# Patient Record
Sex: Male | Born: 1955 | Race: Black or African American | Hispanic: No | Marital: Single | Smoking: Current every day smoker
Health system: Southern US, Community
[De-identification: ages and names within clinical notes are randomized; demographics above are authoritative.]

## PROBLEM LIST (undated history)

## (undated) DIAGNOSIS — I10 Essential (primary) hypertension: Secondary | ICD-10-CM

## (undated) DIAGNOSIS — R0600 Dyspnea, unspecified: Secondary | ICD-10-CM

## (undated) DIAGNOSIS — E785 Hyperlipidemia, unspecified: Secondary | ICD-10-CM

## (undated) HISTORY — DX: Hyperlipidemia, unspecified: E78.5

## (undated) HISTORY — DX: Essential (primary) hypertension: I10

## (undated) HISTORY — PX: SPINE SURGERY: SHX786

---

## 1998-03-29 ENCOUNTER — Emergency Department (HOSPITAL_COMMUNITY): Admission: EM | Admit: 1998-03-29 | Discharge: 1998-03-29 | Payer: Self-pay | Admitting: Emergency Medicine

## 1998-04-09 ENCOUNTER — Ambulatory Visit (HOSPITAL_COMMUNITY): Admission: RE | Admit: 1998-04-09 | Discharge: 1998-04-09 | Payer: Self-pay | Admitting: Urology

## 1998-10-30 ENCOUNTER — Encounter: Admission: RE | Admit: 1998-10-30 | Discharge: 1999-01-28 | Payer: Self-pay | Admitting: Emergency Medicine

## 2002-07-03 ENCOUNTER — Emergency Department (HOSPITAL_COMMUNITY): Admission: EM | Admit: 2002-07-03 | Discharge: 2002-07-04 | Payer: Self-pay | Admitting: Emergency Medicine

## 2004-05-08 ENCOUNTER — Inpatient Hospital Stay (HOSPITAL_COMMUNITY): Admission: EM | Admit: 2004-05-08 | Discharge: 2004-05-09 | Payer: Self-pay | Admitting: Emergency Medicine

## 2004-05-15 ENCOUNTER — Encounter: Admission: RE | Admit: 2004-05-15 | Discharge: 2004-05-15 | Payer: Self-pay | Admitting: Family Medicine

## 2005-09-05 ENCOUNTER — Ambulatory Visit: Payer: Self-pay | Admitting: Gastroenterology

## 2005-10-06 ENCOUNTER — Ambulatory Visit: Payer: Self-pay | Admitting: Gastroenterology

## 2005-10-13 ENCOUNTER — Ambulatory Visit: Payer: Self-pay | Admitting: Gastroenterology

## 2005-10-13 ENCOUNTER — Encounter (INDEPENDENT_AMBULATORY_CARE_PROVIDER_SITE_OTHER): Payer: Self-pay | Admitting: Specialist

## 2008-06-07 ENCOUNTER — Emergency Department (HOSPITAL_COMMUNITY): Admission: EM | Admit: 2008-06-07 | Discharge: 2008-06-07 | Payer: Self-pay | Admitting: Emergency Medicine

## 2008-06-16 ENCOUNTER — Encounter: Admission: RE | Admit: 2008-06-16 | Discharge: 2008-06-16 | Payer: Self-pay | Admitting: Emergency Medicine

## 2008-07-16 ENCOUNTER — Encounter: Admission: RE | Admit: 2008-07-16 | Discharge: 2008-07-16 | Payer: Self-pay | Admitting: Orthopedic Surgery

## 2008-08-02 ENCOUNTER — Encounter: Admission: RE | Admit: 2008-08-02 | Discharge: 2008-08-02 | Payer: Self-pay | Admitting: Emergency Medicine

## 2008-08-07 ENCOUNTER — Encounter: Admission: RE | Admit: 2008-08-07 | Discharge: 2008-08-07 | Payer: Self-pay | Admitting: Orthopedic Surgery

## 2008-08-08 ENCOUNTER — Encounter (INDEPENDENT_AMBULATORY_CARE_PROVIDER_SITE_OTHER): Payer: Self-pay | Admitting: Orthopedic Surgery

## 2008-08-08 ENCOUNTER — Inpatient Hospital Stay (HOSPITAL_COMMUNITY): Admission: RE | Admit: 2008-08-08 | Discharge: 2008-08-09 | Payer: Self-pay | Admitting: Orthopedic Surgery

## 2008-10-31 ENCOUNTER — Encounter: Admission: RE | Admit: 2008-10-31 | Discharge: 2008-10-31 | Payer: Self-pay | Admitting: Orthopedic Surgery

## 2008-12-01 ENCOUNTER — Encounter: Admission: RE | Admit: 2008-12-01 | Discharge: 2008-12-01 | Payer: Self-pay | Admitting: Orthopedic Surgery

## 2009-01-19 ENCOUNTER — Ambulatory Visit (HOSPITAL_BASED_OUTPATIENT_CLINIC_OR_DEPARTMENT_OTHER): Admission: RE | Admit: 2009-01-19 | Discharge: 2009-01-19 | Payer: Self-pay | Admitting: Orthopedic Surgery

## 2009-12-04 ENCOUNTER — Encounter (INDEPENDENT_AMBULATORY_CARE_PROVIDER_SITE_OTHER): Payer: Self-pay | Admitting: *Deleted

## 2011-04-07 LAB — BASIC METABOLIC PANEL
CO2: 25 mEq/L (ref 19–32)
Chloride: 107 mEq/L (ref 96–112)
GFR calc Af Amer: >60 mL/min (ref >60)
GFR calc non Af Amer: >60 mL/min (ref >60)
Potassium: 3.8 mEq/L (ref 3.5–5.1)

## 2011-04-07 LAB — GLUCOSE, CAPILLARY: Glucose-Capillary: 99 mg/dL (ref 70–99)

## 2011-05-06 NOTE — Op Note (Signed)
NAMEDIAZ, CRAGO NO.:  [id number]   MEDICAL RECORD NO.:  [id number]          PATIENT TYPE:  INP   LOCATION:  5012                         FACILITY:  [facility]   PHYSICIAN:  Nelda Severe, MD      DATE OF BIRTH:  [birth year]   DATE OF PROCEDURE:  08/08/2008  DATE OF DISCHARGE:                               OPERATIVE REPORT   SURGEON:  Nelda Severe, MD   ASSISTANT:  Lianne Cure, PA-C   PREOPERATIVE DIAGNOSIS:  Cervical myelopathy secondary to C5-C6 stenosis  and disk herniation.   POSTOPERATIVE DIAGNOSIS:  Cervical myelopathy secondary to C5-C6  stenosis and disk herniation.   OPERATIVE PROCEDURE:  Anterior cervical decompression and fusion using  PEEK cage, titanium plate, and screws with a right anterior iliac crest  graft harvest.   OPERATIVE NOTE:  The patient was placed under general endotracheal  anesthesia.  Sequential compression leggings were placed on both lower  extremities.  He was positioned with his arms at his sides supported on  armboards parallel to the table because of his immense size.  The gel  bump was placed under the shoulders and the head supported on a foam  donut.  The superior aspect of both shoulders was painted with tincture  of Benzoin.  A 4-inch adhesive tape was then used to tract on both the  shoulders and the adhesive tape was anchored to the foot of the  operating table.  A skin marker was used to mark the site of proposed  right anterior iliac crest graft harvest incision as well as the  proposed site of the cervical incision.  A 18 gauge spinal needle was  taped to the skin and a cross-table lateral radiograph taken which  showed that the mark was approximately one segment too high.   The right iliac crest was prepped with DuraPrep as was the anterior  cervical spine.  Square draping was applied and secured with Ioban.   A time-out was held in which all of the centering information was  repeated and confirmed etc.,  etc., etc.   A transverse incision was made from the midline to the anterior border  of the left sternocleidomastoid.  Dissection was carried through the  platysma with cutting current.  The anterior border of the  sternocleidomastoid muscle was identified.  We then bluntly dissected  the deep cervical fascia and then onto the anterior aspect of the spinal  column.  The C5-C6 level was identified and confirmed with a cross-table  lateral radiograph with a marker in the disk.   The anterior spondylophytes were removed as well as a large area of  heterotopic ossification in the annulus anteriorly.  The longus coli  muscles were mobilized bilaterally.  A shadow line retractor was placed.  Caspar distraction pins were then placed above and below and the  distractor applied.  The anterior disk was incised with a 15 blade.  We  then performed disk excision using various Karlin curettes and pituitary  rongeurs.   A high-speed bur was then used to bur off some of the anterior lip on  the superior vertebra (C5) in order to improve exposure.  A high-speed  bur was then used to bur posteriorly, particularly on the posterior  superior corner of C6.  There was a lot of very adherent disk tissue.  It was very difficult to separate or rasp the bone.  Ultimately, we did  and there was really no distinct plane between the posterior  longitudinal ligament and outer annulus.  We decompressed from right to  left, above and below.  We persisted with decompression until palpation  of the neural foramina on either side most consistent with there being  no residual compression.  In the course of all exposure, we had already  curetted away the endplate cartilage above and below.   Ultimately, we curettaged a 10-mm thick PEEK cage.   We then approached the anterior iliac crest on the right side, a  distance of at least 2 inches posterior to the anterior superior iliac  spine.  Insertion of the gluteal and  external oblique fascia were  removed from the superior surface of the crest using cautery.  The crest  was then fenestrated with a gouge.  Using a gouge and curettes, we  removed an adequate amount of bone to fill the cage.  The bony defect  was then packed with Gelfoam to control bleeding.   The cage was loaded with bone and gently impacted into place.  It was  countersunk minimally.  It was a lordotic cage.   Titanium plate and screws were then used to internally fix C5-C6.  Cross-  table lateral radiograph showed the correct level and satisfactory  placement of implants.  The cervical wound was then irrigated with  saline.  A 7-gauge Silastic drain was then placed in the depths of the  cervical wound and brought out through the skin medially.  The platysma  was reapposed using inverted 2-0 interrupted Vicryl sutures.  The  subcutaneous layer was not closed separately.  The skin was closed using  continuous 3-0 undyed Vicryl.  The subcutaneous tissue of the iliac  crest wound was closed using interrupted 2-0 Vicryl and the skin closed  using a running 3-0 undyed Vicryl in subcuticular fashion.  Skin edges  of both wounds were reinforced with Steri-Strips.  A gauze dressing was  placed and secured with a green towel on the cervical wound.  A  antibiotic ointment/gauze dressing was placed on the iliac crest wound  and secured with OpSite.   The patient has not been yet fully awakened at the time of dictation.  There were no intraoperative complications.  The sponge and needle  counts were correct.  Blood loss estimated at less than 200 mL.      Nelda Severe, MD  Electronically Signed     MT/MEDQ  D:  08/08/2008  T:  08/09/2008  Job:  [id number]

## 2011-05-06 NOTE — Op Note (Signed)
NAMEKENYATTA, [name]               ACCOUNT NO.:  [id number]   MEDICAL RECORD NO.:  [id number]          PATIENT TYPE:  AMB   LOCATION:  DSC                          FACILITY:  [facility]   PHYSICIAN:  Katy Fitch. Sypher, M.D. 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   POSTOPERATIVE DIAGNOSIS:  Entrapment neuropathy, median nerve left  carpal tunnel with background insulin-dependent diabetes mellitus.   OPERATION:  Release of left transverse carpal ligament.   OPERATING SURGEON:  Katy Fitch. Sypher, MD   ASSISTANT:  Marveen Reeks Dasnoit, PA-C   ANESTHESIA:  General by LMA.   SUPERVISING ANESTHESIOLOGIST:  Burna Forts, MD   INDICATIONS:  [name] is a 55 year old gentleman referred through  the courtesy of Dr. Nelda Severe and Dr. Earl Lites.  He has a history  of significant spinal arthrosis and is on chronic disability.  He has  insulin-dependent diabetes and history of hand numbness.  He was  thoroughly evaluated by Dr. Alveda Reasons and had electrodiagnostic studies  confirming bilateral carpal tunnel syndrome.   Due to a failure to respond to nonoperative measures, he is brought to  the operating room at this time for release of his left transverse  carpal ligament.   PROCEDURE IN DETAIL:  [name] is brought to the operating room and  placed in supine position upon the operating table.   Following the induction of general anesthesia by LMA technique, the left  arm was prepped with Betadine soap and solution and sterilely draped.  A  pneumatic tourniquet was applied to the proximal left brachium.  Upon  exsanguination of the left arm with Esmarch bandage, arterial tourniquet  was inflated to 220 mmHg.  Procedure commenced with short incision in  line of  the ring finger in the palm.  Subcutaneous tissues were  carefully divided up to the palmar fascia.  This was split  longitudinally toward the common sensory branch of the median nerve.  These were followed back to transverse carpal ligament which was gently  isolated from the median nerve.  The ligament was then released from its  ulnar border extending into the distal forearm.  This widely opened the  carpal canal.  Bleeding points along the margin of the ligament were not  problematic.  The carpal canal was explored.  There were fibrotic bands between the median nerve and the ulnar bursa.  These were gently released with scissors dissection.  Thereafter, the  wound was then repaired with intradermal 3-0 Prolene.  A compressive  dressing was applied.  Lidocaine 2% was infiltrated for postop  analgesia.  There were no apparent complications.      Katy Fitch Sypher, M.D.  Electronically Signed     RVS/MEDQ  D:  01/19/2009  T:  01/19/2009  Job:  [id number]   cc:   Nelda Severe, MD  Stan Head. Cleta Alberts, M.D.

## 2011-05-09 NOTE — Discharge Summary (Signed)
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      Nelda Severe, MD  Electronically Signed     MT/MEDQ  D:  09/20/2008  T:  09/21/2008  Job:  [id number]

## 2011-05-09 NOTE — Discharge Summary (Signed)
[name], [name]                           ACCOUNT NO.:  [id number]   MEDICAL RECORD NO.:  [id number]                   PATIENT TYPE:  INP   LOCATION:  5727                                 FACILITY:  [facility]   PHYSICIAN:  [name], M.D.             DATE OF BIRTH:  October 19, 1956   DATE OF ADMISSION:  05/08/2004  DATE OF DISCHARGE:  05/09/2004                                 DISCHARGE SUMMARY   DISCHARGE DIAGNOSES:  1. Diabetes mellitus.  2. Toothache secondary to tooth abscess.   DISCHARGE MEDICATIONS:  1. NPH 10 units subcutaneous in a.m. 30 minutes before breakfast and 5 units     subcutaneous in p.m. 30 minutes before dinner.  2. Metformin 850 mg p.o. daily.  3. Penicillin VK 500 mg p.o. 3 times a day for 7 days.  4. Tylenol 600 mg p.o. every 4-6 hours as necessary for toothache.   DISPOSITION:  The patient was discharged to home.   DISCHARGE INSTRUCTIONS:  1. Diet:  ADA carbohydrate-modified diabetic diet.  2. BP monitoring.  3. CBG monitoring before meals and at bedtime and record in notebook.   FOLLOWUP:  1. Follow up with Dr. Stan Head. Kane on June 04, 2004 at 10:20 a.m.  2. [facility] as scheduled.  3. Follow up with dentist of choice regarding toothache/tooth abscess.   REVIEW OF HISTORY:  This is a 55 year old Afro-American male with complaints  of right flank pain and abdominal pain and anorexia.  The patient is a known  diabetic and admitted that he has not taken his diabetes medications in the  past 9 months.   PHYSICAL EXAMINATION:  VITAL SIGNS:  On physical examination, blood pressure  was 160/110, pulse rate of 110-115, RR of 24, afebrile, O2 saturation of 95%  on room air.  GENERAL:  This is an obese, middle-aged male who ambulates with a cane but  without great difficulty.  HEART AND LUNGS:  Heart and lung findings were essentially normal.  ABDOMEN:  There was note of minimal CVA tenderness bilaterally.   There was  note of diffuse tenderness to palpation at the right upper quadrant and the  right flank with normoactive bowel sounds, with no distention of the  abdomen.  NEUROLOGIC:  Examination showed no lateralizing signs.   LABORATORY DATA ON ADMISSION:  Hemoglobin 15.7.  Sodium 131, potassium 4.8,  chloride 99, CO2 17, glucose 324, BUN 22, creatinine 1.7, total bilirubin  1.9, alkaline phosphatase 128, AST 18, ALT 20, total protein of 7.9, albumin  4.0, calcium 9.4, lipase 40.  Urinalysis showed specific gravity of 1.038,  glucose of greater than 1000, large bilirubin, more than 80 ketones, trace  blood, 30 of protein, nitrite and leukocyte esterase negative.  Urine drug  screen was positive for THC.  Serum osmolality was 289.  Serum ketones  showed small amount of acetone present.  CT without contrast of abdomen:  __________  stone.  Normal exam except for  possible diverticula noted.  No diverticulitis noted.   ABG showed a pH of 7.33, PCO2 of 28.7, bicarb of 15 and a base deficit of 9.   COURSE AT THE HOSPITAL:  PROBLEM #1 - DIABETES MELLITUS:  The patient was  given 1 L bolus of normal saline solution.  The patient was then started on  Novolog 70/30 -- 10 units x1, followed by Lantus 10 units subcutaneous in  a.m.  The patient's CBGs were fairly stable.  The patient was advised to  start NPH as instructed at home and to monitor CBGs before meals and at  bedtime.  His primary M.D. needs to adjust his insulin regimen, depending on  his CBGs.  The patient also underwent diabetic teaching and was instructed  on proper use of the insulin syringes and how to inject this insulin.   PROBLEM #2 - HYPERTENSION:  The patient does not have a history of  hypertension.  It was unclear whether this was secondary to pain.  The  patient was advised to monitor his blood pressure at home and if his blood  pressure remains persistently elevated, he is to return to his primary M.D.  so that  antihypertensive medication can be started if needed.   PROBLEM #3 - TOOTHACHE, RULE OUT ABSCESS:  The patient was started on  penicillin VK and was advised to follow up with his dentist of choice  regarding the toothache and possible tooth abscess.   PROBLEM #4 - SUBSTANCE ABUSE:  The patient was referred to the care manager  regarding substance abuse issues.   CONDITION ON DISCHARGE:  The patient was discharged with no complaints  except for a toothache which was controlled with analgesics.  He did not  complain of any abdominal pain, nausea or vomiting.  He was tolerating his  diabetic diet and was ready to go home.  Blood pressure was 153/73 with a  pulse rate of 83-102, afebrile, with O2 saturations of 95% to 98% on room  air.  Fasting blood sugar was 213.  The rest of the physical exam was  essentially normal.  The patient was advised to follow up with his primary  M.D. so that adjustments in his insulin regimen can be done, depending on  his CBG monitoring at home.      Walter Sabal, MD                         [name], M.D.    MC/MEDQ  D:  05/22/2004  T:  05/23/2004  Job:  [id number]   cc:   [name], M.D.  [address]  [place]  [place] [address]  Fax: [phone]

## 2011-05-09 NOTE — Discharge Summary (Signed)
NAMEBECKER, [name] NO.:  [id number]   MEDICAL RECORD NO.:  [id number]          PATIENT TYPE:  INP   LOCATION:  5012                         FACILITY:  [facility]   PHYSICIAN:  Nelda Severe, MD      DATE OF BIRTH:  1956/07/28   DATE OF ADMISSION:  08/08/2008  DATE OF DISCHARGE:  08/09/2008                               DISCHARGE SUMMARY   ADMITTING DIAGNOSIS:  C5-6 stenosis with myelopathy.   BRIEF HISTORY:  He was admitted to [facility] on August 08, 2008, for anterior decompression fusion C5-6 with iliac crest bone graft  on the right side.  Surgery was uneventful.  He had less than 200 mL of  blood loss.  He was grossly neurovascularly and motor intact.  He had  good grip strength.  Sensation was increased slightly postoperatively.  Postop day #1, on August 09, 2008, the patient states his hip is  slightly sore from taking the bone graft, but his hands feels better.  He has slight discomfort with swallowing, but he is swallowing okay.  He  is afebrile.  Vital signs were stable.  The drain was discontinued.  A  dry dressing was placed over the drain hole, has to be in place for 24  hours prior to showering.  Hip dressing was discontinued.  The incision  on the right hip was clean, dry, and intact.  No active drainage.  No  erythema.  No sign of infection.   DIAGNOSIS:  Cervical stenosis C5-6.   PLAN OF CARE:  Dry dressing until no drainage.  Norco 10 one to two  p.r.n. for pain control a prescription was given to the patient.  He may  shower in 24 hours.  Followup in our office in approximately 1 week.  Soft diet for 6 days.   DISPOSITION:  Stable.      Lianne Cure, P.A.      Nelda Severe, MD  Electronically Signed    MC/MEDQ  D:  09/14/2008  T:  09/15/2008  Job:  [id number]

## 2011-05-09 NOTE — H&P (Signed)
[name], GAMINO                           ACCOUNT NO.:  [id number]   MEDICAL RECORD NO.:  [id number]                   PATIENT TYPE:  OBV   LOCATION:  5727                                 FACILITY:  [facility]   PHYSICIAN:  Franklyn Lor, MD                      DATE OF BIRTH:  [birth year]   DATE OF ADMISSION:  05/07/2004  DATE OF DISCHARGE:                                HISTORY & PHYSICAL   CHIEF COMPLAINT:  Right flank pain.   HISTORY OF PRESENTING ILLNESS:  This is a 55 year old African American male,  sent to the [facility] for  complaint of right flank pain times approximately five days.  The patient  states that approximately five days ago, his abdomen and side began to ache.  His appetite decreased.  He experienced some, what he calls, bubbling in  his stomach and a general sense of malaise.  The [facility] reported [facility] at which time he was afebrile with a temperature of 98.3,  hypertensive at 160/100, and tachycardic at 115.  At that time, he had a  urinalysis that was positive for moderate bilirubin, greater than 80  ketones, and 100 protein with 10-15 red cells.  The patient had a history of  right flank pain in the past and there was concern that he may have renal  stone, based on his presentation of hematuria and a slightly increased white  count of 10.5.  The patient was sent to the [facility] Emergency Department  at which time we evaluated him.  At that time, the patient denied alcohol  use or drug use otherwise.  He did admit to a sense of cramping in his right  lower extremity on an intermittent basis.  He complained of a diffuse  headache that was throbbing in nature and intermittent that responded to  over the counter pain medication.  He also complained of an increase in  blurry vision over the last 4-5 days.  He admitted that he had not taken his  diabetes medicines in approximately nine months.   PAST MEDICAL HISTORY:  1. Diabetes, type 2, poorly controlled.  2. Remote crush injury to left hip and leg for which the patient has     hardware on his left hip.  3. History of DVT, status post [place] filter placement.  4. Hypercholesterolemia.  5. Obesity.  6. Tobacco abuse.   FAMILY HISTORY:  The patient has two brothers.  Father died in his 44s.  Mother is alive and has diabetes and hypertension.  Two brothers have  diabetes and hypertension.   SOCIAL HISTORY:  The patient is on life long disability status post crush  injury to left lower extremity.  I do not know that he lives with his Mom,  but she certainly provides for his transportation needs and was present  at  the exam.  Again, the patient admits to remote history of alcohol use,  stating that he quit a year ago.  He admits to 20 pack year history of  smoking, currently smoking half a pack a day.  Denies recreational drug use.   PHYSICAL EXAMINATION:  VITAL SIGNS:  The patient was afebrile with an  elevated blood pressure of initially 160 over the one teens but then 155/83.  He was tachycardic between 110-115 beats per minute.  He was not tachypneic.  His O2 saturation was high 90s on room air.  GENERAL:  This is an obese, African American, middle-aged male who ambulates  with a cane but without great difficulty.  He was in minimal distress if any  and quite active during the interview.  HEENT:  TMs clear bilaterally.  EOMI.  PERRL.  MMM.  No thyromegaly.  No  lymphadenopathy.  Questionable mild scleral icterus bilaterally.  NECK:  Supple but obese.  No carotid bruits appreciated.  CHEST:  Tachy.  No murmurs, rubs or gallops.  Regular rhythm.  LUNGS:  Clear to auscultation bilaterally, although breath sounds were faint  secondary to body habitus.  The patient had pendulous breasts that were  symmetric, nontender.  Minimal CVA tenderness bilaterally.  ABDOMEN:  Diffuse tenderness to palpation with the right upper  quadrant  being the most sensitive.  The patient also had moderate tenderness to  palpation of the right flank.  No obvious ecchymosis.  No varices.  Positive  bowel sounds.  Nondistended.  EXTREMITIES:  Musculoskeletal strength 5/5 upper and lower extremities  bilaterally.  Bounding 3+ radial pulses bilaterally.  Pedal pulses 1+  bilaterally.  No peripheral edema.  Sharp and dull brick sensation intact at  the ankles bilaterally and at the wrists bilaterally with slightly  diminished sensation on the right lateral ankle.  No ulcers visualized.  RECTAL:  Deferred secondary to the patient having just had one [facility].  NEURO:  Cranial nerves II-XII intact.  The patient alert and oriented  x 3.  The patient passed finger-to-nose test and rapid alternating movement test.   LABORATORY DATA:  Sodium 131, potassium 4.8, chloride 99, CO2 17, glucose  324, BUN 22, creatinine 1.7.  Total bili 1.9, alk phos 128, AST 18, ALT 20,  total protein 7.9, albumin 4.0, calcium 9.4.  Lipase 40.  Cath UA showed a  specific gravity of 1.038, glucose greater than 1,000, bilirubin large,  greater than 80 ketones, trace blood, 30 of protein, nitrite and leukocyte  esterase negative.  Urine micro showed a few squamous cells, 0-2 white  cells, 0-2 red cells, a few bacteria.  I-STAT-8 showed a pH of 7.33, pCO2 of  28.7, bicarb of 15, base deficit of 9.  Hemoglobin 15.7, sodium 132,  potassium 4.3, chloride 104, glucose 265, BUN 22.  Urine drug screen was  positive for THC.  Serum osmolality 289 measured which is roughly equal to  calculated.  Serum ketones a small amount of blood acetone present.   CT without contrast of the abdomen and pelvis failed to show stones; showed  normal appendix, normal gallbladder, and grossly normal-appearing abdominal  CT otherwise with the exception of positive diverticula noted.  No  diverticulitis noted.  ASSESSMENT:  This is a 55 year old African American  male poorly controlled  diabetes, type 2 who presents with abdominal/flank pain and is found to have  anion gap acidosis.   PLAN:  1. Hyperglycemia.  Whether this  is hyperosmolar nonketotic hyperglycemia or     early diabetic ketoacidosis is difficult to discern, but it is clear,     based on the patient's history, and his laboratory data, that he does     have at least a moderate amount of dehydration that has resulted in     bilirubin in his urine and a mild amount of ketoacidosis.  Plan to     observe the patient for 23 hours with aggressive intravenous fluid     rehydration and establishment of long term insulin regimen, so that we     may improve this patient's diabetic control.  Plan to consult diabetes     educator.  2. Increased liver function tests.  Only minimally increased alkaline     phosphatase and total bilirubin, easily attributable to dehydration and     diabetic ketoacidosis.  Differential includes Gilbert's disease which     would most likely require ingestion of alcohol.  On further questioning,     the patient does admit to consumption of approximately 40 ounces of malt     liquor 5-6 days ago.  I doubt that that amount of consumption could     linger to create this presentation for that long in a Gilbert's syndrome.     The patient's complete blood count is not consistent with a hemolytic     anemia and at the time there is no fractionated bilirubin anyway.     Computed tomography negative for appendicitis, cholecystitis, or     pancreatitis.  Check hepatic function panel, replete with intravenous     fluids.  3. Abdominal/flank pain.  Again, no evidence on workup for etiology,     therefore, most likely scenario is that this is typical abdominal     discomfort and general malaise associated with hyperglycemia and     potentially diabetic ketoacidosis.  We will, again, replete with     intravenous fluids and follow the patient's symptoms with regard to pain.      He has not been constipated, nor has he had diarrhea.  The patient's     flank pain could be due to pyelonephritis, but his urinalysis, while it     is clearly abnormal, is not consistent with bacteria infection     necessarily.  The patient afebrile at the time of presentation, although     he does vaguely endorse some CVA tenderness and ____________ suspicion is     not high at this time.  Additional sources of right flank pain include     those listed in the paragraph above, but again computed tomography scan     helped to rule these out.   DISPOSITION:  Pending advise from diabetes educator and initiation of a  financially feasible medical regimen for this noncompliant patient.                                                Franklyn Lor, MD    TD/MEDQ  D:  05/08/2004  T:  05/08/2004  Job:  [id number]

## 2011-09-18 LAB — DIFFERENTIAL
Basophils Relative: 1
Eosinophils Absolute: 0.1
Lymphocytes Relative: 22
Lymphs Abs: 1.5
Neutrophils Relative %: 68

## 2011-09-18 LAB — BASIC METABOLIC PANEL
GFR calc non Af Amer: >60
Glucose, Bld: 102 — ABNORMAL HIGH
Potassium: 4.7
Sodium: 137

## 2011-09-18 LAB — SEDIMENTATION RATE: Sed Rate: 28 — ABNORMAL HIGH

## 2011-09-18 LAB — CBC
HCT: 36.3 — ABNORMAL LOW
Hemoglobin: 12.8 — ABNORMAL LOW

## 2012-01-28 ENCOUNTER — Ambulatory Visit (INDEPENDENT_AMBULATORY_CARE_PROVIDER_SITE_OTHER): Payer: PRIVATE HEALTH INSURANCE | Admitting: Emergency Medicine

## 2012-01-28 DIAGNOSIS — E7800 Pure hypercholesterolemia, unspecified: Secondary | ICD-10-CM

## 2012-01-28 DIAGNOSIS — E1165 Type 2 diabetes mellitus with hyperglycemia: Secondary | ICD-10-CM | POA: Insufficient documentation

## 2012-01-28 DIAGNOSIS — E119 Type 2 diabetes mellitus without complications: Secondary | ICD-10-CM

## 2012-01-28 DIAGNOSIS — E785 Hyperlipidemia, unspecified: Secondary | ICD-10-CM

## 2012-01-28 DIAGNOSIS — I10 Essential (primary) hypertension: Secondary | ICD-10-CM

## 2012-01-28 LAB — POCT CBC
Granulocyte percent: 57 %G (ref 37–80)
MCH, POC: 27.9 pg (ref 27–31.2)
MID (cbc): 0.7 (ref 0–0.9)
MPV: 8.3 fL (ref 0–99.8)
POC Granulocyte: 4.8 (ref 2–6.9)
POC MID %: 8 %M (ref 0–12)
Platelet Count, POC: 313 10*3/uL (ref 142–424)
RBC: 4.77 M/uL (ref 4.69–6.13)

## 2012-01-28 LAB — GLUCOSE, POCT (MANUAL RESULT ENTRY): POC Glucose: 132

## 2012-01-28 LAB — POCT GLYCOSYLATED HEMOGLOBIN (HGB A1C): Hemoglobin A1C: 6.7

## 2012-01-28 MED ORDER — LOSARTAN POTASSIUM 50 MG PO TABS: 50.0000 mg | ORAL_TABLET | Freq: Every day | ORAL | Status: DC

## 2012-01-28 MED ORDER — INSULIN NPH (HUMAN) (ISOPHANE) 100 UNIT/ML ~~LOC~~ SUSP: 15.0000 [IU] | SUBCUTANEOUS | Status: DC

## 2012-01-28 NOTE — Progress Notes (Signed)
  Subjective:    Patient ID: [name], male    DOB: 10/18/1956, 56 y.o.   MRN: [id number]  HPI patient enters for recheck of his diabetes he states he has been doing well he has no specific complaints and has kept his sugars under pretty good control. He did have to increase his insulin to 15 units a day    Review of Systems patient has not been in to see his eye doctor but otherwise is up-to-date on his normal screening procedures     Objective:   Physical Exam his HEENT exam is unremarkable his chest is clear to observation and percussion heart regular rate no murmurs        Assessment & Plan:  Repeat blood pressure was done was 100/70 his hemoglobin A1c and glucose are in range. We'll not make any changes in her medications. Mucomyst was started and NPH insulin.

## 2012-01-29 LAB — COMPREHENSIVE METABOLIC PANEL
Alkaline Phosphatase: 123 U/L — ABNORMAL HIGH (ref 39–117)
Glucose, Bld: 115 mg/dL — ABNORMAL HIGH (ref 70–99)
Sodium: 136 mEq/L (ref 135–145)
Total Bilirubin: 0.5 mg/dL (ref 0.3–1.2)
Total Protein: 7.5 g/dL (ref 6.0–8.3)

## 2012-01-29 LAB — LIPID PANEL
Cholesterol: 187 mg/dL (ref 0–200)
Total CHOL/HDL Ratio: 3.9 Ratio
VLDL: 10 mg/dL (ref 0–40)

## 2012-06-16 ENCOUNTER — Other Ambulatory Visit: Payer: Self-pay | Admitting: Emergency Medicine

## 2013-02-10 ENCOUNTER — Other Ambulatory Visit: Payer: Self-pay | Admitting: Emergency Medicine

## 2013-02-11 NOTE — Telephone Encounter (Signed)
Must have office visit, not seen in over 1 year

## 2013-04-09 ENCOUNTER — Other Ambulatory Visit: Payer: Self-pay | Admitting: Physician Assistant

## 2013-11-13 ENCOUNTER — Telehealth: Payer: Self-pay | Admitting: *Deleted

## 2013-11-13 NOTE — Telephone Encounter (Signed)
Left message for patient to please call and schedule Comprehensive diabetes care screenings for LDL and kidney function blood tests.

## 2015-01-29 DIAGNOSIS — Z79891 Long term (current) use of opiate analgesic: Secondary | ICD-10-CM | POA: Diagnosis not present

## 2015-01-29 DIAGNOSIS — G894 Chronic pain syndrome: Secondary | ICD-10-CM | POA: Diagnosis not present

## 2015-01-29 DIAGNOSIS — M47812 Spondylosis without myelopathy or radiculopathy, cervical region: Secondary | ICD-10-CM | POA: Diagnosis not present

## 2015-01-29 DIAGNOSIS — M47816 Spondylosis without myelopathy or radiculopathy, lumbar region: Secondary | ICD-10-CM | POA: Diagnosis not present

## 2015-03-27 DIAGNOSIS — G894 Chronic pain syndrome: Secondary | ICD-10-CM | POA: Diagnosis not present

## 2015-03-27 DIAGNOSIS — M47816 Spondylosis without myelopathy or radiculopathy, lumbar region: Secondary | ICD-10-CM | POA: Diagnosis not present

## 2015-03-27 DIAGNOSIS — M47812 Spondylosis without myelopathy or radiculopathy, cervical region: Secondary | ICD-10-CM | POA: Diagnosis not present

## 2015-03-27 DIAGNOSIS — Z79891 Long term (current) use of opiate analgesic: Secondary | ICD-10-CM | POA: Diagnosis not present

## 2015-05-23 DIAGNOSIS — Z79891 Long term (current) use of opiate analgesic: Secondary | ICD-10-CM | POA: Diagnosis not present

## 2015-05-23 DIAGNOSIS — G894 Chronic pain syndrome: Secondary | ICD-10-CM | POA: Diagnosis not present

## 2015-05-23 DIAGNOSIS — M47812 Spondylosis without myelopathy or radiculopathy, cervical region: Secondary | ICD-10-CM | POA: Diagnosis not present

## 2015-05-23 DIAGNOSIS — M47816 Spondylosis without myelopathy or radiculopathy, lumbar region: Secondary | ICD-10-CM | POA: Diagnosis not present

## 2015-07-18 DIAGNOSIS — M47816 Spondylosis without myelopathy or radiculopathy, lumbar region: Secondary | ICD-10-CM | POA: Diagnosis not present

## 2015-07-18 DIAGNOSIS — G894 Chronic pain syndrome: Secondary | ICD-10-CM | POA: Diagnosis not present

## 2015-07-18 DIAGNOSIS — Z79891 Long term (current) use of opiate analgesic: Secondary | ICD-10-CM | POA: Diagnosis not present

## 2015-07-18 DIAGNOSIS — M47812 Spondylosis without myelopathy or radiculopathy, cervical region: Secondary | ICD-10-CM | POA: Diagnosis not present

## 2015-09-12 DIAGNOSIS — G894 Chronic pain syndrome: Secondary | ICD-10-CM | POA: Diagnosis not present

## 2015-09-12 DIAGNOSIS — Z79891 Long term (current) use of opiate analgesic: Secondary | ICD-10-CM | POA: Diagnosis not present

## 2015-09-12 DIAGNOSIS — M47812 Spondylosis without myelopathy or radiculopathy, cervical region: Secondary | ICD-10-CM | POA: Diagnosis not present

## 2015-09-12 DIAGNOSIS — M47816 Spondylosis without myelopathy or radiculopathy, lumbar region: Secondary | ICD-10-CM | POA: Diagnosis not present

## 2015-11-19 DIAGNOSIS — M47816 Spondylosis without myelopathy or radiculopathy, lumbar region: Secondary | ICD-10-CM | POA: Diagnosis not present

## 2015-11-19 DIAGNOSIS — M47812 Spondylosis without myelopathy or radiculopathy, cervical region: Secondary | ICD-10-CM | POA: Diagnosis not present

## 2015-11-19 DIAGNOSIS — G894 Chronic pain syndrome: Secondary | ICD-10-CM | POA: Diagnosis not present

## 2015-11-19 DIAGNOSIS — Z79891 Long term (current) use of opiate analgesic: Secondary | ICD-10-CM | POA: Diagnosis not present

## 2016-02-11 DIAGNOSIS — Z79891 Long term (current) use of opiate analgesic: Secondary | ICD-10-CM | POA: Diagnosis not present

## 2016-02-11 DIAGNOSIS — G894 Chronic pain syndrome: Secondary | ICD-10-CM | POA: Diagnosis not present

## 2016-02-11 DIAGNOSIS — M47816 Spondylosis without myelopathy or radiculopathy, lumbar region: Secondary | ICD-10-CM | POA: Diagnosis not present

## 2016-02-11 DIAGNOSIS — M47812 Spondylosis without myelopathy or radiculopathy, cervical region: Secondary | ICD-10-CM | POA: Diagnosis not present

## 2016-04-07 DIAGNOSIS — G894 Chronic pain syndrome: Secondary | ICD-10-CM | POA: Diagnosis not present

## 2016-04-07 DIAGNOSIS — M47816 Spondylosis without myelopathy or radiculopathy, lumbar region: Secondary | ICD-10-CM | POA: Diagnosis not present

## 2016-04-07 DIAGNOSIS — M47812 Spondylosis without myelopathy or radiculopathy, cervical region: Secondary | ICD-10-CM | POA: Diagnosis not present

## 2016-04-07 DIAGNOSIS — Z79891 Long term (current) use of opiate analgesic: Secondary | ICD-10-CM | POA: Diagnosis not present

## 2016-06-02 DIAGNOSIS — M47812 Spondylosis without myelopathy or radiculopathy, cervical region: Secondary | ICD-10-CM | POA: Diagnosis not present

## 2016-06-02 DIAGNOSIS — Z79891 Long term (current) use of opiate analgesic: Secondary | ICD-10-CM | POA: Diagnosis not present

## 2016-06-02 DIAGNOSIS — M47816 Spondylosis without myelopathy or radiculopathy, lumbar region: Secondary | ICD-10-CM | POA: Diagnosis not present

## 2016-06-02 DIAGNOSIS — G894 Chronic pain syndrome: Secondary | ICD-10-CM | POA: Diagnosis not present

## 2016-07-28 DIAGNOSIS — M47816 Spondylosis without myelopathy or radiculopathy, lumbar region: Secondary | ICD-10-CM | POA: Diagnosis not present

## 2016-07-28 DIAGNOSIS — Z79891 Long term (current) use of opiate analgesic: Secondary | ICD-10-CM | POA: Diagnosis not present

## 2016-07-28 DIAGNOSIS — G894 Chronic pain syndrome: Secondary | ICD-10-CM | POA: Diagnosis not present

## 2016-07-28 DIAGNOSIS — M47812 Spondylosis without myelopathy or radiculopathy, cervical region: Secondary | ICD-10-CM | POA: Diagnosis not present

## 2016-09-23 DIAGNOSIS — M47812 Spondylosis without myelopathy or radiculopathy, cervical region: Secondary | ICD-10-CM | POA: Diagnosis not present

## 2016-09-23 DIAGNOSIS — Z79891 Long term (current) use of opiate analgesic: Secondary | ICD-10-CM | POA: Diagnosis not present

## 2016-09-23 DIAGNOSIS — G894 Chronic pain syndrome: Secondary | ICD-10-CM | POA: Diagnosis not present

## 2016-09-23 DIAGNOSIS — M47816 Spondylosis without myelopathy or radiculopathy, lumbar region: Secondary | ICD-10-CM | POA: Diagnosis not present

## 2017-01-13 DIAGNOSIS — G894 Chronic pain syndrome: Secondary | ICD-10-CM | POA: Diagnosis not present

## 2017-01-13 DIAGNOSIS — M47812 Spondylosis without myelopathy or radiculopathy, cervical region: Secondary | ICD-10-CM | POA: Diagnosis not present

## 2017-01-13 DIAGNOSIS — M47816 Spondylosis without myelopathy or radiculopathy, lumbar region: Secondary | ICD-10-CM | POA: Diagnosis not present

## 2017-01-13 DIAGNOSIS — Z79891 Long term (current) use of opiate analgesic: Secondary | ICD-10-CM | POA: Diagnosis not present

## 2017-03-03 DIAGNOSIS — G894 Chronic pain syndrome: Secondary | ICD-10-CM | POA: Diagnosis not present

## 2017-03-03 DIAGNOSIS — M47812 Spondylosis without myelopathy or radiculopathy, cervical region: Secondary | ICD-10-CM | POA: Diagnosis not present

## 2017-03-03 DIAGNOSIS — Z79891 Long term (current) use of opiate analgesic: Secondary | ICD-10-CM | POA: Diagnosis not present

## 2017-03-03 DIAGNOSIS — M47816 Spondylosis without myelopathy or radiculopathy, lumbar region: Secondary | ICD-10-CM | POA: Diagnosis not present

## 2017-04-28 DIAGNOSIS — M47816 Spondylosis without myelopathy or radiculopathy, lumbar region: Secondary | ICD-10-CM | POA: Diagnosis not present

## 2017-04-28 DIAGNOSIS — Z79891 Long term (current) use of opiate analgesic: Secondary | ICD-10-CM | POA: Diagnosis not present

## 2017-04-28 DIAGNOSIS — M47812 Spondylosis without myelopathy or radiculopathy, cervical region: Secondary | ICD-10-CM | POA: Diagnosis not present

## 2017-04-28 DIAGNOSIS — G894 Chronic pain syndrome: Secondary | ICD-10-CM | POA: Diagnosis not present

## 2017-06-23 DIAGNOSIS — Z79891 Long term (current) use of opiate analgesic: Secondary | ICD-10-CM | POA: Diagnosis not present

## 2017-06-23 DIAGNOSIS — M47812 Spondylosis without myelopathy or radiculopathy, cervical region: Secondary | ICD-10-CM | POA: Diagnosis not present

## 2017-06-23 DIAGNOSIS — M47816 Spondylosis without myelopathy or radiculopathy, lumbar region: Secondary | ICD-10-CM | POA: Diagnosis not present

## 2017-06-23 DIAGNOSIS — G894 Chronic pain syndrome: Secondary | ICD-10-CM | POA: Diagnosis not present

## 2017-08-18 DIAGNOSIS — G894 Chronic pain syndrome: Secondary | ICD-10-CM | POA: Diagnosis not present

## 2017-08-18 DIAGNOSIS — M47812 Spondylosis without myelopathy or radiculopathy, cervical region: Secondary | ICD-10-CM | POA: Diagnosis not present

## 2017-08-18 DIAGNOSIS — Z79891 Long term (current) use of opiate analgesic: Secondary | ICD-10-CM | POA: Diagnosis not present

## 2017-08-18 DIAGNOSIS — M47816 Spondylosis without myelopathy or radiculopathy, lumbar region: Secondary | ICD-10-CM | POA: Diagnosis not present

## 2017-10-20 DIAGNOSIS — G894 Chronic pain syndrome: Secondary | ICD-10-CM | POA: Diagnosis not present

## 2017-10-20 DIAGNOSIS — M47816 Spondylosis without myelopathy or radiculopathy, lumbar region: Secondary | ICD-10-CM | POA: Diagnosis not present

## 2017-10-20 DIAGNOSIS — Z79891 Long term (current) use of opiate analgesic: Secondary | ICD-10-CM | POA: Diagnosis not present

## 2017-10-20 DIAGNOSIS — M47812 Spondylosis without myelopathy or radiculopathy, cervical region: Secondary | ICD-10-CM | POA: Diagnosis not present

## 2020-07-15 ENCOUNTER — Inpatient Hospital Stay (HOSPITAL_COMMUNITY)
Admission: EM | Admit: 2020-07-15 | Discharge: 2020-07-20 | DRG: 603 | Disposition: A | Discharge status: Skilled Nursing Facility | Payer: Medicare Other | Attending: Internal Medicine | Admitting: Internal Medicine

## 2020-07-15 ENCOUNTER — Encounter (HOSPITAL_COMMUNITY): Payer: Self-pay | Admitting: *Deleted

## 2020-07-15 ENCOUNTER — Emergency Department (HOSPITAL_COMMUNITY): Payer: Medicare Other

## 2020-07-15 ENCOUNTER — Other Ambulatory Visit: Payer: Self-pay

## 2020-07-15 DIAGNOSIS — Z9181 History of falling: Secondary | ICD-10-CM | POA: Diagnosis not present

## 2020-07-15 DIAGNOSIS — R3121 Asymptomatic microscopic hematuria: Secondary | ICD-10-CM | POA: Diagnosis present

## 2020-07-15 DIAGNOSIS — B9561 Methicillin susceptible Staphylococcus aureus infection as the cause of diseases classified elsewhere: Secondary | ICD-10-CM | POA: Diagnosis present

## 2020-07-15 DIAGNOSIS — R52 Pain, unspecified: Secondary | ICD-10-CM

## 2020-07-15 DIAGNOSIS — E119 Type 2 diabetes mellitus without complications: Secondary | ICD-10-CM

## 2020-07-15 DIAGNOSIS — Z823 Family history of stroke: Secondary | ICD-10-CM

## 2020-07-15 DIAGNOSIS — R319 Hematuria, unspecified: Secondary | ICD-10-CM

## 2020-07-15 DIAGNOSIS — E874 Mixed disorder of acid-base balance: Secondary | ICD-10-CM | POA: Diagnosis present

## 2020-07-15 DIAGNOSIS — G894 Chronic pain syndrome: Secondary | ICD-10-CM | POA: Diagnosis present

## 2020-07-15 DIAGNOSIS — R8271 Bacteriuria: Secondary | ICD-10-CM | POA: Diagnosis present

## 2020-07-15 DIAGNOSIS — D649 Anemia, unspecified: Secondary | ICD-10-CM | POA: Diagnosis present

## 2020-07-15 DIAGNOSIS — N179 Acute kidney failure, unspecified: Secondary | ICD-10-CM | POA: Diagnosis present

## 2020-07-15 DIAGNOSIS — Z20822 Contact with and (suspected) exposure to covid-19: Secondary | ICD-10-CM | POA: Diagnosis present

## 2020-07-15 DIAGNOSIS — M79609 Pain in unspecified limb: Secondary | ICD-10-CM | POA: Diagnosis not present

## 2020-07-15 DIAGNOSIS — R296 Repeated falls: Secondary | ICD-10-CM

## 2020-07-15 DIAGNOSIS — M549 Dorsalgia, unspecified: Secondary | ICD-10-CM

## 2020-07-15 DIAGNOSIS — E785 Hyperlipidemia, unspecified: Secondary | ICD-10-CM | POA: Diagnosis present

## 2020-07-15 DIAGNOSIS — E1141 Type 2 diabetes mellitus with diabetic mononeuropathy: Secondary | ICD-10-CM | POA: Diagnosis present

## 2020-07-15 DIAGNOSIS — F17210 Nicotine dependence, cigarettes, uncomplicated: Secondary | ICD-10-CM | POA: Diagnosis present

## 2020-07-15 DIAGNOSIS — L03116 Cellulitis of left lower limb: Secondary | ICD-10-CM | POA: Diagnosis not present

## 2020-07-15 DIAGNOSIS — G5792 Unspecified mononeuropathy of left lower limb: Secondary | ICD-10-CM | POA: Diagnosis present

## 2020-07-15 DIAGNOSIS — I10 Essential (primary) hypertension: Secondary | ICD-10-CM | POA: Diagnosis present

## 2020-07-15 DIAGNOSIS — R54 Age-related physical debility: Secondary | ICD-10-CM | POA: Diagnosis present

## 2020-07-15 DIAGNOSIS — M7989 Other specified soft tissue disorders: Secondary | ICD-10-CM | POA: Diagnosis not present

## 2020-07-15 DIAGNOSIS — G8929 Other chronic pain: Secondary | ICD-10-CM | POA: Diagnosis present

## 2020-07-15 DIAGNOSIS — R5381 Other malaise: Secondary | ICD-10-CM | POA: Diagnosis present

## 2020-07-15 DIAGNOSIS — E1165 Type 2 diabetes mellitus with hyperglycemia: Secondary | ICD-10-CM

## 2020-07-15 LAB — CBG MONITORING, ED
Glucose-Capillary: 164 mg/dL — ABNORMAL HIGH (ref 70–99)
Glucose-Capillary: 105 mg/dL — ABNORMAL HIGH (ref 70–99)

## 2020-07-15 LAB — URINALYSIS, ROUTINE W REFLEX MICROSCOPIC
Bilirubin Urine: NEGATIVE
Glucose, UA: NEGATIVE mg/dL
Ketones, ur: NEGATIVE mg/dL
Nitrite: NEGATIVE
Protein, ur: NEGATIVE mg/dL
Specific Gravity, Urine: >1.046 — ABNORMAL HIGH (ref 1.005–1.030)
WBC, UA: >50 WBC/hpf — ABNORMAL HIGH (ref 0–5)
pH: 5 (ref 5.0–8.0)

## 2020-07-15 LAB — CBC WITH DIFFERENTIAL/PLATELET
Abs Immature Granulocytes: 0.08 10*3/uL — ABNORMAL HIGH (ref 0.00–0.07)
Basophils Absolute: 0.1 10*3/uL (ref 0.0–0.1)
Basophils Relative: 1 %
Eosinophils Absolute: 0 10*3/uL (ref 0.0–0.5)
Eosinophils Relative: 0 %
HCT: 37.5 % — ABNORMAL LOW (ref 39.0–52.0)
Hemoglobin: 12.1 g/dL — ABNORMAL LOW (ref 13.0–17.0)
Immature Granulocytes: 1 %
Lymphocytes Relative: 11 %
Lymphs Abs: 1.5 10*3/uL (ref 0.7–4.0)
MCH: 30.4 pg (ref 26.0–34.0)
MCHC: 32.3 g/dL (ref 30.0–36.0)
MCV: 94.2 fL (ref 80.0–100.0)
Monocytes Absolute: 0.8 10*3/uL (ref 0.1–1.0)
Monocytes Relative: 6 %
Neutro Abs: 11.6 10*3/uL — ABNORMAL HIGH (ref 1.7–7.7)
Neutrophils Relative %: 81 %
Platelets: 246 10*3/uL (ref 150–400)
RBC: 3.98 MIL/uL — ABNORMAL LOW (ref 4.22–5.81)
RDW: 13.9 % (ref 11.5–15.5)
WBC: 14.1 10*3/uL — ABNORMAL HIGH (ref 4.0–10.5)
nRBC: 0 % (ref 0.0–0.2)

## 2020-07-15 LAB — HIV ANTIBODY (ROUTINE TESTING W REFLEX): HIV Screen 4th Generation wRfx: NONREACTIVE

## 2020-07-15 LAB — COMPREHENSIVE METABOLIC PANEL
ALT: 27 U/L (ref 0–44)
AST: 29 U/L (ref 15–41)
Albumin: 2.5 g/dL — ABNORMAL LOW (ref 3.5–5.0)
Alkaline Phosphatase: 78 U/L (ref 38–126)
Anion gap: 11 (ref 5–15)
BUN: 21 mg/dL (ref 8–23)
CO2: 21 mmol/L — ABNORMAL LOW (ref 22–32)
Calcium: 8.6 mg/dL — ABNORMAL LOW (ref 8.9–10.3)
Chloride: 109 mmol/L (ref 98–111)
Creatinine, Ser: 1.31 mg/dL — ABNORMAL HIGH (ref 0.61–1.24)
GFR calc Af Amer: >60 mL/min (ref >60)
GFR calc non Af Amer: 58 mL/min — ABNORMAL LOW (ref >60)
Glucose, Bld: 122 mg/dL — ABNORMAL HIGH (ref 70–99)
Potassium: 3.9 mmol/L (ref 3.5–5.1)
Sodium: 141 mmol/L (ref 135–145)
Total Bilirubin: 0.8 mg/dL (ref 0.3–1.2)
Total Protein: 6.6 g/dL (ref 6.5–8.1)

## 2020-07-15 LAB — GLUCOSE, CAPILLARY
Glucose-Capillary: 123 mg/dL — ABNORMAL HIGH (ref 70–99)
Glucose-Capillary: 94 mg/dL (ref 70–99)

## 2020-07-15 LAB — HEMOGLOBIN A1C
Hgb A1c MFr Bld: 6.9 % — ABNORMAL HIGH (ref 4.8–5.6)
Mean Plasma Glucose: 151.33 mg/dL

## 2020-07-15 LAB — SARS CORONAVIRUS 2 BY RT PCR (HOSPITAL ORDER, PERFORMED IN ~~LOC~~ HOSPITAL LAB): SARS Coronavirus 2: NEGATIVE

## 2020-07-15 LAB — LIPASE, BLOOD: Lipase: 24 U/L (ref 11–51)

## 2020-07-15 LAB — LACTIC ACID, PLASMA
Lactic Acid, Venous: 1.3 mmol/L (ref 0.5–1.9)
Lactic Acid, Venous: 1.7 mmol/L (ref 0.5–1.9)

## 2020-07-15 MED ORDER — ENOXAPARIN SODIUM 80 MG/0.8ML ~~LOC~~ SOLN
0.5000 mg/kg | SUBCUTANEOUS | Status: DC
  Administered 2020-07-15 – 2020-07-20 (×6): 75 mg via SUBCUTANEOUS
  Filled 2020-07-15 (×6): qty 0.75

## 2020-07-15 MED ORDER — BISACODYL 5 MG PO TBEC
5.0000 mg | DELAYED_RELEASE_TABLET | Freq: Every day | ORAL | Status: DC | PRN
  Administered 2020-07-20: 5 mg via ORAL
  Filled 2020-07-15 (×2): qty 1

## 2020-07-15 MED ORDER — POLYETHYLENE GLYCOL 3350 17 G PO PACK: 17.0000 g | PACK | Freq: Every day | ORAL | Status: DC | PRN

## 2020-07-15 MED ORDER — OXYCODONE HCL 5 MG PO TABS: 5.0000 mg | ORAL_TABLET | Freq: Four times a day (QID) | ORAL | Status: DC | PRN

## 2020-07-15 MED ORDER — HYDROMORPHONE HCL 1 MG/ML IJ SOLN
0.5000 mg | Freq: Three times a day (TID) | INTRAMUSCULAR | Status: DC | PRN
  Administered 2020-07-16: 0.5 mg via INTRAVENOUS
  Filled 2020-07-15 (×2): qty 1

## 2020-07-15 MED ORDER — MORPHINE SULFATE (PF) 4 MG/ML IV SOLN
4.0000 mg | Freq: Once | INTRAVENOUS | Status: AC
  Administered 2020-07-15: 4 mg via INTRAMUSCULAR
  Filled 2020-07-15: qty 1

## 2020-07-15 MED ORDER — VANCOMYCIN HCL 1250 MG/250ML IV SOLN
1250.0000 mg | Freq: Two times a day (BID) | INTRAVENOUS | Status: DC
  Administered 2020-07-15 – 2020-07-18 (×7): 1250 mg via INTRAVENOUS
  Filled 2020-07-15 (×7): qty 250

## 2020-07-15 MED ORDER — FENTANYL CITRATE (PF) 100 MCG/2ML IJ SOLN: 100.0000 ug | Freq: Once | INTRAMUSCULAR | Status: DC

## 2020-07-15 MED ORDER — MORPHINE SULFATE 15 MG PO TABS: 60.0000 mg | ORAL_TABLET | Freq: Three times a day (TID) | ORAL | Status: DC

## 2020-07-15 MED ORDER — IOHEXOL 300 MG/ML  SOLN
100.0000 mL | Freq: Once | INTRAMUSCULAR | Status: AC | PRN
  Administered 2020-07-15: 100 mL via INTRAVENOUS

## 2020-07-15 MED ORDER — INSULIN GLARGINE 100 UNIT/ML ~~LOC~~ SOLN
7.0000 [IU] | Freq: Every day | SUBCUTANEOUS | Status: DC
  Administered 2020-07-15 – 2020-07-16 (×2): 7 [IU] via SUBCUTANEOUS
  Filled 2020-07-15 (×3): qty 0.07

## 2020-07-15 MED ORDER — OXYCODONE HCL 5 MG PO TABS
5.0000 mg | ORAL_TABLET | Freq: Four times a day (QID) | ORAL | Status: DC
  Administered 2020-07-15 – 2020-07-17 (×8): 5 mg via ORAL
  Filled 2020-07-15 (×8): qty 1

## 2020-07-15 MED ORDER — SODIUM CHLORIDE 0.9 % IV BOLUS
1000.0000 mL | Freq: Once | INTRAVENOUS | Status: AC
  Administered 2020-07-15: 1000 mL via INTRAVENOUS

## 2020-07-15 MED ORDER — INSULIN ASPART 100 UNIT/ML ~~LOC~~ SOLN
0.0000 [IU] | Freq: Three times a day (TID) | SUBCUTANEOUS | Status: DC
  Administered 2020-07-15 – 2020-07-16 (×2): 2 [IU] via SUBCUTANEOUS
  Administered 2020-07-17 (×2): 3 [IU] via SUBCUTANEOUS
  Administered 2020-07-18: 2 [IU] via SUBCUTANEOUS
  Administered 2020-07-19: 3 [IU] via SUBCUTANEOUS
  Administered 2020-07-20: 2 [IU] via SUBCUTANEOUS

## 2020-07-15 NOTE — ED Notes (Signed)
Carb modified lunch tray ordered 

## 2020-07-15 NOTE — ED Notes (Signed)
The pt reports that he fell earlier tonight onto the lt hip  Many falls this past 3 months hx chronic back pain  Is seen at  The pain clinic somewhere in town

## 2020-07-15 NOTE — ED Provider Notes (Signed)
Care transferred to me.  Patient's WBC is 14 and the rest of his blood work is benign besides some minimal bump in creatinine.  He was given IV fluids.  Given the persistent pain and possible infection near his left hip, CT with contrast obtained.  Has been personally reviewed.  Shows cellulitis but no obvious drainable abscess.  He does meet SIRS criteria given the elevated white blood cell count and pulse rate and I think it is reasonable given how weak he is to get him admitted to the hospital for IV antibiotics and supportive care.  We will also need social work as he probably will not be able to go home by himself. Internal medicine to admit.   Pricilla Loveless, MD 07/15/20 [phone]

## 2020-07-15 NOTE — H&P (Signed)
Date: 07/15/2020               Patient Name:  [name] MRN: [id number]  DOB: [birth year] Age / Sex: 65 y.o., male   PCP: Patient, No Pcp Per         Medical Service: Internal Medicine Teaching Service         Attending Physician: Dr. Sid Falcon, MD    First Contact: Dr. Alfonse Spruce Pager: [phone]  Second Contact: Dr. Eileen Stanford Pager: [phone]       After Hours (After 5p/  First Contact Pager: [phone]  weekends / holidays): Second Contact Pager: [phone]   Chief Complaint: Hip pain  History of Present Illness:  [name] is a 64 yo male with PMH of DM, HTN, HLD, and chronic pain syndrome who present to the hospital for chief complain of hip pain after a fall.   He states that he has been falling quite frequently due to lower back pain and balance issues. He fell yesterday after losing his balance and subsequently called his brother who advised to call EMS. He denies head injury, LOC, neck pain or upper back pain. He did complain pf lower back pain after the fall. Patient states that he has had surgery of left hip. He does have a Left hip wound which he states occurred after his first 2 falls which occurred 2-3 months ago. Patient states that he went an Urgent care and they recommended hospital visit, which he did not go. He states that his wound was bleeding and draining pus at that time. He denies fevers, chills, chest pain. He endorses chronic SOB for 3 months. He states that he has been eating well and drinking plenty of fluid.  Patient currently live alone at home. He has a brother who lives nearby. Patient has not seen a PCP and is not taking any medications except for Oxycodone 10 mg Q6h.  ED course: Patient appears in no acute distress during examination. He complains of pain at left groin. He has a leukocytosis of 12.1 and creatine of 1.3 (unkown baseline, was 1.09 in 2013). Patient met SIRS criteria 2/4. He received 1L bolus of NS and started on Vancomycin. He is admitted  for treatment of purulent cellulitis and TOC arrangement.   Meds:  No outpatient medications have been marked as taking for the 07/15/20 encounter [facility] Encounter).     Allergies: Allergies as of 07/15/2020 - Review Complete 07/15/2020  Allergen Reaction Noted  . Lipitor [atorvastatin calcium]  01/28/2012   Past Medical History:  Diagnosis Date  . Diabetes mellitus   . Hyperlipidemia   . Hypertension     Family History:  Mom: stroke   Social History:  Smoking: 1/2 pack for 2 months Alcohol: last drink 11 years ago No illicit drug used  Review of Systems: A complete ROS was negative except as per HPI.   Physical Exam: Blood pressure (!) 146/88, pulse 103, temperature 99 F (37.2 C), temperature source Rectal, resp. rate (!) 29, height _0  (1.803 m), weight (!) 150.6 kg, SpO2 100 %.   Physical Exam Constitutional:      General: He is not in acute distress.    Comments: Poor historian  HENT:     Head: Normocephalic.  Eyes:     General: No scleral icterus.    Conjunctiva/sclera: Conjunctivae normal.  Cardiovascular:     Rate and Rhythm: Normal rate and regular rhythm.  Pulmonary:     Effort: Pulmonary  effort is normal. No respiratory distress.  Abdominal:     General: Bowel sounds are normal. There is no distension.     Palpations: Abdomen is soft.     Tenderness: There is no abdominal tenderness.  Musculoskeletal:     Cervical back: Normal range of motion.     Right lower leg: No edema.     Left lower leg: No edema.     Comments: Tender to palpation of lower and upper left leg, especially the left groin area. Pedis pulse palpated.   Skin:    General: Skin is warm.     Coloration: Skin is not jaundiced.     Comments: An 5x6cm wound noted at left hip area. Wound is not currently bleeding or draining pus.   Neurological:     Mental Status: He is alert.  Psychiatric:        Mood and Affect: Mood normal.        Behavior: Behavior normal.    X-ray knee  and ankle: negative for fracture  CT left hip:  Large area of soft tissue ulceration lateral to the left greater trochanter with air extending approximately 7 cm deep to the skin surface, consistent with cellulitis. Small ill-defined soft tissue fluid collection inferior to the ulcer, but no drainable collection identified. 2. No evidence of osteomyelitis or septic joint. 3. 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 Small left hip joint effusion with probable intra-articular loose bodies. 4. No evidence of acute fracture. 5. Marked chronic muscular atrophy as described.  Lab  CBC Latest Ref Rng & Units 07/15/2020 01/28/2012 01/19/2009  WBC 4.0 - 10.5 K/uL 14.1(H) 8.5 -  Hemoglobin 13.0 - 17.0 g/dL 12.1(L) 13.3(A) 13.5  Hematocrit 39 - 52 % 37.5(L) 41.6(A) -  Platelets 150 - 400 K/uL 246 - -   BMP Latest Ref Rng & Units 07/15/2020 01/28/2012 01/18/2009  Glucose 70 - 99 mg/dL 122(H) 115(H) 133(H)  BUN 8 - 23 mg/dL _0 Creatinine 0.61 - 1.24 mg/dL 1.31(H) 1.09 1.00  Sodium 135 - 145 mmol/L 141 136 139  Potassium 3.5 - 5.1 mmol/L 3.9 4.8 3.8  Chloride 98 - 111 mmol/L 109 102 107  CO2 22 - 32 mmol/L 21(L) 24 25  Calcium 8.9 - 10.3 mg/dL 8.6(L) 9.6 8.9     Assessment & Plan by Problem: Principal Problem:   Purulent Cellulitis of left hip Active Problems:   Diabetes mellitus (HCC)   Recurrent falls   Chronic pain   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 Gergen is a 64 yo male with PMH of DM, HTN, HLD, and chronic pain syndrome who present to the hospital for chief complain of hip pain after a fall. Currently treating for Cellulitis of Left hip  Purulent cellulitis of left hip.  CT left hip shows large area of soft tissue ulceration lateral to the left greater trochanter with air extending approximately 7 cm deep to the skin surface, consistent with cellulitis. No osteomyelitis  noted. Patient meets SIRS criteria (2/4 with leukocytosis and tachypnea). Wound appear non bleeding or draining pus. However patient reports pus drainage in the past. Therefore patient is currently being treated for purulent cellulitis. Patient is currently doing well, and not febrile. No sign of systemic infection.  - Continue Vancomycin. Antibiotics duration 7-14 days.  - Pending wound culture - Pending blood culture - CBC daily  - Lumbar X-ray to rule out  compression fracture   AKI Baseline creatine 1.09 in 2013 and 1.00 in 2010. Current Cr 1.31. 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 Patient lives alone at home. His only family member that lives nearby is his brother. Patient has not seen a PCP for a long time and not taking any medications for his chronic medical conditions. Patient will need TOC and home living situation arranged for his safety.  - PT/OT eval - Consult TOC for PCP and home health   Chronic pain syndrome Patient reports taking Oxycodone 10 mg Q6h but has not had his medications for a while.  - Start Oxycodone 5 mg Q6h  - Dilaudid 0.5 mg Q8H for breakthrough pain  - Bowel regimen with Dulcolax and Miralax.   DM Patient is not taking Insulin at home for a while.  - Pending A1C - Start Lantus 7 units daily - SSI  - CBG monitor.      Dispo: Admit patient to Inpatient with expected length of stay greater than 2 midnights.  Signed: Gaylan Gerold, DO 07/15/2020, 1:23 PM  Pager: [phone] After 5pm on weekdays and 1pm on weekends: On Call pager: [phone]

## 2020-07-15 NOTE — Plan of Care (Signed)
  Problem: Health Behavior/Discharge Planning: Goal: Ability to manage health-related needs will improve Outcome: Progressing   Problem: Clinical Measurements: Goal: Will remain free from infection Outcome: Progressing Goal: Diagnostic test results will improve Outcome: Progressing   Problem: Clinical Measurements: Goal: Diagnostic test results will improve Outcome: Progressing   Problem: Activity: Goal: Risk for activity intolerance will decrease Outcome: Progressing   Problem: Health Behavior/Discharge Planning: Goal: Ability to manage health-related needs will improve Outcome: Progressing

## 2020-07-15 NOTE — Progress Notes (Signed)
Pharmacy Antibiotic Note  UTHMAN MROCZKOWSKI is a 64 y.o. male admitted on 07/15/2020 with cellulitis of the left hip. Pharmacy has been consulted for vancomycin dosing.  Height: 5\' 11"  (180.3 cm) Weight: (!) 150.6 kg (332 lb 0.2 oz) IBW/kg (Calculated) : 75.3  Temp (24hrs), Avg:99 F (37.2 C), Min:99 F (37.2 C), Max:99 F (37.2 C)  Recent Labs  Lab 07/15/20 0735  WBC 14.1*  CREATININE 1.31*    Estimated Creatinine Clearance: 87.2 mL/min (A) (by C-G formula based on SCr of 1.31 mg/dL (H)).    Allergies  Allergen Reactions  . Lipitor [Atorvastatin Calcium]     Muscle pain    Assessment: Patient has large left hip wound. Patient reports he recently had a tube in left hip. Afebrile, WBC elevated.   Plan: Vancomycin IV 1250mg  q12hr  Nomogram dosing. Goal trough 10-15 No bolus due to indication.   Monitor renal function daily. Follow up with cultures.  Thank you for allowing pharmacy to be a part of this patient's care.  07/17/20, PharmD PGY1 Pharmacy Resident 07/15/2020 11:39 AM

## 2020-07-15 NOTE — ED Triage Notes (Signed)
The pt arrived by [facility] from home where he lives alone  He reports that for 3 months he has been unable to get out of the house he walks with a  Dan Humphreys  His brother brings him food.  He is absolutely nasty  His shirt and robes  Are  Nasty  He smells terrible.  He reports that there was a drain left in hid lt hip and it  FELL OUT IN THE LAST FEW DAYS ALERT.  HE REPORTS HAVING BED BUGS FOR FOUR MONTHS  None visible on arrival. All clothes and bed sheet bagged up in a red bag  With the top closed off

## 2020-07-15 NOTE — ED Provider Notes (Signed)
[facility] EMERGENCY DEPARTMENT Provider Note   CSN: [id number] Arrival date & time: 07/15/20  0428     History Chief Complaint  Patient presents with  . Hip Pain    [name] is a 64 y.o. male.  The history is provided by the patient.  Fall This is a recurrent problem. The problem has been gradually worsening. Associated symptoms include abdominal pain. Pertinent negatives include no chest pain and no headaches. The symptoms are aggravated by walking. Nothing relieves the symptoms.   Patient with history of diabetes, hypertension, hyperlipidemia, obesity presents after fall.  Patient reports increasing falls over the past several months.  He reports tonight that he fell in his restroom.  He had to call for help because he could not get up by himself.  He denies any head injury or LOC.  No new neck pain.  He does report increased pain in his left hip and left leg.  He reports mild abdominal pain.  He reports he "had a tube "in his left hip since 1988.  He reports it recently fell out.  He reports the only doctors that he sees recently his pain management    Past Medical History:  Diagnosis Date  . Diabetes mellitus   . Hyperlipidemia   . Hypertension     Patient Active Problem List   Diagnosis Date Noted  . Diabetes mellitus 01/28/2012    Past Surgical History:  Procedure Laterality Date  . SPINE SURGERY         No family history on file.  Social History   Tobacco Use  . Smoking status: Current Every Day Smoker    Packs/day: 0.50    Years: 40.00    Pack years: 20.00    Types: Cigarettes  . Smokeless tobacco: Never Used  Substance Use Topics  . Alcohol use: No  . Drug use: No    Home Medications Prior to Admission medications   Medication Sig Start Date End Date Taking? Authorizing Provider  amitriptyline (ELAVIL) 10 MG tablet Take 10 mg by mouth at bedtime.    [provider]  HUMULIN N 100 UNIT/ML injection INJECT 15  UNITS INTO THE SKIN DAILY. MUST HAVE OFFICE VISIT FOR FURTHER REFILLS 04/09/13   Nelva Nay, PA-C  losartan (COZAAR) 50 MG tablet TAKE 1 TABLET EVERY DAY 06/16/12   Sondra Barges, PA-C  morphine (MS CONTIN) 200 MG 12 hr tablet Take 100 mg by mouth 2 (two) times daily.    [provider]  Vitamin D, Ergocalciferol, (DRISDOL) 50000 UNITS CAPS Take 2,000 Units by mouth 1 day or 1 dose.    [provider]    Allergies    Lipitor [atorvastatin calcium]  Review of Systems   Review of Systems  Constitutional: Negative for fever.  Cardiovascular: Negative for chest pain.  Gastrointestinal: Positive for abdominal pain.  Musculoskeletal: Positive for arthralgias.  Skin: Positive for wound.  Neurological: Negative for headaches.  All other systems reviewed and are negative.   Physical Exam Updated Vital Signs BP (!) 146/88   Pulse 103   Resp (!) 29   Ht 1.803 m (5\' 11" )   Wt (!) 150.6 kg   SpO2 100%   BMI 46.31 kg/m   Physical Exam CONSTITUTIONAL: Chronically ill-appearing, disheveled HEAD: Normocephalic/atraumatic EYES: EOMI/PERRL ENMT: Mucous membranes moist NECK: supple no meningeal signs SPINE/BACK:entire spine nontender, no bruising/crepitance/stepoffs noted to spine CV: S1/S2 noted, no murmurs/rubs/gallops noted LUNGS: Lungs are clear to auscultation bilaterally,  no apparent distress ABDOMEN: soft, nontender, no rebound or guarding, bowel sounds noted throughout abdomen, obese GU:no cva tenderness NEURO: Pt is awake/alert/appropriate, moves all extremitiesx4.  No facial droop.   EXTREMITIES: pulses normal/equal, distal pulses intact.  Diffuse tenderness to left knee and left ankle.  Tenderness with range of motion of left hip.  Large wound noted on left hip.  See photo below SKIN: warm, color normal PSYCH: Unable to assess     ED Results / Procedures / Treatments   Labs (all labs ordered are listed, but only abnormal results are displayed) Labs  Reviewed  CBG MONITORING, ED - Abnormal; Notable for the following components:      Result Value   Glucose-Capillary 164 (*)    All other components within normal limits  CBC WITH DIFFERENTIAL/PLATELET  COMPREHENSIVE METABOLIC PANEL  LIPASE, BLOOD  URINALYSIS, ROUTINE W REFLEX MICROSCOPIC    EKG EKG Interpretation  Date/Time:  Sunday July 15 2020 04:31:37 EDT Ventricular Rate:  107 PR Interval:    QRS Duration: 90 QT Interval:  320 QTC Calculation: 427 R Axis:   92 Text Interpretation: Sinus tachycardia Anterior infarct, old No previous ECGs available Abnormal ekg Interpretation limited secondary to artifact Confirmed by Zadie Rhine ([id number]) on 07/15/2020 4:39:42 AM   Radiology DG Knee Left Port  Result Date: 07/15/2020 CLINICAL DATA:  Left lower extremity pain EXAM: PORTABLE LEFT KNEE - 1-2 VIEW COMPARISON:  None. FINDINGS: No evidence of fracture, dislocation, or joint effusion. No evidence of arthropathy or other focal bone abnormality. Soft tissues are unremarkable. IMPRESSION: Negative. Electronically Signed   By: Deatra Robinson M.D.   On: 07/15/2020 06:35   DG Ankle Left Port  Result Date: 07/15/2020 CLINICAL DATA:  Left lower extremity pain EXAM: PORTABLE LEFT ANKLE - 2 VIEW COMPARISON:  None. FINDINGS: There is no evidence of fracture, dislocation, or joint effusion. There is no evidence of arthropathy or other focal bone abnormality. Soft tissues are unremarkable. IMPRESSION: Negative. Electronically Signed   By: Deatra Robinson M.D.   On: 07/15/2020 06:35   DG Hip Port Unilat W or Wo Pelvis 1 View Left  Result Date: 07/15/2020 CLINICAL DATA:  Left hip wound EXAM: DG HIP (WITH OR WITHOUT PELVIS) 1V PORT LEFT COMPARISON:  None. FINDINGS: There is a drainage tract at the lateral soft tissues of the left hip. No retained foreign body. There is screw and plate fixation hardware along the left iliac bone. Advanced left hip degenerative change. No evidence of active infectious  osteitis. IMPRESSION: Drainage tract at the lateral soft tissues of the left hip. No retained foreign body. Electronically Signed   By: Deatra Robinson M.D.   On: 07/15/2020 06:31    Procedures Procedures  Medications Ordered in ED Medications  fentaNYL (SUBLIMAZE) injection 100 mcg (has no administration in time range)  morphine 4 MG/ML injection 4 mg (has no administration in time range)    ED Course  I have reviewed the triage vital signs and the nursing notes.  Pertinent labs & imaging results that were available during my care of the patient were reviewed by me and considered in my medical decision making (see chart for details).    MDM Rules/Calculators/A&P                          5:12 AM Patient presents for recurrent fall reports he was unable to get up tonight.  Patient is a very poor historian, as he reports he  had a tube in his left hip.  It is not clear from the history or records what he is referring to.  He has a large wound noted on the left hip.  At this point we will start with labs and plain x-rays of the hip and leg.  He may need CT imaging. 7:09 AM No acute fx on xray imaging but does have tract from wound Labs are delayed He may need CT imaging of hip to eval for occult fx At signout to Dr. Criss Alvine f/u on labs and likely admit as patient is unable to care for himself at home  Final Clinical Impression(s) / ED Diagnoses Final diagnoses:  Pain    Rx / DC Orders ED Discharge Orders    None       Zadie Rhine, MD 07/15/20 0710

## 2020-07-15 NOTE — ED Notes (Signed)
Report attempted, RN to call back. 

## 2020-07-16 ENCOUNTER — Inpatient Hospital Stay (HOSPITAL_COMMUNITY): Payer: Medicare Other

## 2020-07-16 ENCOUNTER — Encounter (HOSPITAL_COMMUNITY): Payer: Self-pay | Admitting: Internal Medicine

## 2020-07-16 DIAGNOSIS — M79609 Pain in unspecified limb: Secondary | ICD-10-CM

## 2020-07-16 DIAGNOSIS — M7989 Other specified soft tissue disorders: Secondary | ICD-10-CM

## 2020-07-16 LAB — CBC
HCT: 32.7 % — ABNORMAL LOW (ref 39.0–52.0)
Hemoglobin: 10.8 g/dL — ABNORMAL LOW (ref 13.0–17.0)
MCH: 30.4 pg (ref 26.0–34.0)
MCHC: 33 g/dL (ref 30.0–36.0)
MCV: 92.1 fL (ref 80.0–100.0)
Platelets: 222 10*3/uL (ref 150–400)
RBC: 3.55 MIL/uL — ABNORMAL LOW (ref 4.22–5.81)
RDW: 13.8 % (ref 11.5–15.5)
WBC: 12.6 10*3/uL — ABNORMAL HIGH (ref 4.0–10.5)
nRBC: 0 % (ref 0.0–0.2)

## 2020-07-16 LAB — BASIC METABOLIC PANEL
Anion gap: 8 (ref 5–15)
BUN: 17 mg/dL (ref 8–23)
CO2: 19 mmol/L — ABNORMAL LOW (ref 22–32)
Calcium: 8 mg/dL — ABNORMAL LOW (ref 8.9–10.3)
Chloride: 108 mmol/L (ref 98–111)
Creatinine, Ser: 1.05 mg/dL (ref 0.61–1.24)
GFR calc Af Amer: >60 mL/min (ref >60)
GFR calc non Af Amer: >60 mL/min (ref >60)
Glucose, Bld: 92 mg/dL (ref 70–99)
Potassium: 4 mmol/L (ref 3.5–5.1)
Sodium: 135 mmol/L (ref 135–145)

## 2020-07-16 LAB — IRON AND TIBC
Iron: 37 ug/dL — ABNORMAL LOW (ref 45–182)
Saturation Ratios: 19 % (ref 17.9–39.5)
TIBC: 199 ug/dL — ABNORMAL LOW (ref 250–450)
UIBC: 162 ug/dL

## 2020-07-16 LAB — GLUCOSE, CAPILLARY
Glucose-Capillary: 85 mg/dL (ref 70–99)
Glucose-Capillary: 111 mg/dL — ABNORMAL HIGH (ref 70–99)
Glucose-Capillary: 129 mg/dL — ABNORMAL HIGH (ref 70–99)
Glucose-Capillary: 123 mg/dL — ABNORMAL HIGH (ref 70–99)

## 2020-07-16 LAB — PSA: Prostatic Specific Antigen: 1.06 ng/mL (ref 0.00–4.00)

## 2020-07-16 LAB — FERRITIN: Ferritin: 343 ng/mL — ABNORMAL HIGH (ref 24–336)

## 2020-07-16 NOTE — Evaluation (Signed)
Physical Therapy Evaluation Patient Details Name: [name] MRN: [id number] DOB: 07-17-1956 Today's Date: 07/16/2020   History of Present Illness  [name] is a 64 yo male with PMH of DM, HTN, HLD, and chronic pain syndrome who present to the hospital for chief complaint of L hip pain after a fall. CT revealed no acute fx of L hip but was consistent with cellulitis, joing effusion, and degenerative changes.  Clinical Impression  Pt admitted with above diagnosis. Pt presents with profound weakness LLE as well as generalized weakness full body. Pt c/o LBP and L hip pain. Max A needed to achieve sitting EOB. Pt unable to take wt through LLE or use it to scoot along EOB. Total A for pt to return to supine. Pt reports that he has been unable to go out of his mobile home past month because of inability to navigate 3 stairs. He has a brother that can come stay with him but he needs to be able to transfer. At current functional level feel that pt will need post acute rehab before being able to return home, even if his brother can in fact come stay with him.  Pt currently with functional limitations due to the deficits listed below (see PT Problem List). Pt will benefit from skilled PT to increase their independence and safety with mobility to allow discharge to the venue listed below.       Follow Up Recommendations SNF;Supervision/Assistance - 24 hour    Equipment Recommendations  Other (comment) (TBD)    Recommendations for Other Services OT consult     Precautions / Restrictions Precautions Precautions: Fall Precaution Comments: pt has fallen at home several times recently Restrictions Weight Bearing Restrictions: No      Mobility  Bed Mobility Overal bed mobility: Needs Assistance Bed Mobility: Rolling;Sidelying to Sit;Sit to Supine Rolling: Max assist Sidelying to sit: Max assist   Sit to supine: Max assist   General bed mobility comments: max A to roll to R and bring LE's  off front of bed. Pt unable to push self up into sitting, max A needed to R shoulder. Pt had difficulty gaining balance EOB with initial sitting. Pt dependent for return to supine.   Transfers Overall transfer level: Needs assistance Equipment used: None Transfers: Lateral/Scoot Transfers          Lateral/Scoot Transfers: Total assist General transfer comment: pt attempted to scoot to [facility] to R but was unable to scoot more than 3 in. Max A to continue with scooting  Ambulation/Gait             General Gait Details: unable  Stairs            Wheelchair Mobility    Modified Rankin (Stroke Patients Only)       Balance Overall balance assessment: Needs assistance Sitting-balance support: Bilateral upper extremity supported;Feet supported Sitting balance-Leahy Scale: Poor Sitting balance - Comments: posterior LOB when positioning hips square at EOB. Progressed to fair as he sat up longer.  Postural control: Posterior lean                                   Pertinent Vitals/Pain Pain Assessment: Faces Faces Pain Scale: Hurts whole lot Pain Location: low back and L hip Pain Descriptors / Indicators: Aching;Constant;Discomfort;Grimacing Pain Intervention(s): Limited activity within patient's tolerance;Monitored during session;Repositioned    Home Living Family/patient expects to be discharged to:: Private  residence Living Arrangements: Alone Available Help at Discharge: Family;Available PRN/intermittently Type of Home: Mobile home Home Access: Stairs to enter Entrance Stairs-Rails: Right Entrance Stairs-Number of Steps: 3 Home Layout: One level Home Equipment: Walker - 2 wheels;Shower seat;Wheelchair - manual Additional Comments: pt's brother has been bringing him food and he has been sitting in his w/c to prepare it. He reports that he has another brother that can come live with him short term    Prior Function Level of Independence: Needs  assistance   Gait / Transfers Assistance Needed: has been spending most of his time in w/c and has been falling when getting up. Has not gone out of house in at least a month  ADL's / Homemaking Assistance Needed: poor hygeine noted. Pt did not have help but seems that he needed it        Hand Dominance        Extremity/Trunk Assessment   Upper Extremity Assessment Upper Extremity Assessment: Defer to OT evaluation;Generalized weakness    Lower Extremity Assessment Lower Extremity Assessment: RLE deficits/detail;LLE deficits/detail RLE Deficits / Details: hip flex 3-/5, knee ext 3/5, ankle >3/5, pt reports he has a h/o injury to RLE after a surgery RLE Sensation: decreased proprioception;decreased light touch;history of peripheral neuropathy RLE Coordination: decreased fine motor;decreased gross motor LLE Deficits / Details: hip flex 1/5, knee ext 1/5, ankle df 2/5 LLE: Unable to fully assess due to pain LLE Sensation: decreased proprioception;history of peripheral neuropathy LLE Coordination: decreased fine motor;decreased gross motor    Cervical / Trunk Assessment Cervical / Trunk Assessment: Kyphotic  Communication   Communication: No difficulties  Cognition Arousal/Alertness: Awake/alert Behavior During Therapy: WFL for tasks assessed/performed Overall Cognitive Status: Within Functional Limits for tasks assessed                                        General Comments General comments (skin integrity, edema, etc.): Noted small wound lateral L ankle, notified RN. Pt requested to be positioned in R SL in bed after session, helped him do this. Pt also requested all 4 rails up for safety, did this and notified RN    Exercises     Assessment/Plan    PT Assessment Patient needs continued PT services  PT Problem List Decreased strength;Decreased range of motion;Decreased activity tolerance;Decreased balance;Decreased mobility;Pain;Impaired  sensation;Decreased knowledge of precautions;Decreased safety awareness;Decreased knowledge of use of DME;Decreased coordination;Decreased skin integrity       PT Treatment Interventions DME instruction;Gait training;Functional mobility training;Therapeutic activities;Therapeutic exercise;Balance training;Neuromuscular re-education;Cognitive remediation;Patient/family education    PT Goals (Current goals can be found in the Care Plan section)  Acute Rehab PT Goals Patient Stated Goal: pt would rather go home than rehab but agrees that he needs to be able to transfer for brother to care for him PT Goal Formulation: With patient Time For Goal Achievement: 07/30/20 Potential to Achieve Goals: Fair    Frequency Min 2X/week   Barriers to discharge Decreased caregiver support;Inaccessible home environment lives alone, 3 STE home    Co-evaluation               AM-PAC PT "6 Clicks" Mobility  Outcome Measure Help needed turning from your back to your side while in a flat bed without using bedrails?: A Lot Help needed moving from lying on your back to sitting on the side of a flat bed without using bedrails?: A Lot Help  needed moving to and from a bed to a chair (including a wheelchair)?: Total Help needed standing up from a chair using your arms (e.g., wheelchair or bedside chair)?: Total Help needed to walk in hospital room?: Total Help needed climbing 3-5 steps with a railing? : Total 6 Click Score: 8    End of Session   Activity Tolerance: Patient limited by pain Patient left: in bed;with call bell/phone within reach;with bed alarm set Nurse Communication: Mobility status PT Visit Diagnosis: Muscle weakness (generalized) (M62.81);Unsteadiness on feet (R26.81);Pain;Difficulty in walking, not elsewhere classified (R26.2) Pain - Right/Left: Left Pain - part of body: Hip    Time: 1062-6948 PT Time Calculation (min) (ACUTE ONLY): 28 min   Charges:   PT Evaluation $PT Eval  Moderate Complexity: 1 Mod PT Treatments $Therapeutic Activity: 8-22 mins        Lyanne Co, PT  Acute Rehab Services  Pager [phone] Office [phone]   Lawana Chambers Amaani Guilbault 07/16/2020, 3:29 PM

## 2020-07-16 NOTE — Progress Notes (Signed)
Radiology called in regards to pt having x-ray post decontamination of bed bugs currently no orders- for treatment of bed bugs

## 2020-07-16 NOTE — Progress Notes (Signed)
  Date: 07/16/2020  Patient name: [name]  Medical record number: [id number]  Date of birth: October 26, 1956   I have seen and evaluated [name] and discussed their care with the Residency Team. Briefly, [name] is a 64 year old man with PMH of DM, HTN, HLD and chronic pain who has not come to see a physician for a few years.  He has had frequent falls and presented with a large wound and cellulitis with pus of the left lateral/posterior hip.  See previous pictures.  In the ED he had a WBC of 12, Cr of 1.3.  He was started on vancomycin.     Vitals:   07/16/20 1300 07/16/20 1422  BP: 123/66 (!) 148/79  Pulse: 77 76  Resp: 17 17  Temp: 99.9 F (37.7 C) 98.9 F (37.2 C)  SpO2: 95% 95%   General: alert, appears to be in pain Eyes: anicteric sclerae CV: RR, NR, no murmur.  He has left sided edema to the leg and pain with palpatoin Pulm: Breathing comfortably, no wheezing Abd: Soft, +BS Skin: He has a bandage over wound at this time, no apparent drainage.  He is TTP over his entire left leg and has some erythematous areas on the lower knee upper lower leg.    Assessment and Plan: I have seen and evaluated the patient as outlined above. I agree with the formulated Assessment and Plan as detailed in the residents' note, with the following changes:   1. Purulent cellulitis left hip - Vancomycin - Follow up wound and blood cultures - Trend CBC - US of the left leg given pain and falls - No evidence of OM on CT of the hip  2. AKI, presumed - hematuria - Check PSA - Repeat in 1-2 weeks - Follow up outpatient - Check BMP daily  3. Frailty, changes to left hip - Consider outpatient orthopedics consult - PT/OT  Other issues per Dr. Weyman Rodney note.   Inez Catalina, MD 7/26/20216:03 PM

## 2020-07-16 NOTE — Progress Notes (Signed)
Per IMTS- on call pt clear to to go to x-ray no bed bugs identified- enteric precautions not necessary at this time

## 2020-07-16 NOTE — Progress Notes (Signed)
Lower extremity venous has been completed.   Preliminary results in CV Proc.   Blanch Media 07/16/2020 2:52 PM

## 2020-07-16 NOTE — Consult Note (Signed)
WOC Nurse Consult Note: Patient receiving care in [facility] 5N14.  Patient able to turn self slightly to the right, but with great difficulty and pain in the left hip and back. Reason for Consult: L hip wound Wound type: chronic, non-healing left trochanter surgical site Pressure Injury POA: Yes/No/NA Measurement: 1.3 cm x 2.5 cm x 1.5 cm with 2.1 cm undermining at 11 o'clock Wound bed: pink Drainage (amount, consistency, odor) heavy thin, brown on existing foam dressing and sheets Periwound: erythematous, fungal rash involvement, some induration Dressing procedure/placement/frequency: Wash left hip wound with soap and water. Pat dry. Cut a narrow strip of Aquacel dressing [facility] [place] [phone]) and use a cotton tipped applicator to insert the strip into the wound.  Leave a tail hanging out to ease removal.  Sprinkle antifungal powder (green and white bottle in clean utility) on the skin around the opening. Cover with a square foam dressing. Perform twice daily. Monitor the wound area(s) for worsening of condition such as: Signs/symptoms of infection,  Increase in size,  Development of or worsening of odor, Development of pain, or increased pain at the affected locations.  Notify the medical team if any of these develop.  Thank you for the consult.  Discussed plan of care with the patient.  WOC nurse will not follow at this time.  Please re-consult the WOC team if needed.  Helmut Muster, RN, MSN, CWOCN, CNS-BC, pager [phone]

## 2020-07-16 NOTE — Progress Notes (Addendum)
Subjective:   Hospital day: 1  Overnight event: No  Patient is seen at bedside today. He is still feeling very sore in his leg although is pain has improved slightly. He denies fever, shortness of breath, chills. He has no difficulty with urinating but does have some incontinence. Condom catheter was placed. He also states that he has no difficulties with bowel movements.   Objective:  Vital signs in last 24 hours: Vitals:   07/15/20 1400 07/15/20 1438 07/15/20 2009 07/16/20 0324  BP: (!) 119/59 (!) 132/65 122/73 123/85  Pulse: 88 83 97 96  Resp: 17 16 16 16   Temp:  98.4 F (36.9 C) 99.2 F (37.3 C) 98.6 F (37 C)  TempSrc:  Oral Oral Oral  SpO2: 99% 100% 100% 100%  Weight:      Height:        Physical Exam  Physical Exam Constitutional:      General: He is not in acute distress. HENT:     Head: Normocephalic.  Eyes:     General: No scleral icterus.    Conjunctiva/sclera: Conjunctivae normal.  Cardiovascular:     Rate and Rhythm: Normal rate and regular rhythm.     Heart sounds: Normal heart sounds.  Pulmonary:     Effort: Pulmonary effort is normal. No respiratory distress.     Breath sounds: Normal breath sounds.  Abdominal:     General: Bowel sounds are normal. There is no distension.     Tenderness: There is no abdominal tenderness.  Musculoskeletal:        General: Tenderness (Tenderness to palpation of left leg) present.     Right lower leg: No edema.     Left lower leg: No edema.  Skin:    General: Skin is dry.     Coloration: Skin is not jaundiced.     Comments: No open wound on bilateral feet. New bandage of left hip wound.   Neurological:     Mental Status: He is alert.  Psychiatric:        Mood and Affect: Mood normal.     Assessment/Plan: Mr. Guevara is a 64 yo male with PMH of DM, HTN, HLD, and chronic pain syndrome who present to the hospital for chief complain of hip pain after a fall. 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  CT left hip shows large area of soft tissue ulceration lateral to the left greater trochanter with air extending approximately 7 cm deep to the skin surface, consistent with cellulitis. No osteomyelitis noted. Wound was seen by [facility] nurse. Patient is currently doing well but still complain of pain at wound site. No sign of systemic infection. WBC trending down from 14.1 to 12.6. Patient has been afebrile - Continue Vancomycin (day 2). Antibiotics duration 7-14 days. Will deescalate when wound culture comes back - Pending wound culture - Pending blood culture - CBC daily  - Pending Doppler 07-06-1998 of left LE due to pain and swelling to rule out DVT.    AKI Hematuria  Baseline creatine 1.09 in 2013 and 1.00 in 2010. Patient received 1L of NS bolus in the ER . Creatine is back to baseline at 1.05. His UA on admission shows small  Hgb and large leukocytes with rare bacteria. Patient has history of smoking so an underlying malignancy is concerned. Renal US did not show any abnormality that explains the hematuria. PSA 1.06. Will continue to monitor. Patient will need to follow up with a Urologist to further evaluate.  - BMP in AM - Encourage PO fluid intake   Normocytic anemia  Baseline Hgb around 12-13 on 2013. Current Hgb 10.8. Iron study shows Ferritin of 345, and iron sat of 19%, which consistent with anemia of chronic disease. 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 Patient lives alone at home. His only family member that lives nearby is his brother. Patient has not seen a PCP for a long time  and not taking any medications for his chronic medical conditions. Patient will need TOC and home living situation arranged for his safety.  - PT/OT eval - Consult TOC for PCP and home health   Chronic pain syndrome Patient reports taking Oxycodone 10 mg Q6h but has not had his medications for a while.  - Start Oxycodone 5 mg Q6h  - Dilaudid 0.5 mg Q8H for breakthrough pain  - Bowel regimen with Dulcolax and Miralax.   DM Patient is not taking Insulin at home for a while.  - Pending A1C - Start Lantus 7 units daily - SSI  - CBG monitor  Diet: carb modified IVF: No VTE: Lovenox CODE: Full   Prior to Admission Living Arrangement: home Anticipated Discharge Location: SNF vs home health Barriers to Discharge: TOC and wound culture Dispo: Anticipated discharge in approximately 2-3 day(s).   Doran Stabler, DO 07/16/2020, 7:25 AM Pager: [phone] After 5pm on weekdays and 1pm on weekends: On Call pager [phone]

## 2020-07-17 ENCOUNTER — Encounter (HOSPITAL_COMMUNITY): Payer: Self-pay | Admitting: Internal Medicine

## 2020-07-17 DIAGNOSIS — L0390 Cellulitis, unspecified: Secondary | ICD-10-CM

## 2020-07-17 HISTORY — DX: Cellulitis, unspecified: L03.90

## 2020-07-17 LAB — CBC
HCT: 32.9 % — ABNORMAL LOW (ref 39.0–52.0)
Hemoglobin: 10.9 g/dL — ABNORMAL LOW (ref 13.0–17.0)
MCH: 30.9 pg (ref 26.0–34.0)
MCHC: 33.1 g/dL (ref 30.0–36.0)
MCV: 93.2 fL (ref 80.0–100.0)
Platelets: 212 10*3/uL (ref 150–400)
RBC: 3.53 MIL/uL — ABNORMAL LOW (ref 4.22–5.81)
RDW: 14 % (ref 11.5–15.5)
WBC: 10.1 10*3/uL (ref 4.0–10.5)
nRBC: 0 % (ref 0.0–0.2)

## 2020-07-17 LAB — URINALYSIS, ROUTINE W REFLEX MICROSCOPIC
Bilirubin Urine: NEGATIVE
Glucose, UA: NEGATIVE mg/dL
Ketones, ur: NEGATIVE mg/dL
Leukocytes,Ua: NEGATIVE
Nitrite: NEGATIVE
Protein, ur: NEGATIVE mg/dL
Specific Gravity, Urine: 1.019 (ref 1.005–1.030)
pH: 5 (ref 5.0–8.0)

## 2020-07-17 LAB — GLUCOSE, CAPILLARY
Glucose-Capillary: 89 mg/dL (ref 70–99)
Glucose-Capillary: 152 mg/dL — ABNORMAL HIGH (ref 70–99)
Glucose-Capillary: 160 mg/dL — ABNORMAL HIGH (ref 70–99)
Glucose-Capillary: 149 mg/dL — ABNORMAL HIGH (ref 70–99)

## 2020-07-17 LAB — BLOOD GAS, ARTERIAL
Acid-base deficit: 4.8 mmol/L — ABNORMAL HIGH (ref 0.0–2.0)
Bicarbonate: 19 mmol/L — ABNORMAL LOW (ref 20.0–28.0)
Drawn by: 51702
FIO2: 21
O2 Saturation: 96.1 %
Patient temperature: 36.1
pCO2 arterial: 29.1 mmHg — ABNORMAL LOW (ref 32.0–48.0)
pH, Arterial: 7.426 (ref 7.350–7.450)
pO2, Arterial: 79 mmHg — ABNORMAL LOW (ref 83.0–108.0)

## 2020-07-17 LAB — BASIC METABOLIC PANEL
Anion gap: 9 (ref 5–15)
BUN: 17 mg/dL (ref 8–23)
CO2: 18 mmol/L — ABNORMAL LOW (ref 22–32)
Calcium: 7.9 mg/dL — ABNORMAL LOW (ref 8.9–10.3)
Chloride: 109 mmol/L (ref 98–111)
Creatinine, Ser: 1.13 mg/dL (ref 0.61–1.24)
GFR calc Af Amer: >60 mL/min (ref >60)
GFR calc non Af Amer: >60 mL/min (ref >60)
Glucose, Bld: 109 mg/dL — ABNORMAL HIGH (ref 70–99)
Potassium: 3.9 mmol/L (ref 3.5–5.1)
Sodium: 136 mmol/L (ref 135–145)

## 2020-07-17 LAB — OSMOLALITY, URINE: Osmolality, Ur: 592 mOsm/kg (ref 300–900)

## 2020-07-17 LAB — NA AND K (SODIUM & POTASSIUM), RAND UR
Potassium Urine: 38 mmol/L
Sodium, Ur: 111 mmol/L

## 2020-07-17 LAB — CHLORIDE, URINE, RANDOM: Chloride Urine: 87 mmol/L

## 2020-07-17 LAB — PHOSPHORUS: Phosphorus: 3.3 mg/dL (ref 2.5–4.6)

## 2020-07-17 LAB — ALBUMIN: Albumin: 1.9 g/dL — ABNORMAL LOW (ref 3.5–5.0)

## 2020-07-17 LAB — MAGNESIUM: Magnesium: 1.7 mg/dL (ref 1.7–2.4)

## 2020-07-17 MED ORDER — GABAPENTIN 300 MG PO CAPS
300.0000 mg | ORAL_CAPSULE | Freq: Every day | ORAL | Status: DC
  Administered 2020-07-18: 300 mg via ORAL
  Filled 2020-07-17 (×2): qty 1

## 2020-07-17 MED ORDER — OXYCODONE HCL 5 MG PO TABS
5.0000 mg | ORAL_TABLET | Freq: Four times a day (QID) | ORAL | Status: DC | PRN
  Administered 2020-07-17 – 2020-07-19 (×7): 10 mg via ORAL
  Administered 2020-07-19: 5 mg via ORAL
  Administered 2020-07-19 – 2020-07-20 (×5): 10 mg via ORAL
  Filled 2020-07-17 (×2): qty 2
  Filled 2020-07-17: qty 1
  Filled 2020-07-17 (×10): qty 2

## 2020-07-17 MED ORDER — HYDROMORPHONE HCL 1 MG/ML IJ SOLN
0.5000 mg | Freq: Four times a day (QID) | INTRAMUSCULAR | Status: DC | PRN
  Administered 2020-07-17 – 2020-07-19 (×2): 0.5 mg via INTRAVENOUS
  Filled 2020-07-17: qty 1

## 2020-07-17 MED ORDER — POLYETHYLENE GLYCOL 3350 17 G PO PACK
17.0000 g | PACK | Freq: Every day | ORAL | Status: DC
  Administered 2020-07-17: 17 g via ORAL
  Filled 2020-07-17: qty 1

## 2020-07-17 MED ORDER — ACETAMINOPHEN 500 MG PO TABS
1000.0000 mg | ORAL_TABLET | Freq: Three times a day (TID) | ORAL | Status: AC
  Administered 2020-07-17 – 2020-07-19 (×9): 1000 mg via ORAL
  Filled 2020-07-17 (×9): qty 2

## 2020-07-17 MED ORDER — POLYETHYLENE GLYCOL 3350 17 G PO PACK
17.0000 g | PACK | Freq: Two times a day (BID) | ORAL | Status: DC
  Administered 2020-07-17 – 2020-07-20 (×4): 17 g via ORAL
  Filled 2020-07-17 (×5): qty 1

## 2020-07-17 MED ORDER — GABAPENTIN 300 MG PO CAPS: 300.0000 mg | ORAL_CAPSULE | Freq: Three times a day (TID) | ORAL | Status: DC

## 2020-07-17 MED ORDER — INSULIN GLARGINE 100 UNIT/ML ~~LOC~~ SOLN
5.0000 [IU] | Freq: Every day | SUBCUTANEOUS | Status: DC
  Administered 2020-07-17 – 2020-07-19 (×3): 5 [IU] via SUBCUTANEOUS
  Filled 2020-07-17 (×4): qty 0.05

## 2020-07-17 NOTE — TOC Initial Note (Addendum)
Transition of Care [facility]) - Initial/Assessment Note    Patient Details  Name: [name] MRN: [id number] Date of Birth: [birth year]  Transition of Care [facility]) CM/SW Contact:    Emeterio Reeve, [place] Phone Number: 07/17/2020, 12:36 PM  Clinical Narrative:                  CSW met with pt at bedside. CSW introduced self and explained her role at the hospital.  Pt stated that PTA he wa living alone in his apartment. Pt stated he uses a wheelchair but is independent with ADL's. Pt states he can walk short distances and get himself to the bathroom.   CSW reviewed PT/Ot reccs of SNF with the pt. Pt states he agrees with the recc and states that he needs to be able to do more before he returns home. Pt states he has no preference of facility and gave CSW permission to send out to places in the area. CSW explained that she will give pt medicare.gov handout with places he was accepted.   Pt has not received the covid vaccine.   CSW will continue to follow.   Expected Discharge Plan: Skilled Nursing Facility Barriers to Discharge: Continued Medical Work up   Patient Goals and CMS Choice Patient states their goals for this hospitalization and ongoing recovery are:: To return back home CMS Medicare.gov Compare Post Acute Care list provided to:: Patient Choice offered to / list presented to : Patient  Expected Discharge Plan and Services Expected Discharge Plan: [place] arrangements for the past 2 months: Apartment                                      Prior Living Arrangements/Services Living arrangements for the past 2 months: Apartment Lives with:: Self Patient language and need for interpreter reviewed:: Yes Do you feel safe going back to the place where you live?: Yes      Need for Family Participation in Patient Care: Yes (Comment) Care giver support system in place?: Yes (comment) Current home services: DME Criminal Activity/Legal  Involvement Pertinent to Current Situation/Hospitalization: No - Comment as needed  Activities of Daily Living Home Assistive Devices/Equipment: Cane (specify quad or straight), Walker (specify type), Wheelchair ADL Screening (condition at time of admission) Patient's cognitive ability adequate to safely complete daily activities?: Yes Is the patient deaf or have difficulty hearing?: No Does the patient have difficulty seeing, even when wearing glasses/contacts?: No Does the patient have difficulty concentrating, remembering, or making decisions?: No Patient able to express need for assistance with ADLs?: Yes Does the patient have difficulty dressing or bathing?: Yes Independently performs ADLs?: No Communication: Independent Dressing (OT): Needs assistance Is this a change from baseline?: Pre-admission baseline Grooming: Needs assistance Is this a change from baseline?: Pre-admission baseline Feeding: Independent Is this a change from baseline?: Pre-admission baseline Bathing: Needs assistance Is this a change from baseline?: Pre-admission baseline Toileting: Needs assistance Is this a change from baseline?: Pre-admission baseline In/Out Bed: Needs assistance Is this a change from baseline?: Pre-admission baseline Does the patient have difficulty walking or climbing stairs?: Yes Weakness of Legs: Both Weakness of Arms/Hands: None  Permission Sought/Granted Permission sought to share information with : Facility Art therapist granted to share information with : Yes, Verbal Permission Granted     Permission granted to share info w AGENCY:  SNF        Emotional Assessment Appearance:: Appears stated age Attitude/Demeanor/Rapport: Engaged Affect (typically observed): Appropriate Orientation: : Oriented to Situation, Oriented to  Time, Oriented to Place, Oriented to Self Alcohol / Substance Use: Not Applicable Psych Involvement: No (comment)  Admission  diagnosis:  Back pain [M54.9] Pain [R52] Cellulitis of left hip [L03.116] Patient Active Problem List   Diagnosis Date Noted  . Purulent Cellulitis of left hip 07/15/2020  . Recurrent falls 07/15/2020  . Chronic pain 07/15/2020  . 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 Physical deconditioning 07/15/2020  . Asymptomatic bacteriuria 07/15/2020  . Diabetes mellitus ([place]) 01/28/2012   PCP:  Patient, No Pcp Per Pharmacy:   [id number] [place] [place]Phone: [phone]: [phone] [facility] PontesTransitions of [facility] [address]Phone: [phone]: [phone]    Social Determinants of Health (SDOH) Interventions    Readmission Risk Interventions No flowsheet data found.  MEmeterio Reeve LLatanya Presser LRockleighSocial Worker [phone]

## 2020-07-17 NOTE — Progress Notes (Addendum)
Subjective:   Hospital day:2   Overnight event: No  Patient examined at bedside. Reports continued pain in his left hip and leg.  He did have an episode of shooting pain from his lower back during examination.  He denied SOB or skin flushing or redness. Reports good PO intake. Last BM was 2 days ago.  Discussed plan for SNF placement.   Objective:  Vital signs in last 24 hours: Vitals:   07/16/20 0829 07/16/20 1300 07/16/20 1422 07/16/20 1949  BP: (!) 109/62 123/66 (!) 148/79 126/74  Pulse: 99 77 76 91  Resp: 17 17 17 15   Temp: 98.8 F (37.1 C) 99.9 F (37.7 C) 98.9 F (37.2 C) 98.2 F (36.8 C)  TempSrc: Oral Oral Oral Oral  SpO2: 99% 95% 95% 99%  Weight:      Height:        Physical Exam  Physical Exam Constitutional:      Comments: In acute distress due to shooting pain of the left leg and lower back  HENT:     Head: Normocephalic.  Eyes:     General: No scleral icterus.    Conjunctiva/sclera: Conjunctivae normal.  Cardiovascular:     Rate and Rhythm: Normal rate and regular rhythm.     Heart sounds: Normal heart sounds. No murmur heard.   Pulmonary:     Effort: Pulmonary effort is normal. No respiratory distress.     Breath sounds: Normal breath sounds.  Abdominal:     General: Bowel sounds are normal. There is no distension.     Tenderness: There is no abdominal tenderness.  Musculoskeletal:     Right lower leg: No edema.     Left lower leg: No edema.     Comments: Tender to palpation of left lower leg.  Pedis pulse palpated  Skin:    General: Skin is warm.     Comments: Left lateral hip wound was cover with bandage.  Noted some pus draining.  No skin flushing or redness visualized.  Neurological:     Mental Status: He is alert.  Psychiatric:        Mood and Affect: Mood normal.        Behavior: Behavior normal.     Assessment/Plan: [name] is a 64 yo male with PMH of DM, HTN, HLD, and chronic pain syndrome who present to the hospital for  chief complain of hip pain after a fall.Currently treating for Cellulitis of Left hip.  PT recommended SNF placement.  Patient will need a PCP when he is discharged.  We work together with family to coordinate his discharge plan.  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  Wound still draining pus.  Patient still complaining of pain at wound site and also left hip and leg.  No sign of systemic infection.  White blood count trending down from 12.6-10.1.  He is afebrile.  Wound Gram stain came back showed gram-positive cocci in pair likely to be strep pneumo.  Culture still pending.  We will continue vancomycin until wound culture comes back.  Continue to monitor renal function and "red man" syndrome. - Continue Vancomycin (day 3). Antibiotics duration 7-14 days. Will deescalate when wound culture comes back - Pending wound culture - Pending blood culture -  CBC daily    Left leg pain Chronic pain syndrome His leg pain appears to be neurological pain.  Patient does have diabetes and was not on any medication.  Will increase the dosage of his pain regiment and add gabapentin for neuropathy. - Increased oxy to 5-10 q6 prn - Increased hydromorphone to 0.5 q6h prn - Add gabapentin 300 mg once a day and will titrate up - Bowel regimen with Dulcolax and scheduled Miralax twice daily.   Normal anion gap metabolic acidosis with respiratory alkalosis ABG shows pH of 7.426, CO2 of 29 and bicarbonate 19.  Anion gap of 9.  Winters formula calculated PCO2 to be 35.  This is likely a normal anion gap metabolic acidosis with associated respiratory alkalosis.  Will start work-up for RTA which includes calculating urinary anion gap and measured urine pH. -Repeat UA.  First UA in the ED was  contaminated -Pending urine sodium, potassium and chloride.  -Pending urine osmolality   AKI Hematuria  Baseline creatine 1.09 in 2013 and 1.00 in 2010. His UA on admission shows small Hgb and large leukocytes with rare bacteria. Patient has history of smoking so an underlying malignancy is concerned. Renal US did not show any abnormality that explains the hematuria. PSA 1.06. Will continue to monitor. Patient will need to follow up with a PCP and urologist to further evaluate.  - BMP in AM -Continue to monitor renal function because patient is on vancomycin - Encourage PO fluid intake   Hypocalcemia Calcium trending down to 7.9 today.  Magnesium and phosphorus came back normal.  Albumin came back 1.9.  Calculated corrected calcium was 9.6 based on low albumin.  Continue to monitor. -BMP in a.m.   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 Patient lives alone at home. His only family member that lives nearby is his brother. Patient has not seen a PCP for a long time and not taking any medications for his chronic medical conditions. Patient will need TOC and home living situation arranged for his safety.  - PT recommended SNF - Consult TOC for PCP and SNF   DM Patient is not taking Insulin at home for a while.  -Hemoglobin A1c 6.9 -Continue Lantus 5 units daily - SSI  - CBG monitor   Normocytic anemia  Baseline Hgb around 12-13 on 2013. Current Hgb 10.8. Iron study shows Ferritin of 345, and iron sat of 19%, which consistent with anemia of chronic disease. - Continue to monitor BC daily  Diet: carb modified IVF: No VTE: Lovenox CODE: Full   Prior to Admission Living Arrangement: home Anticipated Discharge Location: SNF vs home health Barriers to Discharge: TOC and wound culture Dispo: Anticipated discharge in approximately 2-3 day(s).   Walter Stabler, DO 07/17/2020,  6:38 AM Pager: [phone] After 5pm on weekdays and 1pm on weekends: On Call pager [phone]

## 2020-07-17 NOTE — Hospital Course (Addendum)
Purulent cellulitis of left hip.  [name] is a 64 yo male with PMH of DM, HTN, HLD, and chronic pain syndrome who present to the hospital for chief complain of hip pain after a fall which he contributes to his lower back problem.  Patient has had frequent falls in the past few months. He has not seen a physicians for a while and was not on any medications for at least a month.  X-ray of of left lumbar, leg, knee and ankle showed no evidence of fracture.  CT left hip shows large area of soft tissue ulceration lateral to the left greater trochanter with air extending approximately 7 cm deep to the skin surface, consistent with cellulitis. No osteomyelitis noted.  Patient was admitted for treatment of purulent cellulitis and transition of care.  Wound culture show MSSA which was pan sensitive.  Vancomycin was started and de-escalate to oral doxycycline for the total course of treatment of 14 days.  Patient was discharged to a SNF for rehab.   Chronic pain syndrome Leg and hip pain Tylenol, oxycodone and Dilaudid were given for pain controlled. 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  Due to his frequent falls and lost to follow-up with his primary doctor, patient will need TOC and home living situation arranged for safety.  Physical therapist recommended SNF placement.  He will also need a primary care physician after discharge and follow-up with his pain management physician to better control his pain.   AKI Hematuria  Baseline creatine 1.09 in 2013 and 1.00 in 2010. Patient received 1L of NS bolus in the ER . Creatine is back to baseline at 1.05. His UA on admission shows small Hgb and large leukocytes with rare bacteria. Patient has history of smoking so an underlying malignancy is concerned. Renal US did not show any abnormality that explains the hematuria. PSA 1.06. Will continue to monitor. Patient will need to follow up with a Urologist to further evaluate.

## 2020-07-17 NOTE — NC FL2 (Signed)
[place] MEDICAID FL2 LEVEL OF CARE SCREENING TOOL     IDENTIFICATION  Patient Name: [name] Birthdate: 1956/10/28 Sex: male Admission Date (Current Location): 07/15/2020  [facility] and [place] Number:  Producer, television/film/video and Address:  [facility], [address] [place], [place] [address]      Provider Number: [id number]  Attending Physician Name and Address:  Inez Catalina, MD  Relative Name and Phone Number:       Current Level of Care: Hospital Recommended Level of Care: Skilled Nursing Facility Prior Approval Number:    Date Approved/Denied:   PASRR Number: [id number]  Discharge Plan: SNF    Current Diagnoses: Patient Active Problem List   Diagnosis Date Noted  . Purulent Cellulitis of left hip 07/15/2020  . Recurrent falls 07/15/2020  . Chronic pain 07/15/2020  . 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 Physical deconditioning 07/15/2020  . Asymptomatic bacteriuria 07/15/2020  . Diabetes mellitus (HCC) 01/28/2012    Orientation RESPIRATION BLADDER Height & Weight     Self, Time, Situation, Place  Normal Incontinent Weight: (!) 332 lb 0.2 oz (150.6 kg) Height:  5\' 11"  (180.3 cm)  BEHAVIORAL SYMPTOMS/MOOD NEUROLOGICAL BOWEL NUTRITION STATUS      Incontinent Diet (See d/c summary)  AMBULATORY STATUS COMMUNICATION OF NEEDS Skin   Extensive Assist Verbally Skin abrasions (hip)                       Personal Care Assistance Level of Assistance  Bathing, Feeding, Dressing Bathing Assistance: Maximum assistance Feeding assistance: Limited assistance Dressing Assistance: Maximum assistance     Functional Limitations Info  Speech, Hearing, Sight Sight Info: Adequate Hearing Info: Adequate Speech Info: Adequate    SPECIAL CARE FACTORS FREQUENCY  OT (By licensed OT), PT (By licensed PT)     PT Frequency: 5x a week OT Frequency: 5x a week            Contractures Contractures Info: Not present     Additional Factors Info  Code Status, Allergies Code Status Info: Full Allergies Info: Lipitor           Current Medications (07/17/2020):  This is the current hospital active medication list Current Facility-Administered Medications  Medication Dose Route Frequency Provider Last Rate Last Admin  . acetaminophen (TYLENOL) tablet 1,000 mg  1,000 mg Oral TID 07/19/2020, MD   1,000 mg at 07/17/20 1109  . bisacodyl (DULCOLAX) EC tablet 5 mg  5 mg Oral Daily PRN Agyei, Obed K, MD      . enoxaparin (LOVENOX) injection 75 mg  0.5 mg/kg Subcutaneous Q24H Agyei, Obed K, MD   75 mg at 07/17/20 1253  . [START ON 07/18/2020] gabapentin (NEURONTIN) capsule 300 mg  300 mg Oral Daily 07/20/2020, DO      . HYDROmorphone (DILAUDID) injection 0.5 mg  0.5 mg Intravenous Q6H PRN Doran Stabler, MD   0.5 mg at 07/17/20 0947  . insulin aspart (novoLOG) injection 0-15 Units  0-15 Units Subcutaneous TID WC 02-20-1985, MD   3 Units at 07/17/20 1252  . insulin glargine (LANTUS) injection 5 Units  5 Units Subcutaneous QHS Agyei, Obed K, MD      . oxyCODONE (Oxy IR/ROXICODONE) immediate release tablet 5-10 mg  5-10 mg Oral Q6H PRN 07/19/20, MD   10 mg at 07/17/20 1300  . polyethylene glycol (MIRALAX / GLYCOLAX) packet 17 g  17 g  Oral BID Doran Stabler, DO      . vancomycin (VANCOREADY) IVPB 1250 mg/250 mL  1,250 mg Intravenous Q12H Agyei, Obed K, MD 166.7 mL/hr at 07/17/20 1252 1,250 mg at 07/17/20 1252     Discharge Medications: Please see discharge summary for a list of discharge medications.  Relevant Imaging Results:  Relevant Lab Results:   Additional Information SSN [id number]  Jimmy Picket, [place]

## 2020-07-18 LAB — BASIC METABOLIC PANEL
Anion gap: 9 (ref 5–15)
BUN: 14 mg/dL (ref 8–23)
CO2: 17 mmol/L — ABNORMAL LOW (ref 22–32)
Calcium: 8 mg/dL — ABNORMAL LOW (ref 8.9–10.3)
Chloride: 109 mmol/L (ref 98–111)
Creatinine, Ser: 0.93 mg/dL (ref 0.61–1.24)
GFR calc Af Amer: >60 mL/min (ref >60)
GFR calc non Af Amer: >60 mL/min (ref >60)
Glucose, Bld: 106 mg/dL — ABNORMAL HIGH (ref 70–99)
Potassium: 4 mmol/L (ref 3.5–5.1)
Sodium: 135 mmol/L (ref 135–145)

## 2020-07-18 LAB — CBC
HCT: 31.1 % — ABNORMAL LOW (ref 39.0–52.0)
Hemoglobin: 10.5 g/dL — ABNORMAL LOW (ref 13.0–17.0)
MCH: 31.2 pg (ref 26.0–34.0)
MCHC: 33.8 g/dL (ref 30.0–36.0)
MCV: 92.3 fL (ref 80.0–100.0)
Platelets: 236 10*3/uL (ref 150–400)
RBC: 3.37 MIL/uL — ABNORMAL LOW (ref 4.22–5.81)
RDW: 13.9 % (ref 11.5–15.5)
WBC: 9.6 10*3/uL (ref 4.0–10.5)
nRBC: 0 % (ref 0.0–0.2)

## 2020-07-18 LAB — GLUCOSE, CAPILLARY
Glucose-Capillary: 139 mg/dL — ABNORMAL HIGH (ref 70–99)
Glucose-Capillary: 104 mg/dL — ABNORMAL HIGH (ref 70–99)
Glucose-Capillary: 127 mg/dL — ABNORMAL HIGH (ref 70–99)
Glucose-Capillary: 142 mg/dL — ABNORMAL HIGH (ref 70–99)

## 2020-07-18 LAB — VITAMIN D 25 HYDROXY (VIT D DEFICIENCY, FRACTURES): Vit D, 25-Hydroxy: 7.31 ng/mL — ABNORMAL LOW (ref 30–100)

## 2020-07-18 LAB — CALCIUM, IONIZED: Calcium, Ionized, Serum: 5 mg/dL (ref 4.5–5.6)

## 2020-07-18 LAB — SARS CORONAVIRUS 2 (TAT 6-24 HRS): SARS Coronavirus 2: NEGATIVE

## 2020-07-18 LAB — TSH: TSH: 3.665 u[IU]/mL (ref 0.350–4.500)

## 2020-07-18 MED ORDER — VITAMIN D 25 MCG (1000 UNIT) PO TABS
1000.0000 [IU] | ORAL_TABLET | Freq: Every day | ORAL | Status: DC
  Administered 2020-07-18 – 2020-07-20 (×3): 1000 [IU] via ORAL
  Filled 2020-07-18 (×3): qty 1

## 2020-07-18 MED ORDER — DOXYCYCLINE HYCLATE 100 MG PO TABS
100.0000 mg | ORAL_TABLET | Freq: Two times a day (BID) | ORAL | Status: DC
  Administered 2020-07-18 – 2020-07-20 (×5): 100 mg via ORAL
  Filled 2020-07-18 (×5): qty 1

## 2020-07-18 MED ORDER — DOXYCYCLINE HYCLATE 100 MG PO TABS: 100.0000 mg | ORAL_TABLET | Freq: Two times a day (BID) | ORAL | Status: DC

## 2020-07-18 MED ORDER — LACTATED RINGERS IV SOLN: INTRAVENOUS | Status: AC

## 2020-07-18 MED ORDER — GABAPENTIN 300 MG PO CAPS
300.0000 mg | ORAL_CAPSULE | Freq: Two times a day (BID) | ORAL | Status: DC
  Administered 2020-07-18: 300 mg via ORAL
  Filled 2020-07-18: qty 1

## 2020-07-18 MED ORDER — SENNA 8.6 MG PO TABS
2.0000 | ORAL_TABLET | Freq: Every day | ORAL | Status: DC
  Administered 2020-07-18 – 2020-07-20 (×3): 17.2 mg via ORAL
  Filled 2020-07-18 (×3): qty 2

## 2020-07-18 MED ORDER — SODIUM BICARBONATE 650 MG PO TABS: 650.0000 mg | ORAL_TABLET | Freq: Three times a day (TID) | ORAL | Status: DC

## 2020-07-18 NOTE — TOC Progression Note (Signed)
Transition of Care [facility]) - Progression Note    Patient Details  Name: ANDRES BANTZ MRN: [id number] Date of Birth: 1956/08/17  Transition of Care [facility]) CM/SW Contact  Jimmy Picket, [place] Phone Number: 07/18/2020, 2:58 PM  Clinical Narrative:     CSW spoke to pt about bed choices. Pt chose [facility]. CSW notified [place] [facility] of choice and they will have a bed available for him.  CSW started insurance auth. Reference number is [id number]. CSW requested new covid test.   Expected Discharge Plan: Skilled Nursing Facility Barriers to Discharge: Continued Medical Work up  Expected Discharge Plan and Services Expected Discharge Plan: Skilled Nursing Facility       Living arrangements for the past 2 months: Apartment                                       Social Determinants of Health (SDOH) Interventions    Readmission Risk Interventions No flowsheet data found.  Jimmy Picket, Theresia Majors, [place] Clinical Social Worker [phone]
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
## 2020-07-19 LAB — COMPREHENSIVE METABOLIC PANEL
ALT: 24 U/L (ref 0–44)
AST: 20 U/L (ref 15–41)
Albumin: 1.8 g/dL — ABNORMAL LOW (ref 3.5–5.0)
Alkaline Phosphatase: 73 U/L (ref 38–126)
Anion gap: 8 (ref 5–15)
BUN: 15 mg/dL (ref 8–23)
CO2: 20 mmol/L — ABNORMAL LOW (ref 22–32)
Calcium: 8.1 mg/dL — ABNORMAL LOW (ref 8.9–10.3)
Chloride: 109 mmol/L (ref 98–111)
Creatinine, Ser: 1 mg/dL (ref 0.61–1.24)
GFR calc Af Amer: >60 mL/min (ref >60)
GFR calc non Af Amer: >60 mL/min (ref >60)
Glucose, Bld: 110 mg/dL — ABNORMAL HIGH (ref 70–99)
Potassium: 4.2 mmol/L (ref 3.5–5.1)
Sodium: 137 mmol/L (ref 135–145)
Total Bilirubin: 0.8 mg/dL (ref 0.3–1.2)
Total Protein: 5.3 g/dL — ABNORMAL LOW (ref 6.5–8.1)

## 2020-07-19 LAB — CBC
HCT: 31.5 % — ABNORMAL LOW (ref 39.0–52.0)
Hemoglobin: 10.2 g/dL — ABNORMAL LOW (ref 13.0–17.0)
MCH: 30 pg (ref 26.0–34.0)
MCHC: 32.4 g/dL (ref 30.0–36.0)
MCV: 92.6 fL (ref 80.0–100.0)
Platelets: 256 10*3/uL (ref 150–400)
RBC: 3.4 MIL/uL — ABNORMAL LOW (ref 4.22–5.81)
RDW: 14 % (ref 11.5–15.5)
WBC: 8.3 10*3/uL (ref 4.0–10.5)
nRBC: 0 % (ref 0.0–0.2)

## 2020-07-19 LAB — GLUCOSE, CAPILLARY
Glucose-Capillary: 89 mg/dL (ref 70–99)
Glucose-Capillary: 111 mg/dL — ABNORMAL HIGH (ref 70–99)
Glucose-Capillary: 166 mg/dL — ABNORMAL HIGH (ref 70–99)
Glucose-Capillary: 92 mg/dL (ref 70–99)

## 2020-07-19 MED ORDER — DOXYCYCLINE HYCLATE 100 MG PO TABS: 100.0000 mg | ORAL_TABLET | Freq: Two times a day (BID) | ORAL | 0 refills | Status: DC

## 2020-07-19 MED ORDER — VITAMIN D3 25 MCG PO TABS: 1000.0000 [IU] | ORAL_TABLET | Freq: Every day | ORAL | 0 refills | Status: AC

## 2020-07-19 MED ORDER — OXYCODONE HCL 5 MG PO TABS: 10.0000 mg | ORAL_TABLET | Freq: Four times a day (QID) | ORAL | 0 refills | Status: DC | PRN

## 2020-07-19 MED ORDER — LACTATED RINGERS IV SOLN: INTRAVENOUS | Status: DC

## 2020-07-19 MED ORDER — GABAPENTIN 300 MG PO CAPS
300.0000 mg | ORAL_CAPSULE | Freq: Three times a day (TID) | ORAL | Status: DC
  Administered 2020-07-19 (×3): 300 mg via ORAL
  Filled 2020-07-19 (×3): qty 1

## 2020-07-19 MED ORDER — GABAPENTIN 300 MG PO CAPS: 300.0000 mg | ORAL_CAPSULE | Freq: Three times a day (TID) | ORAL | 0 refills | Status: DC

## 2020-07-19 NOTE — Plan of Care (Signed)
  Problem: Health Behavior/Discharge Planning: Goal: Ability to manage health-related needs will improve Outcome: Progressing   Problem: Clinical Measurements: Goal: Will remain free from infection Outcome: Progressing Goal: Diagnostic test results will improve Outcome: Progressing   Problem: Activity: Goal: Risk for activity intolerance will decrease Outcome: Progressing   Problem: Nutrition: Goal: Adequate nutrition will be maintained Outcome: Progressing   Problem: Elimination: Goal: Will not experience complications related to bowel motility Outcome: Progressing Goal: Will not experience complications related to urinary retention Outcome: Progressing   Problem: Pain Managment: Goal: General experience of comfort will improve Outcome: Progressing

## 2020-07-19 NOTE — TOC Progression Note (Signed)
Transition of Care [facility]) - Progression Note    Patient Details  Name: [name] MRN: [id number] Date of Birth: 21-Oct-1956  Transition of Care [facility]) CM/SW Contact  Epifanio Lesches, RN Phone Number: 07/19/2020, 1:41 PM  Clinical Narrative:    Insurance authorization for SNF placement  still under review.   TOC team will continue to monitor.   Expected Discharge Plan: Skilled Nursing Facility [facility]) Barriers to Discharge: Inadequate or no insurance  Expected Discharge Plan and Services Expected Discharge Plan: Skilled Nursing Facility [facility])       Living arrangements for the past 2 months: Apartment Expected Discharge Date: 07/19/20                                     Social Determinants of Health (SDOH) Interventions    Readmission Risk Interventions No flowsheet data found.

## 2020-07-19 NOTE — Plan of Care (Signed)
  Problem: Health Behavior/Discharge Planning: Goal: Ability to manage health-related needs will improve Outcome: Progressing   Problem: Clinical Measurements: Goal: Will remain free from infection Outcome: Progressing Goal: Diagnostic test results will improve Outcome: Progressing   Problem: Activity: Goal: Risk for activity intolerance will decrease Outcome: Progressing   Problem: Nutrition: Goal: Adequate nutrition will be maintained Outcome: Progressing   Problem: Elimination: Goal: Will not experience complications related to bowel motility Outcome: Progressing Goal: Will not experience complications related to urinary retention Outcome: Progressing   Problem: Elimination: Goal: Will not experience complications related to bowel motility Outcome: Progressing Goal: Will not experience complications related to urinary retention Outcome: Progressing   Problem: Pain Managment: Goal: General experience of comfort will improve Outcome: Progressing

## 2020-07-19 NOTE — Discharge Summary (Addendum)
Name: [name] MRN: [id number] DOB: [birth year] 64 y.o. PCP: Patient, No Pcp Per  Date of Admission: 07/15/2020  4:28 AM Date of Discharge: 07/20/2020 Attending Physician: Inez Catalina, MD  Discharge Diagnosis: 1.  Purulent cellulitis of left hip 2.  Hematuria 3.  Chronic pain syndrome 4.  Metabolic acidosis  5.  Vitamin D deficiency 6.  Frequent fall  Discharge Medications: Allergies as of 07/20/2020      Reactions   Lipitor [atorvastatin Calcium] Other (See Comments)   Muscle pain      Medication List    STOP taking these medications   cyclobenzaprine 5 MG tablet Commonly known as: FLEXERIL   HumuLIN N 100 UNIT/ML injection Generic drug: insulin NPH Human   losartan 50 MG tablet Commonly known as: COZAAR   oxyCODONE-acetaminophen 10-325 MG tablet Commonly known as: PERCOCET     TAKE these medications   acetaminophen 325 MG tablet Commonly known as: Tylenol Take 2 tablets (650 mg total) by mouth every 6 (six) hours as needed.   doxycycline 100 MG tablet Commonly known as: VIBRA-TABS Take 1 tablet (100 mg total) by mouth 2 (two) times daily for 8 days.   gabapentin 400 MG capsule Commonly known as: NEURONTIN Take 1 capsule (400 mg total) by mouth 3 (three) times daily.   oxyCODONE 5 MG immediate release tablet Commonly known as: Oxy IR/ROXICODONE Take 2 tablets (10 mg total) by mouth every 6 (six) hours as needed for moderate pain or severe pain.   polyethylene glycol 17 g packet Commonly known as: MIRALAX / GLYCOLAX Take 17 g by mouth 2 (two) times daily for 14 days.   Vitamin D3 25 MCG tablet Commonly known as: Vitamin D Take 1 tablet (1,000 Units total) by mouth daily. What changed:   medication strength  how much to take            Discharge Care Instructions  (From admission, onward)         Start     Ordered   07/19/20 0000  Discharge wound care:       Comments: Follow instructions per wound care nurse   07/19/20 1008           Disposition and follow-up:   [name] was discharged from [facility] in Stable condition.  At the hospital follow up visit please address:  1.  Purulent cellulitis  Wound culture shows MSSA pansensitive.  Vancomycin was started and deescalated to doxycycline oral twice daily for total duration of 14 days.  Please assess if the wound is healing appropriately.  Frequent fall It is unsafe for patient to live at home alone.  Patient also has not seen a physician for a while and is not on any medications at home.  Patient needs to be on medicines for his chronic medical conditions.  Chronic pain syndrome Patient have chronic lower back pain with shooting pain of left leg currently on oxycodone and gabapentin.  He needs a follow-up with a pain management physician for better control of his pain.  Hematuria UA shows small hemoglobin.  Renal ultrasound did not shows any abnormalities.  PSA was normal.  Patient may need to follow-up with a urologist for further evaluation, especially because patient has history of smoking.  Vitamin D deficiency Vitamin D3 1000 units was started.  Please recheck vitamin D level in 3 months.  2.  Labs / imaging needed at time of follow-up: No  3.  Pending labs/  test needing follow-up: Blood culture  Follow-up Appointments:  Contact information for follow-up providers    Management, [facility]. Schedule an appointment as soon as possible for a visit in 1 week(s).   Specialty: Pain Medicine Why: Hospital follow-up Contact information: [address] [place] [address] [phone]            Contact information for after-discharge care    Destination    [facility] Preferred SNF .   Service: Skilled Nursing Contact information: [address] [place] [address] [phone]                  Hospital Course by problem list: 1. Purulent cellulitis of left  hip.  [name] is a 64 yo male with PMH of DM, HTN, HLD, and chronic pain syndrome who present to the hospital for chief complain of hip pain after a fall which he contributes to his lower back problem.  Patient has had frequent falls in the past few months. He has not seen a physicians for a while and was not on any medications for at least a month.  X-ray of of left lumbar, leg, knee and ankle showed no evidence of fracture.  CT left hip shows large area of soft tissue ulceration lateral to the left greater trochanter with air extending approximately 7 cm deep to the skin surface, consistent with cellulitis. No osteomyelitis noted.  Patient was admitted for treatment of purulent cellulitis and transition of care.  Wound culture show MSSA which was pan sensitive.  Vancomycin was started and de-escalate to oral doxycycline for the total course of treatment of 14 days.  Patient was discharged to a SNF for rehab.   Chronic pain syndrome Leg and hip pain Tylenol, oxycodone and Dilaudid were given for pain controlled. 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  Due to his frequent falls and lost to follow-up with his primary doctor, patient will need TOC and home living situation arranged for safety.  Physical therapist recommended SNF placement.  He will also need a primary care physician after discharge and follow-up with his pain management physician to better control his pain.   AKI Hematuria  Baseline creatine 1.09 in 2013 and 1.00 in 2010. Patient received 1L of NS bolus in the ER . Creatine is back to baseline at 1.05. His UA on admission shows small Hgb and large leukocytes with rare bacteria. Patient has history of smoking so an underlying malignancy is concerned. Renal US did not show any abnormality that explains the hematuria. PSA 1.06. Will continue to monitor. Patient will need to follow up with a Urologist to further evaluate.    Discharge Vitals:   BP 127/68 (BP  Location: Left Arm)   Pulse 80   Temp 97.6 F (36.4 C) (Oral)   Resp 20   Ht 5\' 11"  (1.803 m)   Wt (!) 150.6 kg   SpO2 100%   BMI 46.31 kg/m   Pertinent Labs, Studies, and Procedures:  CBC Latest Ref Rng & Units 07/20/2020 07/19/2020 07/18/2020  WBC 4.0 - 10.5 K/uL 8.5 8.3 9.6  Hemoglobin 13.0 - 17.0 g/dL 11.3(L) 10.2(L) 10.5(L)  Hematocrit 39 - 52 % 34.4(L) 31.5(L) 31.1(L)  Platelets 150 - 400 K/uL 307 256 236   BMP Latest Ref Rng & Units 07/20/2020 07/19/2020 07/18/2020  Glucose 70 - 99 mg/dL 07/20/2020) 790(W) 409(B)  BUN 8 - 23 mg/dL 13 15 14   Creatinine 0.61 - 1.24  mg/dL 1.61 0.96 0.45  Sodium 135 - 145 mmol/L 137 137 135  Potassium 3.5 - 5.1 mmol/L 3.9 4.2 4.0  Chloride 98 - 111 mmol/L 107 109 109  CO2 22 - 32 mmol/L 22 20(L) 17(L)  Calcium 8.9 - 10.3 mg/dL 8.3(L) 8.1(L) 8.0(L)   TSH 3.66  Vitamin D 7.31  CT HIP LEFT W CONTRAST  Result Date: 07/15/2020 CLINICAL DATA:  Left-sided hip pain for 3 months. History of acetabular reconstruction. Soft tissue infection suspected. EXAM: CT OF THE LOWER LEFT EXTREMITY WITH CONTRAST TECHNIQUE: Multidetector CT imaging of the left hip was performed according to the standard protocol following intravenous contrast administration. COMPARISON:  Radiographs 07/15/2020 and 06/12/2008. CONTRAST:  OMNIPAQUE IOHEXOL 300 MG/ML  SOLN FINDINGS: Bones/Joint/Cartilage Status post remote posterior plate and screw left acetabular reconstruction, present on the 2009 radiographs. The hardware appears intact without loosening. There is posttraumatic deformity of the posterior wall and column of the left acetabulum with mild articular surface irregularity and secondary left hip degenerative changes. There is fragmented heterotopic ossification adjacent to the greater trochanter. There is a small left hip joint effusion with probable intra-articular loose bodies. No evidence of acute fracture, dislocation, femoral head avascular necrosis or acute osteomyelitis.  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 There is moderate lower lumbar spondylosis. Ligaments Suboptimally assessed by CT. Muscles and Tendons Marked atrophy of the left gluteus medius and minimus muscles and the tensor fascia lata muscle. Soft tissues There is a large area of soft tissue ulceration lateral to the left greater trochanter with air extending approximately 7 cm deep to the skin surface. This air nearly abuts the greater trochanter. There are inflammatory changes surrounding this open wound with muscular thickening and an ill-defined fluid collection inferiorly, measuring up to 3.5 cm on sagittal image 103/7. No drainable collection identified. No intrapelvic inflammatory changes are seen. There is iliofemoral atherosclerosis. IMPRESSION: 1. Large area of soft tissue ulceration lateral to the left greater trochanter with air extending approximately 7 cm deep to the skin surface, consistent with cellulitis. Small ill-defined soft tissue fluid collection inferior to the ulcer, but no drainable collection identified. 2. No evidence of osteomyelitis or septic joint. 3. 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 Small left hip joint effusion with probable intra-articular loose bodies. 4. No evidence of acute fracture. 5. Marked chronic muscular atrophy as described. Electronically Signed   By: Carey Bullocks M.D.   On: 07/15/2020 10:46   DG Lumbar Spine 1 View  Result Date: 07/16/2020 CLINICAL DATA:  Low back pain EXAM: LUMBAR SPINE - 1 VIEW COMPARISON:  12/24/2009 FINDINGS: No gross fracture or subluxation. Very limited visualization due to overlapping artifact and underpenetration. There is generalized spondylitic spurring which has progressed from prior. Prior pelvic fixation. IMPRESSION: 1. Very limited study without gross fracture or subluxation. 2. Spondylosis which has progressed from a 2011 comparison.  Electronically Signed   By: Marnee Spring M.D.   On: 07/16/2020 06:59   US RENAL  Result Date: 07/16/2020 CLINICAL DATA:  Microscopic hematuria for 1 day. EXAM: RENAL / URINARY TRACT ULTRASOUND COMPLETE COMPARISON:  CT AP 05/07/2004 FINDINGS: Right Kidney: Renal measurements: 11.1 x 4.3 x 5.1 cm = volume: 127.3 mL . Echogenicity within normal limits. No mass or hydronephrosis visualized. Simple appearing anechoic cyst within upper pole of right kidney measures 1.7 x 1.9 x 1.3 cm. Left Kidney: Renal measurements: 10.7 x 5.1 x 5.2 cm =  volume: 147.4 mL. Echogenicity within normal limits. No mass or hydronephrosis visualized. Bladder: Appears normal for degree of bladder distention. Other: None IMPRESSION: No findings by ultrasound to explain patient's hematuria. Electronically Signed   By: Signa Kell M.D.   On: 07/16/2020 09:40   DG Knee Left Port  Result Date: 07/15/2020 CLINICAL DATA:  Left lower extremity pain EXAM: PORTABLE LEFT KNEE - 1-2 VIEW COMPARISON:  None. FINDINGS: No evidence of fracture, dislocation, or joint effusion. No evidence of arthropathy or other focal bone abnormality. Soft tissues are unremarkable. IMPRESSION: Negative. Electronically Signed   By: Deatra Robinson M.D.   On: 07/15/2020 06:35   DG Ankle Left Port  Result Date: 07/15/2020 CLINICAL DATA:  Left lower extremity pain EXAM: PORTABLE LEFT ANKLE - 2 VIEW COMPARISON:  None. FINDINGS: There is no evidence of fracture, dislocation, or joint effusion. There is no evidence of arthropathy or other focal bone abnormality. Soft tissues are unremarkable. IMPRESSION: Negative. Electronically Signed   By: Deatra Robinson M.D.   On: 07/15/2020 06:35   DG Hip Port Unilat W or Wo Pelvis 1 View Left  Result Date: 07/15/2020 CLINICAL DATA:  Left hip wound EXAM: DG HIP (WITH OR WITHOUT PELVIS) 1V PORT LEFT COMPARISON:  None. FINDINGS: There is a drainage tract at the lateral soft tissues of the left hip. No retained foreign body. There  is screw and plate fixation hardware along the left iliac bone. Advanced left hip degenerative change. No evidence of active infectious osteitis. IMPRESSION: Drainage tract at the lateral soft tissues of the left hip. No retained foreign body. Electronically Signed   By: Deatra Robinson M.D.   On: 07/15/2020 06:31   VAS [place] LOWER EXTREMITY VENOUS (DVT)  Result Date: 07/16/2020  Lower Venous DVTStudy Indications: Swelling, and Pain.  Comparison Study: no prior Performing Technologist: Blanch Media RVS  Examination Guidelines: A complete evaluation includes B-mode imaging, spectral Doppler, color Doppler, and power Doppler as needed of all accessible portions of each vessel. Bilateral testing is considered an integral part of a complete examination. Limited examinations for reoccurring indications may be performed as noted. The reflux portion of the exam is performed with the patient in reverse Trendelenburg.  +-----+---------------+---------+-----------+----------+--------------+ RIGHTCompressibilityPhasicitySpontaneityPropertiesThrombus Aging +-----+---------------+---------+-----------+----------+--------------+ CFV  Full           Yes      Yes                                 +-----+---------------+---------+-----------+----------+--------------+   +---------+---------------+---------+-----------+----------+--------------+ LEFT     CompressibilityPhasicitySpontaneityPropertiesThrombus Aging +---------+---------------+---------+-----------+----------+--------------+ CFV      Full           Yes      Yes                                 +---------+---------------+---------+-----------+----------+--------------+ SFJ      Full                                                        +---------+---------------+---------+-----------+----------+--------------+ FV Prox  Full                                                         +---------+---------------+---------+-----------+----------+--------------+  FV Mid   Full                                                        +---------+---------------+---------+-----------+----------+--------------+ FV DistalFull                                                        +---------+---------------+---------+-----------+----------+--------------+ PFV      Full                                                        +---------+---------------+---------+-----------+----------+--------------+ POP      Full           Yes      Yes                                 +---------+---------------+---------+-----------+----------+--------------+ PTV      Full                                                        +---------+---------------+---------+-----------+----------+--------------+ PERO     Full                                                        +---------+---------------+---------+-----------+----------+--------------+     Summary: RIGHT: - No evidence of common femoral vein obstruction.  LEFT: - There is no evidence of deep vein thrombosis in the lower extremity.  - No cystic structure found in the popliteal fossa.  *See table(s) above for measurements and observations. Electronically signed by Fabienne Brunsharles Fields MD on 07/16/2020 at 7:32:55 PM.    Final     Discharge Instructions: Discharge Instructions    Call MD for:  severe uncontrolled pain   Complete by: As directed    Diet - low sodium heart healthy   Complete by: As directed    Discharge instructions   Complete by: As directed    [name],  It was a pleasure taking care of you during this admission.  You were admitted for purulent cellulitis of your left hip and was treated with antibiotics.  You will be going to a nursing facility for rehab. Please take doxycycline 2 times a day for 8 more days. Please continue to take your pain medications as instructed. Please follow-up with with a  primary care physician and also your pain management physician.  Take care, Walter StablerQuan Auden Wettstein, DO   Discharge wound care:   Complete by: As directed    Follow instructions per wound care nurse   Increase activity slowly   Complete by: As directed       Signed: Cyndie ChimeNguyen,  Cloretta Ned, DO 07/20/2020, 12:50 PM   Pager: [phone]

## 2020-07-19 NOTE — Progress Notes (Signed)
Subjective:   Hospital day: 4  Overnight event: No  Patient is seen at bedside.  He does not appear in any acute distress.  Patient reports his pain is improved somewhat, but still has shooting pain. Also has back pain. Also has abdominal pain and bloating since starting his bowel regimen but has not had a BM. Requests prune juice. He is agreeable to discharge to SNF today.  Objective:  Vital signs in last 24 hours: Vitals:   07/18/20 0830 07/18/20 1605 07/18/20 2134 07/19/20 0538  BP: (!) 131/64 (!) 114/55 (!) 119/60 115/66  Pulse: 87 84 89 77  Resp: 15 16 19 17   Temp: 98.6 F (37 C) 98.3 F (36.8 C) 99.5 F (37.5 C) 98 F (36.7 C)  TempSrc: Oral Oral Oral Oral  SpO2: 100% 100% 97% 98%  Weight:      Height:        Physical Exam  Physical Exam Constitutional:      General: He is not in acute distress.    Appearance: He is not toxic-appearing.  HENT:     Head: Normocephalic.  Eyes:     General: No scleral icterus.    Conjunctiva/sclera: Conjunctivae normal.  Cardiovascular:     Rate and Rhythm: Normal rate and regular rhythm.     Heart sounds: Normal heart sounds. No murmur heard.   Pulmonary:     Effort: Pulmonary effort is normal. No respiratory distress.  Abdominal:     General: Bowel sounds are normal. There is no distension.     Tenderness: There is no abdominal tenderness.  Musculoskeletal:        General: Tenderness (Neuropathic pain of left leg to palpation) present.     Right lower leg: No edema.     Left lower leg: No edema.  Skin:    General: Skin is warm.     Coloration: Skin is not jaundiced.     Comments: New bandage at the wound, still draining  Neurological:     Mental Status: He is alert.  Psychiatric:        Mood and Affect: Mood normal.        Behavior: Behavior normal.     Assessment/Plan: [name] is a 64 yo male with PMH of DM, HTN, HLD, and chronic pain syndrome who present to the hospital for chief complain of hip pain  after a fall.Currently treating for Cellulitis of Left hip. PT recommended SNF placement. Patient will need a PCP when he is discharged.Patient is going to a SNF.   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 Platelet count normalized at 8.3.  Patient still afebrile.  Doxycycline was started yesterday for MSSA pansensitive.  We will continue 10 more days of doxycycline for total duration of 14 days. - Doxycycline 100 mg twice daily (2/11) - Blood culture showed no growth on day 2 - CBC daily   Left leg pain Chronic pain syndrome His pain is improved compared to yesterday.  His pain is neuropathic in nature.  He needs a follow-up with a pain management physician for better control of the pain. - Continue scheduled Tylenol 1 g 3 times daily -Continue oxy to 5-10 q6 prn -Continue hydromorphone to 0.5 q6hprn - Increase gabapentin to 3 times daily -  Bowel regimen with Dulcolax andscheduledMiralaxand Senokot.   Metabolic acidosis with respiratoryalkalosis  ABG shows pH of 7.426, CO2 of 29 and bicarbonate 19. Anion gap of 9 but corrected anion gap was 15.5.  Delta delta ratios less than 1 which consistent with with a combination of AGMA and NAGMA.Winters formula calculated PCO2 to be 35 which is associated with additional respiratory alkalosis.  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 Bicarb improved to 29 after IV fluid yesterday. -Patient will need to follow-up with PCP for further evaluation   Hematuria Repeat UA still shows small hemoglobin.  Last renal US did not show any abnormality that explains the hematuria. PSA 1.06. Will continue to monitor. Patient will need to follow up with aPCP and urologist to further  evaluate. Hemoglobin stable   Hypocalcemia Magnesium and phosphorus came back normal. Albumin came back 1.9. Calculated corrected calcium was 9.6 based on low albumin.  Vitamin D was low at 7, so 5 and D3 1000 units was started and continue after discharge.  Patient will need to follow-up with PCP to recheck vitamin D level.   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 Patient lives alone at home. His only family member that lives nearby is his brother. Patient has not seen a PCP for a long time and not taking any medications for his chronic medical conditions. Patient will need TOC and home living situation arranged for his safety.  -SNF placement   DM Patient is not taking Insulin at home for a while. -Hemoglobin A1c 6.9 -Continue Lantus 5 units daily - SSI - CBG monitor   Normocytic anemia  Baseline Hgb around 12-13 on 2013. Current Hgb 10.8. Iron study shows Ferritin of 345, and iron sat of 19%, which consistent with anemia of chronic disease. - Continue to monitor CBC daily   Diet:carb modified IVF:No ERX:[id number] CODE:Full  Prior to Admission Living Arrangement:home Anticipated Discharge Location:SNF  Barriers to Discharge:None Dispo: Anticipated discharge today  Walter Stabler, DO 07/19/2020, 6:15 AM Pager: [phone] After 5pm on weekdays and 1pm on weekends: On Call pager [phone]

## 2020-07-19 NOTE — Progress Notes (Signed)
Physical Therapy Treatment Patient Details Name: [name] MRN: [id number] DOB: 1956-09-10 Today's Date: 07/19/2020    History of Present Illness Pt is a 64 yo male with PMH of DM, HTN, HLD, and chronic pain syndrome who present to the hospital for chief complaint of L hip pain after a fall. CT revealed no acute fx of L hip but was consistent with cellulitis, joing effusion, and degenerative changes.    PT Comments    Pt is progressing towards goals listed. Pt was in severe pain today (limited session and notified RN for medication). Pt required max assist +2 and RW with bed mobility and transfers. Recommend that pt be in SNF level therapy. Pt would continue to benefit from acute therapy to return him to PLOF. Will continue to follow acutely.    Follow Up Recommendations  SNF;Supervision/Assistance - 24 hour     Equipment Recommendations  None recommended by PT    Recommendations for Other Services       Precautions / Restrictions Precautions Precautions: Fall Precaution Comments: pt has fallen at home several times recently Restrictions Weight Bearing Restrictions: No    Mobility  Bed Mobility Overal bed mobility: Needs Assistance Bed Mobility: Rolling;Sidelying to Sit;Sit to Supine Rolling: Max assist;+2 for physical assistance Sidelying to sit: Max assist   Sit to supine: Min assist   General bed mobility comments: pt required +2 max assist at trunk and LLE to roll to R with railings. Pt  required max assist with sidelying to sit.  Pt was able to go from sit<>supine with verbal cue to hook RLE behind LLE, pt only required min assist at trunk.   Transfers Overall transfer level: Needs assistance Equipment used: Rolling walker (2 wheeled) Transfers: Sit to/from Stand Sit to Stand: Total assist;+2 physical assistance;From elevated surface         General transfer comment: Pt required +2 max assist and RW for sit<>stand transfer. pt required +2 max assist for R  lateral walking with RW to [facility]  Ambulation/Gait             General Gait Details: unable   Stairs             Wheelchair Mobility    Modified Rankin (Stroke Patients Only)       Balance Overall balance assessment: Needs assistance Sitting-balance support: Bilateral upper extremity supported;Feet supported Sitting balance-Leahy Scale: Poor Sitting balance - Comments: LOB x 1, pt required UE support when sitting EOB. pt required min guard assist.  Postural control: Posterior lean Standing balance support: Bilateral upper extremity supported Standing balance-Leahy Scale: Zero Standing balance comment: Pt required RW and total assist for standing balance while bed seats were being changed                             Cognition Arousal/Alertness: Awake/alert Behavior During Therapy: WFL for tasks assessed/performed Overall Cognitive Status: Within Functional Limits for tasks assessed                                        Exercises      General Comments        Pertinent Vitals/Pain Pain Assessment: 0-10 Pain Score: 10-Worst pain ever Pain Location: low back and L hip Pain Descriptors / Indicators: Grimacing;Constant;Aching Pain Intervention(s): Limited activity within patient's tolerance;Monitored during session;Patient requesting pain meds-RN notified  Home Living                      Prior Function            PT Goals (current goals can now be found in the care plan section) Acute Rehab PT Goals Patient Stated Goal: pt would like to go to SNF  PT Goal Formulation: With patient Time For Goal Achievement: 07/30/20 Potential to Achieve Goals: Fair Progress towards PT goals: Progressing toward goals    Frequency    Min 2X/week      PT Plan Current plan remains appropriate    Co-evaluation              AM-PAC PT "6 Clicks" Mobility   Outcome Measure  Help needed turning from your back to your  side while in a flat bed without using bedrails?: A Lot Help needed moving from lying on your back to sitting on the side of a flat bed without using bedrails?: A Lot Help needed moving to and from a bed to a chair (including a wheelchair)?: Total Help needed standing up from a chair using your arms (e.g., wheelchair or bedside chair)?: Total Help needed to walk in hospital room?: Total Help needed climbing 3-5 steps with a railing? : Total 6 Click Score: 8    End of Session Equipment Utilized During Treatment: Gait belt Activity Tolerance: Patient limited by pain Patient left: in bed;with call Aaliah Jorgenson/phone within reach;with bed alarm set Nurse Communication: Mobility status PT Visit Diagnosis: Muscle weakness (generalized) (M62.81);Unsteadiness on feet (R26.81);Pain;Difficulty in walking, not elsewhere classified (R26.2) Pain - Right/Left: Left Pain - part of body: Hip     Time: 1403-1430 PT Time Calculation (min) (ACUTE ONLY): 27 min  Charges:                        Harmon Pier, SPT  Acute Rehabilitation Services  Office: [phone]  07/19/2020, 3:26 PM

## 2020-07-20 LAB — GLUCOSE, CAPILLARY
Glucose-Capillary: 96 mg/dL (ref 70–99)
Glucose-Capillary: 128 mg/dL — ABNORMAL HIGH (ref 70–99)
Glucose-Capillary: 69 mg/dL — ABNORMAL LOW (ref 70–99)

## 2020-07-20 LAB — CULTURE, BLOOD (ROUTINE X 2)
Culture: NO GROWTH
Culture: NO GROWTH

## 2020-07-20 LAB — BASIC METABOLIC PANEL
Anion gap: 8 (ref 5–15)
BUN: 13 mg/dL (ref 8–23)
CO2: 22 mmol/L (ref 22–32)
Calcium: 8.3 mg/dL — ABNORMAL LOW (ref 8.9–10.3)
Chloride: 107 mmol/L (ref 98–111)
Creatinine, Ser: 0.92 mg/dL (ref 0.61–1.24)
GFR calc Af Amer: >60 mL/min (ref >60)
GFR calc non Af Amer: >60 mL/min (ref >60)
Glucose, Bld: 139 mg/dL — ABNORMAL HIGH (ref 70–99)
Potassium: 3.9 mmol/L (ref 3.5–5.1)
Sodium: 137 mmol/L (ref 135–145)

## 2020-07-20 LAB — CBC
HCT: 34.4 % — ABNORMAL LOW (ref 39.0–52.0)
Hemoglobin: 11.3 g/dL — ABNORMAL LOW (ref 13.0–17.0)
MCH: 31 pg (ref 26.0–34.0)
MCHC: 32.8 g/dL (ref 30.0–36.0)
MCV: 94.5 fL (ref 80.0–100.0)
Platelets: 307 10*3/uL (ref 150–400)
RBC: 3.64 MIL/uL — ABNORMAL LOW (ref 4.22–5.81)
RDW: 14.3 % (ref 11.5–15.5)
WBC: 8.5 10*3/uL (ref 4.0–10.5)
nRBC: 0 % (ref 0.0–0.2)

## 2020-07-20 LAB — AEROBIC/ANAEROBIC CULTURE W GRAM STAIN (SURGICAL/DEEP WOUND): Gram Stain: NONE SEEN

## 2020-07-20 MED ORDER — DOXYCYCLINE HYCLATE 100 MG PO TABS: 100.0000 mg | ORAL_TABLET | Freq: Two times a day (BID) | ORAL | 0 refills | Status: AC

## 2020-07-20 MED ORDER — GABAPENTIN 400 MG PO CAPS
400.0000 mg | ORAL_CAPSULE | Freq: Three times a day (TID) | ORAL | Status: DC
  Administered 2020-07-20 (×2): 400 mg via ORAL
  Filled 2020-07-20 (×2): qty 1

## 2020-07-20 MED ORDER — POLYETHYLENE GLYCOL 3350 17 G PO PACK: 17.0000 g | PACK | Freq: Two times a day (BID) | ORAL | 0 refills | Status: DC

## 2020-07-20 MED ORDER — ACETAMINOPHEN 325 MG PO TABS
650.0000 mg | ORAL_TABLET | Freq: Four times a day (QID) | ORAL | 0 refills | Status: DC | PRN
Start: 2020-07-20 — End: 2020-08-06

## 2020-07-20 MED ORDER — GABAPENTIN 400 MG PO CAPS: 400.0000 mg | ORAL_CAPSULE | Freq: Three times a day (TID) | ORAL | 0 refills | Status: DC

## 2020-07-20 NOTE — Plan of Care (Signed)
°  Problem: Health Behavior/Discharge Planning: Goal: Ability to manage health-related needs will improve Outcome: Progressing   Problem: Clinical Measurements: Goal: Will remain free from infection Outcome: Progressing Goal: Diagnostic test results will improve Outcome: Progressing   Problem: Activity: Goal: Risk for activity intolerance will decrease Outcome: Progressing   Problem: Elimination: Goal: Will not experience complications related to bowel motility Outcome: Progressing Goal: Will not experience complications related to urinary retention Outcome: Progressing   Problem: Pain Managment: Goal: General experience of comfort will improve Outcome: Progressing

## 2020-07-20 NOTE — Care Management Important Message (Signed)
Important Message  Patient Details  Name: [name] MRN: [id number] Date of Birth: 10-06-1956   Medicare Important Message Given:  No  Precaution in place, no IM given.   Ameen Mostafa 07/20/2020, 10:55 AM

## 2020-07-20 NOTE — TOC Transition Note (Signed)
Transition of Care [facility]) - CM/SW Discharge Note   Patient Details  Name: [name] MRN: [id number] Date of Birth: 27-May-1956  [facility] of [facility] CM/SW Contact:  Epifanio Lesches, RN Phone Number: 07/20/2020, 9:05 AM   Clinical Narrative:    Patient will DC to: [facility] Anticipated DC date: 07/20/2020 Family notified: Pt states will notify family Transport by: [employer] authorization received from [place], [id number]. Start date 7/29-8/2, authorized for 5 days. CM- Airst Dagoud.  Per MD patient ready for DC today . RN, patient, patient's family, and facility notified of DC. Discharge Summary and FL2 sent to facility. RN to call report prior to discharge [phone]). Rm # [phone]   . DC packet on chart. Ambulance transport requested for patient.   RNCM will sign off for now as intervention is no longer needed. Please consult [place] again if new needs arise.    Final next level of care: Skilled Nursing Facility [facility]) Barriers to Discharge: No Barriers Identified   Patient Goals and CMS Choice Patient states their goals for this hospitalization and ongoing recovery are:: To return back home CMS Medicare.gov Compare Post Acute Care list provided to:: Patient Choice offered to / list presented to : Patient  Discharge Placement                       Discharge Plan and Services                                     Social Determinants of Health (SDOH) Interventions     Readmission Risk Interventions No flowsheet data found.

## 2020-07-20 NOTE — Progress Notes (Signed)
Called [facility] to give report. Unable to get a hold of nurse. Will call again.

## 2020-07-20 NOTE — Progress Notes (Signed)
Subjective:   Hospital day:   Overnight event: No  Patient examined at bedsides. He feels well and is in good spirits.  Patient still complaining of left hip and leg pain but states that pain has improved.  No BM today. Agreeable to discharge to SNF.  Objective:  Vital signs in last 24 hours: Vitals:   07/19/20 0835 07/19/20 1534 07/19/20 2213 07/20/20 0737  BP: (!) 150/83 (!) 141/75 (!) 142/75 127/68  Pulse: 96 100 87 80  Resp: 18 18 20 20   Temp: 97.8 F (36.6 C) 98.4 F (36.9 C) 98.4 F (36.9 C) 97.6 F (36.4 C)  TempSrc: Oral Oral Oral Oral  SpO2: 100% 100% 100% 100%  Weight:      Height:        Physical Exam  Physical Exam Constitutional:      General: He is not in acute distress.    Appearance: He is normal weight. He is not toxic-appearing.  HENT:     Head: Normocephalic.  Eyes:     General: No scleral icterus.    Conjunctiva/sclera: Conjunctivae normal.  Cardiovascular:     Rate and Rhythm: Normal rate and regular rhythm.     Heart sounds: Normal heart sounds. No murmur heard.   Pulmonary:     Effort: Pulmonary effort is normal. No respiratory distress.  Abdominal:     General: Bowel sounds are normal. There is no distension.     Palpations: Abdomen is soft.     Tenderness: There is no abdominal tenderness. There is no guarding.  Musculoskeletal:     Cervical back: Normal range of motion.     Right lower leg: No edema.     Left lower leg: No edema.     Comments: Improved tenderness to palpation of left leg  Skin:    General: Skin is warm.     Coloration: Skin is not jaundiced.     Comments: Wound is cover with bandage  Neurological:     Mental Status: He is alert.  Psychiatric:        Mood and Affect: Mood normal.        Behavior: Behavior normal.        Thought Content: Thought content normal.     Assessment/Plan: [name] is a 64 yo male with PMH of DM, HTN, HLD, and chronic pain syndrome who present to the hospital for chief complain  of hip pain after a fall.Currently treating for Cellulitis of Left hip. PT recommended SNF placement. Patient will need a PCP when he is discharged.Patient is going to a SNF.    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 Patient is doing well, afebrile, and no leukocytosis.  Doxycycline was started yesterday for MSSA pansensitive.   -Patient will continue 8 more days of doxycycline after discharge -Blood cultureshowed no growth on day 4   Left leg pain Chronic pain syndrome His pain is improved compared to yesterday.  His pain is neuropathic in nature.  He needs a follow-up with a pain management physician for better control of the pain. -Pain management: Tylenol 650 mg every 6 as needed, oxycodone 10 mg every 6 as needed, gabapentin 400 mg 3 times a day. -Bowel regimen with MiraLAX -Patient will follow up with pain  management physician for better pain control.   Metabolic acidosis with respiratoryalkalosis ABG shows pH of 7.426, CO2 of 29 and bicarbonate 19. 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 Bicarb improved to 29 after IV fluid yesterday. -Patient will need to follow-up with PCP for further evaluation   Hematuria Repeat UA still shows small hemoglobin.Last renal US did not show any abnormality that explains the hematuria. PSA 1.06. Will continue to monitor. Patient will need to follow up with aPCP and urologist to further evaluate. Hemoglobin  stable   Hypocalcemia Magnesium and phosphorus came back normal. Albumin came back 1.9. Calculated corrected calcium was 9.6 based on low albumin.  Vitamin D was low at 7, so 5 and D3 1000 units was started and continue after discharge.  Patient will need to follow-up with PCP to recheck vitamin D level.   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 Patient lives alone at home. His only family member that lives nearby is his brother. Patient has not seen a PCP for a long time and not taking any medications for his chronic medical conditions. Patient will need TOC and home living situation arranged for his safety.  -SNF placement   DM Patient is not taking Insulin at home for a while. -Hemoglobin A1c 6.9 -Continue Lantus 5 units daily - SSI - CBG monitor   Normocytic anemia  Baseline Hgb around 12-13 on 2013. Current Hgb 10.8. Iron study shows Ferritin of 345, and iron sat of 19%, which consistent with anemia of chronic disease. - Continue to monitorCBC daily   Diet:carb modified IVF:No ELF:YBOFBPZ CODE:Full  Prior to Admission Living Arrangement:home Anticipated Discharge Location:SNF  Barriers to Discharge:None Dispo: Anticipated discharge today  Doran Stabler, DO 07/20/2020, 11:06 AM Pager: [phone] After 5pm on weekdays and 1pm on weekends: On Call pager [phone]     Date: 07/20/2020  Patient name: [name]  Medical record number: [id number]  Date of birth: [birth year]        I have seen and evaluated this patient and I have discussed the plan of care with the house staff. Please see Dr. Weyman Kane note for complete details. I concur with his findings and plan for discharge today.   Inez Catalina, MD 07/20/2020, 10:42 PM

## 2020-07-20 NOTE — Progress Notes (Signed)
BS 69. Patient has good appetite. Consumed all supper. No s/s of hypoglycemia.

## 2020-07-30 ENCOUNTER — Inpatient Hospital Stay (HOSPITAL_COMMUNITY)
Admission: EM | Admit: 2020-07-30 | Discharge: 2020-08-06 | DRG: 602 | Disposition: A | Discharge status: Skilled Nursing Facility | Payer: Medicare Other | Source: Skilled Nursing Facility | Attending: Internal Medicine | Admitting: Internal Medicine

## 2020-07-30 ENCOUNTER — Other Ambulatory Visit: Payer: Self-pay

## 2020-07-30 DIAGNOSIS — L02416 Cutaneous abscess of left lower limb: Principal | ICD-10-CM | POA: Diagnosis present

## 2020-07-30 DIAGNOSIS — G8929 Other chronic pain: Secondary | ICD-10-CM | POA: Diagnosis present

## 2020-07-30 DIAGNOSIS — Z20822 Contact with and (suspected) exposure to covid-19: Secondary | ICD-10-CM | POA: Diagnosis present

## 2020-07-30 DIAGNOSIS — M545 Low back pain: Secondary | ICD-10-CM | POA: Diagnosis present

## 2020-07-30 DIAGNOSIS — E785 Hyperlipidemia, unspecified: Secondary | ICD-10-CM | POA: Diagnosis present

## 2020-07-30 DIAGNOSIS — K5900 Constipation, unspecified: Secondary | ICD-10-CM | POA: Diagnosis present

## 2020-07-30 DIAGNOSIS — Z23 Encounter for immunization: Secondary | ICD-10-CM

## 2020-07-30 DIAGNOSIS — R05 Cough: Secondary | ICD-10-CM

## 2020-07-30 DIAGNOSIS — M25559 Pain in unspecified hip: Secondary | ICD-10-CM

## 2020-07-30 DIAGNOSIS — E162 Hypoglycemia, unspecified: Secondary | ICD-10-CM

## 2020-07-30 DIAGNOSIS — G894 Chronic pain syndrome: Secondary | ICD-10-CM | POA: Diagnosis present

## 2020-07-30 DIAGNOSIS — D649 Anemia, unspecified: Secondary | ICD-10-CM | POA: Diagnosis present

## 2020-07-30 DIAGNOSIS — J189 Pneumonia, unspecified organism: Secondary | ICD-10-CM | POA: Diagnosis present

## 2020-07-30 DIAGNOSIS — Z888 Allergy status to other drugs, medicaments and biological substances status: Secondary | ICD-10-CM

## 2020-07-30 DIAGNOSIS — M609 Myositis, unspecified: Secondary | ICD-10-CM | POA: Diagnosis present

## 2020-07-30 DIAGNOSIS — F17210 Nicotine dependence, cigarettes, uncomplicated: Secondary | ICD-10-CM | POA: Diagnosis present

## 2020-07-30 DIAGNOSIS — L0291 Cutaneous abscess, unspecified: Secondary | ICD-10-CM

## 2020-07-30 DIAGNOSIS — I10 Essential (primary) hypertension: Secondary | ICD-10-CM | POA: Diagnosis present

## 2020-07-30 DIAGNOSIS — Z66 Do not resuscitate: Secondary | ICD-10-CM | POA: Diagnosis present

## 2020-07-30 DIAGNOSIS — L03116 Cellulitis of left lower limb: Secondary | ICD-10-CM | POA: Diagnosis present

## 2020-07-30 DIAGNOSIS — Z833 Family history of diabetes mellitus: Secondary | ICD-10-CM

## 2020-07-30 DIAGNOSIS — R0602 Shortness of breath: Secondary | ICD-10-CM | POA: Diagnosis not present

## 2020-07-30 DIAGNOSIS — R059 Cough, unspecified: Secondary | ICD-10-CM

## 2020-07-30 DIAGNOSIS — E11649 Type 2 diabetes mellitus with hypoglycemia without coma: Secondary | ICD-10-CM | POA: Diagnosis present

## 2020-07-30 DIAGNOSIS — Z79899 Other long term (current) drug therapy: Secondary | ICD-10-CM

## 2020-07-30 LAB — CBG MONITORING, ED: Glucose-Capillary: 95 mg/dL (ref 70–99)

## 2020-07-30 NOTE — ED Triage Notes (Signed)
Pt arrived via ems due to altered mental status. Pt is from [facility] center where they found him unresponsive. Pt initial cbg was 39 pt given 1 glucagon at facility. Given 25g of D10 with ems last cbg was 156. Pt A&O x 4

## 2020-07-30 NOTE — ED Provider Notes (Signed)
[facility] EMERGENCY DEPARTMENT Provider Note   CSN: [id number] Arrival date & time: 07/30/20  2347     History Chief Complaint  Patient presents with  . Altered Mental Status    CASHMERE HARMES is a 64 y.o. male with history of HTN, HLD who presents to the emergency department by EMS from [facility] for altered mental status.  Robin, the patient's nurse, reports that the patient was requesting assistance with ambulation and was assisted by one of the CNA's.  She reports that she was initially alert with a GCS of 15 and the patient was able to stand.  The CNA noted that the patient was diaphoretic and his entire bed states were soaked.  The CNA left the room, and when his nurse returned, he "seemed out of it" and was only responsive to painful stimuli.  CBG was obtained, which was 49.  She attempted to give him some milk, but it seemed as if he had a difficult time swallowing it.  He was then given IM glucagon, but when his CBG was rechecked it was 39, which was around the time the EMS arrived.  He was given 25 g of D10 with EMS and last CBG was 156.  Patient was noted to be alert and oriented x4 with EMS in route.  In the ER, the patient is complaining of pain in his bilateral hips and and has right foot. Stated to RN that "he had a hole in his right foot".  He has no other complaints at this time including chest pain, shortness of breath, abdominal pain, nausea, vomiting, diarrhea, fever, chills, rash.  [facility] staff reports that the patient typically is not pleasant towards staff, but is very talkative.  She states that the patient has mentioned multiple times to staff members over the last few days that he needs his "diabetes medications", but staff does not have any antihyperglycemic medications listed on his requisition.  Staff spoke with family here earlier in the day when they called and asked if he had been receiving his home DM  meds, but did not see a diagnosis of diabetes mellitus on his chart.  No recent falls.  The patient was recently hospitalized from 7/25-7/30 for purulent cellulitis of the left hip, hematuria, and metabolic acidosis.  He was previously living at home.  A culture of the wound on the left hip grew pansensitive MSSA and he was treated with vancomycin and then deescalated to doxycycline for 14 days. Per facility records, the patient was started on IM Rocephin on 8/8 for left hip wound infection.  He is oriented to first and last name, city and state, month, but believes the year to be 2001.  Level 5 caveat secondary to altered mental status.   The history is provided by the patient. No language interpreter was used.       Past Medical History:  Diagnosis Date  . Cellulitis 07/17/2020   LEFT HIP  . Diabetes mellitus   . Hyperlipidemia   . Hypertension     Patient Active Problem List   Diagnosis Date Noted  . Hypoglycemia 07/31/2020  . Purulent Cellulitis of left hip 07/15/2020  . Recurrent falls 07/15/2020  . Chronic pain 07/15/2020  . 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 Physical deconditioning 07/15/2020  . Asymptomatic bacteriuria 07/15/2020  . Diabetes mellitus (HCC) 01/28/2012    Past Surgical History:  Procedure Laterality Date  . SPINE SURGERY  No family history on file.  Social History   Tobacco Use  . Smoking status: Current Every Day Smoker    Packs/day: 0.50    Years: 40.00    Pack years: 20.00    Types: Cigarettes  . Smokeless tobacco: Never Used  Vaping Use  . Vaping Use: Never used  Substance Use Topics  . Alcohol use: No  . Drug use: No    Home Medications Prior to Admission medications   Medication Sig Start Date End Date Taking? Authorizing Provider  acetaminophen (TYLENOL) 325 MG tablet Take 2 tablets (650 mg total) by mouth every 6 (six) hours as needed. 07/20/20 08/19/20 Yes Doran Stabler, DO  Amino Acids-Protein  Hydrolys (FEEDING SUPPLEMENT, PRO-STAT SUGAR FREE 64,) LIQD Take 30 mLs by mouth 3 (three) times daily with meals.   Yes [provider]  baclofen (LIORESAL) 5 mg TABS tablet Take 5 mg by mouth 2 (two) times daily.   Yes [provider]  cholecalciferol (VITAMIN D) 25 MCG tablet Take 1 tablet (1,000 Units total) by mouth daily. 07/20/20 08/19/20 Yes Doran Stabler, DO  gabapentin (NEURONTIN) 400 MG capsule Take 1 capsule (400 mg total) by mouth 3 (three) times daily. 07/20/20 08/19/20 Yes Doran Stabler, DO  ibuprofen (ADVIL) 200 MG tablet Take 400 mg by mouth once.   Yes [provider]  oxyCODONE (OXY IR/ROXICODONE) 5 MG immediate release tablet Take 2 tablets (10 mg total) by mouth every 6 (six) hours as needed for moderate pain or severe pain. 07/19/20 08/18/20 Yes Doran Stabler, DO  polyethylene glycol (MIRALAX / GLYCOLAX) 17 g packet Take 17 g by mouth 2 (two) times daily for 14 days. 07/20/20 08/03/20 Yes Doran Stabler, DO    Allergies    Lipitor [atorvastatin calcium]  Review of Systems   Review of Systems  Unable to perform ROS: Mental status change    Physical Exam Updated Vital Signs BP 127/75   Pulse 65   Temp (!) 96 F (35.6 C) (Rectal)   Resp 15   Ht 5\' 10"  (1.778 m)   Wt 99.8 kg   SpO2 98%   BMI 31.57 kg/m   Physical Exam Vitals and nursing note reviewed.  Constitutional:      Appearance: He is well-developed. He is not toxic-appearing or diaphoretic.     Comments: No diaphoresis  HENT:     Head: Normocephalic and atraumatic.  Eyes:     Extraocular Movements: Extraocular movements intact.     Conjunctiva/sclera: Conjunctivae normal.     Pupils: Pupils are equal, round, and reactive to light.  Cardiovascular:     Rate and Rhythm: Normal rate and regular rhythm.     Pulses: Normal pulses.     Heart sounds: Normal heart sounds. No murmur heard.  No friction rub. No gallop.   Pulmonary:     Effort: Pulmonary effort is normal. No respiratory  distress.     Breath sounds: No stridor. No wheezing, rhonchi or rales.  Chest:     Chest wall: No tenderness.  Abdominal:     General: There is no distension.     Palpations: Abdomen is soft. There is no mass.     Tenderness: There is no abdominal tenderness. There is no right CVA tenderness, left CVA tenderness, guarding or rebound.     Hernia: No hernia is present.     Comments: Abdomen is soft, nontender, nondistended.  Musculoskeletal:     Cervical back: Neck supple.     Comments: Tender  to palpation to the left hip diffusely.  Left knee is nontender.  Increased pain in the left hip with range of motion of the left knee.  He is diffusely tender to palpation to the right hip and ankle.  There is no edema or erythema noted to the bilateral lower extremities.  Neurovascularly intact.  Skin:    General: Skin is warm and dry.     Coloration: Skin is not jaundiced or pale.     Comments: No cyanosis or mottling.  Neurological:     Mental Status: He is alert.     Comments: GCS 15. Oriented to first and last name, month, city, and state. Believes the year to be 2001.  Follows simple commands.  Is able to speak in complete, fluent sentences without slurred speech.  Moves all 4 extremities spontaneously.   Psychiatric:        Behavior: Behavior normal.     ED Results / Procedures / Treatments   Labs (all labs ordered are listed, but only abnormal results are displayed) Labs Reviewed  CBC WITH DIFFERENTIAL/PLATELET - Abnormal; Notable for the following components:      Result Value   WBC 11.0 (*)    RBC 3.21 (*)    Hemoglobin 9.9 (*)    HCT 30.6 (*)    Platelets 410 (*)    Neutro Abs 9.0 (*)    Abs Immature Granulocytes 0.13 (*)    All other components within normal limits  COMPREHENSIVE METABOLIC PANEL - Abnormal; Notable for the following components:   Sodium 134 (*)    Glucose, Bld 108 (*)    Calcium 8.4 (*)    Total Protein 6.0 (*)    Albumin 2.1 (*)    All other components  within normal limits  COMPREHENSIVE METABOLIC PANEL - Abnormal; Notable for the following components:   Calcium 8.5 (*)    Total Protein 6.4 (*)    Albumin 2.2 (*)    All other components within normal limits  TSH - Abnormal; Notable for the following components:   TSH 5.897 (*)    All other components within normal limits  CBG MONITORING, ED - Abnormal; Notable for the following components:   Glucose-Capillary 59 (*)    All other components within normal limits  CBG MONITORING, ED - Abnormal; Notable for the following components:   Glucose-Capillary 149 (*)    All other components within normal limits  CBG MONITORING, ED - Abnormal; Notable for the following components:   Glucose-Capillary 108 (*)    All other components within normal limits  SARS CORONAVIRUS 2 BY RT PCR (HOSPITAL ORDER, PERFORMED IN [facility])  CULTURE, BLOOD (ROUTINE X 2)  CULTURE, BLOOD (ROUTINE X 2)  MRSA PCR SCREENING  LACTIC ACID, PLASMA  MAGNESIUM  BETA-HYDROXYBUTYRIC ACID  URINALYSIS, ROUTINE W REFLEX MICROSCOPIC  C-PEPTIDE  PROINSULIN/INSULIN RATIO  CORTISOL-AM, BLOOD  T4, FREE  CBG MONITORING, ED  CBG MONITORING, ED  CBG MONITORING, ED    EKG EKG Interpretation  Date/Time:  Monday July 30 2020 23:54:49 EDT Ventricular Rate:  80 PR Interval:    QRS Duration: 94 QT Interval:  398 QTC Calculation: 460 R Axis:   84 Text Interpretation: Sinus rhythm Anteroseptal infarct, age indeterminate Lateral leads are also involved Confirmed by Gilda CreasePollina, Christopher J [phone]([id number]) on 07/30/2020 11:55:40 PM   Radiology DG Chest 1 View  Result Date: 07/31/2020 CLINICAL DATA:  Altered mental status.  Found unresponsive. EXAM: CHEST  1 VIEW COMPARISON:  One-view  chest x-ray 08/08/2008. FINDINGS: Heart is enlarged. Mild pulmonary vascular congestion is present. Left greater than right lower lobe airspace opacities are present. Axial skeleton is unremarkable. IMPRESSION: 1. Cardiomegaly and mild  pulmonary vascular congestion. 2. Left greater than right lower lobe airspace disease, concerning for pneumonia. Electronically Signed   By: Marin Roberts M.D.   On: 07/31/2020 04:34    Procedures .Critical Care Performed by: Barkley Boards, PA-C Authorized by: Barkley Boards, PA-C   Critical care provider statement:    Critical care time (minutes):  50   Critical care time was exclusive of:  Separately billable procedures and treating other patients and teaching time   Critical care was necessary to treat or prevent imminent or life-threatening deterioration of the following conditions:  Metabolic crisis (hypoglycemia)   Critical care was time spent personally by me on the following activities:  Obtaining history from patient or surrogate, examination of patient, evaluation of patient's response to treatment, review of old charts, ordering and performing treatments and interventions, ordering and review of laboratory studies, ordering and review of radiographic studies, pulse oximetry and development of treatment plan with patient or surrogate   I assumed direction of critical care for this patient from another provider in my specialty: no     (including critical care time)  Medications Ordered in ED Medications  sodium chloride flush (NS) 0.9 % injection 3 mL (3 mLs Intravenous Given 07/31/20 0150)  sodium chloride flush (NS) 0.9 % injection 3 mL (has no administration in time range)  0.9 %  sodium chloride infusion (has no administration in time range)  enoxaparin (LOVENOX) injection 40 mg (has no administration in time range)  acetaminophen (TYLENOL) tablet 650 mg (650 mg Oral Given 07/31/20 0433)    Or  acetaminophen (TYLENOL) suppository 650 mg ( Rectal See Alternative 07/31/20 0433)  dextrose 10 % 1,000 mL with sodium chloride 0.45 %, potassium chloride 40 mEq/L infusion ( Intravenous New Bag/Given 07/31/20 0428)  azithromycin (ZITHROMAX) tablet 500 mg (500 mg Oral Given  07/31/20 0516)    Followed by  azithromycin (ZITHROMAX) tablet 250 mg (has no administration in time range)  oxyCODONE (Oxy IR/ROXICODONE) immediate release tablet 5 mg (has no administration in time range)  cefTRIAXone (ROCEPHIN) 1 g in sodium chloride 0.9 % 100 mL IVPB (1 g Intravenous New Bag/Given 07/31/20 0620)  polyethylene glycol (MIRALAX / GLYCOLAX) packet 17 g (has no administration in time range)  dextrose 50 % solution 50 mL (50 mLs Intravenous Given 07/31/20 0148)    ED Course  I have reviewed the triage vital signs and the nursing notes.  Pertinent labs & imaging results that were available during my care of the patient were reviewed by me and considered in my medical decision making (see chart for details).    MDM Rules/Calculators/A&P                          64 year old male with history of HTN, HLD brought in by EMS from nursing facility for altered mental status.  Patient was found to be hypoglycemic at 49 and was given glucagon at facility prior to EMS arrival.  However, repeat CBG on EMS arrival with a glucose of 39 and he was given 25 g of D10.  Last CBG was 156 in route.  He was noted to be alert and oriented x4 while being transported.  The patient was discussed and evaluated by Dr. Glee Arvin, attending physician who is in  agreement with the work-up and plan.  Alert and oriented x3 in the ER.  He is not oriented to year.  He has a history of diabetes mellitus, but does not appear to be taking any antihyperglycemic medication based on the paperwork from his nursing facility.  It does appear that during his last admission that he was taking Lantus and Humulin, but these appear to have been discontinued prior to discharge.  His A1c during that admission was 6.9. Initial CBG was 95, but recheck was 59.  He was given an amp of D50. He will require admission for hypoglycemia of unknown etiology. Spoke with the internal medicine teaching service and Dr. Cleaster Corin has accepted the  patient for admission.   The patient appears reasonably stabilized for admission considering the current resources, flow, and capabilities available in the ED at this time, and I doubt any other [facility] requiring further screening and/or treatment in the ED prior to admission.   Final Clinical Impression(s) / ED Diagnoses Final diagnoses:  Hypoglycemia  Shortness of breath    Rx / DC Orders ED Discharge Orders    None       Bradyn Vassey A, PA-C 07/31/20 0630    Gilda Crease, MD 08/01/20 [phone]

## 2020-07-31 ENCOUNTER — Observation Stay (HOSPITAL_COMMUNITY): Payer: Medicare Other

## 2020-07-31 ENCOUNTER — Encounter (HOSPITAL_COMMUNITY): Payer: Self-pay | Admitting: Internal Medicine

## 2020-07-31 DIAGNOSIS — E162 Hypoglycemia, unspecified: Secondary | ICD-10-CM

## 2020-07-31 DIAGNOSIS — D649 Anemia, unspecified: Secondary | ICD-10-CM | POA: Diagnosis present

## 2020-07-31 DIAGNOSIS — L03116 Cellulitis of left lower limb: Secondary | ICD-10-CM | POA: Diagnosis present

## 2020-07-31 DIAGNOSIS — M609 Myositis, unspecified: Secondary | ICD-10-CM | POA: Diagnosis present

## 2020-07-31 DIAGNOSIS — R0602 Shortness of breath: Secondary | ICD-10-CM | POA: Diagnosis present

## 2020-07-31 DIAGNOSIS — Z79899 Other long term (current) drug therapy: Secondary | ICD-10-CM | POA: Diagnosis not present

## 2020-07-31 DIAGNOSIS — G894 Chronic pain syndrome: Secondary | ICD-10-CM | POA: Diagnosis present

## 2020-07-31 DIAGNOSIS — E119 Type 2 diabetes mellitus without complications: Secondary | ICD-10-CM

## 2020-07-31 DIAGNOSIS — J189 Pneumonia, unspecified organism: Secondary | ICD-10-CM

## 2020-07-31 DIAGNOSIS — I10 Essential (primary) hypertension: Secondary | ICD-10-CM | POA: Diagnosis present

## 2020-07-31 DIAGNOSIS — Z23 Encounter for immunization: Secondary | ICD-10-CM | POA: Diagnosis present

## 2020-07-31 DIAGNOSIS — F17210 Nicotine dependence, cigarettes, uncomplicated: Secondary | ICD-10-CM | POA: Diagnosis present

## 2020-07-31 DIAGNOSIS — K5900 Constipation, unspecified: Secondary | ICD-10-CM | POA: Diagnosis present

## 2020-07-31 DIAGNOSIS — Z66 Do not resuscitate: Secondary | ICD-10-CM | POA: Diagnosis present

## 2020-07-31 DIAGNOSIS — Z833 Family history of diabetes mellitus: Secondary | ICD-10-CM | POA: Diagnosis not present

## 2020-07-31 DIAGNOSIS — Z20822 Contact with and (suspected) exposure to covid-19: Secondary | ICD-10-CM | POA: Diagnosis present

## 2020-07-31 DIAGNOSIS — E785 Hyperlipidemia, unspecified: Secondary | ICD-10-CM | POA: Diagnosis present

## 2020-07-31 DIAGNOSIS — L02416 Cutaneous abscess of left lower limb: Secondary | ICD-10-CM | POA: Diagnosis present

## 2020-07-31 DIAGNOSIS — Z888 Allergy status to other drugs, medicaments and biological substances status: Secondary | ICD-10-CM | POA: Diagnosis not present

## 2020-07-31 DIAGNOSIS — E11649 Type 2 diabetes mellitus with hypoglycemia without coma: Secondary | ICD-10-CM | POA: Diagnosis present

## 2020-07-31 DIAGNOSIS — M545 Low back pain: Secondary | ICD-10-CM | POA: Diagnosis present

## 2020-07-31 LAB — CBG MONITORING, ED
Glucose-Capillary: 149 mg/dL — ABNORMAL HIGH (ref 70–99)
Glucose-Capillary: 59 mg/dL — ABNORMAL LOW (ref 70–99)
Glucose-Capillary: 99 mg/dL (ref 70–99)
Glucose-Capillary: 86 mg/dL (ref 70–99)
Glucose-Capillary: 108 mg/dL — ABNORMAL HIGH (ref 70–99)
Glucose-Capillary: 110 mg/dL — ABNORMAL HIGH (ref 70–99)
Glucose-Capillary: 113 mg/dL — ABNORMAL HIGH (ref 70–99)
Glucose-Capillary: 119 mg/dL — ABNORMAL HIGH (ref 70–99)
Glucose-Capillary: 127 mg/dL — ABNORMAL HIGH (ref 70–99)
Glucose-Capillary: 166 mg/dL — ABNORMAL HIGH (ref 70–99)

## 2020-07-31 LAB — CBC WITH DIFFERENTIAL/PLATELET
Abs Immature Granulocytes: 0.11 10*3/uL — ABNORMAL HIGH (ref 0.00–0.07)
Abs Immature Granulocytes: 0.13 10*3/uL — ABNORMAL HIGH (ref 0.00–0.07)
Basophils Absolute: 0.1 10*3/uL (ref 0.0–0.1)
Basophils Absolute: 0.1 10*3/uL (ref 0.0–0.1)
Basophils Relative: 0 %
Basophils Relative: 1 %
Eosinophils Absolute: 0 10*3/uL (ref 0.0–0.5)
Eosinophils Absolute: 0 10*3/uL (ref 0.0–0.5)
Eosinophils Relative: 0 %
Eosinophils Relative: 0 %
HCT: 30.6 % — ABNORMAL LOW (ref 39.0–52.0)
HCT: 31.4 % — ABNORMAL LOW (ref 39.0–52.0)
Hemoglobin: 9.8 g/dL — ABNORMAL LOW (ref 13.0–17.0)
Hemoglobin: 9.9 g/dL — ABNORMAL LOW (ref 13.0–17.0)
Immature Granulocytes: 1 %
Immature Granulocytes: 1 %
Lymphocytes Relative: 11 %
Lymphocytes Relative: 9 %
Lymphs Abs: 1 10*3/uL (ref 0.7–4.0)
Lymphs Abs: 1.7 10*3/uL (ref 0.7–4.0)
MCH: 30.4 pg (ref 26.0–34.0)
MCH: 30.8 pg (ref 26.0–34.0)
MCHC: 31.2 g/dL (ref 30.0–36.0)
MCHC: 32.4 g/dL (ref 30.0–36.0)
MCV: 95.3 fL (ref 80.0–100.0)
MCV: 97.5 fL (ref 80.0–100.0)
Monocytes Absolute: 0.8 10*3/uL (ref 0.1–1.0)
Monocytes Absolute: 1.1 10*3/uL — ABNORMAL HIGH (ref 0.1–1.0)
Monocytes Relative: 7 %
Monocytes Relative: 7 %
Neutro Abs: 12.5 10*3/uL — ABNORMAL HIGH (ref 1.7–7.7)
Neutro Abs: 9 10*3/uL — ABNORMAL HIGH (ref 1.7–7.7)
Neutrophils Relative %: 81 %
Neutrophils Relative %: 82 %
Platelets: 389 10*3/uL (ref 150–400)
Platelets: 410 10*3/uL — ABNORMAL HIGH (ref 150–400)
RBC: 3.21 MIL/uL — ABNORMAL LOW (ref 4.22–5.81)
RBC: 3.22 MIL/uL — ABNORMAL LOW (ref 4.22–5.81)
RDW: 14.6 % (ref 11.5–15.5)
RDW: 14.6 % (ref 11.5–15.5)
WBC: 11 10*3/uL — ABNORMAL HIGH (ref 4.0–10.5)
WBC: 15.5 10*3/uL — ABNORMAL HIGH (ref 4.0–10.5)
nRBC: 0 % (ref 0.0–0.2)
nRBC: 0 % (ref 0.0–0.2)

## 2020-07-31 LAB — URINALYSIS, ROUTINE W REFLEX MICROSCOPIC
Bilirubin Urine: NEGATIVE
Glucose, UA: NEGATIVE mg/dL
Hgb urine dipstick: NEGATIVE
Ketones, ur: NEGATIVE mg/dL
Leukocytes,Ua: NEGATIVE
Nitrite: NEGATIVE
Protein, ur: NEGATIVE mg/dL
Specific Gravity, Urine: 1.011 (ref 1.005–1.030)
pH: 5 (ref 5.0–8.0)

## 2020-07-31 LAB — COMPREHENSIVE METABOLIC PANEL
ALT: 18 U/L (ref 0–44)
ALT: 19 U/L (ref 0–44)
AST: 19 U/L (ref 15–41)
AST: 23 U/L (ref 15–41)
Albumin: 2.1 g/dL — ABNORMAL LOW (ref 3.5–5.0)
Albumin: 2.2 g/dL — ABNORMAL LOW (ref 3.5–5.0)
Alkaline Phosphatase: 78 U/L (ref 38–126)
Alkaline Phosphatase: 82 U/L (ref 38–126)
Anion gap: 11 (ref 5–15)
Anion gap: 12 (ref 5–15)
BUN: 21 mg/dL (ref 8–23)
BUN: 21 mg/dL (ref 8–23)
CO2: 24 mmol/L (ref 22–32)
CO2: 24 mmol/L (ref 22–32)
Calcium: 8.4 mg/dL — ABNORMAL LOW (ref 8.9–10.3)
Calcium: 8.5 mg/dL — ABNORMAL LOW (ref 8.9–10.3)
Chloride: 99 mmol/L (ref 98–111)
Chloride: 100 mmol/L (ref 98–111)
Creatinine, Ser: 0.98 mg/dL (ref 0.61–1.24)
Creatinine, Ser: 0.92 mg/dL (ref 0.61–1.24)
GFR calc Af Amer: >60 mL/min (ref >60)
GFR calc Af Amer: >60 mL/min (ref >60)
GFR calc non Af Amer: >60 mL/min (ref >60)
GFR calc non Af Amer: >60 mL/min (ref >60)
Glucose, Bld: 108 mg/dL — ABNORMAL HIGH (ref 70–99)
Glucose, Bld: 86 mg/dL (ref 70–99)
Potassium: 3.6 mmol/L (ref 3.5–5.1)
Potassium: 3.9 mmol/L (ref 3.5–5.1)
Sodium: 134 mmol/L — ABNORMAL LOW (ref 135–145)
Sodium: 136 mmol/L (ref 135–145)
Total Bilirubin: 0.5 mg/dL (ref 0.3–1.2)
Total Bilirubin: 0.6 mg/dL (ref 0.3–1.2)
Total Protein: 6 g/dL — ABNORMAL LOW (ref 6.5–8.1)
Total Protein: 6.4 g/dL — ABNORMAL LOW (ref 6.5–8.1)

## 2020-07-31 LAB — T4, FREE: Free T4: 1.48 ng/dL — ABNORMAL HIGH (ref 0.61–1.12)

## 2020-07-31 LAB — GLUCOSE, CAPILLARY
Glucose-Capillary: 166 mg/dL — ABNORMAL HIGH (ref 70–99)
Glucose-Capillary: 240 mg/dL — ABNORMAL HIGH (ref 70–99)

## 2020-07-31 LAB — CORTISOL-AM, BLOOD: Cortisol - AM: 12.7 ug/dL (ref 6.7–22.6)

## 2020-07-31 LAB — MRSA PCR SCREENING: MRSA by PCR: NEGATIVE

## 2020-07-31 LAB — SARS CORONAVIRUS 2 BY RT PCR (HOSPITAL ORDER, PERFORMED IN ~~LOC~~ HOSPITAL LAB): SARS Coronavirus 2: NEGATIVE

## 2020-07-31 LAB — LACTIC ACID, PLASMA: Lactic Acid, Venous: 1.5 mmol/L (ref 0.5–1.9)

## 2020-07-31 LAB — MAGNESIUM: Magnesium: 1.9 mg/dL (ref 1.7–2.4)

## 2020-07-31 LAB — TSH: TSH: 5.897 u[IU]/mL — ABNORMAL HIGH (ref 0.350–4.500)

## 2020-07-31 LAB — BETA-HYDROXYBUTYRIC ACID: Beta-Hydroxybutyric Acid: 0.18 mmol/L (ref 0.05–0.27)

## 2020-07-31 LAB — PROCALCITONIN: Procalcitonin: 1.78 ng/mL

## 2020-07-31 MED ORDER — ENOXAPARIN SODIUM 40 MG/0.4ML ~~LOC~~ SOLN
40.0000 mg | SUBCUTANEOUS | Status: DC
  Administered 2020-07-31 – 2020-08-06 (×6): 40 mg via SUBCUTANEOUS
  Filled 2020-07-31 (×6): qty 0.4

## 2020-07-31 MED ORDER — SODIUM CHLORIDE 0.9% FLUSH: 3.0000 mL | INTRAVENOUS | Status: DC | PRN

## 2020-07-31 MED ORDER — SODIUM CHLORIDE 0.9 % IV SOLN: 250.0000 mL | INTRAVENOUS | Status: DC | PRN

## 2020-07-31 MED ORDER — ACETAMINOPHEN 325 MG PO TABS
650.0000 mg | ORAL_TABLET | Freq: Four times a day (QID) | ORAL | Status: DC | PRN
  Administered 2020-07-31 – 2020-08-02 (×2): 650 mg via ORAL
  Filled 2020-07-31 (×2): qty 2

## 2020-07-31 MED ORDER — POLYETHYLENE GLYCOL 3350 17 G PO PACK: 17.0000 g | PACK | Freq: Every day | ORAL | Status: DC | PRN

## 2020-07-31 MED ORDER — AZITHROMYCIN 250 MG PO TABS
250.0000 mg | ORAL_TABLET | Freq: Every day | ORAL | Status: DC
  Administered 2020-08-01 – 2020-08-02 (×2): 250 mg via ORAL
  Filled 2020-07-31 (×2): qty 1

## 2020-07-31 MED ORDER — AZITHROMYCIN 250 MG PO TABS
500.0000 mg | ORAL_TABLET | Freq: Every day | ORAL | Status: AC
  Administered 2020-07-31: 500 mg via ORAL
  Filled 2020-07-31: qty 2

## 2020-07-31 MED ORDER — DEXTROSE 50 % IV SOLN
1.0000 | Freq: Once | INTRAVENOUS | Status: AC
  Administered 2020-07-31: 50 mL via INTRAVENOUS
  Filled 2020-07-31: qty 50

## 2020-07-31 MED ORDER — ACETAMINOPHEN 650 MG RE SUPP: 650.0000 mg | Freq: Four times a day (QID) | RECTAL | Status: DC | PRN

## 2020-07-31 MED ORDER — OXYCODONE HCL 5 MG PO TABS
5.0000 mg | ORAL_TABLET | Freq: Four times a day (QID) | ORAL | Status: DC | PRN
  Administered 2020-07-31 – 2020-08-06 (×14): 5 mg via ORAL
  Filled 2020-07-31 (×14): qty 1

## 2020-07-31 MED ORDER — PNEUMOCOCCAL VAC POLYVALENT 25 MCG/0.5ML IJ INJ
0.5000 mL | INJECTION | INTRAMUSCULAR | Status: AC
  Administered 2020-08-01: 0.5 mL via INTRAMUSCULAR
  Filled 2020-07-31: qty 0.5

## 2020-07-31 MED ORDER — DOXYCYCLINE HYCLATE 100 MG PO TABS
100.0000 mg | ORAL_TABLET | Freq: Two times a day (BID) | ORAL | Status: DC
  Administered 2020-07-31: 100 mg via ORAL
  Filled 2020-07-31: qty 1

## 2020-07-31 MED ORDER — SODIUM CHLORIDE 0.9% FLUSH
3.0000 mL | Freq: Two times a day (BID) | INTRAVENOUS | Status: DC
  Administered 2020-07-31 – 2020-08-04 (×8): 3 mL via INTRAVENOUS

## 2020-07-31 MED ORDER — SODIUM CHLORIDE 0.9 % IV SOLN
1.0000 g | INTRAVENOUS | Status: DC
  Administered 2020-07-31 – 2020-08-04 (×5): 1 g via INTRAVENOUS
  Filled 2020-07-31 (×5): qty 10

## 2020-07-31 MED ORDER — ACETAMINOPHEN 500 MG PO TABS
1000.0000 mg | ORAL_TABLET | Freq: Three times a day (TID) | ORAL | Status: DC
  Administered 2020-07-31 – 2020-08-06 (×17): 1000 mg via ORAL
  Filled 2020-07-31 (×21): qty 2

## 2020-07-31 MED ORDER — SODIUM CHLORIDE 4 MEQ/ML IV SOLN
INTRAVENOUS | Status: DC
  Filled 2020-07-31: qty 1000
  Filled 2020-07-31: qty 0
  Filled 2020-07-31: qty 1000

## 2020-07-31 MED FILL — Dextrose 10% w/ Sodium Chloride 0.45%: INTRAVENOUS | Qty: 1000 | Status: AC

## 2020-07-31 MED FILL — Potassium Chloride Inj 2 mEq/ML: INTRAVENOUS | Qty: 20 | Status: AC

## 2020-07-31 NOTE — H&P (Addendum)
Date: 07/31/2020               Patient Name:  [name] MRN: [id number]  DOB: 1956-05-05 Age / Sex: 64 y.o., male   PCP: Patient, No Pcp Per         Medical Service: Internal Medicine Teaching Service         Attending Physician: Dr. Mayford Knife, Dorene Ar, MD    First Contact: Dr. Kirke Corin Pager: [phone]  Second Contact: Dr. Chesley Mires  Pager: [phone]       After Hours (After 5p/  First Contact Pager: [phone]  weekends / holidays): Second Contact Pager: [phone]   Chief Complaint: Confusion  History of Present Illness:  [name] is a 64 y.o. M w/ PMHx DM, HTN, and HLD who presented to the ED by EMS from [facility] for AMS in the setting of hypoglycemia. Per nursing staff and ED report,  the patient was requesting assistance with ambulation and was assisted by one of the CNA's. She reports that she was initially alert with a GCS of 15; however, his nurse states that when she went to check on him this evening, he was sweating profusely, semi-responsive, with waxing and waning lethargy and sometimes only responsive to painful stimuli. CBG at that time was 49. He was given glucagon, but when his sugar was checked again, it was 39. She states that he had been eating well today, with his last meal at 5:00pm. She notes he last snacked on chips and cookies at 7:00pm.   When speaking with the patient, he endorses feeling confused and tired. He endorses intermittent fevers and feeling cold and states that he has had a cough with shortness of breath for two days. He does note burning foot pain in his right foot, but states this improved after his foot was shifted in the bed. He also notes pain in his left hip, but denies any other pain. He was admitted recently at [facility] from 07/15/20-07/20/20 for purulent cellulitis of the left hip and states that he has taken his doxycycline at his facility regularly. His nursing staff reports that he takes his other  medications listed below as prescribed; however, they are unaware that the patient has a history of diabetes and state he is not on any glucose-lowering medications. He says he was told on his previous admission that he had hematuria, but denies any dysuria or frequency. He does endorse diarrhea on Miralax but denies constipation.  Current Meds  Medication Sig  . acetaminophen (TYLENOL) 325 MG tablet Take 2 tablets (650 mg total) by mouth every 6 (six) hours as needed.  . Amino Acids-Protein Hydrolys (FEEDING SUPPLEMENT, PRO-STAT SUGAR FREE 64,) LIQD Take 30 mLs by mouth 3 (three) times daily with meals.  . baclofen (LIORESAL) 5 mg TABS tablet Take 5 mg by mouth 2 (two) times daily.  . cholecalciferol (VITAMIN D) 25 MCG tablet Take 1 tablet (1,000 Units total) by mouth daily.  Marland Kitchen gabapentin (NEURONTIN) 400 MG capsule Take 1 capsule (400 mg total) by mouth 3 (three) times daily.  Marland Kitchen ibuprofen (ADVIL) 200 MG tablet Take 400 mg by mouth once.  Marland Kitchen oxyCODONE (OXY IR/ROXICODONE) 5 MG immediate release tablet Take 2 tablets (10 mg total) by mouth every 6 (six) hours as needed for moderate pain or severe pain.  . polyethylene glycol (MIRALAX / GLYCOLAX) 17 g packet Take 17 g by mouth 2 (two) times daily for 14 days.   Allergies:  Allergies as of 07/30/2020 - Review Complete 07/30/2020  Allergen Reaction Noted  . Lipitor [atorvastatin calcium] Other (See Comments) 01/28/2012   Past Medical History:  Diagnosis Date  . Cellulitis 07/17/2020   LEFT HIP  . Diabetes mellitus   . Hyperlipidemia   . Hypertension     Family History:  Mother - had DM, died of old age Father - had heart disease and died of MI   Social History: Patient had been living at home alone prior to his time at [facility] s/p recent discharge 07/20/20. He states that he had smoked 1 PPD for many years but has recently cut down to 4 cigarettes per day. He states that he stopped drinking alcohol 11 years ago and denies  any other drug use.   Review of Systems: A complete ROS was negative except as per HPI.   History and ROS are limited secondary to altered mental status in the setting of hypoglycemia.   Physical Exam: Blood pressure (!) 104/54, pulse 65, temperature (!) 96 F (35.6 C), temperature source Rectal, resp. rate 18, height 5\' 10"  (1.778 m), weight 99.8 kg, SpO2 98 %. General: Patient appears slightly disheveled. No acute distress. Eyes: Eyes intermittently tearing. Sclera non-icteric. No conjunctival injection.  HENT: Dentition is poor. Mucus membranes slightly dry. No nasal discharge. Respiratory: Lungs are CTA, bilaterally. No wheezes, rales, or rhonchi.  Cardiovascular: Regular rate and rhythm. No murmurs, rubs, or gallops. There is trace bilateral pitting edema. Distal pulses are 2+ in all four extremities.  Abdominal: Soft and mildly distended. Bowel sounds intact. Mild suprapubic abdominal tenderness to palpation is present without rebound or guarding. No abdominal masses palpated. Skin: Skin feels diffusely slightly cool and clammy to the touch. There is a 1cmx1cm mildly erythematous lesion with packing along the left lateral thigh, without purulence or active drainage. Otherwise, no lesions or rashes noted.  Neurological: Patient was initially lethargic, with alternating levels of awareness throughout examination. He is oriented to person and place but not time, stating the year is 2001. CN II-XII grossly intact.  Musculoskeletal: Strength is 5/5 in all four extremities (although LLE exam limited secondary to pain).  Psych: Normal affect. Normal tone of voice.   EKG: personally reviewed my interpretation is normal sinus rhythm at a rate of ~80bpm. There are T wave inversions in leads V1, V2, and aVR. Unable to discern whether present on recent EKG from 07/15/20. No ST segment elevations or depressions. Normal PR and QTc intervals.   CXR: personally reviewed my interpretation is cardiomegaly  with vascular congestion and greater opacification of the left lower lobe, concerning for pneumonia.  Assessment & Plan by Problem: Active Problems:   Hypoglycemia  Symptomatic Hypoglycemia Patient endorses confusion, fatigue, sweating, and increased thirst. He has had waxing and waning levels of alertness. Nursing staff report his initial CBG with these symptoms was 49, and decreased to 39 s/p IM glucagon. Per EMS, patient was alert and oriented x 4. They gave him 1 ampule of D50 and his glucose improved from 59 to 149. On admission examination, patient's CBG was 99. Nursing staff report that patient had been eating well today and last had chips and cookies at 7:00pm. However, patient cannot recall eating since breakfast. Hypoglycemia may be secondary to sepsis (CXR concerning for PNA; recent LLE purulent cellulitis). May also be secondary to hypothyroidism given patient is hypothermic vs. Adrenal insufficiency. Cannot exclude endogenous pancreatic oversecretion of insulin.  - Will check serum glucose, insulin, proinsulin, C-peptide,  and beta-hydroxybutyric acid  - Will start regular diet for now; patient may require mixed-meal provocation testing  - Will start on Dextrose 10% at 6mL/hr; will likely need changed to LR vs. NS if patient tolerates PO diet - Will check blood cultures x 2  - Check TSH and free T4 - Check CMP and Magnesium  - Will check morning cortisol   Community Acquired Pneumonia  Patient endorses SOB and cough x 2 days with intermittent fevers and chills. CXR performed today shows opacification in the left lower lobe, concerning for pneumonia. SpO2 100% on room air. Patient had been taking Doxycycline since recent discharge 07/20/20 but was switched to Rocephin yesterday.  - Continue Rocephin 1g x 4 days - Start Azithromycin 500mg  PO x 1 today then 250mg  daily x 4 days  Recent MSSA Cellulitis of the Left Hip Patient was admitted 07/15/20-07/20/20 for L hip cellulitis caused by  MSSA. He was started on doxycycline for 14 days but was switched to Rocephin yesterday. Left hip wound appears to be healing well with minimal surrounding erythema and no active purulent drainage. Wound packing in place. - Continue Rocephin 1g daily for now, although not listed in sensitivities - Called micro to see if we could get additional susceptibilities but they are unavailable until 7am - Consulted wound care for dressing changes  Well-Controlled Type II DM Hgb A1c 07/15/20 was 6.9. Patient and nursing staff state he has not been taking any glucose-lowering medications. Patient has experienced recurrent hypoglycemia today.  - Continue monitoring CBG's  History of Hypertension  Blood pressures upon arrival were < 120/80. Most recent pressure elevated to 142/75. Patient is not on antihypertensives outside of hospital.  - Continue to monitor   Stable Normocytic Anemia Hemoglobin 9.9, MCV 95.3. Iron studies 07/15/20 showed low iron 37, low TIBC 199, saturation ratios 19 and elevated ferritin 343, consistent with either acute IDA or IDA in the setting of anemia of chronic disease.  - Monitor CBCs  - Monitor for gross hematuria  Recent Hematuria Patient had hematuria 07/15/20 and was found to have asymptomatic bacteriuria. He currently has mild suprapubic abdominal tenderness to palpation without dysuria, urgency or frequency.  - Will check bladder scan to r/o retention  - Check U/A w/ reflex microscopy  Chronic Pain Syndrome  Patient takes Oxycodone 10mg  q6hrs and Baclofen 5mg  BID with Miralax. Patient did complain of right foot pain that was difficult to assess given altered mental status though there is no increased swelling or discoloration to suggest presence of DVT. Distal pulses 2+ in all four extremities.  - Start Oxycodone 5mg  q6hrs  - Continue Miralax 17g packet daily PRN - Reassess right foot pain once mental status improves   History of Hyperlipidemia  LDL elevated to 129 in  2013 with no lipid panel since that time. Documented statin intolerance.  - Will require follow up as outpatient  Diet: Regular Diet  IVF: D10 @ 6mL/hr DVT PPx: Enoxaparin 40mg  SQ daily  Code Status: DNR  Dispo: Admit patient to Observation with expected length of stay less than 2 midnights.  Signed: , MD 07/31/2020, 4:54 AM  Pager: [phone] After 5pm on weekdays and 1pm on weekends: On Call pager: [phone]

## 2020-07-31 NOTE — ED Notes (Signed)
Lunch Tray Ordered @ 1048. °

## 2020-07-31 NOTE — ED Notes (Signed)
Pt transported to xray 

## 2020-07-31 NOTE — Progress Notes (Signed)
Subjective:  Hospital Day: 1  Pt was evaluated at bedside this morning and was noted to be resting in his bed. He reports feeling confused and like his brain isn't working well. He says that he doesn't remember anything from last night but was told that he was in the hospital because he was in a diabetic coma. He denies ever having issues with low blood sugar in the past or experiencing anything like this before. Over the last two days he has felt poorly and had low energy with subjective fevers and chills. When asked about any shortness of breath or cough, he states that he has had a mild cough but this resolved with drinking water and otherwise has had no complaints.  He also notes pain in his left leg and lower back. The pain in his leg is chronic but states that he feels that his wound on his left hip is getting worse and becoming more painful. 3 days ago he reports being dropped on his back when being weighed and says that his lower back pain has been much worse since then.  He was teary this morning recounting many struggles and challenging situations in his life in addition to these things. We discussed our plan to evaluate his lower back and hip as well as investigate the cause of his hypoglycemic episode last night. He reports having not eaten since arriving at the hospital so we made sure to inform his care team and get him a meal.  Objective: Vital signs in last 24 hours: Vitals:   07/31/20 0705 07/31/20 0805 07/31/20 1000 07/31/20 1106  BP: 118/60 132/62 119/60 119/78  Pulse: 78 83 82 92  Resp:      Temp:      TempSrc:      SpO2: 98% 100% 100% 97%  Weight:      Height:       Weight change:  No intake or output data in the 24 hours ending 07/31/20 1144   Physical Exam Vitals and nursing note reviewed.  Constitutional: no acute distress, lying comfortably in bed Cardiovascular: regular rate and rhythm, normal heart sounds Pulmonary: effort normal, normal breath sounds  bilaterally Abdominal: flat, nontender, no rebound tenderness, bowel sounds normal Musculoskeletal: no peripheral edema Skin: warm and dry Neurological: alert, no focal deficit Psychiatric: normal mood and affect, teary intermittently throughout  Lab Results: CBC Latest Ref Rng & Units 07/31/2020 07/30/2020 07/20/2020  WBC 4.0 - 10.5 K/uL 15.5(H) 11.0(H) 8.5  Hemoglobin 13.0 - 17.0 g/dL 9.8(L) 9.9(L) 11.3(L)  Hematocrit 39 - 52 % 31.4(L) 30.6(L) 34.4(L)  Platelets 150 - 400 K/uL 389 410(H) 307   CMP Latest Ref Rng & Units 07/31/2020 07/30/2020 07/20/2020  Glucose 70 - 99 mg/dL 86 108(H) 139(H)  BUN 8 - 23 mg/dL '21 21 13  '$ Creatinine 0.61 - 1.24 mg/dL 0.92 0.98 0.92  Sodium 135 - 145 mmol/L 136 134(L) 137  Potassium 3.5 - 5.1 mmol/L 3.9 3.6 3.9  Chloride 98 - 111 mmol/L 100 99 107  CO2 22 - 32 mmol/L '24 24 22  '$ Calcium 8.9 - 10.3 mg/dL 8.5(L) 8.4(L) 8.3(L)  Total Protein 6.5 - 8.1 g/dL 6.4(L) 6.0(L) -  Total Bilirubin 0.3 - 1.2 mg/dL 0.6 0.5 -  Alkaline Phos 38 - 126 U/L 82 78 -  AST 15 - 41 U/L 23 19 -  ALT 0 - 44 U/L 19 18 -   Lactic Acid: 1.5 Procalcitonin: 1.78 TSH: 5.897 T4, Free: 1.48 Cortisol - AM: 12.7 UA:  normal  Studies/Results: DG Chest 1 View IMPRESSION: 1. Cardiomegaly and mild pulmonary vascular congestion. 2. Left greater than right lower lobe airspace disease, concerning for pneumonia. Electronically Signed   By: San Morelle M.D.   On: 07/31/2020 04:34   DG Lumbar Spine 2-3 Views IMPRESSION: Multilevel degenerative change similar to that seen on the prior exam. No acute abnormality noted. Electronically Signed   By: Inez Catalina M.D.   On: 07/31/2020 11:12   DG HIP PORT UNILAT WITH PELVIS 1V LEFT IMPRESSION: Degenerative and postoperative changes. No acute fracture noted. Soft tissue air consistent with localized infection. Electronically Signed   By: Inez Catalina M.D.   On: 07/31/2020 11:11   Assessment/Plan: Active Problems:   Hypoglycemia  [name]  is a 64 year old male with a PMHx of well controlled DM, HTN, and HLD who presented to the ED from [facility] for AMS in the setting of hypoglycemia. Stable blood sugars and mental status since admission.  Symptomatic Hypoglycemia Admitted for confusion, profuse sweating, and lethargy with several low CBG levels, 39-49 that did not respond to glucagon administration. Initially he required IV D50 and D10 in the ED but has eaten food since his admission and maintained a steady blood glucose without further requirement for IV glucose since this morning. Procalcitonin 1.78 and Lactic acid 1.5, afebrile, and stable vitals otherwise make severe infection less likely, SIRS 1/4. Negative UA but signs of possible lung involvement on CXR, see below. However his WBC increased from 11.0 to 15.5. Will continue to monitor. TSH 5.8 from 3.6 on 7/28. Free T4 1.48. Though mild abnormalities present, unlikely to be the cause of his hypoglycemia. 8 AM cortisol returned normal so unlikely to be due to adrenal insufficiency. Cannot rule-out pancreatic mass causing excess insulin secretion. Unable to interpret proinsulin, c-peptide, and insulin level given glucose above 55 at the time of draw. It is also unlikely that he was given exogenous insulin or any glucose lowering medications as he does not currently have any prescribed to him. His care team at [facility] reports being unaware of his diabetes diagnosis. It is possible this may be due postprandial hypoglycemia and may require mixed-meal tolerance test as his acute presentation is stabilized. - continue regular diet with CBG monitoring - PO intake improved today with stable and normal blood glucose, d/c D10 - f/u blood cultures x 2 - Check BMP in AM   Community Acquired Pneumonia  Patient endorses SOB and cough x 2 days with intermittent fevers and chills, though he reports that his cough was brief and resolved after drinking water and no longer has  shortness of breath. Clear lung sounds bilaterally on auscultation and afebrile. CXR performed yesterday was not ideal due to patient positioning and overlying telemetry wires but showed opacification in the left lower lobe, concerning for pneumonia. Also has rising leukocytosis to 15.5. Will continue abx treatment at this time. - Continue Rocephin 1g x 4 days - Azithromycin '500mg'$  PO x 1 today then '250mg'$  daily x 4 days  Lower Back Pain Chronic, worse after being dropped while being weighed 3 days ago. Lumbar spine x-ray shows no acute changes. -oxycodone '5mg'$ , two tabs q6h prn -tylenol 650 mg, two tabs q6h prn  Recent MSSA Cellulitis of the Left Hip Patient was admitted 07/15/20-07/20/20 for L hip cellulitis caused by MSSA. He completed 11 day course of doxycycline. Has had worsening pain over the last few days at the site and placed on Rocephin at  SNF. Left hip x-ray shows soft tissue air consistent with infection, consistent with prior CT hip on 7/25 noting air extending 7 cm deep to the skin surface. Wound visually appears improved. Will obtain ESR and CRP to assess for further need for investigation for osteomyelitis. - f/u CRP, ESR - Consulted wound care for dressing changes  Well-Controlled Type II DM Hgb A1c 07/15/20 was 6.9. Patient and nursing staff state he has not been taking any glucose-lowering medications. - Continue monitoring CBG's  History of Hypertension  Blood pressures upon arrival were < 120/80. Most recent 130s/60s. Patient is not on antihypertensives outside of hospital.  - Continue to monitor    Stable Normocytic Anemia Hemoglobin 9.9, MCV 95.3. Iron studies 07/15/20 showed low iron 37, low TIBC 199, saturation ratios 19 and elevated ferritin 343, consistent with either acute IDA or IDA in the setting of anemia of chronic disease. No hematuria noted on UA. - Monitor CBCs  Chronic Pain Syndrome  Patient takes Oxycodone '10mg'$  q6hrs and Baclofen '5mg'$  BID with Miralax. Pt  no longer complaining of right foot pain. - Start Oxycodone '5mg'$  q6hrs prn - Continue Miralax 17g packet daily PRN  This is a Careers information officer Note.  The care of the patient was discussed with Dr. Coy Saunas and Koleen Distance and the assessment and plan formulated with their assistance.   LOS: 0 days   [name], Medical Student 07/31/2020, 11:44 AM

## 2020-07-31 NOTE — Evaluation (Signed)
Physical Therapy Evaluation Patient Details Name: [name] MRN: [id number] DOB: March 29, 1956 Today's Date: 07/31/2020   History of Present Illness  Pt is a 64 y/o male admitted secondary to symptomatic hypoglycemia and CAP. PMH includes DM, HTN, and recent R hip cellulitis.   Clinical Impression  Pt admitted secondary to problem above with deficits below. Pt seen in ED and very limited in mobility tolerance secondary to pain. Pt requiring min to mod A for rolling from side to side. Yelling out in pain with mobility. Pt currently at SNF and recommend return at d/c. Will continue to follow acutely to maximize functional mobility independence and safety.     Follow Up Recommendations SNF;Supervision/Assistance - 24 hour    Equipment Recommendations  None recommended by PT    Recommendations for Other Services       Precautions / Restrictions Precautions Precautions: Fall Precaution Comments: Had fall at SNF  Restrictions Weight Bearing Restrictions: No      Mobility  Bed Mobility Overal bed mobility: Needs Assistance Bed Mobility: Rolling Rolling: Min assist;Mod assist         General bed mobility comments: Min to mod A to roll from side to side on ED stretcher. Pt yelling out in pain while rolling and refusing further mobility.   Transfers                    Ambulation/Gait                Stairs            Wheelchair Mobility    Modified Rankin (Stroke Patients Only)       Balance                                             Pertinent Vitals/Pain Pain Assessment: Faces Faces Pain Scale: Hurts whole lot Pain Location: low back and L hip Pain Descriptors / Indicators: Grimacing;Constant;Aching Pain Intervention(s): Limited activity within patient's tolerance;Monitored during session;Repositioned    Home Living Family/patient expects to be discharged to:: Skilled nursing facility                       Prior Function Level of Independence: Needs assistance   Gait / Transfers Assistance Needed: Reports Needs a lot of assist with transfers to [facility]   ADL's / Homemaking Assistance Needed: Reports need assist with ADLs        Hand Dominance        Extremity/Trunk Assessment   Upper Extremity Assessment Upper Extremity Assessment: Generalized weakness    Lower Extremity Assessment Lower Extremity Assessment: LLE deficits/detail;RLE deficits/detail RLE Deficits / Details: Able to perform heel slide, but reports it causes increased pain and muscle spasms.  LLE Deficits / Details: Limited ROM secondary to pain in R hip. Pt also reporting muscle spasms with all movement. Required assist to perform heel slides.        Communication   Communication: No difficulties  Cognition Arousal/Alertness: Awake/alert Behavior During Therapy: WFL for tasks assessed/performed Overall Cognitive Status: No family/caregiver present to determine baseline cognitive functioning                                        General Comments  Exercises     Assessment/Plan    PT Assessment Patient needs continued PT services  PT Problem List Decreased strength;Decreased range of motion;Decreased activity tolerance;Decreased balance;Decreased mobility;Pain;Impaired sensation;Decreased knowledge of precautions;Decreased safety awareness;Decreased knowledge of use of DME;Decreased coordination;Decreased skin integrity       PT Treatment Interventions DME instruction;Functional mobility training;Therapeutic activities;Therapeutic exercise;Balance training;Neuromuscular re-education;Cognitive remediation;Patient/family education    PT Goals (Current goals can be found in the Care Plan section)  Acute Rehab PT Goals Patient Stated Goal: to decrease pain  PT Goal Formulation: With patient Time For Goal Achievement: 08/14/20 Potential to Achieve Goals: Fair    Frequency Min 2X/week    Barriers to discharge        Co-evaluation               AM-PAC PT "6 Clicks" Mobility  Outcome Measure Help needed turning from your back to your side while in a flat bed without using bedrails?: A Lot Help needed moving from lying on your back to sitting on the side of a flat bed without using bedrails?: Total Help needed moving to and from a bed to a chair (including a wheelchair)?: Total Help needed standing up from a chair using your arms (e.g., wheelchair or bedside chair)?: Total Help needed to walk in hospital room?: Total Help needed climbing 3-5 steps with a railing? : Total 6 Click Score: 7    End of Session   Activity Tolerance: Patient limited by pain Patient left: in bed;with call bell/phone within reach (on stretcher in ED ) Nurse Communication: Mobility status PT Visit Diagnosis: Muscle weakness (generalized) (M62.81);Unsteadiness on feet (R26.81);Pain;Difficulty in walking, not elsewhere classified (R26.2) Pain - Right/Left: Left Pain - part of body: Hip (back)    Time: 1218-1228 PT Time Calculation (min) (ACUTE ONLY): 10 min   Charges:   PT Evaluation $PT Eval Moderate Complexity: 1 Mod          Farley Ly, PT, DPT  Acute Rehabilitation Services  Pager: [phone] Office: [phone]   Lehman Prom 07/31/2020, 5:27 PM

## 2020-07-31 NOTE — ED Notes (Signed)
PT at bedside.

## 2020-07-31 NOTE — Social Work (Signed)
Pt from [facility].   CSW continuing to follow for support with disposition when medically appropriate.  Octavio Graves, MSW, LCSW [facility] Clinical Social Work

## 2020-07-31 NOTE — Consult Note (Signed)
WOC Nurse Consult Note: Patient receiving care in [facility] ED 18.  Consult completed remotely after review of record, including left hip wound photo. Reason for Consult: "left hip wound" Wound type: full thickness wound Pressure Injury POA: Yes/No/NA Measurement: To be provided by the bedside RN in the flowsheet section Wound bed: Drainage (amount, consistency, odor) heavy reddish drainage on existing dressing--see photo Periwound: intact Dressing procedure/placement/frequency: Using a cotton tipped applicator, insert 1/4" Iodoform packing strip [facility] [place] [phone]) into the left hip wound. Leave a portion hanging out for easy removal. Cover with ABD pad, tape in place.  Thank you for the consult. WOC nurse will not follow at this time.  Please re-consult the WOC team if needed.  Helmut Muster, RN, MSN, CWOCN, CNS-BC, pager [phone]

## 2020-08-01 ENCOUNTER — Inpatient Hospital Stay (HOSPITAL_COMMUNITY): Payer: Medicare Other

## 2020-08-01 LAB — C-PEPTIDE: C-Peptide: 0.9 ng/mL — ABNORMAL LOW (ref 1.1–4.4)

## 2020-08-01 LAB — BLOOD CULTURE ID PANEL (REFLEXED) - BCID2

## 2020-08-01 LAB — C-REACTIVE PROTEIN: CRP: 9.1 mg/dL — ABNORMAL HIGH (ref <1.0)

## 2020-08-01 LAB — GLUCOSE, CAPILLARY
Glucose-Capillary: 129 mg/dL — ABNORMAL HIGH (ref 70–99)
Glucose-Capillary: 135 mg/dL — ABNORMAL HIGH (ref 70–99)
Glucose-Capillary: 142 mg/dL — ABNORMAL HIGH (ref 70–99)
Glucose-Capillary: 157 mg/dL — ABNORMAL HIGH (ref 70–99)
Glucose-Capillary: 200 mg/dL — ABNORMAL HIGH (ref 70–99)

## 2020-08-01 LAB — SEDIMENTATION RATE: Sed Rate: 103 mm/hr — ABNORMAL HIGH (ref 0–16)

## 2020-08-01 MED ORDER — POLYETHYLENE GLYCOL 3350 17 G PO PACK
17.0000 g | PACK | Freq: Every day | ORAL | Status: DC
  Administered 2020-08-01: 17 g via ORAL
  Filled 2020-08-01: qty 1

## 2020-08-01 MED ORDER — GADOBUTROL 1 MMOL/ML IV SOLN
10.0000 mL | Freq: Once | INTRAVENOUS | Status: AC | PRN
  Administered 2020-08-01: 10 mL via INTRAVENOUS

## 2020-08-01 MED ORDER — BACLOFEN 10 MG PO TABS
5.0000 mg | ORAL_TABLET | Freq: Two times a day (BID) | ORAL | Status: DC
  Administered 2020-08-01 – 2020-08-06 (×10): 5 mg via ORAL
  Filled 2020-08-01 (×12): qty 1

## 2020-08-01 MED ORDER — POLYETHYLENE GLYCOL 3350 17 G PO PACK
17.0000 g | PACK | Freq: Two times a day (BID) | ORAL | Status: DC
  Administered 2020-08-01: 17 g via ORAL
  Filled 2020-08-01 (×6): qty 1

## 2020-08-01 NOTE — Evaluation (Signed)
Occupational Therapy Evaluation Patient Details Name: [name] MRN: [id number] DOB: 01-Dec-1956 Today's Date: 08/01/2020    History of Present Illness Pt is a 64 y/o male admitted secondary to symptomatic hypoglycemia and CAP. PMH includes DM, HTN, and recent R hip cellulitis.    Clinical Impression   Patient admitted from short-term rehab where he was d/c to from [facility] after fall with imaging (-) fx. ~7/21 and dx. of cellulitis. Prior to 7/21, patient was living along in a mobile home with progressive decline in function requiring heavy assistance for functional transfers to wc. Patient currently presents with deficits in balance, activity tolerance, safety, strength, and mobility requiring +2 assist for LB BADLs and functional transfers with RW. Patient would benefit from continued acute OT services in prep for d/c to next level of care with recommendation for SNF rehab.     Follow Up Recommendations  SNF    Equipment Recommendations  Other (comment) (Defer to next level of care)    Recommendations for Other Services       Precautions / Restrictions Precautions Precautions: Fall Precaution Comments: Had fall at SNF  Restrictions Weight Bearing Restrictions: No      Mobility Bed Mobility Overal bed mobility: Needs Assistance Bed Mobility: Supine to Sit       Sit to supine: Min assist   General bed mobility comments: Min A with HOB elevated and use of bed rail  Transfers Overall transfer level: Needs assistance Equipment used: Rolling walker (2 wheeled) Transfers: Sit to/from [employer] Sit to Stand: +2 physical assistance;From elevated surface;Max assist Stand pivot transfers: Max assist;+2 physical assistance;+2 safety/equipment       General transfer comment: EOB elevated for sit to stand x2 trials from EOB and stand-pivot to recliner with shuffle-like gait.     Balance Overall balance assessment: Needs assistance Sitting-balance support:  Bilateral upper extremity supported;Feet supported Sitting balance-Leahy Scale: Fair     Standing balance support: Bilateral upper extremity supported Standing balance-Leahy Scale: Poor Standing balance comment: Heavy reliance on BUE and external assist                           ADL either performed or assessed with clinical judgement   ADL Overall ADL's : Needs assistance/impaired                 Upper Body Dressing : Moderate assistance;Sitting   Lower Body Dressing: Maximal assistance;+2 for physical assistance;Sit to/from stand;Sitting/lateral leans Lower Body Dressing Details (indicate cue type and reason): Max A to don footwear in supine Toilet Transfer: Maximal assistance;+2 for physical assistance;+2 for safety/equipment;RW Toilet Transfer Details (indicate cue type and reason): Simulated with stand-pivot transfer to recliner with shuffle-like steps and use of RW requiring Max A +2.            General ADL Comments: Patient able to take 2 forward steps with Max A +2 and use of RW.      Vision Baseline Vision/History: Wears glasses (Patient self-reporting low vision 2/2 DM) Wears Glasses: At all times (Glasses not available this date) Patient Visual Report: No change from baseline       Perception     Praxis      Pertinent Vitals/Pain Pain Assessment: 0-10 Pain Score: 10-Worst pain ever Pain Location: low back and L hip Pain Descriptors / Indicators: Grimacing;Constant;Aching Pain Intervention(s): Monitored during session;Premedicated before session     Hand Dominance Right   Extremity/Trunk Assessment Upper  Extremity Assessment Upper Extremity Assessment: Generalized weakness (Numbness/tingling in bilateral hands and feet 2/2 neuropathy)   Lower Extremity Assessment Lower Extremity Assessment: Defer to PT evaluation   Cervical / Trunk Assessment Cervical / Trunk Assessment: Kyphotic   Communication Communication Communication: No  difficulties   Cognition Arousal/Alertness: Awake/alert Behavior During Therapy: WFL for tasks assessed/performed Overall Cognitive Status: No family/caregiver present to determine baseline cognitive functioning                                     General Comments  Patient c/o dizziness upon sitting EOB. Vitals monitored HR 97-100bpm, SpO2 100% on RA, and BP 133/73. No significant change in vitals with activity.      Exercises     Shoulder Instructions      Home Living Family/patient expects to be discharged to:: Skilled nursing facility Living Arrangements: Other (Comment) Available Help at Discharge: Family;Available PRN/intermittently (From home alone )                                    Prior Functioning/Environment Level of Independence: Needs assistance  Gait / Transfers Assistance Needed: Reports Needs a lot of assist with transfers to [facility] with ability to take a few steps with RW for stand-pivot transfers.  ADL's / Homemaking Assistance Needed: Reports need assist with ADLs            OT Problem List: Decreased strength;Decreased activity tolerance;Impaired balance (sitting and/or standing);Decreased coordination;Decreased safety awareness;Decreased knowledge of use of DME or AE;Impaired sensation;Pain      OT Treatment/Interventions: Self-care/ADL training;Therapeutic exercise;Energy conservation;DME and/or AE instruction;Therapeutic activities;Patient/family education;Balance training    OT Goals(Current goals can be found in the care plan section) Acute Rehab OT Goals Patient Stated Goal: to decrease pain  OT Goal Formulation: With patient Time For Goal Achievement: 08/15/20 Potential to Achieve Goals: Good ADL Goals Pt Will Perform Lower Body Bathing: with min assist;with adaptive equipment;sitting/lateral leans;sit to/from stand Pt Will Perform Lower Body Dressing: with min assist;with adaptive equipment;sitting/lateral leans;sit  to/from stand Pt Will Perform Toileting - Clothing Manipulation and hygiene: with min assist;sitting/lateral leans;sit to/from stand Pt/caregiver will Perform Home Exercise Program: Both right and left upper extremity;Increased strength;With written HEP provided  OT Frequency: Min 2X/week   Barriers to D/C:            Co-evaluation              AM-PAC OT "6 Clicks" Daily Activity     Outcome Measure Help from another person eating meals?: A Little Help from another person taking care of personal grooming?: A Little Help from another person toileting, which includes using toliet, bedpan, or urinal?: A Lot Help from another person bathing (including washing, rinsing, drying)?: A Lot Help from another person to put on and taking off regular upper body clothing?: A Lot Help from another person to put on and taking off regular lower body clothing?: A Lot 6 Click Score: 14   End of Session Equipment Utilized During Treatment: Gait belt;Rolling walker Nurse Communication: Need for lift equipment  Activity Tolerance: Patient tolerated treatment well Patient left: in chair;with call bell/phone within reach;with chair alarm set  OT Visit Diagnosis: Unsteadiness on feet (R26.81);Repeated falls (R29.6);Muscle weakness (generalized) (M62.81)                Time: 6283-1517  OT Time Calculation (min): 21 min Charges:  OT General Charges $OT Visit: 1 Visit OT Evaluation $OT Eval Moderate Complexity: 1 Mod  Jarid Sasso H. OTR/L Supplemental OT, Department of rehab services [phone]  Shawndell Schillaci R H. 08/01/2020, 1:43 PM

## 2020-08-01 NOTE — Progress Notes (Signed)
Subjective:  Hospital Day: 2  [name] was evaluated at bedside this morning. We discussed several items this morning: 1.) He reports having generalized abdominal pain and pressure this morning. He has not had a bowel movement since approximately 3 days ago. Denies any nausea or vomiting. He had lunch and dinner yesterday and breakfast this morning.  2.) This morning he has had spasticity and cramping throughout his body that seems to come and go. He has a history of this for which he takes Baclofen.  3.) [name] also notes that he has urinated about 3 times this morning. He had no pain and did not notice blood but notes that they were relatively low volume. He also notes some pressure and tenderness in his suprapubic area. 4.) His cough has worsened and no longer improved after drinking water. He reports that is is brining up sputum intermittently but he has not seen the color. He denies feeling congested in his chest but does report some shortness of breath.  The team examined his wound which appeared to be healing well without erythema, purulence, or warmth of the surrounding skin. The packing and bandages were already soaked from his bandage change earlier in the morning. The team changed his wound and repositioned [name] comfortably in bed.  Afternoon Update: After calling [facility], they informed us that [name] was started on IM Rocephin by his wound care physician because he spiked a fever on Sunday before he had his hypoglycemic episode that brought him to [facility]. They collected a wound culture at the time which has not resulted yet. They also noted he started to develop a slight cough on Sunday as well.  Objective: Vital signs in last 24 hours: Vitals:   07/31/20 1643 07/31/20 2012 08/01/20 0003 08/01/20 0412  BP: 134/60 (!) 119/57 128/71 126/70  Pulse: 93 (!) 101 100 98  Resp: '16 19 19 18  '$ Temp: 97.6 F (36.4 C) 98.2 F (36.8 C) 98.1 F (36.7 C) 98  F (36.7 C)  TempSrc: Oral Oral Oral Oral  SpO2: 98% 98% 100% 100%  Weight:      Height:       Weight change:  No intake or output data in the 24 hours ending 08/01/20 0550   Physical Exam Vitals and nursing note reviewed.  Constitutional: no acute distress, lying comfortably in bed Cardiovascular: regular rate and rhythm, normal heart sounds Pulmonary: effort normal, rales noted left mid-lower lung field Abdominal: flat, generalized tenderness to palpation with greatest intensity in suprapubic region, no rebound tenderness, bowel sounds normal Musculoskeletal: no peripheral edema Skin: warm and dry, wound appeared healthy with nonerythematous or purulent opening, recently packed dressing was soaked in slightly bloody discharge Neurological: alert, no focal deficit Psychiatric: normal mood and affect  Lab Results: CBC Latest Ref Rng & Units 07/31/2020 07/30/2020 07/20/2020  WBC 4.0 - 10.5 K/uL 15.5(H) 11.0(H) 8.5  Hemoglobin 13.0 - 17.0 g/dL 9.8(L) 9.9(L) 11.3(L)  Hematocrit 39 - 52 % 31.4(L) 30.6(L) 34.4(L)  Platelets 150 - 400 K/uL 389 410(H) 307   CMP Latest Ref Rng & Units 07/31/2020 07/30/2020 07/20/2020  Glucose 70 - 99 mg/dL 86 108(H) 139(H)  BUN 8 - 23 mg/dL '21 21 13  '$ Creatinine 0.61 - 1.24 mg/dL 0.92 0.98 0.92  Sodium 135 - 145 mmol/L 136 134(L) 137  Potassium 3.5 - 5.1 mmol/L 3.9 3.6 3.9  Chloride 98 - 111 mmol/L 100 99 107  CO2 22 - 32 mmol/L 24 24 22  Calcium 8.9 - 10.3 mg/dL 8.5(L) 8.4(L) 8.3(L)  Total Protein 6.5 - 8.1 g/dL 6.4(L) 6.0(L) -  Total Bilirubin 0.3 - 1.2 mg/dL 0.6 0.5 -  Alkaline Phos 38 - 126 U/L 82 78 -  AST 15 - 41 U/L 23 19 -  ALT 0 - 44 U/L 19 18 -   Imaging: DG Chest 2 View IMPRESSION: Mild left lower lobe opacity, atelectasis versus pneumonia. Electronically Signed   By: Julian Hy M.D.   On: 08/01/2020 10:55   Assessment/Plan: Principal Problem:   Hypoglycemia Active Problems:   Purulent Cellulitis of left hip   Chronic pain  Mr.  Kane is a 64 year old male with a PMHx of well controlled DM, HTN, and HLD who presented to the ED from [facility] for AMS in the setting of hypoglycemia. Stable, normal blood sugars and mental status since admission. Infection suspected cause of hypoglycemic episode.  Symptomatic Hypoglycemia Admitted for confusion, profuse sweating, and lethargy with several low CBG levels, 39-49 that did not respond to glucagon administration. [name] has required no further IV glucose today and has been eating well and maintaining glucose levels above 100 since his admission. Given two possible sources of infection at this time, infection is the most likely cause of his hypoglycemia and AMS. He is currently being treated for CAP found on x-ray after his complaints of productive cough and shortness of breath. Pending MRI for possible worsening left hip cellulitis/osteomyelitis, see below. - continue regular diet - PO intake improved today with stable and normal blood glucose - d/c q4 CBGs - f/u blood cultures x 2 - Check BMP in AM   Recent MSSA Cellulitis of the Left Hip Elevated CRP to 9.1 and ESR to 103 with history of MSSA cellulitis, worsening left hip pain, fever, and chills. Ordered MRI left hip to evaluate for osteomyelitis. - Consulted wound care for dressing changes - f/u left hip MRI  Community Acquired Pneumonia  Patient endorses SOB and cough x 2 days with intermittent fevers and chills, reports worsening of his cough with intermittent sputum. Rales left lower lung field on exam. 2-view CXR performed today confirms left lower lobe consolidation. Also has rising leukocytosis to 15.5. Will continue tx for CAP. - Continue Rocephin 1g, Day 2/4 - Azithromycin '250mg'$ , Day 1/4  Spasticity [name] reported this morning that he had generalized muscle cramps and spasticity. This is a chronic problem for him for which he takes baclofen. This was held in the ED due to his AMS. Will  restart today and assess for improvement. -start baclofen 5 mg BID  Constipation Pt reports not having a bowel movement in 3 days and is having abdominal pressure and tenderness. Denies nausea and vomiting and has been eating well since arrival in the ED. Will provide bowel regimen and watch for bowel movement. - Miralax 17g packet  Polyuria Pt reports 3 small volume urinations this morning, approximately 200-300cc each. Denied pain with urination, difficulty initiating a stream, or blood in his urine. He did have suprapubic tenderness. UA on 8/10 showed no signs of infection. was Bladder scan showed 200cc residual volume. Likely in part aggravated by constipation as well as lying down to urinate. Will assess for improvement pending bowel regimen and BM.  Well-Controlled Type II DM Hgb A1c 07/15/20 was 6.9. Patient and nursing staff state he has not been taking any glucose-lowering medications. Hypoglycemic episode likely due to acute infection.  Stable Normocytic Anemia Hemoglobin 9.9, MCV 95.3. Iron  studies 07/15/20 showed low iron 37, low TIBC 199, saturation ratios 19 and elevated ferritin 343, consistent with either acute IDA or IDA in the setting of anemia of chronic disease. No hematuria noted on UA. - Monitor CBCs  Chronic Pain Syndrome  Patient takes Oxycodone '10mg'$  q6hrs and Baclofen '5mg'$  BID with Miralax. Pt no longer complaining of right foot pain. - Start Oxycodone '5mg'$  q6hrs prn - Continue Miralax 17g packet daily PRN  This is a Careers information officer Note.  The care of the patient was discussed with Dr. Coy Saunas and Koleen Distance and the assessment and plan formulated with their assistance.   LOS: 1 day   Calvert Cantor, Medical Student 08/01/2020, 5:50 AM

## 2020-08-01 NOTE — TOC Initial Note (Signed)
Transition of Care [facility]) - Initial/Assessment Note    Patient Details  Name: [name] MRN: [id number] Date of Birth: [birth year]  Transition of Care [facility]) CM/SW Contact:    Doy Hutching, LCSW Phone Number: 08/01/2020, 12:31 PM  Clinical Narrative:                 CSW spoke with pt at bedside. Introduced self, role, reason for visit. Pt  from home alone. Confirmed home address and PCP. He has arrived at [facility] from [facility]. He is interested in seeing options closer to home if possible. At this time CSW received permission to speak with his brother if needed, also received permission to send SNF referral out on hub and bring back offers as available. Pt will need prior auth to d/c to SNF. Continued medical work up, will provide offers as received. Per previous admission pt is unvaccinated.  Expected Discharge Plan: Skilled Nursing Facility Barriers to Discharge: Continued Medical Work up, English as a second language teacher   Patient Goals and CMS Choice Patient states their goals for this hospitalization and ongoing recovery are:: go to a new SNF closer to home if possible CMS Medicare.gov Compare Post Acute Care list provided to:: Patient Choice offered to / list presented to : Patient  Expected Discharge Plan and Services Expected Discharge Plan: Skilled Nursing Facility In-house Referral: Clinical Social Work Discharge Planning Services: CM Consult Living arrangements for the past 2 months: Single Family Home, Skilled Nursing Facility  Prior Living Arrangements/Services Living arrangements for the past 2 months: Single Family Home, Skilled Nursing Facility Lives with:: Self Patient language and need for interpreter reviewed:: Yes (no needs) Do you feel safe going back to the place where you live?: Yes      Need for Family Participation in Patient Care: Yes (Comment) (assistance as needed) Care giver support system in place?: Yes (comment) (pt brother) Current home  services: DME Criminal Activity/Legal Involvement Pertinent to Current Situation/Hospitalization: No - Comment as needed  Activities of Daily Living Home Assistive Devices/Equipment: Cane (specify quad or straight) ADL Screening (condition at time of admission) Patient's cognitive ability adequate to safely complete daily activities?: Yes Is the patient deaf or have difficulty hearing?: No Does the patient have difficulty seeing, even when wearing glasses/contacts?: No Does the patient have difficulty concentrating, remembering, or making decisions?: No Patient able to express need for assistance with ADLs?: Yes Does the patient have difficulty dressing or bathing?: No Independently performs ADLs?: Yes (appropriate for developmental age) Communication: Independent Dressing (OT): Independent Is this a change from baseline?: Pre-admission baseline Grooming: Independent Is this a change from baseline?: Pre-admission baseline Feeding: Independent Is this a change from baseline?: Pre-admission baseline Bathing: Independent Is this a change from baseline?: Pre-admission baseline Toileting: Independent Is this a change from baseline?: Pre-admission baseline In/Out Bed: Independent Is this a change from baseline?: Pre-admission baseline Does the patient have difficulty walking or climbing stairs?: Yes Weakness of Legs: Both Weakness of Arms/Hands: None  Permission Sought/Granted Permission sought to share information with : Family Supports Permission granted to share information with : Yes, Verbal Permission Granted  Share Information with NAME: Vasiliy Mccarry  Permission granted to share info w AGENCY: SNFs  Permission granted to share info w Relationship: brother  Permission granted to share info w Contact Information: [phone]  Emotional Assessment Appearance:: Appears stated age Attitude/Demeanor/Rapport: Engaged Affect (typically observed): Accepting, Adaptable,  Appropriate Orientation: : Oriented to Situation, Oriented to  Time, Oriented to Place, Oriented to Self  Alcohol / Substance Use: Not Applicable Psych Involvement: No (comment)  Admission diagnosis:  Shortness of breath [R06.02] Hip pain [M25.559] Hypoglycemia [E16.2] Patient Active Problem List   Diagnosis Date Noted  . Hypoglycemia 07/31/2020  . Purulent Cellulitis of left hip 07/15/2020  . Recurrent falls 07/15/2020  . Chronic pain 07/15/2020  . 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 Physical deconditioning 07/15/2020  . Asymptomatic bacteriuria 07/15/2020  . Diabetes mellitus (HCC) 01/28/2012   PCP:  Patient, No Pcp Per Pharmacy:  No Pharmacies Listed   Readmission Risk Interventions Readmission Risk Prevention Plan 08/01/2020  Post Dischage Appt Complete  Medication Screening Complete  Transportation Screening Complete  Some recent data might be hidden

## 2020-08-01 NOTE — Progress Notes (Signed)
PHARMACY - PHYSICIAN COMMUNICATION CRITICAL VALUE ALERT - BLOOD CULTURE IDENTIFICATION (BCID)  [name] is an 64 y.o. male who presented to [facility] on 07/30/2020 with a chief complaint of shortness of breath and cough  Patient presented with 2 day history of cough and shortness of breath. Couch resolved by drinking water, no longer short of breath. CXR showed opacification in the left lower lobe. Currently treated inpatient for CAP.  Recent WBC (8/10) 15.5, afebrile with clear lung sounds, oxygenating 100% on room air.   Assessment:  Patient is clinically stable. BCID 1/4 bottles positive for Staph. Epidermidis, MecA positive. Probable contaminant.   Name of physician (or Provider) Contacted: Dr. Kirke Corin  Current antibiotics: ceftriaxone, azithromycin  Changes to prescribed antibiotics recommended:  Patient is on recommended antibiotics - No changes needed  Will continue to follow culture results.  Results for orders placed or performed during the hospital encounter of 07/30/20  Blood Culture ID Panel (Reflexed) (Collected: 07/31/2020  4:21 AM)  Result Value Ref Range   Enterococcus faecalis NOT DETECTED NOT DETECTED   Enterococcus Faecium NOT DETECTED NOT DETECTED   Listeria monocytogenes NOT DETECTED NOT DETECTED   Staphylococcus species DETECTED (A) NOT DETECTED   Staphylococcus aureus (BCID) NOT DETECTED NOT DETECTED   Staphylococcus epidermidis DETECTED (A) NOT DETECTED   Staphylococcus lugdunensis NOT DETECTED NOT DETECTED   Streptococcus species NOT DETECTED NOT DETECTED   Streptococcus agalactiae NOT DETECTED NOT DETECTED   Streptococcus pneumoniae NOT DETECTED NOT DETECTED   Streptococcus pyogenes NOT DETECTED NOT DETECTED   A.calcoaceticus-baumannii NOT DETECTED NOT DETECTED   Bacteroides fragilis NOT DETECTED NOT DETECTED   Enterobacterales NOT DETECTED NOT DETECTED   Enterobacter cloacae complex NOT DETECTED NOT DETECTED   Escherichia coli NOT DETECTED NOT  DETECTED   Klebsiella aerogenes NOT DETECTED NOT DETECTED   Klebsiella oxytoca NOT DETECTED NOT DETECTED   Klebsiella pneumoniae NOT DETECTED NOT DETECTED   Proteus species NOT DETECTED NOT DETECTED   Salmonella species NOT DETECTED NOT DETECTED   Serratia marcescens NOT DETECTED NOT DETECTED   Haemophilus influenzae NOT DETECTED NOT DETECTED   Neisseria meningitidis NOT DETECTED NOT DETECTED   Pseudomonas aeruginosa NOT DETECTED NOT DETECTED   Stenotrophomonas maltophilia NOT DETECTED NOT DETECTED   Candida albicans NOT DETECTED NOT DETECTED   Candida auris NOT DETECTED NOT DETECTED   Candida glabrata NOT DETECTED NOT DETECTED   Candida krusei NOT DETECTED NOT DETECTED   Candida parapsilosis NOT DETECTED NOT DETECTED   Candida tropicalis NOT DETECTED NOT DETECTED   Cryptococcus neoformans/gattii NOT DETECTED NOT DETECTED   Methicillin resistance mecA/C DETECTED (A) NOT DETECTED    Lamar Sprinkles, PharmD PGY1 Pharmacy Resident 08/01/2020 11:14 AM

## 2020-08-02 DIAGNOSIS — L03116 Cellulitis of left lower limb: Secondary | ICD-10-CM

## 2020-08-02 LAB — CBC
HCT: 29.2 % — ABNORMAL LOW (ref 39.0–52.0)
Hemoglobin: 9.4 g/dL — ABNORMAL LOW (ref 13.0–17.0)
MCH: 30 pg (ref 26.0–34.0)
MCHC: 32.2 g/dL (ref 30.0–36.0)
MCV: 93.3 fL (ref 80.0–100.0)
Platelets: 376 10*3/uL (ref 150–400)
RBC: 3.13 MIL/uL — ABNORMAL LOW (ref 4.22–5.81)
RDW: 14.4 % (ref 11.5–15.5)
WBC: 6.7 10*3/uL (ref 4.0–10.5)
nRBC: 0 % (ref 0.0–0.2)

## 2020-08-02 LAB — BASIC METABOLIC PANEL
Anion gap: 8 (ref 5–15)
BUN: 10 mg/dL (ref 8–23)
CO2: 26 mmol/L (ref 22–32)
Calcium: 8.4 mg/dL — ABNORMAL LOW (ref 8.9–10.3)
Chloride: 106 mmol/L (ref 98–111)
Creatinine, Ser: 0.86 mg/dL (ref 0.61–1.24)
GFR calc Af Amer: >60 mL/min (ref >60)
GFR calc non Af Amer: >60 mL/min (ref >60)
Glucose, Bld: 109 mg/dL — ABNORMAL HIGH (ref 70–99)
Potassium: 3.9 mmol/L (ref 3.5–5.1)
Sodium: 140 mmol/L (ref 135–145)

## 2020-08-02 MED ORDER — DOXYCYCLINE HYCLATE 100 MG PO TABS
100.0000 mg | ORAL_TABLET | Freq: Two times a day (BID) | ORAL | Status: DC
  Administered 2020-08-02 – 2020-08-06 (×7): 100 mg via ORAL
  Filled 2020-08-02 (×9): qty 1

## 2020-08-02 NOTE — Progress Notes (Signed)
Pt transferred to a low bed, sacral foam placed for stage 2 on sacral area, pt had BM.  Ortho in to see wound and the wound was redressed.  Slightly bloody drainage on the iodoform gauze when repacked.

## 2020-08-02 NOTE — Consult Note (Signed)
Reason for Consult:Left hip abscess Referring Physician: Marcheta GrammesJ Williams  [name] is an 64 y.o. male.  HPI: [name] was admitted 2d ago with hypoglycemia. He has had an ongoing problem with cellulitis of the left hip that has been present for 2-3 months. He says it began when he fell on concrete and developed a wound over the hip. He was admitted for this in late July and was found to have a MSSA infection and was discharge on doxycycline which the SNF says he has taken but he reports no improvement.  Past Medical History:  Diagnosis Date  . Cellulitis 07/17/2020   LEFT HIP  . Diabetes mellitus   . Hyperlipidemia   . Hypertension     Past Surgical History:  Procedure Laterality Date  . SPINE SURGERY      History reviewed. No pertinent family history.  Social History:  reports that he has been smoking cigarettes. He has a 20.00 pack-year smoking history. He has never used smokeless tobacco. He reports that he does not drink alcohol and does not use drugs.  Allergies:  Allergies  Allergen Reactions  . Lipitor [Atorvastatin Calcium] Other (See Comments)    Muscle pain    Medications: I have reviewed the patient's current medications.  Results for orders placed or performed during the hospital encounter of 07/30/20 (from the past 48 hour(s))  CBG monitoring, ED     Status: Abnormal   Collection Time: 07/31/20  3:41 PM  Result Value Ref Range   Glucose-Capillary 166 (H) 70 - 99 mg/dL    Comment: Glucose reference range applies only to samples taken after fasting for at least 8 hours.  Urinalysis, Routine w reflex microscopic Urine, Clean Catch     Status: None   Collection Time: 07/31/20  3:45 PM  Result Value Ref Range   Color, Urine YELLOW YELLOW   APPearance CLEAR CLEAR   Specific Gravity, Urine 1.011 1.005 - 1.030   pH 5.0 5.0 - 8.0   Glucose, UA NEGATIVE NEGATIVE mg/dL   Hgb urine dipstick NEGATIVE NEGATIVE   Bilirubin Urine NEGATIVE NEGATIVE   Ketones, ur NEGATIVE  NEGATIVE mg/dL   Protein, ur NEGATIVE NEGATIVE mg/dL   Nitrite NEGATIVE NEGATIVE   Leukocytes,Ua NEGATIVE NEGATIVE    Comment: Performed at [facility], [address] [place], [place] [address]  Glucose, capillary     Status: Abnormal   Collection Time: 07/31/20  4:58 PM  Result Value Ref Range   Glucose-Capillary 166 (H) 70 - 99 mg/dL    Comment: Glucose reference range applies only to samples taken after fasting for at least 8 hours.  Glucose, capillary     Status: Abnormal   Collection Time: 07/31/20  8:10 PM  Result Value Ref Range   Glucose-Capillary 240 (H) 70 - 99 mg/dL    Comment: Glucose reference range applies only to samples taken after fasting for at least 8 hours.  Glucose, capillary     Status: Abnormal   Collection Time: 08/01/20 12:01 AM  Result Value Ref Range   Glucose-Capillary 200 (H) 70 - 99 mg/dL    Comment: Glucose reference range applies only to samples taken after fasting for at least 8 hours.  Sedimentation rate     Status: Abnormal   Collection Time: 08/01/20  2:00 AM  Result Value Ref Range   Sed Rate 103 (H) 0 - 16 mm/hr    Comment: Performed at [facility], [address] [place], [place] [address]  C-reactive protein     Status: Abnormal   Collection Time: 08/01/20  2:00 AM  Result Value Ref Range   CRP 9.1 (H) <1.0 mg/dL    Comment: Performed at [facility], [address] [place], [place] [address]  Glucose, capillary     Status: Abnormal   Collection Time: 08/01/20  4:38 AM  Result Value Ref Range   Glucose-Capillary 129 (H) 70 - 99 mg/dL    Comment: Glucose reference range applies only to samples taken after fasting for at least 8 hours.  Glucose, capillary     Status: Abnormal   Collection Time: 08/01/20  8:05 AM  Result Value Ref Range   Glucose-Capillary 135 (H) 70 - 99 mg/dL    Comment: Glucose reference range applies only to samples taken after fasting for at least 8 hours.  Glucose, capillary     Status:  Abnormal   Collection Time: 08/01/20 11:49 AM  Result Value Ref Range   Glucose-Capillary 142 (H) 70 - 99 mg/dL    Comment: Glucose reference range applies only to samples taken after fasting for at least 8 hours.  Glucose, capillary     Status: Abnormal   Collection Time: 08/01/20  4:24 PM  Result Value Ref Range   Glucose-Capillary 157 (H) 70 - 99 mg/dL    Comment: Glucose reference range applies only to samples taken after fasting for at least 8 hours.  CBC Once     Status: Abnormal   Collection Time: 08/02/20  2:59 AM  Result Value Ref Range   WBC 6.7 4.0 - 10.5 K/uL   RBC 3.13 (L) 4.22 - 5.81 MIL/uL   Hemoglobin 9.4 (L) 13.0 - 17.0 g/dL   HCT 85.2 (L) 39 - 52 %   MCV 93.3 80.0 - 100.0 fL   MCH 30.0 26.0 - 34.0 pg   MCHC 32.2 30.0 - 36.0 g/dL   RDW 77.8 24.2 - 35.3 %   Platelets 376 150 - 400 K/uL   nRBC 0.0 0.0 - 0.2 %    Comment: Performed at [facility], [address] [place], [place] [address]  Basic metabolic panel Once     Status: Abnormal   Collection Time: 08/02/20  2:59 AM  Result Value Ref Range   Sodium 140 135 - 145 mmol/L   Potassium 3.9 3.5 - 5.1 mmol/L   Chloride 106 98 - 111 mmol/L   CO2 26 22 - 32 mmol/L   Glucose, Bld 109 (H) 70 - 99 mg/dL    Comment: Glucose reference range applies only to samples taken after fasting for at least 8 hours.   BUN 10 8 - 23 mg/dL   Creatinine, Ser 1.54 0.61 - 1.24 mg/dL   Calcium 8.4 (L) 8.9 - 10.3 mg/dL   GFR calc non Af Amer >60 >60 mL/min   GFR calc Af Amer >60 >60 mL/min   Anion gap 8 5 - 15    Comment: Performed at [facility], [address], [place], [place] [address]    DG Chest 2 View  Result Date: 08/01/2020 CLINICAL DATA:  Cough EXAM: CHEST - 2 VIEW COMPARISON:  07/31/2020 FINDINGS: Mild left lower lobe opacity, atelectasis versus pneumonia. No pleural effusion or pneumothorax. The heart is normal in size. Degenerative changes of the thoracic spine. Cervical spine fixation hardware,  incompletely visualized. IMPRESSION: Mild left lower lobe opacity, atelectasis versus pneumonia. Electronically Signed   By: Charline Bills M.D.   On: 08/01/2020  10:55   MR HIP LEFT W WO CONTRAST  Result Date: 08/01/2020 CLINICAL DATA:  Left hip wound.  Evaluate for osteomyelitis. EXAM: MRI OF THE LEFT HIP WITHOUT AND WITH CONTRAST TECHNIQUE: Multiplanar, multisequence MR imaging was performed both before and after administration of intravenous contrast. CONTRAST:  9mL GADAVIST GADOBUTROL 1 MMOL/ML IV SOLN COMPARISON:  Left hip x-rays from yesterday. CT left hip dated July 15, 2020. FINDINGS: Bones/Joint/Cartilage No marrow signal abnormality. No acute fracture or dislocation. Postsurgical changes of the left posterior acetabulum status post ORIF, with associated susceptibility artifact. The sacroiliac joints are partially ankylosed. Moderate bilateral hip osteoarthritis. No significant joint effusion. Muscles and Tendons Edema in the left anterior proximal upper thigh muscles and gluteus maximus. Enhancement of the left vastus lateralis muscle. Severe atrophy of the left gluteus medius and minimus muscles and left tensor fascia lata muscle. Soft tissue Large soft tissue ulceration along the lateral left hip extending near the left greater trochanter again noted with surrounding soft tissue edema. There is a curvilinear fluid collection along the deep inferior margin of the ulcer measuring approximately 4.1 x 1.3 x 4.4 cm (AP by transverse by CC) (series 31, image 13; series 30, image 24). Sigmoid diverticulosis.  Small left lateral bladder diverticulum. IMPRESSION: 1. Large soft tissue ulceration along the lateral left hip extending near the left greater trochanter with surrounding cellulitis and vastus lateralis myositis. There is a curvilinear 4.4 cm fluid collection along the deep inferior margin of the ulcer, consistent with abscess. 2. No osteomyelitis or septic arthritis. Electronically Signed   By:  Obie Dredge M.D.   On: 08/01/2020 16:58    Review of Systems  Constitutional: Negative for chills, diaphoresis and fever.  HENT: Negative for ear discharge, ear pain, hearing loss and tinnitus.   Eyes: Negative for photophobia and pain.  Respiratory: Negative for cough and shortness of breath.   Cardiovascular: Negative for chest pain.  Gastrointestinal: Negative for abdominal pain, nausea and vomiting.  Genitourinary: Negative for dysuria, flank pain, frequency and urgency.  Musculoskeletal: Positive for arthralgias (Left hip). Negative for back pain, myalgias and neck pain.  Neurological: Negative for dizziness and headaches.  Hematological: Does not bruise/bleed easily.  Psychiatric/Behavioral: The patient is not nervous/anxious.    Blood pressure (!) 159/76, pulse 96, temperature (!) 97.5 F (36.4 C), temperature source Oral, resp. rate 18, height 5\' 10"  (1.778 m), weight 99.8 kg, SpO2 99 %. Physical Exam Constitutional:      General: He is not in acute distress.    Appearance: He is well-developed. He is not diaphoretic.  HENT:     Head: Normocephalic and atraumatic.  Eyes:     General: No scleral icterus.       Right eye: No discharge.        Left eye: No discharge.     Conjunctiva/sclera: Conjunctivae normal.  Cardiovascular:     Rate and Rhythm: Normal rate and regular rhythm.  Pulmonary:     Effort: Pulmonary effort is normal. No respiratory distress.  Musculoskeletal:     Cervical back: Normal range of motion.     Comments: LLE No traumatic wounds, ecchymosis, or rash  Wound over greater troch with purulent discharge, packing in place, no odor, mild TTP  No knee or ankle effusion  Knee stable to varus/ valgus and anterior/posterior stress  Sens DPN, SPN, TN intact  Motor EHL, ext, flex, evers 4/5  DP 1+, PT 1+, No significant edema  Skin:    General: Skin is  warm and dry.  Neurological:     Mental Status: He is alert.  Psychiatric:        Behavior:  Behavior normal.     Assessment/Plan: Left hip infection -- At this point would suggest IR drainage of abscess for C&S even though it seems contiguous with draining wound. Would also consider ID involvement. Do not think operative I&D indicated as yet. Multiple medical problems including DM, HTN, HLD, and chronic pain syndrome -- per primary service    Freeman Caldron, PA-C Orthopedic Surgery [phone] 08/02/2020, 2:39 PM

## 2020-08-02 NOTE — Progress Notes (Signed)
Subjective:  Hospital Day: 3  No overnight events.  Mr. Wengert was evaluated at bedside this morning. He notes that his abdominal tension and pain have improved. He had a bowel movement yesterday after receiving Miralax and has felt much better since then. He denies any nausea or vomiting.  He reports his hip and back continue to hurt but are unchanged from yesterday. He also notes that his shortness of breath is about the same this morning but that his cough is slightly better and producing less sputum.  After starting baclofen, his full body spacticity and cramps have improved.  We discussed the results of his MRI with him and that an orthopedic surgeon would come to see him about the abscess in his left leg. He noted that when his bandage was changed this morning, he saw pus coming from the wound. Mr. Bartolomei was left lying comfortably in bed.   Objective: Vital signs in last 24 hours: Vitals:   08/01/20 0412 08/01/20 1708 08/01/20 2034 08/02/20 0514  BP: 126/70 (!) 159/82 (!) 165/81 (!) 159/76  Pulse: 98 93 97 96  Resp: '18 19 17 18  '$ Temp: 98 F (36.7 C) 98.3 F (36.8 C) 98 F (36.7 C) (!) 97.5 F (36.4 C)  TempSrc: Oral Oral Oral Oral  SpO2: 100% 100% 96% 99%  Weight:      Height:       Weight change:   Intake/Output Summary (Last 24 hours) at 08/02/2020 1149 Last data filed at 08/02/2020 0900 Gross per 24 hour  Intake 686.67 ml  Output 800 ml  Net -113.33 ml   Physical Exam Vitals and nursing note reviewed.  Constitutional: no acute distress, lying comfortably in bed Cardiovascular: regular rate and rhythm, normal heart sounds Pulmonary: effort normal, rales noted left lower lung field Abdominal: flat, nontender, no rebound tenderness, bowel sounds normal Musculoskeletal:no peripheral edema Skin: warm and dry, wound covered in new bandage Neurological: alert, no focal deficit Psychiatric: normal mood and affect  Lab Results:  CBC Latest Ref Rng & Units  08/02/2020 07/31/2020 07/30/2020  WBC 4.0 - 10.5 K/uL 6.7 15.5(H) 11.0(H)  Hemoglobin 13.0 - 17.0 g/dL 9.4(L) 9.8(L) 9.9(L)  Hematocrit 39 - 52 % 29.2(L) 31.4(L) 30.6(L)  Platelets 150 - 400 K/uL 376 389 410(H)   CMP Latest Ref Rng & Units 08/02/2020 07/31/2020 07/30/2020  Glucose 70 - 99 mg/dL 109(H) 86 108(H)  BUN 8 - 23 mg/dL '10 21 21  '$ Creatinine 0.61 - 1.24 mg/dL 0.86 0.92 0.98  Sodium 135 - 145 mmol/L 140 136 134(L)  Potassium 3.5 - 5.1 mmol/L 3.9 3.9 3.6  Chloride 98 - 111 mmol/L 106 100 99  CO2 22 - 32 mmol/L '26 24 24  '$ Calcium 8.9 - 10.3 mg/dL 8.4(L) 8.5(L) 8.4(L)  Total Protein 6.5 - 8.1 g/dL - 6.4(L) 6.0(L)  Total Bilirubin 0.3 - 1.2 mg/dL - 0.6 0.5  Alkaline Phos 38 - 126 U/L - 82 78  AST 15 - 41 U/L - 23 19  ALT 0 - 44 U/L - 19 18   Studies/Results: MR HIP LEFT W WO CONTRAST IMPRESSION: 1. Large soft tissue ulceration along the lateral left hip extending near the left greater trochanter with surrounding cellulitis and vastus lateralis myositis. There is a curvilinear 4.4 cm fluid collection along the deep inferior margin of the ulcer, consistent with abscess. 2. No osteomyelitis or septic arthritis. Electronically Signed   By: Titus Dubin M.D.   On: 08/01/2020 16:58   Assessment/Plan: Principal Problem:  Hypoglycemia Active Problems:   Purulent Cellulitis of left hip   Chronic pain  Mr. Dafoe is a 64 year old male with a PMHx of well controlled DM, HTN, and HLD who presented to the ED from [facility] for AMS in the setting of hypoglycemia.Stable, normal blood sugars and mental status since admission. Infection suspected cause of hypoglycemic episode.  Symptomatic Hypoglycemia Admitted for confusion, profuse sweating, and lethargy with several low CBG levels, 39-49 that did not respond to glucagon administration. Mr. Armistead has required no further IV glucose since day 1. He has been eating well and maintaining glucose levels above 100 since his admission.  Given two possible sources of infection at this time, infection is the most likely cause of his hypoglycemia and AMS. He is currently being treated for CAP found on x-ray after his complaints of productive cough and shortness of breath. Left hip MRI indicates abscess in the soft tissue of his left leg without osteomyelitis. -continue regular diet -PO intake improved today with stable and normal blood glucose -f/ublood cultures x 2 - CheckBMP in AM  Cellulitis of the Left Hip Elevated CRP to 9.1 and ESR to 103 with history of MSSA cellulitis, worsening left hip pain, fever, and chills. 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 Orthopedics consulted and recommend IR drainage for culture and sens. rather than surgical intervention. Broaden antibiotics at that time if needed for MRSA coverage. - Consulted wound care for dressing changes - consulted ortho, appreciate recs - consult IR for abscess drainage  Community Acquired Pneumonia  Patient endorses SOB and cough x 3 days with intermittent fevers and chills. Cough improving today with less sputum production. Rales still present left lower lung field on exam.2-view CXR performed yesterday confirmed left lower lobe consolidation.Leukocytosis improved to 6.7 from 15.5 two days ago. Will continue tx for CAP. - Continue Rocephin 1g, Day 3/4 - Azithromycin '250mg'$ , Day 2/4  Spasticity Improved significantly since restarting his baclofen. -cont baclofen 5 mg BID  Constipation Improved abdominal tenderness and pressure since bowel movement yesterday. Continue PRN bowel regimen. - Miralax 17g packet daily PRN  Polyuria Unchanged today compared to yesterday. Likely in part due to supine position during urination. Denies pain, hematuria, or difficulty initiating urine stream. No further work-up inpatient at this time.  Well-Controlled Type II DM Hgb A1c 07/15/20 was 6.9. Patient and nursing  staff state he has not been taking any glucose-lowering medications. Hypoglycemic episode likely due to acute infection.  Stable Normocytic Anemia Hemoglobin 9.9, MCV 95.3. Iron studies 07/15/20 showed low iron 37, low TIBC 199, saturation ratios 19 and elevated ferritin 343, consistent with either acute IDA or IDA in the setting of anemia of chronic disease.No hematuria noted on UA. - Monitor CBCs  Chronic Pain Syndrome  Patient takes Oxycodone '10mg'$  q6hrs and Baclofen '5mg'$  BID with Miralax.Pt no longer complaining of right foot pain. - Start Oxycodone '5mg'$  q6hrsprn - Continue Miralax 17g packet daily PRN  This is a Careers information officer Note.  The care of the patient was discussed with Drs. Amponsah and Coe and the assessment and plan formulated with their assistance.   LOS: 2 days   Calvert Cantor, Medical Student 08/02/2020, 11:49 AM

## 2020-08-02 NOTE — TOC Progression Note (Signed)
Transition of Care [facility]) - Progression Note    Patient Details  Name: [name] MRN: [id number] Date of Birth: Jan 18, 1956  Transition of Care [facility]) CM/SW Contact  Doy [place], [place] Phone Number: 08/02/2020, 1:35 PM  Clinical Narrative:    Provided pt with SNF offers, he would like to discuss them with his brother, he understands [facility] is not offering placement at this time. TOC team to f/u with choice needed for prior auth. Pt has not been vaccinated against COVID, I offered for our pharmacy team to talk to him about J&J vaccine being offered for facility pts. He expresses multiple reasons why he doesn't want this- will also alert pt MD team that we can offer this to see if they can answer any questions around hesitancy. TOC team continues to follow.    Expected Discharge Plan: Skilled Nursing Facility Barriers to Discharge: Continued Medical Work up, English as a second language teacher  Expected Discharge Plan and Services Expected Discharge Plan: Skilled Nursing Facility In-house Referral: Clinical Social Work Discharge Planning Services: CM Consult Living arrangements for the past 2 months: Single Family Home, Skilled Nursing Facility  Readmission Risk Interventions Readmission Risk Prevention Plan 08/01/2020  Post Dischage Appt Complete  Medication Screening Complete  Transportation Screening Complete  Some recent data might be hidden

## 2020-08-02 NOTE — Progress Notes (Signed)
Did dressing change with 1/4 " iodoform gauze (used about 1/2 of the container), it is 2.8 cm deep, and there is a tract anterior/upper of the opening of the wound. Cleansed area and used skin prep, fluffed gauze and placed 2 ABDs over the site as there is a large amount of tan drainage coming from the wound.

## 2020-08-03 ENCOUNTER — Inpatient Hospital Stay (HOSPITAL_COMMUNITY): Payer: Medicare Other

## 2020-08-03 ENCOUNTER — Inpatient Hospital Stay: Payer: Medicare Other

## 2020-08-03 DIAGNOSIS — Z23 Encounter for immunization: Secondary | ICD-10-CM

## 2020-08-03 LAB — CULTURE, BLOOD (ROUTINE X 2)

## 2020-08-03 LAB — GLUCOSE, CAPILLARY
Glucose-Capillary: 97 mg/dL (ref 70–99)
Glucose-Capillary: 84 mg/dL (ref 70–99)
Glucose-Capillary: 99 mg/dL (ref 70–99)

## 2020-08-03 MED ORDER — LIDOCAINE HCL (PF) 1 % IJ SOLN
INTRAMUSCULAR | Status: AC
  Filled 2020-08-03: qty 30

## 2020-08-03 MED ORDER — FENTANYL CITRATE (PF) 100 MCG/2ML IJ SOLN
INTRAMUSCULAR | Status: AC | PRN
  Administered 2020-08-03 (×2): 25 ug via INTRAVENOUS

## 2020-08-03 MED ORDER — FENTANYL CITRATE (PF) 100 MCG/2ML IJ SOLN
INTRAMUSCULAR | Status: AC
  Filled 2020-08-03: qty 2

## 2020-08-03 MED ORDER — POLYETHYLENE GLYCOL 3350 17 G PO PACK: 17.0000 g | PACK | Freq: Every day | ORAL | Status: DC | PRN

## 2020-08-03 MED ORDER — MIDAZOLAM HCL 2 MG/2ML IJ SOLN
INTRAMUSCULAR | Status: AC | PRN
  Administered 2020-08-03: 1 mg via INTRAVENOUS

## 2020-08-03 MED ORDER — MIDAZOLAM HCL 2 MG/2ML IJ SOLN
INTRAMUSCULAR | Status: AC
  Filled 2020-08-03: qty 2

## 2020-08-03 MED ORDER — SODIUM CHLORIDE 4 MEQ/ML IV SOLN
INTRAVENOUS | Status: DC
  Filled 2020-08-03: qty 1000

## 2020-08-03 NOTE — TOC Progression Note (Addendum)
Transition of Care [facility]) - Progression Note    Patient Details  Name: [name] MRN: [id number] Date of Birth: 1956-10-12  Transition of Care [facility]) CM/SW Contact  Doy Hut[place], [place] Phone Number: 08/03/2020, 2:32 PM  Clinical Narrative:    Hard fax referral sent to [facility] admissions coverage [place]. Aware pt had COVID vaccine today (as requested by Dr. Kirke Corin after speaking w/ pt). Auth started through [facility] Medicare- ref #[id number]    Expected Discharge Plan: Skilled Nursing Facility Barriers to Discharge: Continued Medical Work up, English as a second language teacher  Expected Discharge Plan and Services Expected Discharge Plan: Skilled Nursing Facility In-house Referral: Clinical Social Work Discharge Planning Services: CM Consult Living arrangements for the past 2 months: Single Family Home, Skilled Nursing Facility  Readmission Risk Interventions Readmission Risk Prevention Plan 08/01/2020  Post Dischage Appt Complete  Medication Screening Complete  Transportation Screening Complete  Some recent data might be hidden

## 2020-08-03 NOTE — Progress Notes (Signed)
Subjective:   Otho evaluated patient and did not think it required operative I&D. Recommended putting him on doxy 100 mg BID and referral to the wound center.   Patient seems in much better spirits today. Miralax has been effective, abdomen feeling much better. Looking forward to getting out of the hospital and to rehab at [facility]. Reports physical therapy got him out of bed yesterday and he sat in the chair "for a while."   Objective:  Vital signs in last 24 hours: Vitals:   08/02/20 0514 08/02/20 1653 08/02/20 2014 08/03/20 0400  BP: (!) 159/76 137/68 135/67 (!) 155/73  Pulse: 96 88 87 92  Resp: '18 20 18 19  '$ Temp: (!) 97.5 F (36.4 C) 97.8 F (36.6 C) 98 F (36.7 C) 98.3 F (36.8 C)  TempSrc: Oral Oral Oral Oral  SpO2: 99% 99% 99% 99%  Weight:      Height:       General: Pleasant man laying comfortably in bed. No acute distress. [place]/AT: /AT Cardiovascular: RRR. No m/r/g. No LE edema Pulmonary: Lungs CTAB. No increased WOB Abdominal: Soft, non-tender. Non-distended. Normal bowel sounds. Musculoskeletal:Normal ROM Skin: Warm and dry, wound covered in new bandage Neurological: A&Ox3. Moves all extremities. Normal sensation Psychiatric: Normal mood and affect  Assessment/Plan:  Principal Problem:   Hypoglycemia Active Problems:   Purulent Cellulitis of left hip   Chronic pain  Mr.Carrender is a 64 year old male with a PMHx of well controlled DM, HTN, and HLD who presented to the ED from [facility] for AMS in the setting of hypoglycemia.Stable, normalblood sugars and mental status since admission. Infection suspected cause of hypoglycemic episode.  Symptomatic Hypoglycemia Admitted for confusion, profuse sweating, and lethargy with several low CBG levels, 39-49 that did not respond to glucagon administration.Mr. Scorsone has required no further IV glucosesince day 1. He has been eating well and maintaining glucose levels above 100 since his admission.  Given two possible sources of infection at this time, infection is the most likely cause of his hypoglycemia and AMS. He is currently being treated for CAP found on x-ray after his complaints of productive cough and shortness of breath. Left hip MRI indicates abscess in the soft tissue of his left leg without osteomyelitis.  CBG stable.  No signs of hypoglycemia. --Continue regular diet --Daily CBGs  Cellulitis of the Left Hip Elevated CRP to 9.1 and ESR to 103 with history of MSSA cellulitis, worsening left hip pain, fever, and chills. 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 Orthopedics consulted and recommend IR drainage for culture and sens. rather than surgical intervention.  Planned catheter insertion and drainage of abscess by IR today. --Consulted wound care for dressing changes, appreciate their assistance --Consulted ortho, appreciate recs --Follow-up abscess drainage by IR -Follow-up wound aerobic/anaerobic culture  Community Acquired Pneumonia  Patient endorses SOB and cough x 3 days with intermittent fevers and chills. Cough improving today with less sputum production. Rales still present left lower lung field on exam.2-view CXR performed yesterday confirmed left lower lobe consolidation.Leukocytosis improved to 6.7 from 15.5 three days ago.Will continue tx for CAP. --Continue Rocephin 1g, Day 4/4 --On azithromycin '250mg'$ , Day 3/4  Spasticity Improved significantly since restarting his baclofen. --Cont baclofen 5 mg BID  Constipation Improved abdominal tenderness and pressure since he started using the MiraLAX. Continue PRN bowel regimen.  Denies any abdominal pain today. --Miralax 17g packet daily PRN  Polyuria Unchanged today compared to yesterday.  Likely in part due to supine position during urination. Denies pain, hematuria, or difficulty initiating urine stream. No further work-up inpatient at this  time.  Well-Controlled Type II DM Hgb A1c 07/15/20 was 6.9. Patient and nursing staff state he has not been taking any glucose-lowering medications.Hypoglycemic episode likely due to acute infection.  Stable Normocytic Anemia Hemoglobin 9.4, MCV 93.3. Iron studies 07/15/20 showed low iron 37, low TIBC 199, saturation ratios 19 and elevated ferritin 343, consistent with either acute IDA or IDA in the setting of anemia of chronic disease.No hematuria noted on UA. --Monitor CBCs  Chronic Pain Syndrome  Patient takes Oxycodone '10mg'$  q6hrs and Baclofen '5mg'$  BID with Miralax.Pt no longer complaining of right foot pain. --Start Oxycodone '5mg'$  q6hrsprn --Continue Miralax 17g packet daily PRN  Prior to Admission Living Arrangement: SNF Anticipated Discharge Location: SNF Barriers to Discharge: Pending SNF placement Dispo: Anticipated discharge in approximately 1-2 day(s).   Lacinda Axon, MD 08/03/2020, 7:21 AM Pager: [phone] Internal Medicine Teaching Service After 5pm on weekdays and 1pm on weekends: On Call pager [phone]

## 2020-08-03 NOTE — Consult Note (Signed)
Chief Complaint: Left hip wound. Request is for aspiration of left hip  Referring Physician(s): Dr. Wilfred Lacy  Supervising Physician: Irish Lack  Patient Status: [facility] - In-pt  History of Present Illness: [name] is a 64 y.o. male 64 y.o, male inpatient. History of DM, HTN, HLD, .Admitted for AMS associated with hypoglycemia and ongoing cellulitis of the left hip X 2 -3 months. Patient reports inially  following on to concrete and injury his left hip. Patient was admitted in July for MSSA infection the area and discharged on doxycycline. Patient has a MR Hip form 8.11.21 that reads Large soft tissue ulceration along the lateral left hip extending near the left greater trochanter with surrounding cellulitis and vastus lateralis myositis. There is a curvilinear 4.4 cm fluid collection along the deep inferior margin of the ulcer, consistent with abscess. No osteomyelitis or septic arthritis. Team is requesting aspiration of left hip wound for further evaluation and treatment planning.    Past Medical History:  Diagnosis Date  . Cellulitis 07/17/2020   LEFT HIP  . Diabetes mellitus   . Hyperlipidemia   . Hypertension     Past Surgical History:  Procedure Laterality Date  . SPINE SURGERY      Allergies: Lipitor [atorvastatin calcium]  Medications: Prior to Admission medications   Medication Sig Start Date End Date Taking? Authorizing Provider  acetaminophen (TYLENOL) 325 MG tablet Take 2 tablets (650 mg total) by mouth every 6 (six) hours as needed. 07/20/20 08/19/20 Yes Doran Stabler, DO  Amino Acids-Protein Hydrolys (FEEDING SUPPLEMENT, PRO-STAT SUGAR FREE 64,) LIQD Take 30 mLs by mouth 3 (three) times daily with meals.   Yes [provider]  baclofen (LIORESAL) 5 mg TABS tablet Take 5 mg by mouth 2 (two) times daily.   Yes [provider]  cholecalciferol (VITAMIN D) 25 MCG tablet Take 1 tablet (1,000 Units total) by mouth daily. 07/20/20 08/19/20  Yes Doran Stabler, DO  gabapentin (NEURONTIN) 400 MG capsule Take 1 capsule (400 mg total) by mouth 3 (three) times daily. 07/20/20 08/19/20 Yes Doran Stabler, DO  ibuprofen (ADVIL) 200 MG tablet Take 400 mg by mouth once.   Yes [provider]  oxyCODONE (OXY IR/ROXICODONE) 5 MG immediate release tablet Take 2 tablets (10 mg total) by mouth every 6 (six) hours as needed for moderate pain or severe pain. 07/19/20 08/18/20 Yes Doran Stabler, DO  polyethylene glycol (MIRALAX / GLYCOLAX) 17 g packet Take 17 g by mouth 2 (two) times daily for 14 days. 07/20/20 08/03/20 Yes Doran Stabler, DO     History reviewed. No pertinent family history.  Social History   Socioeconomic History  . Marital status: Single    Spouse name: Not on file  . Number of children: Not on file  . Years of education: Not on file  . Highest education level: Not on file  Occupational History  . Not on file  Tobacco Use  . Smoking status: Current Every Day Smoker    Packs/day: 0.50    Years: 40.00    Pack years: 20.00    Types: Cigarettes  . Smokeless tobacco: Never Used  Vaping Use  . Vaping Use: Never used  Substance and Sexual Activity  . Alcohol use: No  . Drug use: No  . Sexual activity: Not on file  Other Topics Concern  . Not on file  Social History Narrative  . Not on file   Social Determinants of Health   Financial Resource Strain:   .  Difficulty of Paying Living Expenses:   Food Insecurity:   . Worried About Programme researcher, broadcasting/film/videounning Out of Food in the Last Year:   . Baristaan Out of Food in the Last Year:   Transportation Needs:   . Freight forwarderLack of Transportation (Medical):   Marland Kitchen. Lack of Transportation (Non-Medical):   Physical Activity:   . Days of Exercise per Week:   . Minutes of Exercise per Session:   Stress:   . Feeling of Stress :   Social Connections:   . Frequency of Communication with Friends and Family:   . Frequency of Social Gatherings with Friends and Family:   . Attends Religious Services:   . Active  Member of Clubs or Organizations:   . Attends BankerClub or Organization Meetings:   Marland Kitchen. Marital Status:     Review of Systems: A 12 point ROS discussed and pertinent positives are indicated in the HPI above.  All other systems are negative.  Review of Systems  Constitutional: Positive for fatigue. Negative for fever.  HENT: Negative for congestion.   Respiratory: Negative for cough and shortness of breath.   Cardiovascular: Negative for chest pain.  Gastrointestinal: Negative for abdominal pain.  Musculoskeletal: Positive for arthralgias ( left hip pain) and back pain.  Neurological: Negative for headaches.  Psychiatric/Behavioral: Negative for behavioral problems and confusion.    Vital Signs: BP (!) 155/73 (BP Location: Right Arm)   Pulse 92   Temp 98.3 F (36.8 C) (Oral)   Resp 19   Ht 5\' 10"  (1.778 m)   Wt 220 lb (99.8 kg)   SpO2 99%   BMI 31.57 kg/m   Physical Exam Vitals and nursing note reviewed.  Constitutional:      Appearance: He is well-developed.  HENT:     Head: Normocephalic.  Cardiovascular:     Rate and Rhythm: Normal rate and regular rhythm.     Heart sounds: Normal heart sounds.  Pulmonary:     Effort: Pulmonary effort is normal.     Breath sounds: Normal breath sounds.     Comments: Non productive cough noted.  Musculoskeletal:        General: Normal range of motion.     Cervical back: Normal range of motion.  Skin:    General: Skin is dry.  Neurological:     Mental Status: He is alert and oriented to person, place, and time.     Imaging: DG Chest 1 View  Result Date: 07/31/2020 CLINICAL DATA:  Altered mental status.  Found unresponsive. EXAM: CHEST  1 VIEW COMPARISON:  One-view chest x-ray 08/08/2008. FINDINGS: Heart is enlarged. Mild pulmonary vascular congestion is present. Left greater than right lower lobe airspace opacities are present. Axial skeleton is unremarkable. IMPRESSION: 1. Cardiomegaly and mild pulmonary vascular congestion. 2. Left  greater than right lower lobe airspace disease, concerning for pneumonia. Electronically Signed   By: Marin Robertshristopher  Mattern M.D.   On: 07/31/2020 04:34   DG Chest 2 View  Result Date: 08/01/2020 CLINICAL DATA:  Cough EXAM: CHEST - 2 VIEW COMPARISON:  07/31/2020 FINDINGS: Mild left lower lobe opacity, atelectasis versus pneumonia. No pleural effusion or pneumothorax. The heart is normal in size. Degenerative changes of the thoracic spine. Cervical spine fixation hardware, incompletely visualized. IMPRESSION: Mild left lower lobe opacity, atelectasis versus pneumonia. Electronically Signed   By: Charline BillsSriyesh  Krishnan M.D.   On: 08/01/2020 10:55   DG Lumbar Spine 2-3 Views  Result Date: 07/31/2020 CLINICAL DATA:  Recent fall with low back pain, initial encounter  EXAM: LUMBAR SPINE - 2-3 VIEW COMPARISON:  07/16/2020 FINDINGS: Prior Greenfield filter is noted in satisfactory position. Postsurgical changes in the left acetabulum are noted. Multilevel osteophytic changes are seen. No compression deformity is noted. No anterolisthesis is noted. IMPRESSION: Multilevel degenerative change similar to that seen on the prior exam. No acute abnormality noted. Electronically Signed   By: Alcide Clever M.D.   On: 07/31/2020 11:12   CT HIP LEFT W CONTRAST  Result Date: 07/15/2020 CLINICAL DATA:  Left-sided hip pain for 3 months. History of acetabular reconstruction. Soft tissue infection suspected. EXAM: CT OF THE LOWER LEFT EXTREMITY WITH CONTRAST TECHNIQUE: Multidetector CT imaging of the left hip was performed according to the standard protocol following intravenous contrast administration. COMPARISON:  Radiographs 07/15/2020 and 06/12/2008. CONTRAST:  OMNIPAQUE IOHEXOL 300 MG/ML  SOLN FINDINGS: Bones/Joint/Cartilage Status post remote posterior plate and screw left acetabular reconstruction, present on the 2009 radiographs. The hardware appears intact without loosening. There is posttraumatic deformity of the  posterior wall and column of the left acetabulum with mild articular surface irregularity and secondary left hip degenerative changes. There is fragmented heterotopic ossification adjacent to the greater trochanter. There is a small left hip joint effusion with probable intra-articular loose bodies. No evidence of acute fracture, dislocation, femoral head avascular necrosis or acute osteomyelitis. 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 There is moderate lower lumbar spondylosis. Ligaments Suboptimally assessed by CT. Muscles and Tendons Marked atrophy of the left gluteus medius and minimus muscles and the tensor fascia lata muscle. Soft tissues There is a large area of soft tissue ulceration lateral to the left greater trochanter with air extending approximately 7 cm deep to the skin surface. This air nearly abuts the greater trochanter. There are inflammatory changes surrounding this open wound with muscular thickening and an ill-defined fluid collection inferiorly, measuring up to 3.5 cm on sagittal image 103/7. No drainable collection identified. No intrapelvic inflammatory changes are seen. There is iliofemoral atherosclerosis. IMPRESSION: 1. Large area of soft tissue ulceration lateral to the left greater trochanter with air extending approximately 7 cm deep to the skin surface, consistent with cellulitis. Small ill-defined soft tissue fluid collection inferior to the ulcer, but no drainable collection identified. 2. No evidence of osteomyelitis or septic joint. 3. 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 Small left hip joint effusion with probable intra-articular loose bodies. 4. No evidence of acute fracture. 5. Marked chronic muscular atrophy as described. Electronically Signed   By: Carey Bullocks M.D.   On: 07/15/2020 10:46   MR HIP LEFT W WO CONTRAST  Result Date: 08/01/2020 CLINICAL DATA:  Left hip wound.   Evaluate for osteomyelitis. EXAM: MRI OF THE LEFT HIP WITHOUT AND WITH CONTRAST TECHNIQUE: Multiplanar, multisequence MR imaging was performed both before and after administration of intravenous contrast. CONTRAST:  10mL GADAVIST GADOBUTROL 1 MMOL/ML IV SOLN COMPARISON:  Left hip x-rays from yesterday. CT left hip dated July 15, 2020. FINDINGS: Bones/Joint/Cartilage No marrow signal abnormality. No acute fracture or dislocation. Postsurgical changes of the left posterior acetabulum status post ORIF, with associated susceptibility artifact. The sacroiliac joints are partially ankylosed. Moderate bilateral hip osteoarthritis. No significant joint effusion. Muscles and Tendons Edema in the left anterior proximal upper thigh muscles and gluteus maximus. Enhancement of the left vastus lateralis muscle. Severe atrophy of the left gluteus medius and minimus muscles and left tensor fascia lata muscle. Soft tissue Large soft tissue ulceration along  the lateral left hip extending near the left greater trochanter again noted with surrounding soft tissue edema. There is a curvilinear fluid collection along the deep inferior margin of the ulcer measuring approximately 4.1 x 1.3 x 4.4 cm (AP by transverse by CC) (series 31, image 13; series 30, image 24). Sigmoid diverticulosis.  Small left lateral bladder diverticulum. IMPRESSION: 1. Large soft tissue ulceration along the lateral left hip extending near the left greater trochanter with surrounding cellulitis and vastus lateralis myositis. There is a curvilinear 4.4 cm fluid collection along the deep inferior margin of the ulcer, consistent with abscess. 2. No osteomyelitis or septic arthritis. Electronically Signed   By: Obie Dredge M.D.   On: 08/01/2020 16:58   DG Lumbar Spine 1 View  Result Date: 07/16/2020 CLINICAL DATA:  Low back pain EXAM: LUMBAR SPINE - 1 VIEW COMPARISON:  12/24/2009 FINDINGS: No gross fracture or subluxation. Very limited visualization due to  overlapping artifact and underpenetration. There is generalized spondylitic spurring which has progressed from prior. Prior pelvic fixation. IMPRESSION: 1. Very limited study without gross fracture or subluxation. 2. Spondylosis which has progressed from a 2011 comparison. Electronically Signed   By: Marnee Spring M.D.   On: 07/16/2020 06:59   US RENAL  Result Date: 07/16/2020 CLINICAL DATA:  Microscopic hematuria for 1 day. EXAM: RENAL / URINARY TRACT ULTRASOUND COMPLETE COMPARISON:  CT AP 05/07/2004 FINDINGS: Right Kidney: Renal measurements: 11.1 x 4.3 x 5.1 cm = volume: 127.3 mL . Echogenicity within normal limits. No mass or hydronephrosis visualized. Simple appearing anechoic cyst within upper pole of right kidney measures 1.7 x 1.9 x 1.3 cm. Left Kidney: Renal measurements: 10.7 x 5.1 x 5.2 cm = volume: 147.4 mL. Echogenicity within normal limits. No mass or hydronephrosis visualized. Bladder: Appears normal for degree of bladder distention. Other: None IMPRESSION: No findings by ultrasound to explain patient's hematuria. Electronically Signed   By: Signa Kell M.D.   On: 07/16/2020 09:40   DG Knee Left Port  Result Date: 07/15/2020 CLINICAL DATA:  Left lower extremity pain EXAM: PORTABLE LEFT KNEE - 1-2 VIEW COMPARISON:  None. FINDINGS: No evidence of fracture, dislocation, or joint effusion. No evidence of arthropathy or other focal bone abnormality. Soft tissues are unremarkable. IMPRESSION: Negative. Electronically Signed   By: Deatra Robinson M.D.   On: 07/15/2020 06:35   DG Ankle Left Port  Result Date: 07/15/2020 CLINICAL DATA:  Left lower extremity pain EXAM: PORTABLE LEFT ANKLE - 2 VIEW COMPARISON:  None. FINDINGS: There is no evidence of fracture, dislocation, or joint effusion. There is no evidence of arthropathy or other focal bone abnormality. Soft tissues are unremarkable. IMPRESSION: Negative. Electronically Signed   By: Deatra Robinson M.D.   On: 07/15/2020 06:35   DG HIP PORT  UNILAT WITH PELVIS 1V LEFT  Result Date: 07/31/2020 CLINICAL DATA:  Fall 3 days ago with left hip pain, initial encounter EXAM: DG HIP (WITH OR WITHOUT PELVIS) 3V LEFT COMPARISON:  07/15/2020 FINDINGS: Postsurgical changes are noted in the posterior aspect of the acetabulum on the left. Degenerative changes of left hip joint are noted. Dystrophic calcification about the left hip is noted. Degenerative changes in the lumbar spine and right hip joint are noted as well. No acute fracture is seen. Soft tissue air is noted consistent with a previous soft tissue wound although the degree air is less than that seen on the prior exam. IMPRESSION: Degenerative and postoperative changes. No acute fracture noted. Soft tissue air consistent with  localized infection. Electronically Signed   By: Alcide Clever M.D.   On: 07/31/2020 11:11   DG Hip Port Unilat W or Wo Pelvis 1 View Left  Result Date: 07/15/2020 CLINICAL DATA:  Left hip wound EXAM: DG HIP (WITH OR WITHOUT PELVIS) 1V PORT LEFT COMPARISON:  None. FINDINGS: There is a drainage tract at the lateral soft tissues of the left hip. No retained foreign body. There is screw and plate fixation hardware along the left iliac bone. Advanced left hip degenerative change. No evidence of active infectious osteitis. IMPRESSION: Drainage tract at the lateral soft tissues of the left hip. No retained foreign body. Electronically Signed   By: Deatra Robinson M.D.   On: 07/15/2020 06:31   VAS [place] LOWER EXTREMITY VENOUS (DVT)  Result Date: 07/16/2020  Lower Venous DVTStudy Indications: Swelling, and Pain.  Comparison Study: no prior Performing Technologist: Blanch Media RVS  Examination Guidelines: A complete evaluation includes B-mode imaging, spectral Doppler, color Doppler, and power Doppler as needed of all accessible portions of each vessel. Bilateral testing is considered an integral part of a complete examination. Limited examinations for reoccurring indications may be  performed as noted. The reflux portion of the exam is performed with the patient in reverse Trendelenburg.  +-----+---------------+---------+-----------+----------+--------------+ RIGHTCompressibilityPhasicitySpontaneityPropertiesThrombus Aging +-----+---------------+---------+-----------+----------+--------------+ CFV  Full           Yes      Yes                                 +-----+---------------+---------+-----------+----------+--------------+   +---------+---------------+---------+-----------+----------+--------------+ LEFT     CompressibilityPhasicitySpontaneityPropertiesThrombus Aging +---------+---------------+---------+-----------+----------+--------------+ CFV      Full           Yes      Yes                                 +---------+---------------+---------+-----------+----------+--------------+ SFJ      Full                                                        +---------+---------------+---------+-----------+----------+--------------+ FV Prox  Full                                                        +---------+---------------+---------+-----------+----------+--------------+ FV Mid   Full                                                        +---------+---------------+---------+-----------+----------+--------------+ FV DistalFull                                                        +---------+---------------+---------+-----------+----------+--------------+ PFV      Full                                                        +---------+---------------+---------+-----------+----------+--------------+  POP      Full           Yes      Yes                                 +---------+---------------+---------+-----------+----------+--------------+ PTV      Full                                                        +---------+---------------+---------+-----------+----------+--------------+ PERO     Full                                                         +---------+---------------+---------+-----------+----------+--------------+     Summary: RIGHT: - No evidence of common femoral vein obstruction.  LEFT: - There is no evidence of deep vein thrombosis in the lower extremity.  - No cystic structure found in the popliteal fossa.  *See table(s) above for measurements and observations. Electronically signed by Fabienne Bruns MD on 07/16/2020 at 7:32:55 PM.    Final     Labs:  CBC: Recent Labs    07/20/20 1005 07/30/20 2353 07/31/20 0739 08/02/20 0259  WBC 8.5 11.0* 15.5* 6.7  HGB 11.3* 9.9* 9.8* 9.4*  HCT 34.4* 30.6* 31.4* 29.2*  PLT 307 410* 389 376    COAGS: No results for input(s): INR, APTT in the last 8760 hours.  BMP: Recent Labs    07/20/20 1005 07/30/20 2353 07/31/20 0421 08/02/20 0259  NA 137 134* 136 140  K 3.9 3.6 3.9 3.9  CL 107 99 100 106  CO2 22 24 24 26   GLUCOSE 139* 108* 86 109*  BUN 13 21 21 10   CALCIUM 8.3* 8.4* 8.5* 8.4*  CREATININE 0.92 0.98 0.92 0.86  GFRNONAA >60 >60 >60 >60  GFRAA >60 >60 >60 >60    LIVER FUNCTION TESTS: Recent Labs    07/15/20 0735 07/15/20 0735 07/17/20 0254 07/19/20 0247 07/30/20 2353 07/31/20 0421  BILITOT 0.8  --   --  0.8 0.5 0.6  AST 29  --   --  20 19 23   ALT 27  --   --  24 18 19   ALKPHOS 78  --   --  73 78 82  PROT 6.6  --   --  5.3* 6.0* 6.4*  ALBUMIN 2.5*   < > 1.9* 1.8* 2.1* 2.2*   < > = values in this interval not displayed.     Assessment and Plan:  64 y.o, male inpatient. History of DM, HTN, HLD, .Admitted for AMS associated with hypoglycemia and ongoing cellulitis of the left hip X 2 -3 months. Patient reports inially  following on to concrete and injury his left hip. Patient was admitted in July for MSSA infection the area and discharged on doxycycline. Patient has a MR Hip form 8.11.21 that reads Large soft tissue ulceration along the lateral left hip extending near the left greater trochanter with surrounding  cellulitis and vastus lateralis myositis. There is a curvilinear 4.4 cm fluid collection along the deep inferior margin of the ulcer, consistent with abscess. No osteomyelitis or septic  arthritis. Team is requesting aspiration of left hip wound for further evaluation and treatment planning.   Patient has no pertinent allergies. WBC is 6.7. Blood cultures from 8.13.21 reads staphylococcus species. All other labs and are within acceptable parameters. Patient is on subcutaneous prophylactic dose of lovenox.  Patient is afebrile.  IR consulted for possible left hip wound aspiration. Case has been reviewed and procedure approved by Dr. Fredia Sorrow.  Patient tentatively scheduled for 8.13.21.  Team instructed to: Keep Patient to be NPO after midnight Hold prophylactic anticoagulation 24 hours prior to scheduled procedure.   IR will call patient when ready.   Risks and benefits of left hip aspiration was discussed with the patient and/or patient's family including, but not limited to bleeding, infection, damage to adjacent structures or low yield requiring additional tests.  All of the questions were answered and there is agreement to proceed.  Consent signed and in chart.   Thank you for this interesting consult.  I greatly enjoyed meeting [name] and look forward to participating in their care.  A copy of this report was sent to the requesting provider on this date.  Electronically Signed: Alene Mires, NP 08/03/2020, 10:59 AM   I spent a total of 40 Minutes    in face to face in clinical consultation, greater than 50% of which was counseling/coordinating care for left hip wound aspiration

## 2020-08-03 NOTE — Progress Notes (Signed)
Patient is uncooperative not allowing assessment VS CBG's he refused  His medications and told me to leave his room and leave him alone. MD on called notified. Ilean Skill LPN

## 2020-08-03 NOTE — NC FL2 (Addendum)
MEDICAID FL2 LEVEL OF CARE SCREENING TOOL     IDENTIFICATION  Patient Name: RILEY HALLUM Birthdate: Sep 27, 1956 Sex: male Admission Date (Current Location): 07/30/2020  [facility] and [place] Number:  Producer, television/film/video and Address:  [facility], [address], [place], [place] [address]      Provider Number: [id number]  Attending Physician Name and Address:  Miguel Aschoff, MD  Relative Name and Phone Number:       Current Level of Care: Hospital Recommended Level of Care: Skilled Nursing Facility Prior Approval Number:    Date Approved/Denied:   PASRR Number: [id number]  Discharge Plan: SNF    Current Diagnoses: Patient Active Problem List   Diagnosis Date Noted  . Hypoglycemia 07/31/2020  . Purulent Cellulitis of left hip 07/15/2020  . Recurrent falls 07/15/2020  . Chronic pain 07/15/2020  . 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 Physical deconditioning 07/15/2020  . Asymptomatic bacteriuria 07/15/2020  . Diabetes mellitus (HCC) 01/28/2012    Orientation RESPIRATION BLADDER Height & Weight     Self, Time, Situation, Place  Normal Incontinent Weight: 220 lb (99.8 kg) Height:  5\' 10"  (177.8 cm)  BEHAVIORAL SYMPTOMS/MOOD NEUROLOGICAL BOWEL NUTRITION STATUS      Incontinent Diet (See d/c summary)  AMBULATORY STATUS COMMUNICATION OF NEEDS Skin   Extensive Assist Verbally Other (Comment) (dehisced wound w/ packing on hip; wound on sacrum w/ foam dressing)                       Personal Care Assistance Level of Assistance  Bathing, Feeding, Dressing Bathing Assistance: Maximum assistance Feeding assistance: Independent Dressing Assistance: Maximum assistance     Functional Limitations Info  Speech, Hearing, Sight Sight Info: Adequate Hearing Info: Adequate Speech Info: Adequate    SPECIAL CARE FACTORS FREQUENCY  OT (By licensed OT), PT (By licensed PT)  OT Frequency: 5x week PT Frequency: 5x  week                  Contractures Contractures Info: Not present    Additional Factors Info  Code Status, Allergies Code Status Info: DNR Allergies Info: Lipitor (Atorvastatin Calcium)           Current Medications (08/03/2020):  This is the current hospital active medication list Current Facility-Administered Medications  Medication Dose Route Frequency Provider Last Rate Last Admin  . 0.9 %  sodium chloride infusion  250 mL Intravenous PRN Seawell, Jaimie A, DO      . acetaminophen (TYLENOL) tablet 650 mg  650 mg Oral Q6H PRN Seawell, Jaimie A, DO   650 mg at 08/02/20 0811   Or  . acetaminophen (TYLENOL) suppository 650 mg  650 mg Rectal Q6H PRN Seawell, Jaimie A, DO      . acetaminophen (TYLENOL) tablet 1,000 mg  1,000 mg Oral TID Bloomfield, Carley D, DO   1,000 mg at 08/02/20 2128  . baclofen (LIORESAL) tablet 5 mg  5 mg Oral BID Bloomfield, Carley D, DO   5 mg at 08/02/20 2129  . cefTRIAXone (ROCEPHIN) 1 g in sodium chloride 0.9 % 100 mL IVPB  1 g Intravenous Q24H Seawell, Jaimie A, DO   Paused at 08/03/20 0410  . doxycycline (VIBRA-TABS) tablet 100 mg  100 mg Oral Q12H 08/05/20, MD   100 mg at 08/02/20 2129  . enoxaparin (LOVENOX) injection 40 mg  40 mg Subcutaneous Q24H Seawell, Jaimie A, DO   40 mg  at 08/02/20 1817  . fentaNYL (SUBLIMAZE) 100 MCG/2ML injection           . lidocaine (PF) (XYLOCAINE) 1 % injection           . midazolam (VERSED) 2 MG/2ML injection           . oxyCODONE (Oxy IR/ROXICODONE) immediate release tablet 5 mg  5 mg Oral Q6H PRN Seawell, Jaimie A, DO   5 mg at 08/03/20 1151  . polyethylene glycol (MIRALAX / GLYCOLAX) packet 17 g  17 g Oral BID Steffanie Rainwater, MD   17 g at 08/01/20 2143  . sodium chloride flush (NS) 0.9 % injection 3 mL  3 mL Intravenous Q12H Seawell, Jaimie A, DO   3 mL at 08/03/20 1101  . sodium chloride flush (NS) 0.9 % injection 3 mL  3 mL Intravenous PRN Seawell, Jaimie A, DO         Discharge  Medications: Please see discharge summary for a list of discharge medications.  Relevant Imaging Results:  Relevant Lab Results:   Additional Information SSN [id number]  Doy Hutching, LCSW

## 2020-08-03 NOTE — NC FL2 (Deleted)
[facility] MEDICAID FL2 LEVEL OF CARE SCREENING TOOL     IDENTIFICATION  Patient Name: [name] Birthdate: 09/08/1956 Sex: male Admission Date (Current Location): 07/30/2020  [facility] and [place] Number:      Facility and Address:         Provider Number:    Attending Physician Name and Address:  Miguel Aschoff, MD  Relative Name and Phone Number:       Current Level of Care:   Recommended Level of Care:   Prior Approval Number:    Date Approved/Denied:   PASRR Number:    Discharge Plan:      Current Diagnoses: Patient Active Problem List   Diagnosis Date Noted  . Hypoglycemia 07/31/2020  . Purulent Cellulitis of left hip 07/15/2020  . Recurrent falls 07/15/2020  . Chronic pain 07/15/2020  . 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 Physical deconditioning 07/15/2020  . Asymptomatic bacteriuria 07/15/2020  . Diabetes mellitus (HCC) 01/28/2012    Orientation RESPIRATION BLADDER Height & Weight            Weight: 220 lb (99.8 kg) Height:  5\' 10"  (177.8 cm)  BEHAVIORAL SYMPTOMS/MOOD NEUROLOGICAL BOWEL NUTRITION STATUS           AMBULATORY STATUS COMMUNICATION OF NEEDS Skin                               Personal Care Assistance Level of Assistance              Functional Limitations Info             SPECIAL CARE FACTORS FREQUENCY                       Contractures      Additional Factors Info                  Current Medications (08/03/2020):  This is the current hospital active medication list Current Facility-Administered Medications  Medication Dose Route Frequency Provider Last Rate Last Admin  . 0.9 %  sodium chloride infusion  250 mL Intravenous PRN Seawell, Jaimie A, DO      . acetaminophen (TYLENOL) tablet 650 mg  650 mg Oral Q6H PRN Seawell, Jaimie A, DO   650 mg at 08/02/20 0811   Or  . acetaminophen (TYLENOL) suppository 650 mg  650 mg Rectal Q6H PRN Seawell, Jaimie A, DO      .  acetaminophen (TYLENOL) tablet 1,000 mg  1,000 mg Oral TID Bloomfield, Carley D, DO   1,000 mg at 08/02/20 2128  . baclofen (LIORESAL) tablet 5 mg  5 mg Oral BID Bloomfield, Carley D, DO   5 mg at 08/02/20 2129  . cefTRIAXone (ROCEPHIN) 1 g in sodium chloride 0.9 % 100 mL IVPB  1 g Intravenous Q24H Seawell, Jaimie A, DO   Paused at 08/03/20 0410  . doxycycline (VIBRA-TABS) tablet 100 mg  100 mg Oral Q12H 08/05/20, MD   100 mg at 08/02/20 2129  . enoxaparin (LOVENOX) injection 40 mg  40 mg Subcutaneous Q24H Seawell, Jaimie A, DO   40 mg at 08/02/20 1817  . oxyCODONE (Oxy IR/ROXICODONE) immediate release tablet 5 mg  5 mg Oral Q6H PRN Seawell, Jaimie A, DO   5 mg at 08/03/20 1151  . polyethylene glycol (MIRALAX / GLYCOLAX) packet 17 g  17 g Oral BID 08/05/20, MD  17 g at 08/01/20 2143  . sodium chloride flush (NS) 0.9 % injection 3 mL  3 mL Intravenous Q12H Seawell, Jaimie A, DO   3 mL at 08/03/20 1101  . sodium chloride flush (NS) 0.9 % injection 3 mL  3 mL Intravenous PRN Seawell, Jaimie A, DO         Discharge Medications: Please see discharge summary for a list of discharge medications.  Relevant Imaging Results:  Relevant Lab Results:   Additional Information    Doy Hutching, LCSW

## 2020-08-03 NOTE — Progress Notes (Signed)
Physical Therapy Treatment Patient Details Name: [name] MRN: [id number] DOB: [birth year] Today's Date: 08/03/2020    History of Present Illness Pt is a 64 y/o male admitted secondary to symptomatic hypoglycemia and CAP. PMH includes DM, HTN, and recent R hip cellulitis.     PT Comments    Patient initially refusing PT session, then when I returned he agrees to bed level exercises. Reports he is going for draining of hip soon. He is able to perform bed mobility, and sit on side of bed with supervision to min guard assist. Performed LE strengthening exercises in sitting and supine. He will continue to benefit from skilled PT while here to improve LE strength and functional independence.     Follow Up Recommendations  SNF;Supervision/Assistance - 24 hour     Equipment Recommendations  None recommended by PT    Recommendations for Other Services       Precautions / Restrictions Precautions Precautions: Fall Precaution Comments: Had fall at SNF  Restrictions Weight Bearing Restrictions: No    Mobility  Bed Mobility Overal bed mobility: Needs Assistance Bed Mobility: Supine to Sit   Sidelying to sit: Min guard;HOB elevated Supine to sit: Min guard;HOB elevated Sit to supine: Min guard;HOB elevated   General bed mobility comments: able to get self to edge of bed with no real physical assist just guarding  Transfers                 General transfer comment: patient reports he is to go to have hip wound drained and wants to stay in bed.  Ambulation/Gait             General Gait Details: unable   Stairs             Wheelchair Mobility    Modified Rankin (Stroke Patients Only)       Balance Overall balance assessment: Modified Independent Sitting-balance support: Bilateral upper extremity supported;Feet supported Sitting balance-Leahy Scale: Good Sitting balance - Comments: able to maintain sitting without external assist.                                     Cognition Arousal/Alertness: Awake/alert Behavior During Therapy: WFL for tasks assessed/performed Overall Cognitive Status: Within Functional Limits for tasks assessed                                        Exercises Other Exercises Other Exercises: B LE exercises: ap, heel slides, hip abd/add, SLR, seated marching, seated LAQ x10 reps each. Assists left leg with UEs at times due to weakness.    General Comments        Pertinent Vitals/Pain Pain Assessment: Faces Faces Pain Scale: Hurts little more Pain Location: low back and L hip Pain Descriptors / Indicators: Grimacing;Constant;Aching Pain Intervention(s): Monitored during session;Repositioned;Limited activity within patient's tolerance    Home Living                      Prior Function            PT Goals (current goals can now be found in the care plan section) Acute Rehab PT Goals Patient Stated Goal: to decrease pain  PT Goal Formulation: With patient Time For Goal Achievement: 08/14/20 Potential to Achieve Goals: Fair Progress towards PT goals:  Progressing toward goals    Frequency    Min 2X/week      PT Plan Current plan remains appropriate    Co-evaluation              AM-PAC PT "6 Clicks" Mobility   Outcome Measure  Help needed turning from your back to your side while in a flat bed without using bedrails?: A Little Help needed moving from lying on your back to sitting on the side of a flat bed without using bedrails?: A Little Help needed moving to and from a bed to a chair (including a wheelchair)?: Total Help needed standing up from a chair using your arms (e.g., wheelchair or bedside chair)?: Total Help needed to walk in hospital room?: Total Help needed climbing 3-5 steps with a railing? : Total 6 Click Score: 10    End of Session   Activity Tolerance: Patient limited by pain;Patient limited by fatigue Patient left: in  bed;with call bell/phone within reach;with bed alarm set Nurse Communication: Mobility status PT Visit Diagnosis: Muscle weakness (generalized) (M62.81);Unsteadiness on feet (R26.81);Pain;Difficulty in walking, not elsewhere classified (R26.2);Other abnormalities of gait and mobility (R26.89) Pain - Right/Left: Left Pain - part of body: Hip     Time: 0086-7619 PT Time Calculation (min) (ACUTE ONLY): 14 min  Charges:  $Therapeutic Exercise: 8-22 mins                     Robley Matassa, PT, GCS 08/03/20,1:12 PM

## 2020-08-03 NOTE — Progress Notes (Signed)
   Covid-19 Vaccination Clinic  Name:  [name]    MRN: [id number] DOB: [birth year]  08/03/2020  [name] was observed post Covid-19 immunization for 15 minutes without incident. He was provided with Vaccine Information Sheet and instruction to access the V-Safe system.   [name] was instructed to call 911 with any severe reactions post vaccine: Marland Kitchen Difficulty breathing  . Swelling of face and throat  . A fast heartbeat  . A bad rash all over body  . Dizziness and weakness   Immunizations Administered    Name Date Dose VIS Date Route   JANSSEN COVID-19 VACCINE 08/03/2020 12:07 PM 0.5 mL 02/18/2020 Intramuscular   Manufacturer: [employer]   Lot: [id number]   NDC: 10258-527-78

## 2020-08-03 NOTE — Procedures (Signed)
Interventional Radiology Procedure Note  Procedure: US guided aspiration of left lower extremity soft tissues  Complications: None  Estimated Blood Loss: < 10 mL  Findings: By [place], difficult to define any discrete fluid collections at level of posterolateral wound. Several areas of complex appearing tissue aspirated with 18 G spinal needle yielding couple mL of bloody fluid. Sample sent for culture.  Jodi Marble. Fredia Sorrow, M.D Pager:  [phone]

## 2020-08-04 LAB — CBC
HCT: 30.2 % — ABNORMAL LOW (ref 39.0–52.0)
Hemoglobin: 9.7 g/dL — ABNORMAL LOW (ref 13.0–17.0)
MCH: 30.1 pg (ref 26.0–34.0)
MCHC: 32.1 g/dL (ref 30.0–36.0)
MCV: 93.8 fL (ref 80.0–100.0)
Platelets: 351 10*3/uL (ref 150–400)
RBC: 3.22 MIL/uL — ABNORMAL LOW (ref 4.22–5.81)
RDW: 14.1 % (ref 11.5–15.5)
WBC: 7 10*3/uL (ref 4.0–10.5)
nRBC: 0 % (ref 0.0–0.2)

## 2020-08-04 LAB — BASIC METABOLIC PANEL
Anion gap: 12 (ref 5–15)
BUN: 9 mg/dL (ref 8–23)
CO2: 21 mmol/L — ABNORMAL LOW (ref 22–32)
Calcium: 8.3 mg/dL — ABNORMAL LOW (ref 8.9–10.3)
Chloride: 107 mmol/L (ref 98–111)
Creatinine, Ser: 1.08 mg/dL (ref 0.61–1.24)
GFR calc Af Amer: >60 mL/min (ref >60)
GFR calc non Af Amer: >60 mL/min (ref >60)
Glucose, Bld: 91 mg/dL (ref 70–99)
Potassium: 3.6 mmol/L (ref 3.5–5.1)
Sodium: 140 mmol/L (ref 135–145)

## 2020-08-04 LAB — GLUCOSE, CAPILLARY
Glucose-Capillary: 75 mg/dL (ref 70–99)
Glucose-Capillary: 300 mg/dL — ABNORMAL HIGH (ref 70–99)
Glucose-Capillary: 108 mg/dL — ABNORMAL HIGH (ref 70–99)
Glucose-Capillary: 98 mg/dL (ref 70–99)

## 2020-08-04 NOTE — Progress Notes (Signed)
1200 Blood sugar 300- MD at bedside and is aware.

## 2020-08-04 NOTE — Progress Notes (Addendum)
Subjective:  Hospital Day: 5  No overnight events  [name] was evaluated at bedside this morning and noted to be sitting comfortably in his recliner. He appeared to have much better energy today. He reports that he feels somewhat better but that his hip pain is unchanged. He has had no further abdominal pain or pressure and had a bowel movement this morning. He does not report any pain or difficulty with his urination. He does endorse minimal shortness of breath and dry cough but this has improved since admission day as well.  He asked when he will be able to leave. We told him that we are waiting on getting him a bed at a skilled nursing facility, but we will discharge him as soon as that happens.  He received his pneumovax-23 and COVID-19 vaccines while hospitalized.  Objective: Vital signs in last 24 hours: Vitals:   08/03/20 1956 08/03/20 2212 08/04/20 0429 08/04/20 0940  BP: 138/62 131/67 130/65 (!) 141/72  Pulse: 97 (!) 108 100 (!) 110  Resp: 19 18 18 20   Temp: 98.1 F (36.7 C) 97.7 F (36.5 C) 98 F (36.7 C) 98.1 F (36.7 C)  TempSrc: Oral Oral Oral Oral  SpO2: 99% 97% 98% 100%  Weight:      Height:       Weight change:   Intake/Output Summary (Last 24 hours) at 08/04/2020 1350 Last data filed at 08/04/2020 0950 Gross per 24 hour  Intake --  Output 300 ml  Net -300 ml   Physical Exam Vitals and nursing note reviewed.  Constitutional: no acute distress, sitting comfortably upright in recliner Cardiovascular: regular rate and rhythm, normal heart sounds Pulmonary: effort normal, decreased lungsounds noted bilateral lower lung field, no rales present Abdominal: flat, nontender, no rebound tenderness, bowel sounds normal Musculoskeletal: no peripheral edema Skin: warm and dry Neurological: alert, no focal deficit Psychiatric: normal mood and affect, positive and energetic  Lab Results: CBC Latest Ref Rng & Units 08/04/2020 08/02/2020 07/31/2020  WBC 4.0 - 10.5  K/uL 7.0 6.7 15.5(H)  Hemoglobin 13.0 - 17.0 g/dL 09/30/2020) 8.6(V) 7.8(I)  Hematocrit 39 - 52 % 30.2(L) 29.2(L) 31.4(L)  Platelets 150 - 400 K/uL 351 376 389   CMP Latest Ref Rng & Units 08/04/2020 08/02/2020 07/31/2020  Glucose 70 - 99 mg/dL 91 09/30/2020) 86  BUN 8 - 23 mg/dL 9 10 21   Creatinine 0.61 - 1.24 mg/dL 295(M 8.41  Sodium 135 - 145 mmol/L 140 140 136  Potassium 3.5 - 5.1 mmol/L 3.6 3.9 3.9  Chloride 98 - 111 mmol/L 107 106 100  CO2 22 - 32 mmol/L 21(L) 26 24  Calcium 8.9 - 10.3 mg/dL 8.3(L) 8.4(L) 8.5(L)  Total Protein 6.5 - 8.1 g/dL - - 6.4(L)  Total Bilirubin 0.3 - 1.2 mg/dL - - 0.6  Alkaline Phos 38 - 126 U/L - - 82  AST 15 - 41 U/L - - 23  ALT 0 - 44 U/L - - 19     Studies/Results: 3.24 Guided Needle Placement IMPRESSION: Multiple areas were aspirated in the region of MRI abnormality along the deep margin of the soft tissue wound of the left lower extremity yielding only a small amount of bloody fluid. This was sent for culture analysis. Electronically Signed   By: 4.01 M.D.   On: 08/03/2020 18:03   Assessment/Plan: Principal Problem:   Hypoglycemia Active Problems:   Purulent Cellulitis of left hip   Chronic pain  Mr. Imperato is a 64 year old  male with a PMHx of well controlled DM, HTN, and HLD who presented to the ED from [facility] for AMS in the setting of hypoglycemia. 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 Stable, ready for d/c pending SNF placement.   Symptomatic Hypoglycemia, Resolved Admitted for confusion, profuse sweating, and lethargy with several low CBG levels, 39-49 that did not respond to glucagon administration. He has been eating well and maintaining glucose levels above 100 since his admission. Left hip MRI indicates abscess in the soft tissue of his left leg without osteomyelitis. This infection is likely the cause of his hypoglycemic episode. He is stable and ready for d/c pending SNF  placement. 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 IR aspirated but were unable to drain, C&S pending. Pt has been afebrile and well appearing. Stable and ready for discharge on Doxycycline 100 mg BID. Will alter regimen as needed based on sensitivities. Pt will be referred to outpatient wound care. --Consulted wound care for dressing changes, appreciate their assistance -Follow-up wound aerobic/anaerobic culture --f/u wound care referral   Community Acquired Pneumonia  Patient endorsed SOB and cough x 2 days on admission. CXR showed a possible LLL pneumonia. He is afebrile, no longer has a leukocytosis, and has improved symptomatically. He has been treated with 5 days of Rocephin and 5 days of atypical coverage with Azithro and Doxy --d/c Rocephin   Spasticity Improved significantly since restarting his baclofen. --Cont baclofen 5 mg BID   Constipation Improved abdominal tenderness and pressure since he started using the MiraLAX. Continue PRN bowel regimen.  Denies any abdominal pain today. --Miralax 17g packet daily PRN   Well-Controlled Type II DM Hgb A1c 07/15/20 was 6.9. Patient and nursing staff state he has not been taking any glucose-lowering medications. Hypoglycemic episode likely due to acute infection.   Stable Normocytic Anemia Hemoglobin 9.4, MCV 93.3. Iron studies 07/15/20 showed low iron 37, low TIBC 199, saturation ratios 19 and elevated ferritin 343, consistent with either acute IDA or IDA in the setting of anemia of chronic disease. No hematuria noted on UA. --Monitor CBCs   Chronic Pain Syndrome  Patient takes Oxycodone 10mg  q6hrs and Baclofen 5mg  BID with Miralax. Pt no longer complaining of right foot pain. --Start Oxycodone 5mg  q6hrs prn --Continue Miralax 17g packet daily PRN   Prior to Admission Living Arrangement: SNF Anticipated Discharge  Location: SNF Barriers to Discharge: Pending SNF placement Dispo: Anticipated discharge in approximately 1-2 day(s).    LOS: 4 days   , Medical Student 08/04/2020, 1:50 PM  Attestation for Student Documentation:  I personally was present and performed or re-performed the history, physical exam and medical decision-making activities of this service and have verified that the service and findings are accurately documented in the student's note.  D, DO 08/05/2020, 10:28 AM

## 2020-08-04 NOTE — Progress Notes (Signed)
Patient refused 1400 VS.

## 2020-08-04 NOTE — Progress Notes (Signed)
Patient HR sustained in the 140's per telemetry. RN at patient's bedside and HR in the 120's. Patient had scooted to the end of bed and was trying to get up. VS stable. MD made aware of HR and states they will round to see patient shortly.

## 2020-08-04 NOTE — Plan of Care (Signed)

## 2020-08-05 LAB — GLUCOSE, CAPILLARY
Glucose-Capillary: 90 mg/dL (ref 70–99)
Glucose-Capillary: 85 mg/dL (ref 70–99)
Glucose-Capillary: 92 mg/dL (ref 70–99)
Glucose-Capillary: 109 mg/dL — ABNORMAL HIGH (ref 70–99)

## 2020-08-05 LAB — CULTURE, BLOOD (ROUTINE X 2)
Culture: NO GROWTH
Special Requests: ADEQUATE

## 2020-08-05 NOTE — TOC Progression Note (Signed)
Transition of Care [facility]) - Progression Note    Patient Details  Name: [name] MRN: [id number] Date of Birth: [birth year]  Transition of Care [facility]) CM/SW [place], [place] Phone Number: [phone] 08/05/2020, 4:28 PM  Clinical Narrative:     CSW reached out to [facility] and was unable to reach facility. CSW reached out to patient's brother Fritz Pickerel and discussed the other pending bed offers. Fritz Pickerel expressed that he wanted patient to stay in [place] and did not want hm to return to [facility] care. Fritz Pickerel identified [facility] as SNF choice.  CSW met with patient to discuss the updates about his discharge and ascertain if he was in agreement with [facility]. Patient was in agreement with [facility].  CSW reached out to facility director [place] to check for bed readiness. CSW informed Ronny Bacon that patient should be ready for discharge tomorrow due to needing a new COVID test.  Patient insurance has been approved and facility name given to [facility].  Everlene Balls #- [id number] Health Plan #- still pending Case Manager: Luz Brazen Authorization dates: 8/16-8/18 Fax Number: [phone]     Expected Discharge Plan: [place] Barriers to Discharge: Continued Medical Work up, Orthoptist and Services Expected Discharge Plan: [place] In-house Referral: Clinical Social Work Discharge Planning Services: CM Consult   Living arrangements for the past 2 months: [place], [place]                                       Social Determinants of Health (SDOH) Interventions    Readmission Risk Interventions Readmission Risk Prevention Plan 08/01/2020  Post Dischage Appt Complete  Medication Screening Complete  Transportation Screening Complete  Some recent data might be hidden

## 2020-08-05 NOTE — Progress Notes (Signed)
Subjective:   Yesterday, [name] became angry at his brother and caused him to be agitated all day. He requested to go home, telling his brother that the medical team could not find him a place. His brother, [name], was called and informed that we do not think it is safe for him to go home and we still plan on sending him to a nurse home to get the care he needs. Patient refused his vitals and Lovenox and refused to talk with staff.   This morning, he was a little calm but still not interested in talk to [place]. States his back and hip skill helps. He denies any abdominal pain.  He is still hoping to go home but they team him that the CSW is working on getting his insurance to authorize the transfer.   Objective:  Vital signs in last 24 hours: Vitals:   08/04/20 0429 08/04/20 0940 08/04/20 2102 08/05/20 0505  BP: 130/65 (!) 141/72 132/68 120/74  Pulse: 100 (!) 110 100 98  Resp: _0 Temp: 98 F (36.7 C) 98.1 F (36.7 C) 98.5 F (36.9 C) 98 F (36.7 C)  TempSrc: Oral Oral Oral Oral  SpO2: 98% 100% 99% 100%  Weight:      Height:       General: Agitated elderly man laying on bed. Turns away throughout the encounter. No acute distress. [place]/AT: [place]/AT Cardiovascular: RRR. No m/r/g. No LE edema Pulmonary: Lungs CTAB. No wheezing. Normal effort. Abdominal: Soft, non-tender. Non-distended. Normal bowel sounds. Musculoskeletal:Normal ROM Skin: Warm and dry, wound covered in bandage Neurological: A&Ox3. Moves all extremities. Normal sensation Psychiatric: Normal mood and affect  Assessment/Plan:  Principal Problem:   Hypoglycemia Active Problems:   Purulent Cellulitis of left hip   Chronic pain  [name] is a 64 year old male with a PMHx of well controlled DM, HTN, and HLD who presented to the ED from [facility] for AMS in the setting of hypoglycemia.Stable, normalblood sugars and mental status since admission. Infection suspected cause of hypoglycemic  episode.  Symptomatic Hypoglycemia Admitted for confusion, profuse sweating, and lethargy with several low CBG levels, 39-49 that did not respond to glucagon administration.[name] has required no further IV glucosesince day 1. He has been eating well and maintaining glucose levels above 100 since his admission. Given two possible sources of infection at this time, infection is the most likely cause of his hypoglycemia and AMS. He is currently being treated for CAP found on x-ray after his complaints of productive cough and SOB. Left hip MRI indicates abscess in the soft tissue of his left leg without osteomyelitis. CBG stable. No signs of hypoglycemia. --Continue regular diet --Daily CBGs  Cellulitis of the Left Hip Elevated CRP to 9.1 and ESR to 103 with history of MSSA cellulitis, worsening left hip pain, fever, and chills. 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 Orthopedics consulted and recommend IR drainage for culture and sens. rather than surgical intervention.  Planned catheter insertion and drainage of abscess by IR today. --Consulted wound care for dressing changes, appreciate their assistance --Consulted ortho, appreciate recs --Follow-up abscess drainage by IR -Aerobic/anaerobic culture w/ no growth in <24hr --Wound care referral on d/c  Community Acquired Pneumonia  Patient endorses SOB and cough x 3 days with intermittent fevers and chills. Cough improving today with less sputum production. Rales still present left lower lung field on exam.2-view CXR confirmed left lower lobe consolidation.Leukocytosis resolved (  WBC of 7). Will continue tx for CAP. --Completed course of Rocephin and azithromycin --Continue doxycycline 100 mg Q12H  --White count now normal  Spasticity Improved significantly since restarting his baclofen. --Cont baclofen 5 mg BID  Constipation Improved abdominal tenderness and pressure since he  started using the MiraLAX. Continue PRN bowel regimen.  Denies any abdominal pain today. --Miralax 17g packet daily PRN  Polyuria Stable. Likely in part due to supine position during urination. Denies pain, hematuria, or difficulty initiating urine stream. No further work-up inpatient at this time.  Well-Controlled Type II DM Hgb A1c 07/15/20 was 6.9. Patient and nursing staff state he has not been taking any glucose-lowering medications.Hypoglycemic episode likely due to acute infection.  Stable Normocytic Anemia Hemoglobin 9.7, MCV 93.8. Iron studies 07/15/20 showed low iron 37, low TIBC 199, saturation ratios 19 and elevated ferritin 343, consistent with either acute IDA or IDA in the setting of anemia of chronic disease.No hematuria noted on UA. --Monitor CBCs  Chronic Pain Syndrome  Patient takes Oxycodone 14m q6hrs and Baclofen 594mBID with Miralax.Pt no longer complaining of right foot pain. --Continue Oxycodone 78m78m6hrsprn --Continue Miralax 17g packet daily PRN  Prior to Admission Living Arrangement: SNF Anticipated Discharge Location: SNF Barriers to Discharge: Pending SNF placement Dispo: Anticipated discharge in approximately 1-2 day(s).   AmpLacinda AxonD 08/05/2020, 7:25 AM Pager: [phone] Medicine Teaching Service After 5pm on weekdays and 1pm on weekends: On Call pager [phone]

## 2020-08-05 NOTE — Discharge Summary (Signed)
Name: [name] MRN: [id number] DOB: 1956-01-18 64 y.o. PCP: Patient, No Pcp Per  Date of Admission: 07/30/2020 11:47 PM Date of Discharge:  Attending Physician: Miguel Aschoff, MD  Discharge Diagnosis: 1. Hypoglycemia 2. Purulent Cellulitis of the left hip  Discharge Medications: Allergies as of 08/06/2020      Reactions   Lipitor [atorvastatin Calcium] Other (See Comments)   Muscle pain      Medication List    TAKE these medications   acetaminophen 500 MG tablet Commonly known as: TYLENOL Take 2 tablets (1,000 mg total) by mouth 3 (three) times daily. What changed:   medication strength  how much to take  when to take this  reasons to take this   baclofen 5 mg Tabs tablet Commonly known as: LIORESAL Take 5 mg by mouth 2 (two) times daily.   doxycycline 100 MG tablet Commonly known as: VIBRA-TABS Take 1 tablet (100 mg total) by mouth every 12 (twelve) hours.   feeding supplement (PRO-STAT SUGAR FREE 64) Liqd Take 30 mLs by mouth 3 (three) times daily with meals.   gabapentin 400 MG capsule Commonly known as: NEURONTIN Take 1 capsule (400 mg total) by mouth 3 (three) times daily.   ibuprofen 200 MG tablet Commonly known as: ADVIL Take 400 mg by mouth once.   oxyCODONE 5 MG immediate release tablet Commonly known as: Oxy IR/ROXICODONE Take 2 tablets (10 mg total) by mouth every 6 (six) hours as needed for moderate pain or severe pain.   polyethylene glycol 17 g packet Commonly known as: MIRALAX / GLYCOLAX Take 17 g by mouth 2 (two) times daily for 14 days.   Vitamin D3 25 MCG tablet Commonly known as: Vitamin D Take 1 tablet (1,000 Units total) by mouth daily.            Discharge Care Instructions  (From admission, onward)         Start     Ordered   08/06/20 0000  Change dressing (specify)       Comments: Dressing change: 1 times per day   08/06/20 1213          Disposition and follow-up:   Mr.[name] was  discharged from [facility] in Stable condition.  At the hospital follow up visit please address:  1. Blood sugar: Patient was admitted for hypoglycemia. He will require require regular blood sugar checks and adjustments to his diabetic regimen. 2. Hip wound: He has had this wound for some time. It has develop an abscess that is draining on its own. He is being treatment with antibiotics. He will require regular wound care.   2.  Labs / imaging needed at time of follow-up: CBC, BMP  3.  Pending labs/ test needing follow-up: Wound culture (No growth after 24 hours on discharge)  Follow-up Appointments:   Hospital Course by problem list: 1. Symptomatic Hypoglycemia, Resolved Admitted for confusion, profuse sweating, and lethargy with several low CBG levels, 39-49 that did not respond to glucagon administration. He was given Dextrose 10% until his hypoglycemia resolved. He was eating well and maintaining glucose levels above 100 the day after his admission. Left hip MRI indicates abscess in the soft tissue of his left leg without osteomyelitis.This infection is likely the cause of his hypoglycemic episode. He was stable on discharge. He will require regular blood sugar checks.   Cellulitis of the Left Hip Presented with ongoing left hip wound and pain. 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 Wound culture had some rare gram+ cocci but no WBC seen or growth in 2 days. Pt was afebrile and well appearing throughout the hospitalization. We are discharging him on Doxycycline 100 mg BID for 14 days. We have set up an appointment with the [facility] on Sept. 14th at 10:30 am but he will require daily wound care and dressing changes.   Community Acquired Pneumonia  Patient endorsed SOB and cough x 2 days on admission. CXR showed a possible LLL pneumonia. He was treated with 5 days of Rocephin  and 5 days of atypical coverage with Azithro and Doxy. He was afebrile throughout hospitalization. On discharge, he no longer had a leukocytosis, and had improved symptomatically. Continue doxycyline for 14 more days as above.  Spasticity Patient experienced some jerky movements a few days into his admission. These movements resolved after we restarted his baclofen. Continuebaclofen 5 mg BID.  Constipation Patient developed constipation and abdominal pressure a few days into his hospitalization. He was prescribed Miralax and this improved his constipation and abdominal pressure.  Well-Controlled Type II DM Hgb A1c 07/15/20 was 6.9. We did not have him on any diabetic medications during his hospitalization.   Stable Normocytic Anemia Hemoglobin 9.4, MCV 93.3. Iron studies 07/15/20 showed low iron 37, low TIBC 199, saturation ratios 19 and elevated ferritin 343, consistent with either acute IDA or IDA in the setting of anemia of chronic disease.Urinalysis did not show any hematuria. His hemoglobin was at 9.7 on discharge.follow with a CBC and any signs of GI bleeding.   Chronic Pain Syndrome  Patient takes Oxycodone 10mg  q6hrs and Baclofen 5mg  BID for back and hip pain. We continued his Oxycodone 5mg  q6hrsas needed.   Discharge Vitals:   BP 120/74 (BP Location: Left Arm)   Pulse 98   Temp 98 F (36.7 C) (Oral)   Resp 18   Ht 5\' 10"  (1.778 m)   Wt 99.8 kg   SpO2 100%   BMI 31.57 kg/m   Pertinent Labs, Studies, and Procedures:  CBC Latest Ref Rng & Units 08/04/2020 08/02/2020 07/31/2020  WBC 4.0 - 10.5 K/uL 7.0 6.7 15.5(H)  Hemoglobin 13.0 - 17.0 g/dL ) 08/06/2020) 10/02/2020)  Hematocrit 39 - 52 % 30.2(L) 29.2(L) 31.4(L)  Platelets 150 - 400 K/uL 351 376 389   BMP Latest Ref Rng & Units 08/04/2020 08/02/2020 07/31/2020  Glucose 70 - 99 mg/dL 91 3.8(S) 86  BUN 8 - 23 mg/dL 9 10 21   Creatinine 0.61 - 1.24 mg/dL 08/06/2020 10/02/2020 09/30/2020  Sodium 135 - 145 mmol/L 140 140 136  Potassium 3.5 - 5.1  mmol/L 3.6 3.9 3.9  Chloride 98 - 111 mmol/L 107 106 100  CO2 22 - 32 mmol/L 21(L) 26 24  Calcium 8.9 - 10.3 mg/dL 8.3(L) 8.4(L) 8.5(L)    Discharge Instructions:   [name],  It was a pleasure taking care of you at the hospital.  Your blood sugar is now better, your wound is healing appropriately, and we have you antibiotics to treat your infection and pneumonia.  We are discharging you to a skilled nursing facility, , where they will help care for your wound and help you get stronger.  Continue to take your medicines as prescribed.  An appointment to [facility] so they can help care for your wound.   Thank you,  Dr. 7.34  Signed: 1.93, MD 08/05/2020, 12:13  PM   Pager: [phone] Internal Medicine Teaching Service

## 2020-08-06 LAB — SARS CORONAVIRUS 2 (TAT 6-24 HRS): SARS Coronavirus 2: NEGATIVE

## 2020-08-06 LAB — GLUCOSE, CAPILLARY
Glucose-Capillary: 85 mg/dL (ref 70–99)
Glucose-Capillary: 82 mg/dL (ref 70–99)
Glucose-Capillary: 106 mg/dL — ABNORMAL HIGH (ref 70–99)
Glucose-Capillary: 97 mg/dL (ref 70–99)

## 2020-08-06 MED ORDER — OXYCODONE HCL 5 MG PO TABS: 10.0000 mg | ORAL_TABLET | Freq: Four times a day (QID) | ORAL | 0 refills | Status: DC | PRN

## 2020-08-06 MED ORDER — ACETAMINOPHEN 500 MG PO TABS: 1000.0000 mg | ORAL_TABLET | Freq: Three times a day (TID) | ORAL | 0 refills | Status: DC

## 2020-08-06 MED ORDER — DOXYCYCLINE HYCLATE 100 MG PO TABS: 100.0000 mg | ORAL_TABLET | Freq: Two times a day (BID) | ORAL | 0 refills | Status: DC

## 2020-08-06 MED ORDER — POLYETHYLENE GLYCOL 3350 17 G PO PACK: 17.0000 g | PACK | Freq: Two times a day (BID) | ORAL | 0 refills | Status: AC

## 2020-08-06 NOTE — Progress Notes (Signed)
Subjective:   No acute events overnight. Patient states he is feeling better. He continues to have back and hip pains but his stomach pain is gone. He is happy to hear that he is going home today. He was informed that he is being discharged to [facility].   Objective:  Vital signs in last 24 hours: Vitals:   08/05/20 1417 08/05/20 2019 08/06/20 0457 08/06/20 1245  BP: (!) 148/70 (!) 144/77 (!) 149/72 (!) 152/64  Pulse: 83 86 90 90  Resp: _0 Temp: 98.3 F (36.8 C) 98.7 F (37.1 C) 98.8 F (37.1 C) 97.9 F (36.6 C)  TempSrc: Oral  Oral Oral  SpO2: 100% 100% 98% 100%  Weight:      Height:       General: Elderly man laying comfortably in bed. No acute distress [place]/AT: [place]/AT Cardiovascular: RRR. No m/r/g. No LE edema Pulmonary: Lungs CTAB. No wheezing. No increased WOB.  Abdominal: Soft, non-tender. Non-distended. Normal bowel sounds. Musculoskeletal:Normal ROM Skin: Warm and dry, wound covered in bandage Neurological: A&Ox3. Moves all extremities. Normal sensation Psychiatric: Normal mood and affect  Assessment/Plan:  Principal Problem:   Hypoglycemia Active Problems:   Purulent Cellulitis of left hip   Chronic pain  [name] is a 64 year old male with a PMHx of well controlled DM, HTN, and HLD who presented to the ED from [facility] for AMS in the setting of hypoglycemia.Stable, normalblood sugars and mental status since admission. Infection suspected cause of hypoglycemic episode. Being discharged to [facility] today.  Symptomatic Hypoglycemia Admitted for confusion, profuse sweating, and lethargy with several low CBG levels, 39-49 that did not respond to glucagon administration.[name] has required no further IV glucosesince day 1. He has been eating well and maintaining glucose levels above 100 since his admission. Given two possible sources of infection at this time, infection is the most likely cause of his hypoglycemia and AMS.  He is currently being treated for CAP found on x-ray after his complaints of productive cough and SOB. Left hip MRI indicates abscess in the soft tissue of his left leg without osteomyelitis. CBG stable at 82. No signs of hypoglycemia. --Follow up CBGs   Cellulitis of the Left Hip Elevated CRP to 9.1 and ESR to 103 with history of MSSA cellulitis, worsening left hip pain, fever, and chills. 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 Orthopedics consulted and recommend IR drainage for culture and sens. rather than surgical intervention. IR drained abscess for culture but did not insert catheter as wound was draining appropriately.  --Scheduled for an appt with [facility] -Aerobic/anaerobic culture w/ no growth in 2 days  Community Acquired Pneumonia  Patient endorses SOB and cough x 3 days with intermittent fevers and chills. Cough improving today with less sputum production. Rales still present left lower lung field on exam.2-view CXR confirmed left lower lobe consolidation.Leukocytosis resolved (WBC of 7). Will continue tx for CAP. --Completed course of Rocephin and azithromycin --Continue doxycycline 100 mg for 14 more days  Spasticity Improved significantly since restarting his baclofen. --Continue baclofen 5 mg BID  Constipation Improved abdominal tenderness and pressure since he started using the MiraLAX. Continue PRN bowel regimen.   --Miralax 17g packet daily as needed  Polyuria Stable. Likely in part due to supine position during urination. Denies pain, hematuria, or difficulty initiating urine stream. No further work-up inpatient at this time.  Well-Controlled Type II DM Hgb A1c  07/15/20 was 6.9. Patient and nursing staff state he has not been taking any glucose-lowering medications.Hypoglycemic episode likely due to acute infection.  Stable Normocytic Anemia Hemoglobin 9.7, MCV 93.8. Iron studies 07/15/20 showed low  iron 37, low TIBC 199, saturation ratios 19 and elevated ferritin 343, consistent with either acute IDA or IDA in the setting of anemia of chronic disease.No hematuria noted on UA. Outpatient follow up with CBC  Chronic Pain Syndrome  Patient takes Oxycodone 36m q6hrs and Baclofen 57mBID with Miralax.Pt no longer complaining of right foot pain. --Continue Oxycodone 13m40m6hrsprn --Continue Miralax 17g packet daily PRN  Prior to Admission Living Arrangement: SNF [facility]: [facility] Dispo: Anticipated discharge today.  AmpLacinda AxonD 08/06/2020, 2:42 PM Pager: [phone] Medicine Teaching Service After 5pm on weekdays and 1pm on weekends: On Call pager [phone]

## 2020-08-06 NOTE — Social Work (Signed)
Clinical Social Worker facilitated patient discharge including contacting patient family and facility to confirm patient discharge plans.  Clinical information faxed to facility and family agreeable with plan.  CSW arranged ambulance transport via [facility] to [place] RN to call [phone]  with report prior to discharge.  Clinical Social Worker will sign off for now as social work intervention is no longer needed. Please consult us again if new need arises.  Bonnell Placzek, MSW, LCSW Clinical Social Worker   

## 2020-08-06 NOTE — Progress Notes (Signed)
Report called to Tomasa Rand, RN- awaiting arrival of [facility].

## 2020-08-06 NOTE — TOC Transition Note (Addendum)
Transition of Care [facility]) - CM/SW Discharge Note   Patient Details  Name: [name] MRN: [id number] Date of Birth: [birth year]  [facility] CM/SW Contact:  Doy Hutching, LCSW Phone Number: 08/06/2020, 2:08 PM   Clinical Narrative:    All paperwork confirmed as received by Marcelle Smiling at [facility]. She states agreement for pt to be picked up by [facility] at 3pm. Pt brother [name] called and aware. DNR and signed scripts placed on chart with [facility] papers by RN Mardella Layman.  Final next level of care: Skilled Nursing Facility Barriers to Discharge: Barriers Resolved  Patient Goals and CMS Choice Patient states their goals for this hospitalization and ongoing recovery are:: go to a new SNF closer to home if possible CMS Medicare.gov Compare Post Acute Care list provided to:: Patient Choice offered to / list presented to : Patient  Discharge Placement Existing PASRR number confirmed : 08/03/20          Patient to be transferred to facility by: [facility] Name of family member notified: pt brother [name] via telephone Patient and family notified of of transfer: 08/06/20  Discharge Plan and Services In-house Referral: Clinical Social Work Discharge Planning Services: CM Consult  Readmission Risk Interventions Readmission Risk Prevention Plan 08/01/2020  Post Dischage Appt Complete  Medication Screening Complete  Transportation Screening Complete  Some recent data might be hidden

## 2020-08-06 NOTE — TOC Progression Note (Addendum)
Transition of Care [facility]) - Progression Note    Patient Details  Name: RAYQUON USELMAN MRN: [id number] Date of Birth: 26-Oct-1956  [facility] CM/SW Contact  Doy Hutching, [place] Phone Number: 08/06/2020, 12:22 PM  Clinical Narrative:  1:30pm- Sent additional MAR and d/c summary as requested to [facility] as well. She states pt can come after 3pm. Will arrange [facility], RN Mardella Layman aware.   12:22pm- [place] unable to get on hub today, per admissions coverage Natasha request for info to be sent securely to [email or web address].  Await confirmation of receipt of clinicals to arrange d/c.    Expected Discharge Plan: Skilled Nursing Facility Barriers to Discharge: Continued Medical Work up, English as a second language teacher  Expected Discharge Plan and Services Expected Discharge Plan: Skilled Nursing Facility In-house Referral: Clinical Social Work Discharge Planning Services: CM Consult   Living arrangements for the past 2 months: Single Family Home, Skilled Nursing Facility Expected Discharge Date: 08/06/20                 Readmission Risk Interventions Readmission Risk Prevention Plan 08/01/2020  Post Dischage Appt Complete  Medication Screening Complete  Transportation Screening Complete  Some recent data might be hidden

## 2020-08-06 NOTE — Progress Notes (Signed)
Patient transported via [facility]. 

## 2020-08-06 NOTE — Progress Notes (Signed)
Attempted report x2 kept being sent to VM. Gave my number to [place] and asked RN to call me ASAP as [facility] was scheduled for 1500 pick up time.

## 2020-08-06 NOTE — Discharge Instructions (Signed)
[name],  It was a pleasure taking care of you at the hospital.  Your blood sugar is now better, your wound is healing appropriately, and we have you antibiotics to treat your infection and pneumonia.  We are discharging you to a skilled nursing facility, [place], where they will help care for your wound and help you get stronger.  Continue to take your medicines as prescribed.  An appointment to [facility] so they can help care for your wound.   Thank you,  Dr. Kirke Corin

## 2020-08-08 LAB — AEROBIC/ANAEROBIC CULTURE W GRAM STAIN (SURGICAL/DEEP WOUND)
Culture: NO GROWTH
Culture: NO GROWTH
Gram Stain: NONE SEEN
Gram Stain: NONE SEEN
Special Requests: NORMAL

## 2020-08-10 LAB — PROINSULIN/INSULIN RATIO
Insulin: 9.5 u[IU]/mL
Proinsulin/Insulin Ratio: 30 %
Proinsulin: 19 pmol/L

## 2020-09-04 ENCOUNTER — Encounter (HOSPITAL_BASED_OUTPATIENT_CLINIC_OR_DEPARTMENT_OTHER): Payer: Medicare Other | Attending: Internal Medicine | Admitting: Internal Medicine

## 2020-09-05 ENCOUNTER — Other Ambulatory Visit: Payer: Self-pay

## 2020-09-05 ENCOUNTER — Encounter (HOSPITAL_COMMUNITY): Payer: Self-pay

## 2020-09-05 ENCOUNTER — Emergency Department (HOSPITAL_COMMUNITY): Payer: Medicare Other

## 2020-09-05 ENCOUNTER — Inpatient Hospital Stay (HOSPITAL_COMMUNITY)
Admission: EM | Admit: 2020-09-05 | Discharge: 2020-09-17 | DRG: 092 | Disposition: A | Discharge status: Skilled Nursing Facility | Payer: Medicare Other | Attending: Student in an Organized Health Care Education/Training Program | Admitting: Student in an Organized Health Care Education/Training Program

## 2020-09-05 DIAGNOSIS — M1611 Unilateral primary osteoarthritis, right hip: Secondary | ICD-10-CM | POA: Diagnosis present

## 2020-09-05 DIAGNOSIS — E119 Type 2 diabetes mellitus without complications: Secondary | ICD-10-CM | POA: Diagnosis present

## 2020-09-05 DIAGNOSIS — G8929 Other chronic pain: Secondary | ICD-10-CM | POA: Diagnosis present

## 2020-09-05 DIAGNOSIS — R21 Rash and other nonspecific skin eruption: Secondary | ICD-10-CM | POA: Diagnosis present

## 2020-09-05 DIAGNOSIS — Z6831 Body mass index (BMI) 31.0-31.9, adult: Secondary | ICD-10-CM

## 2020-09-05 DIAGNOSIS — M4326 Fusion of spine, lumbar region: Secondary | ICD-10-CM | POA: Diagnosis present

## 2020-09-05 DIAGNOSIS — M479 Spondylosis, unspecified: Secondary | ICD-10-CM | POA: Diagnosis present

## 2020-09-05 DIAGNOSIS — L304 Erythema intertrigo: Secondary | ICD-10-CM | POA: Diagnosis present

## 2020-09-05 DIAGNOSIS — Z789 Other specified health status: Secondary | ICD-10-CM

## 2020-09-05 DIAGNOSIS — W19XXXA Unspecified fall, initial encounter: Secondary | ICD-10-CM

## 2020-09-05 DIAGNOSIS — Z8249 Family history of ischemic heart disease and other diseases of the circulatory system: Secondary | ICD-10-CM

## 2020-09-05 DIAGNOSIS — R61 Generalized hyperhidrosis: Secondary | ICD-10-CM | POA: Diagnosis present

## 2020-09-05 DIAGNOSIS — M25561 Pain in right knee: Secondary | ICD-10-CM | POA: Diagnosis present

## 2020-09-05 DIAGNOSIS — M25562 Pain in left knee: Secondary | ICD-10-CM | POA: Diagnosis present

## 2020-09-05 DIAGNOSIS — R202 Paresthesia of skin: Secondary | ICD-10-CM | POA: Diagnosis present

## 2020-09-05 DIAGNOSIS — E440 Moderate protein-calorie malnutrition: Secondary | ICD-10-CM | POA: Insufficient documentation

## 2020-09-05 DIAGNOSIS — Z833 Family history of diabetes mellitus: Secondary | ICD-10-CM

## 2020-09-05 DIAGNOSIS — R159 Full incontinence of feces: Secondary | ICD-10-CM | POA: Diagnosis not present

## 2020-09-05 DIAGNOSIS — G894 Chronic pain syndrome: Secondary | ICD-10-CM | POA: Diagnosis present

## 2020-09-05 DIAGNOSIS — Z79891 Long term (current) use of opiate analgesic: Secondary | ICD-10-CM

## 2020-09-05 DIAGNOSIS — B353 Tinea pedis: Secondary | ICD-10-CM | POA: Diagnosis present

## 2020-09-05 DIAGNOSIS — Z888 Allergy status to other drugs, medicaments and biological substances status: Secondary | ICD-10-CM

## 2020-09-05 DIAGNOSIS — E1165 Type 2 diabetes mellitus with hyperglycemia: Secondary | ICD-10-CM

## 2020-09-05 DIAGNOSIS — R296 Repeated falls: Secondary | ICD-10-CM | POA: Diagnosis not present

## 2020-09-05 DIAGNOSIS — F17210 Nicotine dependence, cigarettes, uncomplicated: Secondary | ICD-10-CM | POA: Diagnosis present

## 2020-09-05 DIAGNOSIS — R234 Changes in skin texture: Secondary | ICD-10-CM | POA: Diagnosis present

## 2020-09-05 DIAGNOSIS — M1612 Unilateral primary osteoarthritis, left hip: Secondary | ICD-10-CM | POA: Diagnosis present

## 2020-09-05 DIAGNOSIS — R634 Abnormal weight loss: Secondary | ICD-10-CM

## 2020-09-05 DIAGNOSIS — E785 Hyperlipidemia, unspecified: Secondary | ICD-10-CM | POA: Diagnosis present

## 2020-09-05 DIAGNOSIS — Y92009 Unspecified place in unspecified non-institutional (private) residence as the place of occurrence of the external cause: Secondary | ICD-10-CM

## 2020-09-05 DIAGNOSIS — M62838 Other muscle spasm: Secondary | ICD-10-CM | POA: Diagnosis present

## 2020-09-05 DIAGNOSIS — L02416 Cutaneous abscess of left lower limb: Secondary | ICD-10-CM | POA: Diagnosis present

## 2020-09-05 DIAGNOSIS — Z79899 Other long term (current) drug therapy: Secondary | ICD-10-CM

## 2020-09-05 DIAGNOSIS — Z20822 Contact with and (suspected) exposure to covid-19: Secondary | ICD-10-CM | POA: Diagnosis present

## 2020-09-05 DIAGNOSIS — F329 Major depressive disorder, single episode, unspecified: Secondary | ICD-10-CM | POA: Diagnosis present

## 2020-09-05 DIAGNOSIS — Z9181 History of falling: Secondary | ICD-10-CM

## 2020-09-05 DIAGNOSIS — I10 Essential (primary) hypertension: Secondary | ICD-10-CM | POA: Diagnosis present

## 2020-09-05 DIAGNOSIS — R42 Dizziness and giddiness: Secondary | ICD-10-CM | POA: Diagnosis present

## 2020-09-05 DIAGNOSIS — R54 Age-related physical debility: Secondary | ICD-10-CM | POA: Diagnosis present

## 2020-09-05 DIAGNOSIS — E65 Localized adiposity: Secondary | ICD-10-CM | POA: Diagnosis present

## 2020-09-05 DIAGNOSIS — R5381 Other malaise: Secondary | ICD-10-CM | POA: Diagnosis present

## 2020-09-05 DIAGNOSIS — B372 Candidiasis of skin and nail: Secondary | ICD-10-CM | POA: Diagnosis present

## 2020-09-05 DIAGNOSIS — R262 Difficulty in walking, not elsewhere classified: Secondary | ICD-10-CM

## 2020-09-05 LAB — BASIC METABOLIC PANEL
Anion gap: 9 (ref 5–15)
BUN: 27 mg/dL — ABNORMAL HIGH (ref 8–23)
CO2: 20 mmol/L — ABNORMAL LOW (ref 22–32)
Calcium: 8.6 mg/dL — ABNORMAL LOW (ref 8.9–10.3)
Chloride: 109 mmol/L (ref 98–111)
Creatinine, Ser: 1.18 mg/dL (ref 0.61–1.24)
GFR calc Af Amer: >60 mL/min (ref >60)
GFR calc non Af Amer: >60 mL/min (ref >60)
Glucose, Bld: 145 mg/dL — ABNORMAL HIGH (ref 70–99)
Potassium: 4.2 mmol/L (ref 3.5–5.1)
Sodium: 138 mmol/L (ref 135–145)

## 2020-09-05 LAB — CBC
HCT: 39.5 % (ref 39.0–52.0)
Hemoglobin: 12.3 g/dL — ABNORMAL LOW (ref 13.0–17.0)
MCH: 30.8 pg (ref 26.0–34.0)
MCHC: 31.1 g/dL (ref 30.0–36.0)
MCV: 98.8 fL (ref 80.0–100.0)
Platelets: 300 10*3/uL (ref 150–400)
RBC: 4 MIL/uL — ABNORMAL LOW (ref 4.22–5.81)
RDW: 14.6 % (ref 11.5–15.5)
WBC: 8 10*3/uL (ref 4.0–10.5)
nRBC: 0 % (ref 0.0–0.2)

## 2020-09-05 LAB — CBG MONITORING, ED: Glucose-Capillary: 103 mg/dL — ABNORMAL HIGH (ref 70–99)

## 2020-09-05 MED ORDER — OXYCODONE-ACETAMINOPHEN 5-325 MG PO TABS: 1.0000 | ORAL_TABLET | Freq: Once | ORAL | Status: DC

## 2020-09-05 MED ORDER — SODIUM CHLORIDE 0.9 % IV BOLUS
1000.0000 mL | Freq: Once | INTRAVENOUS | Status: AC
  Administered 2020-09-06: 1000 mL via INTRAVENOUS

## 2020-09-05 MED ORDER — OXYCODONE-ACETAMINOPHEN 5-325 MG PO TABS
2.0000 | ORAL_TABLET | Freq: Once | ORAL | Status: AC
  Administered 2020-09-06: 2 via ORAL
  Filled 2020-09-05: qty 2

## 2020-09-05 NOTE — ED Provider Notes (Signed)
[facility] EMERGENCY DEPARTMENT Provider Note   CSN: [id number] Arrival date & time: 09/05/20  1403     History Chief Complaint  Patient presents with   Fall   Weakness    [name] is a 64 y.o. male with a history of diabetes mellitus, HTN, HLD, left hip infection, and chronic pain syndrome who presents to the emergency department with a chief complaint of fall.  The patient reports that he was discharged from a rehab facility earlier today.  His brother was assisting him with getting out of the car when his legs suddenly became weak and he fell to the ground on his bilateral knees.  He denies hitting his head as he reached out to catch himself on the car door.  He is endorsing sharp, constant bilateral knee pain that radiates up to his bilateral hips and low back.  Pain is worse with movement.  He has been unable to ambulate since the incident that occurred just prior to arrival.  No treatment prior to arrival.  The patient was admitted from 8/9-8/15 for hypoglycemia and a purulent cellulitis of the left hip.  He was discharged to SNF.  He reports significant improvement in the pain in his left hip.  No fevers, chills, and the wound is only had minimal drainage.  Prior to this admission, he had had frequent falls while he was in a previous assisted living facility.    Reports that for the last month he has been having severe low back pain with paresthesias in the bilateral lower legs.  He also reports that he has been incontinent of stool for the last month.  No urinary incontinence.  While in the facility, he was able to ambulate with a walker, but required assistance transferring with a gait belt.  He reports that his brother is helping him set up home health, but reports that it has not been set up at that time.  He lives alone.  He also reports that he has been having severe muscle cramps for the last month and has been on 10 mg of oxycodone and taking muscle  relaxers.  Muscle cramps are worse at night.  Reports that he was having no pain into the left hip from his previous infection prior to the fall.  He denies fever, chills, abdominal pain, vomiting, diarrhea, chest pain, shortness of breath, neck pain, rash.  The history is provided by the patient and medical records. No language interpreter was used.       Past Medical History:  Diagnosis Date   Cellulitis 07/17/2020   LEFT HIP   Diabetes mellitus    Hyperlipidemia    Hypertension     Patient Active Problem List   Diagnosis Date Noted   Accident due to mechanical fall without injury 09/06/2020   Hypoglycemia 07/31/2020   Purulent Cellulitis of left hip 07/15/2020   Recurrent falls 07/15/2020   Chronic pain 07/15/2020   Frailty syndrome in geriatric patient 07/15/2020   Physical deconditioning 07/15/2020   Asymptomatic bacteriuria 07/15/2020   Diabetes mellitus (HCC) 01/28/2012    Past Surgical History:  Procedure Laterality Date   SPINE SURGERY         No family history on file.  Social History   Tobacco Use   Smoking status: Current Every Day Smoker    Packs/day: 0.50    Years: 40.00    Pack years: 20.00    Types: Cigarettes   Smokeless tobacco: Never Used  Vaping  Use   Vaping Use: Never used  Substance Use Topics   Alcohol use: No   Drug use: No    Home Medications Prior to Admission medications   Medication Sig Start Date End Date Taking? Authorizing Provider  acetaminophen (TYLENOL) 500 MG tablet Take 2 tablets (1,000 mg total) by mouth 3 (three) times daily. 08/06/20  Yes Amponsah, Flossie Buffy, MD  baclofen (LIORESAL) 10 MG tablet Take 10 mg by mouth every 8 (eight) hours.   Yes [provider]  gabapentin (NEURONTIN) 600 MG tablet Take 600 mg by mouth every 8 (eight) hours.   Yes [provider]  ibuprofen (ADVIL) 400 MG tablet Take 400 mg by mouth every 6 (six) hours as needed for moderate pain.   Yes [provider]  Nutritional Supplements (ENSURE PO) Take 1 Bottle by mouth daily with breakfast.   Yes [provider]  Oxycodone HCl 10 MG TABS Take 10 mg by mouth every 8 (eight) hours.   Yes [provider]  gabapentin (NEURONTIN) 400 MG capsule Take 1 capsule (400 mg total) by mouth 3 (three) times daily. Patient not taking: Reported on 09/06/2020 07/20/20 09/06/20  Doran Stabler, DO  oxyCODONE (OXY IR/ROXICODONE) 5 MG immediate release tablet Take 2 tablets (10 mg total) by mouth every 6 (six) hours as needed for moderate pain or severe pain. Patient not taking: Reported on 09/06/2020 08/06/20 09/06/20  Steffanie Rainwater, MD    Allergies    Lipitor [atorvastatin calcium]  Review of Systems   Review of Systems  Constitutional: Negative for appetite change and fever.  Respiratory: Negative for shortness of breath.   Cardiovascular: Negative for chest pain.  Gastrointestinal: Negative for abdominal pain and vomiting.  Genitourinary: Negative for dysuria.  Musculoskeletal: Positive for arthralgias, back pain, gait problem and myalgias. Negative for joint swelling, neck pain and neck stiffness.  Skin: Negative for rash.  Allergic/Immunologic: Negative for immunocompromised state.  Neurological: Negative for headaches.       Fecal incontinence No urinary incontinence  Psychiatric/Behavioral: Negative for confusion.    Physical Exam Updated Vital Signs BP 136/65 (BP Location: Right Arm)    Pulse 92    Temp 98.6 F (37 C) (Rectal)    Resp 18    Ht  (1.778 m)    Wt 99.8 kg    SpO2 100%    BMI 31.57 kg/m   Physical Exam Vitals and nursing note reviewed.  Constitutional:      Appearance: He is well-developed.     Comments: Chronically ill-appearing  HENT:     Head: Normocephalic.  Eyes:     Conjunctiva/sclera: Conjunctivae normal.  Cardiovascular:     Rate and Rhythm: Normal rate and regular rhythm.     Heart sounds: No murmur heard.   Pulmonary:      Effort: Pulmonary effort is normal. No respiratory distress.     Breath sounds: No stridor. No wheezing, rhonchi or rales.  Chest:     Chest wall: No tenderness.  Abdominal:     General: There is no distension.     Palpations: Abdomen is soft. There is no mass.     Tenderness: There is no abdominal tenderness. There is no right CVA tenderness, left CVA tenderness, guarding or rebound.     Hernia: No hernia is present.  Musculoskeletal:     Cervical back: Neck supple.     Comments: There is a wound to the left hip with good granuloma tissue formation.  No active drainage.  No surrounding erythema, induration, edema, warmth, red streaking.  Wound is not tender to palpation.  Tender to palpation diffusely to the bilateral patellas.  There is overlying bruising and redness, likely secondary to recent fall.  No focal medial or lateral joint line tenderness bilaterally.  Full active and passive range of motion bilaterally.  Bilateral ankles are nontender.  No focal tenderness to the bilateral hips.  Tender to palpation to the spinous processes of the lumbar spine and posterior pelvis.  No crepitus or step-offs.  Cervical and thoracic spine are nontender.  Sensation is intact and symmetric throughout the bilateral upper and lower extremities.  4 out of 5 strength against resistance of the left and 5-5 strength against resistance of the right.  Skin:    General: Skin is warm and dry.     Capillary Refill: Capillary refill takes less than 2 seconds.     Coloration: Skin is not jaundiced.  Neurological:     Mental Status: He is alert.  Psychiatric:        Behavior: Behavior normal.     ED Results / Procedures / Treatments   Labs (all labs ordered are listed, but only abnormal results are displayed) Labs Reviewed  BASIC METABOLIC PANEL - Abnormal; Notable for the following components:      Result Value   CO2 20 (*)    Glucose, Bld 145 (*)    BUN 27 (*)    Calcium 8.6 (*)    All other  components within normal limits  CBC - Abnormal; Notable for the following components:   RBC 4.00 (*)    Hemoglobin 12.3 (*)    All other components within normal limits  URINALYSIS, ROUTINE W REFLEX MICROSCOPIC - Abnormal; Notable for the following components:   APPearance HAZY (*)    Leukocytes,Ua SMALL (*)    Bacteria, UA RARE (*)    All other components within normal limits  COMPREHENSIVE METABOLIC PANEL - Abnormal; Notable for the following components:   Glucose, Bld 131 (*)    BUN 25 (*)    Albumin 3.3 (*)    All other components within normal limits  CBC WITH DIFFERENTIAL/PLATELET - Abnormal; Notable for the following components:   MCV 102.2 (*)    All other components within normal limits  CBG MONITORING, ED - Abnormal; Notable for the following components:   Glucose-Capillary 103 (*)    All other components within normal limits  CBG MONITORING, ED - Abnormal; Notable for the following components:   Glucose-Capillary 110 (*)    All other components within normal limits  CK  MAGNESIUM    EKG EKG Interpretation  Date/Time:  Wednesday September 05 2020 14:07:41 EDT Ventricular Rate:  113 PR Interval:  146 QRS Duration: 74 QT Interval:  328 QTC Calculation: 449 R Axis:   85 Text Interpretation: Sinus tachycardia with Premature atrial complexes Possible Anterior infarct , age undetermined Abnormal ECG No sig change from prior tracing, no STEMI Confirmed by Alvester Chourifan, Matthew [phone]([id number]) on 09/05/2020 9:48:55 PM   Radiology MR LUMBAR SPINE WO CONTRAST  Result Date: 09/06/2020 CLINICAL DATA:  Low back pain with progressive neurologic deficit EXAM: MRI LUMBAR SPINE WITHOUT CONTRAST TECHNIQUE: Multiplanar, multisequence MR imaging of the lumbar spine was performed. No intravenous contrast was administered. COMPARISON:  07/16/2008 FINDINGS: Segmentation:  5 lumbar type vertebrae Alignment:  Straightening of the lumbar spine. Vertebrae: Benign heterogeneity of marrow that is  generalized. There are levels of increased T2 signal within  the disc space associated with bridging endplate osteophytes and attributed to ankylosis. Most notably this is seen at T11-12, L3-4, and L4-5. No acute fracture. No focal bone lesion. Conus medullaris and cauda equina: Conus extends to the L1-2 level. Conus and cauda equina appear normal. Paraspinal and other soft tissues: Minimally covered right upper pole cystic intensity. Generalized body wall edema where covered. Disc levels: T12- L1: Spondylosis with possible bridging. L1-L2: Spondylosis. L2-L3: Spondylosis with bulky left left ventral and right far-lateral spurring, the latter encroaching on the exiting L2 nerve root to a prominent degree. The canal is patent L3-L4: Spondylosis with bridging osteophytes. The canal and foramina are patent L4-L5: Spondylosis with bulky bridging osteophytes. Mild thecal sac narrowing. Patent foramina L5-S1:Facet osteoarthritis with moderate hypertrophy. Spondylosis with disc bulging and a small protrusion and annular fissure that is left paracentral. Left subarticular recess narrowing that could affect the left descending S1 nerve root. IMPRESSION: 1. No acute finding. 2. 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 A bulky right far-lateral spur at L2-3 impinges on the right L2 nerve root. 3. L5-S1 left subarticular recess narrowing that could affect the left S1 nerve root. 4. Diffusely patent spinal canal. Electronically Signed   By: Marnee Spring M.D.   On: 09/06/2020 04:39   DG Knee Complete 4 Views Left  Result Date: 09/06/2020 CLINICAL DATA:  Generalized weakness, bilateral lower extremity numbness after fall EXAM: LEFT KNEE - COMPLETE 4+ VIEW COMPARISON:  07/15/2020 FINDINGS: Frontal, bilateral oblique, and cross-table lateral views of the left knee are obtained. There is 3 compartmental osteoarthritis greatest in the medial compartment. No acute displaced fracture. Trace  joint effusion. Soft tissues are otherwise unremarkable. IMPRESSION: 1. Three compartmental osteoarthritis with likely reactive trace joint effusion. 2. No acute bony abnormality. Electronically Signed   By: Sharlet Salina M.D.   On: 09/06/2020 00:07   DG Knee Complete 4 Views Right  Result Date: 09/06/2020 CLINICAL DATA:  Lower extremity numbness, fell EXAM: RIGHT KNEE - COMPLETE 4+ VIEW COMPARISON:  None FINDINGS: Frontal, bilateral oblique, lateral views of the right knee are obtained. There is 3 compartmental osteoarthritis greatest in the medial compartment. No fracture, subluxation, or dislocation. Soft tissues are unremarkable. IMPRESSION: 1. Three compartmental osteoarthritis.  No acute bony abnormality. Electronically Signed   By: Sharlet Salina M.D.   On: 09/06/2020 00:10   DG Hip Unilat W or Wo Pelvis 2-3 Views Left  Result Date: 09/06/2020 CLINICAL DATA:  Larey Seat, left hip pain EXAM: DG HIP (WITH OR WITHOUT PELVIS) 2-3V LEFT COMPARISON:  07/31/2020 FINDINGS: Frontal view of the pelvis as well as frontal and frogleg lateral views of the left hip are obtained. Stable postsurgical changes from prior acetabular ORIF. There is severe left hip osteoarthritis with marked joint space narrowing and osteophyte formation. No acute displaced fracture, subluxation, or dislocation. Severe lower lumbar spondylosis. IMPRESSION: 1. Stable left hip osteoarthritis.  No acute fracture. Electronically Signed   By: Sharlet Salina M.D.   On: 09/06/2020 00:09   DG Hip Unilat W or Wo Pelvis 2-3 Views Right  Result Date: 09/06/2020 CLINICAL DATA:  Right hip pain after fall, numbness EXAM: DG HIP (WITH OR WITHOUT PELVIS) 2-3V RIGHT COMPARISON:  06/12/2008 FINDINGS: Frontal and frogleg lateral views of the right hip are obtained. There is mild osteoarthritis of the right hip with joint space narrowing and osteophyte formation. No acute displaced fracture. Visualized portions of the bony pelvis are unremarkable. IMPRESSION:  1. Right hip osteoarthritis.  No  acute fracture. Electronically Signed   By: Sharlet Salina M.D.   On: 09/06/2020 00:08    Procedures Procedures (including critical care time)  Medications Ordered in ED Medications  enoxaparin (LOVENOX) injection 40 mg (has no administration in time range)  acetaminophen (TYLENOL) tablet 650 mg (has no administration in time range)    Or  acetaminophen (TYLENOL) suppository 650 mg (has no administration in time range)  polyethylene glycol (MIRALAX / GLYCOLAX) packet 17 g (has no administration in time range)  insulin aspart (novoLOG) injection 0-15 Units (0 Units Subcutaneous Not Given 09/06/20 0812)  baclofen (LIORESAL) tablet 5 mg (has no administration in time range)  oxyCODONE (Oxy IR/ROXICODONE) immediate release tablet 10 mg (has no administration in time range)  (feeding supplement) PROSource Plus liquid 30 mL (has no administration in time range)  nystatin (MYCOSTATIN/NYSTOP) topical powder (has no administration in time range)  triamcinolone 0.1 % cream : eucerin cream, 1:1 (has no administration in time range)  sodium chloride 0.9 % bolus 1,000 mL (0 mLs Intravenous Stopped 09/06/20 0231)  oxyCODONE-acetaminophen (PERCOCET/ROXICET) 5-325 MG per tablet 2 tablet (2 tablets Oral Given 09/06/20 0045)  LORazepam (ATIVAN) injection 1 mg (1 mg Intravenous Given 09/06/20 0232)  nystatin (MYCOSTATIN/NYSTOP) topical powder ( Topical Given 09/06/20 0320)    ED Course  I have reviewed the triage vital signs and the nursing notes.  Pertinent labs & imaging results that were available during my care of the patient were reviewed by me and considered in my medical decision making (see chart for details).    MDM Rules/Calculators/A&P                          64 year old male with a history of diabetes mellitus, HTN, HLD, left hip infection, and chronic pain syndrome presenting after fall earlier tonight.  Reports that his legs became weak while getting out of  the car after being discharged from a rehab facility.  He has been having severe low back pain, myalgias, paresthesias, and fecal incontinence for the last month.  Vital signs are reassuring.  The patient was seen and independently evaluated by Dr. Manus Gunning, attending physician.  X-rays are negative for fracture.  Labs are overall reassuring.  There is concern for spinal cord compression given the patient's symptoms over the last month. Dr. Manus Gunning recommends MRI of the lumbar spine, which demonstrated generalized spondylosis that is progressed over the last decade with multilevel ankylosis.  There is also some subarticular recess narrowing of L5-S1 that could affect the left S1 nerve root.  Spinal canal is diffusely patent.  Patient reports that home health has not been finalized prior to his discharge from rehab.  He lives alone.  He is required ambulation with a walker during his rehab stay.  In the ER, patient is in severe pain and is unable to stand for orthostatic vital signs.  Pain treated in the ER.  Given his progressively worsening spondylosis and ankylosis in the setting of the patient not being able to ambulate, consulted the internal medicine residency team for admission.  Internal medicine residency team will accept the patient for admission.  The patient appears reasonably stabilized for admission considering the current resources, flow, and capabilities available in the ED at this time, and I doubt any other [facility] requiring further screening and/or treatment in the ED prior to admission.   Final Clinical Impression(s) / ED Diagnoses Final diagnoses:  Ankylosis of lumbar spine  Deterioration in ability to  walk    Rx / DC Orders ED Discharge Orders    None       Barkley Boards, PA-C 09/06/20 0935    Glynn Octave, MD 09/06/20 1442

## 2020-09-05 NOTE — ED Notes (Signed)
Orthostatics will not be performed on this patient. Patient stated that he cannot stand up due prior illness. PA was notified.

## 2020-09-05 NOTE — ED Triage Notes (Signed)
Pt from home with ems for generalized weakness, numbness in bilateral legs leading up to a fall, no injuries from fall. Pt just discharged from Rehab facility today. Pt alert, oriented. NAD noted

## 2020-09-06 ENCOUNTER — Inpatient Hospital Stay (HOSPITAL_COMMUNITY): Payer: Medicare Other

## 2020-09-06 ENCOUNTER — Emergency Department (HOSPITAL_COMMUNITY): Payer: Medicare Other

## 2020-09-06 DIAGNOSIS — M6281 Muscle weakness (generalized): Secondary | ICD-10-CM | POA: Diagnosis not present

## 2020-09-06 DIAGNOSIS — G6289 Other specified polyneuropathies: Secondary | ICD-10-CM | POA: Diagnosis not present

## 2020-09-06 DIAGNOSIS — Y92009 Unspecified place in unspecified non-institutional (private) residence as the place of occurrence of the external cause: Secondary | ICD-10-CM | POA: Diagnosis not present

## 2020-09-06 DIAGNOSIS — R531 Weakness: Secondary | ICD-10-CM | POA: Diagnosis not present

## 2020-09-06 DIAGNOSIS — W19XXXA Unspecified fall, initial encounter: Secondary | ICD-10-CM

## 2020-09-06 DIAGNOSIS — E119 Type 2 diabetes mellitus without complications: Secondary | ICD-10-CM | POA: Diagnosis present

## 2020-09-06 DIAGNOSIS — Z20822 Contact with and (suspected) exposure to covid-19: Secondary | ICD-10-CM | POA: Diagnosis present

## 2020-09-06 DIAGNOSIS — L02416 Cutaneous abscess of left lower limb: Secondary | ICD-10-CM | POA: Diagnosis present

## 2020-09-06 DIAGNOSIS — M1612 Unilateral primary osteoarthritis, left hip: Secondary | ICD-10-CM | POA: Diagnosis present

## 2020-09-06 DIAGNOSIS — B372 Candidiasis of skin and nail: Secondary | ICD-10-CM | POA: Diagnosis present

## 2020-09-06 DIAGNOSIS — R159 Full incontinence of feces: Secondary | ICD-10-CM | POA: Diagnosis present

## 2020-09-06 DIAGNOSIS — M25561 Pain in right knee: Secondary | ICD-10-CM | POA: Diagnosis present

## 2020-09-06 DIAGNOSIS — E440 Moderate protein-calorie malnutrition: Secondary | ICD-10-CM | POA: Diagnosis present

## 2020-09-06 DIAGNOSIS — B353 Tinea pedis: Secondary | ICD-10-CM | POA: Diagnosis present

## 2020-09-06 DIAGNOSIS — R634 Abnormal weight loss: Secondary | ICD-10-CM | POA: Diagnosis not present

## 2020-09-06 DIAGNOSIS — M1611 Unilateral primary osteoarthritis, right hip: Secondary | ICD-10-CM | POA: Diagnosis present

## 2020-09-06 DIAGNOSIS — I10 Essential (primary) hypertension: Secondary | ICD-10-CM | POA: Diagnosis present

## 2020-09-06 DIAGNOSIS — R296 Repeated falls: Secondary | ICD-10-CM | POA: Diagnosis present

## 2020-09-06 DIAGNOSIS — F17210 Nicotine dependence, cigarettes, uncomplicated: Secondary | ICD-10-CM | POA: Diagnosis present

## 2020-09-06 DIAGNOSIS — M4326 Fusion of spine, lumbar region: Secondary | ICD-10-CM | POA: Diagnosis present

## 2020-09-06 DIAGNOSIS — L304 Erythema intertrigo: Secondary | ICD-10-CM | POA: Diagnosis present

## 2020-09-06 DIAGNOSIS — E785 Hyperlipidemia, unspecified: Secondary | ICD-10-CM | POA: Diagnosis present

## 2020-09-06 DIAGNOSIS — M62838 Other muscle spasm: Secondary | ICD-10-CM | POA: Diagnosis present

## 2020-09-06 DIAGNOSIS — E65 Localized adiposity: Secondary | ICD-10-CM | POA: Diagnosis present

## 2020-09-06 DIAGNOSIS — R61 Generalized hyperhidrosis: Secondary | ICD-10-CM | POA: Diagnosis present

## 2020-09-06 DIAGNOSIS — M25562 Pain in left knee: Secondary | ICD-10-CM | POA: Diagnosis present

## 2020-09-06 DIAGNOSIS — R54 Age-related physical debility: Secondary | ICD-10-CM | POA: Diagnosis present

## 2020-09-06 DIAGNOSIS — G894 Chronic pain syndrome: Secondary | ICD-10-CM | POA: Diagnosis present

## 2020-09-06 DIAGNOSIS — F329 Major depressive disorder, single episode, unspecified: Secondary | ICD-10-CM | POA: Diagnosis present

## 2020-09-06 DIAGNOSIS — R42 Dizziness and giddiness: Secondary | ICD-10-CM | POA: Diagnosis present

## 2020-09-06 DIAGNOSIS — R234 Changes in skin texture: Secondary | ICD-10-CM | POA: Diagnosis not present

## 2020-09-06 LAB — CBC WITH DIFFERENTIAL/PLATELET
Abs Immature Granulocytes: 0.03 10*3/uL (ref 0.00–0.07)
Basophils Absolute: 0 10*3/uL (ref 0.0–0.1)
Basophils Relative: 0 %
Eosinophils Absolute: 0.5 10*3/uL (ref 0.0–0.5)
Eosinophils Relative: 5 %
HCT: 47.1 % (ref 39.0–52.0)
Hemoglobin: 14.6 g/dL (ref 13.0–17.0)
Immature Granulocytes: 0 %
Lymphocytes Relative: 17 %
Lymphs Abs: 1.7 10*3/uL (ref 0.7–4.0)
MCH: 31.7 pg (ref 26.0–34.0)
MCHC: 31 g/dL (ref 30.0–36.0)
MCV: 102.2 fL — ABNORMAL HIGH (ref 80.0–100.0)
Monocytes Absolute: 0.7 10*3/uL (ref 0.1–1.0)
Monocytes Relative: 7 %
Neutro Abs: 7 10*3/uL (ref 1.7–7.7)
Neutrophils Relative %: 71 %
Platelets: 355 10*3/uL (ref 150–400)
RBC: 4.61 MIL/uL (ref 4.22–5.81)
RDW: 15.1 % (ref 11.5–15.5)
WBC: 9.9 10*3/uL (ref 4.0–10.5)
nRBC: 0 % (ref 0.0–0.2)

## 2020-09-06 LAB — COMPREHENSIVE METABOLIC PANEL
ALT: 16 U/L (ref 0–44)
AST: 20 U/L (ref 15–41)
Albumin: 3.3 g/dL — ABNORMAL LOW (ref 3.5–5.0)
Alkaline Phosphatase: 98 U/L (ref 38–126)
Anion gap: 10 (ref 5–15)
BUN: 25 mg/dL — ABNORMAL HIGH (ref 8–23)
CO2: 22 mmol/L (ref 22–32)
Calcium: 9.2 mg/dL (ref 8.9–10.3)
Chloride: 106 mmol/L (ref 98–111)
Creatinine, Ser: 1.15 mg/dL (ref 0.61–1.24)
GFR calc Af Amer: >60 mL/min (ref >60)
GFR calc non Af Amer: >60 mL/min (ref >60)
Glucose, Bld: 131 mg/dL — ABNORMAL HIGH (ref 70–99)
Potassium: 4.5 mmol/L (ref 3.5–5.1)
Sodium: 138 mmol/L (ref 135–145)
Total Bilirubin: 0.9 mg/dL (ref 0.3–1.2)
Total Protein: 7.1 g/dL (ref 6.5–8.1)

## 2020-09-06 LAB — URINALYSIS, ROUTINE W REFLEX MICROSCOPIC
Bilirubin Urine: NEGATIVE
Glucose, UA: NEGATIVE mg/dL
Hgb urine dipstick: NEGATIVE
Ketones, ur: NEGATIVE mg/dL
Nitrite: NEGATIVE
Protein, ur: NEGATIVE mg/dL
Specific Gravity, Urine: 1.029 (ref 1.005–1.030)
pH: 5 (ref 5.0–8.0)

## 2020-09-06 LAB — CBG MONITORING, ED
Glucose-Capillary: 110 mg/dL — ABNORMAL HIGH (ref 70–99)
Glucose-Capillary: 104 mg/dL — ABNORMAL HIGH (ref 70–99)
Glucose-Capillary: 125 mg/dL — ABNORMAL HIGH (ref 70–99)

## 2020-09-06 LAB — MAGNESIUM: Magnesium: 2 mg/dL (ref 1.7–2.4)

## 2020-09-06 LAB — CK: Total CK: 63 U/L (ref 49–397)

## 2020-09-06 MED ORDER — OXYCODONE HCL 5 MG PO TABS
10.0000 mg | ORAL_TABLET | Freq: Four times a day (QID) | ORAL | Status: DC | PRN
  Administered 2020-09-06 – 2020-09-17 (×34): 10 mg via ORAL
  Filled 2020-09-06 (×35): qty 2

## 2020-09-06 MED ORDER — ENOXAPARIN SODIUM 40 MG/0.4ML ~~LOC~~ SOLN
40.0000 mg | SUBCUTANEOUS | Status: DC
  Administered 2020-09-06 – 2020-09-17 (×12): 40 mg via SUBCUTANEOUS
  Filled 2020-09-06 (×12): qty 0.4

## 2020-09-06 MED ORDER — LORAZEPAM 2 MG/ML IJ SOLN
1.0000 mg | Freq: Once | INTRAMUSCULAR | Status: AC
  Administered 2020-09-06: 1 mg via INTRAVENOUS
  Filled 2020-09-06: qty 1

## 2020-09-06 MED ORDER — PROSOURCE PLUS PO LIQD
30.0000 mL | Freq: Three times a day (TID) | ORAL | Status: DC
  Administered 2020-09-07 – 2020-09-10 (×9): 30 mL via ORAL
  Filled 2020-09-06 (×11): qty 30

## 2020-09-06 MED ORDER — ACETAMINOPHEN 325 MG PO TABS
650.0000 mg | ORAL_TABLET | Freq: Four times a day (QID) | ORAL | Status: DC | PRN
  Administered 2020-09-07 – 2020-09-16 (×8): 650 mg via ORAL
  Filled 2020-09-06 (×8): qty 2

## 2020-09-06 MED ORDER — INSULIN ASPART 100 UNIT/ML ~~LOC~~ SOLN
0.0000 [IU] | Freq: Three times a day (TID) | SUBCUTANEOUS | Status: DC
  Administered 2020-09-07: 3 [IU] via SUBCUTANEOUS
  Administered 2020-09-08 – 2020-09-11 (×4): 2 [IU] via SUBCUTANEOUS
  Administered 2020-09-12 – 2020-09-14 (×3): 3 [IU] via SUBCUTANEOUS
  Administered 2020-09-14: 1 [IU] via SUBCUTANEOUS
  Administered 2020-09-15: 3 [IU] via SUBCUTANEOUS
  Administered 2020-09-17: 2 [IU] via SUBCUTANEOUS

## 2020-09-06 MED ORDER — NYSTATIN 100000 UNIT/GM EX POWD
Freq: Two times a day (BID) | CUTANEOUS | Status: DC
  Filled 2020-09-06: qty 15

## 2020-09-06 MED ORDER — PRO-STAT SUGAR FREE PO LIQD
30.0000 mL | Freq: Three times a day (TID) | ORAL | Status: DC
  Filled 2020-09-06 (×2): qty 30

## 2020-09-06 MED ORDER — TRIAMCINOLONE 0.1 % CREAM:EUCERIN CREAM 1:1
TOPICAL_CREAM | Freq: Three times a day (TID) | CUTANEOUS | Status: DC | PRN
  Filled 2020-09-06: qty 1

## 2020-09-06 MED ORDER — POLYETHYLENE GLYCOL 3350 17 G PO PACK
17.0000 g | PACK | ORAL | Status: DC
  Administered 2020-09-08 – 2020-09-17 (×9): 17 g via ORAL
  Filled 2020-09-06 (×10): qty 1

## 2020-09-06 MED ORDER — ACETAMINOPHEN 650 MG RE SUPP: 650.0000 mg | Freq: Four times a day (QID) | RECTAL | Status: DC | PRN

## 2020-09-06 MED ORDER — NYSTATIN 100000 UNIT/GM EX POWD
Freq: Once | CUTANEOUS | Status: AC
  Filled 2020-09-06: qty 15

## 2020-09-06 MED ORDER — BACLOFEN 10 MG PO TABS
5.0000 mg | ORAL_TABLET | Freq: Two times a day (BID) | ORAL | Status: DC
  Administered 2020-09-06 – 2020-09-07 (×3): 5 mg via ORAL
  Filled 2020-09-06 (×4): qty 1

## 2020-09-06 MED ORDER — POLYETHYLENE GLYCOL 3350 17 G PO PACK: 17.0000 g | PACK | Freq: Every day | ORAL | Status: DC | PRN

## 2020-09-06 NOTE — H&P (Signed)
Date: 09/06/2020               Patient Name:  [name] MRN: [id number]  DOB: 08/11/1956 Age / Sex: 64 y.o., male   PCP: Patient, No Pcp Per         Medical Service: Internal Medicine Teaching Service         Attending Physician: Dr. Mayford Knife, Dorene Ar, MD    First Contact: Miachel Roux MS4 Pager: [phone]  Second Contact: Dr. Gwyneth Revels Pager: [phone]       After Hours (After 5p/  First Contact Pager: [phone]  weekends / holidays): Second Contact Pager: [phone]   Chief Complaint: Fall  History of Present Illness:   [name] is a 64 year old male with PMHx of diabetes mellitus, hypertension, hyperlipidemia, recent left hip infection and chronic pain syndrome presenting following a fall. History obtained from patient and chart review.   Patient states he was getting out of his brothers truck today when he fell onto his knees. He endorses the reason for falling being the weakness in his legs that he has had the past two months. He notes dizziness prior to the episode. He denies any loss of consciousness. He denies hitting his head. He denies any new pain or injuries at this time. He states after the fall today he felt numbness in the front of his leg that has improved since arriving at the ED. The patient had recently been discharged from a nursing home and was going home when this occurred. He notes the lower extremity weakness has been consistent for the past two months, but has noticed worsening since falling twice recently.   He also endorses approximately one month of fecal incontinence (twice daily) due to numbness in the perianal region associated with weakness in the back side of his legs extending into the bottom of his feet. Currently endorsing numbness along the front of his legs and the bottom of his feet.  He notes well healing wound of left lateral lower extremity, he endorses minimal drainage. Denies pain or increased discharge from the wound.   Denies  any chest pain, palpitations, paresthesias, fevers/chills, chest pain, palpitations, abdominal pain or distension, nausea/vomiting, headaches, or vision changes.   Meds:  No outpatient medications have been marked as taking for the 09/05/20 encounter [facility] Encounter).    Allergies: Allergies as of 09/05/2020 - Review Complete 09/05/2020  Allergen Reaction Noted  . Lipitor [atorvastatin calcium] Other (See Comments) 01/28/2012   Past Medical History:  Diagnosis Date  . Cellulitis 07/17/2020   LEFT HIP  . Diabetes mellitus   . Hyperlipidemia   . Hypertension     Family History:  No family history on file. Father - died of MI at 3, diabetes, hypertension Mother - hypertension Brother - diabetes, hypertension Maternal uncle - throat cancer  Social History: Smokes 1/2 ppd x 15 years Smoking marijuana one joint/night x 10-15 years  Review of Systems: A complete ROS was negative except as per HPI.   Physical Exam: Blood pressure 136/65, pulse 92, temperature 98.6 F (37 C), temperature source Rectal, resp. rate 18, height 5\' 10"  (1.778 m), weight 99.8 kg, SpO2 100 %. Physical Exam Vitals and nursing note reviewed.  Constitutional:      General: He is not in acute distress.    Appearance: He is obese. He is not ill-appearing, toxic-appearing or diaphoretic.  HENT:     Head: Normocephalic and atraumatic.  Eyes:  Extraocular Movements: Extraocular movements intact.  Cardiovascular:     Rate and Rhythm: Normal rate and regular rhythm.     Pulses: Normal pulses.     Heart sounds: Normal heart sounds. No murmur heard.  No gallop.   Pulmonary:     Effort: Pulmonary effort is normal. No respiratory distress.     Breath sounds: Normal breath sounds. No stridor. No wheezing, rhonchi or rales.  Abdominal:     General: Abdomen is flat. There is no distension.     Tenderness: There is no abdominal tenderness.     Comments: Rash under abdominal skin fold. Power application  applied by ED staff present.   Musculoskeletal:     Cervical back: Normal range of motion.     Right lower leg: No edema.     Left lower leg: No edema.  Skin:    Comments: Sloughing of skin bilateral beet on plantar aspect of feet.   Patient with erythematous rash on chest, abdomen, extremities. Patient notes area has itched for the past few days.   Powder present under patient's abdominal skin fold and around groin, notes applied by ED staff.  Patient with well healing wound, 3-4 cm with well healing scar down lateral aspect of left thigh.    Neurological:     General: No focal deficit present.     Mental Status: He is alert and oriented to person, place, and time.     Comments: Weakness of left lower extremity, patient notes weakness since leg wound.   Right lower extremity 5/5/ strength  Psychiatric:        Mood and Affect: Mood normal.        Behavior: Behavior normal.             EKG: personally reviewed my interpretation is sinus tachycardia with PAC's  XRAY RIGHT AND LEFT KNEE: IMPRESSION: 1. Three compartmental osteoarthritis with likely reactive trace joint effusion. 2. No acute bony abnormality.  XRAY HIP: IMPRESSION: 1. Stable left hip osteoarthritis.  No acute fracture. 2. Right hip osteoarthritis.  No acute fracture.  Walter LUMBAR SPINE WO CONTRAST:  IMPRESSION: 1. No acute finding. 2. 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 A bulky right far-lateral spur at L2-3 impinges on the right L2 nerve root. 3. L5-S1 left subarticular recess narrowing that could affect the left S1 nerve root. 4. Diffusely patent spinal canal.  Assessment & Plan by Problem: Active Problems:   Accident due to mechanical fall without injury  Walter [name] is a 64 year old male with PMHx of hypertension, diabetes mellitus, hyperlipidemia, recent left upper thigh abscess s/p I&D, chronic pain syndrome presenting after a mechanical  fall without injury. Endorses one month history of bilateral lower extremity weakness with fecal incontinence.  Fall Patient presents after a witnessed fall from standing height and landed on knees. He denies any injury. Felt dizzy prior to falling but attributes this to tobacco use as he smoked a cigarette. Denies head trauma or LOC. Imaging negative for any acute fractures. Fall is likely mechanical given his lower extremity weakness.  Due to worsening weakness and fecal incontinence (?acute vs chronic), will consider neurosurgery consult as MRI findings suggestive of progressive spondylosis and possible compression of S1 nerve root due to narrowing of L5-S1 subarticular recess.  - PT/OT evaluation - Will likely need SNF placement  - Consider steroids if true compression   Bilateral plantar skin sloughing Diffuse eczematous rash  Intretigo in groin area Patient  endorses that this has been present over the past month but has not been evaluated by podiatry or dermatology. Diffuse skin rash noted on core, concerning for eczema vs allergic dermatitis.  - Consider podiatry consult for plantar skin sloughing and foot care - Nystatin powder for intretigo - Eucerin cream for diffuse eczema - Consider biopsy of skin rash if no significant improvement  LLE thigh wound: Well-healing left thigh wound and surgical scar. No active drainage, erythema or tenderness surrounding the area. No signs of cellulitis at this time.  -Consult to wound care  - Monitor for any signs of infection  Chronic pain syndrome Patient has history of chronic pain for which he is on baclofen and oxycodone IR.  - Continue home medications  Diabetes mellitus: Most recent HbA1c 6.9. Patient is not on any diabetic medication at home. CBG >100 thus far. - CBG monitoring - SSI tid with meals   Dispo: Admit patient to Inpatient with expected length of stay greater than 2 midnights.  SignedBelva Agee,  MD 09/06/2020, 6:40 AM  Pager: [phone] After 5pm on weekdays and 1pm on weekends: On Call pager: [phone]

## 2020-09-06 NOTE — Consult Note (Signed)
WOC Nurse Consult Note: Patient receiving care in [facility] ED 25. Consult completed remotely after review of record, imaging results, and photo of left hip wound. Reason for Consult: LLE wound Wound type: chronic, non-healing left hip wound along prior incision line Pressure Injury POA: Yes/No/NA Measurement: To be provided by the bedside RN in the flowsheet section Wound bed: adjacent shallow ulcer base in pink Drainage (amount, consistency, odor) observed on photo Periwound: intact, healed incision line Dressing procedure/placement/frequency:  Cleanse left hip wound with soap and water. Pat dry. Using a cotton tipped applicator, insert 1/4" Iodoform packing strip [place] [phone]) into the left hip wound. Leave a portion hanging out for easy removal. Cover with ABD pad, tape in place. Change daily. Thank you for the consult.  Discussed plan of care with the patient and bedside nurse.  WOC nurse will not follow at this time.  Please re-consult the WOC team if needed.  Helmut Muster, RN, MSN, CWOCN, CNS-BC, pager [phone]

## 2020-09-06 NOTE — Progress Notes (Signed)
[name] is a 64 y.o. male patient admitted from ED awake, alert - oriented  X 4 - no acute distress noted.  VSS - Blood pressure (!) 154/82, pulse (!) 102, temperature 98 F (36.7 C), temperature source Oral, resp. rate 16, height 5\' 10"  (1.778 m), weight 99.8 kg, SpO2 100 %.    IV in place, occlusive dsg intact without redness.    Will cont to eval and treat per MD orders.  , RN 09/06/2020 2230

## 2020-09-06 NOTE — Evaluation (Signed)
Physical Therapy Evaluation Patient Details Name: [name] MRN: [id number] DOB: 09-Jun-1956 Today's Date: 09/06/2020   History of Present Illness  Pt is a 64 y/o male admitted secondary to fall and increased weakness after being d/c'd from SNF. Pt with L hip wound as well. PMH includes DM and HTN.   Clinical Impression  Pt admitted secondary to problem above with deficits below. Pt requiring mod A +2 for bed mobility and to stand at EOB. Pt reporting dizziness, however, VSS throughout. Was unable to take steps this session. Feel pt is at increased risk for falls and will require SNF level therapies at d/c. Will continue to follow acutely to maximize functional mobility independence and safety.     Follow Up Recommendations SNF;Supervision/Assistance - 24 hour    Equipment Recommendations  None recommended by PT    Recommendations for Other Services       Precautions / Restrictions Precautions Precautions: Fall Restrictions Weight Bearing Restrictions: No      Mobility  Bed Mobility Overal bed mobility: Needs Assistance Bed Mobility: Supine to Sit;Sit to Supine     Supine to sit: Mod assist;+2 for physical assistance Sit to supine: Mod assist;+2 for physical assistance   General bed mobility comments: Mod A +2 for trunk and LE assist throughout bed mobility. Increased time required. Reports some dizziness, but BP WFL.   Transfers Overall transfer level: Needs assistance Equipment used: Rolling walker (2 wheeled) Transfers: Sit to/from Stand Sit to Stand: Mod assist;+2 physical assistance         General transfer comment: Mod A +2 for lift assist and steadying to stand. Pt reporting increased dizziness and was unable to take steps at EOB. Returned to supine.   Ambulation/Gait                Stairs            Wheelchair Mobility    Modified Rankin (Stroke Patients Only)       Balance Overall balance assessment: Needs  assistance Sitting-balance support: No upper extremity supported;Feet supported Sitting balance-Leahy Scale: Fair     Standing balance support: Bilateral upper extremity supported;During functional activity Standing balance-Leahy Scale: Poor Standing balance comment: Reliant on BUE support and external support.                              Pertinent Vitals/Pain Pain Assessment: Faces Faces Pain Scale: Hurts even more Pain Location: back Pain Descriptors / Indicators: Grimacing;Guarding Pain Intervention(s): Limited activity within patient's tolerance;Monitored during session;Repositioned    Home Living Family/patient expects to be discharged to:: Private residence Living Arrangements: Alone Available Help at Discharge: Family;Available PRN/intermittently Type of Home: Mobile home Home Access: Stairs to enter Entrance Stairs-Rails: Right;Left Entrance Stairs-Number of Steps: 3 Home Layout: One level Home Equipment: Walker - 2 wheels;Shower seat;Wheelchair - manual;Bedside commode      Prior Function Level of Independence: Needs assistance   Gait / Transfers Assistance Needed: Was walking with RW when he left SNF.   ADL's / Homemaking Assistance Needed: Needed help with bathing and dressing at SNF.         Hand Dominance        Extremity/Trunk Assessment   Upper Extremity Assessment Upper Extremity Assessment: Defer to OT evaluation    Lower Extremity Assessment Lower Extremity Assessment: LLE deficits/detail;Generalized weakness LLE Deficits / Details: L hip wound at baseline. Notably weaker when performing SLR. Minimal clearance from bed when  performing SLR.     Cervical / Trunk Assessment Cervical / Trunk Assessment: Kyphotic  Communication   Communication: No difficulties  Cognition Arousal/Alertness: Awake/alert Behavior During Therapy: WFL for tasks assessed/performed Overall Cognitive Status: No family/caregiver present to determine baseline  cognitive functioning                                        General Comments      Exercises     Assessment/Plan    PT Assessment Patient needs continued PT services  PT Problem List Decreased strength;Decreased activity tolerance;Decreased balance;Decreased mobility;Decreased knowledge of use of DME;Decreased knowledge of precautions       PT Treatment Interventions DME instruction;Gait training;Functional mobility training;Therapeutic activities;Balance training;Therapeutic exercise;Patient/family education    PT Goals (Current goals can be found in the Care Plan section)  Acute Rehab PT Goals Patient Stated Goal: to feel better PT Goal Formulation: With patient Time For Goal Achievement: 09/20/20 Potential to Achieve Goals: Good    Frequency Min 2X/week   Barriers to discharge Decreased caregiver support      Co-evaluation               AM-PAC PT "6 Clicks" Mobility  Outcome Measure Help needed turning from your back to your side while in a flat bed without using bedrails?: A Lot Help needed moving from lying on your back to sitting on the side of a flat bed without using bedrails?: A Lot Help needed moving to and from a bed to a chair (including a wheelchair)?: A Lot Help needed standing up from a chair using your arms (e.g., wheelchair or bedside chair)?: A Lot Help needed to walk in hospital room?: Total Help needed climbing 3-5 steps with a railing? : Total 6 Click Score: 10    End of Session Equipment Utilized During Treatment: Gait belt Activity Tolerance: Treatment limited secondary to medical complications (Comment) (dizziness) Patient left: in bed;with call bell/phone within reach (on stretcher in ED ) Nurse Communication: Mobility status;Other (comment) (pt wanting breakfast) PT Visit Diagnosis: Unsteadiness on feet (R26.81);Muscle weakness (generalized) (M62.81);History of falling (Z91.81);Repeated falls (R29.6);Difficulty in  walking, not elsewhere classified (R26.2)    Time: 0354-6568 PT Time Calculation (min) (ACUTE ONLY): 20 min   Charges:   PT Evaluation $PT Eval Moderate Complexity: 1 Mod          Farley Ly, PT, DPT  Acute Rehabilitation Services  Pager: [phone] Office: [phone]   Lehman Prom 09/06/2020, 11:30 AM

## 2020-09-06 NOTE — Consult Note (Signed)
Reason for Consult: fecal incontinence  Referring Physician: Dr. Karma Lew is an 64 y.o. male.  HPI: Mr Widener is a 64 year old male with PMHx of hypertension, diabetes mellitus, hyperlipidemia, recent left upper thigh abscess s/p I&D, chronic pain syndrome presenting after a mechanical fall without injury secondary to a one month history of bilateral lower extremity weakness with fecal incontinence. Patient endorses roughly 2 months of fecal incontinence (twice daily). He states that he can feel "a little bit" when staff wiped him. He has no issue with urination or passing flatus. He endorses lower back pain, in the midline, worse with movements, improved with rest, with numbness on his buttocks. Pt denies numbness on his thighs. Endorses numbness on posterior part of his legs. Past Medical History:  Diagnosis Date  . Cellulitis 07/17/2020   LEFT HIP  . Diabetes mellitus   . Hyperlipidemia   . Hypertension     Past Surgical History:  Procedure Laterality Date  . SPINE SURGERY      No family history on file.  Social History:  reports that he has been smoking cigarettes. He has a 20.00 pack-year smoking history. He has never used smokeless tobacco. He reports that he does not drink alcohol and does not use drugs.  Allergies:  Allergies  Allergen Reactions  . Lipitor [Atorvastatin Calcium] Other (See Comments)    Muscle pain    Medications: I have reviewed the patient's current medications.  Results for orders placed or performed during the hospital encounter of 09/05/20 (from the past 48 hour(s))  Basic metabolic panel     Status: Abnormal   Collection Time: 09/05/20  2:14 PM  Result Value Ref Range   Sodium 138 135 - 145 mmol/L   Potassium 4.2 3.5 - 5.1 mmol/L   Chloride 109 98 - 111 mmol/L   CO2 20 (L) 22 - 32 mmol/L   Glucose, Bld 145 (H) 70 - 99 mg/dL    Comment: Glucose reference range applies only to samples taken after fasting for at least 8 hours.    BUN 27 (H) 8 - 23 mg/dL   Creatinine, Ser 7.82 0.61 - 1.24 mg/dL   Calcium 8.6 (L) 8.9 - 10.3 mg/dL   GFR calc non Af Amer >60 >60 mL/min   GFR calc Af Amer >60 >60 mL/min   Anion gap 9 5 - 15    Comment: Performed at [facility], [address] [place], [place] [address]  CBC     Status: Abnormal   Collection Time: 09/05/20  2:14 PM  Result Value Ref Range   WBC 8.0 4.0 - 10.5 K/uL   RBC 4.00 (L) 4.22 - 5.81 MIL/uL   Hemoglobin 12.3 (L) 13.0 - 17.0 g/dL   HCT 30.8 39 - 52 %   MCV 98.8 80.0 - 100.0 fL   MCH 30.8 26.0 - 34.0 pg   MCHC 31.1 30.0 - 36.0 g/dL   RDW 65.7 84.6 - 96.2 %   Platelets 300 150 - 400 K/uL   nRBC 0.0 0.0 - 0.2 %    Comment: Performed at [facility], [address] [place], [place] [address]  CBG monitoring, ED     Status: Abnormal   Collection Time: 09/05/20  7:03 PM  Result Value Ref Range   Glucose-Capillary 103 (H) 70 - 99 mg/dL    Comment: Glucose reference range applies only to samples taken after fasting for at least 8 hours.  Urinalysis,  Routine w reflex microscopic Urine, Clean Catch     Status: Abnormal   Collection Time: 09/06/20 12:36 AM  Result Value Ref Range   Color, Urine YELLOW YELLOW   APPearance HAZY (A) CLEAR   Specific Gravity, Urine 1.029 1.005 - 1.030   pH 5.0 5.0 - 8.0   Glucose, UA NEGATIVE NEGATIVE mg/dL   Hgb urine dipstick NEGATIVE NEGATIVE   Bilirubin Urine NEGATIVE NEGATIVE   Ketones, ur NEGATIVE NEGATIVE mg/dL   Protein, ur NEGATIVE NEGATIVE mg/dL   Nitrite NEGATIVE NEGATIVE   Leukocytes,Ua SMALL (A) NEGATIVE   RBC / HPF 0-5 0 - 5 RBC/hpf   WBC, UA 11-20 0 - 5 WBC/hpf   Bacteria, UA RARE (A) NONE SEEN   Squamous Epithelial / LPF 0-5 0 - 5   Mucus PRESENT     Comment: Performed at [facility], [address] [place], [place] [address]  CK     Status: None   Collection Time: 09/06/20 12:36 AM  Result Value Ref Range   Total CK 63 49.0 - 397.0 U/L    Comment: Performed at [facility],  [address] [place], [place] [address]  Magnesium     Status: None   Collection Time: 09/06/20 12:36 AM  Result Value Ref Range   Magnesium 2.0 1.7 - 2.4 mg/dL    Comment: Performed at [facility], [address] [place], [place] [address]  Comprehensive metabolic panel     Status: Abnormal   Collection Time: 09/06/20  6:35 AM  Result Value Ref Range   Sodium 138 135 - 145 mmol/L   Potassium 4.5 3.5 - 5.1 mmol/L   Chloride 106 98 - 111 mmol/L   CO2 22 22 - 32 mmol/L   Glucose, Bld 131 (H) 70 - 99 mg/dL    Comment: Glucose reference range applies only to samples taken after fasting for at least 8 hours.   BUN 25 (H) 8 - 23 mg/dL   Creatinine, Ser 1.30 0.61 - 1.24 mg/dL   Calcium 9.2 8.9 - 86.5 mg/dL   Total Protein 7.1 6.5 - 8.1 g/dL   Albumin 3.3 (L) 3.5 - 5.0 g/dL   AST 20 15 - 41 U/L   ALT 16 0 - 44 U/L   Alkaline Phosphatase 98 38 - 126 U/L   Total Bilirubin 0.9 0.3 - 1.2 mg/dL   GFR calc non Af Amer >60 >60 mL/min   GFR calc Af Amer >60 >60 mL/min   Anion gap 10 5 - 15    Comment: Performed at [facility], [address] [place], [place] [address]  CBC WITH DIFFERENTIAL     Status: Abnormal   Collection Time: 09/06/20  6:35 AM  Result Value Ref Range   WBC 9.9 4.0 - 10.5 K/uL   RBC 4.61 4.22 - 5.81 MIL/uL   Hemoglobin 14.6 13.0 - 17.0 g/dL   HCT 62.9 39 - 52 %   MCV 102.2 (H) 80.0 - 100.0 fL   MCH 31.7 26.0 - 34.0 pg   MCHC 31.0 30.0 - 36.0 g/dL   RDW 52.8 41.3 - 24.4 %   Platelets 355 150 - 400 K/uL   nRBC 0.0 0.0 - 0.2 %   Neutrophils Relative % 71 %   Neutro Abs 7.0 1.7 - 7.7 K/uL   Lymphocytes Relative 17 %   Lymphs Abs 1.7 0.7 - 4.0 K/uL   Monocytes Relative 7 %   Monocytes Absolute  0.7 0 - 1 K/uL   Eosinophils Relative 5 %   Eosinophils Absolute 0.5 0 - 0 K/uL   Basophils Relative 0 %   Basophils Absolute 0.0 0 - 0 K/uL   Immature Granulocytes 0 %   Abs Immature Granulocytes 0.03 0.00 - 0.07 K/uL    Comment: Performed at [facility], [address] [place], [place] [address]  CBG monitoring, ED     Status: Abnormal   Collection Time: 09/06/20  8:05 AM  Result Value Ref Range   Glucose-Capillary 110 (H) 70 - 99 mg/dL    Comment: Glucose reference range applies only to samples taken after fasting for at least 8 hours.  CBG monitoring, ED     Status: Abnormal   Collection Time: 09/06/20  1:05 PM  Result Value Ref Range   Glucose-Capillary 104 (H) 70 - 99 mg/dL    Comment: Glucose reference range applies only to samples taken after fasting for at least 8 hours.    DG Abd 1 View  Result Date: 09/06/2020 CLINICAL DATA:  Diarrhea for 2 months. EXAM: ABDOMEN - 1 VIEW COMPARISON:  None. FINDINGS: Moderate stool noted throughout the colon and down into the rectum suggesting constipation. Scattered air-filled loops of small bowel but no distension. The soft tissue shadows are grossly maintained. No worrisome calcifications. IVC filter is noted. Left acetabular fixation hardware noted. Severe degenerative changes involving the left hip. Advanced degenerative changes involving the lumbar spine. IMPRESSION: 1. Moderate stool throughout the colon suggesting constipation. 2. No findings for small bowel obstruction or free air. Electronically Signed   By: Rudie Meyer M.D.   On: 09/06/2020 10:59   MR LUMBAR SPINE WO CONTRAST  Result Date: 09/06/2020 CLINICAL DATA:  Low back pain with progressive neurologic deficit EXAM: MRI LUMBAR SPINE WITHOUT CONTRAST TECHNIQUE: Multiplanar, multisequence MR imaging of the lumbar spine was performed. No intravenous contrast was administered. COMPARISON:  07/16/2008 FINDINGS: Segmentation:  5 lumbar type vertebrae Alignment:  Straightening of the lumbar spine. Vertebrae: Benign heterogeneity of marrow that is generalized. There are levels of increased T2 signal within the disc space associated with bridging endplate osteophytes and attributed to ankylosis. Most notably this is seen at T11-12, L3-4,  and L4-5. No acute fracture. No focal bone lesion. Conus medullaris and cauda equina: Conus extends to the L1-2 level. Conus and cauda equina appear normal. Paraspinal and other soft tissues: Minimally covered right upper pole cystic intensity. Generalized body wall edema where covered. Disc levels: T12- L1: Spondylosis with possible bridging. L1-L2: Spondylosis. L2-L3: Spondylosis with bulky left left ventral and right far-lateral spurring, the latter encroaching on the exiting L2 nerve root to a prominent degree. The canal is patent L3-L4: Spondylosis with bridging osteophytes. The canal and foramina are patent L4-L5: Spondylosis with bulky bridging osteophytes. Mild thecal sac narrowing. Patent foramina L5-S1:Facet osteoarthritis with moderate hypertrophy. Spondylosis with disc bulging and a small protrusion and annular fissure that is left paracentral. Left subarticular recess narrowing that could affect the left descending S1 nerve root. IMPRESSION: 1. No acute finding. 2. 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 A bulky right far-lateral spur at L2-3 impinges on the right L2 nerve root. 3. L5-S1 left subarticular recess narrowing that could affect the left S1 nerve root. 4. Diffusely patent spinal canal. Electronically Signed   By: Marnee Spring M.D.   On: 09/06/2020 04:39   DG Knee Complete 4 Views Left  Result Date: 09/06/2020 CLINICAL DATA:  Generalized  weakness, bilateral lower extremity numbness after fall EXAM: LEFT KNEE - COMPLETE 4+ VIEW COMPARISON:  07/15/2020 FINDINGS: Frontal, bilateral oblique, and cross-table lateral views of the left knee are obtained. There is 3 compartmental osteoarthritis greatest in the medial compartment. No acute displaced fracture. Trace joint effusion. Soft tissues are otherwise unremarkable. IMPRESSION: 1. Three compartmental osteoarthritis with likely reactive trace joint effusion. 2. No acute bony abnormality.  Electronically Signed   By: Sharlet Salina M.D.   On: 09/06/2020 00:07   DG Knee Complete 4 Views Right  Result Date: 09/06/2020 CLINICAL DATA:  Lower extremity numbness, fell EXAM: RIGHT KNEE - COMPLETE 4+ VIEW COMPARISON:  None FINDINGS: Frontal, bilateral oblique, lateral views of the right knee are obtained. There is 3 compartmental osteoarthritis greatest in the medial compartment. No fracture, subluxation, or dislocation. Soft tissues are unremarkable. IMPRESSION: 1. Three compartmental osteoarthritis.  No acute bony abnormality. Electronically Signed   By: Sharlet Salina M.D.   On: 09/06/2020 00:10   DG Hip Unilat W or Wo Pelvis 2-3 Views Left  Result Date: 09/06/2020 CLINICAL DATA:  Larey Seat, left hip pain EXAM: DG HIP (WITH OR WITHOUT PELVIS) 2-3V LEFT COMPARISON:  07/31/2020 FINDINGS: Frontal view of the pelvis as well as frontal and frogleg lateral views of the left hip are obtained. Stable postsurgical changes from prior acetabular ORIF. There is severe left hip osteoarthritis with marked joint space narrowing and osteophyte formation. No acute displaced fracture, subluxation, or dislocation. Severe lower lumbar spondylosis. IMPRESSION: 1. Stable left hip osteoarthritis.  No acute fracture. Electronically Signed   By: Sharlet Salina M.D.   On: 09/06/2020 00:09   DG Hip Unilat W or Wo Pelvis 2-3 Views Right  Result Date: 09/06/2020 CLINICAL DATA:  Right hip pain after fall, numbness EXAM: DG HIP (WITH OR WITHOUT PELVIS) 2-3V RIGHT COMPARISON:  06/12/2008 FINDINGS: Frontal and frogleg lateral views of the right hip are obtained. There is mild osteoarthritis of the right hip with joint space narrowing and osteophyte formation. No acute displaced fracture. Visualized portions of the bony pelvis are unremarkable. IMPRESSION: 1. Right hip osteoarthritis.  No acute fracture. Electronically Signed   By: Sharlet Salina M.D.   On: 09/06/2020 00:08    Review of Systems  Respiratory: Negative.    Cardiovascular: Negative.   Genitourinary:       Fecal incontinence  Musculoskeletal: Positive for back pain.  Skin: Positive for rash and wound.       Healing wound noted on the left upper thigh  Neurological: Positive for weakness.       Bilateral legs weakness   Psychiatric/Behavioral: Negative.    Blood pressure 117/64, pulse 92, temperature 98.6 F (37 C), temperature source Rectal, resp. rate 20, height 5\' 10"  (1.778 m), weight 99.8 kg, SpO2 100 %. Physical Exam HENT:     Head: Normocephalic and atraumatic.  Cardiovascular:     Rate and Rhythm: Normal rate.  Pulmonary:     Effort: Pulmonary effort is normal.  Abdominal:     Comments: Rash under abdominal fold  Genitourinary:    Comments: Pt refused rectal exam Musculoskeletal:        General: No deformity.     Right lower leg: No edema.     Left lower leg: No edema.     Comments: Pt unable straight out his legs. RLE strength: 5/5; LLE:4/5 in Dorsiflexion and 4/5 plantar flexion, good strength in his quads muscles due to muscle spasms.   Skin:    General: Skin  is warm and dry.     Findings: Rash present.     Comments: Healing wound on left upper thigh  Neurological:     General: No focal deficit present.     Mental Status: He is alert and oriented to person, place, and time.     Sensory: No sensory deficit.     Assessment/Plan: Mr Thalia PartyDearman is a 64 year old male with PMHx of hypertension, diabetes mellitus, hyperlipidemia, recent left upper thigh abscess s/p I&D, chronic pain syndrome presenting after a mechanical fall without injury secondary to a one month history of bilateral lower extremity weakness with fecal incontinence. MRI lumbar with L5-S1 left subarticular recess narrowing, L2-3: right far lateral spurring. Conus and cauda equina appear normal.  Pt denies rectal exam.  Sensory intact to lower extremity, RLE strength: 5/5; LLE:4/5 in Dorsiflexion and 4/5 plantar flexion, good strength in his quads muscles.  Pt refused to straight out his legs. Unclear on what is causing the fecal incontinence. However, from radiographic standpoint and physical examination, patient doesn't need any emergent surgical intervention at this point. No evidence of significant motor weakness. No evidence of cauda equina syndrome.  I think it would be beneficial to consult the neurology team.    Thank you for allowing me to participate in this patient's care.  Please do not hesitate to call with questions.  Kennon PortelaKim Marlowe Cinquemani, NP 09/06/2020, 4:48 PM

## 2020-09-06 NOTE — Hospital Course (Addendum)
[name] is a 64 year old male with PMHx of hypertension, diabetes mellitus, hyperlipidemia, recent left upper thigh abscess s/p I&D, chronic pain syndrome presenting after a mechanical fall without injury secondary to a one month history of bilateral lower extremity weakness with fecal incontinence.   Fall due to weakness Patient presented after a witnessed fall from standing height and landed on knees. He denied any injury. Felt dizzy prior to falling but attributed this to tobacco use as he smoked a cigarette. Denied head trauma or LOC. Imaging negative for any acute fractures. Fall is likely mechanical given his lower extremity weakness. PT recommended SNF with 24-hour assistance given significant weakness.   Fecal incontinence Rectal exam reveals decreased sphincter tone and MRI findings are suggestive of progressive spondylosis and possible compression of S1 nerve root due to narrowing of L5-S1 subarticular recess. KUB ant CTA chest, abdomen, pelvis shows moderate stool burden suggesting constipation without signs of SBO or free air. We switched Miralax PRN to daily given constipation, which may have been contributing to fecal incontinence.  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 None of the imaging was significant to explain significant weight loss or significant weakness.     Bilateral plantar desquamation, erythematous papular eruption of trunk and arms Patient endorsed that this rash had been present over the past month but had not been evaluated by podiatry or dermatology. It appeared consistent with tinea pedis in addition to some other aggravating pathology. Itching truncal  rash noted on core at admission, concerning for eczema vs allergic dermatitis. He was given triamcinolone/Eucerin cream for itching, diffuse eczematous rash.    Infrapannicular intertrigo Impressively red rash consistent with intertrigo, with potential overlying cellulitis. This was treated with Nystatin powder TID, hygiene, and 7 day course of Fluconazole.     LLE thigh wound Well-healing left thigh wound and surgical scar. No active drainage, erythema or tenderness surrounding the area. No signs of cellulitis in the wound at admission. Wound care consulted, recommendations provided for dressings. He was monitored for any signs of infection throughout his stay. Area progressed to well healed by discharge.   Chronic pain syndrome Patient has history of chronic pain and spasms of the BLE for which he is on baclofen and oxycodone IR at home. Initially patient was continued on baclofen but was eventually transitioned to tizanidine. Patient was continued on home oxy but was placed PRN inpatient.   Diabetes mellitus Most recent HbA1c 6.9. Patient is not on any diabetic medication at home. Patient was placed on SSI and had CBG monitoring ac and qhs.  Depressed mood Spoke with patient about depression as he showed signs and family also noted patient had had significant change and mood after death of mother.Started patient on 5mg  Lexapro daily and implemented daily goals with patient. Patient saw some improvement, increased to 10mg .

## 2020-09-06 NOTE — Progress Notes (Addendum)
Subjective: [name] is feeling well this morning, although he has some pain in his tailbone and a little soreness in both knees.  Rest of history can be viewed in H&P.  Objective: Vital signs in last 24 hours: Vitals:   09/06/20 0200 09/06/20 0243 09/06/20 0330 09/06/20 0514  BP: 128/78  135/67 136/65  Pulse: 96   92  Resp: 18   18  Temp:  98.6 F (37 C)    TempSrc:  Rectal    SpO2: 99%   100%  Weight:      Height:       Weight change:  No intake or output data in the 24 hours ending 09/06/20 0648  Physical Exam Constitutional:      General: He is not in acute distress.    Appearance: Normal appearance. He is not ill-appearing, toxic-appearing or diaphoretic.  HENT:     Head: Normocephalic and atraumatic.  Cardiovascular:     Rate and Rhythm: Normal rate and regular rhythm.     Pulses: Normal pulses.  Pulmonary:     Effort: Pulmonary effort is normal.  Abdominal:     General: Abdomen is flat. Bowel sounds are normal.  Skin:    General: Skin is warm.     Coloration: Skin is not jaundiced.     Findings: Erythema, lesion and rash present.  He has an erythematous papular rash across his chest, upper arms, and back.  The skin of his back is draping and remarkably loose, consistent with massive weight loss. He has two lesions on his bilateral knees consistent with mild skin abrasions from a fall.  He has a well healing wound, 3-4 cm with well healing scar down lateral aspect of left thigh. He also has an impressively red plaque in the intertriginous area beneath his abdominal pannus that is coated in a dry white powder. Finally, he exhibits an impressive, dry, desquamating rash of the skin of both lower extremities from the calves to the toes. The floor beneath his feet is covered in flakes of dead skin, which readily fall from his feet with the slightest touch. His toenails are markedly thickened and misshapen with an underlying mycotic infiltrate. Neurological:      General: No focal deficit present.     Mental Status: He is alert and oriented to person, place, and time. Mental status is at baseline.  Comments: Weakness of left lower extremity, patient notes weakness since leg wound.  Right lower extremity 5/5 strength  Numbness of buttocks and posterior legs bilaterally. Anal: Decreased anal sphincter tone Psychiatric:        Mood and Affect: Mood normal.        Behavior: Behavior normal.        Thought Content: Thought content normal.        Judgment: Judgment normal.   Labs: UA showed only small leukocytes and rare bacteria, glc 103, Hgb 12.3, BUN 27 from 9 and Cr 1.18 from 1.08 1 month ago, CO2 20, Ca 8.6 at baseline, BMP otherwise normal.  Imaging: Walter lumbar spine showed no acute findings. Showed generalized spondylosis which has progressed from 2009 with multilevel ankylosis. A bulky right far-lateral spur at L2-3 impinges on the right L2 nerve root. L5-S1 narrowing that could affect the left S1 nerve root Hip XR showed bilateral hip osteoarthritis without fracture. Knee XR showed bilateral knee osteoarthritis, with likely reactive trace joint effusion on the left  Assessment/Plan:  Active Problems:   Accident due to mechanical fall  without injury  [name] is a 64 year old male with PMHx of hypertension, diabetes mellitus, hyperlipidemia, recent left upper thigh abscess s/p I&D, chronic pain syndrome presenting after a mechanical fall without injury secondary to a one month history of bilateral lower extremity weakness with fecal incontinence.  Fall due to weakness Patient presents after a witnessed fall from standing height and landed on knees. He denies any injury. Felt dizzy prior to falling but attributes this to tobacco use as he smoked a cigarette. Denies head trauma or LOC. Imaging negative for any acute fractures. Fall is likely mechanical given his lower extremity weakness.  - PT recommends SNF with 24-hour assistance given  significant weakness - Neurosurgery consult placed, appreciate recommendations  Fecal incontinence Unclear what is driving this incontinence. Possibilities include neural compression or neurological abnormalities, diabetic colonic dysmotility, or constipation leading to incontinence. He was constipated during his previous admission, so constipation is a reasonable explanation. Rectal exam reveals decreased sphincter tone and MRI findings are suggestive of progressive spondylosis and possible compression of S1 nerve root due to narrowing of L5-S1 subarticular recess.  - KUB shows moderate stool burden suggesting constipation without signs of SBO or free air - Neurosurgery consult pending - Switch Miralax PRN to daily given constipation, which may be contributing to fecal incontinence  Bilateral plantar desquamation, erythematous papular eruption of trunk and arms Patient endorses that this has been present over the past month but has not been evaluated by podiatry or dermatology. It appears consistent with tinea pedis in addition to some other aggravating pathology. Itching truncal rash noted on core at admission, concerning for eczema vs allergic dermatitis.  - Triamcinolone/Eucerin cream for itching, diffuse eczematous rash - Consider biopsy of skin rash if no significant improvement  Infrapannicular intertrigo Impressively red rash consistent with intertrigo, with potential overlying cellulitis. - Nystatin powder BID - Will monitor and treat if he shows signs of cellulitis  LLE thigh wound Well-healing left thigh wound and surgical scar. No active drainage, erythema or tenderness surrounding the area. No signs of cellulitis at this time.  - Wound care consulted, recommendations provided - Monitor for any signs of infection  Chronic pain syndrome Patient has history of chronic pain for which he is on baclofen and oxycodone IR.  - Continue home oxycodone PRN  Diabetes mellitus Most  recent HbA1c 6.9. Patient is not on any diabetic medication at home. CBG ~100 thus far. - CBG monitoring - SSI tid with meals  Diet: Heart healthy/carb modified, thin liquids IVF: None VTE prophylaxis: Lovenox 40 mg daily PT/OT: SNF Code: Full Dispo: Anticipated discharge TBD    LOS: 0 days   Pasty Arch, Medical Student 09/06/2020, 6:48 AM

## 2020-09-06 NOTE — ED Notes (Signed)
Patient transported to MRI 

## 2020-09-06 NOTE — Progress Notes (Signed)
CSW spoke with Tammy at [facility] who states this patient was discharged yesterday from the facility. Tammy states that at the time of discharge, the patient was able to stand unassisted and get himself into the truck. Tammy states the patient was discharged home with his brother [name].  PT evaluation states this patient needs additional SNF placement.   Edwin Dada, MSW, LCSW-A Transitions of Care  Clinical Social Worker  [facility]  Medical ICU [phone]

## 2020-09-07 ENCOUNTER — Inpatient Hospital Stay (HOSPITAL_COMMUNITY): Payer: Medicare Other

## 2020-09-07 DIAGNOSIS — R634 Abnormal weight loss: Secondary | ICD-10-CM | POA: Diagnosis present

## 2020-09-07 DIAGNOSIS — R234 Changes in skin texture: Secondary | ICD-10-CM | POA: Diagnosis present

## 2020-09-07 DIAGNOSIS — M62838 Other muscle spasm: Secondary | ICD-10-CM | POA: Diagnosis present

## 2020-09-07 DIAGNOSIS — B372 Candidiasis of skin and nail: Secondary | ICD-10-CM | POA: Diagnosis present

## 2020-09-07 DIAGNOSIS — R21 Rash and other nonspecific skin eruption: Secondary | ICD-10-CM | POA: Diagnosis present

## 2020-09-07 DIAGNOSIS — R61 Generalized hyperhidrosis: Secondary | ICD-10-CM | POA: Diagnosis present

## 2020-09-07 DIAGNOSIS — R202 Paresthesia of skin: Secondary | ICD-10-CM | POA: Diagnosis present

## 2020-09-07 DIAGNOSIS — R159 Full incontinence of feces: Secondary | ICD-10-CM | POA: Diagnosis present

## 2020-09-07 LAB — CBC
HCT: 33.7 % — ABNORMAL LOW (ref 39.0–52.0)
Hemoglobin: 11 g/dL — ABNORMAL LOW (ref 13.0–17.0)
MCH: 31.9 pg (ref 26.0–34.0)
MCHC: 32.6 g/dL (ref 30.0–36.0)
MCV: 97.7 fL (ref 80.0–100.0)
Platelets: 276 10*3/uL (ref 150–400)
RBC: 3.45 MIL/uL — ABNORMAL LOW (ref 4.22–5.81)
RDW: 14.4 % (ref 11.5–15.5)
WBC: 9 10*3/uL (ref 4.0–10.5)
nRBC: 0 % (ref 0.0–0.2)

## 2020-09-07 LAB — COMPREHENSIVE METABOLIC PANEL
ALT: 14 U/L (ref 0–44)
AST: 15 U/L (ref 15–41)
Albumin: 2.4 g/dL — ABNORMAL LOW (ref 3.5–5.0)
Alkaline Phosphatase: 75 U/L (ref 38–126)
Anion gap: 8 (ref 5–15)
BUN: 22 mg/dL (ref 8–23)
CO2: 19 mmol/L — ABNORMAL LOW (ref 22–32)
Calcium: 8.2 mg/dL — ABNORMAL LOW (ref 8.9–10.3)
Chloride: 110 mmol/L (ref 98–111)
Creatinine, Ser: 1.02 mg/dL (ref 0.61–1.24)
GFR calc Af Amer: >60 mL/min (ref >60)
GFR calc non Af Amer: >60 mL/min (ref >60)
Glucose, Bld: 116 mg/dL — ABNORMAL HIGH (ref 70–99)
Potassium: 4 mmol/L (ref 3.5–5.1)
Sodium: 137 mmol/L (ref 135–145)
Total Bilirubin: 1 mg/dL (ref 0.3–1.2)
Total Protein: 5.4 g/dL — ABNORMAL LOW (ref 6.5–8.1)

## 2020-09-07 LAB — GLUCOSE, CAPILLARY
Glucose-Capillary: 102 mg/dL — ABNORMAL HIGH (ref 70–99)
Glucose-Capillary: 159 mg/dL — ABNORMAL HIGH (ref 70–99)
Glucose-Capillary: 90 mg/dL (ref 70–99)
Glucose-Capillary: 148 mg/dL — ABNORMAL HIGH (ref 70–99)

## 2020-09-07 LAB — MRSA PCR SCREENING: MRSA by PCR: NEGATIVE

## 2020-09-07 MED ORDER — LORAZEPAM 0.5 MG PO TABS
0.5000 mg | ORAL_TABLET | Freq: Once | ORAL | Status: AC | PRN
  Administered 2020-09-08: 0.5 mg via ORAL
  Filled 2020-09-07: qty 1

## 2020-09-07 MED ORDER — LACTATED RINGERS IV BOLUS
500.0000 mL | Freq: Once | INTRAVENOUS | Status: AC
  Administered 2020-09-07: 500 mL via INTRAVENOUS

## 2020-09-07 MED ORDER — BACLOFEN 10 MG PO TABS
10.0000 mg | ORAL_TABLET | Freq: Two times a day (BID) | ORAL | Status: DC
  Administered 2020-09-07 – 2020-09-15 (×16): 10 mg via ORAL
  Filled 2020-09-07 (×16): qty 1

## 2020-09-07 MED ORDER — TRIAMCINOLONE 0.1 % CREAM:EUCERIN CREAM 1:1
TOPICAL_CREAM | Freq: Three times a day (TID) | CUTANEOUS | Status: DC
  Filled 2020-09-07 (×3): qty 1

## 2020-09-07 MED ORDER — LORAZEPAM 2 MG/ML IJ SOLN: 1.0000 mg | Freq: Once | INTRAMUSCULAR | Status: DC

## 2020-09-07 NOTE — Evaluation (Signed)
Occupational Therapy Evaluation Patient Details Name: [name] MRN: [id number] DOB: 04-01-1956 Today's Date: 09/07/2020    History of Present Illness Pt is a 64 y/o male admitted secondary to fall and increased weakness after being d/c'd from SNF. Pt with L hip wound as well. PMH includes DM and HTN.    Clinical Impression   Pt. Was ed on increasing I and safety with ADLs and mobilty. Pt. Was ed on use of AE to increase I. Pt. Does state he has them at home but had difficulty donning socks with AE. Pt. Was Mod A of 1 for sit to stand from elevated bed. Pt. Is an agreement with dc to SNF.    Follow Up Recommendations  SNF    Equipment Recommendations  None recommended by OT    Recommendations for Other Services       Precautions / Restrictions Precautions Precautions: Fall Restrictions Weight Bearing Restrictions: No      Mobility Bed Mobility Overal bed mobility: Needs Assistance Bed Mobility: Supine to Sit;Sit to Supine     Supine to sit: Min assist Sit to supine: Min assist      Transfers Overall transfer level: Needs assistance Equipment used: Rolling walker (2 wheeled) Transfers: Sit to/from Stand Sit to Stand: Mod assist         General transfer comment: cues for proper hand placement    Balance     Sitting balance-Leahy Scale: Fair       Standing balance-Leahy Scale: Poor                             ADL either performed or assessed with clinical judgement   ADL Overall ADL's : Needs assistance/impaired Eating/Feeding: Independent   Grooming: Wash/dry hands;Wash/dry face;Set up;Sitting   Upper Body Bathing: Sitting;Set up   Lower Body Bathing: Moderate assistance;Sit to/from stand   Upper Body Dressing : Set up;Sitting   Lower Body Dressing: Moderate assistance;With adaptive equipment;Sit to/from stand   Toilet Transfer: Moderate assistance;RW   Toileting- Clothing Manipulation and Hygiene: Total assistance;Sit  to/from stand       Functional mobility during ADLs: Moderate assistance;Rolling walker General ADL Comments: Pt. has used  AE previously and required Min A for sock donner     Vision Baseline Vision/History: Wears glasses Wears Glasses: At all times Patient Visual Report: Blurring of vision (told RN that pt. is complaining of blurry vision)       Perception     Praxis      Pertinent Vitals/Pain Pain Assessment: 0-10 Pain Score: 8  Pain Location: tailbone Pain Descriptors / Indicators: Aching Pain Intervention(s): Limited activity within patient's tolerance;Premedicated before session     Hand Dominance Right   Extremity/Trunk Assessment Upper Extremity Assessment Upper Extremity Assessment: Generalized weakness   Lower Extremity Assessment Lower Extremity Assessment: Defer to PT evaluation   Cervical / Trunk Assessment Cervical / Trunk Assessment: Kyphotic   Communication Communication Communication: No difficulties   Cognition Arousal/Alertness: Awake/alert Behavior During Therapy: WFL for tasks assessed/performed Overall Cognitive Status: No family/caregiver present to determine baseline cognitive functioning                                     General Comments       Exercises     Shoulder Instructions      Home Living Family/patient expects to be discharged  to:: Private residence Living Arrangements: Alone Available Help at Discharge: Family;Available PRN/intermittently Type of Home: Mobile home Home Access: Stairs to enter Entrance Stairs-Number of Steps: 3 Entrance Stairs-Rails: Right;Left Home Layout: One level     Bathroom Shower/Tub: Chief Strategy Officer: Standard     Home Equipment: Environmental consultant - 2 wheels;Shower seat;Wheelchair - manual;Bedside commode   Additional Comments: pt's brother has been bringing him food and he has been sitting in his w/c to prepare it. He reports that he has another brother that can  come live with him short term      Prior Functioning/Environment Level of Independence: Needs assistance  Gait / Transfers Assistance Needed: Was walking with RW when he left SNF.  ADL's / Homemaking Assistance Needed: Needed help with bathing and dressing at SNF.             OT Problem List: Decreased strength;Decreased activity tolerance;Impaired balance (sitting and/or standing);Decreased safety awareness;Decreased knowledge of use of DME or AE      OT Treatment/Interventions: Self-care/ADL training;DME and/or AE instruction;Therapeutic activities;Patient/family education    OT Goals(Current goals can be found in the care plan section) Acute Rehab OT Goals Patient Stated Goal: to go home OT Goal Formulation: With patient Time For Goal Achievement: 09/21/20 Potential to Achieve Goals: Good ADL Goals Pt Will Perform Grooming: with modified independence;standing Pt Will Perform Upper Body Bathing: with modified independence;sitting Pt Will Perform Lower Body Bathing: with modified independence;with adaptive equipment;sit to/from stand Pt Will Perform Upper Body Dressing: with modified independence;sitting Pt Will Perform Lower Body Dressing: with modified independence;with adaptive equipment;sit to/from stand Pt Will Transfer to Toilet: ambulating;with modified independence Pt Will Perform Toileting - Clothing Manipulation and hygiene: with modified independence;sit to/from stand  OT Frequency: Min 2X/week   Barriers to D/C: Decreased caregiver support          Co-evaluation              AM-PAC OT "6 Clicks" Daily Activity     Outcome Measure Help from another person eating meals?: None Help from another person taking care of personal grooming?: A Little Help from another person toileting, which includes using toliet, bedpan, or urinal?: A Lot Help from another person bathing (including washing, rinsing, drying)?: A Lot Help from another person to put on and taking  off regular upper body clothing?: A Little Help from another person to put on and taking off regular lower body clothing?: A Lot 6 Click Score: 16   End of Session Equipment Utilized During Treatment: Gait belt;Rolling walker Nurse Communication:  (discussed pt. mobility and blurry vision)  Activity Tolerance: Patient tolerated treatment well Patient left: in bed;with call bell/phone within reach;with bed alarm set  OT Visit Diagnosis: Unsteadiness on feet (R26.81);Repeated falls (R29.6)                Time: 1000-1033 OT Time Calculation (min): 33 min Charges:  OT General Charges $OT Visit: 1 Visit OT Evaluation $OT Eval Moderate Complexity: 1 Mod  Mattalynn Crandle OT/L  Clyde Zarrella 09/07/2020, 10:45 AM

## 2020-09-07 NOTE — Progress Notes (Signed)
Subjective: [name] is in pain this morning. He reports severe pain in his tailbone where he fell on it when getting out of his brother's truck. He is tired and did not get much rest last night because of the pain. He has also been experiencing muscle spasms in his legs. It is difficult and slightly painful for him to swallow pills, although food goes down easily. He has had swelling and pain of bilateral lower lateral eyelids associated with lacrimation of both eyes. He reports that at the end of his most recent SNF stay, he could walk with a walker.  Objective:  Vital signs in last 24 hours: Vitals:   09/06/20 2100 09/06/20 2234 09/07/20 0251 09/07/20 0603  BP: (!) 142/77 (!) 154/82 (!) 158/69 139/67  Pulse: (!) 105 (!) 102 98 99  Resp:  16 15 16   Temp:  98 F (36.7 C) 97.7 F (36.5 C) 97.9 F (36.6 C)  TempSrc:  Oral Oral Oral  SpO2: 100% 100% 98% 100%  Weight:      Height:       Weight change:   Intake/Output Summary (Last 24 hours) at 09/07/2020 0719 Last data filed at 09/07/2020 09/09/2020 Gross per 24 hour  Intake --  Output 125 ml  Net -125 ml   Physical Exam Constitutional:      General: He is not in acute distress.    Appearance: Normal appearance. He is not ill-appearing, toxic-appearing or diaphoretic.  HENT:     Head: Normocephalic and atraumatic.  Cardiovascular:     Rate and Rhythm: Normal rate and regular rhythm.     Pulses: Normal pulses.  Pulmonary:     Effort: Pulmonary effort is normal.  Abdominal:     General: Abdomen is flat. Bowel sounds are normal.  Skin:    General: Skin is warm.     Coloration: Skin is not jaundiced.     Findings: Erythema, lesion and rash present.  He has an erythematous papular rash across his chest, upper arms, and back, which is slightly less red today on his chest although still quite noticeable. There is a focally inflamed area of skin on his left chest which consists of confluent, blanching, erythematous papules  coalescing into plaques measuing approximately 5x10cm. The skin of his back is draping and remarkably loose, consistent with massive weight loss. He has two lesions on his bilateral knees consistent with mild skin abrasions from a fall.  He has a well healing wound, 3-4 cm with well healing scar down lateral aspect of left thigh. He also has an impressively red plaque in the intertriginous area beneath his abdominal pannus that is coated in a wet, white exudate that accumulates in the deeper recesses of the intertriginous zone. This is associated with a strong, distinctly candidal odor. Finally, he exhibits an impressive, dry, desquamating rash of the skin of both lower extremities from the calves to the toes. The floor beneath his feet is covered in flakes of dead skin, which readily fall from his feet with the slightest touch. His toenails are markedly thickened and misshapen with an underlying mycotic infiltrate. Neurological:     General: No focal deficit present.     Mental Status: He is alert and oriented to person, place, and time. Mental status is at baseline.  Comments: Weakness of left lower extremity, patient notes weakness since leg wound.  Right lower extremity 5/5 strength Numbness of buttocks and posterior legs bilaterally. Psychiatric:  Mood and Affect: Mood normal.        Behavior: Behavior normal.        Thought Content: Thought content normal.        Judgment: Judgment normal.    Assessment/Plan:  Active Problems:   Accident due to mechanical fall without injury  [name] is a 64 year old male with PMHx of hypertension, diabetes mellitus, hyperlipidemia, recent left upper thigh abscess s/p I&D, chronic pain syndrome presenting after a mechanical fall without injury secondary to a one month history of bilateral lower extremity weakness with fecal incontinence.  Fall due to weakness Patient presents after a witnessed fall from standing height and landed on  knees. He denies any injury. Felt dizzy prior to falling but attributes this to tobacco use as he smoked a cigarette. Denies head trauma or LOC. Imaging negative for any acute fractures. Fall is likely mechanical given his lower extremity weakness. Neurosurgery recommended neurology consult, who recommended MR of cervical and thoracic spine. Have attempted to contact [facility], the SNF where he spent a month, without success [phone]. Was trying to get an idea of his course and functional status there. - PT recommends SNF with 24-hour assistance given significant weakness - MR of C&T spine with contrast ordered 9/17  Weight loss, night sweats, c/f malignancy 50 kg weight loss in the span of months and 1 month of drenching night sweats concerning for malignancy with paraneoplastic syndrome, particularly given his fecal incontinence, rash, weakness, and numbness. - CT pelvis and abdomen with contrast - MR C&T spine as above  Fecal incontinence Unclear what is driving this incontinence. Possibilities include neural compression or neurological abnormalities, diabetic colonic dysmotility, malignancy, or constipation leading to incontinence. He was constipated during his previous admission and his KUB shows moderate stool burden suggesting constipation without signs of SBO or free air, so constipation is a reasonable explanation for at least part of this. However, rectal exam reveals decreased sphincter tone and MRI findings are suggestive of progressive spondylosis and possible compression of S1 nerve root due to narrowing of L5-S1 subarticular recess, raising the possibility that neurological pathology is contributing. - MR, CT as above - Switch Miralax PRN to daily given constipation, which may be contributing to fecal incontinence  Muscle spasms Previously well controlled on 5 mg baclofen BID but have continued here in spite of this regimen.  - Will increase baclofen to 10 mg BID and  monitor spasms  Bilateral plantar desquamation, erythematous papular eruption of trunk and arms It appears consistent with tinea pedis in addition to some other aggravating pathology. Itching truncal rash noted on core at admission, concerning for eczema vs allergic dermatitis.  - Triamcinolone/Eucerin cream for itching, diffuse, eczematous rash--switched from PRN to scheduled - Consider biopsy of skin rash if no significant improvement  Infrapannicular intertrigo Impressively red rash with exudate and odor consistent with intertriginous candidal infection with potential secondary cellulitis. - Nystatin powder BID - Fluconazole 200 mg oral for 7 days - Will monitor and treat if he shows signs of cellulitis  LLE thigh wound Well-healing left thigh wound and surgical scar. No active drainage, erythema or tenderness surrounding the area. No signs of cellulitis at this time.  - Wound care consulted, recommendations provided - Monitor for any signs of infection  Chronic pain syndrome Patient has history of chronic pain for which he is on baclofen and oxycodone IR.  - Continue home oxycodone 10 mg q6hrs PRN--encouraging him to request this when needed  Diabetes mellitus Most recent HbA1c 6.9. Patient is not on any diabetic medication at home. CBG ~100 thus far. - CBG monitoring - SSI tid with meals  Diet: Heart healthy/carb modified, thin liquids IVF: None VTE prophylaxis: Lovenox 40 mg daily PT/OT: SNF Code: Full Dispo: Anticipated discharge to SNF, date TBD   LOS: 1 day   Pasty Arch, Medical Student 09/07/2020, 7:19 AM

## 2020-09-07 NOTE — Progress Notes (Signed)
Attempted MRI. Pt stated immediately that he was unable to breath, went into room to talk to pt and he said, "I can't do this," multiple times. Spoke to nurse about meds but pt said he did not want to do MRI. RN aware.

## 2020-09-07 NOTE — Consult Note (Signed)
WOC Nurse Consult Note: Patient receiving care in [facility] 6N30.  Left hip wound previously addressed by my orders, please follow those. Reason for Consult: Per nightshift RN, Julieanne Cotton, MASD ITD to abdominal pannus Wound type: MASD-ITD Pressure Injury POA: Yes/No/NA Measurement: Wound bed: Drainage (amount, consistency, odor)  Periwound: Dressing procedure/placement/frequency: I have ordered InterDry to the abdominal pannus for MASD - ITD management. Monitor the wound area(s) for worsening of condition such as: Signs/symptoms of infection,  Increase in size,  Development of or worsening of odor, Development of pain, or increased pain at the affected locations.  Notify the medical team if any of these develop.  Thank you for the consult.  Discussed plan of care with the bedside nurse.  WOC nurse will not follow at this time.  Please re-consult the WOC team if needed.  Helmut Muster, RN, MSN, CWOCN, CNS-BC, pager [phone]

## 2020-09-08 ENCOUNTER — Encounter (HOSPITAL_COMMUNITY): Payer: Self-pay | Admitting: Internal Medicine

## 2020-09-08 DIAGNOSIS — Z9181 History of falling: Secondary | ICD-10-CM

## 2020-09-08 DIAGNOSIS — Z794 Long term (current) use of insulin: Secondary | ICD-10-CM

## 2020-09-08 DIAGNOSIS — R159 Full incontinence of feces: Secondary | ICD-10-CM

## 2020-09-08 DIAGNOSIS — R61 Generalized hyperhidrosis: Secondary | ICD-10-CM

## 2020-09-08 DIAGNOSIS — E119 Type 2 diabetes mellitus without complications: Secondary | ICD-10-CM

## 2020-09-08 DIAGNOSIS — R234 Changes in skin texture: Secondary | ICD-10-CM

## 2020-09-08 DIAGNOSIS — L304 Erythema intertrigo: Secondary | ICD-10-CM

## 2020-09-08 DIAGNOSIS — M62838 Other muscle spasm: Secondary | ICD-10-CM

## 2020-09-08 DIAGNOSIS — R634 Abnormal weight loss: Secondary | ICD-10-CM

## 2020-09-08 DIAGNOSIS — L02416 Cutaneous abscess of left lower limb: Secondary | ICD-10-CM

## 2020-09-08 DIAGNOSIS — I10 Essential (primary) hypertension: Secondary | ICD-10-CM

## 2020-09-08 DIAGNOSIS — R202 Paresthesia of skin: Secondary | ICD-10-CM

## 2020-09-08 DIAGNOSIS — G8929 Other chronic pain: Secondary | ICD-10-CM

## 2020-09-08 DIAGNOSIS — E785 Hyperlipidemia, unspecified: Secondary | ICD-10-CM

## 2020-09-08 LAB — COMPREHENSIVE METABOLIC PANEL
ALT: 15 U/L (ref 0–44)
AST: 14 U/L — ABNORMAL LOW (ref 15–41)
Albumin: 2.4 g/dL — ABNORMAL LOW (ref 3.5–5.0)
Alkaline Phosphatase: 78 U/L (ref 38–126)
Anion gap: 8 (ref 5–15)
BUN: 19 mg/dL (ref 8–23)
CO2: 20 mmol/L — ABNORMAL LOW (ref 22–32)
Calcium: 8.3 mg/dL — ABNORMAL LOW (ref 8.9–10.3)
Chloride: 112 mmol/L — ABNORMAL HIGH (ref 98–111)
Creatinine, Ser: 0.94 mg/dL (ref 0.61–1.24)
GFR calc Af Amer: >60 mL/min (ref >60)
GFR calc non Af Amer: >60 mL/min (ref >60)
Glucose, Bld: 117 mg/dL — ABNORMAL HIGH (ref 70–99)
Potassium: 4.2 mmol/L (ref 3.5–5.1)
Sodium: 140 mmol/L (ref 135–145)
Total Bilirubin: 0.6 mg/dL (ref 0.3–1.2)
Total Protein: 5.6 g/dL — ABNORMAL LOW (ref 6.5–8.1)

## 2020-09-08 LAB — CBC
HCT: 33.6 % — ABNORMAL LOW (ref 39.0–52.0)
Hemoglobin: 10.7 g/dL — ABNORMAL LOW (ref 13.0–17.0)
MCH: 31 pg (ref 26.0–34.0)
MCHC: 31.8 g/dL (ref 30.0–36.0)
MCV: 97.4 fL (ref 80.0–100.0)
Platelets: 257 10*3/uL (ref 150–400)
RBC: 3.45 MIL/uL — ABNORMAL LOW (ref 4.22–5.81)
RDW: 14.2 % (ref 11.5–15.5)
WBC: 8.3 10*3/uL (ref 4.0–10.5)
nRBC: 0 % (ref 0.0–0.2)

## 2020-09-08 LAB — GLUCOSE, CAPILLARY
Glucose-Capillary: 108 mg/dL — ABNORMAL HIGH (ref 70–99)
Glucose-Capillary: 119 mg/dL — ABNORMAL HIGH (ref 70–99)
Glucose-Capillary: 145 mg/dL — ABNORMAL HIGH (ref 70–99)
Glucose-Capillary: 106 mg/dL — ABNORMAL HIGH (ref 70–99)

## 2020-09-08 MED ORDER — IOHEXOL 9 MG/ML PO SOLN
500.0000 mL | ORAL | Status: AC
  Administered 2020-09-08: 500 mL via ORAL

## 2020-09-08 MED ORDER — ONDANSETRON HCL 4 MG/2ML IJ SOLN
4.0000 mg | Freq: Four times a day (QID) | INTRAMUSCULAR | Status: DC | PRN
  Filled 2020-09-08: qty 2

## 2020-09-08 MED ORDER — ENSURE ENLIVE PO LIQD
237.0000 mL | Freq: Two times a day (BID) | ORAL | Status: DC
  Administered 2020-09-08 – 2020-09-09 (×3): 237 mL via ORAL

## 2020-09-08 MED ORDER — ONDANSETRON HCL 4 MG/2ML IJ SOLN
INTRAMUSCULAR | Status: AC
  Filled 2020-09-08: qty 2

## 2020-09-08 NOTE — Progress Notes (Signed)
HD#2 Subjective:  Overnight Events: Patient did not want to have the MIR yesterday due to exhaustion.   On rounds this morning patient stated that he is exhausted and has not been able to sleep. He states that he not able to tolerate the MRI saying that it "drove him crazy sitting in the tube for an hour". I informed him that we would be able to give him medication sot help with his anxiety during the MRI, but he states that he still would not be able to tolerate it. I counseled him on the importance of performing MRI and CT scans to further work up the etiology of his LE weakness. He states that he would think about it today and make a decision tomorrow. I informed him that we could finish this work up in the OP setting if we don't pursue the scans at this time. We talked about HH vs SNF for further PT. I am concerned that he would still need SNF placement for subacute PT, but he stated that his brother may move in with him to proved him more support.     Objective:  Vital signs in last 24 hours: Vitals:   09/07/20 1400 09/07/20 1508 09/07/20 2015 09/07/20 2022  BP: (!) 92/43 134/67 115/61 (!) 167/56  Pulse: 89 81 98 76  Resp:    19  Temp:   98 F (36.7 C) 99.3 F (37.4 C)  TempSrc:   Oral Oral  SpO2:   99% 96%  Weight:      Height:       Supplemental O2: Room Air SpO2: 96 %   Physical Exam:  Physical Exam Constitutional:      Appearance: He is ill-appearing.  HENT:     Head: Normocephalic and atraumatic.  Eyes:     Extraocular Movements: Extraocular movements intact.  Cardiovascular:     Rate and Rhythm: Normal rate and regular rhythm.  Pulmonary:     Effort: Pulmonary effort is normal.     Breath sounds: Normal breath sounds.  Abdominal:     General: Bowel sounds are normal. There is no distension.     Palpations: Abdomen is soft.  Musculoskeletal:        General: Normal range of motion.     Cervical back: Normal range of motion.     Right lower leg: No edema.      Left lower leg: No edema.  Skin:    General: Skin is warm and dry.     Findings: Rash (abdomen and upper extremities (see media)) present.  Neurological:     General: No focal deficit present.     Mental Status: He is alert.     Motor: Weakness (LE, L>R) present.     Comments: Paresthesias in his lower extremities.   Psychiatric:     Comments: appears exhausted     [employer]   09/05/20 2132  Weight: 99.8 kg     Intake/Output Summary (Last 24 hours) at 09/08/2020 0623 Last data filed at 09/07/2020 2011 Gross per 24 hour  Intake 360 ml  Output 800 ml  Net -440 ml   Net IO Since Admission: -565 mL [09/08/20 0623]  Pertinent Labs: CBC Latest Ref Rng & Units 09/07/2020 09/06/2020 09/05/2020  WBC 4.0 - 10.5 K/uL 9.0 9.9 8.0  Hemoglobin 13.0 - 17.0 g/dL 11.0(L) 14.6 12.3(L)  Hematocrit 39 - 52 % 33.7(L) 47.1 39.5  Platelets 150 - 400 K/uL 276 355 300    CMP  Latest Ref Rng & Units 09/07/2020 09/06/2020 09/05/2020  Glucose 70 - 99 mg/dL 828(M) 034(J) 179(X)  BUN 8 - 23 mg/dL 22 50(V) 69(V)  Creatinine 0.61 - 1.24 mg/dL 9.48 0.16 5.53  Sodium 135 - 145 mmol/L 137 138 138  Potassium 3.5 - 5.1 mmol/L 4.0 4.5 4.2  Chloride 98 - 111 mmol/L 110 106 109  CO2 22 - 32 mmol/L 19(L) 22 20(L)  Calcium 8.9 - 10.3 mg/dL 8.2(L) 9.2 8.6(L)  Total Protein 6.5 - 8.1 g/dL 7.4(M) 7.1 -  Total Bilirubin 0.3 - 1.2 mg/dL 1.0 0.9 -  Alkaline Phos 38 - 126 U/L 75 98 -  AST 15 - 41 U/L 15 20 -  ALT 0 - 44 U/L 14 16 -    Imaging: No results found.  Assessment/Plan:   Active Problems:   Diabetes mellitus (HCC)   Chronic pain   Frailty syndrome in geriatric patient   Accident due to mechanical fall without injury   Loss of weight   Unexplained night sweats   Fecal incontinence   Leg muscle spasm   Candidal intertrigo   Skin desquamation   Papular eruption   Paresthesia of both legs   Patient Summary: [name] is a 64 year old male with PMHx of hypertension, diabetes mellitus,  hyperlipidemia, recent left upper thigh abscess s/p I&D, chronic pain syndrome presenting after a mechanical fall without injurysecondary to aone month history of bilateral lower extremity weakness with fecal incontinence.  Fall Lower Extremity Weakness  Patient continues to have lower extremity weakness. MRI of lumbar spine did not show any pathologic reason for his weakness. The weakness is worse on the left than the right. We will continue his work up with MRI of thoracic and lumbar spine. He will likely need SNF at discharge.  - MRIs of thoracic and cervical spine pending - Neurology consult pending MRI results - CT chest/abdomen/pelvis pending   Weight loss (50 lb) Night sweats  We are concerned that the patient has an underlying malignancy or inflammatory process that could cause his symptoms. Further work up is pending CT scans of chest and abdomen and MRI of spine.   - CT chest/abdomen/pelvis pending - MR C&T spine as above  Fecal incontinence Unclear of the etiology of his fecal incontinence. No sacral or lumbar lesion was found on MRI. KUB concerning for constipation. Will increase his bowel regimen today.  - Increase Miralax to TID dosing  Muscle spasms - Continue Baclofen 10 mg BID  Bilateral plantardesquamation, erythematous papular eruption of trunk and arms -Triamcinolone/Eucerin cream prn - Consider biopsy of skin rash if no significant improvement  Infrapannicularintertrigo - Continue Nystatin powder TID and Fluconazole 200 mg for 7 days  LLE thigh wound - Continue wound care  Chronic pain syndrome - Continue homeoxycodone 10 mg q6hrs PRN--encouraging him to request this when needed  Diabetes mellitus - CBG monitoring - SSI tid with meals  Diet: Heart Healthy IVF: None,None VTE: Enoxaparin Code: Full PT/OT recs: SNF for Subacute PT, none. TOC recs: none   Dispo: Anticipated discharge to Skilled nursing facility pending further evaluation  and management.   Please contact the on call pager after 5 pm and on weekends at [phone].

## 2020-09-08 NOTE — TOC Initial Note (Signed)
Transition of Care [facility]) - Initial/Assessment Note    Patient Details  Name: [name] MRN: [id number] Date of Birth: [birth year]  Transition of Care [facility]) CM/SW Contact:    Bary Castilla, LCSW Phone Number: [phone] 09/08/2020, 11:10 AM  Clinical Narrative:                  CSW met with patient to discuss his discharge planning needs. CSW informed patient that the recommendation is that for him to be discharged to a SNF. Patient explained that his experiences were not good at SNFs therefore he wanted to go home with home health. Patient informed CSW that his brother Fritz Pickerel is his HCPOA. CSW informed patient that she would follow up with Fritz Pickerel.  CSW spoke with Fritz Pickerel via phone and he confirmed that he was patient HCPOA and that he wanted patient to return home with [facility] Fritz Pickerel also explained that patient's experiences at SNFs were not good and thought he would do better at home. CSW inquired about care support when he returns home. Fritz Pickerel stated that he comes and checks on patient about a couple times a week. CSW stressed the importance for patient to have support with making sure that his daily living skills are met.   Fritz Pickerel inquired about 24 hour care and CSW explained that would be out of pocket expense however may be able to receive a PCA through his Medicaid. CSW informed Fritz Pickerel to reach out to patient's medicaid social worker to begin that process.  TOC team will continue to assist with discharge planning needs.     Expected Discharge Plan: [place] Barriers to Discharge: Continued Medical Work up   Patient Goals and CMS Choice        Expected Discharge Plan and Services Expected Discharge Plan: [place]       Living arrangements for the past 2 months: Mobile Home                                      Prior Living Arrangements/Services Living arrangements for the past 2 months: Mobile Home Lives with:: Self Patient  language and need for interpreter reviewed:: Yes Do you feel safe going back to the place where you live?: Yes               Activities of Daily Living      Permission Sought/Granted Permission sought to share information with : Family Supports Permission granted to share information with : Yes, Verbal Permission Granted  Share Information with NAME: Fritz Pickerel  Permission granted to share info w AGENCY: [place] granted to share info w Relationship: Brother  Permission granted to share info w Contact Information: [phone]  Emotional Assessment Appearance:: Appears stated age Attitude/Demeanor/Rapport: Engaged Affect (typically observed): Accepting Orientation: : Oriented to Self, Oriented to Place, Oriented to  Time, Oriented to Situation      Admission diagnosis:  Incontinence of bowel [R15.9] 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 Loss of weight 09/07/2020  . Unexplained night sweats 09/07/2020  . Fecal incontinence 09/07/2020  . Leg muscle spasm 09/07/2020  . Candidal intertrigo 09/07/2020  . Skin desquamation 09/07/2020  . Papular eruption 09/07/2020  . Paresthesia of both legs  09/07/2020  . Accident due to mechanical fall without injury 09/06/2020  . Hypoglycemia 07/31/2020  . Purulent Cellulitis of left hip 07/15/2020  . Recurrent falls 07/15/2020  . Chronic pain 07/15/2020  . 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 Physical deconditioning 07/15/2020  . Asymptomatic bacteriuria 07/15/2020  . Diabetes mellitus ([place]) 01/28/2012   PCP:  Patient, No Pcp Per Pharmacy:   [id number]: [phone]: [phone]    Social Determinants of Health (SDOH) Interventions    Readmission Risk  Interventions Readmission Risk Prevention Plan 08/01/2020  Post Dischage Appt Complete  Medication Screening Complete  Transportation Screening Complete  Some recent data might be hidden

## 2020-09-08 NOTE — Plan of Care (Signed)

## 2020-09-09 LAB — CBC
HCT: 33.7 % — ABNORMAL LOW (ref 39.0–52.0)
Hemoglobin: 10.9 g/dL — ABNORMAL LOW (ref 13.0–17.0)
MCH: 31.4 pg (ref 26.0–34.0)
MCHC: 32.3 g/dL (ref 30.0–36.0)
MCV: 97.1 fL (ref 80.0–100.0)
Platelets: 269 10*3/uL (ref 150–400)
RBC: 3.47 MIL/uL — ABNORMAL LOW (ref 4.22–5.81)
RDW: 14.3 % (ref 11.5–15.5)
WBC: 7.9 10*3/uL (ref 4.0–10.5)
nRBC: 0 % (ref 0.0–0.2)

## 2020-09-09 LAB — GLUCOSE, CAPILLARY
Glucose-Capillary: 98 mg/dL (ref 70–99)
Glucose-Capillary: 121 mg/dL — ABNORMAL HIGH (ref 70–99)
Glucose-Capillary: 111 mg/dL — ABNORMAL HIGH (ref 70–99)

## 2020-09-09 LAB — COMPREHENSIVE METABOLIC PANEL
ALT: 14 U/L (ref 0–44)
AST: 14 U/L — ABNORMAL LOW (ref 15–41)
Albumin: 2.3 g/dL — ABNORMAL LOW (ref 3.5–5.0)
Alkaline Phosphatase: 75 U/L (ref 38–126)
Anion gap: 5 (ref 5–15)
BUN: 18 mg/dL (ref 8–23)
CO2: 22 mmol/L (ref 22–32)
Calcium: 8.2 mg/dL — ABNORMAL LOW (ref 8.9–10.3)
Chloride: 112 mmol/L — ABNORMAL HIGH (ref 98–111)
Creatinine, Ser: 0.96 mg/dL (ref 0.61–1.24)
GFR calc Af Amer: >60 mL/min (ref >60)
GFR calc non Af Amer: >60 mL/min (ref >60)
Glucose, Bld: 98 mg/dL (ref 70–99)
Potassium: 3.9 mmol/L (ref 3.5–5.1)
Sodium: 139 mmol/L (ref 135–145)
Total Bilirubin: 0.4 mg/dL (ref 0.3–1.2)
Total Protein: 5.4 g/dL — ABNORMAL LOW (ref 6.5–8.1)

## 2020-09-09 NOTE — Progress Notes (Signed)
Subjective: Patient had no overnight events. He reports that he did not sleep well because staff came in and out of his room for treatment purposes. Patient also notes that he is "breaking out everywhere." He reported that he does not feel the new rash on his arms but can see it. He only reports lower back pain and continued bilateral leg spasms in terms of pain today.  Objective:  Vital signs in last 24 hours: Vitals:   09/07/20 2022 09/08/20 1523 09/08/20 2058 09/09/20 0507  BP: (!) 167/56 122/65 123/67 122/66  Pulse: 76 93 95 96  Resp: 19 17 17 17   Temp: 99.3 F (37.4 C) 98 F (36.7 C) 98.1 F (36.7 C) 98 F (36.7 C)  TempSrc: Oral Oral Oral Oral  SpO2: 96% 100% 100% 100%  Weight:      Height:       Physical Exam Constitutional:      General: He is not in acute distress. HENT:     Head: Normocephalic and atraumatic.  Cardiovascular:     Rate and Rhythm: Normal rate and regular rhythm.  Pulmonary:     Effort: Pulmonary effort is normal.     Breath sounds: Normal breath sounds.  Abdominal:     General: Bowel sounds are normal.     Palpations: Abdomen is soft.     Tenderness: There is no abdominal tenderness.  Musculoskeletal:     Comments: Pain BLE  Skin:    General: Skin is warm.     Findings: Rash present.     Comments: Maculopapular erythematous non pruritic rash on bilateral arms  Desquamation of bilateral feet  Strong scent of yeast  Neurological:     Mental Status: He is alert.     Motor: Weakness present.     Comments: L sided weakness  Psychiatric:        Behavior: Behavior normal.     Comments: A little irritated this morning as staff have been coming in and out of his room all night due to care. He feels that he was unable to get adequate rest.      Assessment/Plan:  Active Problems:   Diabetes mellitus (HCC)   Chronic pain   Frailty syndrome in geriatric patient   Accident due to mechanical fall without injury   Loss of weight   Unexplained  night sweats   Fecal incontinence   Leg muscle spasm   Candidal intertrigo   Skin desquamation   Papular eruption   Paresthesia of both legs  [name] is a 64 year old male with PMHx of hypertension, diabetes mellitus, hyperlipidemia, recent left upper thigh abscess s/p I&D, chronic pain syndrome presenting after a mechanical fall without injurysecondary to aone month history of bilateral lower extremity weakness with fecal incontinence.  Fall Lower Extremity Weakness  Patient continues to have lower back pain and left sided weakness. - MRIs of thoracic and cervical spine pending - Neurology consult pending MRI results - CT chest/abdomen/pelvis pending   Weight loss (50 lb) Night sweats  We are concerned that the patient has an underlying malignancy or inflammatory process that could cause his symptoms. Further work up is pending CT scans of chest and abdomen and MRI of spine.  Patient reported to have significant anxiety assoicated with imaging process. Will give Ativan and Haldol today prior to imaging per neurology.WBC WNL this AM. Hgb is stable at 10.9. Stable electrolytes. - CT chest/abdomen/pelvis pending - MR C&T spine as above - Haldol 0.5  immediatly prior to imaging - Ativan 2mg  prior to imaging  Fecal incontinence Unclear of the etiology of his fecal incontinence. No sacral or lumbar lesion was found on MRI. KUB concerning for constipation.  -  Miralax to TID dosing  Muscle spasms Patient continues to have muscles spasms in LE. - Continue Baclofen 10 mg BID  Bilateral plantardesquamation, erythematous papular eruption of trunk and arms -Triamcinolone/Eucerin cream prn - Consider biopsy of skin rash if no significant improvement  Infrapannicularintertrigo - Continue Nystatin powder TID and Fluconazole 200 mg for 7 days  Bilateral Arm Rash Patient has new non-purirtic erythematous macularpapular in no pattern across both arms.  the patient does not  endorse pain.  - Triamcinolone/ Eucerin   LLE thigh wound - Continue wound care  Chronic pain syndrome - Continue homeoxycodone10 mg q6hrsPRN--encouraging him to request this when needed  Diabetes mellitus - CBG monitoring - SSI tid with meals Patient required on 2U SAI yesterday.  Prior to Admission Living Arrangement: Anticipated Discharge Location: Barriers to Discharge: Dispo: Anticipated discharge in approximately 1-2 day(s).   , MD 09/09/2020, 8:51 AM Pager: [phone] After 5pm on weekdays and 1pm on weekends: On Call pager [phone]

## 2020-09-09 NOTE — Progress Notes (Signed)
Attempted to get up with walker/gait belt.  Patient unable to bear weight BLE.  Attempted to get patient up and into shower with stedy and x2 assist, patient has no trunk control and cannot safely remain upright.  Patient returned to bed and bed bath completed.

## 2020-09-09 NOTE — Progress Notes (Signed)
Attempted to bring pt down to MRI - pt refused. RN aware.

## 2020-09-10 ENCOUNTER — Inpatient Hospital Stay (HOSPITAL_COMMUNITY): Payer: Medicare Other

## 2020-09-10 DIAGNOSIS — M545 Low back pain: Secondary | ICD-10-CM

## 2020-09-10 DIAGNOSIS — R21 Rash and other nonspecific skin eruption: Secondary | ICD-10-CM

## 2020-09-10 LAB — CBC WITH DIFFERENTIAL/PLATELET
Abs Immature Granulocytes: 0.02 10*3/uL (ref 0.00–0.07)
Basophils Absolute: 0.1 10*3/uL (ref 0.0–0.1)
Basophils Relative: 1 %
Eosinophils Absolute: 0.7 10*3/uL — ABNORMAL HIGH (ref 0.0–0.5)
Eosinophils Relative: 8 %
HCT: 33.5 % — ABNORMAL LOW (ref 39.0–52.0)
Hemoglobin: 10.8 g/dL — ABNORMAL LOW (ref 13.0–17.0)
Immature Granulocytes: 0 %
Lymphocytes Relative: 23 %
Lymphs Abs: 2 10*3/uL (ref 0.7–4.0)
MCH: 31.3 pg (ref 26.0–34.0)
MCHC: 32.2 g/dL (ref 30.0–36.0)
MCV: 97.1 fL (ref 80.0–100.0)
Monocytes Absolute: 0.7 10*3/uL (ref 0.1–1.0)
Monocytes Relative: 8 %
Neutro Abs: 5.3 10*3/uL (ref 1.7–7.7)
Neutrophils Relative %: 60 %
Platelets: 295 10*3/uL (ref 150–400)
RBC: 3.45 MIL/uL — ABNORMAL LOW (ref 4.22–5.81)
RDW: 14.2 % (ref 11.5–15.5)
WBC: 8.9 10*3/uL (ref 4.0–10.5)
nRBC: 0 % (ref 0.0–0.2)

## 2020-09-10 LAB — COMPREHENSIVE METABOLIC PANEL
ALT: 14 U/L (ref 0–44)
AST: 13 U/L — ABNORMAL LOW (ref 15–41)
Albumin: 2.5 g/dL — ABNORMAL LOW (ref 3.5–5.0)
Alkaline Phosphatase: 76 U/L (ref 38–126)
Anion gap: 9 (ref 5–15)
BUN: 18 mg/dL (ref 8–23)
CO2: 21 mmol/L — ABNORMAL LOW (ref 22–32)
Calcium: 8.5 mg/dL — ABNORMAL LOW (ref 8.9–10.3)
Chloride: 111 mmol/L (ref 98–111)
Creatinine, Ser: 0.97 mg/dL (ref 0.61–1.24)
GFR calc Af Amer: >60 mL/min (ref >60)
GFR calc non Af Amer: >60 mL/min (ref >60)
Glucose, Bld: 104 mg/dL — ABNORMAL HIGH (ref 70–99)
Potassium: 3.9 mmol/L (ref 3.5–5.1)
Sodium: 141 mmol/L (ref 135–145)
Total Bilirubin: 0.6 mg/dL (ref 0.3–1.2)
Total Protein: 5.6 g/dL — ABNORMAL LOW (ref 6.5–8.1)

## 2020-09-10 LAB — GLUCOSE, CAPILLARY
Glucose-Capillary: 90 mg/dL (ref 70–99)
Glucose-Capillary: 89 mg/dL (ref 70–99)
Glucose-Capillary: 149 mg/dL — ABNORMAL HIGH (ref 70–99)
Glucose-Capillary: 126 mg/dL — ABNORMAL HIGH (ref 70–99)

## 2020-09-10 MED ORDER — ADULT MULTIVITAMIN W/MINERALS CH
1.0000 | ORAL_TABLET | Freq: Every day | ORAL | Status: DC
  Administered 2020-09-10 – 2020-09-17 (×8): 1 via ORAL
  Filled 2020-09-10 (×8): qty 1

## 2020-09-10 MED ORDER — LORAZEPAM 1 MG PO TABS
1.0000 mg | ORAL_TABLET | Freq: Once | ORAL | Status: AC
  Administered 2020-09-10: 1 mg via ORAL
  Filled 2020-09-10: qty 1

## 2020-09-10 MED ORDER — LIDOCAINE 5 % EX PTCH
1.0000 | MEDICATED_PATCH | CUTANEOUS | Status: DC
  Administered 2020-09-10 – 2020-09-17 (×8): 1 via TRANSDERMAL
  Filled 2020-09-10 (×8): qty 1

## 2020-09-10 MED ORDER — HYDROMORPHONE HCL 1 MG/ML IJ SOLN
0.5000 mg | Freq: Three times a day (TID) | INTRAMUSCULAR | Status: DC | PRN
  Filled 2020-09-10: qty 1

## 2020-09-10 NOTE — Progress Notes (Signed)
Physical Therapy Treatment Patient Details Name: [name] MRN: [id number] DOB: 1956-07-03 Today's Date: 09/10/2020    History of Present Illness Pt is a 64 y/o male admitted secondary to fall and increased weakness after being d/c'd from SNF. Pt with L hip wound as well. PMH includes DM and HTN.     PT Comments    Patient received in bed, agreeable to PT session. Asking how much I get pain, wants me to do more for him. Performed LE strengthening exercises in supine, some difficulty on left LE due to wound on that side requiring min assist. Asking for weights, a bike etc to help his strength. Required min assist to go from supine>< sit. Patient not willing to attempt to stand this date. He will continue to benefit from skilled PT while here to improve functional independence, strength and safety.       Follow Up Recommendations  SNF;Supervision/Assistance - 24 hour     Equipment Recommendations  None recommended by PT    Recommendations for Other Services       Precautions / Restrictions Precautions Precautions: Fall Restrictions Weight Bearing Restrictions: No    Mobility  Bed Mobility Overal bed mobility: Needs Assistance Bed Mobility: Supine to Sit;Sit to Supine     Supine to sit: Min assist Sit to supine: Min guard   General bed mobility comments: Mod assist to raise trunk to seated position. Patient reaches out for assist, but can do more himself.  Transfers                 General transfer comment: Patient declines attempting to stand  Ambulation/Gait             General Gait Details: not tested   Stairs             Wheelchair Mobility    Modified Rankin (Stroke Patients Only)       Balance Overall balance assessment: Needs assistance;Modified Independent     Sitting balance - Comments: able to sit without assistance. Single hand support on bed rail                                    Cognition  Arousal/Alertness: Awake/alert Behavior During Therapy: WFL for tasks assessed/performed Overall Cognitive Status: No family/caregiver present to determine baseline cognitive functioning                                        Exercises Other Exercises Other Exercises: B LE exercises: AP, heel slides, hip abd, SLR, LAQ x 10 reps some with min assist on left    General Comments        Pertinent Vitals/Pain Pain Assessment: Faces Faces Pain Scale: Hurts a little bit Pain Location: L LE Pain Descriptors / Indicators: Sore Pain Intervention(s): Monitored during session;Limited activity within patient's tolerance;Repositioned    Home Living                      Prior Function            PT Goals (current goals can now be found in the care plan section) Acute Rehab PT Goals Patient Stated Goal: to go home PT Goal Formulation: With patient Time For Goal Achievement: 09/20/20 Potential to Achieve Goals: Fair Progress towards PT goals: Progressing toward  goals    Frequency    Min 2X/week      PT Plan Current plan remains appropriate    Co-evaluation              AM-PAC PT "6 Clicks" Mobility   Outcome Measure  Help needed turning from your back to your side while in a flat bed without using bedrails?: A Little Help needed moving from lying on your back to sitting on the side of a flat bed without using bedrails?: A Little Help needed moving to and from a bed to a chair (including a wheelchair)?: A Lot Help needed standing up from a chair using your arms (e.g., wheelchair or bedside chair)?: A Lot Help needed to walk in hospital room?: Total Help needed climbing 3-5 steps with a railing? : Total 6 Click Score: 12    End of Session   Activity Tolerance: Patient tolerated treatment well Patient left: in bed;with call bell/phone within reach;with nursing/sitter in room Nurse Communication: Mobility status PT Visit Diagnosis: Unsteadiness  on feet (R26.81);Muscle weakness (generalized) (M62.81);History of falling (Z91.81);Repeated falls (R29.6);Difficulty in walking, not elsewhere classified (R26.2)     Time: 1550-1605 PT Time Calculation (min) (ACUTE ONLY): 15 min  Charges:  $Therapeutic Activity: 8-22 mins                     Folasade Mooty, PT, GCS 09/10/20,4:17 PM

## 2020-09-10 NOTE — Progress Notes (Signed)
Initial Nutrition Assessment  DOCUMENTATION CODES:   Non-severe (moderate) malnutrition in context of chronic illness  INTERVENTION:   -D/c Prosource Plus  -D/c Ensure Enlive -Magic cup TID with meals, each supplement provides 290 kcal and 9 grams of protein -MVI with minerals daily -Downgrade diet to dysphagia 3 (advanced mechanical soft) for ease of intake  NUTRITION DIAGNOSIS:   Moderate Malnutrition related to chronic illness (DM, chronic pain syndrome) as evidenced by mild fat depletion, moderate fat depletion, mild muscle depletion, moderate muscle depletion.  GOAL:   Patient will meet greater than or equal to 90% of their needs  MONITOR:   PO intake, Supplement acceptance, Labs, Weight trends, Skin, I & O's  REASON FOR ASSESSMENT:   Malnutrition Screening Tool    ASSESSMENT:   [name] is a 64 year old male with PMHx of diabetes mellitus, hypertension, hyperlipidemia, recent left hip infection and chronic pain syndrome presenting following a fall.  Pt admitted with fall and lower extremity weakness.   Reviewed I/O's: -350 ml x 24 hours and -1.1 L since admission  UOP: 350 ml x 24 hours  Spoke with pt, who was very anxious at time of visit. He is very concerned about his MRI and results.   Pt reports a decreased appetite over the past 3 months. He explains that he just returned home after a 2 month stay at [facility]. He shares that he was eating less PTA, however, did not provide specific details. Pt reports his UBW is around 300# and he has lost about 100# over the past few months.   Pt reports being hungry; lunch just served at time of visit and nurse to help set up pt to eat. Noted meal completion 50-100%.   Discussed importance of good meal and supplement intake to promote healing. Pt is refusing Prosource Plus and Ensure supplements.   Lab Results  Component Value Date   HGBA1C 6.9 (H) 07/15/2020   PTA DM medications are none.   Labs reviewed: CBGS:  89-90 (inpatient orders for glycemic control are 0-9 units insulin aspart TID with meals).   NUTRITION - FOCUSED PHYSICAL EXAM:    Most Recent Value  Orbital Region Mild depletion  Upper Arm Region Mild depletion  Thoracic and Lumbar Region No depletion  Buccal Region No depletion  Temple Region Mild depletion  Clavicle Bone Region No depletion  Clavicle and Acromion Bone Region No depletion  Scapular Bone Region No depletion  Dorsal Hand Mild depletion  Patellar Region Moderate depletion  Anterior Thigh Region Moderate depletion  Posterior Calf Region Moderate depletion  Edema (RD Assessment) None  Hair Reviewed  Eyes Reviewed  Mouth Reviewed  Skin Reviewed  Nails Reviewed       Diet Order:   Diet Order            Diet heart healthy/carb modified Room service appropriate? Yes; Fluid consistency: Thin  Diet effective now                 EDUCATION NEEDS:   Education needs have been addressed  Skin:  Skin Assessment: Skin Integrity Issues: Skin Integrity Issues:: Other (Comment) Other: MASD ITD to abdominal pannus;chronic, non-healing left hip wound along prior incision line  Last BM:  09/07/20  Height:   Ht Readings from Last 1 Encounters:  09/05/20 5\' 10"  (1.778 m)    Weight:   Wt Readings from Last 1 Encounters:  09/05/20 99.8 kg    Ideal Body Weight:  75.5 kg  BMI:  Body mass index is 31.57 kg/m.  Estimated Nutritional Needs:   Kcal:  5456-2563  Protein:  115-130 grams  Fluid:  > 2 L    Levada Schilling, RD, LDN, CDCES Registered Dietitian II Certified Diabetes Care and Education Specialist Please refer to [facility] for RD and/or RD on-call/weekend/after hours pager

## 2020-09-10 NOTE — Progress Notes (Addendum)
Subjective: Patient had no overnight events. Night team did go to talk to the patient about possibly trying to take medication to help with his anxiety associated with MRI. This AM patient reports that he feels "sick." He does not endorse nausea, chest pain, or SOB. He does report significant lumbar pain and bilateral leg spasms and pain. He also reports that he is ready to attempt imaging and is willing to try medication to help ease his anxiety.  Objective:  Vital signs in last 24 hours: Vitals:   09/09/20 0507 09/09/20 1550 09/09/20 2031 09/10/20 0547  BP: 122/66 118/61 130/63 (!) 146/74  Pulse: 96 84 98 89  Resp: 17 18 17 16   Temp: 98 F (36.7 C)  98.6 F (37 C) 98.8 F (37.1 C)  TempSrc: Oral  Oral Oral  SpO2: 100% 100% 99% 99%  Weight:      Height:       Physical Exam HENT:     Head: Normocephalic and atraumatic.  Cardiovascular:     Rate and Rhythm: Normal rate and regular rhythm.  Pulmonary:     Effort: Pulmonary effort is normal.     Breath sounds: Normal breath sounds.  Abdominal:     General: Bowel sounds are normal.     Palpations: Abdomen is soft.  Skin:    General: Skin is warm.     Comments: Intertrigo in pannus  Desquamation of his skin in his lower back and buttock.  non-blanching, erythema noted at the sacrum    Neurological:     Mental Status: He is alert.  Psychiatric:        Mood and Affect: Mood normal.     Assessment/Plan:  Principal Problem:   Fall at home Active Problems:   Diabetes mellitus (HCC)   Frailty syndrome in geriatric patient   Physical deconditioning   Loss of weight   Candidal intertrigo  [name] is a 64 year old male with PMHx of hypertension, diabetes mellitus, hyperlipidemia, recent left upper thigh abscess s/p I&D, chronic pain syndrome presenting after a mechanical fall without injury secondary to a one month history of bilateral lower extremity weakness with fecal incontinence.   Fall Lower Extremity  Weakness  Patient continues to have lower back pain and left sided weakness with bilateral LE pain precluding him from walking.  Requiring moderate two-person assist for bed mobility and to stand at the edge of bed.  The cause of his diffuse sarcopenia, physical deconditioning, functional decline is unclear at this time, but certainly very impactful to his future quality of life and he will remain at high risk for falls if we cannot find the underlying etiology. - MRIs of thoracic and cervical spine pending - Neurology consult pending MRI results - Lidocaine patch    Weight loss (50 lb) Night sweats  We are concerned that the patient has an underlying malignancy or inflammatory process that could cause his symptoms.  -CT chest abdomen pelvis to look for solid malignancy - Ativan 1mg   Fecal incontinence Unclear of the etiology of his fecal incontinence. No sacral or lumbar lesion was found on MRI.  Greatly appreciate neurosurgery consultation.   Muscle spasms Patient continues to have muscles spasms in BLE. - Continue Baclofen 10 mg BID   Bilateral plantar desquamation, erythematous papular eruption of trunk and arms Improvement noted this AM, patient does continue to have some intertrigo that wraps around from the groin to his lumbar area and buttocks. - Triamcinolone/Eucerin cream prn  Infrapannicular  intertrigo - Continue Nystatin powder TID and Fluconazole 200 mg for 7 days (5/7)   Bilateral Arm Rash Improved today. Mild erythema is noted to arms in comparison to yesterday.  - Triamcinolone/ Eucerin   LLE thigh wound - Continue wound care   Chronic pain syndrome - Continue home oxycodone 10 mg q6hrs PRN--encouraging him to request this when needed   Diabetes mellitus - CBG monitoring - SSI tid with meals Patient required on 2U SAI yesterday.   Prior to Admission Living Arrangement: SNF Anticipated Discharge Location: SNF Barriers to Discharge:Treatment Dispo:  Anticipated discharge in approximately 1-2 day(s).   Bobbye Morton, MD 09/10/2020, 12:42 PM Pager: [phone] After 5pm on weekdays and 1pm on weekends: On Call pager [phone]

## 2020-09-11 ENCOUNTER — Inpatient Hospital Stay (HOSPITAL_COMMUNITY): Payer: Medicare Other

## 2020-09-11 DIAGNOSIS — E440 Moderate protein-calorie malnutrition: Secondary | ICD-10-CM | POA: Insufficient documentation

## 2020-09-11 DIAGNOSIS — Y92009 Unspecified place in unspecified non-institutional (private) residence as the place of occurrence of the external cause: Secondary | ICD-10-CM

## 2020-09-11 DIAGNOSIS — R531 Weakness: Secondary | ICD-10-CM

## 2020-09-11 DIAGNOSIS — W19XXXA Unspecified fall, initial encounter: Secondary | ICD-10-CM

## 2020-09-11 DIAGNOSIS — G6289 Other specified polyneuropathies: Secondary | ICD-10-CM

## 2020-09-11 LAB — GLUCOSE, CAPILLARY
Glucose-Capillary: 104 mg/dL — ABNORMAL HIGH (ref 70–99)
Glucose-Capillary: 119 mg/dL — ABNORMAL HIGH (ref 70–99)
Glucose-Capillary: 126 mg/dL — ABNORMAL HIGH (ref 70–99)
Glucose-Capillary: 136 mg/dL — ABNORMAL HIGH (ref 70–99)

## 2020-09-11 LAB — C-REACTIVE PROTEIN: CRP: 2 mg/dL — ABNORMAL HIGH (ref <1.0)

## 2020-09-11 LAB — SEDIMENTATION RATE: Sed Rate: 30 mm/hr — ABNORMAL HIGH (ref 0–16)

## 2020-09-11 MED ORDER — IOHEXOL 300 MG/ML  SOLN
100.0000 mL | Freq: Once | INTRAMUSCULAR | Status: AC | PRN
  Administered 2020-09-11: 100 mL via INTRAVENOUS

## 2020-09-11 MED ORDER — LORAZEPAM 1 MG PO TABS
1.0000 mg | ORAL_TABLET | Freq: Once | ORAL | Status: AC
  Administered 2020-09-11: 1 mg via ORAL
  Filled 2020-09-11: qty 1

## 2020-09-11 MED ORDER — IOHEXOL 9 MG/ML PO SOLN
500.0000 mL | ORAL | Status: AC
  Administered 2020-09-11 (×2): 500 mL via ORAL

## 2020-09-11 NOTE — Evaluation (Signed)
Clinical/Bedside Swallow Evaluation Patient Details  Name: [name] MRN: [id number] Date of Birth: [birth year]  Today's Date: 09/11/2020 Time: SLP Start Time (ACUTE ONLY): 1212 SLP Stop Time (ACUTE ONLY): 1219 SLP Time Calculation (min) (ACUTE ONLY): 7 min  Past Medical History:  Past Medical History:  Diagnosis Date  . Cellulitis 07/17/2020   LEFT HIP  . Diabetes mellitus   . Hyperlipidemia   . Hypertension    Past Surgical History:  Past Surgical History:  Procedure Laterality Date  . SPINE SURGERY     HPI:  [name] is a 64 year old male with PMHx of hypertension, diabetes mellitus, hyperlipidemia, recent left upper thigh abscess s/p I&D, chronic pain syndrome presenting after a mechanical fall without injury secondary to a one month history of bilateral lower extremity weakness with fecal incontinence. MRI 9/20 with no acute findings. New chest imaging ordered, but not yet available.  CXR during prior admission in August with possible pneumonia.   Assessment / Plan / Recommendation Clinical Impression  Pt presents with functional swallowing as assessed clinically. There were no clinical s/s of aspiration with any consistencies trialed.  Pt exhibited prolonged oral phase with solids with eventual complete oral clearance.  Pt reports very poor appetite and has recently experienced ~120 lb weight loss.  Reports no difficulty eating outside of lack of appetite.  Pt was recently admitted in August with CXR concerning for "Left greater than right lower lobe airspace opacities" which was concerning for possible pneumonia at that time.  New chest imaging has been ordered but is not yet available.  Consider MBSS if CXR is concerning for pna.  Recomend continuing current mechanical soft solids with thin liquids. SLP Visit Diagnosis: Dysphagia, unspecified (R13.10)    Aspiration Risk  No limitations    Diet Recommendation Dysphagia 3 (Mech soft);Thin liquid   Liquid  Administration via: Cup;Straw Medication Administration: Whole meds with liquid Supervision: Patient able to self feed Compensations: Slow rate;Small sips/bites Postural Changes: Seated upright at 90 degrees    Other  Recommendations Oral Care Recommendations: Oral care BID   Follow up Recommendations   N/A     Frequency and Duration   N/A         Prognosis   N/A     Swallow Study   General Date of Onset: 09/11/20 HPI: [name] is a 64 year old male with PMHx of hypertension, diabetes mellitus, hyperlipidemia, recent left upper thigh abscess s/p I&D, chronic pain syndrome presenting after a mechanical fall without injury secondary to a one month history of bilateral lower extremity weakness with fecal incontinence. MRI 9/20 with no acute findings. New chest imaging ordered, but not yet available.  CXR during prior admission in August with possible pneumonia. Type of Study: Bedside Swallow Evaluation Previous Swallow Assessment: None Diet Prior to this Study: Dysphagia 3 (soft);Thin liquids Temperature Spikes Noted: No Respiratory Status: Room air History of Recent Intubation: No Behavior/Cognition: Alert;Cooperative;Pleasant mood Oral Cavity Assessment: Within Functional Limits Oral Care Completed by SLP: No Oral Cavity - Dentition: Adequate natural dentition;Missing dentition Vision: Functional for self-feeding Self-Feeding Abilities: Able to feed self Patient Positioning: Upright in bed Baseline Vocal Quality: Normal Volitional Cough: Strong Volitional Swallow: Able to elicit    Oral/Motor/Sensory Function Overall Oral Motor/Sensory Function: Mild impairment Facial ROM: Within Functional Limits Facial Symmetry: Within Functional Limits Lingual ROM: Within Functional Limits Lingual Symmetry: Abnormal symmetry right Lingual Strength: Within Functional Limits Velum: Within Functional Limits Mandible: Within Functional Limits   Ice  Chips Ice chips: Not tested    Thin Liquid Thin Liquid: Within functional limits Presentation: Straw    Nectar Thick Nectar Thick Liquid: Not tested   Honey Thick Honey Thick Liquid: Not tested   Puree Puree: Within functional limits Presentation: Spoon   Solid     Solid: Within functional limits Presentation: Self Fed Oral Phase Functional Implications:  (Prolonged mastication)      Walter Pleasure, MA, CCC-SLP Acute Rehabilitation Services Office: [phone]; Pager (9/21): [phone] 09/11/2020,12:35 PM

## 2020-09-11 NOTE — Progress Notes (Addendum)
Subjective: Patient had no overnight events. He reports back pain today although it feels the lidocaine helped "a little." Patient did not eat yesterday and does not feel like eating today as well.   Objective:  Vital signs in last 24 hours: Vitals:   09/09/20 2031 09/10/20 0547 09/10/20 2145 09/11/20 0523  BP: 130/63 (!) 146/74 131/69 (!) 154/75  Pulse: 98 89 87 85  Resp: 17 16 17 16   Temp: 98.6 F (37 C) 98.8 F (37.1 C) 98.9 F (37.2 C) 97.8 F (36.6 C)  TempSrc: Oral Oral Oral Oral  SpO2: 99% 99% 100% 100%  Weight:      Height:       Physical Exam Cardiovascular:     Rate and Rhythm: Normal rate and regular rhythm.  Pulmonary:     Effort: Pulmonary effort is normal.     Breath sounds: Normal breath sounds.  Abdominal:     General: Bowel sounds are normal.     Palpations: Abdomen is soft.  Musculoskeletal:     Comments: Back pain this AM, weakness- unable to roll without assistance  Neurological:     Mental Status: He is alert.  Psychiatric:     Comments: Depressed mood and flat affect     A/P:  Principal Problem:   Fall at home Active Problems:   Diabetes mellitus (HCC)   Frailty syndrome in geriatric patient   Physical deconditioning   Loss of weight   Candidal intertrigo   Malnutrition of moderate degree   [name] is a 64 year old male with PMHx of hypertension, diabetes mellitus, hyperlipidemia, recent left upper thigh abscess s/p I&D, chronic pain syndrome presenting after a mechanical fall without injury secondary to a one month history of bilateral lower extremity weakness with fecal incontinence.   Fall Lower Extremity Weakness  Patient continues to have lower back pain and left sided weakness with bilateral LE pain precluding him from walking.  Requiring moderate two-person assist for bed mobility and to stand at the edge of bed.  The cause of his diffuse sarcopenia, physical deconditioning, functional decline is unclear at this time, but  certainly very impactful to his future quality of life and he will remain at high risk for falls if we cannot find the underlying etiology. MRI Brain, cervical, and thoracic spine did not have findings that would explain patient's symptoms. Only mild elevation in inflammatory markers, not consistent with PMR. - Neuro consult - Lidocaine patch    Unintentional Weight loss (50 lb) Night sweats At risk for underlying malignancy or inflammatory process that could cause his symptoms. SLP saw patient this AM and the patient swallowed well, but SLP is aware that patient had possible pneumonia on previous imaging during his last hoptialization. Pending CT Chest today they may recommend Modified Barium swallow for possible aspiration. They will follow-up with patient tomorrow and follow up CT Chest results.  Dietician saw patient and recommend Dysphagia 3 for patient ease. Patient has had less interest in eating food. He has not had an appetite although he did eat a "moon pie." Unsure if patient does not have a taste for food vs truly feeling full. Concern that patient is depressed and exhibiting signs of lack of interest in things including decreased appetite. Patient does show some signs and concern for vascular dementia vs pseudodementia. He endorses poor sleep,has flat affect, irritability, decreased appetite, and decreased interest in doing things. This AM he only wanted to nap. May consider SSRI in the future. -  CT chest abdomen pelvis to look for solid malignancy - Ativan 1mg  for anxiety   Fecal incontinence Unclear of the etiology of his fecal incontinence. No sacral or lumbar lesion was found on MRI.  Greatly appreciate neurosurgery consultation.   Muscle spasms Patient continues to have muscles spasms in BLE. - Continue Baclofen 10 mg BID   Bilateral plantar desquamation, erythematous papular eruption of trunk and arms Improvement noted this AM, patient does continue to have some intertrigo that  wraps around from the groin to his lumbar area and buttocks. - Triamcinolone/Eucerin cream prn   Infrapannicular intertrigo - Continue Nystatin powder TID and Fluconazole 200 mg for 7 days (6/7)   Bilateral Arm Rash Improved today. Mild erythema is noted to arms in comparison to yesterday.  - Triamcinolone/ Eucerin   LLE thigh wound - Continue wound care   Chronic pain syndrome - Continue home oxycodone 10 mg q6hrs PRN--encouraging him to request this when needed   Diabetes mellitus - CBG monitoring - SSI tid with meals Patient required on 2U SAI yesterday.    Prior to Admission Living Arrangement: SNF Anticipated Discharge Location: SNF Barriers to Discharge: Treatment Dispo: Anticipated discharge in approximately 1-2 day(s).   09-15-2002, MD 09/11/2020, 11:35 AM Pager: [phone] After 5pm on weekdays and 1pm on weekends: On Call pager [phone]

## 2020-09-11 NOTE — Plan of Care (Signed)
  Problem: Education: Goal: Knowledge of General Education information will improve Description: Including pain rating scale, medication(s)/side effects and non-pharmacologic comfort measures Outcome: Progressing   Problem: Clinical Measurements: Goal: Respiratory complications will improve Outcome: Progressing Note: On room air   Problem: Nutrition: Goal: Adequate nutrition will be maintained Outcome: Progressing   Problem: Coping: Goal: Level of anxiety will decrease Outcome: Progressing   Problem: Elimination: Goal: Will not experience complications related to urinary retention Outcome: Progressing   Problem: Safety: Goal: Ability to remain free from injury will improve Outcome: Progressing   

## 2020-09-11 NOTE — Consult Note (Signed)
NEURO HOSPITALIST CONSULT NOTE   Requestig physician: Dr. Oswaldo Done  Reason for Consult: BLE weakness  History obtained from:   Patient and Chart    HPI:                                                                                                                                          [name] is an 64 y.o. male with a PMHx of HTN, DM, hyperlipidemia, recent left upper thigh abscess s/p I&D and chronic pain syndrome, who presented after a mechanical fall without injury. The fall was felt to be secondary to a one month history of bilateral lower extremity weakness. Of note, he also had been having fecal incontinence. Of note, he has had symptoms of anorexia recently, with accompanying weight loss of approximately 120 lbs.   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 He states that he had a neck operation around that time but is not aware of any history of spinal cord injury, despite the MRI findings documented below. He states that ever since the accident, he has had BLE weakness, but that over the past 3 months, coinciding with his weight loss, the BLE weakness has progressively worsened. He also endorses having first been incontinent of bowel about 2 months ago.   MRI brain on 9/20 showed no acute findings. Mild multifocal T2 hyperintense signal changes within the cerebral white matter were noted, appearing most consistent with chronic small vessel ischemia.  MRI C-spine revealed prominent bilaterally symmetric focal T2 hyperintense signal abnormalities and atrophy involving the cord at the C5-6 level, the appearance being most consistent with chronic myelomalacia. Multilevel spondylosis and sequela of C5-6 ACDF were also noted.  MRI T-spine showed no findings to further explain the BLE weakness. Multilevel spondylosis most prominent in the  midthoracic spine without significant spinal canal or neural foraminal narrowing, as well as a small right paracentral T6-7 protrusion abutting the ventral cord were noted.  MRI L-spine revealed chronic degenerative spondylosis with no conus medullaris or cauda equina compression. Noted were a bulky right far-lateral spur at L2-3 impinging on the right L2 nerve root and L5-S1 left subarticular recess narrowing that could affect the left S1 nerve root.  Past Medical History:  Diagnosis Date  . Cellulitis 07/17/2020   LEFT HIP  . Diabetes mellitus   . Hyperlipidemia   . Hypertension     Past Surgical History:  Procedure Laterality Date  . SPINE SURGERY      History reviewed. No pertinent family history.            Social History:  reports that he has been smoking cigarettes. He has a 20.00  pack-year smoking history. He has never used smokeless tobacco. He reports that he does not drink alcohol and does not use drugs.  Allergies  Allergen Reactions  . Lipitor [Atorvastatin Calcium] Other (See Comments)    Muscle pain    MEDICATIONS:                                                                                                                     Prior to Admission:  Medications Prior to Admission  Medication Sig Dispense Refill Last Dose  . acetaminophen (TYLENOL) 500 MG tablet Take 2 tablets (1,000 mg total) by mouth 3 (three) times daily. 30 tablet 0 09/05/2020 at Unknown time  . baclofen (LIORESAL) 10 MG tablet Take 10 mg by mouth every 8 (eight) hours.   09/05/2020 at Unknown time  . gabapentin (NEURONTIN) 600 MG tablet Take 600 mg by mouth every 8 (eight) hours.   09/05/2020 at Unknown time  . ibuprofen (ADVIL) 400 MG tablet Take 400 mg by mouth every 6 (six) hours as needed for moderate pain.   09/05/2020 at Unknown time  . Nutritional Supplements (ENSURE PO) Take 1 Bottle by mouth daily with breakfast.   unknown  . Oxycodone HCl 10 MG TABS Take 10 mg by mouth every 8 (eight)  hours.   09/05/2020 at Unknown time  . gabapentin (NEURONTIN) 400 MG capsule Take 1 capsule (400 mg total) by mouth 3 (three) times daily. (Patient not taking: Reported on 09/06/2020) 90 capsule 0 Not Taking at Unknown time  . [EXPIRED] oxyCODONE (OXY IR/ROXICODONE) 5 MG immediate release tablet Take 2 tablets (10 mg total) by mouth every 6 (six) hours as needed for moderate pain or severe pain. (Patient not taking: Reported on 09/06/2020) 240 tablet 0 Not Taking at Unknown time   Scheduled: . baclofen  10 mg Oral BID  . enoxaparin (LOVENOX) injection  40 mg Subcutaneous Q24H  . insulin aspart  0-15 Units Subcutaneous TID WC  . lidocaine  1 patch Transdermal Q24H  . multivitamin with minerals  1 tablet Oral Daily  . nystatin   Topical BID  . polyethylene glycol  17 g Oral Q24H  . triamcinolone 0.1 % cream : eucerin   Topical TID     ROS:  The patient is a poor historian, making assessment of ROS difficult. He does state that he has BLE cramping and left hip pain at the site of the drained abscess.    Blood pressure (!) 119/55, pulse 90, temperature 98.9 F (37.2 C), temperature source Oral, resp. rate 17, height  (1.778 m), weight 99.8 kg, SpO2 98 %.   General Examination:                                                                                                       Physical Exam  HEENT-  [place]/AT Lungs- Respirations unlabored Extremities- Clear evidence of recent weight loss, with loose skin noted to upper and lower extremities bilaterally.   Neurological Examination Mental Status: Awake and alert. Oriented to hospital, city and state, but not the day of the week, the month or the year. Able to follow all commands. Speech is fluent, but perseverates at times. Can name a thumb and pinky, but not forefinger.  Cranial Nerves: II: Visual fields  intact with no extinction to DSS. PERRL.  III,IV, VI: No ptosis. EOMI with saccadic pursuits noted.   V,VII: Smile symmetric. Facial temp sensation equal bilaterally VIII: hearing intact to voice IX,X: No hoarseness noted XI: Symmetric shoulder shrug XII: Midline tongue extension Motor: BUE 4+/5 proximally and distally without asymmetry RLE 4/5 hip flexion, knee extension, knee flexion. Has foot discomfort limiting testing of ADF and APF.  LLE 4-/5 hip flexion, knee extension, knee flexion. Has foot discomfort limiting testing of ADF and APF. Also with left hip pain limiting effort.  No asterixis noted.  Sensory: Temp intact to BUE and proximal lower extremities. There is a proximal-distal sensory gradient to temperature below the knees. Allodynia and hyperalgesia to dorsal and plantar aspects of feet bilaterally in the context of subjective paresthesias. FT intact proximally x 4, without extinction. Decreased FT to feet bilaterally.  Deep Tendon Reflexes: 2+ bilateral brachioradialis and biceps. 1+ bilateral patellae. 0 bilateral achilles.  Plantars: Equivocal on the right, upgoing on the left.  Cerebellar: No ataxia with FNF bilaterally. Unable to perform with BLE due to effort dependence and weakness.  Gait: Unable to assess   Lab Results: Basic Metabolic Panel: Recent Labs  Lab 09/05/20 1414 09/06/20 0036 09/06/20 1610 09/06/20 9604 09/07/20 0717 09/07/20 0717 09/08/20 0757 09/09/20 0444 09/10/20 0044  NA   < >  --  138  --  137  --  140 139 141  K   < >  --  4.5  --  4.0  --  4.2 3.9 3.9  CL   < >  --  106  --  110  --  112* 112* 111  CO2   < >  --  22  --  19*  --  20* 22 21*  GLUCOSE   < >  --  131*  --  116*  --  117* 98 104*  BUN   < >  --  25*  --  22  --  CREATININE   < >  --  1.15  --  1.02  --  0.94 0.96 0.97  CALCIUM   < >  --  9.2   < > 8.2*   < > 8.3* 8.2* 8.5*  MG  --  2.0  --   --   --   --   --   --   --    < > = values in this interval not  displayed.    CBC: Recent Labs  Lab 09/06/20 0635 09/07/20 0717 09/08/20 0757 09/09/20 0444 09/10/20 0044  WBC 9.9 9.0 8.3 7.9 8.9  NEUTROABS 7.0  --   --   --  5.3  HGB 14.6 11.0* 10.7* 10.9* 10.8*  HCT 47.1 33.7* 33.6* 33.7* 33.5*  MCV 102.2* 97.7 97.4 97.1 97.1  PLT 355 276 257 269 295    Cardiac Enzymes: Recent Labs  Lab 09/06/20 0036  CKTOTAL 63    Lipid Panel: No results for input(s): CHOL, TRIG, HDL, CHOLHDL, VLDL, LDLCALC in the last 168 hours.  Imaging: MR BRAIN WO CONTRAST  Result Date: 09/10/2020 CLINICAL DATA:  Provided history: Brain mass or lesion. Additional history provided: 1 month of fecal incontinence, numbness in perianal region, weakness in back side of legs extending into bottom of feet, numbness along the front of legs and bottom of feet. Recent falls. EXAM: MRI HEAD WITHOUT CONTRAST TECHNIQUE: Multiplanar, multiecho pulse sequences of the brain and surrounding structures were obtained without intravenous contrast. COMPARISON:  Prior head CT 06/07/2008. FINDINGS: Brain: Mild intermittent motion degradation. Cerebral volume is normal for age. Mild multifocal T2/FLAIR hyperintensity within the cerebral white matter is nonspecific, but most commonly seen on the basis of chronic small vessel ischemia. There is no acute infarct. No evidence of intracranial mass. No chronic intracranial blood products. No extra-axial fluid collection. No midline shift. Vascular: Expected proximal arterial flow voids. Skull and upper cervical spine: No focal marrow lesion. Sinuses/Orbits: Visualized orbits show no acute finding. Mild paranasal sinus mucosal thickening, most notably ethmoidal and right maxillary. Right maxillary sinus mucous retention cyst. Left sphenoid sinus air-fluid level. Small bilateral mastoid effusions. IMPRESSION: No evidence of acute intracranial abnormality. Mild multifocal T2 hyperintense signal changes within the cerebral white matter are nonspecific, but  most commonly seen on the basis of chronic small vessel ischemia. Paranasal sinus disease as described. Correlate for acute sinusitis. Small bilateral mastoid effusions. Electronically Signed   By: Jackey Loge DO   On: 09/10/2020 13:49   MR CERVICAL SPINE WO CONTRAST  Result Date: 09/10/2020 CLINICAL DATA:  Myelopathy EXAM: MRI CERVICAL AND THORACIC SPINE WITHOUT CONTRAST TECHNIQUE: Multiplanar and multiecho pulse sequences of the cervical spine, to include the craniocervical junction and cervicothoracic junction, and the thoracic spine, were obtained without intravenous contrast. COMPARISON:  10/31/2008 MRI cervical spine and prior. FINDINGS: MRI CERVICAL SPINE FINDINGS Alignment: Normal. Sequela of C5-6 ACDF. Associated susceptibility artifact limits evaluation. Ossification of the posterior longitudinal ligament. Vertebrae: Diffuse bone marrow heterogeneity.  No focal lesions. Cord: Focal T2 hyperintense signal and atrophy involving the cord at the C5-6 level (3:8). Posterior Fossa, vertebral arteries: Please see concurrent MRI head. Disc levels: Multilevel desiccation and disc space loss. Prominent bridging anterior osteophytosis at the C3-4 level. C2-3: No significant disc bulge. Patent spinal canal and neural foramen. C3-4: Disc osteophyte complex with uncovertebral and bilateral facet hypertrophy. Mild spinal canal and moderate bilateral neural foraminal narrowing. C4-5: Disc osteophyte complex with superimposed central protrusion, uncovertebral and bilateral facet hypertrophy. Mild spinal canal, severe right and moderate left neural foraminal  narrowing. C5-6: Sequela of fusion.  Patent spinal canal neural foramen. C6-7: Disc osteophyte complex with uncovertebral and bilateral facet hypertrophy. Mild spinal canal and bilateral neural foraminal narrowing. C7-T1: No significant disc bulge, spinal canal or neural foraminal narrowing. Paraspinal tissues: Negative. MRI THORACIC SPINE FINDINGS Image quality is  degraded by motion artifact. Alignment:  Normal. Vertebrae: Diffuse bone marrow heterogeneity. Scattered T1/T2 hyperintense foci may reflect hemangiomata versus focal fat. Cord:  Normal signal and morphology. Paraspinal and other soft tissues: Negative. Disc levels: Multilevel desiccation with mild disc space loss and Schmorl's node formation most prominent at the T3-7 levels. Multilevel mild disc bulges and facet hypertrophy. Small right paracentral protrusion the T6-7 level abutting the ventral cord. No significant spinal canal narrowing. Patent bilateral neural foramen. IMPRESSION: MRI CERVICAL SPINE: Myelomalacia at the C5-6 level. Multilevel spondylosis and sequela of C5-6 ACDF. Moderate bilateral C3-4 and moderate to severe bilateral C4-5 neural foraminal narrowing. Mild spinal canal narrowing at the C3-5 and C6-7 levels. MRI THORACIC SPINE: Multilevel spondylosis most prominent in the midthoracic spine without significant spinal canal or neural foraminal narrowing. Small right paracentral T6-7 protrusion abutting the ventral cord. Motion degraded exam. Electronically Signed   By: Stana Bunting M.D.   On: 09/10/2020 14:13   MR THORACIC SPINE WO CONTRAST  Result Date: 09/10/2020 CLINICAL DATA:  Myelopathy EXAM: MRI CERVICAL AND THORACIC SPINE WITHOUT CONTRAST TECHNIQUE: Multiplanar and multiecho pulse sequences of the cervical spine, to include the craniocervical junction and cervicothoracic junction, and the thoracic spine, were obtained without intravenous contrast. COMPARISON:  10/31/2008 MRI cervical spine and prior. FINDINGS: MRI CERVICAL SPINE FINDINGS Alignment: Normal. Sequela of C5-6 ACDF. Associated susceptibility artifact limits evaluation. Ossification of the posterior longitudinal ligament. Vertebrae: Diffuse bone marrow heterogeneity.  No focal lesions. Cord: Focal T2 hyperintense signal and atrophy involving the cord at the C5-6 level (3:8). Posterior Fossa, vertebral arteries: Please  see concurrent MRI head. Disc levels: Multilevel desiccation and disc space loss. Prominent bridging anterior osteophytosis at the C3-4 level. C2-3: No significant disc bulge. Patent spinal canal and neural foramen. C3-4: Disc osteophyte complex with uncovertebral and bilateral facet hypertrophy. Mild spinal canal and moderate bilateral neural foraminal narrowing. C4-5: Disc osteophyte complex with superimposed central protrusion, uncovertebral and bilateral facet hypertrophy. Mild spinal canal, severe right and moderate left neural foraminal narrowing. C5-6: Sequela of fusion.  Patent spinal canal neural foramen. C6-7: Disc osteophyte complex with uncovertebral and bilateral facet hypertrophy. Mild spinal canal and bilateral neural foraminal narrowing. C7-T1: No significant disc bulge, spinal canal or neural foraminal narrowing. Paraspinal tissues: Negative. MRI THORACIC SPINE FINDINGS Image quality is degraded by motion artifact. Alignment:  Normal. Vertebrae: Diffuse bone marrow heterogeneity. Scattered T1/T2 hyperintense foci may reflect hemangiomata versus focal fat. Cord:  Normal signal and morphology. Paraspinal and other soft tissues: Negative. Disc levels: Multilevel desiccation with mild disc space loss and Schmorl's node formation most prominent at the T3-7 levels. Multilevel mild disc bulges and facet hypertrophy. Small right paracentral protrusion the T6-7 level abutting the ventral cord. No significant spinal canal narrowing. Patent bilateral neural foramen. IMPRESSION: MRI CERVICAL SPINE: Myelomalacia at the C5-6 level. Multilevel spondylosis and sequela of C5-6 ACDF. Moderate bilateral C3-4 and moderate to severe bilateral C4-5 neural foraminal narrowing. Mild spinal canal narrowing at the C3-5 and C6-7 levels. MRI THORACIC SPINE: Multilevel spondylosis most prominent in the midthoracic spine without significant spinal canal or neural foraminal narrowing. Small right paracentral T6-7 protrusion  abutting the ventral cord. Motion degraded exam. Electronically Signed  By: Stana Buntinghikanele  Emekauwa M.D.   On: 09/10/2020 14:13   CT CHEST ABDOMEN PELVIS W CONTRAST  Result Date: 09/11/2020 CLINICAL DATA:  Weight loss. Hypertension. Diabetes. Chronic pain syndrome. Recent fall. EXAM: CT CHEST, ABDOMEN, AND PELVIS WITH CONTRAST TECHNIQUE: Multidetector CT imaging of the chest, abdomen and pelvis was performed following the standard protocol during bolus administration of intravenous contrast. CONTRAST:  100mL OMNIPAQUE IOHEXOL 300 MG/ML  SOLN COMPARISON:  Spine MRI examinations from 09/10/2020. FINDINGS: CT CHEST FINDINGS Cardiovascular: Left anterior descending, circumflex, and right coronary artery atherosclerotic calcification. Left upper extremity IV contrast injection with considerable collateralization suggesting stenosis of the left subclavian vein just below the clavicle. Mediastinum/Nodes: Small bilateral axillary lymph nodes are not pathologically enlarged. Lungs/Pleura: Centrilobular emphysema. Airway thickening is present, suggesting bronchitis or reactive airways disease. Mild atelectasis or scarring posteriorly in the left upper lobe along the major fissure. Subsegmental atelectasis or scarring in the posterior basal segment left lower lobe. Musculoskeletal: Degenerative sternoclavicular arthropathy. Thoracic spondylosis. CT ABDOMEN PELVIS FINDINGS Hepatobiliary: 0.9 cm in diameter sharply defined hypodense lesion of the dome of the liver on image 53/3, probably a cyst. Gallbladder unremarkable. No appreciable biliary dilatation. Pancreas: Unremarkable Spleen: Unremarkable Adrenals/Urinary Tract: 1.8 by 1.7 cm fluid density exophytic lesion of the right kidney upper pole on image 63/3, probably a benign cyst. The adrenal glands appear normal. Stomach/Bowel: Diverticulum of the transverse duodenum without findings of inflammation. Normal appendix. Prominent stool throughout the colon favors  constipation. Sigmoid colon diverticulosis. Scattered air-fluid levels in nondilated loops of small bowel, nonspecific. Vascular/Lymphatic: Infrarenal IVC filter appears satisfactorily position. Aortoiliac atherosclerotic vascular disease. No pathologic adenopathy identified. Reproductive: Unremarkable Other: Diffuse subcutaneous edema, particularly tracking along the flanks and lateral to the hips. Musculoskeletal: Fused sacroiliac joints. Prior left posterior acetabular repair. Bilateral hip arthropathy, bilateral degenerative hip arthropathy, left greater than right. High density along the infra pannicular fold, probably due to treatment for intertrigo. IMPRESSION: 1. A specific cause for the patient's weight loss is not identified. 2. Other imaging findings of potential clinical significance: Coronary atherosclerosis. Airway thickening is present, suggesting bronchitis or reactive airways disease. Prominent stool throughout the colon favors constipation. Sigmoid colon diverticulosis. Diffuse subcutaneous edema, particularly tracking along the flanks and lateral to the hips. Fused sacroiliac joints. Bilateral hip arthropathy, left greater than right. Prior left posterior acetabular repair. High density along the infra pannicular fold, probably due to treatment for intertrigo. 3. Emphysema and aortic atherosclerosis. Aortic Atherosclerosis (ICD10-I70.0) and Emphysema (ICD10-J43.9). Electronically Signed   By: Gaylyn RongWalter  Liebkemann M.D.   On: 09/11/2020 16:09    Assessment: 64 year old male with a PMHx of HTN, DM, hyperlipidemia, recent left upper thigh abscess s/p I&D and chronic pain syndrome, who presented after a mechanical fall without injury. The fall was felt to be secondary to a one month history of bilateral lower extremity weakness. Of note, he also had been having fecal incontinence. Of note, he has had symptoms of anorexia recently, with accompanying weight loss of approximately 120 lbs.  -- MRI brain  on 9/20 showed no acute findings. Mild multifocal T2 hyperintense signal changes within the cerebral white matter were noted, appearing most consistent with chronic small vessel ischemia. -- MRI C-spine revealed prominent bilaterally symmetric focal T2 hyperintense signal abnormalities and atrophy involving the cord at the C5-6 level, the appearance being most consistent with chronic myelomalacia. Multilevel spondylosis and sequela of C5-6 ACDF were also noted. -- MRI T-spine showed no findings to further explain the BLE weakness. Multilevel  spondylosis most prominent in the midthoracic spine without significant spinal canal or neural foraminal narrowing, as well as a small right paracentral T6-7 protrusion abutting the ventral cord were noted. -- MRI L-spine revealed chronic degenerative spondylosis with no conus medullaris or cauda equina compression. Noted were a bulky right far-lateral spur at L2-3 impinging on the right L2 nerve root and L5-S1 left subarticular recess narrowing that could affect the left S1 nerve root. -- Exam reveals diffuse bilateral upper and lower extremity weakness, significantly worse in BLE. Also with stocking distribution sensory loss and allodynia/paresthesias involving the feet bilaterally, suggestive of a peripheral sensory neuropathy. Reflexes are preserved, except at the ankles.  -- MRI and exam findings suggest that the chronic cervical myelopathy most likely is a significant contributing factor to his BLE weakness. Deconditioning from decreased mobility secondary to recent left hip abscess with drainage, weight loss, and neuropathic pain are also likely contributing factors. Overall impression is that his weakness is multifactorial.  -- Bowel incontinence. No cauda equina or conus medullaris compression or intrinsic lesion seen on MRI. -- Of note, no muscle fasciculations are seen on exam, but a motor neuropathy is still possible.  -- Presence of reflexes militates  against CIDP or AIDP  Recommendations: 1. Outpatient EMG/NCS of lower extremities and paraspinal muscles to assess possible axonal neuropathy versus motor neuron disease.  2. PT/OT 3. Management of healing left thigh abscess s/p drainage 4. Limit/decrease medications with muscle-relaxing effects. This may be a difficult balance to achieve, as the patient also complains of painful muscle cramps.  5. Work up for underlying etiology of his 120 lb weight loss.  6. Thiamine, copper, vitamin E and B12 levels (ordered).    Electronically signed: Dr. Caryl Pina 09/11/2020, 9:09 PM

## 2020-09-12 LAB — CBC WITH DIFFERENTIAL/PLATELET
Abs Immature Granulocytes: 0.02 10*3/uL (ref 0.00–0.07)
Basophils Absolute: 0.1 10*3/uL (ref 0.0–0.1)
Basophils Relative: 1 %
Eosinophils Absolute: 0.7 10*3/uL — ABNORMAL HIGH (ref 0.0–0.5)
Eosinophils Relative: 9 %
HCT: 32.9 % — ABNORMAL LOW (ref 39.0–52.0)
Hemoglobin: 10.6 g/dL — ABNORMAL LOW (ref 13.0–17.0)
Immature Granulocytes: 0 %
Lymphocytes Relative: 23 %
Lymphs Abs: 1.8 10*3/uL (ref 0.7–4.0)
MCH: 31.4 pg (ref 26.0–34.0)
MCHC: 32.2 g/dL (ref 30.0–36.0)
MCV: 97.3 fL (ref 80.0–100.0)
Monocytes Absolute: 0.6 10*3/uL (ref 0.1–1.0)
Monocytes Relative: 8 %
Neutro Abs: 4.6 10*3/uL (ref 1.7–7.7)
Neutrophils Relative %: 59 %
Platelets: 307 10*3/uL (ref 150–400)
RBC: 3.38 MIL/uL — ABNORMAL LOW (ref 4.22–5.81)
RDW: 14.1 % (ref 11.5–15.5)
WBC: 7.8 10*3/uL (ref 4.0–10.5)
nRBC: 0 % (ref 0.0–0.2)

## 2020-09-12 LAB — GLUCOSE, CAPILLARY
Glucose-Capillary: 93 mg/dL (ref 70–99)
Glucose-Capillary: 157 mg/dL — ABNORMAL HIGH (ref 70–99)
Glucose-Capillary: 102 mg/dL — ABNORMAL HIGH (ref 70–99)
Glucose-Capillary: 158 mg/dL — ABNORMAL HIGH (ref 70–99)

## 2020-09-12 LAB — BASIC METABOLIC PANEL
Anion gap: 4 — ABNORMAL LOW (ref 5–15)
BUN: 16 mg/dL (ref 8–23)
CO2: 24 mmol/L (ref 22–32)
Calcium: 8.5 mg/dL — ABNORMAL LOW (ref 8.9–10.3)
Chloride: 111 mmol/L (ref 98–111)
Creatinine, Ser: 0.97 mg/dL (ref 0.61–1.24)
GFR calc Af Amer: >60 mL/min (ref >60)
GFR calc non Af Amer: >60 mL/min (ref >60)
Glucose, Bld: 103 mg/dL — ABNORMAL HIGH (ref 70–99)
Potassium: 4.4 mmol/L (ref 3.5–5.1)
Sodium: 139 mmol/L (ref 135–145)

## 2020-09-12 LAB — VITAMIN B12: Vitamin B-12: 350 pg/mL (ref 180–914)

## 2020-09-12 NOTE — Care Management Important Message (Signed)
Important Message  Patient Details  Name: [name] MRN: [id number] Date of Birth: November 23, 1956   Medicare Important Message Given:  Yes - Important Message mailed due to current National Emergency  Verbal consent obtained due to current National Emergency  Relationship to patient: Brother/Sister Contact Name: Kenya Shiraishi Call Date: 09/12/20  Time: 1219 Phone: [phone] Outcome: Spoke with contact (Spoke with Brother patient was not able to understand) Important Message mailed to: Other (must enter comment) ([address] [place] [address])[id number] Brother ask for the information be sent to him.        Royalti Schauf 09/12/2020, 12:23 PM

## 2020-09-12 NOTE — Progress Notes (Signed)
Nutrition Follow-up  DOCUMENTATION CODES:   Non-severe (moderate) malnutrition in context of chronic illness  INTERVENTION:   -Continue Magic cup TID with meals, each supplement provides 290 kcal and 9 grams of protein -Continue MVI with minerals daily -Continue dysphagia 3 diet (advanced mechanical soft) for ease of intake  NUTRITION DIAGNOSIS:   Moderate Malnutrition related to chronic illness (DM, chronic pain syndrome) as evidenced by mild fat depletion, moderate fat depletion, mild muscle depletion, moderate muscle depletion.  Ongoing  GOAL:   Patient will meet greater than or equal to 90% of their needs  Progressing  MONITOR:   PO intake, Supplement acceptance, Labs, Weight trends, Skin, I & O's  REASON FOR ASSESSMENT:   Malnutrition Screening Tool    ASSESSMENT:   [name] is a 64 year old male with PMHx of diabetes mellitus, hypertension, hyperlipidemia, recent left hip infection and chronic pain syndrome presenting following a fall.  Reviewed I/O's: -335 ml x 24 hours and -2.2 L since admission  UOP: 575 ml x 24 hours  Pt sitting up in bed at time of visit. Observed breakfast tray- pt consumed 75% of eggs, 50% of sausage, 80% of Magic Cup, and 100% of banana for breakfast. When RD commented that appetite seems to have improved, pt reports "I ate what I could". Noted meal completions documented at 75-100%.   Medications reviewed and include miralax.   Labs reviewed: CBGS: 93-136 (inpatient orders for glycemic control are 0-15 units insulin aspart TID with meals).   Diet Order:   Diet Order            DIET DYS 3 Room service appropriate? 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  Estimated Nutritional Needs:   Kcal:  09/07/20  Protein:  115-130 grams  Fluid:  > 2 L    7026-3785, RD, LDN, CDCES Registered Dietitian II Certified Diabetes Care and Education Specialist Please refer to [facility] for RD and/or RD on-call/weekend/after hours pager

## 2020-09-12 NOTE — Progress Notes (Signed)
Physical Therapy Treatment Patient Details Name: [name] MRN: [id number] DOB: [birth year] Today's Date: 09/12/2020    History of Present Illness Pt is a 64 y/o male admitted secondary to fall and increased weakness after being d/c'd from SNF. Pt with L hip wound as well. PMH includes DM and HTN.     PT Comments    Pt supine in bed on arrival.  Pt eager to move from bed to recliner and informed nurse who informed PTA.  PTA returned and performed transfers from bed and recliner.  He require mod-max assistance and B quad very weak with noted buckling.  Continue to recommend SNF placement.      Follow Up Recommendations  SNF;Supervision/Assistance - 24 hour     Equipment Recommendations  None recommended by PT    Recommendations for Other Services       Precautions / Restrictions Precautions Precautions: Fall Restrictions Weight Bearing Restrictions: No    Mobility  Bed Mobility Overal bed mobility: Needs Assistance Bed Mobility: Supine to Sit;Sit to Supine     Supine to sit: Mod assist     General bed mobility comments: Mod assistance to raise trunk into seated position pulling on PTAs hands for support.  Once in sitting assistance to maintain balance and scoot hips to edge of bed.  Transfers Overall transfer level: Needs assistance Equipment used: Rolling walker (2 wheeled) Transfers: Sit to/from Stand Sit to Stand: Mod assist         General transfer comment: Mod assistance performed with RW and face to face support.  x1 from bed with step to recliner with RW.  Once in recliner performed additional transfers face to face to allow nursing to assess and change dressings on patient's bottom.  Ambulation/Gait Ambulation/Gait assistance: Mod assist;+2 safety/equipment Gait Distance (Feet): 2 Feet Assistive device: Rolling walker (2 wheeled) Gait Pattern/deviations: Step-to pattern;Shuffle     General Gait Details: Shuffling steps from bed to chair reaching  for recliner before backing up and slinging hips to recliner.   Stairs             Wheelchair Mobility    Modified Rankin (Stroke Patients Only)       Balance Overall balance assessment: Needs assistance;Modified Independent Sitting-balance support: No upper extremity supported;Feet supported Sitting balance-Leahy Scale: Fair       Standing balance-Leahy Scale: Poor Standing balance comment: Reliant on BUE support and external support.                             Cognition Arousal/Alertness: Awake/alert Behavior During Therapy: WFL for tasks assessed/performed Overall Cognitive Status: No family/caregiver present to determine baseline cognitive functioning                                        Exercises      General Comments        Pertinent Vitals/Pain Pain Assessment: Faces Faces Pain Scale: Hurts whole lot Pain Location: L LE Pain Descriptors / Indicators: Sore Pain Intervention(s): Monitored during session;Repositioned    Home Living                      Prior Function            PT Goals (current goals can now be found in the care plan section) Acute Rehab PT Goals Patient  Stated Goal: to go home Potential to Achieve Goals: Fair Progress towards PT goals: Progressing toward goals    Frequency    Min 2X/week      PT Plan Current plan remains appropriate    Co-evaluation              AM-PAC PT "6 Clicks" Mobility   Outcome Measure  Help needed turning from your back to your side while in a flat bed without using bedrails?: A Little Help needed moving from lying on your back to sitting on the side of a flat bed without using bedrails?: A Lot Help needed moving to and from a bed to a chair (including a wheelchair)?: A Lot Help needed standing up from a chair using your arms (e.g., wheelchair or bedside chair)?: A Lot Help needed to walk in hospital room?: Total Help needed climbing 3-5 steps  with a railing? : Total 6 Click Score: 11    End of Session Equipment Utilized During Treatment: Gait belt Activity Tolerance: Patient tolerated treatment well Patient left: in bed;with call bell/phone within reach;with nursing/sitter in room Nurse Communication: Mobility status PT Visit Diagnosis: Unsteadiness on feet (R26.81);Muscle weakness (generalized) (M62.81);History of falling (Z91.81);Repeated falls (R29.6);Difficulty in walking, not elsewhere classified (R26.2)     Time: 4315-4008 PT Time Calculation (min) (ACUTE ONLY): 17 min  Charges:  $Therapeutic Activity: 8-22 mins                     [name] , PTA Acute Rehabilitation Services Pager [phone] Office [phone]     [name] 09/12/2020, 5:30 PM

## 2020-09-12 NOTE — Progress Notes (Addendum)
Subjective: Patient reports lower back pain but appears not as bad. He reports that he is depressed and irritated and wants go home. He does not want to go to SNF.   Objective:  Vital signs in last 24 hours: Vitals:   09/11/20 0523 09/11/20 1445 09/11/20 2055 09/12/20 0505  BP: (!) 154/75 (!) 144/73 (!) 119/55 136/64  Pulse: 85 86 90 90  Resp: 16 18 17 19   Temp: 97.8 F (36.6 C) 98.9 F (37.2 C) 98.9 F (37.2 C) 98.3 F (36.8 C)  TempSrc: Oral Oral Oral Oral  SpO2: 100% 100% 98% 99%  Weight:      Height:       Physical Exam Cardiovascular:     Rate and Rhythm: Normal rate and regular rhythm.  Pulmonary:     Effort: Pulmonary effort is normal.     Breath sounds: Normal breath sounds.  Abdominal:     General: Bowel sounds are normal.     Palpations: Abdomen is soft.  Psychiatric:        Cognition and Memory: Cognition is impaired.     Comments: Irritated mood this AM     Assessment/Plan:  Principal Problem:   Fall at home Active Problems:   Diabetes mellitus (HCC)   Frailty syndrome in geriatric patient   Physical deconditioning   Loss of weight   Candidal intertrigo   Malnutrition of moderate degree  [name] is a 64 year old male with PMHx of hypertension, diabetes mellitus, hyperlipidemia, recent left upper thigh abscess s/p I&D, chronic pain syndrome presenting after a mechanical fall without injury secondary to a one month history of bilateral lower extremity weakness with fecal incontinence.   Fall Lower Extremity Weakness  Patient continues to have BLE weakness, with Bilateral muscle spasms, and lumbosacral pain. Neurology saw patient and evaluated MRI imaging.  Overall impression is that patient's weakness is multifactorial. The presence of reflexes decreased concern for CIDP and AIDP at this time. 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 They recommend that patient have  outpatient EMG of lower extremities and paraspinal muscles to assess for possible axonal neuropathy vs motor neuron disease. Patient should continue PT/OT. They recommend limiting/decreasing medication with muscle-relaxing effects. They would like the following: - F/u Thiamine - F/u Copper - Follow-up Vit E - Folow- up B12 - PT/OT -Continue Lidocaine patch  Unintentional Weight loss (50 lb) Night sweats CT Chest Abdomen Pelvis did not reveal malignancy nor other cause for patient's unintentional, significant weight loss. 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 He continues to exhibit anhedonia, depressed mood, poor sleep, change in appetite, irritability. Patient admits that he is unhappy and both depressed and irritated. - Offered patient SSRI today, he is considering  Fecal incontinence Unclear of the etiology of his fecal incontinence. No sacral or lumbar lesion was found on MRI.  Patient noted to have large stool burden on CT.  - Patient will follow-up with Neurology outpatient.   Muscle spasms Patient continues to have muscles spasms in BLE. - Continue Baclofen 10 mg BID   Bilateral plantar desquamation, erythematous papular eruption of trunk and arms Improvement noted this AM, patient does continue to have some intertrigo that wraps around from the groin to his lumbar area and buttocks. - Triamcinolone/Eucerin cream prn   Infrapannicular intertrigo - Continue Nystatin powder TID 7/14 and Fluconazole 200 mg for 7 days (7/7) - Needs  barrier protection, keep skin dry   Bilateral Arm Rash Improved today. Skin on arm appears to not have disruption. - Triamcinolone/ Eucerin   LLE thigh wound - Continue wound care   Chronic pain syndrome - Continue home oxycodone 10 mg q6hrs PRN--encouraging him to request this when needed. Patient has been requiring 30mg  daily.    Diabetes mellitus - CBG monitoring - SSI tid with  meals Patient required on 2U SAI yesterday.     Prior to Admission Living Arrangement: SNF Anticipated Discharge Location: SNF Barriers to Discharge: Treatment Dispo: Anticipated discharge in approximately 1-2 day(s).   , MD 09/12/2020, 6:38 AM Pager: [phone] After 5pm on weekdays and 1pm on weekends: On Call pager [phone]

## 2020-09-12 NOTE — Progress Notes (Signed)
PT Cancellation Note  Patient Details Name: [name] MRN: [id number] DOB: Oct 09, 1956   Cancelled Treatment:    Reason Eval/Treat Not Completed: (P) Other (comment);Patient declined, no reason specified (Pt reports, "I don't feel well, I will try tomorrow."  PTA provided max cues for encouragement and he continues to decline participation not receptive to encouragement and RN reports he is refusing interventions as well.)   VASHON RIORDAN 09/12/2020, 3:45 PM  Bonney Leitz , PTA Acute Rehabilitation Services Pager [phone] Office [phone]

## 2020-09-13 LAB — ALDOLASE: Aldolase: 7 U/L (ref 3.3–10.3)

## 2020-09-13 LAB — GLUCOSE, CAPILLARY
Glucose-Capillary: 85 mg/dL (ref 70–99)
Glucose-Capillary: 111 mg/dL — ABNORMAL HIGH (ref 70–99)
Glucose-Capillary: 170 mg/dL — ABNORMAL HIGH (ref 70–99)
Glucose-Capillary: 129 mg/dL — ABNORMAL HIGH (ref 70–99)

## 2020-09-13 LAB — LACTATE DEHYDROGENASE: LDH: 121 U/L (ref 98–192)

## 2020-09-13 LAB — SEDIMENTATION RATE: Sed Rate: 30 mm/hr — ABNORMAL HIGH (ref 0–16)

## 2020-09-13 LAB — CK: Total CK: 24 U/L — ABNORMAL LOW (ref 49–397)

## 2020-09-13 LAB — C-REACTIVE PROTEIN: CRP: 2.5 mg/dL — ABNORMAL HIGH

## 2020-09-13 MED ORDER — ESCITALOPRAM OXALATE 10 MG PO TABS
5.0000 mg | ORAL_TABLET | Freq: Every day | ORAL | Status: DC
  Administered 2020-09-13 – 2020-09-17 (×5): 5 mg via ORAL
  Filled 2020-09-13 (×5): qty 1

## 2020-09-13 NOTE — Progress Notes (Addendum)
Subjective: No overnight events. Patient more alert this morning. He reports feeling the same today as yesterday. The only pain he reports is continued lumbosacral pain. He also asks why he keeps breaking out.   Objective:  Vital signs in last 24 hours: Vitals:   09/12/20 0505 09/12/20 1503 09/12/20 2028 09/13/20 0600  BP: 136/64 (!) 111/55 128/87 116/78  Pulse: 90 81 88 87  Resp: 19 16 16 18   Temp: 98.3 F (36.8 C) 98.5 F (36.9 C) 97.7 F (36.5 C) 98.2 F (36.8 C)  TempSrc: Oral Oral  Oral  SpO2: 99% 96% 100% 100%  Weight:      Height:       Physical Exam Constitutional:      General: He is not in acute distress. HENT:     Mouth/Throat:     Mouth: Mucous membranes are dry.  Cardiovascular:     Rate and Rhythm: Normal rate and regular rhythm.  Pulmonary:     Effort: Pulmonary effort is normal.     Breath sounds: Normal breath sounds.  Abdominal:     General: Bowel sounds are normal.     Palpations: Abdomen is soft.     Tenderness: There is no abdominal tenderness.  Skin:    Comments: No rash noted on Bil UE, patient concerned about intertrigo and feet  Neurological:     General: No focal deficit present.     Mental Status: He is alert.  Psychiatric:     Comments: Patient continues to have depressed and irritable mood. He also continues to have decreased appetite and lack of interest.      Assessment/Plan:  Principal Problem:   Fall at home Active Problems:   Diabetes mellitus (HCC)   Frailty syndrome in geriatric patient   Physical deconditioning   Loss of weight   Candidal intertrigo   Malnutrition of moderate degree  Fall Lower Extremity Weakness  Patient continues to have bilateral lower extremity weakness and lumbosacral pain. Greatly appreciate neurology consultation. Imaging and labs so far do not give a medical explanation.Patient did not feel up to participating in PT yesterday, which is worsening his deconditioning. Work up for this disorder  will need to continue as an outpatient in the neurology clinic, including an EMG and nerve conduction studies.  - F/u Thiamine - F/u Copper - Follow-up Vit E - F/u IgG and IgM - F/u Myoglobin - PT/OT -Continue Lidocaine patch   Unintentional Weight loss (50 lb) Night sweats Unknown etiology for significant weight loss. Continued concern for pseudodementia vs depression in the setting of possible vascular dementia. Patient continues to have significant anhedonia, is exhibiting psychomotor slowing, decreased appetite, depressed mood and irritability, and decreased concentration. Psychomotor slowing could due to pain and weakness or an independent sign of his dementia. - Offered patient SSRI today, he is considering - Up to chair with assistance - Increasing ADL's like bathing - F/u with brother concerning placemement  Fecal incontinence Unclear of the etiology of his fecal incontinence. No sacral or lumbar lesion was found on MRI.  Patient noted to have large stool burden on CT.  - Patient will follow-up with Neurology outpatient.   Muscle spasms Patient continues to have muscles spasms in BLE. - Continue Baclofen 10 mg BID   Bilateral plantar desquamation, erythematous papular eruption of trunk and arms Improvement noted this AM, patient does continue to have some intertrigo that wraps around from the groin to his lumbar area and buttocks. - Triamcinolone/Eucerin cream prn  Infrapannicular intertrigo - Continue Nystatin powder TID (8/14 days) , Fluconazole is finshed - Needs barrier protection, keep skin dry   Bilateral Arm Rash Improved today. Skin on arm appears to not have disruption. - Triamcinolone/ Eucerin   LLE thigh wound - Continue wound care   Chronic pain syndrome - Continue home oxycodone 10 mg q6hrs PRN--encouraging him to request this when needed. Patient has been requiring 30mg  daily.    Diabetes mellitus - CBG monitoring - SSI tid with meals Patient  required on 2U SAI yesterday.    Prior to Admission Living Arrangement: SNF Anticipated Discharge Location: SNF Barriers to Discharge: Dispo Dispo: Anticipated discharge in approximately 1-2 day(s).   , MD 09/13/2020, 6:26 AM Pager: [phone] After 5pm on weekdays and 1pm on weekends: On Call pager [phone]

## 2020-09-13 NOTE — Progress Notes (Signed)
An inflammatory myositis is also on the DDx. Outpatient EMG/NCS will also be useful to assess for this possibility. CK, myoglobin, LDH and aldolase levels have been ordered, in addition to ESR, c-reactive protein and IgM and IgG levels.   Electronically signed: Dr. Kerney Elbe

## 2020-09-13 NOTE — TOC Progression Note (Signed)
Transition of Care [facility]) - Progression Note    Patient Details  Name: [name] MRN: [id number] Date of Birth: 22-Jun-1956  Transition of Care [facility]) CM/SW Contact  Doy Hutching, [place] Phone Number: 09/13/2020, 4:51 PM  Clinical Narrative:    Pt now possibly accepting of SNF referral, unfortunately [facility] does not have any male beds in the near future- this was communicated to Dr. Morrie Sheldon. TOC team has sent referral will f/u tomorrow.    Expected Discharge Plan: Home w Home Health Services Barriers to Discharge: Continued Medical Work up  Expected Discharge Plan and Services Expected Discharge Plan: Home w Home Health Services Living arrangements for the past 2 months: Mobile Home  Readmission Risk Interventions Readmission Risk Prevention Plan 08/01/2020  Post Dischage Appt Complete  Medication Screening Complete  Transportation Screening Complete  Some recent data might be hidden

## 2020-09-13 NOTE — Progress Notes (Signed)
Occupational Therapy Treatment Patient Details Name: [name] MRN: [id number] DOB: 11/17/1956 Today's Date: 09/13/2020    History of present illness Pt is a 64 y/o male admitted secondary to fall and increased weakness after being d/c'd from SNF. Pt with L hip wound as well. PMH includes DM and HTN.    OT comments  Patient willing to get to the recliner for lunch.  Able to perform grooming and a little bathing in supported sitting.  Patient did not transfer as well this date.  He was unable to achieve a full stand, and was did squat pivot to the recliner.  Patient was tearful this date regarding his home situation and ability to go back home.  He is willing to go to SNF for rehab and hopefully he can transition soon.  OT reviewed the POC to increase independence for toilet transfer and self care ability.  OT will continue to follow in the acute setting.    Follow Up Recommendations  SNF    Equipment Recommendations  3 in 1 bedside commode;Wheelchair cushion (measurements OT);Wheelchair (measurements OT);Tub/shower bench    Recommendations for Other Services      Precautions / Restrictions Precautions Precautions: Fall Precaution Comments: advised staff +2 for staff safety. Restrictions Weight Bearing Restrictions: No Other Position/Activity Restrictions: patient was unable to achieve standing this date.       Mobility Bed Mobility   Bed Mobility: Supine to Sit     Supine to sit: Min assist;HOB elevated        Transfers     Transfers: Stand Pivot Transfers   Stand pivot transfers: Max assist       General transfer comment: unable to achieve stand this date.  he would get about 3/4 stand and then sit back down.    Balance   Sitting-balance support: No upper extremity supported;Feet supported Sitting balance-Leahy Scale: Fair                                     ADL either performed or assessed with clinical judgement   ADL    Eating/Feeding: Independent;Sitting   Grooming: Wash/dry hands;Wash/dry face;Set up;Sitting       Lower Body Bathing: Moderate assistance;Sit to/from stand           Toilet Transfer: Maximal assistance;Squat-pivot                                         Pertinent Vitals/ Pain       Pain Assessment: No/denies pain                                                          Frequency  Min 2X/week        Progress Toward Goals  OT Goals(current goals can now be found in the care plan section)  Progress towards OT goals: Progressing toward goals  Acute Rehab OT Goals Patient Stated Goal: I need a wheelchair at home.  Hopefully I can go back. OT Goal Formulation: With patient Time For Goal Achievement: 09/21/20 Potential to Achieve Goals: Good  Plan Discharge plan remains appropriate    Co-evaluation  AM-PAC OT "6 Clicks" Daily Activity     Outcome Measure   Help from another person eating meals?: None Help from another person taking care of personal grooming?: A Little Help from another person toileting, which includes using toliet, bedpan, or urinal?: A Lot Help from another person bathing (including washing, rinsing, drying)?: A Lot Help from another person to put on and taking off regular upper body clothing?: A Little Help from another person to put on and taking off regular lower body clothing?: A Lot 6 Click Score: 16    End of Session Equipment Utilized During Treatment: Gait belt;Rolling walker  OT Visit Diagnosis: Unsteadiness on feet (R26.81);Repeated falls (R29.6)   Activity Tolerance     Patient Left in chair;with call bell/phone within reach;with chair alarm set   Nurse Communication Mobility status        Time: 6811-5726 OT Time Calculation (min): 21 min  Charges: OT General Charges $OT Visit: 1 Visit OT Treatments $Self Care/Home Management : 8-22  mins  09/13/2020  Rich, OTR/L  Acute Rehabilitation Services  Office:  [phone]    Suzanna Obey 09/13/2020, 12:35 PM

## 2020-09-13 NOTE — Plan of Care (Signed)
  Problem: Activity: Goal: Risk for activity intolerance will decrease Outcome: Progressing   Problem: Pain Managment: Goal: General experience of comfort will improve Outcome: Not Progressing   

## 2020-09-13 NOTE — Progress Notes (Signed)
Spoke with patient's brother. He reported that after their mother died. Patient layed in bed for "almost a year." Bed got infected with bed bugs. Patient stopped taking his DM medications. Patient "gave up." He stopped going to his pain appointments. He has always been " like a hermit" and relied heavily on his interactions and taking care of his mother. Brother now feels that attitude is starting to change back to wanting to live.  Brother would like patient to go to [facility] that does rehab.   Spoke with patient about the above information and continuing to consider starting SSRI's. He will continue to think about it and has 2 goals today: Eat lunch and wash face and arms.

## 2020-09-13 NOTE — [place] FL2 (Signed)
[place] MEDICAID FL2 LEVEL OF CARE SCREENING TOOL     IDENTIFICATION  Patient Name: [name] Birthdate: Oct 12, 1956 Sex: male Admission Date (Current Location): 09/05/2020  [facility] and [place] Number:  Producer, television/film/video and Address:  [facility] [place], [address] [place], [place] [address]      Provider Number: [id number]  Attending Physician Name and Address:  Tyson Alias, *  Relative Name and Phone Number:       Current Level of Care: Hospital Recommended Level of Care: Skilled Nursing Facility Prior Approval Number:    Date Approved/Denied:   PASRR Number: [id number]  Discharge Plan: SNF    Current Diagnoses: Patient Active Problem List   Diagnosis Date Noted  . Malnutrition of moderate degree 09/11/2020  . Loss of weight 09/07/2020  . Candidal intertrigo 09/07/2020  . Fall at home 09/06/2020  . Hypoglycemia 07/31/2020  . Purulent Cellulitis of left hip 07/15/2020  . Recurrent falls 07/15/2020  . 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 Physical deconditioning 07/15/2020  . Asymptomatic bacteriuria 07/15/2020  . Diabetes mellitus (HCC) 01/28/2012    Orientation RESPIRATION BLADDER Height & Weight     Time, Self, Situation, Place  Normal Incontinent Weight: 220 lb 0.3 oz (99.8 kg) Height:  5\' 10"  (177.8 cm)  BEHAVIORAL SYMPTOMS/MOOD NEUROLOGICAL BOWEL NUTRITION STATUS      Continent Diet (see discharge summary)  AMBULATORY STATUS COMMUNICATION OF NEEDS Skin   Extensive Assist Verbally Skin abrasions, Other (Comment) (wounds on sacrum and hip with foam dressings; excoriation on buttocks and abdomen; rash on groin; MASD pelvis)                       Personal Care Assistance Level of Assistance  Bathing, Feeding, Dressing Bathing Assistance: Maximum assistance Feeding assistance: Independent Dressing Assistance: Maximum assistance     Functional Limitations Info  Sight, Hearing, Speech Sight  Info: Impaired Hearing Info: Adequate Speech Info: Adequate    SPECIAL CARE FACTORS FREQUENCY  PT (By licensed PT), OT (By licensed OT)     PT Frequency: 5x week OT Frequency: 5x week            Contractures Contractures Info: Not present    Additional Factors Info  Code Status, Allergies, Insulin Sliding Scale Code Status Info: Full Code Allergies Info: Lipitor (Atorvastatin Calcium)   Insulin Sliding Scale Info: insulin aspart (novoLOG) injection 0-15 Units 3x daily with meals       Current Medications (09/13/2020):  This is the current hospital active medication list Current Facility-Administered Medications  Medication Dose Route Frequency Provider Last Rate Last Admin  . acetaminophen (TYLENOL) tablet 650 mg  650 mg Oral Q6H PRN 09/15/2020, MD   650 mg at 09/13/20 1646   Or  . acetaminophen (TYLENOL) suppository 650 mg  650 mg Rectal Q6H PRN Aslam, Sadia, MD      . baclofen (LIORESAL) tablet 10 mg  10 mg Oral BID 09/15/20, MD   10 mg at 09/13/20 1117  . enoxaparin (LOVENOX) injection 40 mg  40 mg Subcutaneous Q24H Aslam, Sadia, MD   40 mg at 09/12/20 1818  . HYDROmorphone (DILAUDID) injection 0.5 mg  0.5 mg Intravenous Q8H PRN 09/14/20, MD      . insulin aspart (novoLOG) injection 0-15 Units  0-15 Units Subcutaneous TID WC 02-20-1985, MD   3 Units at 09/12/20 1230  . lidocaine (LIDODERM) 5 % 1 patch  1  patch Transdermal Q24H Bobbye Morton, MD   1 patch at 09/13/20 1430  . multivitamin with minerals tablet 1 tablet  1 tablet Oral Daily Tyson Alias, MD   1 tablet at 09/13/20 1116  . nystatin (MYCOSTATIN/NYSTOP) topical powder   Topical BID Belva Agee, MD   Given at 09/13/20 1117  . ondansetron (ZOFRAN) injection 4 mg  4 mg Intravenous Q6H PRN Dellia Cloud, MD      . oxyCODONE (Oxy IR/ROXICODONE) immediate release tablet 10 mg  10 mg Oral Q6H PRN Eliezer Bottom, MD   10 mg at 09/13/20 1116  . polyethylene glycol (MIRALAX / GLYCOLAX)  packet 17 g  17 g Oral Q24H Claudean Severance, MD   17 g at 09/12/20 2130  . triamcinolone 0.1 % cream : eucerin cream, 1:1   Topical TID Dellia Cloud, MD   Given at 09/13/20 1506     Discharge Medications: Please see discharge summary for a list of discharge medications.  Relevant Imaging Results:  Relevant Lab Results:   Additional Information SSN [id number]  Doy Hutching, LCSW

## 2020-09-14 LAB — GLUCOSE, CAPILLARY
Glucose-Capillary: 125 mg/dL — ABNORMAL HIGH (ref 70–99)
Glucose-Capillary: 156 mg/dL — ABNORMAL HIGH (ref 70–99)
Glucose-Capillary: 79 mg/dL (ref 70–99)
Glucose-Capillary: 168 mg/dL — ABNORMAL HIGH (ref 70–99)

## 2020-09-14 LAB — MYOGLOBIN, SERUM: Myoglobin: 24 ng/mL — ABNORMAL LOW (ref 28–72)

## 2020-09-14 LAB — IGM: IgM (Immunoglobulin M), Srm: 90 mg/dL (ref 20–172)

## 2020-09-14 LAB — IGG: IgG (Immunoglobin G), Serum: 1230 mg/dL (ref 603–1613)

## 2020-09-14 NOTE — TOC Transition Note (Signed)
Transition of Care [facility]) - CM/SW Discharge Note   Patient Details  Name: MAUREEN DUESING MRN: [id number] Date of Birth: 04-08-1956  [facility]) CM/SW Contact:  Doy Hutching, LCSW Phone Number: 09/14/2020, 1:23 PM   Clinical Narrative:    CSW spoke with pt at bedside. Provided him with offers that we have for SNF. Pt does not really engage in any discussion around SNF. He requests we speak with his brother about the options. CSW will send referrals again in case additional offers arise. Pt aware that [facility] is not an option at this time.     Barriers to Discharge: Continued Medical Work up, English as a second language teacher   Patient Goals and CMS Choice CMS Medicare.gov Compare Post Acute Care list provided to:: Patient Choice offered to / list presented to : Patient   Discharge Plan and Services Post Acute Care Choice: Skilled Nursing Facility           Readmission Risk Interventions Readmission Risk Prevention Plan 08/01/2020  Post Dischage Appt Complete  Medication Screening Complete  Transportation Screening Complete  Some recent data might be hidden

## 2020-09-14 NOTE — Progress Notes (Addendum)
Subjective: Patient reports that he is doing "ok" this AM. He had no overnight events and does not feel that he is having any side effects other than a "dry mouth" from the SSRI. Patient reports that he has not had anything to drink this AM despite having a dry mouth. He did report being hungry and wanted the team to bring his tray closer so her could eat and drink.   Objective:  Vital signs in last 24 hours: Vitals:   09/13/20 0600 09/13/20 1320 09/13/20 2038 09/14/20 0436  BP: 116/78 (!) 105/52 108/61 110/63  Pulse: 87 86 85 86  Resp: 18 20 20 18   Temp: 98.2 F (36.8 C) 98.6 F (37 C) 98.4 F (36.9 C) 98.1 F (36.7 C)  TempSrc: Oral Oral Oral Oral  SpO2: 100% 96% 97% 98%  Weight:      Height:       Physical Exam Constitutional:      General: He is not in acute distress. Eyes:     Extraocular Movements: Extraocular movements intact.  Cardiovascular:     Rate and Rhythm: Normal rate and regular rhythm.  Pulmonary:     Effort: Pulmonary effort is normal.     Breath sounds: Normal breath sounds.  Abdominal:     General: Bowel sounds are normal.     Palpations: Abdomen is soft.     Tenderness: There is no abdominal tenderness.  Neurological:     Mental Status: He is alert.  Psychiatric:        Attention and Perception: Attention normal.        Behavior: Behavior is cooperative.        Cognition and Memory: Cognition is impaired. He exhibits impaired recent memory.     Comments: High pitched tone in speech. Slightly irritable this AM but overall amicable and willing to participate      Assessment/Plan:  Principal Problem:   Fall at home Active Problems:   Diabetes mellitus (HCC)   Frailty syndrome in geriatric patient   Physical deconditioning   Loss of weight   Candidal intertrigo   Malnutrition of moderate degree  Unintentional Weight loss (50 lb) Depression Decline in functional status possibly due to depression. Per patient's brother he has had a  significant change in mood and behavior since the passing of their mother. Patient started on 5mg  Lexapro yesterday at his request. Patient appeared a bit more alert this AM. His recent memory is still a bit impaired, but he was able to say he completed the goals from the day prior after being reminded what they were. Patient appeared to have less psychomotor slowing and spoke more than usual during exam. Patient had appetite this AM. - Continue Lexapro 5mg  daily - Goal from team: Up to chair with assistance >3h - Personal goal: Increasing ADL's like bathing down to the abdomen - Personal goal: Eating meals - Ok for discharge to SNF when bed available  Fall Lower Extremity Weakness  Patient reports feeling ok this AM. Patient was able to sit up in the chair for 3 hours although he was a bit irritated because he wanted to go back to the bed. Discussed with patient that it is good to increase time in the chair daily. Patient myoglobin resulted 24. OT note positive progress yesterday in patient although patient is still upset about having to go to a SNF rather than back to his home. - F/u Thiamine - F/u Copper - Follow-up Vit E - F/u  IgG and IgM - PT/OT - EMG and NCS as an outpatient with neurology clinic   Muscle spasms Patient continues to have muscles spasms in BLE. Did not report leg spasms during interview this AM. - Continue Baclofen 10 mg BID   Bilateral plantar desquamation, erythematous papular eruption of trunk and arms Improvement noted this AM, patient does continue to have some intertrigo that wraps around from the groin to his lumbar area and buttocks. - Triamcinolone/Eucerin cream prn   Infrapannicular intertrigo - Continue Nystatin powder TID (9/14 days) , Fluconazole is finshed - Needs barrier protection, keep skin dry - Personal goal: Patient is attempting to increase personal hygiene   Bilateral Arm Rash Improved today. Skin on arm appears to not have disruption. -  Triamcinolone/ Eucerin   LLE thigh wound - Continue wound care   Chronic pain syndrome - Continue home oxycodone 10 mg q6hrs PRN--encouraging him to request this when needed. Patient has been requiring 30mg  daily.    Diabetes mellitus - CBG monitoring - SSI tid with meals Patient required on 2U SAI yesterday.    Prior to Admission Living Arrangement: SNF Anticipated Discharge Location: SNF Barriers to Discharge: Dispo Dispo: Anticipated discharge in approximately 1-2 day(s).   , MD 09/14/2020, 12:11 PM Pager:[phone] After 5pm on weekdays and 1pm on weekends: On Call pager [phone]

## 2020-09-15 LAB — GLUCOSE, CAPILLARY
Glucose-Capillary: 86 mg/dL (ref 70–99)
Glucose-Capillary: 174 mg/dL — ABNORMAL HIGH (ref 70–99)
Glucose-Capillary: 109 mg/dL — ABNORMAL HIGH (ref 70–99)
Glucose-Capillary: 115 mg/dL — ABNORMAL HIGH (ref 70–99)

## 2020-09-15 MED ORDER — TIZANIDINE HCL 2 MG PO TABS
2.0000 mg | ORAL_TABLET | Freq: Four times a day (QID) | ORAL | Status: DC | PRN
  Administered 2020-09-15 – 2020-09-17 (×9): 2 mg via ORAL
  Filled 2020-09-15 (×9): qty 1

## 2020-09-15 NOTE — Progress Notes (Signed)
Subjective:  Patient resting comfortably in bed. He states the his left 1st toe hurts and he continues to have muscle cramps. He is open to SNF at this time.  Objective:  Vital signs in last 24 hours: Vitals:   09/14/20 1418 09/14/20 1933 09/15/20 0357 09/15/20 1440  BP: (!) 117/53 115/61 110/65 (!) 122/57  Pulse: 72 85 86 78  Resp:  18 18 17   Temp: 98.2 F (36.8 C) 98.1 F (36.7 C) 98 F (36.7 C) 98.1 F (36.7 C)  TempSrc: Oral Oral Oral Oral  SpO2: 98% 98% 98% 99%  Weight:      Height:       Physical Exam Constitutional:      General: He is not in acute distress. Eyes:     Extraocular Movements: Extraocular movements intact.  Cardiovascular:     Rate and Rhythm: Normal rate and regular rhythm.  Pulmonary:     Effort: Pulmonary effort is normal.     Breath sounds: Normal breath sounds.  Abdominal:     General: Bowel sounds are normal.     Palpations: Abdomen is soft.     Tenderness: There is no abdominal tenderness.  Musculoskeletal:        General: Tenderness (left 1st toe without erythe or effusion.) present.  Neurological:     Mental Status: He is alert.     Motor: Weakness (lwoer extremities) present.  Psychiatric:        Attention and Perception: Attention normal.        Behavior: Behavior is cooperative.     Assessment/Plan:  Principal Problem:   Fall at home Active Problems:   Diabetes mellitus (HCC)   Frailty syndrome in geriatric patient   Physical deconditioning   Loss of weight   Candidal intertrigo   Malnutrition of moderate degree  Unintentional Weight loss (50 lb) Depression Current work up for weight loss has been unremarkable. Concern for possible depression as patient continues to have fluctuations in eating , sleeping and energy levels.  - Continue Lexapro 5mg  daily - Ok for discharge to SNF when bed available  Fall Lower Extremity Weakness  Patient will be discharge to SNF for subacute PT. He will need outpatient follow up  with neurology for possible EMG. Current work up for LE weakness and paresthesias has been unremarkable. - F/u Thiamine - F/u Copper - Follow-up Vit E - F/u IgG and IgM - PT/OT - EMG and NCS as an outpatient with neurology clinic   Muscle spasms Patient continues to have muscles spasms in BLE. Did not report leg spasms during interview this AM. - Will start low dose tizanidine 2 mg q 6 hours prn - D.c baclofen   Bilateral plantar desquamation, erythematous papular eruption of trunk and arms Improvement noted this AM, patient does continue to have some intertrigo that wraps around from the groin to his lumbar area and buttocks. - Triamcinolone/Eucerin cream prn   Infrapannicular intertrigo - Continue Nystatin powder TID (9/14 days) , Fluconazole is finshed - Needs barrier protection, keep skin dry - Personal goal: Patient is attempting to increase personal hygiene   Bilateral Arm Rash Improved today. Skin on arm appears to not have disruption. - Triamcinolone/ Eucerin   LLE thigh wound - Continue wound care   Chronic pain syndrome - Continue home oxycodone 10 mg q6hrs PRN--encouraging him to request this when needed. Patient has been requiring 30mg  daily.    Diabetes mellitus - CBG monitoring - SSI tid with meals  Prior to Admission Living Arrangement: SNF Anticipated Discharge Location: SNF Barriers to Discharge: Dispo Dispo: Anticipated discharge in approximately 1-2 day(s).   Dellia Cloud, MD 09/15/2020, 6:10 PM Pager:[phone] After 5pm on weekdays and 1pm on weekends: On Call pager [phone]

## 2020-09-15 NOTE — TOC Progression Note (Signed)
Transition of Care [facility]) - Progression Note    Patient Details  Name: [name] MRN: [id number] Date of Birth: October 24, 1956  Transition of Care [facility]) CM/SW Contact  Jimmy Picket, [place] Phone Number: 09/15/2020, 2:53 PM  Clinical Narrative:     CSW spoke with pt at bedside and brother [name] on speaker phone. CSW verbally gave pt bed offers and medicare.gov rating scale. Pt and [name] chose [facility]. CSW updated Navi on facility.  Pt [facility] is [id number]. Pt is approved 9/25- 9/28.  CSW reached out to [facility] to verify when they will have a bed.    Expected Discharge Plan: Skilled Nursing Facility Barriers to Discharge: Continued Medical Work up, English as a second language teacher  Expected Discharge Plan and Services Expected Discharge Plan: Skilled Nursing Facility     Post Acute Care Choice: Skilled Nursing Facility Living arrangements for the past 2 months: Mobile Home                                       Social Determinants of Health (SDOH) Interventions    Readmission Risk Interventions Readmission Risk Prevention Plan 08/01/2020  Post Dischage Appt Complete  Medication Screening Complete  Transportation Screening Complete  Some recent data might be hidden    Jimmy Picket, Theresia Majors, [facility] Clinical Social Worker [phone]

## 2020-09-15 NOTE — Progress Notes (Signed)
Patient complaining of muscle cramps and requesting a muscle relaxer. MD paged.

## 2020-09-16 DIAGNOSIS — M6281 Muscle weakness (generalized): Secondary | ICD-10-CM

## 2020-09-16 DIAGNOSIS — G894 Chronic pain syndrome: Secondary | ICD-10-CM

## 2020-09-16 DIAGNOSIS — E440 Moderate protein-calorie malnutrition: Secondary | ICD-10-CM

## 2020-09-16 LAB — GLUCOSE, CAPILLARY
Glucose-Capillary: 85 mg/dL (ref 70–99)
Glucose-Capillary: 112 mg/dL — ABNORMAL HIGH (ref 70–99)
Glucose-Capillary: 90 mg/dL (ref 70–99)
Glucose-Capillary: 111 mg/dL — ABNORMAL HIGH (ref 70–99)

## 2020-09-16 LAB — VITAMIN B1: Vitamin B1 (Thiamine): 109.7 nmol/L (ref 66.5–200.0)

## 2020-09-16 NOTE — Progress Notes (Signed)
HD#10 Subjective:  Overnight Events: no events overnight.   Patient sitting in bed eating his breakfast. He denies new symptoms today.  Objective:  Vital signs in last 24 hours: Vitals:   09/15/20 0357 09/15/20 1440 09/15/20 2045 09/16/20 0532  BP: 110/65 (!) 122/57 (!) 151/64 (!) 136/95  Pulse: 86 78 75 80  Resp: 18 17 16 16   Temp: 98 F (36.7 C) 98.1 F (36.7 C) 97.9 F (36.6 C) 97.9 F (36.6 C)  TempSrc: Oral Oral Oral Oral  SpO2: 98% 99% 98% 97%  Weight:      Height:       Supplemental O2: Room Air SpO2: 97 %   Physical Exam:  Physical Exam Constitutional:      Appearance: Normal appearance.  HENT:     Head: Normocephalic and atraumatic.  Cardiovascular:     Rate and Rhythm: Normal rate.     Pulses: Normal pulses.  Pulmonary:     Effort: Pulmonary effort is normal. No respiratory distress.  Abdominal:     General: Abdomen is flat. Bowel sounds are normal. There is no distension.     Palpations: Abdomen is soft.  Musculoskeletal:        General: No swelling.  Skin:    General: Skin is warm and dry.     Findings: Rash present.  Neurological:     General: No focal deficit present.     Mental Status: He is alert.     Motor: Weakness (LE weakness\) present.     Filed Weights   09/05/20 2132  Weight: 99.8 kg     Intake/Output Summary (Last 24 hours) at 09/16/2020 1152 Last data filed at 09/16/2020 1029 Gross per 24 hour  Intake 120 ml  Output 900 ml  Net -780 ml   Net IO Since Admission: -2,825 mL [09/16/20 1152]  Pertinent Labs: CBC Latest Ref Rng & Units 09/12/2020 09/10/2020 09/09/2020  WBC 4.0 - 10.5 K/uL 7.8 8.9 7.9  Hemoglobin 13.0 - 17.0 g/dL 10.6(L) 10.8(L) 10.9(L)  Hematocrit 39 - 52 % 32.9(L) 33.5(L) 33.7(L)  Platelets 150 - 400 K/uL 307 295 269    CMP Latest Ref Rng & Units 09/12/2020 09/10/2020 09/09/2020  Glucose 70 - 99 mg/dL 09/11/2020) 503(T) 98  BUN 8 - 23 mg/dL 16 18 18   Creatinine 0.61 - 1.24 mg/dL 465(K 8.12  Sodium 135 -  145 mmol/L 139 141 139  Potassium 3.5 - 5.1 mmol/L 4.4 3.9 3.9  Chloride 98 - 111 mmol/L 111 111 112(H)  CO2 22 - 32 mmol/L 24 21(L) 22  Calcium 8.9 - 10.3 mg/dL 7.51) 7.00) 1.7(C)  Total Protein 6.5 - 8.1 g/dL - 5.6(L) 5.4(L)  Total Bilirubin 0.3 - 1.2 mg/dL - 0.6 0.4  Alkaline Phos 38 - 126 U/L - 76 75  AST 15 - 41 U/L - 13(L) 14(L)  ALT 0 - 44 U/L - 14 14    Imaging: No results found.  Assessment/Plan:   Principal Problem:   Fall at home Active Problems:   Diabetes mellitus (HCC)   Frailty syndrome in geriatric patient   Physical deconditioning   Loss of weight   Candidal intertrigo   Malnutrition of moderate degree   UnintentionalWeight loss (50 lb) Depression -Continue Lexapro 5mg  daily - Ok for discharge to SNF when bed available  Fall Lower Extremity Weakness Patient will be discharge to SNF for subacute PT. He will need outpatient follow up with neurology for possible EMG. Current work up for LE weakness  and paresthesias has been unremarkable. - PT/OT - EMG and NCS as an outpatient with neurology clinic  Muscle spasms Patient continues to have muscles spasms inBLE. Did not report leg spasms during interview this AM. - Continue tizanidine 2 mg q 6 hours prn  Bilateral plantardesquamation, erythematous papular eruption of trunk and arms -Triamcinolone/Eucerin creamprn  Infrapannicularintertrigo - Continue Nystatin powder TID(11/14days) - Needs barrier protection, keep skin dry  LLE thigh wound - Continue wound care  Chronic pain syndrome - Continue homeoxycodone10 mg q6hrsPRN--encouraging him to request this when needed.  Diabetes mellitus - CBG monitoring - SSI tid with meals   Diet: Carb-Modified IVF: None,None VTE: Enoxaparin Code: Full PT/OT recs: SNF for Subacute PT, none. TOC recs: SNF   Dispo: Anticipated discharge to Skilled nursing facility in 1 days pending SNF placement.    Please contact the on call pager  after 5 pm and on weekends at [phone].

## 2020-09-17 DIAGNOSIS — F329 Major depressive disorder, single episode, unspecified: Secondary | ICD-10-CM

## 2020-09-17 LAB — GLUCOSE, CAPILLARY
Glucose-Capillary: 75 mg/dL (ref 70–99)
Glucose-Capillary: 101 mg/dL — ABNORMAL HIGH (ref 70–99)
Glucose-Capillary: 127 mg/dL — ABNORMAL HIGH (ref 70–99)
Glucose-Capillary: 80 mg/dL (ref 70–99)

## 2020-09-17 LAB — SARS CORONAVIRUS 2 (TAT 6-24 HRS): SARS Coronavirus 2: NEGATIVE

## 2020-09-17 MED ORDER — NYSTATIN 100000 UNIT/GM EX POWD: Freq: Two times a day (BID) | CUTANEOUS | 0 refills | Status: AC

## 2020-09-17 MED ORDER — TIZANIDINE HCL 2 MG PO TABS: 2.0000 mg | ORAL_TABLET | Freq: Four times a day (QID) | ORAL | 0 refills | Status: AC | PRN

## 2020-09-17 MED ORDER — ESCITALOPRAM OXALATE 10 MG PO TABS
10.0000 mg | ORAL_TABLET | Freq: Every day | ORAL | 0 refills | Status: DC
Start: 2020-09-18 — End: 2023-04-01

## 2020-09-17 MED ORDER — ESCITALOPRAM OXALATE 10 MG PO TABS
5.0000 mg | ORAL_TABLET | Freq: Once | ORAL | Status: AC
  Administered 2020-09-17: 5 mg via ORAL
  Filled 2020-09-17: qty 1

## 2020-09-17 MED ORDER — TRIAMCINOLONE 0.1 % CREAM:EUCERIN CREAM 1:1: 1.0000 "application " | TOPICAL_CREAM | Freq: Three times a day (TID) | CUTANEOUS | 0 refills | Status: DC

## 2020-09-17 MED ORDER — ADULT MULTIVITAMIN W/MINERALS CH: 1.0000 | ORAL_TABLET | Freq: Every day | ORAL | 0 refills | Status: DC

## 2020-09-17 MED ORDER — OXYCODONE HCL 5 MG PO TABS: 5.0000 mg | ORAL_TABLET | Freq: Three times a day (TID) | ORAL | 0 refills | Status: AC | PRN

## 2020-09-17 MED ORDER — ESCITALOPRAM OXALATE 10 MG PO TABS: 10.0000 mg | ORAL_TABLET | Freq: Every day | ORAL | Status: DC

## 2020-09-17 NOTE — Social Work (Signed)
Clinical Social Worker facilitated patient discharge including contacting patient family and facility to confirm patient discharge plans.  Clinical information faxed to facility and family agreeable with plan.  CSW arranged ambulance transport via [facility] to [facility] RN to call [phone]  with report prior to discharge.  Clinical Social Worker will sign off for now as social work intervention is no longer needed. Please consult us again if new need arises.  Mailey Landstrom, MSW, LCSW Clinical Social Worker    

## 2020-09-17 NOTE — TOC Transition Note (Signed)
Transition of Care [facility]) - CM/SW Discharge Note   Patient Details  Name: [name] MRN: [id number] Date of Birth: 01/31/1956  [facility]) CM/SW Contact:  Doy Hutching, LCSW Phone Number: 09/17/2020, 3:09 PM   Clinical Narrative:    CSW spoke with [name], they do have a bed for pt today. COVID has finally resulted negative- copy of note from when pt received COVID vaccine in August sent to facility fax at [phone]. Pt cleared for d/c, call placed regarding dc to pt brother [name] at [phone]. Requested printed and signed controlled scripts. RN Danford Bad to call report to [facility].    Final next level of care: Skilled Nursing Facility Barriers to Discharge: Barriers Resolved   Patient Goals and CMS Choice Patient states their goals for this hospitalization and ongoing recovery are:: get stronger and be able to walk again CMS Medicare.gov Compare Post Acute Care list provided to:: Patient Choice offered to / list presented to : Patient  Discharge Placement   Existing PASRR number confirmed : 09/14/20          Patient [facility] bed at: [facility] Patient to be transferred to facility by: [facility] Name of family member notified: pt brother [name] Patient and family notified of of transfer: 09/17/20  Discharge Plan and Services Post Acute Care Choice: Skilled Nursing Facility          Readmission Risk Interventions Readmission Risk Prevention Plan 09/17/2020 08/01/2020  Post Dischage Appt Not Complete Complete  Appt Comments SNF placement -  Medication Screening Complete Complete  Transportation Screening Complete Complete  Some recent data might be hidden

## 2020-09-17 NOTE — Progress Notes (Addendum)
Physical Therapy Treatment Patient Details Name: [name] MRN: [id number] DOB: 05-31-1956 Today's Date: 09/17/2020    History of Present Illness Pt is a 64 y/o male admitted secondary to fall and increased weakness after being d/c'd from SNF. Pt with L hip wound as well. PMH includes DM and HTN.     PT Comments    Pt supine in bed on arrival.  Pt reports he has abdominal discomfort.  Pt required min assistance to rise into standing from elevated bed.  Noted to have bowel incontinence upon standing.  Transferred to toilet to complete during 1st attempt to move from bed to [facility] with RW patient present with B buckling and required total assistance +2 to move back to edge of bed.  Continue to recommend SNF placement at d/c.  Plan for use of sara stedy and issued HEP for quad strengthening next visit.      Follow Up Recommendations  SNF;Supervision/Assistance - 24 hour     Equipment Recommendations  None recommended by PT    Recommendations for Other Services       Precautions / Restrictions Precautions Precautions: Fall Precaution Comments: advised staff +2 for staff safety. Restrictions Weight Bearing Restrictions: No Other Position/Activity Restrictions: patient was unable to achieve standing this date.    Mobility  Bed Mobility Overal bed mobility: Needs Assistance Bed Mobility: Supine to Sit;Sit to Supine     Supine to sit: Min assist;HOB elevated Sit to supine: Min assist;+2 for physical assistance   General bed mobility comments: Cues for sequencing and hand placement able to rise into sitting with min assistance for trunk.  To move back to bed required assistance to lift B LEs back to bed.  Transfers Overall transfer level: Needs assistance Equipment used: Rolling walker (2 wheeled);Ambulation equipment used (used sara stedy after B buckling noted in standing.) Transfers: Stand Pivot Transfers Sit to Stand: Min assist;Mod assist;+2 physical assistance;Total  assist (from elevated surface min +1, from stand seat height mod +2.  In standing knee buckled strongly and required total +2 to prevent fall.)         General transfer comment: Assist levels varried based on height of seated surface and whether his knees buckled or not.  When buckling her required total +2 to prevent fall.  Opted for use of stedy to move from commode to standing for clean up and to move back to bed.  Pt more fatigued as tx progressed.  Ambulation/Gait Ambulation/Gait assistance:  (unable to move from surface to surface due to B buckling in standing.)               Stairs             Wheelchair Mobility    Modified Rankin (Stroke Patients Only)       Balance Overall balance assessment: Needs assistance;Modified Independent Sitting-balance support: No upper extremity supported;Feet supported Sitting balance-Leahy Scale: Fair Sitting balance - Comments: able to sit without assistance. Single hand support on bed rail   Standing balance support: Bilateral upper extremity supported;During functional activity Standing balance-Leahy Scale: Poor Standing balance comment: Reliant on BUE support and external support. B buckling noted in standing.                            Cognition Arousal/Alertness: Awake/alert Behavior During Therapy: WFL for tasks assessed/performed Overall Cognitive Status: No family/caregiver present to determine baseline cognitive functioning  Exercises      General Comments        Pertinent Vitals/Pain Pain Assessment: No/denies pain Faces Pain Scale: Hurts even more Pain Location: stomach discomfort. Pain Descriptors / Indicators: Pressure Pain Intervention(s): Monitored during session;Repositioned (transferred patient to toilet to complete BM.)    Home Living                      Prior Function            PT Goals (current goals can now be  found in the care plan section) Acute Rehab PT Goals Patient Stated Goal: I want to go back to bed. Potential to Achieve Goals: Fair Progress towards PT goals: Progressing toward goals    Frequency    Min 2X/week      PT Plan Current plan remains appropriate    Co-evaluation              AM-PAC PT "6 Clicks" Mobility   Outcome Measure  Help needed turning from your back to your side while in a flat bed without using bedrails?: A Little Help needed moving from lying on your back to sitting on the side of a flat bed without using bedrails?: A Little Help needed moving to and from a bed to a chair (including a wheelchair)?: Total Help needed standing up from a chair using your arms (e.g., wheelchair or bedside chair)?: Total Help needed to walk in hospital room?: Total Help needed climbing 3-5 steps with a railing? : Total 6 Click Score: 10    End of Session Equipment Utilized During Treatment: Gait belt Activity Tolerance: Patient tolerated treatment well Patient left: in bed;with call bell/phone within reach;with nursing/sitter in room;with bed alarm set Nurse Communication: Mobility status PT Visit Diagnosis: Unsteadiness on feet (R26.81);Muscle weakness (generalized) (M62.81);History of falling (Z91.81);Repeated falls (R29.6);Difficulty in walking, not elsewhere classified (R26.2)     Time: 8416-6063 PT Time Calculation (min) (ACUTE ONLY): 24 min  Charges:  $Therapeutic Activity: 23-37 mins                     Bonney Leitz , PTA Acute Rehabilitation Services Pager [phone] Office [phone]     Verlia Kaney ELYAS VILLAMOR 09/17/2020, 1:03 PM

## 2020-09-17 NOTE — Progress Notes (Signed)
Increased patient Lexapro to 10mg  today, 09/17/2020.

## 2020-09-17 NOTE — Progress Notes (Signed)
   Subjective: No acute overnight events. Patient reports doing "okay" this AM.  Objective:  Vital signs in last 24 hours: Vitals:   09/16/20 0532 09/16/20 1404 09/16/20 2047 09/17/20 0422  BP: (!) 136/95 139/62 (!) 135/55 (!) 142/73  Pulse: 80 65 67 75  Resp: 16 16 17 17   Temp: 97.9 F (36.6 C) 98.3 F (36.8 C) 98.4 F (36.9 C) 98 F (36.7 C)  TempSrc: Oral Oral Oral Oral  SpO2: 97% 98% 96% 97%  Weight:    89.8 kg  Height:       Physical Exam Constitutional:      Appearance: He is not ill-appearing.  Eyes:     Extraocular Movements: Extraocular movements intact.  Cardiovascular:     Rate and Rhythm: Normal rate and regular rhythm.  Pulmonary:     Effort: Pulmonary effort is normal.     Breath sounds: Normal breath sounds.  Abdominal:     General: Bowel sounds are normal.     Palpations: Abdomen is soft.     Tenderness: There is no abdominal tenderness.  Neurological:     Mental Status: He is alert.  Psychiatric:        Attention and Perception: Attention normal.        Mood and Affect: Affect is labile.        Behavior: Behavior is cooperative.        Judgment: Judgment normal.     Comments: Attention has improved.  Continues to speak with high tone in speech but at a normal rate. Appears less irritated. Patient recognizes he needs to continue therapy and seems to accept he will be going to SNF.     Assessment/Plan:  Principal Problem:   Fall at home Active Problems:   Diabetes mellitus (HCC)   Frailty syndrome in geriatric patient   Physical deconditioning   Loss of weight   Candidal intertrigo   Malnutrition of moderate degree  UnintentionalWeight loss (50 lb) Depression Patient reported mood as "okay." Appetite continues to improve. Patient reports some minor improvement is sleep. Patient medically stable for discharge to SNF. -Continue Lexapro 5mg  daily - Ok for discharge to SNF when bed available - Daily goals: Bathing and eating  meals  Fall Lower Extremity Weakness Patient will be discharge to SNF for subacute PT. He will need outpatient follow up with neurology for possible EMG. Current work up for LE weakness and paresthesias has been unremarkable. - PT/OT - EMG and NCS as an outpatient with neurology clinic  Muscle spasms Patient continues to have muscles spasms inBLE. Did not report leg spasms during interview this AM. - Continue tizanidine 2 mg q 6 hours prn  Bilateral plantardesquamation, erythematous papular eruption of trunk and arms -Triamcinolone/Eucerin creamprn  Infrapannicularintertrigo - Continue Nystatin powder TID(12/14days) - Needs barrier protection, keep skin dry  LLE thigh wound Wound appears to be healing well and has granulation tissue.  - Continue wound care  Chronic pain syndrome - Continue homeoxycodone10 mg q6hrsPRN--encouraging him to request this when needed.  Diabetes mellitus - CBG monitoring - SSI tid with meals  Prior to Admission Living Arrangement: SNF Anticipated Discharge Location: SNF Barriers to Discharge: Dispo Dispo: Anticipated discharge in approximately 1 day.   , MD 09/17/2020, 9:59 AM Pager: [phone] After 5pm on weekdays and 1pm on weekends: On Call pager [phone]

## 2020-09-17 NOTE — Progress Notes (Signed)
2323 Pt picked up by [facility]. D/c'd to [facility]. Discharged packet given.

## 2020-09-17 NOTE — Discharge Summary (Addendum)
Name: [name] MRN: [id number] DOB: 1956-02-23 64 y.o. PCP: Patient, No Pcp Per  Date of Admission: 09/05/2020  2:03 PM Date of Discharge: 09/17/2020 Attending Physician: [name], [name], *  Discharge Diagnosis: Principal Problem:   Fall at home Active Problems:   Diabetes mellitus (HCC)   Frailty syndrome in geriatric patient   Physical deconditioning   Loss of weight   Candidal intertrigo   Malnutrition of moderate degree   Discharge Medications: Allergies as of 09/17/2020       Reactions   Lipitor [atorvastatin Calcium] Other (See Comments)   Muscle pain        Medication List     STOP taking these medications    acetaminophen 500 MG tablet Commonly known as: TYLENOL   baclofen 10 MG tablet Commonly known as: LIORESAL   gabapentin 400 MG capsule Commonly known as: NEURONTIN   gabapentin 600 MG tablet Commonly known as: NEURONTIN   ibuprofen 400 MG tablet Commonly known as: ADVIL       TAKE these medications    ENSURE PO Take 1 Bottle by mouth daily with breakfast.   escitalopram 10 MG tablet Commonly known as: LEXAPRO Take 1 tablet (10 mg total) by mouth daily. Start taking on: September 18, 2020   multivitamin with minerals Tabs tablet Take 1 tablet by mouth daily. Start taking on: September 18, 2020   nystatin powder Commonly known as: MYCOSTATIN/NYSTOP Apply topically 2 (two) times daily for 3 days.   oxyCODONE 5 MG immediate release tablet Commonly known as: Roxicodone Take 1 tablet (5 mg total) by mouth every 8 (eight) hours as needed for up to 3 days. What changed:  how much to take when to take this reasons to take this Another medication with the same name was removed. Continue taking this medication, and follow the directions you see here.   tiZANidine 2 MG tablet Commonly known as: ZANAFLEX Take 1 tablet (2 mg total) by mouth every 6 (six) hours as needed for up to 3 days for muscle spasms.   triamcinolone  0.1 % cream : eucerin Crea Apply 1 application topically 3 (three) times daily.               Discharge Care Instructions  (From admission, onward)           Start     Ordered   09/17/20 0000  Discharge wound care:       Comments: Wound care  Daily      Comments: Measure and cut length of InterDry to fit in abdominal pannus. Place into pannus after washing with soap and water and patting dry. Tuck InterDry fabric into skin folds in a single layer, allow for 2 inches of overhang from skin edges to allow for wicking to occur May remove to bathe; dry area thoroughly and then tuck into affected areas again  Do not apply any creams or ointments when using InterDry DO NOT THROW AWAY FOR 5 DAYS unless soiled with stool DO NOT [facility] product, this will inactivate the silver in the material  New sheet of Interdry should be applied after 5 days of use if patient continues to have skin breakdown   09/17/20 1508            Disposition and follow-up:   [name] [facility].  At the hospital follow up visit please address:  1. - Please continue to assess patient for depression  and response with Lexapro 10mg , may consider referral to behavioral health - Please reassess patient chronic pain regimen - Neruology Outpatient will continue workup   2.  Labs / imaging needed at time of follow-up: EMG/NCS w/ neurology  3.  Pending labs/ test needing follow-up: Vit E, Copper  Follow-up Appointments:  Contact information for after-discharge care     Destination     [facility] .   Service: Skilled Nursing Contact information: [address] [place] [phone]                     Hospital Course by problem list: [name] is a 64 year old male with PMHx of hypertension, diabetes mellitus, hyperlipidemia, recent left upper thigh abscess s/p I&D, chronic pain  syndrome presenting after a mechanical fall without injury secondary to a one month history of bilateral lower extremity weakness with fecal incontinence.   Fall due to weakness Patient presented after a witnessed fall from standing height and landed on knees. He denied any injury. Felt dizzy prior to falling but attributed this to tobacco use as he smoked a cigarette. Denied head trauma or LOC. Imaging negative for any acute fractures. Fall is likely mechanical given his lower extremity weakness. PT recommended SNF with 24-hour assistance given significant weakness.   Fecal incontinence Rectal exam reveals decreased sphincter tone and MRI findings are suggestive of progressive spondylosis and possible compression of S1 nerve root due to narrowing of L5-S1 subarticular recess. KUB ant CTA chest, abdomen, pelvis shows moderate stool burden suggesting constipation without signs of SBO or free air. We switched Miralax PRN to daily given constipation, which may have been contributing to fecal incontinence.  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 None of the imaging was significant to explain significant weight loss or significant weakness.     Bilateral plantar desquamation, erythematous papular eruption of trunk and arms Patient endorsed that this rash had been present over the past month but had not been evaluated by podiatry or dermatology. It appeared consistent with tinea pedis in addition to some other aggravating pathology. Itching truncal rash noted on core at admission, concerning for eczema vs allergic dermatitis. He was given triamcinolone/Eucerin cream for itching, diffuse eczematous  rash.    Infrapannicular intertrigo Impressively red rash consistent with intertrigo, with potential overlying cellulitis. This was treated with Nystatin powder TID, hygiene, and 7 day course of Fluconazole.     LLE thigh wound Well-healing left thigh wound and surgical scar. No active drainage, erythema or tenderness surrounding the area. No signs of cellulitis in the wound at admission. Wound care consulted, recommendations provided for dressings. He was monitored for any signs of infection throughout his stay. Area progressed to well healed by discharge.   Chronic pain syndrome Patient has history of chronic pain and spasms of the BLE for which he is on baclofen and oxycodone IR at home. Initially patient was continued on baclofen but was eventually transitioned to tizanidine. Patient was continued on home oxy but was placed PRN inpatient.   Diabetes mellitus Most recent HbA1c 6.9. Patient is not on any diabetic medication at home. Patient was placed on SSI and had CBG monitoring ac and qhs.  Depressed mood Spoke with patient about depression as he showed signs and family also noted patient had had significant change and mood after death of mother.Started patient on 5mg  Lexapro daily and implemented daily goals with patient. Patient saw some improvement, increased to 10mg .      Discharge Vitals:   BP (!) 147/65 (BP Location: Right Arm)   Pulse 60   Temp 98 F (36.7 C) (Oral)   Resp 18   Ht 5\' 10"  (1.778 m)   Wt 89.8 kg   SpO2 99%   BMI 28.41 kg/m   Pertinent Labs, Studies, and Procedures:  09/06/2020- XR knee R IMPRESSION: 1. Three compartmental osteoarthritis.  No acute bony abnormality. 09/06/2020- XR Knee L IMPRESSION: 1. Three compartmental osteoarthritis with likely reactive trace joint effusion. 2. No acute bony abnormality. 09/06/2020- XR Hip Unilateral R IMPRESSION: 1. Right hip osteoarthritis.  No acute fracture. 09/06/2020- XR Hip L IMPRESSION: 1. Stable left  hip osteoarthritis.  No acute fracture. 09/06/2020- KUB IMPRESSION: 1. Moderate stool throughout the colon suggesting constipation. 2. No findings for small bowel obstruction or free air. 09/10/2020-MRI Brain IMPRESSION: No evidence of acute intracranial abnormality.   Mild multifocal T2 hyperintense signal changes within the cerebral white matter are nonspecific, but most commonly seen on the basis of chronic small vessel ischemia.   Paranasal sinus disease as described. Correlate for acute sinusitis.   Small bilateral mastoid effusions. 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 Multilevel spondylosis and sequela of C5-6 ACDF.   Moderate bilateral C3-4 and moderate to severe bilateral C4-5 neural foraminal narrowing.   Mild spinal canal narrowing at the C3-5 and C6-7 levels. 09/10/2020- MRI thoracic MRI THORACIC SPINE:   Multilevel spondylosis most prominent in the midthoracic spine without significant spinal canal or neural foraminal narrowing.   Small right paracentral T6-7 protrusion abutting the ventral cord.   Motion degraded exam.  09/11/2020- CT Chest, Abdomen, Pelvis IMPRESSION: 1. A specific cause for the patient's weight loss is not identified. 2. Other imaging findings of potential clinical significance: Coronary atherosclerosis. Airway thickening is present, suggesting bronchitis or reactive airways disease. Prominent stool throughout the colon favors constipation. Sigmoid colon diverticulosis. Diffuse subcutaneous edema, particularly tracking along the flanks and lateral to the hips. Fused sacroiliac joints. Bilateral hip arthropathy, left greater than right. Prior left posterior acetabular repair. High density along the infra pannicular fold, probably due to treatment for intertrigo. 3. Emphysema and aortic atherosclerosis.  Discharge Instructions: Discharge Instructions     Diet - low sodium heart  healthy   Complete by: As directed    Discharge wound care:   Complete by: As directed    Wound care  Daily      Comments: Measure and cut length of InterDry to fit in abdominal pannus. Place into pannus after washing with soap and water and patting dry. Tuck InterDry fabric into skin folds in a single layer, allow for 2 inches of overhang from skin edges to allow for wicking to occur May remove to bathe; dry area thoroughly and then tuck into affected areas again  Do not apply any creams or ointments when using InterDry DO NOT THROW AWAY FOR 5 DAYS unless soiled with stool DO NOT [facility] product, this will inactivate the silver in the material  New sheet of Interdry should be applied after 5 days of use if patient continues to have skin breakdown   Increase activity slowly   Complete by: As directed  Please continue to take your Lexapro daily, continue to make daily goals, and practice ADL's. Please continue to participate in Physical and occupational therapy. Please make an appointment with Dermatology.  Signed: Bobbye Morton, MD 09/17/2020, 3:09 PM   Pager: [phone]

## 2020-09-17 NOTE — Progress Notes (Signed)
Report given to Vivia Ewing at [employer]

## 2020-09-17 NOTE — Discharge Instructions (Signed)
Dear [name],   Thank you so much for allowing [place] to be part of your care!  You were admitted to [facility] for a significant weakness with a fall and significant weight loss.    POST-HOSPITAL & CARE INSTRUCTIONS 1. Please continue to set daily goals and take your Lexapro 10mg  daily.  2. Please make an appointment to see Dermatology. 3. Please let PCP/Specialists know of any changes that were made.  4. Please see medications section of this packet for any medication changes.   DOCTOR'S APPOINTMENT & FOLLOW UP CARE INSTRUCTIONS  No future appointments.  RETURN PRECAUTIONS:   Take care and be well!  Internal Medicine Teaching Service Inpatient Team [place]  [facility]  [address] [place], [place] [phone]

## 2020-09-18 LAB — COPPER, SERUM: Copper: 57 ug/dL — ABNORMAL LOW (ref 69–132)

## 2020-09-18 LAB — VITAMIN E
Vitamin E (Alpha Tocopherol): 7.5 mg/L — ABNORMAL LOW (ref 9.0–29.0)
Vitamin E(Gamma Tocopherol): 0.7 mg/L (ref 0.5–4.9)

## 2020-09-25 NOTE — Progress Notes (Signed)
Vitamin E level (alpha tocopherol) came back low at 7.5 mg/L. His gamma tocopherol level came back at the low end of normal with a value of 0.7 mg/L.   Copper level also came back low at 57 mcg/dL.   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   His SNF was called and I spoke to his RN today [phone]). Vitamin E 15 mg po QD has been prescribed; copper supplementation at 2.5 mg po QD has also been prescribed (verbal orders taken by SNF RN). He also should have follow up with his PCP for repeat vitamin E and copper levels in 1 month. RN stated that this information would be transmitted when an appointment with his new PCP is established.   Electronically signed: Dr. Caryl Pina

## 2020-10-12 ENCOUNTER — Encounter (HOSPITAL_COMMUNITY): Payer: Self-pay | Admitting: Emergency Medicine

## 2020-10-12 ENCOUNTER — Emergency Department (HOSPITAL_COMMUNITY)
Admission: EM | Admit: 2020-10-12 | Discharge: 2020-10-13 | Disposition: A | Discharge status: Home / Self Care | Payer: Medicare Other | Attending: Emergency Medicine | Admitting: Emergency Medicine

## 2020-10-12 DIAGNOSIS — M549 Dorsalgia, unspecified: Secondary | ICD-10-CM | POA: Diagnosis not present

## 2020-10-12 DIAGNOSIS — E119 Type 2 diabetes mellitus without complications: Secondary | ICD-10-CM | POA: Diagnosis not present

## 2020-10-12 DIAGNOSIS — F17210 Nicotine dependence, cigarettes, uncomplicated: Secondary | ICD-10-CM | POA: Diagnosis not present

## 2020-10-12 DIAGNOSIS — G8929 Other chronic pain: Secondary | ICD-10-CM | POA: Diagnosis not present

## 2020-10-12 DIAGNOSIS — I10 Essential (primary) hypertension: Secondary | ICD-10-CM | POA: Insufficient documentation

## 2020-10-12 MED ORDER — CYCLOBENZAPRINE HCL 10 MG PO TABS
5.0000 mg | ORAL_TABLET | Freq: Once | ORAL | Status: AC
  Administered 2020-10-12: 5 mg via ORAL
  Filled 2020-10-12: qty 1

## 2020-10-12 MED ORDER — ACETAMINOPHEN 325 MG PO TABS
650.0000 mg | ORAL_TABLET | Freq: Once | ORAL | Status: AC
  Administered 2020-10-12: 650 mg via ORAL
  Filled 2020-10-12: qty 2

## 2020-10-12 NOTE — Social Work (Signed)
CSW called brother, [name] @ [phone] to gather collateral information. [name] and Pt believe that Pt did not receive adequate treatment at [facility], Noralee Stain insisted that Pt be transported to ED to receive back surgery. [name] informed CSW that Pt has an appointment with a PCP on 10/28 but did not give the name of PCP. [name] states that if Pt is d/c'ed home with [facility], [name] will be available to assist Pt over the weekend while waiting for [facility] to begin.

## 2020-10-12 NOTE — ED Notes (Signed)
Ptar called 

## 2020-10-12 NOTE — Care Management (Signed)
Home Health Referral faxed to [facility] [phone]

## 2020-10-12 NOTE — ED Notes (Signed)
pts brother updated on wait times for transport

## 2020-10-12 NOTE — ED Provider Notes (Signed)
[facility] EMERGENCY DEPARTMENT Provider Note   CSN: [id number] Arrival date & time: 10/12/20  1526     History Chief Complaint  Patient presents with  . social work consult    [name] is a 64 y.o. male presenting to the emergency department for referral to spine doctor and social work consult from skilled nursing facility.  He was recently admitted in the end of September and discharged to an SNF for chronic lower extremity weakness and chronic low back pain.  He states he was having too much pain during therapy and his insurance "ran out" therefore he wanted to return home and complete therapy at home.  His brother arrived to the SNF to bring him home, however recalling the previous time his brother had brought him home from an SNF - the patient fell in the front yard - therefore the brother instructed the facility to call EMS to transport him home.  However, EMS informed the patient and his brother that they could not transport him home and that EMS was only able to transport to the hospital.  Therefore, patient came to the hospital checked into the ED. He reports no new change in his chronic back pain.   The history is provided by the patient and medical records.       Past Medical History:  Diagnosis Date  . Cellulitis 07/17/2020   LEFT HIP  . Diabetes mellitus   . Hyperlipidemia   . Hypertension     Patient Active Problem List   Diagnosis Date Noted  . Malnutrition of moderate degree 09/11/2020  . Loss of weight 09/07/2020  . Candidal intertrigo 09/07/2020  . Fall at home 09/06/2020  . Hypoglycemia 07/31/2020  . Purulent Cellulitis of left hip 07/15/2020  . Recurrent falls 07/15/2020  . 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 Physical deconditioning 07/15/2020  . Asymptomatic bacteriuria 07/15/2020  . Diabetes mellitus (HCC) 01/28/2012    Past Surgical History:  Procedure Laterality Date  . SPINE SURGERY         No  family history on file.  Social History   Tobacco Use  . Smoking status: Current Every Day Smoker    Packs/day: 0.50    Years: 40.00    Pack years: 20.00    Types: Cigarettes  . Smokeless tobacco: Never Used  Vaping Use  . Vaping Use: Never used  Substance Use Topics  . Alcohol use: No  . Drug use: No    Home Medications Prior to Admission medications   Medication Sig Start Date End Date Taking? Authorizing Provider  escitalopram (LEXAPRO) 10 MG tablet Take 1 tablet (10 mg total) by mouth daily. 09/18/20   Bobbye Morton, MD  Multiple Vitamin (MULTIVITAMIN WITH MINERALS) TABS tablet Take 1 tablet by mouth daily. 09/18/20   Bobbye Morton, MD  Nutritional Supplements (ENSURE PO) Take 1 Bottle by mouth daily with breakfast.    [provider]  Triamcinolone Acetonide (TRIAMCINOLONE 0.1 % CREAM : EUCERIN) CREA Apply 1 application topically 3 (three) times daily. 09/17/20   Bobbye Morton, MD    Allergies    Lipitor [atorvastatin calcium]  Review of Systems   Review of Systems  All other systems reviewed and are negative.   Physical Exam Updated Vital Signs BP (!) 143/61 (BP Location: Left Arm)   Pulse 87   Temp 98 F (36.7 C) (Oral)   Resp 18   Ht 5\' 10"  (1.778 m)   Wt  100.7 kg   SpO2 99%   BMI 31.85 kg/m   Physical Exam Vitals and nursing note reviewed.  Constitutional:      General: He is not in acute distress.    Appearance: He is well-developed.  HENT:     Head: Normocephalic and atraumatic.  Eyes:     Conjunctiva/sclera: Conjunctivae normal.  Cardiovascular:     Rate and Rhythm: Normal rate.  Pulmonary:     Effort: Pulmonary effort is normal.  Abdominal:     Palpations: Abdomen is soft.  Skin:    General: Skin is warm.  Neurological:     Mental Status: He is alert.  Psychiatric:        Behavior: Behavior normal.     ED Results / Procedures / Treatments   Labs (all labs ordered are listed, but only abnormal results are  displayed) Labs Reviewed - No data to display  EKG None  Radiology No results found.  Procedures Procedures (including critical care time)  Medications Ordered in ED Medications - No data to display  ED Course  I have reviewed the triage vital signs and the nursing notes.  Pertinent labs & imaging results that were available during my care of the patient were reviewed by me and considered in my medical decision making (see chart for details).    MDM Rules/Calculators/A&P                          Patient presented to the ED via EMS upon discharge from SNF facility with request for referral to spine specialist and social work resources.  Patient has no new changes in his chronic back pain to warrant emergent medical work-up.  Case management and social work were consulted and have assisted with contacting home health for resources.  Provided referral to spinal specialist for chronic back pain and discharge paperwork as well as PCP referral.   Final Clinical Impression(s) / ED Diagnoses Final diagnoses:  Chronic bilateral back pain, unspecified back location    Rx / DC Orders ED Discharge Orders    None       Berlynn Warsame, [place] N, PA-C 10/12/20 2050    Gwyneth Sprout, MD 10/12/20 2313

## 2020-10-12 NOTE — ED Notes (Signed)
ptar called at this time.

## 2020-10-12 NOTE — Discharge Instructions (Addendum)
It is important that you begin seeing a primary care doctor regularly for your basic health needs. You can schedule appointment with the spine specialist for further evaluation of your chronic back pain. The case manager and social worker have also contacted home health for further assistance for you at home. Continue taking your medications as directed. Any prescribed medications from your hospital discharge were sent to the [facility] at [address]

## 2020-10-12 NOTE — ED Triage Notes (Signed)
Pt arrives via EMS from [facility] where pt was there for PT. Pts insurance stopped covering and pt needs social work help. Pt has chronic back pain and no new changes with that.

## 2020-10-13 NOTE — ED Notes (Signed)
Pt discharged with ptar, NADN, vss on discharge, pt able to shift to stretcher w/o difficulty, no further questions asked at this time.

## 2020-10-13 NOTE — TOC Progression Note (Addendum)
Transition of Care [facility]) - Progression Note    Patient Details  Name: ELIS SAUBER MRN: [id number] Date of Birth: Feb 11, 1956  Transition of Care [facility]) CM/SW Contact  Lockie Pares, RN Phone Number: 10/13/2020, 1:26 PM  Clinical Narrative:    RN , PT OT, CSW, [facility] will not be able to cover at this time due to staffing restrictions in that area.  1330 reached out to [facility] with information waiting to hear back regarding if they can take this patient for services+ 1343- they cannot take patient as they do not work with his insurance 1345- reached out to [facility] [place] at [place]-        [facility]                                                 Social Determinants of Health (SDOH) Interventions    Readmission Risk Interventions Readmission Risk Prevention Plan 09/17/2020 08/01/2020  Post Dischage Appt Not Complete Complete  Appt Comments SNF placement -  Medication Screening Complete Complete  Transportation Screening Complete Complete  Some recent data might be hidden

## 2020-10-14 ENCOUNTER — Telehealth: Payer: Self-pay | Admitting: Surgery

## 2020-10-14 NOTE — Telephone Encounter (Signed)
ED CM received in handoff that patient's [facility] referral has been accepted by [facility], CM contacted patient with update, and explained that a nurse will be reaching out to him to arrange the initial [facility] visit, patient verbalized understanding teach back done.

## 2021-04-29 ENCOUNTER — Ambulatory Visit: Payer: Medicare Other | Attending: Internal Medicine | Admitting: Physical Therapy

## 2021-04-29 ENCOUNTER — Encounter: Payer: Self-pay | Admitting: Physical Therapy

## 2021-04-29 ENCOUNTER — Other Ambulatory Visit: Payer: Self-pay

## 2021-04-29 DIAGNOSIS — M25552 Pain in left hip: Secondary | ICD-10-CM | POA: Insufficient documentation

## 2021-04-29 DIAGNOSIS — G8929 Other chronic pain: Secondary | ICD-10-CM | POA: Diagnosis present

## 2021-04-29 DIAGNOSIS — M25562 Pain in left knee: Secondary | ICD-10-CM | POA: Diagnosis present

## 2021-04-29 DIAGNOSIS — R2689 Other abnormalities of gait and mobility: Secondary | ICD-10-CM | POA: Insufficient documentation

## 2021-04-29 DIAGNOSIS — M545 Low back pain, unspecified: Secondary | ICD-10-CM | POA: Diagnosis present

## 2021-04-29 DIAGNOSIS — M6281 Muscle weakness (generalized): Secondary | ICD-10-CM | POA: Insufficient documentation

## 2021-04-29 DIAGNOSIS — M25551 Pain in right hip: Secondary | ICD-10-CM | POA: Insufficient documentation

## 2021-04-29 DIAGNOSIS — M25561 Pain in right knee: Secondary | ICD-10-CM | POA: Insufficient documentation

## 2021-04-29 NOTE — Patient Instructions (Signed)
Access Code: [email or web address] URL: [email or web address] Date: 04/29/2021 Prepared by: Rosana Hoes  Exercises Seated Hip Abduction with Resistance - 2-3 x daily - 7 x weekly - 2 sets - 15 reps Seated March with Resistance - 2-3 x daily - 7 x weekly - 2 sets - 20 reps Seated Knee Extension with Anchored Resistance - 2-3 x daily - 7 x weekly - 2 sets - 15 reps Seated Knee Flexion with Anchored Resistance - 2-3 x daily - 7 x weekly - 2 sets - 15 reps Seated Heel Toe Raises - 1 x daily - 7 x weekly - 2 sets - 20 reps

## 2021-04-30 ENCOUNTER — Telehealth: Payer: Self-pay

## 2021-04-30 ENCOUNTER — Encounter: Payer: Self-pay | Admitting: Physical Therapy

## 2021-04-30 NOTE — Telephone Encounter (Signed)
   [name] DOB: 1956-11-10 MRN: [id number]   RIDER WAIVER AND RELEASE OF LIABILITY  For purposes of improving physical access to our facilities, [place] is pleased to partner with third parties to provide [facility] patients or other authorized individuals the option of convenient, on-demand ground transportation services (the Chiropractor") through use of the technology service that enables users to request on-demand ground transportation from independent third-party providers.  By opting to use and accept these [employer], I, the undersigned, hereby agree on behalf of myself, and on behalf of any minor child using the [employer] for whom I am the parent or legal guardian, as follows:  1. Science writer provided to me are provided by independent third-party transportation providers who are not [employer] or employees and who are unaffiliated with [employer]. 2. [place] is neither a transportation carrier nor a common or public carrier. 3. [place] has no control over the quality or safety of the transportation that occurs as a result of the [employer]. 4. [facility] cannot guarantee that any third-party transportation provider will complete any arranged transportation service. 5. [place] makes no representation, warranty, or guarantee regarding the reliability, timeliness, quality, safety, suitability, or availability of any of the Transport Services or that they will be error free. 6. I fully understand that traveling by vehicle involves risks and dangers of serious bodily injury, including permanent disability, paralysis, and death. I agree, on behalf of myself and on behalf of any minor child using the Transport Services for whom I am the parent or legal guardian, that the entire risk arising out of my use of the [employer] remains solely with me, to the maximum extent permitted under applicable law. 7. The [employer] are provided "as is" and "as available." [place] disclaims all representations and warranties, express, implied or statutory, not expressly set out in these terms, including the implied warranties of merchantability and fitness for a particular purpose. 8. I hereby waive and release [place], its agents, employees, officers, directors, representatives, insurers, attorneys, assigns, successors, subsidiaries, and affiliates from any and all past, present, or future claims, demands, liabilities, actions, causes of action, or suits of any kind directly or indirectly arising from acceptance and use of the [employer]. 9. I further waive and release [place] and its affiliates from all present and future liability and responsibility for any injury or death to persons or damages to property caused by or related to the use of the [employer]. 10. I have read this Waiver and Release of Liability, and I understand the terms used in it and their legal significance. This Waiver is freely and voluntarily given with the understanding that my right (as well as the right of any minor child for whom I am the parent or legal guardian using the [employer]) to legal recourse against  in connection with the [employer] is knowingly surrendered in return for use of these services.   I attest that I read the consent document to [name], gave Mr. Bailon the opportunity to ask questions and answered the questions asked (if any). I affirm that [name] then provided consent for he's participation in this program.     [name]

## 2021-04-30 NOTE — Therapy (Signed)
[facility] [address] [place], [place], [address] Phone: [phone]   Fax:  [phone]  Physical Therapy Evaluation  Patient Details  Name: [name] MRN: [id number] Date of Birth: 1956-10-04 Referring Provider (PT): Fleet ContrasAvbuere, Edwin, MD   Encounter Date: 04/29/2021   PT End of Session - 04/29/21 1629    Visit Number 1    Number of Visits 16    Date for PT Re-Evaluation 06/24/21    Authorization Type UHC MCR    Authorization Time Period KX by 15th visit    Progress Note Due on Visit 10    PT Start Time 1406   patient arrived late   PT Stop Time 1445    PT Time Calculation (min) 39 min    Equipment Utilized During Treatment Gait belt    Activity Tolerance Patient tolerated treatment well    Behavior During Therapy WFL for tasks assessed/performed           Past Medical History:  Diagnosis Date  . Cellulitis 07/17/2020   LEFT HIP  . Diabetes mellitus   . Hyperlipidemia   . Hypertension     Past Surgical History:  Procedure Laterality Date  . SPINE SURGERY      There were no vitals filed for this visit.    Subjective Assessment - 04/29/21 1409    Subjective Patient reports he has not been able to walk for over 9-10 months. He states he slipped and fell and hurt the left hip, which is when he stopped being able to walk. He was previouslly walking with a cane. He has been in 3 separate nursing homes over the past year, but he is back home right now for the past 3 months. He uses a wheelchair for all mobility. Patient reports he can stand to transfer but has to sit really quick or else his legs will collapse. Patient also notes he has pain in his back, this is chronic and he goes to pain management for this. Notes frequent falls at home, most recently occuring when he has slid off the edge of the bed.    Limitations Standing;Walking;House hold activities    How long can you sit comfortably? No limitation    How  long can you stand comfortably? A couple seconds    How long can you walk comfortably? Unable    Patient Stated Goals Patient reports he wants to walk again    Currently in Pain? Yes    Pain Score 8     Pain Location Back    Pain Orientation Lower    Pain Descriptors / Indicators Shooting    Pain Type Chronic pain    Pain Onset More than a month ago    Pain Frequency Constant    Aggravating Factors  Laying on back, bending forward    Pain Relieving Factors Medication    Effect of Pain on Daily Activities Patient limited with all mobility              [facility] PT Assessment - 04/30/21 0001      Assessment   Medical Diagnosis Hip and knee pain    Referring Provider (PT) Fleet ContrasAvbuere, Edwin, MD    Onset Date/Surgical Date --   patient reports > 10 month history of inability to walk   Next MD Visit Not scheduled    Prior Therapy Yes      Precautions   Precautions Fall    Precaution Comments Patient currently non-ambulatory using  wheelchair for mobility and performing stand pivot transfers      Restrictions   Weight Bearing Restrictions No      Balance Screen   Has the patient fallen in the past 6 months Yes    How many times? Patient reports frequent falls, unable to recall specific number    Has the patient had a decrease in activity level because of a fear of falling?  Yes    Is the patient reluctant to leave their home because of a fear of falling?  Yes      Home Environment   Living Environment Private residence    Living Arrangements Alone    Type of Home House    Home Access Ramped entrance    Home Layout One level    Home Equipment Wheelchair - manual;Cane - single point;Walker - 2 wheels      Prior Function   Level of Independence Needs assistance with transfers;Needs assistance with gait;Needs assistance with ADLs;Needs assistance with homemaking    Vocation On disability    Leisure None reported      Cognition   Overall Cognitive Status Within Functional Limits  for tasks assessed      Observation/Other Assessments   Observations Patient appears in no apparent distress, arrives in wheelchair    Focus on Therapeutic Outcomes (FOTO)  Not assessed due to wrong set-up      Sensation   Light Touch Appears Intact      Coordination   Gross Motor Movements are Fluid and Coordinated Yes      Posture/Postural Control   Posture Comments Grossly slouched posturing      ROM / Strength   AROM / PROM / Strength Strength      Strength   Overall Strength Comments Strength assessment performed in seated position    Strength Assessment Site Hip;Knee;Ankle    Right/Left Hip Right;Left    Right Hip Flexion 3+/5    Right Hip ABduction 3+/5    Left Hip Flexion 3+/5    Left Hip ABduction 3+/5    Right/Left Knee Right;Left    Right Knee Flexion 4-/5    Right Knee Extension 4-/5   unable to achieve full knee extension   Left Knee Flexion 4-/5    Left Knee Extension 4-/5   unable to achieve full knee extension   Right/Left Ankle Right;Left    Right Ankle Dorsiflexion 4-/5    Left Ankle Dorsiflexion 4-/5      Bed Mobility   Bed Mobility Not assessed      Transfers   Transfers Sit to Stand;Stand Pivot Transfers    Sit to Stand 3: Mod assist    Sit to Stand Details (indicate cue type and reason) patient with heavy reliance on BUE for assist, required manual assist to stand and initial balance, unable to maintain standing and required to sit back down    Stand Pivot Transfers 3: Mod assist    Stand Pivot Transfer Details (indicate cue type and reason) patient with heavy reliance on BUE for assist, required manual assist to stand and pivot, unable to maintain standing or control decent back to table or chair      Ambulation/Gait   Ambulation/Gait No    Gait Comments Patient unable to walk at this time                      Objective measurements completed on examination: See above findings.       [facility]  Adult PT Treatment/Exercise -  04/30/21 0001      Exercises   Exercises Knee/Hip      Knee/Hip Exercises: Seated   Long Arc Quad 2 sets;15 reps    Long Arc Quad Limitations red band    Clamshell with TheraBand Red   2 x 15   Other Seated Knee/Hip Exercises Heel-toe raises 2 x 20    Marching 2 sets;20 reps    Marching Limitations red band    Hamstring Curl 2 sets;15 reps    Hamstring Limitations red band                  PT Education - 04/29/21 1628    Education Details Exam findings, POC, HEP    Person(s) Educated Patient    Methods Explanation;Demonstration;Tactile cues;Verbal cues;Handout    Comprehension Verbalized understanding;Returned demonstration;Verbal cues required;Tactile cues required;Need further instruction            PT Short Term Goals - 04/30/21 0811      PT SHORT TERM GOAL #1   Title Patient will be I with initial HEP to progress with PT    Time 4    Period Weeks    Status New    Target Date 05/27/21      PT SHORT TERM GOAL #2   Title Patient will be able to maintain standing position for >/= 10 seconds with min A and use of RW for assist to improve transfer ability    Time 4    Period Weeks    Status New    Target Date 05/27/21      PT SHORT TERM GOAL #3   Title Patient will be able to perform stand pivot transfer WC<>mat table using RW with supervision to improve safety with transfers at home    Time 4    Period Weeks    Status New    Target Date 05/27/21      PT SHORT TERM GOAL #4   Title Patient will exhibit improved knee strength >/= 4/5 MMT to improve stranding ability    Time 4    Period Weeks    Status New    Target Date 05/27/21      PT SHORT TERM GOAL #5   Title Patient will report </= 5/10 pain level to reduce functional limitations    Time 4    Period Weeks    Status New    Target Date 05/27/21             PT Long Term Goals - 04/30/21 0816      PT LONG TERM GOAL #1   Title Patient will be I with final HEP to maintain progress from PT     Time 8    Period Weeks    Status New    Target Date 06/24/21      PT LONG TERM GOAL #2   Title Patient will be able to ambulate >/= 12ft using LRAD and supervision in order to improve household mobility    Time 8    Period Weeks    Status New    Target Date 06/24/21      PT LONG TERM GOAL #3   Title Patient will be able to stand >/= 1 minute with LRAD and supervision to improve homemaking ability    Time 8    Period Weeks    Status New    Target Date 06/24/21      PT LONG TERM GOAL #4  Title Patient will exhibit >/= 4+/5 MMT knee strength to improve ability to stand and walk    Time 8    Period Weeks    Status New    Target Date 06/24/21      PT LONG TERM GOAL #5   Title Patient will be able to perform all transfers without assist or supervision using LRAD in order to improve safety and independence    Time 8    Period Weeks    Status New    Target Date 06/24/21                  Plan - 04/29/21 1643    Clinical Impression Statement Patient presents to PT with report of being unable to walk for approximately 10 months, difficulty to attain a specific history from patient. He reports previous left hip surgery, and then re-injury secondary to a fall and hospital admission on 07/15/2020, and it seems like since that time patient has been non-ambulatory and using wheelchair for all mobility. Patient has had frequent hospital admissions due to falls and has been in multiple SNFs for rehab but is currently living alone at home with family assist. He currently exhibits gross weakness of bilateral LEs, assessment limited due to patient reporting inability to lie on back secondary to severe low back pain. Patient required mod A for sit<>stand and stand pivot transfers this visit, with much difficulty maintaining a standing position and inability to control decent back to table or chair. Patient was provided with initial HEP focusing on seated level strengthening, and he would  benefit from continued skilled PT to progress his strength and ability to stand and walk in order to improve independence and reduce risk of falls.    Personal Factors and Comorbidities Fitness;Past/Current Experience;Time since onset of injury/illness/exacerbation;Comorbidity 3+;Transportation    Comorbidities DM, HTN, HLD, chronic pain syndrome    Examination-Activity Limitations Bathing;Locomotion Level;Transfers;Carry;Squat;Stairs;Dressing;Hygiene/Grooming;Lift;Toileting;Stand    Examination-Participation Restrictions Meal Prep;Cleaning;Community Activity;Driving;Shop;Laundry;Yard Work    Conservation officer, historic buildings Evolving/Moderate complexity    Clinical Decision Making Moderate    Rehab Potential Fair    PT Frequency 2x / week    PT Duration 8 weeks    PT Treatment/Interventions ADLs/Self Care Home Management;Aquatic Therapy;Cryotherapy;Electrical Stimulation;Iontophoresis 4mg /ml Dexamethasone;Moist Heat;DME Instruction;Neuromuscular re-education;Balance training;Therapeutic exercise;Therapeutic activities;Functional mobility training;Stair training;Gait training;Patient/family education;Manual techniques;Dry needling;Passive range of motion;Taping;Vasopneumatic Device;Spinal Manipulations;Joint Manipulations    PT Next Visit Plan Review HEP and progress PRN, progress LE strength, sit<>stand and transfer training in parallel bars    PT Home Exercise Plan [id number]    Consulted and Agree with Plan of Care Patient           Patient will benefit from skilled therapeutic intervention in order to improve the following deficits and impairments:  Difficulty walking,Decreased range of motion,Decreased activity tolerance,Pain,Decreased balance,Impaired flexibility,Postural dysfunction,Decreased strength,Decreased mobility,Decreased endurance  Visit Diagnosis: Muscle weakness (generalized)  Other abnormalities of gait and mobility  Chronic bilateral low back pain, unspecified whether  sciatica present  Pain in left hip  Pain in right hip  Chronic pain of left knee  Chronic pain of right knee     Problem List Patient Active Problem List   Diagnosis Date Noted  . Malnutrition of moderate degree 09/11/2020  . Loss of weight 09/07/2020  . Candidal intertrigo 09/07/2020  . Fall at home 09/06/2020  . Hypoglycemia 07/31/2020  . Purulent Cellulitis of left hip 07/15/2020  . Recurrent falls 07/15/2020  . 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 Physical  deconditioning 07/15/2020  . Asymptomatic bacteriuria 07/15/2020  . Diabetes mellitus (HCC) 01/28/2012    Rosana Hoes, PT, DPT, LAT, ATC 04/30/21  8:20 AM Phone: [phone] Fax: [phone]   [facility] [address] [place], [place], [address] Phone: [phone]   Fax:  [phone]  Name: [name] MRN: [id number] Date of Birth: [birth year]

## 2021-05-01 ENCOUNTER — Encounter: Payer: Self-pay | Admitting: Physical Therapy

## 2021-05-01 ENCOUNTER — Ambulatory Visit: Payer: Medicare Other | Admitting: Physical Therapy

## 2021-05-01 ENCOUNTER — Other Ambulatory Visit: Payer: Self-pay

## 2021-05-01 DIAGNOSIS — M6281 Muscle weakness (generalized): Secondary | ICD-10-CM | POA: Diagnosis not present

## 2021-05-01 DIAGNOSIS — G8929 Other chronic pain: Secondary | ICD-10-CM

## 2021-05-01 DIAGNOSIS — M545 Low back pain, unspecified: Secondary | ICD-10-CM

## 2021-05-01 DIAGNOSIS — R2689 Other abnormalities of gait and mobility: Secondary | ICD-10-CM

## 2021-05-01 DIAGNOSIS — M25552 Pain in left hip: Secondary | ICD-10-CM

## 2021-05-01 DIAGNOSIS — M25551 Pain in right hip: Secondary | ICD-10-CM

## 2021-05-01 NOTE — Therapy (Signed)
[facility] [address] [place], [place], [address] Phone: [phone]   Fax:  [phone]  Physical Therapy Treatment  Patient Details  Name: [name] MRN: [id number] Date of Birth: 02/11/1956 Referring Provider (PT): Fleet Contras, MD   Encounter Date: 05/01/2021   PT End of Session - 05/01/21 1650    Visit Number 2    Number of Visits 16    Date for PT Re-Evaluation 06/24/21    Authorization Type UHC MCR    Authorization Time Period KX by 15th visit    Progress Note Due on Visit 10    PT Start Time 1624    PT Stop Time 1705    PT Time Calculation (min) 41 min    Equipment Utilized During Treatment Gait belt    Activity Tolerance Patient tolerated treatment well    Behavior During Therapy WFL for tasks assessed/performed           Past Medical History:  Diagnosis Date  . Cellulitis 07/17/2020   LEFT HIP  . Diabetes mellitus   . Hyperlipidemia   . Hypertension     Past Surgical History:  Procedure Laterality Date  . SPINE SURGERY      There were no vitals filed for this visit.   Subjective Assessment - 05/01/21 1632    Subjective Patient reports he is doing well, no falls since last visit and he has been working on his exercises. States they are alright.    Patient Stated Goals Patient reports he wants to walk again    Currently in Pain? Yes    Pain Score 9     Pain Location Back    Pain Orientation Lower    Pain Descriptors / Indicators Shooting    Pain Type Chronic pain    Pain Onset More than a month ago    Pain Frequency Constant                             [facility] Adult PT Treatment/Exercise - 05/01/21 0001      Exercises   Exercises Knee/Hip      Knee/Hip Exercises: Aerobic   Nustep L6 x 4 min, then L3 x 3 min   UE/LE     Knee/Hip Exercises: Standing   Hip Flexion Limitations Marching in parallel bars   heavy reliance on UEs for support, unable to attain upright  posture due to back pain, mod-min A for control and blocking knees, LLE with poor coordination during march   Other Standing Knee Exercises Sit<>stand with 30 sec stand holds in parellel bars x 5   heavy reliance on UEs for support, unable to attain upright posture due to back pain, mod-min A for control and blocking knees   Other Standing Knee Exercises Standing weight shifts in parallel bars 3 x 10 sec   heavy reliance on UEs for support, unable to attain upright posture due to back pain, mod-min A for control and blocking knees     Knee/Hip Exercises: Seated   Long Arc Quad 2 sets;15 reps    Long Arc Quad Weight 2 lbs.    Clamshell with TheraBand Green   2 x 15   Other Seated Knee/Hip Exercises Heel-toe raises 2 x 20   2# ankle weight   Other Seated Knee/Hip Exercises Adduction ball squeeze 2 x 10 with 5 sec hold    Marching 2 sets;20 reps    Marching Limitations  2# ankle weight    Hamstring Curl 2 sets;15 reps    Hamstring Limitations red band                  PT Education - 05/01/21 1650    Education Details HEP    Person(s) Educated Patient    Methods Explanation    Comprehension Verbalized understanding            PT Short Term Goals - 04/30/21 0811      PT SHORT TERM GOAL #1   Title Patient will be I with initial HEP to progress with PT    Time 4    Period Weeks    Status New    Target Date 05/27/21      PT SHORT TERM GOAL #2   Title Patient will be able to maintain standing position for >/= 10 seconds with min A and use of RW for assist to improve transfer ability    Time 4    Period Weeks    Status New    Target Date 05/27/21      PT SHORT TERM GOAL #3   Title Patient will be able to perform stand pivot transfer WC<>mat table using RW with supervision to improve safety with transfers at home    Time 4    Period Weeks    Status New    Target Date 05/27/21      PT SHORT TERM GOAL #4   Title Patient will exhibit improved knee strength >/= 4/5 MMT to  improve stranding ability    Time 4    Period Weeks    Status New    Target Date 05/27/21      PT SHORT TERM GOAL #5   Title Patient will report </= 5/10 pain level to reduce functional limitations    Time 4    Period Weeks    Status New    Target Date 05/27/21             PT Long Term Goals - 04/30/21 0816      PT LONG TERM GOAL #1   Title Patient will be I with final HEP to maintain progress from PT    Time 8    Period Weeks    Status New    Target Date 06/24/21      PT LONG TERM GOAL #2   Title Patient will be able to ambulate >/= 32ft using LRAD and supervision in order to improve household mobility    Time 8    Period Weeks    Status New    Target Date 06/24/21      PT LONG TERM GOAL #3   Title Patient will be able to stand >/= 1 minute with LRAD and supervision to improve homemaking ability    Time 8    Period Weeks    Status New    Target Date 06/24/21      PT LONG TERM GOAL #4   Title Patient will exhibit >/= 4+/5 MMT knee strength to improve ability to stand and walk    Time 8    Period Weeks    Status New    Target Date 06/24/21      PT LONG TERM GOAL #5   Title Patient will be able to perform all transfers without assist or supervision using LRAD in order to improve safety and independence    Time 8    Period Weeks    Status New  Target Date 06/24/21                 Plan - 05/01/21 1651    Clinical Impression Statement Patient tolerated therapy well with no adverse effects. Therapy focused on standing tolerance and progressing gross LE strengthening. Utilitzed parallel bars this visit for standing, patient demonstrated heavy reliance on BUEs for support and PT provided min-mod A to block knees and provide stability while standing and lowering. Patient exhibits much greater weakness and coordination deficit with LLE and unable to attain upright posture due to back pain. Added resistance to LE strengthening and patient did exhibit  coordination deficit with open chain strengthening, as well as continued left sided weakness. No change made to HEP this visit. Patient would benefit from continued skiled PT to progress strength and mobility to work towards improved standing, transfers, and walking to independence.    PT Treatment/Interventions ADLs/Self Care Home Management;Aquatic Therapy;Cryotherapy;Electrical Stimulation;Iontophoresis 4mg /ml Dexamethasone;Moist Heat;DME Instruction;Neuromuscular re-education;Balance training;Therapeutic exercise;Therapeutic activities;Functional mobility training;Stair training;Gait training;Patient/family education;Manual techniques;Dry needling;Passive range of motion;Taping;Vasopneumatic Device;Spinal Manipulations;Joint Manipulations    PT Next Visit Plan Review HEP and progress PRN, progress LE strength, sit<>stand and transfer training in parallel bars    PT Home Exercise Plan [id number]    Consulted and Agree with Plan of Care Patient           Patient will benefit from skilled therapeutic intervention in order to improve the following deficits and impairments:  Difficulty walking,Decreased range of motion,Decreased activity tolerance,Pain,Decreased balance,Impaired flexibility,Postural dysfunction,Decreased strength,Decreased mobility,Decreased endurance  Visit Diagnosis: Muscle weakness (generalized)  Other abnormalities of gait and mobility  Chronic bilateral low back pain, unspecified whether sciatica present  Pain in left hip  Pain in right hip  Chronic pain of left knee  Chronic pain of right knee     Problem List Patient Active Problem List   Diagnosis Date Noted  . Malnutrition of moderate degree 09/11/2020  . Loss of weight 09/07/2020  . Candidal intertrigo 09/07/2020  . Fall at home 09/06/2020  . Hypoglycemia 07/31/2020  . Purulent Cellulitis of left hip 07/15/2020  . Recurrent falls 07/15/2020  . 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  Physical deconditioning 07/15/2020  . Asymptomatic bacteriuria 07/15/2020  . Diabetes mellitus (HCC) 01/28/2012    03/27/2012, PT, DPT, LAT, ATC 05/01/21  5:14 PM Phone: [phone] Fax: [phone]   [facility] [address] [place], [place], [place] Phone: [phone]   Fax:  [phone]  Name: [name] MRN: [name] Date of Birth: 07-07-1956

## 2021-05-07 ENCOUNTER — Encounter: Payer: Self-pay | Admitting: Physical Therapy

## 2021-05-07 ENCOUNTER — Other Ambulatory Visit: Payer: Self-pay

## 2021-05-07 ENCOUNTER — Ambulatory Visit: Payer: Medicare Other | Admitting: Physical Therapy

## 2021-05-07 DIAGNOSIS — M25551 Pain in right hip: Secondary | ICD-10-CM

## 2021-05-07 DIAGNOSIS — M6281 Muscle weakness (generalized): Secondary | ICD-10-CM | POA: Diagnosis not present

## 2021-05-07 DIAGNOSIS — R2689 Other abnormalities of gait and mobility: Secondary | ICD-10-CM

## 2021-05-07 DIAGNOSIS — G8929 Other chronic pain: Secondary | ICD-10-CM

## 2021-05-07 DIAGNOSIS — M25552 Pain in left hip: Secondary | ICD-10-CM

## 2021-05-07 NOTE — Therapy (Signed)
[facility] Outpatient Rehabilitation [facility] [address] [place], [place], [address] Phone: [phone]   Fax:  [phone]  Physical Therapy Treatment  Patient Details  Name: MASAO JUNKER MRN: [id number] Date of Birth: 22-Jul-1956 Referring Provider (PT): Fleet Contras, MD   Encounter Date: 05/07/2021   PT End of Session - 05/07/21 1652    Visit Number 3    Number of Visits 16    Date for PT Re-Evaluation 06/24/21    Authorization Type UHC MCR    Authorization Time Period KX by 15th visit    Progress Note Due on Visit 10    PT Start Time 1447    PT Stop Time 1530    PT Time Calculation (min) 43 min    Equipment Utilized During Treatment Gait belt    Activity Tolerance Patient limited by pain    Behavior During Therapy WFL for tasks assessed/performed           Past Medical History:  Diagnosis Date  . Cellulitis 07/17/2020   LEFT HIP  . Diabetes mellitus   . Hyperlipidemia   . Hypertension     Past Surgical History:  Procedure Laterality Date  . SPINE SURGERY      There were no vitals filed for this visit.   Subjective Assessment - 05/07/21 1453    Subjective Patient reports his back was killing him last night. The HEP is helping the legs, but he has nothing for the back.    Limitations Standing;Walking;House hold activities    How long can you sit comfortably? No limitation    How long can you stand comfortably? A couple seconds    Currently in Pain? Yes    Pain Score 7     Pain Location Back    Pain Orientation Lower    Pain Descriptors / Indicators Sharp    Pain Frequency Constant              [facility] PT Assessment - 05/07/21 0001      Assessment   Medical Diagnosis Hip and knee pain    Referring Provider (PT) Fleet Contras, MD      Precautions   Precautions Fall    Precaution Comments Patient currently non-ambulatory using wheelchair for mobility and performing stand pivot transfers                          [facility] Adult PT Treatment/Exercise - 05/07/21 0001      Transfers   Transfers Sit to Stand;Stand to Sit    Sit to Stand 4: Min guard;4: Min Risk analyst Transfers 4: Min guard    Stand Pivot Transfer Details (indicate cue type and reason) squat pivot with CGA at gait belt and assist with w/c positioning and w/c management.      Neuro Re-ed    Neuro Re-ed Details  --   Standing at parallel bars  with weight shift R/L and decreasing UE support.  V cues for erect posture, patient with limited ability to stand erect causing back pain.     Knee/Hip Exercises: Aerobic   Nustep L4 3 x 2-3 min with short 1 min break between.   B UE/LE, patient with R lateral lean secondary c/o back pain.     Knee/Hip Exercises: Standing   Other Standing Knee Exercises Sit<>stand with 30 sec stand holds in parellel bars x 5      Manual Therapy   Manual Therapy  Soft tissue mobilization;Passive ROM    Soft tissue mobilization STM R/L hip flexor with sustained passive stretch in sidelying.    Passive ROM STM R/L hamstring during passive h/s stretch in sidelying.                  PT Education - 05/07/21 1651    Education Details Discussed need to work on stretches for HEP next visit as patient unable to stand erect secondary tissue length limitations.    Person(s) Educated Patient    Methods Explanation    Comprehension Verbalized understanding            PT Short Term Goals - 04/30/21 0811      PT SHORT TERM GOAL #1   Title Patient will be I with initial HEP to progress with PT    Time 4    Period Weeks    Status New    Target Date 05/27/21      PT SHORT TERM GOAL #2   Title Patient will be able to maintain standing position for >/= 10 seconds with min A and use of RW for assist to improve transfer ability    Time 4    Period Weeks    Status New    Target Date 05/27/21      PT SHORT TERM GOAL #3   Title Patient will be able to perform stand pivot  transfer WC<>mat table using RW with supervision to improve safety with transfers at home    Time 4    Period Weeks    Status New    Target Date 05/27/21      PT SHORT TERM GOAL #4   Title Patient will exhibit improved knee strength >/= 4/5 MMT to improve stranding ability    Time 4    Period Weeks    Status New    Target Date 05/27/21      PT SHORT TERM GOAL #5   Title Patient will report </= 5/10 pain level to reduce functional limitations    Time 4    Period Weeks    Status New    Target Date 05/27/21             PT Long Term Goals - 04/30/21 0816      PT LONG TERM GOAL #1   Title Patient will be I with final HEP to maintain progress from PT    Time 8    Period Weeks    Status New    Target Date 06/24/21      PT LONG TERM GOAL #2   Title Patient will be able to ambulate >/= 33ft using LRAD and supervision in order to improve household mobility    Time 8    Period Weeks    Status New    Target Date 06/24/21      PT LONG TERM GOAL #3   Title Patient will be able to stand >/= 1 minute with LRAD and supervision to improve homemaking ability    Time 8    Period Weeks    Status New    Target Date 06/24/21      PT LONG TERM GOAL #4   Title Patient will exhibit >/= 4+/5 MMT knee strength to improve ability to stand and walk    Time 8    Period Weeks    Status New    Target Date 06/24/21      PT LONG TERM GOAL #5   Title Patient  will be able to perform all transfers without assist or supervision using LRAD in order to improve safety and independence    Time 8    Period Weeks    Status New    Target Date 06/24/21                 Plan - 05/07/21 1531    Clinical Impression Statement Patient has made limited progress toward patient goal of standing/walking secondary back pain.  POC needs to focus on hip/knee ROM before pressing standing. Patient able to perform seated tapups to styrofoam indicating moderate hip flexor strength. Unable to extend hips or  knees in sidelying with c/o back cramps and pain. Patient will benefit from continued skilled PT with some change of focus to address deficits and maximize safe funcitonal mobility.    Personal Factors and Comorbidities Fitness;Past/Current Experience;Time since onset of injury/illness/exacerbation;Comorbidity 3+;Transportation    Comorbidities DM, HTN, HLD, chronic pain syndrome    Examination-Activity Limitations Bathing;Locomotion Level;Transfers;Carry;Squat;Stairs;Dressing;Hygiene/Grooming;Lift;Toileting;Stand    Examination-Participation Restrictions Meal Prep;Cleaning;Community Activity;Driving;Shop;Laundry;Yard Work    PT Treatment/Interventions ADLs/Self Care Home Management;Aquatic Therapy;Cryotherapy;Electrical Stimulation;Iontophoresis 4mg /ml Dexamethasone;Moist Heat;DME Instruction;Neuromuscular re-education;Balance training;Therapeutic exercise;Therapeutic activities;Functional mobility training;Stair training;Gait training;Patient/family education;Manual techniques;Dry needling;Passive range of motion;Taping;Vasopneumatic Device;Spinal Manipulations;Joint Manipulations    PT Next Visit Plan Review HEP and progress PRN, progress LE ROM and core stability for LBP management, sit<>stand and transfer training in parallel bars    PT Home Exercise Plan [id number]    Consulted and Agree with Plan of Care Patient           Patient will benefit from skilled therapeutic intervention in order to improve the following deficits and impairments:  Difficulty walking,Decreased range of motion,Decreased activity tolerance,Pain,Decreased balance,Impaired flexibility,Postural dysfunction,Decreased strength,Decreased mobility,Decreased endurance  Visit Diagnosis: Muscle weakness (generalized)  Other abnormalities of gait and mobility  Chronic bilateral low back pain, unspecified whether sciatica present  Pain in left hip  Pain in right hip     Problem List Patient Active Problem List    Diagnosis Date Noted  . Malnutrition of moderate degree 09/11/2020  . Loss of weight 09/07/2020  . Candidal intertrigo 09/07/2020  . Fall at home 09/06/2020  . Hypoglycemia 07/31/2020  . Purulent Cellulitis of left hip 07/15/2020  . Recurrent falls 07/15/2020  . 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 Physical deconditioning 07/15/2020  . Asymptomatic bacteriuria 07/15/2020  . Diabetes mellitus (HCC) 01/28/2012    03/27/2012, PT 05/07/2021, 6:49 PM  [facility] [address] [place], [place], [place] Phone: [phone]   Fax:  [phone]  Name: AKBAR SACRA MRN: [name] Date of Birth: December 08, 1956

## 2021-05-09 ENCOUNTER — Encounter: Payer: Self-pay | Admitting: Physical Therapy

## 2021-05-09 ENCOUNTER — Other Ambulatory Visit: Payer: Self-pay

## 2021-05-09 ENCOUNTER — Ambulatory Visit: Payer: Medicare Other | Admitting: Physical Therapy

## 2021-05-09 DIAGNOSIS — G8929 Other chronic pain: Secondary | ICD-10-CM

## 2021-05-09 DIAGNOSIS — M6281 Muscle weakness (generalized): Secondary | ICD-10-CM

## 2021-05-09 DIAGNOSIS — M25551 Pain in right hip: Secondary | ICD-10-CM

## 2021-05-09 DIAGNOSIS — M25552 Pain in left hip: Secondary | ICD-10-CM

## 2021-05-09 DIAGNOSIS — M25561 Pain in right knee: Secondary | ICD-10-CM

## 2021-05-09 DIAGNOSIS — M25562 Pain in left knee: Secondary | ICD-10-CM

## 2021-05-09 DIAGNOSIS — R2689 Other abnormalities of gait and mobility: Secondary | ICD-10-CM

## 2021-05-09 NOTE — Therapy (Signed)
[facility] Outpatient Rehabilitation [facility] [address] [place], [place], [address] Phone: [phone]   Fax:  [phone]  Physical Therapy Treatment  Patient Details  Name: [name] MRN: [id number] Date of Birth: April 15, 1956 Referring Provider (PT): Fleet Contras, MD   Encounter Date: 05/09/2021   PT End of Session - 05/09/21 1527    Visit Number 4    Number of Visits 16    Date for PT Re-Evaluation 06/24/21    Authorization Type UHC MCR    Authorization Time Period KX by 15th visit    PT Start Time 1447    PT Stop Time 1540    PT Time Calculation (min) 53 min    Equipment Utilized During Treatment Gait belt    Activity Tolerance Patient limited by pain    Behavior During Therapy WFL for tasks assessed/performed           Past Medical History:  Diagnosis Date  . Cellulitis 07/17/2020   LEFT HIP  . Diabetes mellitus   . Hyperlipidemia   . Hypertension     Past Surgical History:  Procedure Laterality Date  . SPINE SURGERY      There were no vitals filed for this visit.   Subjective Assessment - 05/09/21 1452    Subjective Patient reports his back is killing him today.  He states he has been compliant with HEP, not doing much else.    Limitations Standing;Walking;House hold activities    Patient Stated Goals Patient reports he wants to walk again    Currently in Pain? Yes    Pain Score 7     Pain Location Back    Pain Orientation Lower    Pain Descriptors / Indicators Sharp;Aching    Pain Type Chronic pain    Multiple Pain Sites Yes    Pain Score 8    Pain Location Knee    Pain Orientation Right;Left    Pain Descriptors / Indicators Aching    Pain Type Chronic pain    Pain Onset More than a month ago    Pain Frequency Intermittent    Aggravating Factors  Activity              [facility] PT Assessment - 05/09/21 0001      Assessment   Medical Diagnosis Hip and knee pain    Referring Provider (PT) Fleet Contras, MD       Precautions   Precautions Fall    Precaution Comments Patient currently non-ambulatory using wheelchair for mobility and performing stand pivot transfers                         [facility] Adult PT Treatment/Exercise - 05/09/21 0001      Transfers   Stand Pivot Transfers 4: Min guard    Stand Pivot Transfer Details (indicate cue type and reason) Squat pivot with w/c ant to NuStep seat and to/from mat.      Knee/Hip Exercises: Seated   Long Arc Quad 2 sets;15 reps    Long Arc Quad Weight 2 lbs.    Clamshell with TheraBand Green      Modalities   Modalities Moist Heat      Moist Heat Therapy   Number Minutes Moist Heat 5 Minutes    Moist Heat Location --   B hamstrings/quads     Manual Therapy   Manual Therapy Soft tissue mobilization;Passive ROM    Soft tissue mobilization STM R/L hip flexor with  sustained passive stretch in sidelying.    Passive ROM STM R/L hamstring during passive h/s stretch in sidelying.                  PT Education - 05/09/21 1526    Education Details Discussed patient propping feet on stool one at a time to promote hamstring stretch as well as hip flexor stretch by extending legs as tolerated in sidelying and initiat small roll toward belly. Verbal cues for foot placement forward in direction of pivot to prevent feet from getting tangled and decrease risk of falls.    Person(s) Educated Patient    Methods Explanation    Comprehension Verbalized understanding;Returned demonstration            PT Short Term Goals - 04/30/21 0811      PT SHORT TERM GOAL #1   Title Patient will be I with initial HEP to progress with PT    Time 4    Period Weeks    Status New    Target Date 05/27/21      PT SHORT TERM GOAL #2   Title Patient will be able to maintain standing position for >/= 10 seconds with min A and use of RW for assist to improve transfer ability    Time 4    Period Weeks    Status New    Target Date 05/27/21      PT SHORT  TERM GOAL #3   Title Patient will be able to perform stand pivot transfer WC<>mat table using RW with supervision to improve safety with transfers at home    Time 4    Period Weeks    Status New    Target Date 05/27/21      PT SHORT TERM GOAL #4   Title Patient will exhibit improved knee strength >/= 4/5 MMT to improve stranding ability    Time 4    Period Weeks    Status New    Target Date 05/27/21      PT SHORT TERM GOAL #5   Title Patient will report </= 5/10 pain level to reduce functional limitations    Time 4    Period Weeks    Status New    Target Date 05/27/21             PT Long Term Goals - 04/30/21 0816      PT LONG TERM GOAL #1   Title Patient will be I with final HEP to maintain progress from PT    Time 8    Period Weeks    Status New    Target Date 06/24/21      PT LONG TERM GOAL #2   Title Patient will be able to ambulate >/= 41ft using LRAD and supervision in order to improve household mobility    Time 8    Period Weeks    Status New    Target Date 06/24/21      PT LONG TERM GOAL #3   Title Patient will be able to stand >/= 1 minute with LRAD and supervision to improve homemaking ability    Time 8    Period Weeks    Status New    Target Date 06/24/21      PT LONG TERM GOAL #4   Title Patient will exhibit >/= 4+/5 MMT knee strength to improve ability to stand and walk    Time 8    Period Weeks    Status New  Target Date 06/24/21      PT LONG TERM GOAL #5   Title Patient will be able to perform all transfers without assist or supervision using LRAD in order to improve safety and independence    Time 8    Period Weeks    Status New    Target Date 06/24/21                 Plan - 05/09/21 1556    Clinical Impression Statement Patient continues to c/o back pain that "killing me", also c/o knee pain while performing hamstring stretch.  Today use of 5 min of hot pack before stretching utilized to improve tissue flexibility.  Stretch  with STM of corresponding muscle with slow progress secondary patient contracts for pain protection.  Patient will benefit from continued skilled PT with some change of focus, will need to find ways for patient to stretch hamstrings and hip flexors at home to progress with tissue length and ability to stand.  Patient appears to have multiple movable pea sized bodies under sknee near L lateral thigh and R med knee.    Personal Factors and Comorbidities Fitness;Past/Current Experience;Time since onset of injury/illness/exacerbation;Comorbidity 3+;Transportation           Patient will benefit from skilled therapeutic intervention in order to improve the following deficits and impairments:     Visit Diagnosis: Muscle weakness (generalized)  Other abnormalities of gait and mobility  Chronic bilateral low back pain, unspecified whether sciatica present  Pain in left hip  Pain in right hip  Chronic pain of left knee  Chronic pain of right knee     Problem List Patient Active Problem List   Diagnosis Date Noted  . Malnutrition of moderate degree 09/11/2020  . Loss of weight 09/07/2020  . Candidal intertrigo 09/07/2020  . Fall at home 09/06/2020  . Hypoglycemia 07/31/2020  . Purulent Cellulitis of left hip 07/15/2020  . Recurrent falls 07/15/2020  . 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 Physical deconditioning 07/15/2020  . Asymptomatic bacteriuria 07/15/2020  . Diabetes mellitus (HCC) 01/28/2012    Myrla Halsted, PT 05/09/2021, 4:08 PM  [facility] [address] [place], [place], [address] Phone: [phone]   Fax:  [phone]  Name: [name] MRN: [id number] Date of Birth: [birth year]

## 2021-05-13 ENCOUNTER — Ambulatory Visit: Payer: Medicare Other | Admitting: Physical Therapy

## 2021-05-13 ENCOUNTER — Encounter: Payer: Self-pay | Admitting: Physical Therapy

## 2021-05-13 ENCOUNTER — Other Ambulatory Visit: Payer: Self-pay

## 2021-05-13 DIAGNOSIS — M25552 Pain in left hip: Secondary | ICD-10-CM

## 2021-05-13 DIAGNOSIS — M25562 Pain in left knee: Secondary | ICD-10-CM

## 2021-05-13 DIAGNOSIS — R2689 Other abnormalities of gait and mobility: Secondary | ICD-10-CM

## 2021-05-13 DIAGNOSIS — M6281 Muscle weakness (generalized): Secondary | ICD-10-CM | POA: Diagnosis not present

## 2021-05-13 DIAGNOSIS — G8929 Other chronic pain: Secondary | ICD-10-CM

## 2021-05-13 DIAGNOSIS — M25551 Pain in right hip: Secondary | ICD-10-CM

## 2021-05-13 NOTE — Therapy (Signed)
[facility] Outpatient Rehabilitation [facility] [address] [place], [place], [address] Phone: [phone]   Fax:  [phone]  Physical Therapy Treatment  Patient Details  Name: [name] MRN: [id number] Date of Birth: June 05, 1956 Referring Provider (PT): Fleet Contras, MD   Encounter Date: 05/13/2021   PT End of Session - 05/13/21 1745    Visit Number 5    Number of Visits 16    Date for PT Re-Evaluation 06/24/21    Authorization Type UHC MCR    Authorization Time Period KX by 15th visit    Progress Note Due on Visit 10    PT Start Time 1445    PT Stop Time 1533    PT Time Calculation (min) 48 min    Equipment Utilized During Treatment Gait belt    Behavior During Therapy WFL for tasks assessed/performed           Past Medical History:  Diagnosis Date  . Cellulitis 07/17/2020   LEFT HIP  . Diabetes mellitus   . Hyperlipidemia   . Hypertension     Past Surgical History:  Procedure Laterality Date  . SPINE SURGERY      There were no vitals filed for this visit.   Subjective Assessment - 05/13/21 1501    Subjective "My back is killing me today."    Limitations Standing;Walking;House hold activities    Patient Stated Goals Patient reports he wants to walk again    Currently in Pain? Yes    Pain Score 7     Pain Location Back    Pain Orientation Lower    Pain Descriptors / Indicators Sharp    Pain Type Chronic pain              [facility] PT Assessment - 05/13/21 0001      Assessment   Medical Diagnosis Hip and knee pain    Referring Provider (PT) Fleet Contras, MD      Precautions   Precautions Fall    Precaution Comments Patient currently non-ambulatory using wheelchair for mobility and performing stand pivot transfers      Transfers   Stand Pivot Transfers 4: Min guard    Stand Pivot Transfer Details (indicate cue type and reason) Squat pivot with w/c ant to NuStep seat and to/from mat.                          [facility] Adult PT Treatment/Exercise - 05/13/21 0001      Transfers   Transfers Sit to Stand;Stand to Sit    Sit to Stand 4: Min guard;4: Min assist      Knee/Hip Exercises: Aerobic   Nustep L6 3 x 2-3 min with no rest break today, mild labored breathing.      Knee/Hip Exercises: Standing   Other Standing Knee Exercises Sit<>stand with 30 sec stand holds in parellel bars x 5   Verbal cues for scoot forward and use of arm rests.     Manual Therapy   Manual Therapy Soft tissue mobilization;Passive ROM    Soft tissue mobilization STM R/L hip flexor with sustained passive stretch in sidelying.    Passive ROM STM R/L hamstring during passive h/s stretch in sidelying. Attempt SKTC, patient with poor tolerance for supine.                  PT Education - 05/13/21 1743    Education Details Patient education to perform pressure relief every 15  to 20 min while sitting on bed or in chair, with c/o "my tail bone hurts".  Patient reports no wounds at this time. Patient encouraged to continue to try extending LEs while laying in bed.            PT Short Term Goals - 04/30/21 0811      PT SHORT TERM GOAL #1   Title Patient will be I with initial HEP to progress with PT    Time 4    Period Weeks    Status New    Target Date 05/27/21      PT SHORT TERM GOAL #2   Title Patient will be able to maintain standing position for >/= 10 seconds with min A and use of RW for assist to improve transfer ability    Time 4    Period Weeks    Status New    Target Date 05/27/21      PT SHORT TERM GOAL #3   Title Patient will be able to perform stand pivot transfer WC<>mat table using RW with supervision to improve safety with transfers at home    Time 4    Period Weeks    Status New    Target Date 05/27/21      PT SHORT TERM GOAL #4   Title Patient will exhibit improved knee strength >/= 4/5 MMT to improve stranding ability    Time 4    Period Weeks    Status  New    Target Date 05/27/21      PT SHORT TERM GOAL #5   Title Patient will report </= 5/10 pain level to reduce functional limitations    Time 4    Period Weeks    Status New    Target Date 05/27/21             PT Long Term Goals - 04/30/21 0816      PT LONG TERM GOAL #1   Title Patient will be I with final HEP to maintain progress from PT    Time 8    Period Weeks    Status New    Target Date 06/24/21      PT LONG TERM GOAL #2   Title Patient will be able to ambulate >/= 74ft using LRAD and supervision in order to improve household mobility    Time 8    Period Weeks    Status New    Target Date 06/24/21      PT LONG TERM GOAL #3   Title Patient will be able to stand >/= 1 minute with LRAD and supervision to improve homemaking ability    Time 8    Period Weeks    Status New    Target Date 06/24/21      PT LONG TERM GOAL #4   Title Patient will exhibit >/= 4+/5 MMT knee strength to improve ability to stand and walk    Time 8    Period Weeks    Status New    Target Date 06/24/21      PT LONG TERM GOAL #5   Title Patient will be able to perform all transfers without assist or supervision using LRAD in order to improve safety and independence    Time 8    Period Weeks    Status New    Target Date 06/24/21                 Plan - 05/13/21 1746  Clinical Impression Statement Patient continues to be quite limited with hip and knee extension causing knee and low back pain.  Attempts to perform passive stretches in order to allow erect standing posture and work on standing/walking have been getting worse instead of better.  Patient states he has not been taking his muscle relaxors as he thought they would be making hime weaker.  Long discussion about the use for muscle relaxors to decreased involentary tone could allow improved extension and that he needs to follow the doctors direction on medications as they may be addressing this issue.  Patient could benefit  from continued skilled PT and will continue to assess LE range as patient resumes use of perscribed medication.    Personal Factors and Comorbidities Fitness;Past/Current Experience;Time since onset of injury/illness/exacerbation;Comorbidity 3+;Transportation    Comorbidities DM, HTN, HLD, chronic pain syndrome    Examination-Activity Limitations Bathing;Locomotion Level;Transfers;Carry;Squat;Stairs;Dressing;Hygiene/Grooming;Lift;Toileting;Stand    Examination-Participation Restrictions Meal Prep;Cleaning;Community Activity;Driving;Shop;Laundry;Yard Work    PT Treatment/Interventions ADLs/Self Care Home Management;Aquatic Therapy;Cryotherapy;Electrical Stimulation;Iontophoresis 4mg /ml Dexamethasone;Moist Heat;DME Instruction;Neuromuscular re-education;Balance training;Therapeutic exercise;Therapeutic activities;Functional mobility training;Stair training;Gait training;Patient/family education;Manual techniques;Dry needling;Passive range of motion;Taping;Vasopneumatic Device;Spinal Manipulations;Joint Manipulations    PT Next Visit Plan Review HEP and progress PRN, progress LE ROM and core stability for LBP management, sit<>stand and transfer training in parallel bars    PT Home Exercise Plan [id number]    Consulted and Agree with Plan of Care Patient           Patient will benefit from skilled therapeutic intervention in order to improve the following deficits and impairments:  Difficulty walking,Decreased range of motion,Decreased activity tolerance,Pain,Decreased balance,Impaired flexibility,Postural dysfunction,Decreased strength,Decreased mobility,Decreased endurance  Visit Diagnosis: Muscle weakness (generalized)  Other abnormalities of gait and mobility  Chronic pain of right knee  Chronic pain of left knee  Chronic bilateral low back pain, unspecified whether sciatica present  Pain in left hip  Pain in right hip     Problem List Patient Active Problem List   Diagnosis Date  Noted  . Malnutrition of moderate degree 09/11/2020  . Loss of weight 09/07/2020  . Candidal intertrigo 09/07/2020  . Fall at home 09/06/2020  . Hypoglycemia 07/31/2020  . Purulent Cellulitis of left hip 07/15/2020  . Recurrent falls 07/15/2020  . 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 Physical deconditioning 07/15/2020  . Asymptomatic bacteriuria 07/15/2020  . Diabetes mellitus (HCC) 01/28/2012    03/27/2012, PT 05/13/2021, 5:52 PM  [facility] [address] [place], [place], [place] Phone: [phone]   Fax:  [phone]  Name: [name] MRN: [name] Date of Birth: 1956/08/21

## 2021-05-15 ENCOUNTER — Encounter: Payer: Self-pay | Admitting: Physical Therapy

## 2021-05-15 ENCOUNTER — Other Ambulatory Visit: Payer: Self-pay

## 2021-05-15 ENCOUNTER — Ambulatory Visit: Payer: Medicare Other | Admitting: Physical Therapy

## 2021-05-15 DIAGNOSIS — M25561 Pain in right knee: Secondary | ICD-10-CM

## 2021-05-15 DIAGNOSIS — M25551 Pain in right hip: Secondary | ICD-10-CM

## 2021-05-15 DIAGNOSIS — M6281 Muscle weakness (generalized): Secondary | ICD-10-CM

## 2021-05-15 DIAGNOSIS — R2689 Other abnormalities of gait and mobility: Secondary | ICD-10-CM

## 2021-05-15 DIAGNOSIS — G8929 Other chronic pain: Secondary | ICD-10-CM

## 2021-05-15 DIAGNOSIS — M25552 Pain in left hip: Secondary | ICD-10-CM

## 2021-05-15 NOTE — Therapy (Addendum)
[facility] [address] [place], [place], [address] Phone: [phone]   Fax:  [phone]  Physical Therapy Treatment  Patient Details  Name: [name] MRN: [id number] Date of Birth: [birth year] Referring Provider (PT): Fleet Contras, MD   Encounter Date: 05/15/2021   PT End of Session - 05/15/21 1605    Visit Number 6    Number of Visits 16    Date for PT Re-Evaluation 06/24/21    Authorization Type UHC MCR    Authorization Time Period KX by 15th visit    Progress Note Due on Visit 10    PT Start Time 1510    PT Stop Time 1600    PT Time Calculation (min) 50 min    Equipment Utilized During Treatment Gait belt    Activity Tolerance Patient limited by pain    Behavior During Therapy [facility] for tasks assessed/performed           Past Medical History:  Diagnosis Date  . Cellulitis 07/17/2020   LEFT HIP  . Diabetes mellitus   . Hyperlipidemia   . Hypertension     Past Surgical History:  Procedure Laterality Date  . SPINE SURGERY      There were no vitals filed for this visit.   Subjective Assessment - 05/15/21 1518    Subjective "My back stays the same all the time."  Patient reports waking up at 6 this morning because of the pain.    Limitations Standing;Walking;House hold activities    Currently in Pain? Yes    Pain Score 7     Pain Location Back    Pain Score 6    Pain Location Knee    Pain Orientation Right;Left              [facility] PT Assessment - 05/15/21 0001      Assessment   Medical Diagnosis Hip and knee pain    Referring Provider (PT) Fleet Contras, MD      Precautions   Precautions Fall    Precaution Comments Patient currently non-ambulatory using wheelchair for mobility and performing stand pivot transfers                         [facility] Adult PT Treatment/Exercise - 05/15/21 0001      Transfers   Transfers Sit to Stand;Stand to Sit    Sit to Stand 4: Min  guard;4: Database administrator Transfers 4: Min guard    Stand Pivot Transfer Details (indicate cue type and reason) minA with gait belt today      Knee/Hip Exercises: Stretches   Passive Hamstring Stretch 2 reps;30 seconds    Passive Hamstring Stretch Limitations seated on 6" step      Knee/Hip Exercises: Aerobic   Nustep L6 x6 min without rest break, fatigue after.      Knee/Hip Exercises: Seated   Long Arc Quad 2 sets;15 reps    Long Arc Quad [employer] --   3 lbs   Ball Squeeze 2x25    Clamshell with TheraBand Blue   x40   Marching 2 sets;20 reps;15 reps    Marching Limitations 3# AW    Hamstring Curl 2 sets;15 reps    Hamstring Limitations red band      Modalities   Modalities Moist Heat      Moist Heat Therapy   Number Minutes Moist Heat 5 Minutes    Moist Heat Location --  B hamstrings/quads     Manual Therapy   Manual Therapy Soft tissue mobilization;Passive ROM    Soft tissue mobilization STM R/L hip flexor with sustained passive stretch in sidelying.    Passive ROM STM R/L hamstring during passive h/s stretch in sidelying. Attempt SKTC, patient with poor tolerance for supine.                  PT Education - 05/15/21 1553    Education Details Continued education on use of heat for tissue flexibility.  Need to work on hip flexors as much as hamstring.    Person(s) Educated Patient    Methods Explanation;Demonstration    Comprehension Verbalized understanding;Returned demonstration;Verbal cues required            PT Short Term Goals - 04/30/21 0811      PT SHORT TERM GOAL #1   Title Patient will be I with initial HEP to progress with PT    Time 4    Period Weeks    Status New    Target Date 05/27/21      PT SHORT TERM GOAL #2   Title Patient will be able to maintain standing position for >/= 10 seconds with min A and use of RW for assist to improve transfer ability    Time 4    Period Weeks    Status New    Target Date 05/27/21      PT  SHORT TERM GOAL #3   Title Patient will be able to perform stand pivot transfer WC<>mat table using RW with supervision to improve safety with transfers at home    Time 4    Period Weeks    Status New    Target Date 05/27/21      PT SHORT TERM GOAL #4   Title Patient will exhibit improved knee strength >/= 4/5 MMT to improve stranding ability    Time 4    Period Weeks    Status New    Target Date 05/27/21      PT SHORT TERM GOAL #5   Title Patient will report </= 5/10 pain level to reduce functional limitations    Time 4    Period Weeks    Status New    Target Date 05/27/21             PT Long Term Goals - 04/30/21 0816      PT LONG TERM GOAL #1   Title Patient will be I with final HEP to maintain progress from PT    Time 8    Period Weeks    Status New    Target Date 06/24/21      PT LONG TERM GOAL #2   Title Patient will be able to ambulate >/= 11ft using LRAD and supervision in order to improve household mobility    Time 8    Period Weeks    Status New    Target Date 06/24/21      PT LONG TERM GOAL #3   Title Patient will be able to stand >/= 1 minute with LRAD and supervision to improve homemaking ability    Time 8    Period Weeks    Status New    Target Date 06/24/21      PT LONG TERM GOAL #4   Title Patient will exhibit >/= 4+/5 MMT knee strength to improve ability to stand and walk    Time 8    Period Weeks  Status New    Target Date 06/24/21      PT LONG TERM GOAL #5   Title Patient will be able to perform all transfers without assist or supervision using LRAD in order to improve safety and independence    Time 8    Period Weeks    Status New    Target Date 06/24/21                 Plan - 05/15/21 1606    Clinical Impression Statement Patient reports slight decrease in LBP from 7/10 to 6/10 which is encouraging for this patient as he usually stays at 7/10 before and after therapy.  Patient hamstring length also improved, L more than  R.  Patient will benefit form continued skilled PT to address LE muscle tone/tightness that prevent proper standing to allow gait training.    Personal Factors and Comorbidities Fitness;Past/Current Experience;Time since onset of injury/illness/exacerbation;Comorbidity 3+;Transportation    Comorbidities DM, HTN, HLD, chronic pain syndrome    Examination-Activity Limitations Bathing;Locomotion Level;Transfers;Carry;Squat;Stairs;Dressing;Hygiene/Grooming;Lift;Toileting;Stand    Stability/Clinical Decision Making Evolving/Moderate complexity    PT Treatment/Interventions ADLs/Self Care Home Management;Aquatic Therapy;Cryotherapy;Electrical Stimulation;Iontophoresis 4mg /ml Dexamethasone;Moist Heat;DME Instruction;Neuromuscular re-education;Balance training;Therapeutic exercise;Therapeutic activities;Functional mobility training;Stair training;Gait training;Patient/family education;Manual techniques;Dry needling;Passive range of motion;Taping;Vasopneumatic Device;Spinal Manipulations;Joint Manipulations    PT Next Visit Plan Review HEP and progress PRN, progress LE ROM and core stability for LBP management, sit<>stand and transfer training in parallel bars    PT Home Exercise Plan [id number]    Consulted and Agree with Plan of Care Patient           Patient will benefit from skilled therapeutic intervention in order to improve the following deficits and impairments:  Difficulty walking,Decreased range of motion,Decreased activity tolerance,Pain,Decreased balance,Impaired flexibility,Postural dysfunction,Decreased strength,Decreased mobility,Decreased endurance  Visit Diagnosis: Muscle weakness (generalized)  Other abnormalities of gait and mobility  Chronic pain of right knee  Chronic pain of left knee  Chronic bilateral low back pain, unspecified whether sciatica present  Pain in left hip  Pain in right hip     Problem List Patient Active Problem List   Diagnosis Date Noted  .  Malnutrition of moderate degree 09/11/2020  . Loss of weight 09/07/2020  . Candidal intertrigo 09/07/2020  . Fall at home 09/06/2020  . Hypoglycemia 07/31/2020  . Purulent Cellulitis of left hip 07/15/2020  . Recurrent falls 07/15/2020  . 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 Physical deconditioning 07/15/2020  . Asymptomatic bacteriuria 07/15/2020  . Diabetes mellitus (HCC) 01/28/2012    03/27/2012, PT 05/15/2021, 4:22 PM  [facility] [address] [place], [place], [place] Phone: [phone]   Fax:  [phone]  Name: [name] MRN: [name] Date of Birth: 25-Jun-1956

## 2021-05-21 ENCOUNTER — Ambulatory Visit: Payer: Medicare Other | Admitting: Physical Therapy

## 2021-05-21 ENCOUNTER — Other Ambulatory Visit: Payer: Self-pay

## 2021-05-21 ENCOUNTER — Encounter: Payer: Self-pay | Admitting: Physical Therapy

## 2021-05-21 DIAGNOSIS — M25551 Pain in right hip: Secondary | ICD-10-CM

## 2021-05-21 DIAGNOSIS — M25552 Pain in left hip: Secondary | ICD-10-CM

## 2021-05-21 DIAGNOSIS — M6281 Muscle weakness (generalized): Secondary | ICD-10-CM | POA: Diagnosis not present

## 2021-05-21 DIAGNOSIS — G8929 Other chronic pain: Secondary | ICD-10-CM

## 2021-05-21 DIAGNOSIS — R2689 Other abnormalities of gait and mobility: Secondary | ICD-10-CM

## 2021-05-21 DIAGNOSIS — M545 Low back pain, unspecified: Secondary | ICD-10-CM

## 2021-05-21 NOTE — Therapy (Signed)
[facility] [address] [place], [place], [address] Phone: [phone]   Fax:  [phone]  Physical Therapy Treatment  Patient Details  Name: [name] MRN: [id number] Date of Birth: 15-Nov-1956 Referring Provider (PT): Fleet Contras, MD   Encounter Date: 05/21/2021   PT End of Session - 05/21/21 1445    Visit Number 7    Number of Visits 16    Date for PT Re-Evaluation 06/24/21    Authorization Type UHC MCR    Authorization Time Period KX by 15th visit    Progress Note Due on Visit 10    PT Start Time 1445    PT Stop Time 1533    PT Time Calculation (min) 48 min    Activity Tolerance Patient limited by pain    Behavior During Therapy [facility] for tasks assessed/performed           Past Medical History:  Diagnosis Date  . Cellulitis 07/17/2020   LEFT HIP  . Diabetes mellitus   . Hyperlipidemia   . Hypertension     Past Surgical History:  Procedure Laterality Date  . SPINE SURGERY      There were no vitals filed for this visit.   Subjective Assessment - 05/21/21 1500    Subjective "I'd tell you something different if I could, but it's really the same, my back is killing me." Patient reports that the numbness in his low back and glutes when he first started PT is getting better.    Limitations Standing;Walking;House hold activities    Currently in Pain? Yes    Pain Score 7     Pain Location Back    Pain Orientation Lower    Pain Descriptors / Indicators Sharp    Pain Type Chronic pain    Pain Score 6    Pain Location Knee    Pain Orientation Left    Pain Descriptors / Indicators Aching    Pain Relieving Factors heat upper legs and back              [facility] PT Assessment - 05/21/21 0001      Assessment   Medical Diagnosis Hip and knee pain    Referring Provider (PT) Fleet Contras, MD    Next MD Visit 05/29/2021   Pain management MD.     Precautions   Precautions Fall    Precaution Comments  Patient currently non-ambulatory using wheelchair for mobility and performing stand pivot transfers                         [facility] Adult PT Treatment/Exercise - 05/21/21 0001      Transfers   Transfers Sit to Stand;Stand to Sit    Sit to Stand 4: Min guard;4: Min Risk analyst Transfers 4: Min guard    Stand Pivot Transfer Details (indicate cue type and reason) CGA with gait belt      Knee/Hip Exercises: Stretches   Passive Hamstring Stretch 2 reps;30 seconds      Knee/Hip Exercises: Aerobic   Nustep L6 x7 min without rest break, fatigue after.      Knee/Hip Exercises: Standing   Hip Flexion Limitations Weight shift 1r/L then mini marching in parallel bars    Other Standing Knee Exercises Sit<>stand with 30 sec-59min stand holds in parellel bars x 3      Modalities   Modalities Moist Heat      Moist Heat Therapy  Number Minutes Moist Heat 5 Minutes    Moist Heat Location --   B quad/hamstring     Manual Therapy   Manual Therapy Soft tissue mobilization;Passive ROM    Soft tissue mobilization STM R/L hip flexor with sustained passive stretch in sidelying.    Passive ROM STM R/L hamstring during passive h/s stretch in sidelying. Attempt SKTC, patient with poor tolerance for supine.                  PT Education - 05/21/21 1749    Education Details Continued education to use heat for tissue flexibility and encourage patient to try to roll "toward" abdoman after heating hip flexors to allow stretch.    Person(s) Educated Patient    Methods Explanation;Demonstration    Comprehension Verbalized understanding            PT Short Term Goals - 04/30/21 0811      PT SHORT TERM GOAL #1   Title Patient will be I with initial HEP to progress with PT    Time 4    Period Weeks    Status New    Target Date 05/27/21      PT SHORT TERM GOAL #2   Title Patient will be able to maintain standing position for >/= 10 seconds with min A and use of RW for  assist to improve transfer ability    Time 4    Period Weeks    Status New    Target Date 05/27/21      PT SHORT TERM GOAL #3   Title Patient will be able to perform stand pivot transfer WC<>mat table using RW with supervision to improve safety with transfers at home    Time 4    Period Weeks    Status New    Target Date 05/27/21      PT SHORT TERM GOAL #4   Title Patient will exhibit improved knee strength >/= 4/5 MMT to improve stranding ability    Time 4    Period Weeks    Status New    Target Date 05/27/21      PT SHORT TERM GOAL #5   Title Patient will report </= 5/10 pain level to reduce functional limitations    Time 4    Period Weeks    Status New    Target Date 05/27/21             PT Long Term Goals - 04/30/21 0816      PT LONG TERM GOAL #1   Title Patient will be I with final HEP to maintain progress from PT    Time 8    Period Weeks    Status New    Target Date 06/24/21      PT LONG TERM GOAL #2   Title Patient will be able to ambulate >/= 72ft using LRAD and supervision in order to improve household mobility    Time 8    Period Weeks    Status New    Target Date 06/24/21      PT LONG TERM GOAL #3   Title Patient will be able to stand >/= 1 minute with LRAD and supervision to improve homemaking ability    Time 8    Period Weeks    Status New    Target Date 06/24/21      PT LONG TERM GOAL #4   Title Patient will exhibit >/= 4+/5 MMT knee strength to improve ability  to stand and walk    Time 8    Period Weeks    Status New    Target Date 06/24/21      PT LONG TERM GOAL #5   Title Patient will be able to perform all transfers without assist or supervision using LRAD in order to improve safety and independence    Time 8    Period Weeks    Status New    Target Date 06/24/21                 Plan - 05/21/21 1751    Clinical Impression Statement Patient continues to improve with hamstring length but continues to me most limited with  tight hip flexors.  Patient able to stand at parallel bars and mini-march after weight shift with improved but flexed posture.  Patient also requires continued cues for use of w/c armrests for sit <> stand.  Patient will benefit from continued skilled PT to address deficits and maximize patient goal to mobilize out of the walker more functionally.    Comorbidities DM, HTN, HLD, chronic pain syndrome    Examination-Activity Limitations Bathing;Locomotion Level;Transfers;Carry;Squat;Stairs;Dressing;Hygiene/Grooming;Lift;Toileting;Stand    Examination-Participation Restrictions Meal Prep;Cleaning;Community Activity;Driving;Shop;Laundry;Yard Work    PT Treatment/Interventions ADLs/Self Care Home Management;Aquatic Therapy;Cryotherapy;Electrical Stimulation;Iontophoresis 4mg /ml Dexamethasone;Moist Heat;DME Instruction;Neuromuscular re-education;Balance training;Therapeutic exercise;Therapeutic activities;Functional mobility training;Stair training;Gait training;Patient/family education;Manual techniques;Dry needling;Passive range of motion;Taping;Vasopneumatic Device;Spinal Manipulations;Joint Manipulations    PT Next Visit Plan Review HEP and progress PRN, progress LE ROM and core stability for LBP management, sit<>stand and transfer training in parallel bars, trystepping with w/c follow.    PT Home Exercise Plan [id number]    Consulted and Agree with Plan of Care Patient           Patient will benefit from skilled therapeutic intervention in order to improve the following deficits and impairments:  Difficulty walking,Decreased range of motion,Decreased activity tolerance,Pain,Decreased balance,Impaired flexibility,Postural dysfunction,Decreased strength,Decreased mobility,Decreased endurance  Visit Diagnosis: Other abnormalities of gait and mobility  Pain in right hip  Muscle weakness (generalized)  Pain in left hip  Chronic pain of right knee  Chronic pain of left knee  Chronic bilateral low  back pain, unspecified whether sciatica present     Problem List Patient Active Problem List   Diagnosis Date Noted  . Malnutrition of moderate degree 09/11/2020  . Loss of weight 09/07/2020  . Candidal intertrigo 09/07/2020  . Fall at home 09/06/2020  . Hypoglycemia 07/31/2020  . Purulent Cellulitis of left hip 07/15/2020  . Recurrent falls 07/15/2020  . 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 Physical deconditioning 07/15/2020  . Asymptomatic bacteriuria 07/15/2020  . Diabetes mellitus (HCC) 01/28/2012    03/27/2012, PT 05/21/2021, 5:59 PM  [facility] [address] [place], [place], [place] Phone: [phone]   Fax:  [phone]  Name: [name] MRN: [name] Date of Birth: 1956-09-10

## 2021-05-23 ENCOUNTER — Other Ambulatory Visit: Payer: Self-pay

## 2021-05-23 ENCOUNTER — Ambulatory Visit: Payer: Medicare Other | Attending: Internal Medicine | Admitting: Physical Therapy

## 2021-05-23 ENCOUNTER — Encounter: Payer: Self-pay | Admitting: Physical Therapy

## 2021-05-23 DIAGNOSIS — R2689 Other abnormalities of gait and mobility: Secondary | ICD-10-CM | POA: Insufficient documentation

## 2021-05-23 DIAGNOSIS — M25561 Pain in right knee: Secondary | ICD-10-CM | POA: Diagnosis present

## 2021-05-23 DIAGNOSIS — M25552 Pain in left hip: Secondary | ICD-10-CM | POA: Diagnosis present

## 2021-05-23 DIAGNOSIS — M545 Low back pain, unspecified: Secondary | ICD-10-CM | POA: Insufficient documentation

## 2021-05-23 DIAGNOSIS — M25551 Pain in right hip: Secondary | ICD-10-CM | POA: Diagnosis present

## 2021-05-23 DIAGNOSIS — G8929 Other chronic pain: Secondary | ICD-10-CM | POA: Insufficient documentation

## 2021-05-23 DIAGNOSIS — M25562 Pain in left knee: Secondary | ICD-10-CM | POA: Insufficient documentation

## 2021-05-23 DIAGNOSIS — M6281 Muscle weakness (generalized): Secondary | ICD-10-CM | POA: Diagnosis present

## 2021-05-23 NOTE — Therapy (Signed)
[facility] [address] [place], [place], [address] Phone: [phone]   Fax:  [phone]  Physical Therapy Treatment  Patient Details  Name: [name] MRN: [id number] Date of Birth: 03-21-1956 Referring Provider (PT): Fleet Contras, MD   Encounter Date: 05/23/2021   PT End of Session - 05/23/21 1532    Visit Number 8    Number of Visits 16    Date for PT Re-Evaluation 06/24/21    Authorization Type UHC MCR    Authorization Time Period KX by 15th visit    Progress Note Due on Visit 10    PT Start Time 1533    PT Stop Time 1615    PT Time Calculation (min) 42 min    Activity Tolerance Patient limited by pain    Behavior During Therapy [facility] for tasks assessed/performed           Past Medical History:  Diagnosis Date  . Cellulitis 07/17/2020   LEFT HIP  . Diabetes mellitus   . Hyperlipidemia   . Hypertension     Past Surgical History:  Procedure Laterality Date  . SPINE SURGERY      There were no vitals filed for this visit.   Subjective Assessment - 05/23/21 1537    Subjective "My back and both knees are hurting today."    Limitations Standing;Walking;House hold activities    Patient Stated Goals Patient reports he wants to walk again    Currently in Pain? Yes    Pain Score 6     Pain Location Back    Pain Orientation Lower    Pain Descriptors / Indicators Sharp    Pain Score 6    Pain Location Knee    Pain Orientation Right;Left    Pain Descriptors / Indicators Aching;Lambert Mody              [facility] PT Assessment - 05/23/21 0001      Assessment   Medical Diagnosis Hip and knee pain    Referring Provider (PT) Fleet Contras, MD    Next MD Visit 05/29/2021      Precautions   Precautions Fall    Precaution Comments Patient now stepping away from the w/c with UE support.                         [facility] Adult PT Treatment/Exercise - 05/23/21 0001      Transfers   Stand Pivot  Transfers 4: Min guard    Stand Pivot Transfer Details (indicate cue type and reason) CGA with gait belt, v cues for hand placement and controlled pivot/descent.      Therapeutic Activites    Other Therapeutic Activities Supine to sit with verbal and visual cues for hand placement in order to sit without assist or use of "headborad" to pull.      Knee/Hip Exercises: Stretches   Passive Hamstring Stretch 2 reps;30 seconds    Passive Hamstring Stretch Limitations seated foot on 6" step      Knee/Hip Exercises: Aerobic   Nustep L6 x without rest break or R lean      Knee/Hip Exercises: Seated   Long Arc Quad 2 sets;15 reps    Long Arc Quad Weight 3 lbs.    Marching 2 sets;15 reps    Marching Weights 3 lbs.    Hamstring Curl 2 sets;15 reps    Hamstring Limitations BLUE band      Knee/Hip Exercises: Sidelying  Clams RED x10    Other Sidelying Knee/Hip Exercises Ball squeeze x10      Modalities   Modalities Moist Heat      Moist Heat Therapy   Number Minutes Moist Heat 5 Minutes    Moist Heat Location --   B quad and h/s during STM     Manual Therapy   Soft tissue mobilization STM R/L hip flexor with sustained passive stretch in sidelying.    Passive ROM STM R/L hamstring during passive h/s stretch in sidelying. Attempt SKTC, patient with poor tolerance for supine.                    PT Short Term Goals - 04/30/21 0811      PT SHORT TERM GOAL #1   Title Patient will be I with initial HEP to progress with PT    Time 4    Period Weeks    Status New    Target Date 05/27/21      PT SHORT TERM GOAL #2   Title Patient will be able to maintain standing position for >/= 10 seconds with min A and use of RW for assist to improve transfer ability    Time 4    Period Weeks    Status New    Target Date 05/27/21      PT SHORT TERM GOAL #3   Title Patient will be able to perform stand pivot transfer WC<>mat table using RW with supervision to improve safety with  transfers at home    Time 4    Period Weeks    Status New    Target Date 05/27/21      PT SHORT TERM GOAL #4   Title Patient will exhibit improved knee strength >/= 4/5 MMT to improve stranding ability    Time 4    Period Weeks    Status New    Target Date 05/27/21      PT SHORT TERM GOAL #5   Title Patient will report </= 5/10 pain level to reduce functional limitations    Time 4    Period Weeks    Status New    Target Date 05/27/21             PT Long Term Goals - 04/30/21 0816      PT LONG TERM GOAL #1   Title Patient will be I with final HEP to maintain progress from PT    Time 8    Period Weeks    Status New    Target Date 06/24/21      PT LONG TERM GOAL #2   Title Patient will be able to ambulate >/= 32ft using LRAD and supervision in order to improve household mobility    Time 8    Period Weeks    Status New    Target Date 06/24/21      PT LONG TERM GOAL #3   Title Patient will be able to stand >/= 1 minute with LRAD and supervision to improve homemaking ability    Time 8    Period Weeks    Status New    Target Date 06/24/21      PT LONG TERM GOAL #4   Title Patient will exhibit >/= 4+/5 MMT knee strength to improve ability to stand and walk    Time 8    Period Weeks    Status New    Target Date 06/24/21      PT  LONG TERM GOAL #5   Title Patient will be able to perform all transfers without assist or supervision using LRAD in order to improve safety and independence    Time 8    Period Weeks    Status New    Target Date 06/24/21                 Plan - 05/23/21 1539    Clinical Impression Statement Endurance improving, no longer leaning to left and taking rest breaks during NuStep. Patient treatment continues to be limited secondary poor hip extension quite short of full affecting standing tolerance and gait training.  Patient improving with some safety education such as use of arm rests and pivoting with control (without "plopping").  Patient able to perform supine to sit EOMat with CGA today reluctantly following cues for side-lying to sit with use of push in front of trunk instead of requiring assist with UE.  Patient will benefit from skilled PT to address deficits and maximize opportunity to mobilize with decreased pain and effort including patient goal to walk.    Comorbidities DM, HTN, HLD, chronic pain syndrome    Examination-Activity Limitations Bathing;Locomotion Level;Transfers;Carry;Squat;Stairs;Dressing;Hygiene/Grooming;Lift;Toileting;Stand    Examination-Participation Restrictions Meal Prep;Cleaning;Community Activity;Driving;Shop;Laundry;Yard Work    PT Treatment/Interventions ADLs/Self Care Home Management;Aquatic Therapy;Cryotherapy;Electrical Stimulation;Iontophoresis 4mg /ml Dexamethasone;Moist Heat;DME Instruction;Neuromuscular re-education;Balance training;Therapeutic exercise;Therapeutic activities;Functional mobility training;Stair training;Gait training;Patient/family education;Manual techniques;Dry needling;Passive range of motion;Taping;Vasopneumatic Device;Spinal Manipulations;Joint Manipulations    PT Next Visit Plan Review HEP and progress PRN, progress LE ROM and core stability for LBP management, sit<>stand and transfer training in parallel bars, try stepping with w/c follow. Assess STGs.    PT Home Exercise Plan [id number]    Consulted and Agree with Plan of Care Patient           Patient will benefit from skilled therapeutic intervention in order to improve the following deficits and impairments:  Difficulty walking,Decreased range of motion,Decreased activity tolerance,Pain,Decreased balance,Impaired flexibility,Postural dysfunction,Decreased strength,Decreased mobility,Decreased endurance  Visit Diagnosis: Other abnormalities of gait and mobility  Pain in right hip  Muscle weakness (generalized)  Pain in left hip  Chronic pain of right knee  Chronic pain of left knee  Chronic bilateral  low back pain, unspecified whether sciatica present     Problem List Patient Active Problem List   Diagnosis Date Noted  . Malnutrition of moderate degree 09/11/2020  . Loss of weight 09/07/2020  . Candidal intertrigo 09/07/2020  . Fall at home 09/06/2020  . Hypoglycemia 07/31/2020  . Purulent Cellulitis of left hip 07/15/2020  . Recurrent falls 07/15/2020  . 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 Physical deconditioning 07/15/2020  . Asymptomatic bacteriuria 07/15/2020  . Diabetes mellitus (HCC) 01/28/2012    03/27/2012, PT 05/23/2021, 4:59 PM  [facility] [address] [place], [place], [place] Phone: [phone]   Fax:  [phone]  Name: DERRIEN ANSCHUTZ MRN: [name] Date of Birth: Jan 08, 1956

## 2021-05-28 ENCOUNTER — Other Ambulatory Visit: Payer: Self-pay

## 2021-05-28 ENCOUNTER — Ambulatory Visit: Payer: Medicare Other | Admitting: Physical Therapy

## 2021-05-28 ENCOUNTER — Encounter: Payer: Self-pay | Admitting: Physical Therapy

## 2021-05-28 DIAGNOSIS — G8929 Other chronic pain: Secondary | ICD-10-CM

## 2021-05-28 DIAGNOSIS — M25551 Pain in right hip: Secondary | ICD-10-CM

## 2021-05-28 DIAGNOSIS — R2689 Other abnormalities of gait and mobility: Secondary | ICD-10-CM | POA: Diagnosis not present

## 2021-05-28 DIAGNOSIS — M25552 Pain in left hip: Secondary | ICD-10-CM

## 2021-05-28 DIAGNOSIS — M6281 Muscle weakness (generalized): Secondary | ICD-10-CM

## 2021-05-28 NOTE — Therapy (Signed)
[facility] Outpatient Rehabilitation [facility] [address] [place], [place], [address] Phone: [phone]   Fax:  [phone]  Physical Therapy Treatment   Progress Note Reporting Period 04/29/2021 to 05/28/2021  See note below for Objective Data and Assessment of Progress/Goals.    Patient Details  Name: [name] MRN: [id number] Date of Birth: [birth year] Referring Provider (PT): Fleet Contras, MD   Encounter Date: 05/28/2021   PT End of Session - 05/28/21 1455    Visit Number 9    Number of Visits 16    Date for PT Re-Evaluation 06/24/21    Authorization Type UHC MCR    Authorization Time Period KX by 15th visit    Progress Note Due on Visit 19   progress note performed on 9th visit   PT Start Time 1445    PT Stop Time 1535    PT Time Calculation (min) 50 min    Equipment Utilized During Treatment Gait belt    Activity Tolerance Patient limited by pain    Behavior During Therapy WFL for tasks assessed/performed           Past Medical History:  Diagnosis Date  . Cellulitis 07/17/2020   LEFT HIP  . Diabetes mellitus   . Hyperlipidemia   . Hypertension     Past Surgical History:  Procedure Laterality Date  . SPINE SURGERY      There were no vitals filed for this visit.   Subjective Assessment - 05/28/21 1453    Subjective Patient reports back and knees are killing him. He has been working on exercises at home.    Patient Stated Goals Patient reports he wants to walk again    Currently in Pain? Yes    Pain Score 5     Pain Location Back    Pain Orientation Lower    Pain Descriptors / Indicators Sharp    Pain Type Chronic pain    Pain Onset More than a month ago    Pain Frequency Constant    Pain Score 5    Pain Location Knee    Pain Orientation Right;Left    Pain Descriptors / Indicators Aching;Sharp    Pain Type Chronic pain    Pain Onset More than a month ago    Pain Frequency Intermittent              [facility] PT  Assessment - 05/28/21 0001      Assessment   Medical Diagnosis Hip and knee pain    Referring Provider (PT) Fleet Contras, MD      Precautions   Precautions Fall      Restrictions   Weight Bearing Restrictions No      Strength   Right Knee Flexion 4-/5    Right Knee Extension 4/5    Left Knee Flexion 4-/5    Left Knee Extension 4/5      Transfers   Transfers Supine to Sit;Sit to Stand;Stand Pivot Transfers    Sit to Stand 4: Min guard    Sit to Stand Details (indicate cue type and reason) CGA    Five time sit to stand comments  CGA with gait belt for safety, RW for balance when standing, cueing for proper hand placement while pushing up and reaching back to control decent    Stand Pivot Transfers 4: Min guard    Stand Pivot Transfer Details (indicate cue type and reason) CGA with gait belt for safety, RW for stability, patient required cues for  hand position to push up and reach back to control decent and foot placement for pivot    Supine to Sit 6: Modified independent (Device/Increase time)      Ambulation/Gait   Ambulation/Gait Yes    Ambulation/Gait Assistance 4: Min assist    Gait Comments Min A with gat belt for steadying and safety, RW with close wheelchair follow, approximately 5 steps x 2 sets, patient for flexed posturing due to back pain, uncoordinated leg movement and placement                         [facility] Adult PT Treatment/Exercise - 05/28/21 0001      Exercises   Exercises Knee/Hip      Knee/Hip Exercises: Stretches   Passive Hamstring Stretch 2 reps;30 seconds    Passive Hamstring Stretch Limitations seated foot on 6" step    Hip Flexor Stretch 2 reps;30 seconds    Hip Flexor Stretch Limitations PROM in sidelying      Knee/Hip Exercises: Aerobic   Nustep L6 x 5 min with UE and LE, single rest break required at 4:00      Knee/Hip Exercises: Standing   Gait Training Min A with gait belt using RW and close wheel chair follow, 2 x 5 steps,  cueing for proper foot placement and RW control      Knee/Hip Exercises: Seated   Long Arc Quad 20 reps    Marching 20 reps    Sit to [employer] 2 sets;5 reps;with UE support   20-30 sec hold while standing, 1st set in parallel bars, 2nd set with RW, mainly limited in tolerance due to back pain     Modalities   Modalities Moist Heat      Moist Heat Therapy   Number Minutes Moist Heat 10 Minutes   5 min with stretching, 5 post therapy   Moist Heat Location Lumbar Spine;Knee   MHP draped over hamstring, adductors, quads in combination with stretching                 PT Education - 05/28/21 1454    Education Details HEP    Person(s) Educated Patient    Methods Explanation    Comprehension Verbalized understanding;Need further instruction            PT Short Term Goals - 05/28/21 1526      PT SHORT TERM GOAL #1   Title Patient will be I with initial HEP to progress with PT    Baseline patient requires cues for HEP    Time 4    Period Weeks    Status On-going    Target Date 05/27/21      PT SHORT TERM GOAL #2   Title Patient will be able to maintain standing position for >/= 10 seconds with min A and use of RW for assist to improve transfer ability    Baseline Patient able to maintain standing using RW > 10 sec with CGA    Time 4    Period Weeks    Status Achieved    Target Date 05/27/21      PT SHORT TERM GOAL #3   Title Patient will be able to perform stand pivot transfer WC<>mat table using RW with supervision to improve safety with transfers at home    Baseline Patient requires CGA and frequent cues for stand pivot transfer using RW    Time 4    Period Weeks  Status On-going    Target Date 05/27/21      PT SHORT TERM GOAL #4   Title Patient will exhibit improved knee strength >/= 4/5 MMT to improve stranding ability    Baseline quads 4/5 MMT, hamstrings 4-/5 MMT    Time 4    Period Weeks    Status On-going    Target Date 05/27/21      PT SHORT TERM GOAL  #5   Title Patient will report </= 5/10 pain level to reduce functional limitations    Baseline Patient reports continued low back and knee pain, this visit 5/10 but worsens with activity    Time 4    Period Weeks    Status On-going    Target Date 05/27/21             PT Long Term Goals - 04/30/21 0816      PT LONG TERM GOAL #1   Title Patient will be I with final HEP to maintain progress from PT    Time 8    Period Weeks    Status New    Target Date 06/24/21      PT LONG TERM GOAL #2   Title Patient will be able to ambulate >/= 32ft using LRAD and supervision in order to improve household mobility    Time 8    Period Weeks    Status New    Target Date 06/24/21      PT LONG TERM GOAL #3   Title Patient will be able to stand >/= 1 minute with LRAD and supervision to improve homemaking ability    Time 8    Period Weeks    Status New    Target Date 06/24/21      PT LONG TERM GOAL #4   Title Patient will exhibit >/= 4+/5 MMT knee strength to improve ability to stand and walk    Time 8    Period Weeks    Status New    Target Date 06/24/21      PT LONG TERM GOAL #5   Title Patient will be able to perform all transfers without assist or supervision using LRAD in order to improve safety and independence    Time 8    Period Weeks    Status New    Target Date 06/24/21                 Plan - 05/28/21 1456    Clinical Impression Statement Patient continues to be limited in therapy due to increased low back pain that seems to limit endurance for standing and activity tolerance. He did exhibit much improved standing ability in parallel bars, only requiring CGA to maintain standing, and per patient approximately 25% assist with arms. Patient also able to perform sit<>stand from the chair with RW assist once standing CGA, and cues to push from arm rest and then reach back to arm rest in order to control decent. Patient able to take approximately 5 steps x 2 sets using RW  and wheelchair follow, with CGA / min A for stability. Continue with heat and stretching to improve hip mobility and reduce pain. Patient would benefit from continued skilled PT to progress mobility and strength and maximize opportunity to mobilize with decreased pain and effort including patient goal to walk.    PT Treatment/Interventions ADLs/Self Care Home Management;Aquatic Therapy;Cryotherapy;Electrical Stimulation;Iontophoresis 4mg /ml Dexamethasone;Moist Heat;DME Instruction;Neuromuscular re-education;Balance training;Therapeutic exercise;Therapeutic activities;Functional mobility training;Stair training;Gait training;Patient/family education;Manual techniques;Dry needling;Passive range of motion;Taping;Vasopneumatic  Device;Spinal Manipulations;Joint Manipulations    PT Next Visit Plan Review HEP and progress PRN, progress LE ROM and core stability for LBP management, sit<>stand and transfer training in parallel bars/RW, gait training with RW and w/c follow    PT Home Exercise Plan [id number]    Consulted and Agree with Plan of Care Patient           Patient will benefit from skilled therapeutic intervention in order to improve the following deficits and impairments:  Difficulty walking,Decreased range of motion,Decreased activity tolerance,Pain,Decreased balance,Impaired flexibility,Postural dysfunction,Decreased strength,Decreased mobility,Decreased endurance  Visit Diagnosis: Other abnormalities of gait and mobility  Muscle weakness (generalized)  Pain in right hip  Pain in left hip  Chronic pain of right knee  Chronic pain of left knee  Chronic bilateral low back pain, unspecified whether sciatica present     Problem List Patient Active Problem List   Diagnosis Date Noted  . Malnutrition of moderate degree 09/11/2020  . Loss of weight 09/07/2020  . Candidal intertrigo 09/07/2020  . Fall at home 09/06/2020  . Hypoglycemia 07/31/2020  . Purulent Cellulitis of left hip  07/15/2020  . Recurrent falls 07/15/2020  . 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 Physical deconditioning 07/15/2020  . Asymptomatic bacteriuria 07/15/2020  . Diabetes mellitus (HCC) 01/28/2012    Rosana Hoesampbell Travion Ke, PT, DPT, LAT, ATC 05/28/21  3:52 PM Phone: [phone] Fax: [phone]   [facility] [address] [place], [place], [address] Phone: [phone]   Fax:  [phone]  Name: [name] MRN: [id number] Date of Birth: 1956/04/28

## 2021-05-30 ENCOUNTER — Ambulatory Visit: Payer: Medicare Other | Admitting: Physical Therapy

## 2021-06-03 ENCOUNTER — Encounter: Payer: Self-pay | Admitting: Physical Therapy

## 2021-06-03 ENCOUNTER — Other Ambulatory Visit: Payer: Self-pay

## 2021-06-03 ENCOUNTER — Ambulatory Visit: Payer: Medicare Other | Admitting: Physical Therapy

## 2021-06-03 DIAGNOSIS — M25561 Pain in right knee: Secondary | ICD-10-CM

## 2021-06-03 DIAGNOSIS — R2689 Other abnormalities of gait and mobility: Secondary | ICD-10-CM | POA: Diagnosis not present

## 2021-06-03 DIAGNOSIS — G8929 Other chronic pain: Secondary | ICD-10-CM

## 2021-06-03 DIAGNOSIS — M25551 Pain in right hip: Secondary | ICD-10-CM

## 2021-06-03 DIAGNOSIS — M25552 Pain in left hip: Secondary | ICD-10-CM

## 2021-06-03 DIAGNOSIS — M6281 Muscle weakness (generalized): Secondary | ICD-10-CM

## 2021-06-03 NOTE — Therapy (Signed)
[facility] [address] [place], [place], [address] Phone: [phone]   Fax:  [phone]  Physical Therapy Treatment  Patient Details  Name: [name] MRN: [id number] Date of Birth: 09-Jun-1956 Referring Provider (PT): Fleet Contras, MD   Encounter Date: 06/03/2021   PT End of Session - 06/03/21 1626     Visit Number 10    Number of Visits 16    Date for PT Re-Evaluation 06/24/21    Authorization Type UHC MCR    Authorization Time Period KX by 15th visit    Progress Note Due on Visit 19    PT Start Time 1615    PT Stop Time 1700    PT Time Calculation (min) 45 min    Equipment Utilized During Treatment Gait belt    Activity Tolerance Patient limited by pain    Behavior During Therapy [facility] for tasks assessed/performed             Past Medical History:  Diagnosis Date   Cellulitis 07/17/2020   LEFT HIP   Diabetes mellitus    Hyperlipidemia    Hypertension     Past Surgical History:  Procedure Laterality Date   SPINE SURGERY      There were no vitals filed for this visit.   Subjective Assessment - 06/03/21 1622     Subjective Patient reports back and the back of the knees are "killin' me" after rolling over in bed last night.    Limitations Standing;Walking;House hold activities    Patient Stated Goals Patient reports he wants to walk again    Currently in Pain? Yes    Pain Score 7     Pain Location Back    Pain Orientation Lower    Pain Descriptors / Indicators Sharp    Pain Score 6    Pain Location Knee    Pain Orientation Right                [facility] PT Assessment - 06/03/21 0001       Assessment   Medical Diagnosis Hip and knee pain    Referring Provider (PT) Fleet Contras, MD      Precautions   Precautions [facility] Adult PT Treatment/Exercise - 06/03/21 0001       Therapeutic Activites    Other Therapeutic Activities Supine to  sit with verbal and visual cues for hand placement in order to sit without assist or use of "headborad" to pull.      Knee/Hip Exercises: Stretches   Passive Hamstring Stretch 2 reps;30 seconds    Hip Flexor Stretch 2 reps;30 seconds    Hip Flexor Stretch Limitations PROM in sidelying      Knee/Hip Exercises: Aerobic   Nustep L6 x 5 min with UE and LE, single rest break required at 4:00   3 rest breaks secondary pain     Knee/Hip Exercises: Seated   Clamshell with TheraBand Red   x20     Knee/Hip Exercises: Sidelying   Clams no band today, L only      Modalities   Modalities Moist Heat      Moist Heat Therapy   Number Minutes Moist Heat 10 Minutes    Moist Heat Location Lumbar Spine;Knee   During passive stretches of h/s and hip flexors     Manual  Therapy   Soft tissue mobilization STM R/L hip flexor with sustained passive stretch in sidelying.    Passive ROM STM R/L hamstring during passive h/s stretch in sidelying. Attempt SKTC, patient with poor tolerance for supine.                    PT Education - 06/03/21 1625     Education Details Continue with HEP and trying to stretch the hip flexors.    Methods Explanation;Demonstration    Comprehension Verbalized understanding              PT Short Term Goals - 05/28/21 1526       PT SHORT TERM GOAL #1   Title Patient will be I with initial HEP to progress with PT    Baseline patient requires cues for HEP    Time 4    Period Weeks    Status On-going    Target Date 05/27/21      PT SHORT TERM GOAL #2   Title Patient will be able to maintain standing position for >/= 10 seconds with min A and use of RW for assist to improve transfer ability    Baseline Patient able to maintain standing using RW > 10 sec with CGA    Time 4    Period Weeks    Status Achieved    Target Date 05/27/21      PT SHORT TERM GOAL #3   Title Patient will be able to perform stand pivot transfer WC<>mat table using RW with  supervision to improve safety with transfers at home    Baseline Patient requires CGA and frequent cues for stand pivot transfer using RW    Time 4    Period Weeks    Status On-going    Target Date 05/27/21      PT SHORT TERM GOAL #4   Title Patient will exhibit improved knee strength >/= 4/5 MMT to improve stranding ability    Baseline quads 4/5 MMT, hamstrings 4-/5 MMT    Time 4    Period Weeks    Status On-going    Target Date 05/27/21      PT SHORT TERM GOAL #5   Title Patient will report </= 5/10 pain level to reduce functional limitations    Baseline Patient reports continued low back and knee pain, this visit 5/10 but worsens with activity    Time 4    Period Weeks    Status On-going    Target Date 05/27/21               PT Long Term Goals - 04/30/21 0816       PT LONG TERM GOAL #1   Title Patient will be I with final HEP to maintain progress from PT    Time 8    Period Weeks    Status New    Target Date 06/24/21      PT LONG TERM GOAL #2   Title Patient will be able to ambulate >/= 15ft using LRAD and supervision in order to improve household mobility    Time 8    Period Weeks    Status New    Target Date 06/24/21      PT LONG TERM GOAL #3   Title Patient will be able to stand >/= 1 minute with LRAD and supervision to improve homemaking ability    Time 8    Period Weeks    Status New  Target Date 06/24/21      PT LONG TERM GOAL #4   Title Patient will exhibit >/= 4+/5 MMT knee strength to improve ability to stand and walk    Time 8    Period Weeks    Status New    Target Date 06/24/21      PT LONG TERM GOAL #5   Title Patient will be able to perform all transfers without assist or supervision using LRAD in order to improve safety and independence    Time 8    Period Weeks    Status New    Target Date 06/24/21                   Plan - 06/03/21 1627     Clinical Impression Statement Patient continues to be limited in therap  secondary back and knee pain and decreased endurance as well as decreased LE flexibility.  Patient had a hard time today with functional mobility because pain was particularly high today.  initiated therapy after warm up on the NuStep with hot packs to quad and h/s while performing passive stretches which seemed to help somewhat; however, when transferring back to w/c, patient realized he was in too much pain to perform effective walking in parallel bars. Patient contiues to be challenge particularly with hip extention tone.  Patient will benefit from skilled PT to address deficits and maximize functional mobility.    Personal Factors and Comorbidities Fitness;Past/Current Experience;Time since onset of injury/illness/exacerbation;Comorbidity 3+;Transportation    Examination-Activity Limitations Bathing;Locomotion Level;Transfers;Carry;Squat;Stairs;Dressing;Hygiene/Grooming;Lift;Toileting;Stand    Examination-Participation Restrictions Meal Prep;Cleaning;Community Activity;Driving;Shop;Laundry;Yard Work    PT Treatment/Interventions ADLs/Self Care Home Management;Aquatic Therapy;Cryotherapy;Electrical Stimulation;Iontophoresis 4mg /ml Dexamethasone;Moist Heat;DME Instruction;Neuromuscular re-education;Balance training;Therapeutic exercise;Therapeutic activities;Functional mobility training;Stair training;Gait training;Patient/family education;Manual techniques;Dry needling;Passive range of motion;Taping;Vasopneumatic Device;Spinal Manipulations;Joint Manipulations    PT Next Visit Plan Review HEP and progress PRN, progress LE ROM and core stability for LBP management, sit<>stand and transfer training in parallel bars/RW, gait training with RW and w/c follow    PT Home Exercise Plan [email or web address]    Consulted and Agree with Plan of Care Patient             Patient will benefit from skilled therapeutic intervention in order to improve the following deficits and impairments:  Difficulty walking, Decreased  range of motion, Decreased activity tolerance, Pain, Decreased balance, Impaired flexibility, Postural dysfunction, Decreased strength, Decreased mobility, Decreased endurance  Visit Diagnosis: Chronic pain of left knee  Muscle weakness (generalized)  Chronic bilateral low back pain, unspecified whether sciatica present  Pain in right hip  Pain in left hip  Chronic pain of right knee     Problem List Patient Active Problem List   Diagnosis Date Noted   Malnutrition of moderate degree 09/11/2020   Loss of weight 09/07/2020   Candidal intertrigo 09/07/2020   Fall at home 09/06/2020   Hypoglycemia 07/31/2020   Purulent Cellulitis of left hip 07/15/2020   Recurrent falls 07/15/2020   Frailty syndrome in geriatric patient 07/15/2020   Physical deconditioning 07/15/2020   Asymptomatic bacteriuria 07/15/2020   Diabetes mellitus (HCC) 01/28/2012    03/27/2012, PT 06/03/2021, 5:13 PM  [facility] [address] [place], [place], [place] Phone: [phone]   Fax:  [phone]  Name: [name] MRN: [name] Date of Birth: [birth year]

## 2021-06-05 ENCOUNTER — Ambulatory Visit: Payer: Medicare Other | Admitting: Physical Therapy

## 2021-06-05 ENCOUNTER — Encounter: Payer: Self-pay | Admitting: Physical Therapy

## 2021-06-05 ENCOUNTER — Other Ambulatory Visit: Payer: Self-pay

## 2021-06-05 DIAGNOSIS — G8929 Other chronic pain: Secondary | ICD-10-CM

## 2021-06-05 DIAGNOSIS — M25551 Pain in right hip: Secondary | ICD-10-CM

## 2021-06-05 DIAGNOSIS — M25561 Pain in right knee: Secondary | ICD-10-CM

## 2021-06-05 DIAGNOSIS — R2689 Other abnormalities of gait and mobility: Secondary | ICD-10-CM

## 2021-06-05 DIAGNOSIS — M6281 Muscle weakness (generalized): Secondary | ICD-10-CM

## 2021-06-05 DIAGNOSIS — M25552 Pain in left hip: Secondary | ICD-10-CM

## 2021-06-05 NOTE — Therapy (Signed)
[facility] [address] [place], [place], [address] Phone: [phone]   Fax:  [phone]  Physical Therapy Treatment  Patient Details  Name: [name] MRN: [id number] Date of Birth: 05-06-1956 Referring Provider (PT): Fleet Contras, MD   Encounter Date: 06/05/2021   PT End of Session - 06/05/21 1544     Visit Number 11    Number of Visits 16    Date for PT Re-Evaluation 06/24/21    Authorization Type UHC MCR    Authorization Time Period KX by 15th visit    Progress Note Due on Visit 19    PT Start Time 1533    PT Stop Time 1615    PT Time Calculation (min) 42 min    Equipment Utilized During Treatment Gait belt    Activity Tolerance Patient limited by pain    Behavior During Therapy [facility] for tasks assessed/performed             Past Medical History:  Diagnosis Date   Cellulitis 07/17/2020   LEFT HIP   Diabetes mellitus    Hyperlipidemia    Hypertension     Past Surgical History:  Procedure Laterality Date   SPINE SURGERY      There were no vitals filed for this visit.   Subjective Assessment - 06/05/21 1541     Subjective "I'm doing better, a 6/10 today."    Limitations Standing;Walking;House hold activities    Currently in Pain? Yes    Pain Score 6     Pain Location Back    Pain Orientation Lower    Pain Score 6    Pain Location Knee    Pain Orientation Right    Pain Descriptors / Indicators Aching;Lambert Mody                [facility] PT Assessment - 06/05/21 0001       Assessment   Medical Diagnosis Hip and knee pain    Referring Provider (PT) Fleet Contras, MD      Precautions   Precautions Fall      Restrictions   Weight Bearing Restrictions No                           [facility] Adult PT Treatment/Exercise - 06/05/21 0001       Transfers   Transfers Supine to Sit;Sit to Stand;Stand Pivot Transfers    Sit to Stand 4: Min guard    Five time sit to stand  comments  CGA with gait belt for safety, RW for balance when standing, cueing for proper hand placement while pushing up and reaching back to control decent    Stand Pivot Transfers 4: Min guard    Stand Pivot Transfer Details (indicate cue type and reason) CGA with gait belt and verbal cues for controlled descent.    Supine to Sit 6: Modified independent (Device/Increase time)      Ambulation/Gait   Gait Comments CGA with gat belt for steadying and safety, RW with close wheelchair follow, approximately 8-10' x 6 sets, patient for flexed posturing due to back pain, uncoordinated leg movement and placement      Therapeutic Activites    Other Therapeutic Activities Supine to sit with verbal and visual cues for hand placement in order to sit without assist or use of "headborad" to pull.      Knee/Hip Exercises: Stretches   Other Knee/Hip Stretches Supine 1/2 hooklying, opposite LE extended  to provide gravity assisted hip/knee ext stretch.      Knee/Hip Exercises: Aerobic   Nustep L7 x 5 min with UE and LE, single rest break required at 4:00   2 30sec rest break at and     Knee/Hip Exercises: Seated   Long Arc Quad 20 reps    Long Arc Quad Weight 3 lbs.   R=3#, L=2.5#   Ball Squeeze 2x25    Clamshell with TheraBand Red    Other Seated Knee/Hip Exercises Heel-toe raises 2 x 20    Marching 10 reps;2 sets    Marching Limitations 3#AW                    PT Education - 06/05/21 1543     Education Details Patient encouraged to continue stretching hip flexors with heat to make more flexible    Person(s) Educated Patient    Methods Explanation;Demonstration    Comprehension Verbalized understanding              PT Short Term Goals - 05/28/21 1526       PT SHORT TERM GOAL #1   Title Patient will be I with initial HEP to progress with PT    Baseline patient requires cues for HEP    Time 4    Period Weeks    Status On-going    Target Date 05/27/21      PT  SHORT TERM GOAL #2   Title Patient will be able to maintain standing position for >/= 10 seconds with min A and use of RW for assist to improve transfer ability    Baseline Patient able to maintain standing using RW > 10 sec with CGA    Time 4    Period Weeks    Status Achieved    Target Date 05/27/21      PT SHORT TERM GOAL #3   Title Patient will be able to perform stand pivot transfer WC<>mat table using RW with supervision to improve safety with transfers at home    Baseline Patient requires CGA and frequent cues for stand pivot transfer using RW    Time 4    Period Weeks    Status On-going    Target Date 05/27/21      PT SHORT TERM GOAL #4   Title Patient will exhibit improved knee strength >/= 4/5 MMT to improve stranding ability    Baseline quads 4/5 MMT, hamstrings 4-/5 MMT    Time 4    Period Weeks    Status On-going    Target Date 05/27/21      PT SHORT TERM GOAL #5   Title Patient will report </= 5/10 pain level to reduce functional limitations    Baseline Patient reports continued low back and knee pain, this visit 5/10 but worsens with activity    Time 4    Period Weeks    Status On-going    Target Date 05/27/21               PT Long Term Goals - 04/30/21 0816       PT LONG TERM GOAL #1   Title Patient will be I with final HEP to maintain progress from PT    Time 8    Period Weeks    Status New    Target Date 06/24/21      PT LONG TERM GOAL #2   Title Patient will be able to ambulate >/= 42ft using  LRAD and supervision in order to improve household mobility    Time 8    Period Weeks    Status New    Target Date 06/24/21      PT LONG TERM GOAL #3   Title Patient will be able to stand >/= 1 minute with LRAD and supervision to improve homemaking ability    Time 8    Period Weeks    Status New    Target Date 06/24/21      PT LONG TERM GOAL #4   Title Patient will exhibit >/= 4+/5 MMT knee strength to improve ability to stand and walk    Time 8     Period Weeks    Status New    Target Date 06/24/21      PT LONG TERM GOAL #5   Title Patient will be able to perform all transfers without assist or supervision using LRAD in order to improve safety and independence    Time 8    Period Weeks    Status New    Target Date 06/24/21                   Plan - 06/05/21 1547     Clinical Impression Statement Patient made progress today with walking in the parallel bars, decreased pain. Able to perform 3 laps with significant UE support and chair follow, rest breaks at each end, improving with safety waiting to sit with multiple verbal cues. Patien tcontinued to be challenged by hip flexor tone and tissue tightness limiting ability to stand erect.  Patient will benefit from skilled PT to address deficits and maximize functional mobility.    Comorbidities DM, HTN, HLD, chronic pain syndrome    Examination-Activity Limitations Bathing;Locomotion Level;Transfers;Carry;Squat;Stairs;Dressing;Hygiene/Grooming;Lift;Toileting;Stand    PT Treatment/Interventions ADLs/Self Care Home Management;Aquatic Therapy;Cryotherapy;Electrical Stimulation;Iontophoresis 4mg /ml Dexamethasone;Moist Heat;DME Instruction;Neuromuscular re-education;Balance training;Therapeutic exercise;Therapeutic activities;Functional mobility training;Stair training;Gait training;Patient/family education;Manual techniques;Dry needling;Passive range of motion;Taping;Vasopneumatic Device;Spinal Manipulations;Joint Manipulations    PT Next Visit Plan Review HEP and progress PRN, progress LE ROM and core stability for LBP management, sit<>stand and transfer training in parallel bars/RW, gait training with RW and w/c follow    PT Home Exercise Plan [email or web address]    Consulted and Agree with Plan of Care Patient             Patient will benefit from skilled therapeutic intervention in order to improve the following deficits and impairments:  Difficulty walking, Decreased range of motion,  Decreased activity tolerance, Pain, Decreased balance, Impaired flexibility, Postural dysfunction, Decreased strength, Decreased mobility, Decreased endurance  Visit Diagnosis: Chronic pain of left knee  Muscle weakness (generalized)  Chronic bilateral low back pain, unspecified whether sciatica present  Pain in right hip  Pain in left hip  Chronic pain of right knee  Other abnormalities of gait and mobility     Problem List Patient Active Problem List   Diagnosis Date Noted   Malnutrition of moderate degree 09/11/2020   Loss of weight 09/07/2020   Candidal intertrigo 09/07/2020   Fall at home 09/06/2020   Hypoglycemia 07/31/2020   Purulent Cellulitis of left hip 07/15/2020   Recurrent falls 07/15/2020   Frailty syndrome in geriatric patient 07/15/2020   Physical deconditioning 07/15/2020   Asymptomatic bacteriuria 07/15/2020   Diabetes mellitus (HCC) 01/28/2012    Myrla Halstedenise  Warren Kugelman, PT 06/05/2021, 7:08 PM  [facility] [address] [place], [place], [address] Phone: [phone]   Fax:  [phone]  Name: [name] MRN: [id number] Date of Birth: [birth year]

## 2021-06-11 ENCOUNTER — Ambulatory Visit: Payer: Medicare Other | Admitting: Physical Therapy

## 2021-06-11 ENCOUNTER — Encounter: Payer: Self-pay | Admitting: Physical Therapy

## 2021-06-11 ENCOUNTER — Other Ambulatory Visit: Payer: Self-pay

## 2021-06-11 DIAGNOSIS — M25561 Pain in right knee: Secondary | ICD-10-CM

## 2021-06-11 DIAGNOSIS — M6281 Muscle weakness (generalized): Secondary | ICD-10-CM

## 2021-06-11 DIAGNOSIS — G8929 Other chronic pain: Secondary | ICD-10-CM

## 2021-06-11 DIAGNOSIS — M545 Low back pain, unspecified: Secondary | ICD-10-CM

## 2021-06-11 DIAGNOSIS — M25562 Pain in left knee: Secondary | ICD-10-CM

## 2021-06-11 DIAGNOSIS — R2689 Other abnormalities of gait and mobility: Secondary | ICD-10-CM | POA: Diagnosis not present

## 2021-06-11 DIAGNOSIS — M25552 Pain in left hip: Secondary | ICD-10-CM

## 2021-06-11 DIAGNOSIS — M25551 Pain in right hip: Secondary | ICD-10-CM

## 2021-06-11 NOTE — Patient Instructions (Signed)
Access Code: [email or web address] URL: [email or web address] Date: 06/11/2021 Prepared by: Myrla Halsted  NEW Exercises  Seated Hamstring Stretch with Chair - 1 x daily - 7 x weekly - 3 sets - 20sec hold

## 2021-06-11 NOTE — Therapy (Signed)
[facility] [address] [place], [place], [address] Phone: [phone]   Fax:  [phone]  Physical Therapy Treatment  Patient Details  Name: Walter ManeRoger B Kane MRN: [id number] Date of Birth: 1956/02/19 Referring Provider (PT): Fleet ContrasAvbuere, Edwin, MD   Encounter Date: 06/11/2021   PT End of Session - 06/11/21 1905     Visit Number 12    Number of Visits 16    Date for PT Re-Evaluation 06/24/21    Authorization Type UHC MCR    Authorization Time Period KX by 15th visit    Progress Note Due on Visit 19    PT Start Time 1705    PT Stop Time 1745    PT Time Calculation (min) 40 min    Equipment Utilized During Treatment Gait belt    Activity Tolerance Patient limited by pain;No increased pain    Behavior [facility] tasks assessed/performed             Past Medical History:  Diagnosis Date   Cellulitis 07/17/2020   LEFT HIP   Diabetes mellitus    Hyperlipidemia    Hypertension     Past Surgical History:  Procedure Laterality Date   SPINE SURGERY      There were no vitals filed for this visit.   Subjective Assessment - 06/11/21 1902     Subjective "I'm at a 5 today".    Limitations Standing;Walking;House hold activities    Currently in Pain? Yes    Pain Score 5     Pain Location Back    Pain Orientation Lower    Pain Descriptors / Indicators Sharp;Dull    Pain Type Chronic pain    Pain Score 4    Pain Location Knee    Pain Orientation Right    Pain Descriptors / Indicators Sharp    Pain Type Chronic pain   Increases with weight bearing.               [facility] - 06/11/21 0001       Assessment   Medical Diagnosis Hip and knee pain    Referring Provider (PT) Fleet ContrasAvbuere, Edwin, MD      Precautions   Precautions Fall      Restrictions   Weight Bearing Restrictions No                           [facility] Adult PT Treatment/Exercise - 06/11/21 0001        Transfers   Stand Pivot Transfers 4: Min guard    Stand Pivot Transfer Details (indicate cue type and reason) CGA with gait belt and verbal cues for controlled descent.    Supine to Sit 6: Modified independent (Device/Increase time)      Ambulation/Gait   Ambulation/Gait Yes    Ambulation/Gait Assistance 4: Min guard    Gait Comments CGA with gait belt for steadying and safety, RW with close wheelchair follow, approximately 8-10' x 4 sets, patient for flexed posturing due to back pain, uncoordinated leg movement and placement      Lumbar Exercises: Seated   Other Seated Lumbar Exercises Ball roll out 3sec hold x 10    Other Seated Lumbar Exercises Black Tball held behind back with lumbar/thor extension over ball.      Knee/Hip Exercises: Stretches   Passive Hamstring Stretch 2 reps;30 seconds      Knee/Hip Exercises: Aerobic   Nustep L7 x 5 min  with UE and LE, single rest break required at 4:00      Knee/Hip Exercises: Seated   Long Arc Quad 20 reps    Long Arc Quad Weight 3 lbs.    Long Arc Quad Limitations with ball squeeze    [employer] 40    Clamshell with [employer] 10 reps;2 sets    Marching Limitations 3#AW                    PT Education - 06/11/21 1904     Education Details Patient education for slow controlled movements during strengthening exercises.  Added seated hamstring stretch to HEP.    Person(s) Educated Patient    Methods Explanation;Demonstration;Verbal cues;Handout    Comprehension Verbalized understanding;Returned demonstration              PT Short Term Goals - 05/28/21 1526       PT SHORT TERM GOAL #1   Title Patient will be I with initial HEP to progress with PT    Baseline patient requires cues for HEP    Time 4    Period Weeks    Status On-going    Target Date 05/27/21      PT SHORT TERM GOAL #2   Title Patient will be able to maintain standing position for >/= 10 seconds with min A and use of RW for assist  to improve transfer ability    Baseline Patient able to maintain standing using RW > 10 sec with CGA    Time 4    Period Weeks    Status Achieved    Target Date 05/27/21      PT SHORT TERM GOAL #3   Title Patient will be able to perform stand pivot transfer WC<>mat table using RW with supervision to improve safety with transfers at home    Baseline Patient requires CGA and frequent cues for stand pivot transfer using RW    Time 4    Period Weeks    Status On-going    Target Date 05/27/21      PT SHORT TERM GOAL #4   Title Patient will exhibit improved knee strength >/= 4/5 MMT to improve stranding ability    Baseline quads 4/5 MMT, hamstrings 4-/5 MMT    Time 4    Period Weeks    Status On-going    Target Date 05/27/21      PT SHORT TERM GOAL #5   Title Patient will report </= 5/10 pain level to reduce functional limitations    Baseline Patient reports continued low back and knee pain, this visit 5/10 but worsens with activity    Time 4    Period Weeks    Status On-going    Target Date 05/27/21               PT Long Term Goals - 04/30/21 0816       PT LONG TERM GOAL #1   Title Patient will be I with final HEP to maintain progress from PT    Time 8    Period Weeks    Status New    Target Date 06/24/21      PT LONG TERM GOAL #2   Title Patient will be able to ambulate >/= 79ft using LRAD and supervision in order to improve household mobility    Time 8    Period Weeks    Status New    Target Date 06/24/21  PT LONG TERM GOAL #3   Title Patient will be able to stand >/= 1 minute with LRAD and supervision to improve homemaking ability    Time 8    Period Weeks    Status New    Target Date 06/24/21      PT LONG TERM GOAL #4   Title Patient will exhibit >/= 4+/5 MMT knee strength to improve ability to stand and walk    Time 8    Period Weeks    Status New    Target Date 06/24/21      PT LONG TERM GOAL #5   Title Patient will be able to perform all  transfers without assist or supervision using LRAD in order to improve safety and independence    Time 8    Period Weeks    Status New    Target Date 06/24/21                   Plan - 06/11/21 1906     Clinical Impression Statement Patient tolerated todays treatment with no increased pain, did not perform supine or sidelying today. Attempted side stepping in parallel bars; however, patient reported leg pain and only performed two steps.  Patient was able to step in parallel bars for 12' x 4 CGA at gait belt, with rest breaks between with significant UE support and poor extremely flexed posture, verbal cues for looking up helped some.  Patient now able to perform sit to stand by pushing on armrests rather than pulling up from bars, occasionally requires reminders.    Personal Factors and Comorbidities Fitness;Past/Current Experience;Time since onset of injury/illness/exacerbation;Comorbidity 3+;Transportation    Comorbidities DM, HTN, HLD, chronic pain syndrome    Examination-Activity Limitations Bathing;Locomotion Level;Transfers;Carry;Squat;Stairs;Dressing;Hygiene/Grooming;Lift;Toileting;Stand    Examination-Participation Restrictions Meal Prep;Cleaning;Community Activity;Driving;Shop;Laundry;Yard Work    PT Treatment/Interventions ADLs/Self Care Home Management;Aquatic Therapy;Cryotherapy;Electrical Stimulation;Iontophoresis 4mg /ml Dexamethasone;Moist Heat;DME Instruction;Neuromuscular re-education;Balance training;Therapeutic exercise;Therapeutic activities;Functional mobility training;Stair training;Gait training;Patient/family education;Manual techniques;Dry needling;Passive range of motion;Taping;Vasopneumatic Device;Spinal Manipulations;Joint Manipulations    PT Next Visit Plan Review HEP and progress PRN, progress LE ROM and core stability for LBP management, sit<>stand and transfer training in parallel bars/RW, gait training with RW and w/c follow    PT Home Exercise Plan [id number]     Consulted and Agree with Plan of Care Patient             Patient will benefit from skilled therapeutic intervention in order to improve the following deficits and impairments:  Difficulty walking, Decreased range of motion, Decreased activity tolerance, Pain, Decreased balance, Impaired flexibility, Postural dysfunction, Decreased strength, Decreased mobility, Decreased endurance  Visit Diagnosis: Chronic pain of left knee  Chronic pain of right knee  Other abnormalities of gait and mobility  Muscle weakness (generalized)  Chronic bilateral low back pain, unspecified whether sciatica present  Pain in right hip  Pain in left hip     Problem List Patient Active Problem List   Diagnosis Date Noted   Malnutrition of moderate degree 09/11/2020   Loss of weight 09/07/2020   Candidal intertrigo 09/07/2020   Fall at home 09/06/2020   Hypoglycemia 07/31/2020   Purulent Cellulitis of left hip 07/15/2020   Recurrent falls 07/15/2020   Frailty syndrome in geriatric patient 07/15/2020   Physical deconditioning 07/15/2020   Asymptomatic bacteriuria 07/15/2020   Diabetes mellitus (HCC) 01/28/2012    03/27/2012, PT 06/11/2021, 7:14 PM  [facility] [address] [place], [place], [place] Phone: [phone]  Fax:  [phone]  Name: [name] MRN: [id number] Date of Birth: 1956/11/02

## 2021-06-18 ENCOUNTER — Encounter: Payer: Self-pay | Admitting: Physical Therapy

## 2021-06-18 ENCOUNTER — Other Ambulatory Visit: Payer: Self-pay

## 2021-06-18 ENCOUNTER — Ambulatory Visit: Payer: Medicare Other | Admitting: Physical Therapy

## 2021-06-18 DIAGNOSIS — M6281 Muscle weakness (generalized): Secondary | ICD-10-CM

## 2021-06-18 DIAGNOSIS — M25552 Pain in left hip: Secondary | ICD-10-CM

## 2021-06-18 DIAGNOSIS — R2689 Other abnormalities of gait and mobility: Secondary | ICD-10-CM | POA: Diagnosis not present

## 2021-06-18 DIAGNOSIS — G8929 Other chronic pain: Secondary | ICD-10-CM

## 2021-06-18 DIAGNOSIS — M25551 Pain in right hip: Secondary | ICD-10-CM

## 2021-06-18 DIAGNOSIS — M25562 Pain in left knee: Secondary | ICD-10-CM

## 2021-06-18 NOTE — Therapy (Signed)
[facility] [address] [place], [place], [address] Phone: [phone]   Fax:  [phone]  Physical Therapy Treatment  Patient Details  Name: [name] MRN: [id number] Date of Birth: [birth year] Referring Provider (PT): Fleet Contras, MD   Encounter Date: 06/18/2021   PT End of Session - 06/18/21 1707     Visit Number 13    Number of Visits 16    Date for PT Re-Evaluation 06/24/21    Authorization Type UHC MCR    Authorization Time Period KX by 15th visit    Progress Note Due on Visit 19    PT Start Time 1707    PT Stop Time 1745    PT Time Calculation (min) 38 min    Activity Tolerance Patient limited by pain    Behavior During Therapy [facility] for tasks assessed/performed             Past Medical History:  Diagnosis Date   Cellulitis 07/17/2020   LEFT HIP   Diabetes mellitus    Hyperlipidemia    Hypertension     Past Surgical History:  Procedure Laterality Date   SPINE SURGERY      There were no vitals filed for this visit.   Subjective Assessment - 06/18/21 1707     Subjective "It's about a 5/10 today, the back and the knees, they go together." Reports good compliance with HEP and transfers are getting easier.    Limitations Standing;Walking;House hold activities    Currently in Pain? Yes    Pain Score 5     Pain Location Back    Pain Orientation Lower    Pain Descriptors / Indicators Dull    Pain Score 5    Pain Location Knee                [facility] PT Assessment - 06/18/21 0001       Assessment   Medical Diagnosis Hip and knee pain    Referring Provider (PT) Fleet Contras, MD      Precautions   Precautions Fall      Restrictions   Weight Bearing Restrictions No                           [facility] Adult PT Treatment/Exercise - 06/18/21 0001       Transfers   Transfers Supine to Sit;Sit to Stand;Stand Pivot Transfers    Sit to Stand 4: Min guard    Stand Pivot  Transfers 4: Min guard    Stand Pivot Transfer Details (indicate cue type and reason) CGA with gait belt and verbal cues for controlled descent.    Supine to Sit 6: Modified independent (Device/Increase time)      Ambulation/Gait   Ambulation/Gait Yes    Ambulation/Gait Assistance 4: Min guard    Gait Comments CGA with gait belt for steadying and safety, RW with close wheelchair follow, approximately 8-10' x 4 sets, patient for flexed posturing due to back pain, uncoordinated leg movement and placement      Lumbar Exercises: Seated   Other Seated Lumbar Exercises Ball roll out 3sec hold x 10      Knee/Hip Exercises: Aerobic   Nustep L7 x 5 min with UE and LE, single rest break required at 4:00      Knee/Hip Exercises: Sidelying   Hip ABduction 10 reps    Hip ABduction Limitations mod A    Clams no band today  x10ea, cues for decreased trunk rotation                      PT Short Term Goals - 05/28/21 1526       PT SHORT TERM GOAL #1   Title Patient will be I with initial HEP to progress with PT    Baseline patient requires cues for HEP    Time 4    Period Weeks    Status On-going    Target Date 05/27/21      PT SHORT TERM GOAL #2   Title Patient will be able to maintain standing position for >/= 10 seconds with min A and use of RW for assist to improve transfer ability    Baseline Patient able to maintain standing using RW > 10 sec with CGA    Time 4    Period Weeks    Status Achieved    Target Date 05/27/21      PT SHORT TERM GOAL #3   Title Patient will be able to perform stand pivot transfer WC<>mat table using RW with supervision to improve safety with transfers at home    Baseline Patient requires CGA and frequent cues for stand pivot transfer using RW    Time 4    Period Weeks    Status On-going    Target Date 05/27/21      PT SHORT TERM GOAL #4   Title Patient will exhibit improved knee strength >/= 4/5 MMT to improve stranding ability    Baseline  quads 4/5 MMT, hamstrings 4-/5 MMT    Time 4    Period Weeks    Status On-going    Target Date 05/27/21      PT SHORT TERM GOAL #5   Title Patient will report </= 5/10 pain level to reduce functional limitations    Baseline Patient reports continued low back and knee pain, this visit 5/10 but worsens with activity    Time 4    Period Weeks    Status On-going    Target Date 05/27/21               PT Long Term Goals - 04/30/21 0816       PT LONG TERM GOAL #1   Title Patient will be I with final HEP to maintain progress from PT    Time 8    Period Weeks    Status New    Target Date 06/24/21      PT LONG TERM GOAL #2   Title Patient will be able to ambulate >/= 30ft using LRAD and supervision in order to improve household mobility    Time 8    Period Weeks    Status New    Target Date 06/24/21      PT LONG TERM GOAL #3   Title Patient will be able to stand >/= 1 minute with LRAD and supervision to improve homemaking ability    Time 8    Period Weeks    Status New    Target Date 06/24/21      PT LONG TERM GOAL #4   Title Patient will exhibit >/= 4+/5 MMT knee strength to improve ability to stand and walk    Time 8    Period Weeks    Status New    Target Date 06/24/21      PT LONG TERM GOAL #5   Title Patient will be able to perform all transfers without  assist or supervision using LRAD in order to improve safety and independence    Time 8    Period Weeks    Status New    Target Date 06/24/21                   Plan - 06/18/21 2051     Clinical Impression Statement Patient progresses with gait tolerance in parallel bars, continues to require significant UE support and cues for posture, unable to stand erect; therefore not functional with gait yet, could benefit from RECERT for more therapy next visit as continued skilled PT will assist with progress for functional mobility and pain management.    Comorbidities DM, HTN, HLD, chronic pain syndrome     Examination-Activity Limitations Bathing;Locomotion Level;Transfers;Carry;Squat;Stairs;Dressing;Hygiene/Grooming;Lift;Toileting;Stand    Examination-Participation Restrictions Meal Prep;Cleaning;Community Activity;Driving;Shop;Laundry;Yard Work    PT Treatment/Interventions ADLs/Self Care Home Management;Aquatic Therapy;Cryotherapy;Electrical Stimulation;Iontophoresis 4mg /ml Dexamethasone;Moist Heat;DME Instruction;Neuromuscular re-education;Balance training;Therapeutic exercise;Therapeutic activities;Functional mobility training;Stair training;Gait training;Patient/family education;Manual techniques;Dry needling;Passive range of motion;Taping;Vasopneumatic Device;Spinal Manipulations;Joint Manipulations    PT Next Visit Plan FOTO and RECERT next visit. Review HEP and progress PRN, progress LE ROM and core stability for LBP management, sit<>stand and transfer training in parallel bars/RW, gait training with RW and w/c follow    PT Home Exercise Plan [id number]    Consulted and Agree with Plan of Care Patient             Patient will benefit from skilled therapeutic intervention in order to improve the following deficits and impairments:  Difficulty walking, Decreased range of motion, Decreased activity tolerance, Pain, Decreased balance, Impaired flexibility, Postural dysfunction, Decreased strength, Decreased mobility, Decreased endurance  Visit Diagnosis: Chronic pain of left knee  Chronic pain of right knee  Other abnormalities of gait and mobility  Muscle weakness (generalized)  Chronic bilateral low back pain, unspecified whether sciatica present  Pain in right hip  Pain in left hip     Problem List Patient Active Problem List   Diagnosis Date Noted   Malnutrition of moderate degree 09/11/2020   Loss of weight 09/07/2020   Candidal intertrigo 09/07/2020   Fall at home 09/06/2020   Hypoglycemia 07/31/2020   Purulent Cellulitis of left hip 07/15/2020   Recurrent falls  07/15/2020   Frailty syndrome in geriatric patient 07/15/2020   Physical deconditioning 07/15/2020   Asymptomatic bacteriuria 07/15/2020   Diabetes mellitus (HCC) 01/28/2012    03/27/2012, PT 06/18/2021, 8:56 PM  [facility] [address] [place], [place], [place] Phone: [phone]   Fax:  [phone]  Name: [name] MRN: [name] Date of Birth: September 26, 1956

## 2021-06-20 ENCOUNTER — Ambulatory Visit: Payer: Medicare Other | Admitting: Physical Therapy

## 2021-06-20 ENCOUNTER — Other Ambulatory Visit: Payer: Self-pay

## 2021-06-20 ENCOUNTER — Encounter: Payer: Self-pay | Admitting: Physical Therapy

## 2021-06-20 DIAGNOSIS — R2689 Other abnormalities of gait and mobility: Secondary | ICD-10-CM | POA: Diagnosis not present

## 2021-06-20 DIAGNOSIS — G8929 Other chronic pain: Secondary | ICD-10-CM

## 2021-06-20 DIAGNOSIS — M25551 Pain in right hip: Secondary | ICD-10-CM

## 2021-06-20 DIAGNOSIS — M6281 Muscle weakness (generalized): Secondary | ICD-10-CM

## 2021-06-20 DIAGNOSIS — M25552 Pain in left hip: Secondary | ICD-10-CM

## 2021-06-20 DIAGNOSIS — M25562 Pain in left knee: Secondary | ICD-10-CM

## 2021-06-20 NOTE — Addendum Note (Signed)
Addended by: Myrla Halsted on: 06/20/2021 06:22 PM   Modules accepted: Orders

## 2021-06-20 NOTE — Therapy (Signed)
[place], [place], [address] Phone: [phone]   Fax:  [phone]  Physical Therapy Treatment/[facility] Progress Note Reporting Period 06/20/2021 to 08/15/2021  See note below for Objective Data and Assessment of Progress/Goals.      Patient Details  Name: [name] MRN: [id number] Date of Birth: December 21, 1956 Referring Provider (PT): Nolene Ebbs, MD   Encounter Date: 06/20/2021   PT End of Session - 06/20/21 1614     Visit Number 14    Number of Visits 30    Date for PT Re-Evaluation 08/15/21    Authorization Type UHC MCR    Authorization Time Period KX by 15th visit    Progress Note Due on Visit 19    PT Start Time 1548    PT Stop Time 1615    PT Time Calculation (min) 27 min    Equipment Utilized During Treatment Gait belt    Activity Tolerance Patient limited by pain    Behavior During Therapy WFL for tasks assessed/performed             Past Medical History:  Diagnosis Date   Cellulitis 07/17/2020   LEFT HIP   Diabetes mellitus    Hyperlipidemia    Hypertension     Past Surgical History:  Procedure Laterality Date   SPINE SURGERY      There were no vitals filed for this visit.   Subjective Assessment - 06/20/21 1605     Subjective Patient reports it was a rough ride to therapy today, only the back hurts, but "I'm having muscle spasms".    Limitations Standing;Walking;House hold activities    Currently in Pain? Yes    Pain Score 6     Pain Location Back    Pain Orientation Lower    Pain Descriptors / Indicators Aching;Sharp;Dull    Pain Type Chronic pain    Pain Score 2    Pain Location Knee    Pain Orientation Right;Left    Pain Descriptors / Indicators Sharp;Aching    Pain Type Chronic pain                [facility] PT Assessment - 06/20/21 0001       Assessment   Medical Diagnosis Hip and knee pain    Referring Provider (PT) Nolene Ebbs, MD       Precautions   Precautions Fall      Restrictions   Weight Bearing Restrictions No      Strength   Right Hip Flexion 4/5    Right Hip ABduction 4+/5    Left Hip Flexion 3+/5    Left Hip ABduction 3-/5    Right Knee Flexion 4/5    Right Knee Extension 4+/5    Left Knee Flexion 4/5    Left Knee Extension 4/5    Right Ankle Dorsiflexion 5/5    Right Ankle Plantar Flexion 5/5    Left Ankle Dorsiflexion 4+/5    Left Ankle Plantar Flexion 4/5      Ambulation/Gait   Ambulation/Gait Assistance 4: Min guard    Gait Comments CGA with gait belt for steadying and safety, RW with close wheelchair follow, approximately 8-10' x 4 sets, patient for flexed posturing due to back pain, uncoordinated leg movement and placement                           [facility] Adult PT Treatment/Exercise - 06/20/21 0001  Therapeutic Activites    Other Therapeutic Activities Transfer to/from mat, to/from NuStep, w/c propulsiong with B UEs and with B LEs.      Knee/Hip Exercises: Aerobic   Nustep L6 x 5 min with UE and LE, no rest break today                    PT Education - 06/20/21 1613     Education Details Continue HEP with focus on knee/hip extension    Person(s) Educated Patient    Methods Explanation;Demonstration    Comprehension Verbalized understanding;Returned demonstration              PT Short Term Goals - 06/20/21 1558       PT SHORT TERM GOAL #1   Title Patient will be I with initial HEP to progress with PT    Baseline patient requires cues for HEP    Period Weeks    Status Achieved      PT SHORT TERM GOAL #2   Title Patient will be able to maintain standing position for >/= 10 seconds with min A and use of RW for assist to improve transfer ability    Baseline Patient able to maintain standing using RW > 10 sec with CGA    Time 4    Period Weeks    Status Achieved      PT SHORT TERM GOAL #3   Title Patient will be able to perform stand pivot  transfer WC<>mat table using RW with supervision to improve safety with transfers at home    Baseline Patient requires CGA and frequent cues for stand pivot transfer using RW    Time 4    Period Weeks    Status On-going      PT SHORT TERM GOAL #4   Title Patient will exhibit improved knee strength >/= 4/5 MMT to improve stranding ability    Time 4    Period Weeks    Status Achieved      PT SHORT TERM GOAL #5   Title Patient will report </= 5/10 pain level to reduce functional limitations    Baseline Patient reports continued low back and knee pain, this visit 5/10 but worsens with activity    Period Weeks    Status On-going               PT Long Term Goals - 06/20/21 1601       PT LONG TERM GOAL #1   Title Patient will be I with final HEP to maintain progress from PT    Time 8    Period Weeks    Status On-going    Target Date 08/15/21      PT LONG TERM GOAL #2   Title Patient will be able to ambulate >/= 74ft using LRAD and supervision in order to improve household mobility    Time 8    Period Weeks    Status On-going    Target Date 08/15/21      PT LONG TERM GOAL #3   Title Patient will be able to stand >/= 1 minute with LRAD and supervision to improve homemaking ability    Time 8    Status On-going    Target Date 08/15/21      PT LONG TERM GOAL #4   Title Patient will exhibit >/= 4+/5 MMT knee strength to improve ability to stand and walk    Period Weeks    Status On-going  Target Date 08/15/21      PT LONG TERM GOAL #5   Title Patient will be able to perform all transfers without assist or supervision using LRAD in order to improve safety and independence    Time 8    Status On-going    Target Date 08/15/21                   Plan - 06/20/21 1615     Clinical Impression Statement Patient 18 min late for [facility] appointment secondary transportation arrived late.  Patient LE strength has improved, B knee extension has improved significantly, L  hip ext with less progress preventing erect posture for ambulating. Transfers improved from needing assist to close SBA for sit to stand with UE support and squat-pivot. Patient has prgressed to stepping 12' in parallel bars with significant UE support and flexed posture and close SBA with chair follow, not yet functional or ready to transition to RW but significant improvement since initial evaluation.  Patient continues to be challenged by limited endurance that is variable and affected by back and knee pain level. Patient has met 3 of 4 STGoals and has progressed with all LTGoals.  Patient would benefit from continued skilled PT to address ongoing deficits and maximize safe functional mobility including patient goal of short functional walking in home to decreased caregiver burden.    Comorbidities DM, HTN, HLD, chronic pain syndrome    Examination-Activity Limitations Bathing;Locomotion Level;Transfers;Carry;Squat;Stairs;Dressing;Hygiene/Grooming;Lift;Toileting;Stand    Examination-Participation Restrictions Meal Prep;Cleaning;Community Activity;Driving;Shop;Laundry;Yard Work    Rehab Potential Good    PT Frequency 2x / week    PT Duration 8 weeks    PT Treatment/Interventions ADLs/Self Care Home Management;Aquatic Therapy;Cryotherapy;Electrical Stimulation;Iontophoresis 4mg /ml Dexamethasone;Moist Heat;DME Instruction;Neuromuscular re-education;Balance training;Therapeutic exercise;Therapeutic activities;Functional mobility training;Stair training;Gait training;Patient/family education;Manual techniques;Dry needling;Passive range of motion;Taping;Vasopneumatic Device;Spinal Manipulations;Joint Manipulations    PT Next Visit Plan Review HEP and progress PRN, progress LE ROM and core stability for LBP management, sit<>stand and transfer training in parallel bars/RW, gait training with RW and w/c follow.    PT Home Exercise Plan [id number]    Consulted and Agree with Plan of Care Patient              Patient will benefit from skilled therapeutic intervention in order to improve the following deficits and impairments:  Difficulty walking, Decreased range of motion, Decreased activity tolerance, Pain, Decreased balance, Impaired flexibility, Postural dysfunction, Decreased strength, Decreased mobility, Decreased endurance  Visit Diagnosis: Chronic pain of left knee  Chronic pain of right knee  Other abnormalities of gait and mobility  Muscle weakness (generalized)  Chronic bilateral low back pain, unspecified whether sciatica present  Pain in right hip  Pain in left hip     Problem List Patient Active Problem List   Diagnosis Date Noted   Malnutrition of moderate degree 09/11/2020   Loss of weight 09/07/2020   Candidal intertrigo 09/07/2020   Fall at home 09/06/2020   Hypoglycemia 07/31/2020   Purulent Cellulitis of left hip 07/15/2020   Recurrent falls 07/15/2020   Frailty syndrome in geriatric patient 07/15/2020   Physical deconditioning 07/15/2020   Asymptomatic bacteriuria 07/15/2020   Diabetes mellitus ([place]) 01/28/2012    Pollyann Samples, PT 06/20/2021, 6:18 PM  [facility] [address] [place], [place], [address] Phone: [phone]   Fax:  [phone]  Name: [name] MRN: [id number] Date of Birth: February 26, 1956

## 2021-06-25 ENCOUNTER — Other Ambulatory Visit: Payer: Self-pay

## 2021-06-25 ENCOUNTER — Ambulatory Visit: Payer: Medicare Other | Attending: Internal Medicine | Admitting: Physical Therapy

## 2021-06-25 ENCOUNTER — Encounter: Payer: Self-pay | Admitting: Physical Therapy

## 2021-06-25 DIAGNOSIS — M25561 Pain in right knee: Secondary | ICD-10-CM | POA: Insufficient documentation

## 2021-06-25 DIAGNOSIS — M25552 Pain in left hip: Secondary | ICD-10-CM | POA: Insufficient documentation

## 2021-06-25 DIAGNOSIS — G8929 Other chronic pain: Secondary | ICD-10-CM | POA: Insufficient documentation

## 2021-06-25 DIAGNOSIS — M545 Low back pain, unspecified: Secondary | ICD-10-CM | POA: Diagnosis present

## 2021-06-25 DIAGNOSIS — R2689 Other abnormalities of gait and mobility: Secondary | ICD-10-CM | POA: Insufficient documentation

## 2021-06-25 DIAGNOSIS — M25551 Pain in right hip: Secondary | ICD-10-CM | POA: Insufficient documentation

## 2021-06-25 DIAGNOSIS — M6281 Muscle weakness (generalized): Secondary | ICD-10-CM | POA: Insufficient documentation

## 2021-06-25 DIAGNOSIS — M25562 Pain in left knee: Secondary | ICD-10-CM | POA: Insufficient documentation

## 2021-06-25 NOTE — Therapy (Signed)
[facility] [address] [place], [place], [address] Phone: [phone]   Fax:  [phone]  Physical Therapy Treatment  Patient Details  Name: [name] MRN: [id number] Date of Birth: 02-01-1956 Referring Provider (PT): Fleet Contras, MD   Encounter Date: 06/25/2021   PT End of Session - 06/25/21 1852     Visit Number 15    Number of Visits 30    Date for PT Re-Evaluation 08/15/21    Authorization Type UHC MCR    Authorization Time Period KX by 15th visit    Progress Note Due on Visit 19    PT Start Time 1532    PT Stop Time 1615    PT Time Calculation (min) 43 min    Equipment Utilized During Treatment Gait belt    Activity Tolerance Patient limited by pain    Behavior During Therapy [facility] for tasks assessed/performed             Past Medical History:  Diagnosis Date   Cellulitis 07/17/2020   LEFT HIP   Diabetes mellitus    Hyperlipidemia    Hypertension     Past Surgical History:  Procedure Laterality Date   SPINE SURGERY      There were no vitals filed for this visit.   Subjective Assessment - 06/25/21 1539     Subjective "My back is killing me."    Currently in Pain? Yes    Pain Score 5     Pain Location Back    Pain Orientation Lower    Pain Descriptors / Indicators Aching;Sharp;Spasm    Pain Type Chronic pain    Pain Score 2    Pain Location Knee    Pain Orientation Right;Left    Pain Descriptors / Indicators Aching;Lambert Mody                [facility] PT Assessment - 06/25/21 0001       Assessment   Medical Diagnosis Hip and knee pain    Referring Provider (PT) Fleet Contras, MD      Precautions   Precautions Fall      Restrictions   Weight Bearing Restrictions No                           [facility] Adult PT Treatment/Exercise - 06/25/21 0001       Transfers   Transfers Supine to Sit;Sit to Stand;Stand Pivot Transfers    Sit to Stand 5: Supervision   Close for  sit<>stand and pivot   Sit to Stand Details (indicate cue type and reason) To RW with close supervision, standing at RW x30sec with v cues for "head up" to improve erect posture, looking into mirror.    Stand Pivot Transfers 5: Supervision    Stand Pivot Transfer Details (indicate cue type and reason) Close Supervision with gait belt    Supine to Sit 6: Modified independent (Device/Increase time)      Ambulation/Gait   Ambulation/Gait Yes    Ambulation/Gait Assistance 5: Supervision   Close   Ambulation Distance (Feet) 12 Feet    Assistive device Parallel bars    Gait Comments Close supervision with gait belt for steadying and safety, RW with close wheelchair follow, approximately 8-10' x 4 sets, patient for flexed posturing due to back pain, uncoordinated leg movement and placement      Therapeutic Activites    Other Therapeutic Activities Transfer to/from mat, to/from NuStep, w/c propulsiong  with B UEs and with B LEs.      Lumbar Exercises: Seated   Other Seated Lumbar Exercises Ball roll out 3sec hold x 10      Knee/Hip Exercises: Stretches   Other Knee/Hip Stretches Semi side sit with FABER stretch by PT.      Knee/Hip Exercises: Aerobic   Nustep L7 x 5 min with UE and LE, no rest break today      Knee/Hip Exercises: Seated   Clamshell with TheraBand Blue    Knee/Hip Flexion 2x20    Marching 10 reps;2 sets    Marching Limitations BLUE      Knee/Hip Exercises: Sidelying   Hip ABduction 10 reps    Hip ABduction Limitations L mod A, R min A    Clams no band today x10ea, cues for decreased trunk rotation                      PT Short Term Goals - 06/20/21 1558       PT SHORT TERM GOAL #1   Title Patient will be I with initial HEP to progress with PT    Baseline patient requires cues for HEP    Period Weeks    Status Achieved      PT SHORT TERM GOAL #2   Title Patient will be able to maintain standing position for >/= 10 seconds with min A and use of RW for  assist to improve transfer ability    Baseline Patient able to maintain standing using RW > 10 sec with CGA    Time 4    Period Weeks    Status Achieved      PT SHORT TERM GOAL #3   Title Patient will be able to perform stand pivot transfer WC<>mat table using RW with supervision to improve safety with transfers at home    Baseline Patient requires CGA and frequent cues for stand pivot transfer using RW    Time 4    Period Weeks    Status On-going      PT SHORT TERM GOAL #4   Title Patient will exhibit improved knee strength >/= 4/5 MMT to improve stranding ability    Time 4    Period Weeks    Status Achieved      PT SHORT TERM GOAL #5   Title Patient will report </= 5/10 pain level to reduce functional limitations    Baseline Patient reports continued low back and knee pain, this visit 5/10 but worsens with activity    Period Weeks    Status On-going               PT Long Term Goals - 06/20/21 1601       PT LONG TERM GOAL #1   Title Patient will be I with final HEP to maintain progress from PT    Time 8    Period Weeks    Status On-going    Target Date 08/15/21      PT LONG TERM GOAL #2   Title Patient will be able to ambulate >/= 75ft using LRAD and supervision in order to improve household mobility    Time 8    Period Weeks    Status On-going    Target Date 08/15/21      PT LONG TERM GOAL #3   Title Patient will be able to stand >/= 1 minute with LRAD and supervision to improve homemaking ability    Time  8    Status On-going    Target Date 08/15/21      PT LONG TERM GOAL #4   Title Patient will exhibit >/= 4+/5 MMT knee strength to improve ability to stand and walk    Period Weeks    Status On-going    Target Date 08/15/21      PT LONG TERM GOAL #5   Title Patient will be able to perform all transfers without assist or supervision using LRAD in order to improve safety and independence    Time 8    Status On-going    Target Date 08/15/21                    Plan - 06/25/21 1855     Clinical Impression Statement Patient continues with slow progress, continues with some mild improvement with standing tolerance, able to transfer to and stand at RW with close SBA and wheel chair behind, step fwd back with each foot, static x>30sec.  Patient Transfers are improving now, most are more controlled with close supervision.  Patient could benefit from continued skilled PT to address deficits and maximize safe functional mobility, could depend on ability to improve hip flexor ROM to closer to neutral hip extension.    Comorbidities DM, HTN, HLD, chronic pain syndrome    Examination-Activity Limitations Bathing;Locomotion Level;Transfers;Carry;Squat;Stairs;Dressing;Hygiene/Grooming;Lift;Toileting;Stand    Examination-Participation Restrictions Meal Prep;Cleaning;Community Activity;Driving;Shop;Laundry;Yard Work    PT Treatment/Interventions ADLs/Self Care Home Management;Aquatic Therapy;Cryotherapy;Electrical Stimulation;Iontophoresis 4mg /ml Dexamethasone;Moist Heat;DME Instruction;Neuromuscular re-education;Balance training;Therapeutic exercise;Therapeutic activities;Functional mobility training;Stair training;Gait training;Patient/family education;Manual techniques;Dry needling;Passive range of motion;Taping;Vasopneumatic Device;Spinal Manipulations;Joint Manipulations    PT Next Visit Plan Review HEP and progress PRN, progress LE ROM and core stability for LBP management, sit<>stand and transfer training in parallel bars/RW, gait training with RW and w/c follow.    PT Home Exercise Plan [id number]    Consulted and Agree with Plan of Care Patient             Patient will benefit from skilled therapeutic intervention in order to improve the following deficits and impairments:  Difficulty walking, Decreased range of motion, Decreased activity tolerance, Pain, Decreased balance, Impaired flexibility, Postural dysfunction, Decreased strength,  Decreased mobility, Decreased endurance  Visit Diagnosis: Chronic pain of left knee  Pain in right hip  Chronic pain of right knee  Pain in left hip  Other abnormalities of gait and mobility  Muscle weakness (generalized)  Chronic bilateral low back pain, unspecified whether sciatica present     Problem List Patient Active Problem List   Diagnosis Date Noted   Malnutrition of moderate degree 09/11/2020   Loss of weight 09/07/2020   Candidal intertrigo 09/07/2020   Fall at home 09/06/2020   Hypoglycemia 07/31/2020   Purulent Cellulitis of left hip 07/15/2020   Recurrent falls 07/15/2020   Frailty syndrome in geriatric patient 07/15/2020   Physical deconditioning 07/15/2020   Asymptomatic bacteriuria 07/15/2020   Diabetes mellitus ([facility]) 01/28/2012    03/27/2012, PT 06/25/2021, 7:18 PM  [facility] [address] [place], [place], [place] Phone: [phone]   Fax:  [phone]  Name: KYNG MATLOCK MRN: [name] Date of Birth: 1956/07/26

## 2021-06-25 NOTE — Therapy (Signed)
[facility] Outpatient Rehabilitation [facility] [address] [place], [place], [address] Phone: [phone]   Fax:  [phone]  Physical Therapy Treatment  Patient Details  Name: [name] MRN: [id number] Date of Birth: 02-01-1956 Referring Provider (PT): Fleet Contras, MD   Encounter Date: 06/25/2021   PT End of Session - 06/25/21 1852     Visit Number 15    Number of Visits 30    Date for PT Re-Evaluation 08/15/21    Authorization Type UHC MCR    Authorization Time Period KX by 15th visit    Progress Note Due on Visit 19    PT Start Time 1532    PT Stop Time 1615    PT Time Calculation (min) 43 min    Equipment Utilized During Treatment Gait belt    Activity Tolerance Patient limited by pain    Behavior During Therapy [facility] for tasks assessed/performed             Past Medical History:  Diagnosis Date   Cellulitis 07/17/2020   LEFT HIP   Diabetes mellitus    Hyperlipidemia    Hypertension     Past Surgical History:  Procedure Laterality Date   SPINE SURGERY      There were no vitals filed for this visit.   Subjective Assessment - 06/25/21 1539     Subjective "My back is killing me."    Currently in Pain? Yes    Pain Score 5     Pain Location Back    Pain Orientation Lower    Pain Descriptors / Indicators Aching;Sharp;Spasm    Pain Type Chronic pain    Pain Score 2    Pain Location Knee    Pain Orientation Right;Left    Pain Descriptors / Indicators Aching;Lambert Mody                [facility] PT Assessment - 06/25/21 0001       Assessment   Medical Diagnosis Hip and knee pain    Referring Provider (PT) Fleet Contras, MD      Precautions   Precautions Fall      Restrictions   Weight Bearing Restrictions No                           [facility] Adult PT Treatment/Exercise - 06/25/21 0001       Transfers   Transfers Supine to Sit;Sit to Stand;Stand Pivot Transfers    Sit to Stand 5: Supervision   Close for  sit<>stand and pivot   Sit to Stand Details (indicate cue type and reason) To RW with close supervision, standing at RW x30sec with v cues for "head up" to improve erect posture, looking into mirror.    Stand Pivot Transfers 5: Supervision    Stand Pivot Transfer Details (indicate cue type and reason) Close Supervision with gait belt    Supine to Sit 6: Modified independent (Device/Increase time)      Ambulation/Gait   Ambulation/Gait Yes    Ambulation/Gait Assistance 5: Supervision   Close   Ambulation Distance (Feet) 12 Feet    Assistive device Parallel bars    Gait Comments Close supervision with gait belt for steadying and safety, RW with close wheelchair follow, approximately 8-10' x 4 sets, patient for flexed posturing due to back pain, uncoordinated leg movement and placement      Therapeutic Activites    Other Therapeutic Activities Transfer to/from mat, to/from NuStep, w/c propulsiong  with B UEs and with B LEs.      Lumbar Exercises: Seated   Other Seated Lumbar Exercises Ball roll out 3sec hold x 10      Knee/Hip Exercises: Stretches   Other Knee/Hip Stretches Semi side sit with FABER stretch by PT.      Knee/Hip Exercises: Aerobic   Nustep L7 x 5 min with UE and LE, no rest break today      Knee/Hip Exercises: Seated   Clamshell with TheraBand Blue    Knee/Hip Flexion 2x20    Marching 10 reps;2 sets    Marching Limitations BLUE      Knee/Hip Exercises: Sidelying   Hip ABduction 10 reps    Hip ABduction Limitations L mod A, R min A    Clams no band today x10ea, cues for decreased trunk rotation                      PT Short Term Goals - 06/20/21 1558       PT SHORT TERM GOAL #1   Title Patient will be I with initial HEP to progress with PT    Baseline patient requires cues for HEP    Period Weeks    Status Achieved      PT SHORT TERM GOAL #2   Title Patient will be able to maintain standing position for >/= 10 seconds with min A and use of RW for  assist to improve transfer ability    Baseline Patient able to maintain standing using RW > 10 sec with CGA    Time 4    Period Weeks    Status Achieved      PT SHORT TERM GOAL #3   Title Patient will be able to perform stand pivot transfer WC<>mat table using RW with supervision to improve safety with transfers at home    Baseline Patient requires CGA and frequent cues for stand pivot transfer using RW    Time 4    Period Weeks    Status On-going      PT SHORT TERM GOAL #4   Title Patient will exhibit improved knee strength >/= 4/5 MMT to improve stranding ability    Time 4    Period Weeks    Status Achieved      PT SHORT TERM GOAL #5   Title Patient will report </= 5/10 pain level to reduce functional limitations    Baseline Patient reports continued low back and knee pain, this visit 5/10 but worsens with activity    Period Weeks    Status On-going               PT Long Term Goals - 06/20/21 1601       PT LONG TERM GOAL #1   Title Patient will be I with final HEP to maintain progress from PT    Time 8    Period Weeks    Status On-going    Target Date 08/15/21      PT LONG TERM GOAL #2   Title Patient will be able to ambulate >/= 75ft using LRAD and supervision in order to improve household mobility    Time 8    Period Weeks    Status On-going    Target Date 08/15/21      PT LONG TERM GOAL #3   Title Patient will be able to stand >/= 1 minute with LRAD and supervision to improve homemaking ability    Time  8    Status On-going    Target Date 08/15/21      PT LONG TERM GOAL #4   Title Patient will exhibit >/= 4+/5 MMT knee strength to improve ability to stand and walk    Period Weeks    Status On-going    Target Date 08/15/21      PT LONG TERM GOAL #5   Title Patient will be able to perform all transfers without assist or supervision using LRAD in order to improve safety and independence    Time 8    Status On-going    Target Date 08/15/21                    Plan - 06/25/21 1855     Clinical Impression Statement Patient continues with slow progress, continues with some mild improvement with standing tolerance, able to transfer to and stand at RW with close SBA and wheel chair behind, step fwd back with each foot, static x>30sec.  Patient Transfers are improving now, most are more controlled with close supervision.  Patient could benefit from continued skilled PT to address deficits and maximize safe functional mobility, could depend on ability to improve hip flexor ROM to closer to neutral hip extension.    Comorbidities DM, HTN, HLD, chronic pain syndrome    Examination-Activity Limitations Bathing;Locomotion Level;Transfers;Carry;Squat;Stairs;Dressing;Hygiene/Grooming;Lift;Toileting;Stand    Examination-Participation Restrictions Meal Prep;Cleaning;Community Activity;Driving;Shop;Laundry;Yard Work    PT Treatment/Interventions ADLs/Self Care Home Management;Aquatic Therapy;Cryotherapy;Electrical Stimulation;Iontophoresis 4mg /ml Dexamethasone;Moist Heat;DME Instruction;Neuromuscular re-education;Balance training;Therapeutic exercise;Therapeutic activities;Functional mobility training;Stair training;Gait training;Patient/family education;Manual techniques;Dry needling;Passive range of motion;Taping;Vasopneumatic Device;Spinal Manipulations;Joint Manipulations    PT Next Visit Plan Review HEP and progress PRN, progress LE ROM and core stability for LBP management, sit<>stand and transfer training in parallel bars/RW, gait training with RW and w/c follow.    PT Home Exercise Plan [id number]    Consulted and Agree with Plan of Care Patient             Patient will benefit from skilled therapeutic intervention in order to improve the following deficits and impairments:  Difficulty walking, Decreased range of motion, Decreased activity tolerance, Pain, Decreased balance, Impaired flexibility, Postural dysfunction, Decreased strength,  Decreased mobility, Decreased endurance  Visit Diagnosis: Chronic pain of left knee  Pain in right hip  Chronic pain of right knee  Pain in left hip  Other abnormalities of gait and mobility  Muscle weakness (generalized)  Chronic bilateral low back pain, unspecified whether sciatica present     Problem List Patient Active Problem List   Diagnosis Date Noted   Malnutrition of moderate degree 09/11/2020   Loss of weight 09/07/2020   Candidal intertrigo 09/07/2020   Fall at home 09/06/2020   Hypoglycemia 07/31/2020   Purulent Cellulitis of left hip 07/15/2020   Recurrent falls 07/15/2020   Frailty syndrome in geriatric patient 07/15/2020   Physical deconditioning 07/15/2020   Asymptomatic bacteriuria 07/15/2020   Diabetes mellitus (HCC) 01/28/2012    03/27/2012 06/25/2021, 7:02 PM  [facility] Outpatient Rehabilitation [facility] [address] [place], [place], [place] Phone: [phone]   Fax:  [phone]  Name: [name] MRN: [name] Date of Birth: 04/23/1956

## 2021-06-27 ENCOUNTER — Ambulatory Visit: Payer: Medicare Other | Admitting: Physical Therapy

## 2021-06-27 ENCOUNTER — Other Ambulatory Visit: Payer: Self-pay

## 2021-06-27 ENCOUNTER — Encounter: Payer: Self-pay | Admitting: Physical Therapy

## 2021-06-27 DIAGNOSIS — M25562 Pain in left knee: Secondary | ICD-10-CM | POA: Diagnosis not present

## 2021-06-27 DIAGNOSIS — M6281 Muscle weakness (generalized): Secondary | ICD-10-CM

## 2021-06-27 DIAGNOSIS — G8929 Other chronic pain: Secondary | ICD-10-CM

## 2021-06-27 DIAGNOSIS — M545 Low back pain, unspecified: Secondary | ICD-10-CM

## 2021-06-27 DIAGNOSIS — R2689 Other abnormalities of gait and mobility: Secondary | ICD-10-CM

## 2021-06-27 DIAGNOSIS — M25551 Pain in right hip: Secondary | ICD-10-CM

## 2021-06-27 DIAGNOSIS — M25552 Pain in left hip: Secondary | ICD-10-CM

## 2021-06-27 NOTE — Therapy (Signed)
[facility] Outpatient Rehabilitation [facility] [address] [place], [place], [address] Phone: [phone]   Fax:  [phone]  Physical Therapy Treatment  Patient Details  Name: [name] MRN: [id number] Date of Birth: 03/01/1956 Referring Provider (PT): Fleet Contras, MD   Encounter Date: 06/27/2021   PT End of Session - 06/27/21 1657     Visit Number 16    Number of Visits 30    Date for PT Re-Evaluation 08/15/21    Authorization Type UHC MCR    Authorization Time Period KX by 15th visit    Progress Note Due on Visit 19    PT Start Time 1536    PT Stop Time 1615    PT Time Calculation (min) 39 min    Equipment Utilized During Treatment Gait belt    Activity Tolerance Patient limited by pain    Behavior During Therapy [facility] for tasks assessed/performed             Past Medical History:  Diagnosis Date   Cellulitis 07/17/2020   LEFT HIP   Diabetes mellitus    Hyperlipidemia    Hypertension     Past Surgical History:  Procedure Laterality Date   SPINE SURGERY      There were no vitals filed for this visit.   Subjective Assessment - 06/27/21 1542     Subjective "Not too bad." I've been using the heat.    Currently in Pain? Yes    Pain Score 5     Pain Location Back    Pain Orientation Lower    Pain Score 2    Pain Orientation Right;Left    Pain Descriptors / Indicators Aching;Lambert Mody                [facility] PT Assessment - 06/27/21 0001       Assessment   Medical Diagnosis Hip and knee pain    Referring Provider (PT) Fleet Contras, MD      Precautions   Precautions Fall      Restrictions   Weight Bearing Restrictions No                           [facility] Adult PT Treatment/Exercise - 06/27/21 0001       Transfers   Transfers Supine to Sit;Sit to Stand;Stand Pivot Transfers    Sit to Stand 5: Supervision    Stand Pivot Transfers 5: Supervision    Stand Pivot Transfer Details (indicate cue type and  reason) Close supervision    Supine to Sit 6: Modified independent (Device/Increase time)      Ambulation/Gait   Ambulation/Gait Yes    Ambulation/Gait Assistance 5: Supervision    Ambulation Distance (Feet) 12 Feet    Assistive device Parallel bars    Gait Comments Close supervision with gait belt for steadying and safety, RW with close wheelchair follow, approximately 8-10' x 4 sets, patient for flexed posturing due to back pain, uncoordinated leg movement and placement      Therapeutic Activites    Other Therapeutic Activities Transfer to/from mat, to/from NuStep, w/c propulsiong with B UEs and with B LEs.      Knee/Hip Exercises: Aerobic   Nustep L7 x 5 min with UE and LE, no rest break today      Moist Heat Therapy   Moist Heat Location Lumbar Spine;Knee      Manual Therapy   Passive ROM STM R/L hamstring during passive h/s stretch  in sidelying. Attempt SKTC, patient with poor tolerance for supine.                    PT Education - 06/27/21 1657     Education Details Continue HEP with focus on knee/hip extension    Person(s) Educated Patient    Methods Explanation;Demonstration    Comprehension Verbalized understanding;Returned demonstration              PT Short Term Goals - 06/20/21 1558       PT SHORT TERM GOAL #1   Title Patient will be I with initial HEP to progress with PT    Baseline patient requires cues for HEP    Period Weeks    Status Achieved      PT SHORT TERM GOAL #2   Title Patient will be able to maintain standing position for >/= 10 seconds with min A and use of RW for assist to improve transfer ability    Baseline Patient able to maintain standing using RW > 10 sec with CGA    Time 4    Period Weeks    Status Achieved      PT SHORT TERM GOAL #3   Title Patient will be able to perform stand pivot transfer WC<>mat table using RW with supervision to improve safety with transfers at home    Baseline Patient requires CGA and frequent  cues for stand pivot transfer using RW    Time 4    Period Weeks    Status On-going      PT SHORT TERM GOAL #4   Title Patient will exhibit improved knee strength >/= 4/5 MMT to improve stranding ability    Time 4    Period Weeks    Status Achieved      PT SHORT TERM GOAL #5   Title Patient will report </= 5/10 pain level to reduce functional limitations    Baseline Patient reports continued low back and knee pain, this visit 5/10 but worsens with activity    Period Weeks    Status On-going               PT Long Term Goals - 06/20/21 1601       PT LONG TERM GOAL #1   Title Patient will be I with final HEP to maintain progress from PT    Time 8    Period Weeks    Status On-going    Target Date 08/15/21      PT LONG TERM GOAL #2   Title Patient will be able to ambulate >/= 67ft using LRAD and supervision in order to improve household mobility    Time 8    Period Weeks    Status On-going    Target Date 08/15/21      PT LONG TERM GOAL #3   Title Patient will be able to stand >/= 1 minute with LRAD and supervision to improve homemaking ability    Time 8    Status On-going    Target Date 08/15/21      PT LONG TERM GOAL #4   Title Patient will exhibit >/= 4+/5 MMT knee strength to improve ability to stand and walk    Period Weeks    Status On-going    Target Date 08/15/21      PT LONG TERM GOAL #5   Title Patient will be able to perform all transfers without assist or supervision using LRAD in order to improve  safety and independence    Time 8    Status On-going    Target Date 08/15/21                   Plan - 06/27/21 1658     Clinical Impression Statement Patient continues with slow progress, improving with gait posture looking into mirror and MHP before gait.    Personal Factors and Comorbidities Fitness;Past/Current Experience;Time since onset of injury/illness/exacerbation;Comorbidity 3+;Transportation    Comorbidities DM, HTN, HLD, chronic pain  syndrome    Examination-Activity Limitations Bathing;Locomotion Level;Transfers;Carry;Squat;Stairs;Dressing;Hygiene/Grooming;Lift;Toileting;Stand    Examination-Participation Restrictions Meal Prep;Cleaning;Community Activity;Driving;Shop;Laundry;Yard Work    PT Treatment/Interventions ADLs/Self Care Home Management;Aquatic Therapy;Cryotherapy;Electrical Stimulation;Iontophoresis 4mg /ml Dexamethasone;Moist Heat;DME Instruction;Neuromuscular re-education;Balance training;Therapeutic exercise;Therapeutic activities;Functional mobility training;Stair training;Gait training;Patient/family education;Manual techniques;Dry needling;Passive range of motion;Taping;Vasopneumatic Device;Spinal Manipulations;Joint Manipulations    PT Next Visit Plan Review HEP and progress PRN, progress LE ROM and core stability for LBP management, sit<>stand and transfer training in parallel bars/RW, gait training with RW and w/c follow.    PT Home Exercise Plan [id number]    Consulted and Agree with Plan of Care Patient             Patient will benefit from skilled therapeutic intervention in order to improve the following deficits and impairments:  Difficulty walking, Decreased range of motion, Decreased activity tolerance, Pain, Decreased balance, Impaired flexibility, Postural dysfunction, Decreased strength, Decreased mobility, Decreased endurance  Visit Diagnosis: Chronic pain of left knee  Pain in right hip  Chronic pain of right knee  Pain in left hip  Other abnormalities of gait and mobility  Muscle weakness (generalized)  Chronic bilateral low back pain, unspecified whether sciatica present     Problem List Patient Active Problem List   Diagnosis Date Noted   Malnutrition of moderate degree 09/11/2020   Loss of weight 09/07/2020   Candidal intertrigo 09/07/2020   Fall at home 09/06/2020   Hypoglycemia 07/31/2020   Purulent Cellulitis of left hip 07/15/2020   Recurrent falls 07/15/2020    Frailty syndrome in geriatric patient 07/15/2020   Physical deconditioning 07/15/2020   Asymptomatic bacteriuria 07/15/2020   Diabetes mellitus (HCC) 01/28/2012    03/27/2012, PT 06/27/2021, 5:02 PM  [facility] Outpatient Rehabilitation [facility] [address] [place], [place], [place] Phone: [phone]   Fax:  [phone]  Name: JOSIAS TOMERLIN MRN: [name] Date of Birth: 1956/12/06

## 2021-07-02 ENCOUNTER — Ambulatory Visit: Payer: Medicare Other | Admitting: Physical Therapy

## 2021-07-02 ENCOUNTER — Other Ambulatory Visit: Payer: Self-pay

## 2021-07-02 DIAGNOSIS — M25562 Pain in left knee: Secondary | ICD-10-CM | POA: Diagnosis not present

## 2021-07-02 DIAGNOSIS — M25552 Pain in left hip: Secondary | ICD-10-CM

## 2021-07-02 DIAGNOSIS — G8929 Other chronic pain: Secondary | ICD-10-CM

## 2021-07-02 DIAGNOSIS — R2689 Other abnormalities of gait and mobility: Secondary | ICD-10-CM

## 2021-07-02 DIAGNOSIS — M545 Low back pain, unspecified: Secondary | ICD-10-CM

## 2021-07-02 DIAGNOSIS — M25551 Pain in right hip: Secondary | ICD-10-CM

## 2021-07-02 DIAGNOSIS — M6281 Muscle weakness (generalized): Secondary | ICD-10-CM

## 2021-07-02 NOTE — Therapy (Signed)
[facility] Outpatient Rehabilitation [facility] [address] [place], [place], [address] Phone: [phone]   Fax:  [phone]  Physical Therapy Treatment  Patient Details  Name: [name] MRN: [id number] Date of Birth: 03-Mar-1956 Referring Provider (PT): Fleet Contras, MD   Encounter Date: 07/02/2021   PT End of Session - 07/02/21 1238     Visit Number 17    Number of Visits 30    Date for PT Re-Evaluation 08/15/21    Authorization Type UHC MCR    Authorization Time Period KX by 15th visit    Progress Note Due on Visit 19    PT Start Time 1230    PT Stop Time 1320    PT Time Calculation (min) 50 min             Past Medical History:  Diagnosis Date   Cellulitis 07/17/2020   LEFT HIP   Diabetes mellitus    Hyperlipidemia    Hypertension     Past Surgical History:  Procedure Laterality Date   SPINE SURGERY      There were no vitals filed for this visit.   Subjective Assessment - 07/02/21 1310     Subjective My knees are a 3/10 and m yback is a 5/10.    Currently in Pain? Yes    Pain Score 5     Pain Location Back    Aggravating Factors  laying on back, standing    Pain Relieving Factors sidelying , sitting    Pain Score 3    Pain Location Knee    Pain Orientation Left;Right    Pain Descriptors / Indicators Aching;Sharp    Pain Type Chronic pain    Aggravating Factors  activity    Pain Relieving Factors heat, rest                               [facility] Adult PT Treatment/Exercise - 07/02/21 0001       Ambulation/Gait   Ambulation/Gait Assistance 4: Min guard    Ambulation Distance (Feet) 15 Feet    Assistive device Rolling walker   close WC follow   Gait Comments Close supervision with gait belt for steadying and safety, RW with close wheelchair follow, approximately 8-10' x 4 sets, patient for flexed posturing due to back pain, uncoordinated leg movement and placement   all in parallel bars      Therapeutic Activites    Therapeutic Activities Other Therapeutic Activities;ADL's    ADL's standing with 1 UE support 30 sec x 2 in parallel bars    Other Therapeutic Activities Transfer to/from mat, to/from NuStep, w/c propulsiong with B UEs and with B LEs.      Knee/Hip Exercises: Stretches   Passive Hamstring Stretch Limitations passive in sidelying    Hip Flexor Stretch Limitations left only in rigt sidelying, pt declined to roll to other side.      Knee/Hip Exercises: Aerobic   Nustep L7 x 5 min with UE and LE, no rest break today      Knee/Hip Exercises: Seated   Long Arc Quad 20 reps    Long Arc Quad Weight --   2.5   Marching 10 reps;2 sets    Marching Limitations 2.5 weights      Knee/Hip Exercises: Sidelying   Clams x 10 Left, declined other side      Moist Heat Therapy   Number Minutes Moist Heat 12 Minutes  Moist Heat Location Hip;Knee   right sidelying                     PT Short Term Goals - 06/20/21 1558       PT SHORT TERM GOAL #1   Title Patient will be I with initial HEP to progress with PT    Baseline patient requires cues for HEP    Period Weeks    Status Achieved      PT SHORT TERM GOAL #2   Title Patient will be able to maintain standing position for >/= 10 seconds with min A and use of RW for assist to improve transfer ability    Baseline Patient able to maintain standing using RW > 10 sec with CGA    Time 4    Period Weeks    Status Achieved      PT SHORT TERM GOAL #3   Title Patient will be able to perform stand pivot transfer WC<>mat table using RW with supervision to improve safety with transfers at home    Baseline Patient requires CGA and frequent cues for stand pivot transfer using RW    Time 4    Period Weeks    Status On-going      PT SHORT TERM GOAL #4   Title Patient will exhibit improved knee strength >/= 4/5 MMT to improve stranding ability    Time 4    Period Weeks    Status Achieved      PT SHORT TERM GOAL  #5   Title Patient will report </= 5/10 pain level to reduce functional limitations    Baseline Patient reports continued low back and knee pain, this visit 5/10 but worsens with activity    Period Weeks    Status On-going               PT Long Term Goals - 06/20/21 1601       PT LONG TERM GOAL #1   Title Patient will be I with final HEP to maintain progress from PT    Time 8    Period Weeks    Status On-going    Target Date 08/15/21      PT LONG TERM GOAL #2   Title Patient will be able to ambulate >/= 20ft using LRAD and supervision in order to improve household mobility    Time 8    Period Weeks    Status On-going    Target Date 08/15/21      PT LONG TERM GOAL #3   Title Patient will be able to stand >/= 1 minute with LRAD and supervision to improve homemaking ability    Time 8    Status On-going    Target Date 08/15/21      PT LONG TERM GOAL #4   Title Patient will exhibit >/= 4+/5 MMT knee strength to improve ability to stand and walk    Period Weeks    Status On-going    Target Date 08/15/21      PT LONG TERM GOAL #5   Title Patient will be able to perform all transfers without assist or supervision using LRAD in order to improve safety and independence    Time 8    Status On-going    Target Date 08/15/21                   Plan - 07/02/21 1415     Clinical Impression Statement [name]  arrives in [facility] and self propels into gym area. Began with Nustep and he transfered with SBA. He ambulated 15 feet with RW and CGA and requires WC follow dues to need to sit quickly. He is fearful that he will fall like he did in the nursing home. Inaparrlal bars worked on standing with 1 UE assist. He reports the only standing he does at home is to wash his bottom during washing/grooming. As previous he performed 4 bouts of ambulation in parallel bars with WC close behind and cues to increased trunk extension and look forward for posture. Continued with seated LE  strengthening. Performed sidyling stretches and pt declined to lay on left side, only right side, so limited stretches for RLE. HMP applied to lumbar and inner thighs per his request. SBA back to [facility] and he self propelled him self to the lobby. He reported feeling fine at end of session.    PT Next Visit Plan Review HEP and progress PRN, progress LE ROM and core stability for LBP management, sit<>stand and transfer training in parallel bars/RW, gait training with RW and w/c follow.    PT Home Exercise Plan [id number]             Patient will benefit from skilled therapeutic intervention in order to improve the following deficits and impairments:  Difficulty walking, Decreased range of motion, Decreased activity tolerance, Pain, Decreased balance, Impaired flexibility, Postural dysfunction, Decreased strength, Decreased mobility, Decreased endurance  Visit Diagnosis: Chronic pain of left knee  Pain in right hip  Chronic pain of right knee  Pain in left hip  Other abnormalities of gait and mobility  Muscle weakness (generalized)  Chronic bilateral low back pain, unspecified whether sciatica present     Problem List Patient Active Problem List   Diagnosis Date Noted   Malnutrition of moderate degree 09/11/2020   Loss of weight 09/07/2020   Candidal intertrigo 09/07/2020   Fall at home 09/06/2020   Hypoglycemia 07/31/2020   Purulent Cellulitis of left hip 07/15/2020   Recurrent falls 07/15/2020   Frailty syndrome in geriatric patient 07/15/2020   Physical deconditioning 07/15/2020   Asymptomatic bacteriuria 07/15/2020   Diabetes mellitus (HCC) 01/28/2012    Sherrie Mustache, PTA 07/02/2021, 2:20 PM  [facility] Outpatient Rehabilitation [facility] [address] [place], [place], [address] Phone: [phone]   Fax:  [phone]  Name: [name] MRN: [id number] Date of Birth: 1956/11/04

## 2021-07-08 ENCOUNTER — Ambulatory Visit: Payer: Medicare Other | Admitting: Physical Therapy

## 2021-07-08 ENCOUNTER — Other Ambulatory Visit: Payer: Self-pay

## 2021-07-08 DIAGNOSIS — M25562 Pain in left knee: Secondary | ICD-10-CM | POA: Diagnosis not present

## 2021-07-08 DIAGNOSIS — M545 Low back pain, unspecified: Secondary | ICD-10-CM

## 2021-07-08 DIAGNOSIS — M25552 Pain in left hip: Secondary | ICD-10-CM

## 2021-07-08 DIAGNOSIS — M25551 Pain in right hip: Secondary | ICD-10-CM

## 2021-07-08 DIAGNOSIS — M6281 Muscle weakness (generalized): Secondary | ICD-10-CM

## 2021-07-08 DIAGNOSIS — G8929 Other chronic pain: Secondary | ICD-10-CM

## 2021-07-08 DIAGNOSIS — R2689 Other abnormalities of gait and mobility: Secondary | ICD-10-CM

## 2021-07-08 NOTE — Therapy (Signed)
[facility] Outpatient Rehabilitation [facility] [address] [place], [place], [address] Phone: [phone]   Fax:  [phone]  Physical Therapy Treatment  Patient Details  Name: [name] MRN: [id number] Date of Birth: 07-Oct-1956 Referring Provider (PT): Fleet Contras, MD   Encounter Date: 07/08/2021   PT End of Session - 07/08/21 1610     Visit Number 18    Number of Visits 30    Date for PT Re-Evaluation 08/15/21    Authorization Type UHC MCR    Authorization Time Period KX by 15th visit    PT Start Time 1545    PT Stop Time 1630    PT Time Calculation (min) 45 min             Past Medical History:  Diagnosis Date   Cellulitis 07/17/2020   LEFT HIP   Diabetes mellitus    Hyperlipidemia    Hypertension     Past Surgical History:  Procedure Laterality Date   SPINE SURGERY      There were no vitals filed for this visit.     [facility] Adult PT Treatment/Exercise - 07/08/21 0001       Ambulation/Gait   Ambulation/Gait Assistance 4: Min guard    Ambulation Distance (Feet) 24 Feet    Assistive device Rolling walker   close WC follow   Gait Comments in      Lumbar Exercises: Seated   Other Seated Lumbar Exercises overhead lift green band (looped) for spine extension    Other Seated Lumbar Exercises ER/IR x 10 green loop, bow and arrow single arm green band      Knee/Hip Exercises: Stretches   Active Hamstring Stretch Both;2 reps;30 seconds      Knee/Hip Exercises: Aerobic   Nustep L7 x 6 min with UE and LE, no rest break today      Knee/Hip Exercises: Standing   Functional Squat Limitations in parallel bars x 10      Knee/Hip Exercises: Seated   Long Arc Quad 20 reps    Long Arc Quad Weight 5 lbs.   2.5   Marching 20 reps;1 Buyer, retail Limitations 5 weights                      PT Short Term Goals - 06/20/21 1558       PT SHORT TERM GOAL #1   Title Patient will be I with initial HEP to progress with PT     Baseline patient requires cues for HEP    Period Weeks    Status Achieved      PT SHORT TERM GOAL #2   Title Patient will be able to maintain standing position for >/= 10 seconds with min A and use of RW for assist to improve transfer ability    Baseline Patient able to maintain standing using RW > 10 sec with CGA    Time 4    Period Weeks    Status Achieved      PT SHORT TERM GOAL #3   Title Patient will be able to perform stand pivot transfer WC<>mat table using RW with supervision to improve safety with transfers at home    Baseline Patient requires CGA and frequent cues for stand pivot transfer using RW    Time 4    Period Weeks    Status On-going      PT SHORT TERM GOAL #4   Title Patient will exhibit improved knee  strength >/= 4/5 MMT to improve stranding ability    Time 4    Period Weeks    Status Achieved      PT SHORT TERM GOAL #5   Title Patient will report </= 5/10 pain level to reduce functional limitations    Baseline Patient reports continued low back and knee pain, this visit 5/10 but worsens with activity    Period Weeks    Status On-going               PT Long Term Goals - 06/20/21 1601       PT LONG TERM GOAL #1   Title Patient will be I with final HEP to maintain progress from PT    Time 8    Period Weeks    Status On-going    Target Date 08/15/21      PT LONG TERM GOAL #2   Title Patient will be able to ambulate >/= 58ft using LRAD and supervision in order to improve household mobility    Time 8    Period Weeks    Status On-going    Target Date 08/15/21      PT LONG TERM GOAL #3   Title Patient will be able to stand >/= 1 minute with LRAD and supervision to improve homemaking ability    Time 8    Status On-going    Target Date 08/15/21      PT LONG TERM GOAL #4   Title Patient will exhibit >/= 4+/5 MMT knee strength to improve ability to stand and walk    Period Weeks    Status On-going    Target Date 08/15/21      PT LONG TERM  GOAL #5   Title Patient will be able to perform all transfers without assist or supervision using LRAD in order to improve safety and independence    Time 8    Status On-going    Target Date 08/15/21                   Plan - 07/08/21 1613     Clinical Impression Statement Patient able to walk about 24 feet with supervision only and setup.  Encouraged thoracic and hip extension throughout session.  Asked patient to work on standing tolerance when holding on the sink for up to 30 sec with UE assist.  Pain in back limits his ability to stand or try new stretching /positions for mobility. Overall, his pain is less than when he started and knee flexion contractures are improved .    PT Treatment/Interventions ADLs/Self Care Home Management;Aquatic Therapy;Cryotherapy;Electrical Stimulation;Iontophoresis 4mg /ml Dexamethasone;Moist Heat;DME Instruction;Neuromuscular re-education;Balance training;Therapeutic exercise;Therapeutic activities;Functional mobility training;Stair training;Gait training;Patient/family education;Manual techniques;Dry needling;Passive range of motion;Taping;Vasopneumatic Device;Spinal Manipulations;Joint Manipulations    PT Next Visit Plan Review HEP and progress PRN, progress LE ROM and core stability for LBP management, sit<>stand and transfer training in parallel bars/RW, gait training with RW and w/c follow.    PT Home Exercise Plan [id number]    Consulted and Agree with Plan of Care Patient             Patient will benefit from skilled therapeutic intervention in order to improve the following deficits and impairments:  Difficulty walking, Decreased range of motion, Decreased activity tolerance, Pain, Decreased balance, Impaired flexibility, Postural dysfunction, Decreased strength, Decreased mobility, Decreased endurance  Visit Diagnosis: Chronic pain of left knee  Pain in right hip  Chronic pain of right knee  Pain in left hip  Other abnormalities of  gait and mobility  Muscle weakness (generalized)  Chronic bilateral low back pain, unspecified whether sciatica present     Problem List Patient Active Problem List   Diagnosis Date Noted   Malnutrition of moderate degree 09/11/2020   Loss of weight 09/07/2020   Candidal intertrigo 09/07/2020   Fall at home 09/06/2020   Hypoglycemia 07/31/2020   Purulent Cellulitis of left hip 07/15/2020   Recurrent falls 07/15/2020   Frailty syndrome in geriatric patient 07/15/2020   Physical deconditioning 07/15/2020   Asymptomatic bacteriuria 07/15/2020   Diabetes mellitus (HCC) 01/28/2012    Jonathon Castelo 07/08/2021, 4:45 PM  [facility] Out[facility] Rehabilitation [facility] [address] [place], [place], [address] Phone: [phone]   Fax:  [phone]  Name: NGAI PARCELL MRN: [id number] Date of Birth: 02/09/1956  Karie Mainland, PT 07/08/21 4:46 PM Phone: [phone] Fax: [phone]

## 2021-07-10 ENCOUNTER — Encounter: Payer: Self-pay | Admitting: Physical Therapy

## 2021-07-10 ENCOUNTER — Other Ambulatory Visit: Payer: Self-pay

## 2021-07-10 ENCOUNTER — Ambulatory Visit: Payer: Medicare Other | Admitting: Physical Therapy

## 2021-07-10 DIAGNOSIS — M25562 Pain in left knee: Secondary | ICD-10-CM | POA: Diagnosis not present

## 2021-07-10 DIAGNOSIS — M6281 Muscle weakness (generalized): Secondary | ICD-10-CM

## 2021-07-10 DIAGNOSIS — M25552 Pain in left hip: Secondary | ICD-10-CM

## 2021-07-10 DIAGNOSIS — G8929 Other chronic pain: Secondary | ICD-10-CM

## 2021-07-10 DIAGNOSIS — M545 Low back pain, unspecified: Secondary | ICD-10-CM

## 2021-07-10 DIAGNOSIS — M25551 Pain in right hip: Secondary | ICD-10-CM

## 2021-07-10 DIAGNOSIS — R2689 Other abnormalities of gait and mobility: Secondary | ICD-10-CM

## 2021-07-10 DIAGNOSIS — M25561 Pain in right knee: Secondary | ICD-10-CM

## 2021-07-10 NOTE — Therapy (Signed)
[facility] Outpatient Rehabilitation [facility] [address] [place], [place], [address] Phone: [phone]   Fax:  [phone]  Physical Therapy Treatment  Progress Note Reporting Period 05/28/2021 to 07/10/2021  See note below for Objective Data and Assessment of Progress/Goals.    Patient Details  Name: [name] MRN: [id number] Date of Birth: 1956/07/10 Referring Provider (PT): Fleet Contras, MD   Encounter Date: 07/10/2021   PT End of Session - 07/10/21 1657     Visit Number 19    Number of Visits 30    Date for PT Re-Evaluation 08/15/21    Authorization Type UHC MCR    Authorization Time Period KX by 15th visit    Progress Note Due on Visit 29    PT Start Time 1555    PT Stop Time 1645    PT Time Calculation (min) 50 min    Equipment Utilized During Treatment Gait belt    Activity Tolerance Patient limited by pain;Patient tolerated treatment well    Behavior During Therapy [facility] for tasks assessed/performed             Past Medical History:  Diagnosis Date   Cellulitis 07/17/2020   LEFT HIP   Diabetes mellitus    Hyperlipidemia    Hypertension     Past Surgical History:  Procedure Laterality Date   SPINE SURGERY      There were no vitals filed for this visit.   Subjective Assessment - 07/10/21 1605     Subjective Patient reports he is doing fine, back is about a 5 and knees are a 3.    Patient Stated Goals Patient reports he wants to walk again    Currently in Pain? Yes    Pain Score 5     Pain Location Back    Pain Orientation Lower    Pain Descriptors / Indicators Aching;Sharp;Spasm    Pain Type Chronic pain    Pain Onset More than a month ago    Pain Frequency Constant    Pain Score 3    Pain Location Knee    Pain Orientation Right;Left    Pain Descriptors / Indicators Aching;Sharp    Pain Type Chronic pain    Pain Onset More than a month ago    Pain Frequency Intermittent                [facility] PT Assessment  - 07/10/21 0001       Assessment   Medical Diagnosis Hip and knee pain    Referring Provider (PT) Fleet Contras, MD      Precautions   Precautions Fall      Restrictions   Weight Bearing Restrictions No      Transfers   Stand Pivot Transfers 5: Supervision      Ambulation/Gait   Ambulation/Gait Yes    Ambulation/Gait Assistance 4: Min guard    Ambulation Distance (Feet) 15 Feet    Assistive device Rolling walker   close WC follow   Gait Comments Patient with flexed posturing due to back pain, unsteady foot placement, heavy reliance on BUE for support, occasional manual support with gait belt for steadying                           [facility] Adult PT Treatment/Exercise - 07/10/21 0001       Exercises   Exercises Knee/Hip      Knee/Hip Exercises: Aerobic   Nustep L7 x 5  min with UE/LE, no rest break today, while taking subjective      Knee/Hip Exercises: Standing   Heel Raises 2 sets;10 reps    Heel Raises Limitations in // bars with BUE support    Hip Flexion 2 sets;10 reps    Hip Flexion Limitations alternating march in // bars with BUE support    Functional Squat 2 sets;5 reps    Functional Squat Limitations in // bars with BUE support      Knee/Hip Exercises: Seated   Long Arc Quad 2 sets;20 reps    Long Arc Quad Weight 5 lbs.    Clamshell with TheraBand Green   2 x 20   Marching 2 sets;20 reps    Marching Limitations 5 weights    Hamstring Curl 2 sets;20 reps    Hamstring Limitations green    Sit to Sand 2 sets;10 reps   using BUE for support with one hand from table and one on RW                   PT Education - 07/10/21 1606     Education Details HEP, stretching    Person(s) Educated Patient    Methods Explanation;Demonstration;Verbal cues    Comprehension Verbalized understanding;Returned demonstration;Verbal cues required;Need further instruction              PT Short Term Goals - 06/20/21 1558       PT SHORT TERM  GOAL #1   Title Patient will be I with initial HEP to progress with PT    Baseline patient requires cues for HEP    Period Weeks    Status Achieved      PT SHORT TERM GOAL #2   Title Patient will be able to maintain standing position for >/= 10 seconds with min A and use of RW for assist to improve transfer ability    Baseline Patient able to maintain standing using RW > 10 sec with CGA    Time 4    Period Weeks    Status Achieved      PT SHORT TERM GOAL #3   Title Patient will be able to perform stand pivot transfer WC<>mat table using RW with supervision to improve safety with transfers at home    Baseline Patient requires CGA and frequent cues for stand pivot transfer using RW    Time 4    Period Weeks    Status On-going      PT SHORT TERM GOAL #4   Title Patient will exhibit improved knee strength >/= 4/5 MMT to improve stranding ability    Time 4    Period Weeks    Status Achieved      PT SHORT TERM GOAL #5   Title Patient will report </= 5/10 pain level to reduce functional limitations    Baseline Patient reports continued low back and knee pain, this visit 5/10 but worsens with activity    Period Weeks    Status On-going               PT Long Term Goals - 06/20/21 1601       PT LONG TERM GOAL #1   Title Patient will be I with final HEP to maintain progress from PT    Time 8    Period Weeks    Status On-going    Target Date 08/15/21      PT LONG TERM GOAL #2   Title Patient will be able  to ambulate >/= 60ft using LRAD and supervision in order to improve household mobility    Time 8    Period Weeks    Status On-going    Target Date 08/15/21      PT LONG TERM GOAL #3   Title Patient will be able to stand >/= 1 minute with LRAD and supervision to improve homemaking ability    Time 8    Status On-going    Target Date 08/15/21      PT LONG TERM GOAL #4   Title Patient will exhibit >/= 4+/5 MMT knee strength to improve ability to stand and walk     Period Weeks    Status On-going    Target Date 08/15/21      PT LONG TERM GOAL #5   Title Patient will be able to perform all transfers without assist or supervision using LRAD in order to improve safety and independence    Time 8    Status On-going    Target Date 08/15/21                   Plan - 07/10/21 1700     Clinical Impression Statement Patient continues to report low back and knee pain that limit standing/walking ability, but was able to tolerate standing exercise progression with frequent rest breaks. He was able to ambulate multiple bouts with RW and in // bars with min guard and close wheelchair follow. He continues to require heavy reliance on BUE for support due to leg weakness/coordination deficits and low back and knee pain. Patient is able to perform chair<>table transfers with supervision but does require cueing for foot/hand placement and controlling his decent to sit. Patient continues to make progress with standing activities and is motivated to improve, but is definitely limited by low back and knee pain.    PT Treatment/Interventions ADLs/Self Care Home Management;Aquatic Therapy;Cryotherapy;Electrical Stimulation;Iontophoresis 4mg /ml Dexamethasone;Moist Heat;DME Instruction;Neuromuscular re-education;Balance training;Therapeutic exercise;Therapeutic activities;Functional mobility training;Stair training;Gait training;Patient/family education;Manual techniques;Dry needling;Passive range of motion;Taping;Vasopneumatic Device;Spinal Manipulations;Joint Manipulations    PT Next Visit Plan Review HEP and progress PRN, progress LE ROM and core stability for LBP management, sit<>stand and transfer training in parallel bars/RW, gait training with RW and w/c follow.    PT Home Exercise Plan [id number]    Consulted and Agree with Plan of Care Patient             Patient will benefit from skilled therapeutic intervention in order to improve the following deficits and  impairments:  Difficulty walking, Decreased range of motion, Decreased activity tolerance, Pain, Decreased balance, Impaired flexibility, Postural dysfunction, Decreased strength, Decreased mobility, Decreased endurance  Visit Diagnosis: Other abnormalities of gait and mobility  Muscle weakness (generalized)  Chronic bilateral low back pain, unspecified whether sciatica present  Chronic pain of left knee  Chronic pain of right knee  Pain in right hip  Pain in left hip     Problem List Patient Active Problem List   Diagnosis Date Noted   Malnutrition of moderate degree 09/11/2020   Loss of weight 09/07/2020   Candidal intertrigo 09/07/2020   Fall at home 09/06/2020   Hypoglycemia 07/31/2020   Purulent Cellulitis of left hip 07/15/2020   Recurrent falls 07/15/2020   Frailty syndrome in geriatric patient 07/15/2020   Physical deconditioning 07/15/2020   Asymptomatic bacteriuria 07/15/2020   Diabetes mellitus (HCC) 01/28/2012    03/27/2012, PT, DPT, LAT, ATC 07/10/21  5:06 PM Phone: [phone] Fax: [phone]   Gisela  Outpatient [facility] [address] [place], [place], [address] Phone: [phone]   Fax:  [phone]  Name: Walter ManeRoger B Kane MRN: [id number] Date of Birth: [birth year]

## 2021-07-15 ENCOUNTER — Other Ambulatory Visit: Payer: Self-pay

## 2021-07-15 ENCOUNTER — Encounter: Payer: Self-pay | Admitting: Physical Therapy

## 2021-07-15 ENCOUNTER — Ambulatory Visit: Payer: Medicare Other | Admitting: Physical Therapy

## 2021-07-15 VITALS — HR 86

## 2021-07-15 DIAGNOSIS — R2689 Other abnormalities of gait and mobility: Secondary | ICD-10-CM

## 2021-07-15 DIAGNOSIS — M25551 Pain in right hip: Secondary | ICD-10-CM

## 2021-07-15 DIAGNOSIS — G8929 Other chronic pain: Secondary | ICD-10-CM

## 2021-07-15 DIAGNOSIS — M545 Low back pain, unspecified: Secondary | ICD-10-CM

## 2021-07-15 DIAGNOSIS — M25552 Pain in left hip: Secondary | ICD-10-CM

## 2021-07-15 DIAGNOSIS — M6281 Muscle weakness (generalized): Secondary | ICD-10-CM

## 2021-07-15 DIAGNOSIS — M25562 Pain in left knee: Secondary | ICD-10-CM | POA: Diagnosis not present

## 2021-07-15 NOTE — Therapy (Signed)
[facility] [address] [place], [place], [address] Phone: [phone]   Fax:  [phone]  Physical Therapy Treatment  Patient Details  Name: [name] MRN: [id number] Date of Birth: August 19, 1956 Referring Provider (PT): Fleet Contras, MD   Encounter Date: 07/15/2021   PT End of Session - 07/15/21 1532     Visit Number 20    Number of Visits 30    Date for PT Re-Evaluation 08/15/21    Authorization Type UHC MCR    Authorization Time Period KX by 15th visit    PT Start Time 1525    PT Stop Time 1613    PT Time Calculation (min) 48 min    Equipment Utilized During Treatment Gait belt    Activity Tolerance Patient limited by pain;Patient tolerated treatment well    Behavior During Therapy [facility] for tasks assessed/performed             Past Medical History:  Diagnosis Date   Cellulitis 07/17/2020   LEFT HIP   Diabetes mellitus    Hyperlipidemia    Hypertension     Past Surgical History:  Procedure Laterality Date   SPINE SURGERY      Vitals:   07/15/21 1524  Pulse: 86  SpO2: 99%     Subjective Assessment - 07/15/21 1524     Subjective Patient reports he is doing fine, back is about a 5 and knees are a 3.    Limitations Standing;Walking;House hold activities    Currently in Pain? Yes    Pain Score 5     Pain Location Back    Pain Score 3    Pain Location Knee    Pain Orientation Right;Left                [facility] PT Assessment - 07/15/21 0001       Assessment   Medical Diagnosis Hip and knee pain    Referring Provider (PT) Fleet Contras, MD      Precautions   Precautions Fall      Restrictions   Weight Bearing Restrictions No                           [facility] Adult PT Treatment/Exercise - 07/15/21 0001       Transfers   Stand Pivot Transfers 5: Supervision    Stand Pivot Transfer Details (indicate cue type and reason) verbal cues for foot placement    Supine to  Sit 6: Modified independent (Device/Increase time)      Ambulation/Gait   Ambulation/Gait Yes    Ambulation/Gait Assistance 4: Min guard    Ambulation Distance (Feet) --   20', 25' rest break and seated TE between.   Assistive device Rolling walker   w/c follow, CGA gait belt without LOB   Gait Comments Verbal cues for "head up" and improved posture, increased stride.      Knee/Hip Exercises: Stretches   Hip Flexor Stretch Limitations R/L with psoas positional release (not effective)    Other Knee/Hip Stretches FADIR stretch R/L      Knee/Hip Exercises: Aerobic   Nustep L7 x 5 min with UE/LE, no rest break today, while taking subjective      Knee/Hip Exercises: Standing   Functional Squat 2 sets;5 reps    Functional Squat Limitations in RW with w/c behind      Knee/Hip Exercises: Seated   Clamshell with [employer]  2 sets;20 reps    Marching Limitations GREEN    Hamstring Curl 2 sets;20 reps    Hamstring Limitations GREEN      Manual Therapy   Passive ROM Manual H/S and hip flexor stretch in sidelying                    PT Education - 07/15/21 1817     Education Details Encouraged rolling toward prone for hip flexor stretch, discussed possibility of Dry Needing for hip flexors, patient not interested.    Person(s) Educated Patient    Methods Explanation;Demonstration;Verbal cues    Comprehension Returned demonstration;Verbalized understanding              PT Short Term Goals - 06/20/21 1558       PT SHORT TERM GOAL #1   Title Patient will be I with initial HEP to progress with PT    Baseline patient requires cues for HEP    Period Weeks    Status Achieved      PT SHORT TERM GOAL #2   Title Patient will be able to maintain standing position for >/= 10 seconds with min A and use of RW for assist to improve transfer ability    Baseline Patient able to maintain standing using RW > 10 sec with CGA    Time 4    Period Weeks    Status  Achieved      PT SHORT TERM GOAL #3   Title Patient will be able to perform stand pivot transfer WC<>mat table using RW with supervision to improve safety with transfers at home    Baseline Patient requires CGA and frequent cues for stand pivot transfer using RW    Time 4    Period Weeks    Status On-going      PT SHORT TERM GOAL #4   Title Patient will exhibit improved knee strength >/= 4/5 MMT to improve stranding ability    Time 4    Period Weeks    Status Achieved      PT SHORT TERM GOAL #5   Title Patient will report </= 5/10 pain level to reduce functional limitations    Baseline Patient reports continued low back and knee pain, this visit 5/10 but worsens with activity    Period Weeks    Status On-going               PT Long Term Goals - 06/20/21 1601       PT LONG TERM GOAL #1   Title Patient will be I with final HEP to maintain progress from PT    Time 8    Period Weeks    Status On-going    Target Date 08/15/21      PT LONG TERM GOAL #2   Title Patient will be able to ambulate >/= 24ft using LRAD and supervision in order to improve household mobility    Time 8    Period Weeks    Status On-going    Target Date 08/15/21      PT LONG TERM GOAL #3   Title Patient will be able to stand >/= 1 minute with LRAD and supervision to improve homemaking ability    Time 8    Status On-going    Target Date 08/15/21      PT LONG TERM GOAL #4   Title Patient will exhibit >/= 4+/5 MMT knee strength to improve ability to stand and walk  Period Weeks    Status On-going    Target Date 08/15/21      PT LONG TERM GOAL #5   Title Patient will be able to perform all transfers without assist or supervision using LRAD in order to improve safety and independence    Time 8    Status On-going    Target Date 08/15/21                   Plan - 07/15/21 1819     Clinical Impression Statement Patient with c/o "hard to breath" and mild diaphoresis during activity  throughout visit, vitals stable with SPO2 >98%, HR~85bpm.  Patient walked a bit further today with RW but did require longer rest breaks than usual.  Patient does continue with slow progress, self limiting at times secondary back pain.  Patient would benefit from continued skilled PT to maximize safe functional mobility.    Comorbidities DM, HTN, HLD, chronic pain syndrome    Examination-Activity Limitations Bathing;Locomotion Level;Transfers;Carry;Squat;Stairs;Dressing;Hygiene/Grooming;Lift;Toileting;Stand    Examination-Participation Restrictions Meal Prep;Cleaning;Community Activity;Driving;Shop;Laundry;Yard Work    PT Treatment/Interventions ADLs/Self Care Home Management;Aquatic Therapy;Cryotherapy;Electrical Stimulation;Iontophoresis 4mg /ml Dexamethasone;Moist Heat;DME Instruction;Neuromuscular re-education;Balance training;Therapeutic exercise;Therapeutic activities;Functional mobility training;Stair training;Gait training;Patient/family education;Manual techniques;Dry needling;Passive range of motion;Taping;Vasopneumatic Device;Spinal Manipulations;Joint Manipulations    PT Next Visit Plan Review HEP and progress PRN, progress LE ROM and core stability for LBP management, sit<>stand and transfer training in parallel bars/RW, gait training with RW and w/c follow.    PT Home Exercise Plan [id number]    Consulted and Agree with Plan of Care Patient             Patient will benefit from skilled therapeutic intervention in order to improve the following deficits and impairments:  Difficulty walking, Decreased range of motion, Decreased activity tolerance, Pain, Decreased balance, Impaired flexibility, Postural dysfunction, Decreased strength, Decreased mobility, Decreased endurance  Visit Diagnosis: Other abnormalities of gait and mobility  Muscle weakness (generalized)  Chronic bilateral low back pain, unspecified whether sciatica present  Chronic pain of left knee  Chronic pain of right  knee  Pain in right hip  Pain in left hip     Problem List Patient Active Problem List   Diagnosis Date Noted   Malnutrition of moderate degree 09/11/2020   Loss of weight 09/07/2020   Candidal intertrigo 09/07/2020   Fall at home 09/06/2020   Hypoglycemia 07/31/2020   Purulent Cellulitis of left hip 07/15/2020   Recurrent falls 07/15/2020   Frailty syndrome in geriatric patient 07/15/2020   Physical deconditioning 07/15/2020   Asymptomatic bacteriuria 07/15/2020   Diabetes mellitus (HCC) 01/28/2012    03/27/2012, PT 07/15/2021, 6:26 PM  [facility] [address] [place], [place], [place] Phone: [phone]   Fax:  [phone]  Name: RODRIGUEZ AGUINALDO MRN: [name] Date of Birth: 11/05/1956

## 2021-07-22 ENCOUNTER — Other Ambulatory Visit: Payer: Self-pay

## 2021-07-22 ENCOUNTER — Encounter: Payer: Self-pay | Admitting: Physical Therapy

## 2021-07-22 ENCOUNTER — Ambulatory Visit: Payer: Medicare Other | Attending: Internal Medicine | Admitting: Physical Therapy

## 2021-07-22 DIAGNOSIS — M25562 Pain in left knee: Secondary | ICD-10-CM | POA: Diagnosis present

## 2021-07-22 DIAGNOSIS — M545 Low back pain, unspecified: Secondary | ICD-10-CM

## 2021-07-22 DIAGNOSIS — M6281 Muscle weakness (generalized): Secondary | ICD-10-CM

## 2021-07-22 DIAGNOSIS — M25551 Pain in right hip: Secondary | ICD-10-CM | POA: Diagnosis present

## 2021-07-22 DIAGNOSIS — M25552 Pain in left hip: Secondary | ICD-10-CM | POA: Diagnosis present

## 2021-07-22 DIAGNOSIS — G8929 Other chronic pain: Secondary | ICD-10-CM | POA: Diagnosis present

## 2021-07-22 DIAGNOSIS — M25561 Pain in right knee: Secondary | ICD-10-CM | POA: Diagnosis present

## 2021-07-22 DIAGNOSIS — R2689 Other abnormalities of gait and mobility: Secondary | ICD-10-CM

## 2021-07-22 NOTE — Therapy (Signed)
[facility] [address] [place], [place], [address] Phone: [phone]   Fax:  [phone]  Physical Therapy Treatment  Patient Details  Name: [name] MRN: [id number] Date of Birth: [birth year] Referring Provider (PT): Fleet Contras, MD   Encounter Date: 07/22/2021   PT End of Session - 07/22/21 1541     Visit Number 21    Number of Visits 30    Date for PT Re-Evaluation 08/15/21    Authorization Type UHC MCR    Authorization Time Period KX by 15th visit    Progress Note Due on Visit 29    PT Start Time 1530    PT Stop Time 1615    PT Time Calculation (min) 45 min    Equipment Utilized During Treatment Gait belt    Activity Tolerance Patient limited by pain;Patient tolerated treatment well    Behavior During Therapy [facility] for tasks assessed/performed             Past Medical History:  Diagnosis Date   Cellulitis 07/17/2020   LEFT HIP   Diabetes mellitus    Hyperlipidemia    Hypertension     Past Surgical History:  Procedure Laterality Date   SPINE SURGERY      There were no vitals filed for this visit.   Subjective Assessment - 07/22/21 1537     Subjective Patient reports he is doing about the same, still a 5 low back and 3 for the knees. He does state he feels like he is getting around a little better at home and has had no falls.    Patient Stated Goals Patient reports he wants to walk again    Currently in Pain? Yes    Pain Score 5     Pain Location Back    Pain Orientation Lower    Pain Descriptors / Indicators Aching;Sharp;Throbbing    Pain Type Chronic pain    Pain Onset More than a month ago    Pain Frequency Constant    Pain Score 3    Pain Location Knee    Pain Orientation Right;Left    Pain Descriptors / Indicators Aching;Sharp;Tightness    Pain Type Chronic pain    Pain Onset More than a month ago    Pain Frequency Intermittent                [facility] PT Assessment - 07/22/21  0001       Transfers   Comments Patient able to perform 180 deg stand pivot transfer using RW      Ambulation/Gait   Ambulation/Gait Yes    Ambulation/Gait Assistance 4: Min guard    Ambulation Distance (Feet) 20 Feet    Assistive device Rolling walker   w/c follow, CGA gait belt without LOB                          [facility] Adult PT Treatment/Exercise - 07/22/21 0001       Knee/Hip Exercises: Aerobic   Nustep L7 x 5 min with UE/LE, no rest break today, while taking subjective      Knee/Hip Exercises: Standing   Heel Raises 20 reps    Heel Raises Limitations in // bars with BUE support    Hip Flexion 10 reps    Hip Flexion Limitations alternating march in // bars with BUE support    Hip Abduction 10 reps    Abduction Limitations in // bars with  BUE support    Functional Squat 10 reps    Functional Squat Limitations in // bars with BUE support    Other Standing Knee Exercises Standing to fatigue x 5 in // with BUE support      Knee/Hip Exercises: Seated   Long Arc Quad 2 sets;20 reps    Long Arc Quad Weight 5 lbs.    Clamshell with [employer]   2 x 20   Marching 2 sets;20 reps    Marching Limitations 5#    Sit to [employer] 10 reps   using BUE for support with one hand from table and one on RW                   PT Education - 07/22/21 1538     Education Details Continued stretching and proper transfers for home ensuring he moves his feet    Person(s) Educated Patient    Methods Explanation;Demonstration;Verbal cues    Comprehension Returned demonstration;Verbalized understanding;Verbal cues required;Need further instruction              PT Short Term Goals - 06/20/21 1558       PT SHORT TERM GOAL #1   Title Patient will be I with initial HEP to progress with PT    Baseline patient requires cues for HEP    Period Weeks    Status Achieved      PT SHORT TERM GOAL #2   Title Patient will be able to maintain standing position for >/= 10  seconds with min A and use of RW for assist to improve transfer ability    Baseline Patient able to maintain standing using RW > 10 sec with CGA    Time 4    Period Weeks    Status Achieved      PT SHORT TERM GOAL #3   Title Patient will be able to perform stand pivot transfer WC<>mat table using RW with supervision to improve safety with transfers at home    Baseline Patient requires CGA and frequent cues for stand pivot transfer using RW    Time 4    Period Weeks    Status On-going      PT SHORT TERM GOAL #4   Title Patient will exhibit improved knee strength >/= 4/5 MMT to improve stranding ability    Time 4    Period Weeks    Status Achieved      PT SHORT TERM GOAL #5   Title Patient will report </= 5/10 pain level to reduce functional limitations    Baseline Patient reports continued low back and knee pain, this visit 5/10 but worsens with activity    Period Weeks    Status On-going               PT Long Term Goals - 06/20/21 1601       PT LONG TERM GOAL #1   Title Patient will be I with final HEP to maintain progress from PT    Time 8    Period Weeks    Status On-going    Target Date 08/15/21      PT LONG TERM GOAL #2   Title Patient will be able to ambulate >/= 79ft using LRAD and supervision in order to improve household mobility    Time 8    Period Weeks    Status On-going    Target Date 08/15/21      PT LONG TERM GOAL #3  Title Patient will be able to stand >/= 1 minute with LRAD and supervision to improve homemaking ability    Time 8    Status On-going    Target Date 08/15/21      PT LONG TERM GOAL #4   Title Patient will exhibit >/= 4+/5 MMT knee strength to improve ability to stand and walk    Period Weeks    Status On-going    Target Date 08/15/21      PT LONG TERM GOAL #5   Title Patient will be able to perform all transfers without assist or supervision using LRAD in order to improve safety and independence    Time 8    Status On-going     Target Date 08/15/21                   Plan - 07/22/21 1618     Clinical Impression Statement Patient tolerated therapy well with no adverse efffects. He was able to perform exercises better without breathing difficulties. Most of the standing exercises performing to fatigue this visit to work on standing tolerance. He continues to progress well with walking and standing transfer ability, but does require heavy reliance of BUE for support and CGA for safety. He continues to exhibit greater weakness on left side and decerased coordination with walking/foot placement bilaterally. Patient would benefit from continued skilled PT to progress his strength and mobility in order to maximize safe functional mobility and independence at home.    PT Treatment/Interventions ADLs/Self Care Home Management;Aquatic Therapy;Cryotherapy;Electrical Stimulation;Iontophoresis 4mg /ml Dexamethasone;Moist Heat;DME Instruction;Neuromuscular re-education;Balance training;Therapeutic exercise;Therapeutic activities;Functional mobility training;Stair training;Gait training;Patient/family education;Manual techniques;Dry needling;Passive range of motion;Taping;Vasopneumatic Device;Spinal Manipulations;Joint Manipulations    PT Next Visit Plan Review HEP and progress PRN, progress LE ROM and core stability for LBP management, sit<>stand and transfer training in parallel bars/RW, gait training with RW and w/c follow.    PT Home Exercise Plan [id number]    Consulted and Agree with Plan of Care Patient             Patient will benefit from skilled therapeutic intervention in order to improve the following deficits and impairments:  Difficulty walking, Decreased range of motion, Decreased activity tolerance, Pain, Decreased balance, Impaired flexibility, Postural dysfunction, Decreased strength, Decreased mobility, Decreased endurance  Visit Diagnosis: Other abnormalities of gait and mobility  Muscle weakness  (generalized)  Chronic bilateral low back pain, unspecified whether sciatica present  Chronic pain of left knee  Chronic pain of right knee  Pain in right hip  Pain in left hip     Problem List Patient Active Problem List   Diagnosis Date Noted   Malnutrition of moderate degree 09/11/2020   Loss of weight 09/07/2020   Candidal intertrigo 09/07/2020   Fall at home 09/06/2020   Hypoglycemia 07/31/2020   Purulent Cellulitis of left hip 07/15/2020   Recurrent falls 07/15/2020   Frailty syndrome in geriatric patient 07/15/2020   Physical deconditioning 07/15/2020   Asymptomatic bacteriuria 07/15/2020   Diabetes mellitus (HCC) 01/28/2012    03/27/2012, PT, DPT, LAT, ATC 07/22/21  4:24 PM Phone: [phone] Fax: [phone]   [facility] Out[facility] [address] [place], [place], [place] Phone: [phone]   Fax:  [phone]  Name: [name] MRN: [name] Date of Birth: 06-15-1956

## 2021-07-24 ENCOUNTER — Encounter: Payer: Medicare Other | Admitting: Physical Therapy

## 2021-07-29 ENCOUNTER — Other Ambulatory Visit: Payer: Self-pay

## 2021-07-29 ENCOUNTER — Encounter: Payer: Self-pay | Admitting: Physical Therapy

## 2021-07-29 ENCOUNTER — Ambulatory Visit: Payer: Medicare Other | Admitting: Physical Therapy

## 2021-07-29 DIAGNOSIS — M25552 Pain in left hip: Secondary | ICD-10-CM

## 2021-07-29 DIAGNOSIS — M6281 Muscle weakness (generalized): Secondary | ICD-10-CM

## 2021-07-29 DIAGNOSIS — R2689 Other abnormalities of gait and mobility: Secondary | ICD-10-CM | POA: Diagnosis not present

## 2021-07-29 DIAGNOSIS — M25562 Pain in left knee: Secondary | ICD-10-CM

## 2021-07-29 DIAGNOSIS — G8929 Other chronic pain: Secondary | ICD-10-CM

## 2021-07-29 DIAGNOSIS — M25551 Pain in right hip: Secondary | ICD-10-CM

## 2021-07-29 DIAGNOSIS — M545 Low back pain, unspecified: Secondary | ICD-10-CM

## 2021-07-29 NOTE — Therapy (Signed)
[facility] [address] [place], [place], [address] Phone: [phone]   Fax:  [phone]  Physical Therapy Treatment  Patient Details  Name: [name] MRN: [id number] Date of Birth: 07-26-1956 Referring Provider (PT): Fleet Contras, MD   Encounter Date: 07/29/2021   PT End of Session - 07/29/21 1452     Visit Number 22    Number of Visits 30    Date for PT Re-Evaluation 08/15/21    Authorization Type UHC MCR    Authorization Time Period KX modifier    Progress Note Due on Visit 29    PT Start Time 1445    PT Stop Time 1530    PT Time Calculation (min) 45 min    Equipment Utilized During Treatment Gait belt    Activity Tolerance Patient limited by pain;Patient tolerated treatment well    Behavior During Therapy [facility] for tasks assessed/performed             Past Medical History:  Diagnosis Date   Cellulitis 07/17/2020   LEFT HIP   Diabetes mellitus    Hyperlipidemia    Hypertension     Past Surgical History:  Procedure Laterality Date   SPINE SURGERY      There were no vitals filed for this visit.   Subjective Assessment - 07/29/21 1450     Subjective Patient reports his back is killing him and his knees are still bothering him.    Patient Stated Goals Patient reports he wants to walk again    Currently in Pain? Yes    Pain Score 5     Pain Location Back    Pain Orientation Lower    Pain Descriptors / Indicators Aching;Sharp;Throbbing    Pain Type Chronic pain    Pain Onset More than a month ago    Pain Frequency Constant    Pain Score 2    Pain Location Knee    Pain Orientation Right;Left    Pain Descriptors / Indicators Aching;Sharp;Tightness    Pain Type Chronic pain    Pain Onset More than a month ago    Pain Frequency Intermittent                [facility] PT Assessment - 07/29/21 0001       Transfers   Stand Pivot Transfers 5: Supervision      Ambulation/Gait    Ambulation/Gait Yes    Ambulation/Gait Assistance 4: Min guard    Ambulation Distance (Feet) 20 Feet   2 bouts   Assistive device Rolling walker   w/c follow, CGA gait belt without LOB                          [facility] Adult PT Treatment/Exercise - 07/29/21 0001       Exercises   Exercises Knee/Hip      Knee/Hip Exercises: Aerobic   Nustep L7 x 5 min with UE/LE, no rest break today, while taking subjective      Knee/Hip Exercises: Standing   Heel Raises 20 reps   2 sets   Heel Raises Limitations in // bars with BUE support    Hip Abduction 10 reps   2 sets   Abduction Limitations in // bars with BUE support    Functional Squat 10 reps   2 sets   Functional Squat Limitations in // bars with BUE support    Other Standing Knee Exercises Standing to fatigue x  5 in // with BUE support      Knee/Hip Exercises: Seated   Long Arc Quad 2 sets;20 reps    Long Arc Quad Weight 5 lbs.    Clamshell with TheraBand Green   2 x 20   Marching 2 sets;20 reps    Marching Limitations 5#    Hamstring Curl 2 sets;20 reps    Hamstring Limitations green    Sit to Sand 10 reps   using BUE for support with one hand from table and one on RW                   PT Education - 07/29/21 1451     Education Details HEP, transfers at home    Person(s) Educated Patient    Methods Explanation;Demonstration;Verbal cues    Comprehension Verbalized understanding;Returned demonstration;Verbal cues required;Need further instruction              PT Short Term Goals - 07/29/21 1454       PT SHORT TERM GOAL #1   Title Patient will be I with initial HEP to progress with PT    Baseline patient requires cues for HEP    Period Weeks    Status Achieved      PT SHORT TERM GOAL #2   Title Patient will be able to maintain standing position for >/= 10 seconds with min A and use of RW for assist to improve transfer ability    Baseline Patient able to maintain standing using RW > 10 sec  with CGA    Time 4    Period Weeks    Status Achieved      PT SHORT TERM GOAL #3   Title Patient will be able to perform stand pivot transfer WC<>mat table using RW with supervision to improve safety with transfers at home    Baseline supervision for stand pivot transfers    Time 4    Period Weeks    Status Achieved      PT SHORT TERM GOAL #4   Title Patient will exhibit improved knee strength >/= 4/5 MMT to improve stranding ability    Time 4    Period Weeks    Status Achieved      PT SHORT TERM GOAL #5   Title Patient will report </= 5/10 pain level to reduce functional limitations    Baseline Patient reports 5/10 low back pain    Period Weeks    Status Achieved               PT Long Term Goals - 06/20/21 1601       PT LONG TERM GOAL #1   Title Patient will be I with final HEP to maintain progress from PT    Time 8    Period Weeks    Status On-going    Target Date 08/15/21      PT LONG TERM GOAL #2   Title Patient will be able to ambulate >/= 34ft using LRAD and supervision in order to improve household mobility    Time 8    Period Weeks    Status On-going    Target Date 08/15/21      PT LONG TERM GOAL #3   Title Patient will be able to stand >/= 1 minute with LRAD and supervision to improve homemaking ability    Time 8    Status On-going    Target Date 08/15/21      PT LONG TERM  GOAL #4   Title Patient will exhibit >/= 4+/5 MMT knee strength to improve ability to stand and walk    Period Weeks    Status On-going    Target Date 08/15/21      PT LONG TERM GOAL #5   Title Patient will be able to perform all transfers without assist or supervision using LRAD in order to improve safety and independence    Time 8    Status On-going    Target Date 08/15/21                   Plan - 07/29/21 1453     Clinical Impression Statement Patient tolerated therapy well with no adverse efffects. Therapy continues to focus on progress LE strength  walking/standing ability. Patient demonstrates improvement in transfers using RW but continues to be limited with walking with CGA for 20 ft. Patient states low back pain and poor breathing ability as limiting factors for walking. Patient does seem to be improving with mobility overall. No changes made to HEP. Patient would benefit from continued skilled PT to progress his strength and mobility in order to maximize safe functional mobility and independence at home.    PT Treatment/Interventions ADLs/Self Care Home Management;Aquatic Therapy;Cryotherapy;Electrical Stimulation;Iontophoresis 4mg /ml Dexamethasone;Moist Heat;DME Instruction;Neuromuscular re-education;Balance training;Therapeutic exercise;Therapeutic activities;Functional mobility training;Stair training;Gait training;Patient/family education;Manual techniques;Dry needling;Passive range of motion;Taping;Vasopneumatic Device;Spinal Manipulations;Joint Manipulations    PT Next Visit Plan Review HEP and progress PRN, progress LE ROM and core stability for LBP management, sit<>stand and transfer training in parallel bars/RW, gait training with RW and w/c follow.    PT Home Exercise Plan [id number]    Consulted and Agree with Plan of Care Patient             Patient will benefit from skilled therapeutic intervention in order to improve the following deficits and impairments:  Difficulty walking, Decreased range of motion, Decreased activity tolerance, Pain, Decreased balance, Impaired flexibility, Postural dysfunction, Decreased strength, Decreased mobility, Decreased endurance  Visit Diagnosis: Other abnormalities of gait and mobility  Muscle weakness (generalized)  Chronic bilateral low back pain, unspecified whether sciatica present  Chronic pain of left knee  Chronic pain of right knee  Pain in right hip  Pain in left hip     Problem List Patient Active Problem List   Diagnosis Date Noted   Malnutrition of moderate degree  09/11/2020   Loss of weight 09/07/2020   Candidal intertrigo 09/07/2020   Fall at home 09/06/2020   Hypoglycemia 07/31/2020   Purulent Cellulitis of left hip 07/15/2020   Recurrent falls 07/15/2020   Frailty syndrome in geriatric patient 07/15/2020   Physical deconditioning 07/15/2020   Asymptomatic bacteriuria 07/15/2020   Diabetes mellitus (HCC) 01/28/2012    03/27/2012, PT, DPT, LAT, ATC 07/29/21  3:31 PM Phone: [phone] Fax: [phone]   [facility] [address] [place], [place], [place] Phone: [phone]   Fax:  [phone]  Name: [name] MRN: [name] Date of Birth: 1956/11/09

## 2021-07-31 ENCOUNTER — Other Ambulatory Visit: Payer: Self-pay

## 2021-07-31 ENCOUNTER — Encounter: Payer: Self-pay | Admitting: Physical Therapy

## 2021-07-31 ENCOUNTER — Ambulatory Visit: Payer: Medicare Other | Admitting: Physical Therapy

## 2021-07-31 DIAGNOSIS — M25562 Pain in left knee: Secondary | ICD-10-CM

## 2021-07-31 DIAGNOSIS — R2689 Other abnormalities of gait and mobility: Secondary | ICD-10-CM

## 2021-07-31 DIAGNOSIS — M25551 Pain in right hip: Secondary | ICD-10-CM

## 2021-07-31 DIAGNOSIS — G8929 Other chronic pain: Secondary | ICD-10-CM

## 2021-07-31 DIAGNOSIS — M6281 Muscle weakness (generalized): Secondary | ICD-10-CM

## 2021-07-31 DIAGNOSIS — M25552 Pain in left hip: Secondary | ICD-10-CM

## 2021-07-31 DIAGNOSIS — M545 Low back pain, unspecified: Secondary | ICD-10-CM

## 2021-07-31 NOTE — Therapy (Signed)
[facility] [address] [place], [place], [address] Phone: [phone]   Fax:  [phone]  Physical Therapy Treatment  Patient Details  Name: [name] MRN: [id number] Date of Birth: 02-13-1956 Referring Provider (PT): Fleet Contras, MD   Encounter Date: 07/31/2021   PT End of Session - 07/31/21 1455     Visit Number 23    Number of Visits 30    Date for PT Re-Evaluation 08/15/21    Authorization Type UHC MCR    Authorization Time Period KX modifier    Progress Note Due on Visit 29    PT Start Time 1445    PT Stop Time 1530    PT Time Calculation (min) 45 min    Equipment Utilized During Treatment Gait belt    Activity Tolerance Patient limited by pain;Patient tolerated treatment well    Behavior During Therapy [facility] for tasks assessed/performed             Past Medical History:  Diagnosis Date   Cellulitis 07/17/2020   LEFT HIP   Diabetes mellitus    Hyperlipidemia    Hypertension     Past Surgical History:  Procedure Laterality Date   SPINE SURGERY      There were no vitals filed for this visit.   Subjective Assessment - 07/31/21 1453     Subjective Patient reports his back is killing him. Still having knee pain. He saw the pain doctor and they told him he has 2 bad discs in his back.    Patient Stated Goals Patient reports he wants to walk again    Currently in Pain? Yes    Pain Score 5     Pain Location Back    Pain Orientation Lower    Pain Descriptors / Indicators Aching;Throbbing;Sharp    Pain Type Chronic pain    Pain Onset More than a month ago    Pain Frequency Constant    Pain Score 3    Pain Location Knee    Pain Orientation Right;Left    Pain Descriptors / Indicators Aching;Tightness;Sharp    Pain Type Chronic pain    Pain Onset More than a month ago    Pain Frequency Constant                [facility] PT Assessment - 07/31/21 0001       Ambulation/Gait   Ambulation/Gait  Yes    Ambulation/Gait Assistance 4: Min guard    Ambulation Distance (Feet) 25 Feet   2 bouts   Assistive device Rolling walker   w/c follow, CGA gait belt without LOB                          [facility] Adult PT Treatment/Exercise - 07/31/21 0001       Exercises   Exercises Knee/Hip      Knee/Hip Exercises: Aerobic   Nustep L7 x 5 min with UE/LE, no rest break today, while taking subjective      Knee/Hip Exercises: Standing   Heel Raises 20 reps    Heel Raises Limitations in // bars with BUE support    Hip Flexion 20 reps    Hip Flexion Limitations alternating march in // bars with BUE support    Hip Abduction 10 reps    Abduction Limitations in // bars with BUE support    Functional Squat 10 reps    Functional Squat Limitations in // bars with  BUE support    Other Standing Knee Exercises Standing to fatigue x 3 in // bars with BUE support    Other Standing Knee Exercises Walking in // bars x 5 lengths      Knee/Hip Exercises: Seated   Long Arc Quad 2 sets;20 reps    Long Arc Quad Weight 5 lbs.    Clamshell with TheraBand Green   2 x 20   Sit to Sand 10 reps   using BUE for support with one hand from table and one on RW                   PT Education - 07/31/21 1455     Education Details HEP    Person(s) Educated Patient    Methods Explanation;Demonstration;Verbal cues    Comprehension Verbalized understanding;Returned demonstration;Verbal cues required;Need further instruction              PT Short Term Goals - 07/29/21 1454       PT SHORT TERM GOAL #1   Title Patient will be I with initial HEP to progress with PT    Baseline patient requires cues for HEP    Period Weeks    Status Achieved      PT SHORT TERM GOAL #2   Title Patient will be able to maintain standing position for >/= 10 seconds with min A and use of RW for assist to improve transfer ability    Baseline Patient able to maintain standing using RW > 10 sec with CGA     Time 4    Period Weeks    Status Achieved      PT SHORT TERM GOAL #3   Title Patient will be able to perform stand pivot transfer WC<>mat table using RW with supervision to improve safety with transfers at home    Baseline supervision for stand pivot transfers    Time 4    Period Weeks    Status Achieved      PT SHORT TERM GOAL #4   Title Patient will exhibit improved knee strength >/= 4/5 MMT to improve stranding ability    Time 4    Period Weeks    Status Achieved      PT SHORT TERM GOAL #5   Title Patient will report </= 5/10 pain level to reduce functional limitations    Baseline Patient reports 5/10 low back pain    Period Weeks    Status Achieved               PT Long Term Goals - 06/20/21 1601       PT LONG TERM GOAL #1   Title Patient will be I with final HEP to maintain progress from PT    Time 8    Period Weeks    Status On-going    Target Date 08/15/21      PT LONG TERM GOAL #2   Title Patient will be able to ambulate >/= 24ft using LRAD and supervision in order to improve household mobility    Time 8    Period Weeks    Status On-going    Target Date 08/15/21      PT LONG TERM GOAL #3   Title Patient will be able to stand >/= 1 minute with LRAD and supervision to improve homemaking ability    Time 8    Status On-going    Target Date 08/15/21      PT LONG TERM GOAL #4  Title Patient will exhibit >/= 4+/5 MMT knee strength to improve ability to stand and walk    Period Weeks    Status On-going    Target Date 08/15/21      PT LONG TERM GOAL #5   Title Patient will be able to perform all transfers without assist or supervision using LRAD in order to improve safety and independence    Time 8    Status On-going    Target Date 08/15/21                   Plan - 07/31/21 1456     Clinical Impression Statement Patient tolerated therapy well with no adverse efffects. Therapy continued to focus on progressing walking with BUE support and  BLE strengthening. He was able to perform greater walking in parallel bars this visit and longer distance using rolling walker. He continues to exhibit flexed posture due to muscular tightness/contractures, and low back and knee pain that limit his tolerance for any standing activities. Patient would benefit from continued skilled PT to progress his strength and mobility in order to maximize safe functional mobility and independence at home.    PT Treatment/Interventions ADLs/Self Care Home Management;Aquatic Therapy;Cryotherapy;Electrical Stimulation;Iontophoresis 4mg /ml Dexamethasone;Moist Heat;DME Instruction;Neuromuscular re-education;Balance training;Therapeutic exercise;Therapeutic activities;Functional mobility training;Stair training;Gait training;Patient/family education;Manual techniques;Dry needling;Passive range of motion;Taping;Vasopneumatic Device;Spinal Manipulations;Joint Manipulations    PT Next Visit Plan Review HEP and progress PRN, progress LE ROM and core stability for LBP management, sit<>stand and transfer training in parallel bars/RW, gait training with RW and w/c follow.    PT Home Exercise Plan [id number]    Consulted and Agree with Plan of Care Patient             Patient will benefit from skilled therapeutic intervention in order to improve the following deficits and impairments:  Difficulty walking, Decreased range of motion, Decreased activity tolerance, Pain, Decreased balance, Impaired flexibility, Postural dysfunction, Decreased strength, Decreased mobility, Decreased endurance  Visit Diagnosis: Other abnormalities of gait and mobility  Muscle weakness (generalized)  Chronic bilateral low back pain, unspecified whether sciatica present  Chronic pain of left knee  Chronic pain of right knee  Pain in right hip  Pain in left hip     Problem List Patient Active Problem List   Diagnosis Date Noted   Malnutrition of moderate degree 09/11/2020   Loss of  weight 09/07/2020   Candidal intertrigo 09/07/2020   Fall at home 09/06/2020   Hypoglycemia 07/31/2020   Purulent Cellulitis of left hip 07/15/2020   Recurrent falls 07/15/2020   Frailty syndrome in geriatric patient 07/15/2020   Physical deconditioning 07/15/2020   Asymptomatic bacteriuria 07/15/2020   Diabetes mellitus (HCC) 01/28/2012    03/27/2012, PT, DPT, LAT, ATC 07/31/21  3:30 PM Phone: [phone] Fax: [phone]   [facility] [address] [place], [place], [place] Phone: [phone]   Fax:  [phone]  Name: JAVAD SALVA MRN: [name] Date of Birth: 03-03-1956

## 2021-08-05 ENCOUNTER — Ambulatory Visit: Payer: Medicare Other | Admitting: Physical Therapy

## 2021-08-05 ENCOUNTER — Encounter: Payer: Self-pay | Admitting: Physical Therapy

## 2021-08-05 ENCOUNTER — Other Ambulatory Visit: Payer: Self-pay

## 2021-08-05 DIAGNOSIS — M25561 Pain in right knee: Secondary | ICD-10-CM

## 2021-08-05 DIAGNOSIS — M25552 Pain in left hip: Secondary | ICD-10-CM

## 2021-08-05 DIAGNOSIS — M545 Low back pain, unspecified: Secondary | ICD-10-CM

## 2021-08-05 DIAGNOSIS — G8929 Other chronic pain: Secondary | ICD-10-CM

## 2021-08-05 DIAGNOSIS — R2689 Other abnormalities of gait and mobility: Secondary | ICD-10-CM

## 2021-08-05 DIAGNOSIS — M6281 Muscle weakness (generalized): Secondary | ICD-10-CM

## 2021-08-05 DIAGNOSIS — M25551 Pain in right hip: Secondary | ICD-10-CM

## 2021-08-05 NOTE — Therapy (Signed)
[facility] [address] [place], [place], [address] Phone: [phone]   Fax:  [phone]  Physical Therapy Treatment  Patient Details  Name: [name] MRN: [id number] Date of Birth: 06-14-1956 Referring Provider (PT): Fleet Contras, MD   Encounter Date: 08/05/2021   PT End of Session - 08/05/21 1448     Visit Number 24    Number of Visits 30    Date for PT Re-Evaluation 08/15/21    Authorization Type UHC MCR    Authorization Time Period KX modifier    Progress Note Due on Visit 29    PT Start Time 1440    PT Stop Time 1525    PT Time Calculation (min) 45 min    Equipment Utilized During Treatment Gait belt    Activity Tolerance Patient limited by pain;Patient tolerated treatment well    Behavior During Therapy [facility] for tasks assessed/performed             Past Medical History:  Diagnosis Date   Cellulitis 07/17/2020   LEFT HIP   Diabetes mellitus    Hyperlipidemia    Hypertension     Past Surgical History:  Procedure Laterality Date   SPINE SURGERY      There were no vitals filed for this visit.   Subjective Assessment - 08/05/21 1446     Subjective Patient reports he is doing alright. He still does the exercises at home and reports no new falls. He continues to have low back and bilateral knee pain with no chamges in symptoms.    Patient Stated Goals Patient reports he wants to walk again    Currently in Pain? Yes    Pain Score 6     Pain Location Back    Pain Orientation Lower    Pain Descriptors / Indicators Aching;Throbbing;Sharp    Pain Type Chronic pain    Pain Onset More than a month ago    Pain Frequency Constant    Aggravating Factors  Lying down, standing, walking    Pain Relieving Factors Rest    Pain Score 4    Pain Location Knee    Pain Orientation Right;Left    Pain Descriptors / Indicators Aching;Tightness;Sharp    Pain Type Chronic pain    Pain Onset More than a month ago     Pain Frequency Constant    Aggravating Factors  Standing or walking    Pain Relieving Factors Rest                [facility] PT Assessment - 08/05/21 0001       Ambulation/Gait   Ambulation/Gait Yes    Ambulation/Gait Assistance 4: Min guard    Ambulation Distance (Feet) 40 Feet    Assistive device Rolling walker   w/c follow, CGA gait belt without LOB                          [facility] Adult PT Treatment/Exercise - 08/05/21 0001       Exercises   Exercises Knee/Hip      Knee/Hip Exercises: Standing   Heel Raises 20 reps    Heel Raises Limitations in // bars with BUE support    Hip Flexion 20 reps    Hip Flexion Limitations alternating march in // bars with BUE support    Hip Abduction 10 reps    Abduction Limitations in // bars with BUE support    Functional Squat 10  reps    Functional Squat Limitations in // bars with BUE support    Other Standing Knee Exercises Standing in // bars with BUE support 5 x 45 seconds    Other Standing Knee Exercises Walking in // bars x 5 lengths      Knee/Hip Exercises: Seated   Long Arc Quad 2 sets;20 reps    Long Arc Quad Weight 7 lbs.    Clamshell with TheraBand Green   2 x 20   Marching 20 reps    Marching Limitations green band    Hamstring Curl 2 sets;20 reps    Hamstring Limitations green    Sit to Sand 10 reps   using BUE for support with one hand from table and one on RW                   PT Education - 08/05/21 1447     Education Details HEP    Person(s) Educated Patient    Methods Explanation;Demonstration;Verbal cues    Comprehension Verbalized understanding;Returned demonstration;Verbal cues required;Need further instruction              PT Short Term Goals - 07/29/21 1454       PT SHORT TERM GOAL #1   Title Patient will be I with initial HEP to progress with PT    Baseline patient requires cues for HEP    Period Weeks    Status Achieved      PT SHORT TERM GOAL #2   Title  Patient will be able to maintain standing position for >/= 10 seconds with min A and use of RW for assist to improve transfer ability    Baseline Patient able to maintain standing using RW > 10 sec with CGA    Time 4    Period Weeks    Status Achieved      PT SHORT TERM GOAL #3   Title Patient will be able to perform stand pivot transfer WC<>mat table using RW with supervision to improve safety with transfers at home    Baseline supervision for stand pivot transfers    Time 4    Period Weeks    Status Achieved      PT SHORT TERM GOAL #4   Title Patient will exhibit improved knee strength >/= 4/5 MMT to improve stranding ability    Time 4    Period Weeks    Status Achieved      PT SHORT TERM GOAL #5   Title Patient will report </= 5/10 pain level to reduce functional limitations    Baseline Patient reports 5/10 low back pain    Period Weeks    Status Achieved               PT Long Term Goals - 06/20/21 1601       PT LONG TERM GOAL #1   Title Patient will be I with final HEP to maintain progress from PT    Time 8    Period Weeks    Status On-going    Target Date 08/15/21      PT LONG TERM GOAL #2   Title Patient will be able to ambulate >/= 86ft using LRAD and supervision in order to improve household mobility    Time 8    Period Weeks    Status On-going    Target Date 08/15/21      PT LONG TERM GOAL #3   Title Patient will be able to  stand >/= 1 minute with LRAD and supervision to improve homemaking ability    Time 8    Status On-going    Target Date 08/15/21      PT LONG TERM GOAL #4   Title Patient will exhibit >/= 4+/5 MMT knee strength to improve ability to stand and walk    Period Weeks    Status On-going    Target Date 08/15/21      PT LONG TERM GOAL #5   Title Patient will be able to perform all transfers without assist or supervision using LRAD in order to improve safety and independence    Time 8    Status On-going    Target Date 08/15/21                    Plan - 08/05/21 1448     Clinical Impression Statement Patient tolerated therapy well with no adverse efffects. He continues to do well with his standing and sitting exercises but does report increased back and knee pain with standing which limits tolerance for increased time. He was able to progress walking distance this visit using rolling walker. Patient reports he is only able to perform seated exercises at home, he does not feel safe to do the standing exercises and is unable to tolerate exercise in lying positions. He states he only transfers from bed<>chair and toilet but doesn't use a rolling walker. Overall he is progressing in therapy but likely not having much carryover at home due to patient living alone and not feeling safe with standing exercises. Patient would benefit from continued skilled PT to progress his strength and mobility in order to maximize safe functional mobility and independence at home.    PT Treatment/Interventions ADLs/Self Care Home Management;Aquatic Therapy;Cryotherapy;Electrical Stimulation;Iontophoresis 4mg /ml Dexamethasone;Moist Heat;DME Instruction;Neuromuscular re-education;Balance training;Therapeutic exercise;Therapeutic activities;Functional mobility training;Stair training;Gait training;Patient/family education;Manual techniques;Dry needling;Passive range of motion;Taping;Vasopneumatic Device;Spinal Manipulations;Joint Manipulations    PT Next Visit Plan Review HEP and progress PRN, progress LE ROM and core stability for LBP management, sit<>stand and transfer training in parallel bars/RW, gait training with RW and w/c follow.    PT Home Exercise Plan [id number]    Consulted and Agree with Plan of Care Patient             Patient will benefit from skilled therapeutic intervention in order to improve the following deficits and impairments:  Difficulty walking, Decreased range of motion, Decreased activity tolerance, Pain, Decreased  balance, Impaired flexibility, Postural dysfunction, Decreased strength, Decreased mobility, Decreased endurance  Visit Diagnosis: Other abnormalities of gait and mobility  Muscle weakness (generalized)  Chronic bilateral low back pain, unspecified whether sciatica present  Chronic pain of left knee  Chronic pain of right knee  Pain in right hip  Pain in left hip     Problem List Patient Active Problem List   Diagnosis Date Noted   Malnutrition of moderate degree 09/11/2020   Loss of weight 09/07/2020   Candidal intertrigo 09/07/2020   Fall at home 09/06/2020   Hypoglycemia 07/31/2020   Purulent Cellulitis of left hip 07/15/2020   Recurrent falls 07/15/2020   Frailty syndrome in geriatric patient 07/15/2020   Physical deconditioning 07/15/2020   Asymptomatic bacteriuria 07/15/2020   Diabetes mellitus (HCC) 01/28/2012    Rosana Hoesampbell Sheldon Sem, PT, DPT, LAT, ATC 08/05/21  3:34 PM Phone: [phone] Fax: [phone]   [facility] [address] [place], [place], [address] Phone: [phone]   Fax:  [phone]  Name: Walter Coveoger  TYREKE Kane MRN: [id number] Date of Birth: 02-10-1956

## 2021-08-07 ENCOUNTER — Ambulatory Visit: Payer: Medicare Other | Admitting: Physical Therapy

## 2021-08-07 ENCOUNTER — Other Ambulatory Visit: Payer: Self-pay

## 2021-08-07 ENCOUNTER — Encounter: Payer: Self-pay | Admitting: Physical Therapy

## 2021-08-07 DIAGNOSIS — G8929 Other chronic pain: Secondary | ICD-10-CM

## 2021-08-07 DIAGNOSIS — M25551 Pain in right hip: Secondary | ICD-10-CM

## 2021-08-07 DIAGNOSIS — M25561 Pain in right knee: Secondary | ICD-10-CM

## 2021-08-07 DIAGNOSIS — M25552 Pain in left hip: Secondary | ICD-10-CM

## 2021-08-07 DIAGNOSIS — R2689 Other abnormalities of gait and mobility: Secondary | ICD-10-CM | POA: Diagnosis not present

## 2021-08-07 DIAGNOSIS — M545 Low back pain, unspecified: Secondary | ICD-10-CM

## 2021-08-07 DIAGNOSIS — M6281 Muscle weakness (generalized): Secondary | ICD-10-CM

## 2021-08-07 NOTE — Therapy (Signed)
[facility] [address] [place], [place], [address] Phone: [phone]   Fax:  [phone]  Physical Therapy Treatment  Patient Details  Name: [name] MRN: [id number] Date of Birth: January 16, 1956 Referring Provider (PT): Fleet Contras, MD   Encounter Date: 08/07/2021   PT End of Session - 08/07/21 1506     Visit Number 25    Number of Visits 30    Date for PT Re-Evaluation 08/15/21    Authorization Type UHC MCR    Authorization Time Period KX modifier    Progress Note Due on Visit 29    PT Start Time 1500   patient arrived late due to transportation   PT Stop Time 1530    PT Time Calculation (min) 30 min    Equipment Utilized During Treatment Gait belt    Activity Tolerance Patient limited by pain;Patient tolerated treatment well    Behavior During Therapy [facility] for tasks assessed/performed             Past Medical History:  Diagnosis Date   Cellulitis 07/17/2020   LEFT HIP   Diabetes mellitus    Hyperlipidemia    Hypertension     Past Surgical History:  Procedure Laterality Date   SPINE SURGERY      There were no vitals filed for this visit.   Subjective Assessment - 08/07/21 1504     Subjective Patient reports back and knees still hurt. He does his sitting exercises with the band at home.    Patient Stated Goals Patient reports he wants to walk again    Currently in Pain? Yes    Pain Score 5     Pain Location Back    Pain Orientation Lower    Pain Descriptors / Indicators Aching;Throbbing;Sharp    Pain Type Chronic pain    Pain Onset More than a month ago    Pain Frequency Constant    Aggravating Factors  Constant pain, unable to lay on back, standing, walking    Pain Score 2    Pain Location Knee    Pain Orientation Right;Left    Pain Descriptors / Indicators Aching;Throbbing;Sharp    Pain Type Chronic pain    Pain Onset More than a month ago    Pain Frequency Constant    Aggravating  Factors  Standing or walking                [facility] PT Assessment - 08/07/21 0001       Ambulation/Gait   Ambulation/Gait Yes    Ambulation/Gait Assistance 4: Min guard    Ambulation Distance (Feet) 45 Feet    Assistive device Rolling walker   w/c follow, CGA gait belt without LOB                          [facility] Adult PT Treatment/Exercise - 08/07/21 0001       Exercises   Exercises Knee/Hip      Knee/Hip Exercises: Aerobic   Nustep L7 x 5 min with UE/LE, no rest break today, while taking subjective      Knee/Hip Exercises: Standing   Heel Raises 20 reps    Heel Raises Limitations in // bars with BUE support    Hip Flexion 20 reps    Hip Flexion Limitations alternating march in // bars with BUE support    Hip Abduction 10 reps    Abduction Limitations in // bars with BUE support  Functional Squat 10 reps    Functional Squat Limitations in // bars with BUE support    Other Standing Knee Exercises Standing in // bars with BUE support 5 x 45 seconds    Other Standing Knee Exercises Walking in // bars x 5 lengths   heavy reliance on UEs for support, unable to attain upright posture due to back pain     Knee/Hip Exercises: Seated   Sit to Sand 5 reps   using BUE for support with one hand from table and one on RW                   PT Education - 08/07/21 1506     Education Details HEP    Person(s) Educated Patient    Methods Explanation    Comprehension Verbalized understanding              PT Short Term Goals - 07/29/21 1454       PT SHORT TERM GOAL #1   Title Patient will be I with initial HEP to progress with PT    Baseline patient requires cues for HEP    Period Weeks    Status Achieved      PT SHORT TERM GOAL #2   Title Patient will be able to maintain standing position for >/= 10 seconds with min A and use of RW for assist to improve transfer ability    Baseline Patient able to maintain standing using RW > 10 sec with CGA     Time 4    Period Weeks    Status Achieved      PT SHORT TERM GOAL #3   Title Patient will be able to perform stand pivot transfer WC<>mat table using RW with supervision to improve safety with transfers at home    Baseline supervision for stand pivot transfers    Time 4    Period Weeks    Status Achieved      PT SHORT TERM GOAL #4   Title Patient will exhibit improved knee strength >/= 4/5 MMT to improve stranding ability    Time 4    Period Weeks    Status Achieved      PT SHORT TERM GOAL #5   Title Patient will report </= 5/10 pain level to reduce functional limitations    Baseline Patient reports 5/10 low back pain    Period Weeks    Status Achieved               PT Long Term Goals - 06/20/21 1601       PT LONG TERM GOAL #1   Title Patient will be I with final HEP to maintain progress from PT    Time 8    Period Weeks    Status On-going    Target Date 08/15/21      PT LONG TERM GOAL #2   Title Patient will be able to ambulate >/= 69ft using LRAD and supervision in order to improve household mobility    Time 8    Period Weeks    Status On-going    Target Date 08/15/21      PT LONG TERM GOAL #3   Title Patient will be able to stand >/= 1 minute with LRAD and supervision to improve homemaking ability    Time 8    Status On-going    Target Date 08/15/21      PT LONG TERM GOAL #4   Title Patient will  exhibit >/= 4+/5 MMT knee strength to improve ability to stand and walk    Period Weeks    Status On-going    Target Date 08/15/21      PT LONG TERM GOAL #5   Title Patient will be able to perform all transfers without assist or supervision using LRAD in order to improve safety and independence    Time 8    Status On-going    Target Date 08/15/21                   Plan - 08/07/21 1507     Clinical Impression Statement Patient tolerated therapy well with no adverse efffects. Therapy limited due to patient arriving late. Therapy focused on  standing exercises in parallel bars. Patient did demonstrate conitnued improvement in walking distance using RW and CGA at gait belt. He continues to report increased low back and knee pain with any standing exercise or walking. Patient would benefit from continued skilled PT to progress his strength and mobility in order to maximize safe functional mobility and independence at home.    PT Treatment/Interventions ADLs/Self Care Home Management;Aquatic Therapy;Cryotherapy;Electrical Stimulation;Iontophoresis 4mg /ml Dexamethasone;Moist Heat;DME Instruction;Neuromuscular re-education;Balance training;Therapeutic exercise;Therapeutic activities;Functional mobility training;Stair training;Gait training;Patient/family education;Manual techniques;Dry needling;Passive range of motion;Taping;Vasopneumatic Device;Spinal Manipulations;Joint Manipulations    PT Next Visit Plan Review HEP and progress PRN, progress LE ROM and core stability for LBP management, sit<>stand and transfer training in parallel bars/RW, gait training with RW and w/c follow.    PT Home Exercise Plan [id number]    Consulted and Agree with Plan of Care Patient             Patient will benefit from skilled therapeutic intervention in order to improve the following deficits and impairments:  Difficulty walking, Decreased range of motion, Decreased activity tolerance, Pain, Decreased balance, Impaired flexibility, Postural dysfunction, Decreased strength, Decreased mobility, Decreased endurance  Visit Diagnosis: Other abnormalities of gait and mobility  Muscle weakness (generalized)  Chronic bilateral low back pain, unspecified whether sciatica present  Chronic pain of left knee  Chronic pain of right knee  Pain in right hip  Pain in left hip     Problem List Patient Active Problem List   Diagnosis Date Noted   Malnutrition of moderate degree 09/11/2020   Loss of weight 09/07/2020   Candidal intertrigo 09/07/2020   Fall at  home 09/06/2020   Hypoglycemia 07/31/2020   Purulent Cellulitis of left hip 07/15/2020   Recurrent falls 07/15/2020   Frailty syndrome in geriatric patient 07/15/2020   Physical deconditioning 07/15/2020   Asymptomatic bacteriuria 07/15/2020   Diabetes mellitus (HCC) 01/28/2012    03/27/2012, PT, DPT, LAT, ATC 08/07/21  3:31 PM Phone: [phone] Fax: [phone]   [facility] [address] [place], [place], [place] Phone: [phone]   Fax:  [phone]  Name: LEOTHA VOELTZ MRN: [name] Date of Birth: 01-05-1956

## 2021-08-12 ENCOUNTER — Other Ambulatory Visit: Payer: Self-pay

## 2021-08-12 ENCOUNTER — Ambulatory Visit: Payer: Medicare Other | Admitting: Physical Therapy

## 2021-08-12 ENCOUNTER — Encounter: Payer: Self-pay | Admitting: Physical Therapy

## 2021-08-12 DIAGNOSIS — M25551 Pain in right hip: Secondary | ICD-10-CM

## 2021-08-12 DIAGNOSIS — M25552 Pain in left hip: Secondary | ICD-10-CM

## 2021-08-12 DIAGNOSIS — M25562 Pain in left knee: Secondary | ICD-10-CM

## 2021-08-12 DIAGNOSIS — R2689 Other abnormalities of gait and mobility: Secondary | ICD-10-CM

## 2021-08-12 DIAGNOSIS — G8929 Other chronic pain: Secondary | ICD-10-CM

## 2021-08-12 DIAGNOSIS — M25561 Pain in right knee: Secondary | ICD-10-CM

## 2021-08-12 DIAGNOSIS — M6281 Muscle weakness (generalized): Secondary | ICD-10-CM

## 2021-08-12 NOTE — Therapy (Signed)
[facility] [address] [place], [place], [address] Phone: [phone]   Fax:  [phone]  Physical Therapy Treatment  Patient Details  Name: [name] MRN: [id number] Date of Birth: Feb 25, 1956 Referring Provider (PT): Fleet Contras, MD   Encounter Date: 08/12/2021   PT End of Session - 08/12/21 1522     Visit Number 26    Number of Visits 30    Date for PT Re-Evaluation 08/15/21    Authorization Type UHC MCR    Authorization Time Period KX modifier    Progress Note Due on Visit 29    PT Start Time 1445    PT Stop Time 1530    PT Time Calculation (min) 45 min    Activity Tolerance Patient limited by pain;Patient tolerated treatment well    Behavior During Therapy [facility] for tasks assessed/performed             Past Medical History:  Diagnosis Date   Cellulitis 07/17/2020   LEFT HIP   Diabetes mellitus    Hyperlipidemia    Hypertension     Past Surgical History:  Procedure Laterality Date   SPINE SURGERY      There were no vitals filed for this visit.   Subjective Assessment - 08/12/21 1455     Subjective Patient reports his back continues to bother him. he still is consisent with his seated exercises at home.    Patient Stated Goals Patient reports he wants to walk again    Currently in Pain? Yes    Pain Score 6     Pain Location Back    Pain Orientation Lower    Pain Descriptors / Indicators Aching;Sharp    Pain Type Chronic pain    Pain Onset More than a month ago    Pain Frequency Constant    Pain Score 4    Pain Location Knee    Pain Orientation Right;Left    Pain Descriptors / Indicators Aching;Sharp    Pain Type Chronic pain    Pain Onset More than a month ago    Pain Frequency Constant                               [facility] Adult PT Treatment/Exercise - 08/12/21 0001       Exercises   Exercises Knee/Hip      Knee/Hip Exercises: Standing   Heel Raises 20 reps     Heel Raises Limitations in // bars with BUE support    Hip Flexion 20 reps    Hip Flexion Limitations alternating march in // bars with BUE support    Hip Abduction 10 reps    Abduction Limitations in // bars with BUE support    Functional Squat 10 reps    Functional Squat Limitations in // bars with BUE support    Other Standing Knee Exercises Standing in // bars with single UE support 5 x 60 seconds   switching which arm is used for support, unable to attain upright posture due to back pain   Other Standing Knee Exercises Walking in // bars x 5 lengths   moderate reliance on UEs for support, unable to attain upright posture due to back pain     Knee/Hip Exercises: Seated   Long Arc Quad 2 sets;20 reps    Long Arc Quad Weight 7 lbs.    Clamshell with TheraBand Green   2 x 20  Hamstring Curl 2 sets;20 reps    Hamstring Limitations green    Sit to Sand 5 reps   using BUE for support with one hand from table and one on RW                   PT Education - 08/12/21 1518     Education Details HEP, trying to do more transfers during the day and sit to stands at [employer]) Educated Patient    Methods Explanation;Demonstration;Verbal cues    Comprehension Verbalized understanding;Returned demonstration;Verbal cues required;Need further instruction              PT Short Term Goals - 07/29/21 1454       PT SHORT TERM GOAL #1   Title Patient will be I with initial HEP to progress with PT    Baseline patient requires cues for HEP    Period Weeks    Status Achieved      PT SHORT TERM GOAL #2   Title Patient will be able to maintain standing position for >/= 10 seconds with min A and use of RW for assist to improve transfer ability    Baseline Patient able to maintain standing using RW > 10 sec with CGA    Time 4    Period Weeks    Status Achieved      PT SHORT TERM GOAL #3   Title Patient will be able to perform stand pivot transfer WC<>mat table using RW  with supervision to improve safety with transfers at home    Baseline supervision for stand pivot transfers    Time 4    Period Weeks    Status Achieved      PT SHORT TERM GOAL #4   Title Patient will exhibit improved knee strength >/= 4/5 MMT to improve stranding ability    Time 4    Period Weeks    Status Achieved      PT SHORT TERM GOAL #5   Title Patient will report </= 5/10 pain level to reduce functional limitations    Baseline Patient reports 5/10 low back pain    Period Weeks    Status Achieved               PT Long Term Goals - 06/20/21 1601       PT LONG TERM GOAL #1   Title Patient will be I with final HEP to maintain progress from PT    Time 8    Period Weeks    Status On-going    Target Date 08/15/21      PT LONG TERM GOAL #2   Title Patient will be able to ambulate >/= 33ft using LRAD and supervision in order to improve household mobility    Time 8    Period Weeks    Status On-going    Target Date 08/15/21      PT LONG TERM GOAL #3   Title Patient will be able to stand >/= 1 minute with LRAD and supervision to improve homemaking ability    Time 8    Status On-going    Target Date 08/15/21      PT LONG TERM GOAL #4   Title Patient will exhibit >/= 4+/5 MMT knee strength to improve ability to stand and walk    Period Weeks    Status On-going    Target Date 08/15/21      PT LONG TERM GOAL #5  Title Patient will be able to perform all transfers without assist or supervision using LRAD in order to improve safety and independence    Time 8    Status On-going    Target Date 08/15/21                   Plan - 08/12/21 1532     Clinical Impression Statement Patient reporting more pain this visit in low back and knees, so limited in walking tolerance more this visit. He was able to perform longer durations of standing in parallel bars and with using single UE support. Overall he seems to be progressing with his standing exercises,  transfers, and walking in the clinic, however this is not translating to increased level of function at home. Patient was highly encouraged to work on increasing transfers and standing/sit<>stands at home. Patient would benefit from continued skilled PT to progress his strength and mobility in order to maximize safe functional mobility and independence at home.    PT Treatment/Interventions ADLs/Self Care Home Management;Aquatic Therapy;Cryotherapy;Electrical Stimulation;Iontophoresis 4mg /ml Dexamethasone;Moist Heat;DME Instruction;Neuromuscular re-education;Balance training;Therapeutic exercise;Therapeutic activities;Functional mobility training;Stair training;Gait training;Patient/family education;Manual techniques;Dry needling;Passive range of motion;Taping;Vasopneumatic Device;Spinal Manipulations;Joint Manipulations    PT Next Visit Plan Review HEP and progress PRN, progress LE ROM and core stability for LBP management, sit<>stand and transfer training in parallel bars/RW, gait training with RW and w/c follow.    PT Home Exercise Plan [id number]    Consulted and Agree with Plan of Care Patient             Patient will benefit from skilled therapeutic intervention in order to improve the following deficits and impairments:  Difficulty walking, Decreased range of motion, Decreased activity tolerance, Pain, Decreased balance, Impaired flexibility, Postural dysfunction, Decreased strength, Decreased mobility, Decreased endurance  Visit Diagnosis: Other abnormalities of gait and mobility  Muscle weakness (generalized)  Chronic bilateral low back pain, unspecified whether sciatica present  Chronic pain of left knee  Chronic pain of right knee  Pain in right hip  Pain in left hip     Problem List Patient Active Problem List   Diagnosis Date Noted   Malnutrition of moderate degree 09/11/2020   Loss of weight 09/07/2020   Candidal intertrigo 09/07/2020   Fall at home 09/06/2020    Hypoglycemia 07/31/2020   Purulent Cellulitis of left hip 07/15/2020   Recurrent falls 07/15/2020   Frailty syndrome in geriatric patient 07/15/2020   Physical deconditioning 07/15/2020   Asymptomatic bacteriuria 07/15/2020   Diabetes mellitus (HCC) 01/28/2012    03/27/2012, PT, DPT, LAT, ATC 08/12/21  3:36 PM Phone: [phone] Fax: [phone]   [facility] [address] [place], [place], [place] Phone: [phone]   Fax:  [phone]  Name: [name] MRN: [name] Date of Birth: 09/28/1956

## 2021-08-14 ENCOUNTER — Other Ambulatory Visit: Payer: Self-pay

## 2021-08-14 ENCOUNTER — Encounter: Payer: Self-pay | Admitting: Physical Therapy

## 2021-08-14 ENCOUNTER — Ambulatory Visit: Payer: Medicare Other | Admitting: Physical Therapy

## 2021-08-14 DIAGNOSIS — R2689 Other abnormalities of gait and mobility: Secondary | ICD-10-CM | POA: Diagnosis not present

## 2021-08-14 DIAGNOSIS — M25552 Pain in left hip: Secondary | ICD-10-CM

## 2021-08-14 DIAGNOSIS — M25561 Pain in right knee: Secondary | ICD-10-CM

## 2021-08-14 DIAGNOSIS — M6281 Muscle weakness (generalized): Secondary | ICD-10-CM

## 2021-08-14 DIAGNOSIS — M25551 Pain in right hip: Secondary | ICD-10-CM

## 2021-08-14 DIAGNOSIS — G8929 Other chronic pain: Secondary | ICD-10-CM

## 2021-08-14 DIAGNOSIS — M25562 Pain in left knee: Secondary | ICD-10-CM

## 2021-08-14 DIAGNOSIS — M545 Low back pain, unspecified: Secondary | ICD-10-CM

## 2021-08-14 NOTE — Therapy (Signed)
[facility] Outpatient Rehabilitation [facility] [address] [place], [place], [address] Phone: [phone]   Fax:  [phone]  Physical Therapy Treatment / ERO  Patient Details  Name: [name] MRN: [id number] Date of Birth: 1956/11/13 Referring Provider (PT): Fleet Contras, MD   Encounter Date: 08/14/2021   PT End of Session - 08/14/21 1451     Visit Number 27    Number of Visits 34    Date for PT Re-Evaluation 09/18/21    Authorization Type UHC MCR    Authorization Time Period KX modifier    Progress Note Due on Visit 29    PT Start Time 1445    PT Stop Time 1530    PT Time Calculation (min) 45 min    Equipment Utilized During Treatment Gait belt    Activity Tolerance Patient limited by pain;Patient tolerated treatment well    Behavior During Therapy [facility] for tasks assessed/performed             Past Medical History:  Diagnosis Date   Cellulitis 07/17/2020   LEFT HIP   Diabetes mellitus    Hyperlipidemia    Hypertension     Past Surgical History:  Procedure Laterality Date   SPINE SURGERY      There were no vitals filed for this visit.   Subjective Assessment - 08/14/21 1624     Subjective Patient reports continued low back and bilateral knee pain that limits his mobility. He performs seated exercises consistently. States he doesn't use a RW at home but does feel he has improve in self care including wiping his bottom while standing at a sink and dressing.    Limitations Standing;Walking;House hold activities    Patient Stated Goals Patient reports he wants to walk again    Currently in Pain? Yes    Pain Location Back    Pain Orientation Lower    Pain Descriptors / Indicators Aching;Sharp    Pain Type Chronic pain    Pain Onset More than a month ago    Pain Frequency Constant    Aggravating Factors  Constant pain, unable to lay on back, standing, walking    Pain Score 3    Pain Location Knee    Pain Orientation Right;Left     Pain Descriptors / Indicators Aching;Sharp    Pain Type Chronic pain    Pain Onset More than a month ago    Pain Frequency Constant    Aggravating Factors  Standing or walking                [facility] PT Assessment - 08/14/21 0001       Assessment   Medical Diagnosis Hip and knee pain    Referring Provider (PT) Fleet Contras, MD      Precautions   Precautions Fall      Restrictions   Weight Bearing Restrictions No      Balance Screen   Has the patient fallen in the past 6 months Yes    How many times? No falls since beginning of therapy    Has the patient had a decrease in activity level because of a fear of falling?  Yes    Is the patient reluctant to leave their home because of a fear of falling?  Yes      Prior Function   Level of Independence Needs assistance with homemaking;Independent with basic ADLs;Requires assistive device for independence      Observation/Other Assessments   Focus on Therapeutic  Outcomes (FOTO)  NA      Strength   Right Hip Flexion 4/5    Right Hip ABduction 4-/5    Left Hip Flexion 3+/5    Left Hip ABduction 3-/5    Right Knee Flexion 4/5    Right Knee Extension 4+/5    Left Knee Flexion 4/5    Left Knee Extension 4/5      Ambulation/Gait   Ambulation/Gait Yes    Ambulation/Gait Assistance 4: Min guard    Assistive device Rolling walker   w/c follow, CGA gait belt for safety without LOB                          [facility] Adult PT Treatment/Exercise - 08/14/21 0001       Ambulation/Gait   Ambulation Distance (Feet) 55 Feet      Exercises   Exercises Knee/Hip      Knee/Hip Exercises: Aerobic   Nustep L7 x 5 min with UE/LE, no rest break today, while taking subjective      Knee/Hip Exercises: Standing   Heel Raises 20 reps    Heel Raises Limitations in // bars with BUE support    Hip Flexion 20 reps    Hip Flexion Limitations alternating march in // bars with BUE support    Hip Abduction 10 reps    Abduction  Limitations in // bars with BUE support    Other Standing Knee Exercises Standing with walker 3 x 60 seconds   CGA gait belt for safety without LOB, unable to attain upright posture due to back pain   Other Standing Knee Exercises Walking in // bars x 5 lengths   moderate reliance on UEs for support, unable to attain upright posture due to back pain     Knee/Hip Exercises: Seated   Long Arc Quad 2 sets;20 reps    Long Arc Quad Weight 7 lbs.    Sit to Sand 2 sets;10 reps   performed at counter sink to practice for home                   PT Education - 08/14/21 1451     Education Details POC update, HEP, trying to do more transfers during the day and sit to stands at [employer]    Person(s) Educated Patient    Methods Explanation;Demonstration;Verbal cues    Comprehension Verbalized understanding;Returned demonstration;Verbal cues required;Need further instruction              PT Short Term Goals - 07/29/21 1454       PT SHORT TERM GOAL #1   Title Patient will be I with initial HEP to progress with PT    Baseline patient requires cues for HEP    Period Weeks    Status Achieved      PT SHORT TERM GOAL #2   Title Patient will be able to maintain standing position for >/= 10 seconds with min A and use of RW for assist to improve transfer ability    Baseline Patient able to maintain standing using RW > 10 sec with CGA    Time 4    Period Weeks    Status Achieved      PT SHORT TERM GOAL #3   Title Patient will be able to perform stand pivot transfer WC<>mat table using RW with supervision to improve safety with transfers at home    Baseline supervision for stand pivot transfers  Time 4    Period Weeks    Status Achieved      PT SHORT TERM GOAL #4   Title Patient will exhibit improved knee strength >/= 4/5 MMT to improve stranding ability    Time 4    Period Weeks    Status Achieved      PT SHORT TERM GOAL #5   Title Patient will report </= 5/10 pain level to  reduce functional limitations    Baseline Patient reports 5/10 low back pain    Period Weeks    Status Achieved               PT Long Term Goals - 08/14/21 1453       PT LONG TERM GOAL #1   Title Patient will be I with final HEP to maintain progress from PT    Baseline patient only performing seated exercises, is not progressing transfers at home    Time 5    Period Weeks    Status On-going    Target Date 09/18/21      PT LONG TERM GOAL #2   Title Patient will be able to ambulate >/= 4ft using LRAD and supervision in order to improve household mobility    Baseline patient requires CGA using gait belt for safety    Time 5    Period Weeks    Status Achieved    Target Date 09/18/21      PT LONG TERM GOAL #3   Title Patient will be able to stand >/= 1 minute with LRAD and supervision to improve homemaking ability    Baseline patient requires CGA using gait belt for safety    Time 5    Period Weeks    Status On-going    Target Date 09/18/21      PT LONG TERM GOAL #4   Title Patient will exhibit >/= 4+/5 MMT knee strength to improve ability to stand and walk    Baseline patient continues to exhibit gross strength deficit    Time 5    Period Weeks    Status On-going    Target Date 09/18/21      PT LONG TERM GOAL #5   Title Patient will be able to perform all transfers without assist or supervision using LRAD in order to improve safety and independence    Baseline patient requires CGA using gait belt for safety    Time 5    Period Weeks    Status On-going    Target Date 09/18/21                   Plan - 08/14/21 1452     Clinical Impression Statement Patient tolerated therapy better this visit with no adverse effects. He demonstrates progress toward goals this visit, demonstrating improved standing and walking tolerance using RW but continues to require CGA with gait belt for safety. Patient continues to report back and knee pain that limit his gross  mobility and this has not improved with therapy. Patient reports improvement in self care and independence at home but states he does not use a RW for any transfers and mainly performs just seated exercises at home. He was highly encouraged to perform sit<>stands and practice transfers as he feels safe to continue progressing his mobility at home. Will re-cert patient POC for 5 more weeks to continue progressing his strength and mobility to improve independence. Patient would benefit from continued skilled PT to progress his strength and  mobility in order to maximize safe functional mobility and independence at home.    PT Frequency 2x / week    PT Duration Other (comment)   5 weeks   PT Treatment/Interventions ADLs/Self Care Home Management;Aquatic Therapy;Cryotherapy;Electrical Stimulation;Iontophoresis 4mg /ml Dexamethasone;Moist Heat;DME Instruction;Neuromuscular re-education;Balance training;Therapeutic exercise;Therapeutic activities;Functional mobility training;Stair training;Gait training;Patient/family education;Manual techniques;Dry needling;Passive range of motion;Taping;Vasopneumatic Device;Spinal Manipulations;Joint Manipulations    PT Next Visit Plan Review HEP and progress PRN, progress LE ROM and core stability for LBP management, sit<>stand and transfer training in parallel bars/RW, gait training with RW and w/c follow.    PT Home Exercise Plan [id number]    Consulted and Agree with Plan of Care Patient             Patient will benefit from skilled therapeutic intervention in order to improve the following deficits and impairments:  Difficulty walking, Decreased range of motion, Decreased activity tolerance, Pain, Decreased balance, Impaired flexibility, Postural dysfunction, Decreased strength, Decreased mobility, Decreased endurance  Visit Diagnosis: Other abnormalities of gait and mobility  Muscle weakness (generalized)  Chronic bilateral low back pain, unspecified whether  sciatica present  Chronic pain of left knee  Chronic pain of right knee  Pain in right hip  Pain in left hip     Problem List Patient Active Problem List   Diagnosis Date Noted   Malnutrition of moderate degree 09/11/2020   Loss of weight 09/07/2020   Candidal intertrigo 09/07/2020   Fall at home 09/06/2020   Hypoglycemia 07/31/2020   Purulent Cellulitis of left hip 07/15/2020   Recurrent falls 07/15/2020   Frailty syndrome in geriatric patient 07/15/2020   Physical deconditioning 07/15/2020   Asymptomatic bacteriuria 07/15/2020   Diabetes mellitus (HCC) 01/28/2012    03/27/2012, PT, DPT, LAT, ATC 08/14/21  4:28 PM Phone: [phone] Fax: [phone]   [facility] [address] [place], [place], [place] Phone: [phone]   Fax:  [phone]  Name: [name] MRN: [name] Date of Birth: 04-01-1956

## 2021-08-19 ENCOUNTER — Ambulatory Visit: Payer: Medicare Other | Admitting: Physical Therapy

## 2021-08-19 ENCOUNTER — Other Ambulatory Visit: Payer: Self-pay

## 2021-08-19 ENCOUNTER — Encounter: Payer: Self-pay | Admitting: Physical Therapy

## 2021-08-19 DIAGNOSIS — M25561 Pain in right knee: Secondary | ICD-10-CM

## 2021-08-19 DIAGNOSIS — G8929 Other chronic pain: Secondary | ICD-10-CM

## 2021-08-19 DIAGNOSIS — R2689 Other abnormalities of gait and mobility: Secondary | ICD-10-CM

## 2021-08-19 DIAGNOSIS — M545 Low back pain, unspecified: Secondary | ICD-10-CM

## 2021-08-19 DIAGNOSIS — M6281 Muscle weakness (generalized): Secondary | ICD-10-CM

## 2021-08-19 DIAGNOSIS — M25551 Pain in right hip: Secondary | ICD-10-CM

## 2021-08-19 DIAGNOSIS — M25562 Pain in left knee: Secondary | ICD-10-CM

## 2021-08-19 DIAGNOSIS — M25552 Pain in left hip: Secondary | ICD-10-CM

## 2021-08-19 NOTE — Therapy (Signed)
[facility] [address] [place], [place], [address] Phone: [phone]   Fax:  [phone]  Physical Therapy Treatment  Patient Details  Name: [name] MRN: [id number] Date of Birth: May 09, 1956 Referring Provider (PT): Fleet Contras, MD   Encounter Date: 08/19/2021   PT End of Session - 08/19/21 1706     Visit Number 28    Number of Visits 34    Date for PT Re-Evaluation 09/18/21    Authorization Type UHC MCR    Authorization Time Period KX modifier    Progress Note Due on Visit 29    PT Start Time 1700    PT Stop Time 1745    PT Time Calculation (min) 45 min    Equipment Utilized During Treatment Gait belt    Activity Tolerance Patient limited by pain;Patient tolerated treatment well    Behavior During Therapy [facility] for tasks assessed/performed             Past Medical History:  Diagnosis Date   Cellulitis 07/17/2020   LEFT HIP   Diabetes mellitus    Hyperlipidemia    Hypertension     Past Surgical History:  Procedure Laterality Date   SPINE SURGERY      There were no vitals filed for this visit.   Subjective Assessment - 08/19/21 1703     Subjective Patient reports continue back and knee pain, also reports the bone chip in his knee is getting bigger. States he has been doing the exercises a little more at home.    Patient Stated Goals Patient reports he wants to walk again    Currently in Pain? Yes    Pain Score 5     Pain Location Back    Pain Orientation Lower    Pain Descriptors / Indicators Aching;Sharp    Pain Type Chronic pain    Pain Onset More than a month ago    Pain Frequency Constant    Aggravating Factors  Constant pain, unable to lay on back, standing, walking    Pain Score 4    Pain Location Knee    Pain Orientation Right;Left    Pain Descriptors / Indicators Aching;Sharp    Pain Type Chronic pain    Pain Onset More than a month ago    Pain Frequency Constant    Aggravating  Factors  Standing or walking                [facility] PT Assessment - 08/19/21 0001       Ambulation/Gait   Ambulation/Gait Yes    Ambulation/Gait Assistance 4: Min guard    Assistive device Rolling walker   w/c follow, CGA gait belt for safety without LOB                          [facility] Adult PT Treatment/Exercise - 08/19/21 0001       Ambulation/Gait   Ambulation Distance (Feet) 60 Feet      Exercises   Exercises Knee/Hip      Knee/Hip Exercises: Aerobic   Nustep L7 x 5 min with UE/LE, no rest break today, while taking subjective      Knee/Hip Exercises: Standing   Heel Raises 20 reps    Heel Raises Limitations in // bars with BUE support    Hip Flexion 20 reps    Hip Flexion Limitations alternating march in // bars with BUE support    Hip Abduction  15 reps    Abduction Limitations in // bars with BUE support    Other Standing Knee Exercises Walking in // bars x 5 lengths   moderate reliance on UEs for support, unable to attain upright posture due to back pain     Knee/Hip Exercises: Seated   Long Arc Quad 2 sets;20 reps    Long Arc Quad Weight 7 lbs.    Clamshell with TheraBand Green   2 x 20   Hamstring Curl 2 sets;20 reps    Hamstring Limitations green    Sit to Sand 2 sets;10 reps   performed at counter                   PT Education - 08/19/21 1706     Education Details HEP    Person(s) Educated Patient    Methods Explanation;Demonstration;Verbal cues    Comprehension Verbalized understanding;Returned demonstration;Need further instruction;Verbal cues required              PT Short Term Goals - 07/29/21 1454       PT SHORT TERM GOAL #1   Title Patient will be I with initial HEP to progress with PT    Baseline patient requires cues for HEP    Period Weeks    Status Achieved      PT SHORT TERM GOAL #2   Title Patient will be able to maintain standing position for >/= 10 seconds with min A and use of RW for assist to  improve transfer ability    Baseline Patient able to maintain standing using RW > 10 sec with CGA    Time 4    Period Weeks    Status Achieved      PT SHORT TERM GOAL #3   Title Patient will be able to perform stand pivot transfer WC<>mat table using RW with supervision to improve safety with transfers at home    Baseline supervision for stand pivot transfers    Time 4    Period Weeks    Status Achieved      PT SHORT TERM GOAL #4   Title Patient will exhibit improved knee strength >/= 4/5 MMT to improve stranding ability    Time 4    Period Weeks    Status Achieved      PT SHORT TERM GOAL #5   Title Patient will report </= 5/10 pain level to reduce functional limitations    Baseline Patient reports 5/10 low back pain    Period Weeks    Status Achieved               PT Long Term Goals - 08/14/21 1453       PT LONG TERM GOAL #1   Title Patient will be I with final HEP to maintain progress from PT    Baseline patient only performing seated exercises, is not progressing transfers at home    Time 5    Period Weeks    Status On-going    Target Date 09/18/21      PT LONG TERM GOAL #2   Title Patient will be able to ambulate >/= 63ft using LRAD and supervision in order to improve household mobility    Baseline patient requires CGA using gait belt for safety    Time 5    Period Weeks    Status Achieved    Target Date 09/18/21      PT LONG TERM GOAL #3   Title Patient will  be able to stand >/= 1 minute with LRAD and supervision to improve homemaking ability    Baseline patient requires CGA using gait belt for safety    Time 5    Period Weeks    Status On-going    Target Date 09/18/21      PT LONG TERM GOAL #4   Title Patient will exhibit >/= 4+/5 MMT knee strength to improve ability to stand and walk    Baseline patient continues to exhibit gross strength deficit    Time 5    Period Weeks    Status On-going    Target Date 09/18/21      PT LONG TERM GOAL #5    Title Patient will be able to perform all transfers without assist or supervision using LRAD in order to improve safety and independence    Baseline patient requires CGA using gait belt for safety    Time 5    Period Weeks    Status On-going    Target Date 09/18/21                   Plan - 08/19/21 1706     Clinical Impression Statement Patient tolerated therapy well with no adverse effects. Therapy continued focus on progressing strength, standing tolerance to activity, and walking ability. Patient continues to report low back and knee pain limiting his standing and walking. He did demonstrate improved walking distance this visit and continues to only require CGA for safety. Patient would benefit from continued skilled PT to progress his strength and mobility in order to maximize safe functional mobility and independence at home.    PT Treatment/Interventions ADLs/Self Care Home Management;Aquatic Therapy;Cryotherapy;Electrical Stimulation;Iontophoresis 4mg /ml Dexamethasone;Moist Heat;DME Instruction;Neuromuscular re-education;Balance training;Therapeutic exercise;Therapeutic activities;Functional mobility training;Stair training;Gait training;Patient/family education;Manual techniques;Dry needling;Passive range of motion;Taping;Vasopneumatic Device;Spinal Manipulations;Joint Manipulations    PT Next Visit Plan Review HEP and progress PRN, progress LE ROM and core stability for LBP management, sit<>stand and transfer training in parallel bars/RW, gait training with RW and w/c follow.    PT Home Exercise Plan [id number]    Consulted and Agree with Plan of Care Patient             Patient will benefit from skilled therapeutic intervention in order to improve the following deficits and impairments:  Difficulty walking, Decreased range of motion, Decreased activity tolerance, Pain, Decreased balance, Impaired flexibility, Postural dysfunction, Decreased strength, Decreased mobility,  Decreased endurance  Visit Diagnosis: Other abnormalities of gait and mobility  Muscle weakness (generalized)  Chronic bilateral low back pain, unspecified whether sciatica present  Chronic pain of left knee  Chronic pain of right knee  Pain in right hip  Pain in left hip     Problem List Patient Active Problem List   Diagnosis Date Noted   Malnutrition of moderate degree 09/11/2020   Loss of weight 09/07/2020   Candidal intertrigo 09/07/2020   Fall at home 09/06/2020   Hypoglycemia 07/31/2020   Purulent Cellulitis of left hip 07/15/2020   Recurrent falls 07/15/2020   Frailty syndrome in geriatric patient 07/15/2020   Physical deconditioning 07/15/2020   Asymptomatic bacteriuria 07/15/2020   Diabetes mellitus (HCC) 01/28/2012    03/27/2012, PT, DPT, LAT, ATC 08/19/21  5:46 PM Phone: [phone] Fax: [phone]   [facility] [address] [place], [place], [place] Phone: [phone]   Fax:  [phone]  Name: [name] MRN: [name] Date of Birth: [birth year]

## 2021-09-05 ENCOUNTER — Encounter: Payer: Self-pay | Admitting: Physical Therapy

## 2021-09-05 ENCOUNTER — Ambulatory Visit: Payer: Medicare Other | Attending: Internal Medicine | Admitting: Physical Therapy

## 2021-09-05 ENCOUNTER — Other Ambulatory Visit: Payer: Self-pay

## 2021-09-05 DIAGNOSIS — G8929 Other chronic pain: Secondary | ICD-10-CM | POA: Insufficient documentation

## 2021-09-05 DIAGNOSIS — M25562 Pain in left knee: Secondary | ICD-10-CM | POA: Diagnosis present

## 2021-09-05 DIAGNOSIS — M25561 Pain in right knee: Secondary | ICD-10-CM | POA: Diagnosis present

## 2021-09-05 DIAGNOSIS — M25551 Pain in right hip: Secondary | ICD-10-CM | POA: Diagnosis present

## 2021-09-05 DIAGNOSIS — M6281 Muscle weakness (generalized): Secondary | ICD-10-CM | POA: Insufficient documentation

## 2021-09-05 DIAGNOSIS — M545 Low back pain, unspecified: Secondary | ICD-10-CM | POA: Diagnosis present

## 2021-09-05 DIAGNOSIS — M25552 Pain in left hip: Secondary | ICD-10-CM | POA: Diagnosis present

## 2021-09-05 DIAGNOSIS — R2689 Other abnormalities of gait and mobility: Secondary | ICD-10-CM | POA: Diagnosis not present

## 2021-09-05 NOTE — Therapy (Signed)
[facility] [address] [place], [place], [address] Phone: [phone]   Fax:  [phone]  Physical Therapy Treatment  Patient Details  Name: [name] MRN: [id number] Date of Birth: 1956-01-05 Referring Provider (PT): Fleet Contras, MD   Encounter Date: 09/05/2021   PT End of Session - 09/05/21 1354     Visit Number 29    Number of Visits 34    Date for PT Re-Evaluation 09/18/21    Authorization Type UHC MCR    Authorization Time Period KX modifier    Progress Note Due on Visit 39    PT Start Time 1300    PT Stop Time 1345    PT Time Calculation (min) 45 min    Equipment Utilized During Treatment Gait belt    Activity Tolerance Patient limited by pain;Patient tolerated treatment well    Behavior During Therapy [facility] for tasks assessed/performed             Past Medical History:  Diagnosis Date   Cellulitis 07/17/2020   LEFT HIP   Diabetes mellitus    Hyperlipidemia    Hypertension     Past Surgical History:  Procedure Laterality Date   SPINE SURGERY      There were no vitals filed for this visit.   Subjective Assessment - 09/05/21 1423     Subjective Patient reports continued back and knee pain. Reports doing the seated exercises at home. Still not using a RW at home for transfers.    Patient Stated Goals Patient reports he wants to walk again    Currently in Pain? Yes    Pain Score 5     Pain Location Back    Pain Orientation Lower    Pain Descriptors / Indicators Aching;Sharp    Pain Type Chronic pain    Pain Onset More than a month ago    Pain Frequency Constant    Aggravating Factors  Constant pain, unable to lay on back, standing, walking    Pain Score 3    Pain Location Knee    Pain Orientation Right;Left    Pain Descriptors / Indicators Aching;Sharp    Pain Type Chronic pain    Pain Onset More than a month ago    Pain Frequency Constant    Aggravating Factors  Standing or walking                 [facility] PT Assessment - 09/05/21 0001       Assessment   Medical Diagnosis Hip and knee pain    Referring Provider (PT) Fleet Contras, MD      Precautions   Precautions Fall      Restrictions   Weight Bearing Restrictions No      Balance Screen   How many times? No falls since beginning of therapy      Prior Function   Level of Independence Needs assistance with homemaking;Independent with basic ADLs;Requires assistive device for independence      Observation/Other Assessments   Focus on Therapeutic Outcomes (FOTO)  NA      Transfers   Sit to Stand 5: Supervision    Stand Pivot Transfers 5: Supervision      Ambulation/Gait   Ambulation/Gait Yes    Ambulation/Gait Assistance 5: Supervision    Ambulation Distance (Feet) 72 Feet    Assistive device Rolling walker   w/c follow, close supervision for safety  [facility] Adult PT Treatment/Exercise - 09/05/21 0001       Exercises   Exercises Knee/Hip      Knee/Hip Exercises: Aerobic   Nustep L7 x 5 min with UE/LE, no rest break, while taking subjective      Knee/Hip Exercises: Standing   Heel Raises 20 reps    Heel Raises Limitations in // bars with BUE support    Hip Flexion 20 reps    Hip Flexion Limitations alternating march in // bars with BUE support    Hip Abduction 15 reps    Abduction Limitations in // bars with BUE support    Other Standing Knee Exercises Standing with walker 3 x 60 seconds   close supervision, unable to attain upright posture due to back pain   Other Standing Knee Exercises Walking in // bars x 3 lengths back and forth without rest   moderate reliance on UEs for support, unable to attain upright posture due to back pain     Knee/Hip Exercises: Seated   Long Arc Quad 2 sets;20 reps    Long Arc Quad Weight 7 lbs.    Clamshell with TheraBand Blue   2 x 20   Hamstring Curl 2 sets;20 reps    Hamstring Limitations green    Sit to [employer] 10  reps   performed at counter                    PT Education - 09/05/21 1353     Education Details HEP    Person(s) Educated Patient    Methods Explanation;Demonstration;Verbal cues    Comprehension Verbalized understanding;Returned demonstration;Verbal cues required;Need further instruction              PT Short Term Goals - 07/29/21 1454       PT SHORT TERM GOAL #1   Title Patient will be I with initial HEP to progress with PT    Baseline patient requires cues for HEP    Period Weeks    Status Achieved      PT SHORT TERM GOAL #2   Title Patient will be able to maintain standing position for >/= 10 seconds with min A and use of RW for assist to improve transfer ability    Baseline Patient able to maintain standing using RW > 10 sec with CGA    Time 4    Period Weeks    Status Achieved      PT SHORT TERM GOAL #3   Title Patient will be able to perform stand pivot transfer WC<>mat table using RW with supervision to improve safety with transfers at home    Baseline supervision for stand pivot transfers    Time 4    Period Weeks    Status Achieved      PT SHORT TERM GOAL #4   Title Patient will exhibit improved knee strength >/= 4/5 MMT to improve stranding ability    Time 4    Period Weeks    Status Achieved      PT SHORT TERM GOAL #5   Title Patient will report </= 5/10 pain level to reduce functional limitations    Baseline Patient reports 5/10 low back pain    Period Weeks    Status Achieved               PT Long Term Goals - 08/14/21 1453       PT LONG TERM GOAL #1   Title Patient will be  I with final HEP to maintain progress from PT    Baseline patient only performing seated exercises, is not progressing transfers at home    Time 5    Period Weeks    Status On-going    Target Date 09/18/21      PT LONG TERM GOAL #2   Title Patient will be able to ambulate >/= 90ft using LRAD and supervision in order to improve household mobility     Baseline patient requires CGA using gait belt for safety    Time 5    Period Weeks    Status Achieved    Target Date 09/18/21      PT LONG TERM GOAL #3   Title Patient will be able to stand >/= 1 minute with LRAD and supervision to improve homemaking ability    Baseline patient requires CGA using gait belt for safety    Time 5    Period Weeks    Status On-going    Target Date 09/18/21      PT LONG TERM GOAL #4   Title Patient will exhibit >/= 4+/5 MMT knee strength to improve ability to stand and walk    Baseline patient continues to exhibit gross strength deficit    Time 5    Period Weeks    Status On-going    Target Date 09/18/21      PT LONG TERM GOAL #5   Title Patient will be able to perform all transfers without assist or supervision using LRAD in order to improve safety and independence    Baseline patient requires CGA using gait belt for safety    Time 5    Period Weeks    Status On-going    Target Date 09/18/21                   Plan - 09/05/21 1354     Clinical Impression Statement Patient tolerated therapy well with no adverse effects. He was able to perform all transfers and ambulation using RW and only supervision this visit demonstrating improvement. Continued with strengthening in parallel bars and seated using resistance to improve transfers and walking, as well as continue progression of standing tolerance. He continues to report increased back and knee pain with therapy that limits walking and standing. Patient would benefit from continued skilled PT to progress his strength and mobility in order to maximize safe functional mobility and independence at home    PT Treatment/Interventions ADLs/Self Care Home Management;Aquatic Therapy;Cryotherapy;Electrical Stimulation;Iontophoresis 4mg /ml Dexamethasone;Moist Heat;DME Instruction;Neuromuscular re-education;Balance training;Therapeutic exercise;Therapeutic activities;Functional mobility training;Stair  training;Gait training;Patient/family education;Manual techniques;Dry needling;Passive range of motion;Taping;Vasopneumatic Device;Spinal Manipulations;Joint Manipulations    PT Next Visit Plan Review HEP and progress PRN, progress LE ROM and core stability for LBP management, sit<>stand and transfer training in parallel bars/RW, gait training with RW and w/c follow.    PT Home Exercise Plan [id number]    Consulted and Agree with Plan of Care Patient             Patient will benefit from skilled therapeutic intervention in order to improve the following deficits and impairments:  Difficulty walking, Decreased range of motion, Decreased activity tolerance, Pain, Decreased balance, Impaired flexibility, Postural dysfunction, Decreased strength, Decreased mobility, Decreased endurance  Visit Diagnosis: Other abnormalities of gait and mobility  Muscle weakness (generalized)  Chronic bilateral low back pain, unspecified whether sciatica present  Chronic pain of left knee  Chronic pain of right knee  Pain in right hip  Pain in left  hip     Problem List Patient Active Problem List   Diagnosis Date Noted   Malnutrition of moderate degree 09/11/2020   Loss of weight 09/07/2020   Candidal intertrigo 09/07/2020   Fall at home 09/06/2020   Hypoglycemia 07/31/2020   Purulent Cellulitis of left hip 07/15/2020   Recurrent falls 07/15/2020   Frailty syndrome in geriatric patient 07/15/2020   Physical deconditioning 07/15/2020   Asymptomatic bacteriuria 07/15/2020   Diabetes mellitus (HCC) 01/28/2012    Rosana Hoes, PT, DPT, LAT, ATC 09/05/21  2:45 PM Phone: [phone] Fax: [phone]   [facility] [address] [place], [place], [address] Phone: [phone]   Fax:  [phone]  Name: [name] MRN: [id number] Date of Birth: May 28, 1956

## 2021-09-06 ENCOUNTER — Ambulatory Visit: Payer: Medicare Other | Admitting: Physical Therapy

## 2021-09-06 ENCOUNTER — Other Ambulatory Visit: Payer: Self-pay

## 2021-09-06 ENCOUNTER — Encounter: Payer: Self-pay | Admitting: Physical Therapy

## 2021-09-06 DIAGNOSIS — M6281 Muscle weakness (generalized): Secondary | ICD-10-CM

## 2021-09-06 DIAGNOSIS — R2689 Other abnormalities of gait and mobility: Secondary | ICD-10-CM

## 2021-09-06 DIAGNOSIS — M25552 Pain in left hip: Secondary | ICD-10-CM

## 2021-09-06 DIAGNOSIS — M25551 Pain in right hip: Secondary | ICD-10-CM

## 2021-09-06 DIAGNOSIS — G8929 Other chronic pain: Secondary | ICD-10-CM

## 2021-09-06 DIAGNOSIS — M545 Low back pain, unspecified: Secondary | ICD-10-CM

## 2021-09-06 DIAGNOSIS — M25562 Pain in left knee: Secondary | ICD-10-CM

## 2021-09-06 NOTE — Therapy (Signed)
[facility] [address] [place], [place], [address] Phone: [phone]   Fax:  [phone]  Physical Therapy Treatment  Patient Details  Name: [name] MRN: [id number] Date of Birth: 1956/11/02 Referring Provider (PT): Fleet Contras, MD   Encounter Date: 09/06/2021   PT End of Session - 09/06/21 1142     Visit Number 30    Number of Visits 34    Date for PT Re-Evaluation 09/18/21    Authorization Type UHC MCR    Authorization Time Period KX modifier    Progress Note Due on Visit 39    PT Start Time 1131    PT Stop Time 1215    PT Time Calculation (min) 44 min    Equipment Utilized During Treatment Gait belt    Activity Tolerance Patient limited by pain;Patient tolerated treatment well    Behavior During Therapy [facility] for tasks assessed/performed             Past Medical History:  Diagnosis Date   Cellulitis 07/17/2020   LEFT HIP   Diabetes mellitus    Hyperlipidemia    Hypertension     Past Surgical History:  Procedure Laterality Date   SPINE SURGERY      There were no vitals filed for this visit.   Subjective Assessment - 09/06/21 1141     Subjective Patient reports his back and knees are sore from yesterday.    Patient Stated Goals Patient reports he wants to walk again    Currently in Pain? Yes    Pain Score 5     Pain Location Back    Pain Orientation Lower    Pain Descriptors / Indicators Aching;Sore;Sharp    Pain Type Chronic pain    Pain Onset More than a month ago    Pain Frequency Constant    Pain Score 3    Pain Location Knee    Pain Orientation Right;Left    Pain Descriptors / Indicators Aching;Sore;Sharp    Pain Type Chronic pain    Pain Onset More than a month ago    Pain Frequency Constant                               [facility] Adult PT Treatment/Exercise - 09/06/21 0001       Transfers   Sit to Stand 5: Supervision    Stand Pivot Transfers 5:  Supervision      Ambulation/Gait   Ambulation/Gait Yes    Ambulation/Gait Assistance 5: Supervision    Assistive device Rolling walker   w/c follow, close supervision for safety   Gait Comments Close supervision, moderate reliance on UEs for support, unable to attain upright posture due to back pain, knees remain flexed      Exercises   Exercises Knee/Hip      Knee/Hip Exercises: Aerobic   Nustep L7 x 5 min with UE/LE, no rest break, while taking subjective      Knee/Hip Exercises: Standing   Heel Raises 2 sets;20 reps    Heel Raises Limitations in // bars with BUE support    Hip Flexion 2 sets;20 reps    Hip Flexion Limitations alternating march in // bars with BUE support    Hip Abduction 2 sets;10 reps    Abduction Limitations in // bars with BUE support    Other Standing Knee Exercises Standing with walker 3 x 60 seconds   close supervision,  moderate reliance on UEs for support, unable to attain upright posture due to back pain, knees remain flexed   Other Standing Knee Exercises Walking in // bars back and forth x 2 lengths without rest x 5   moderate reliance on UEs for support, unable to attain upright posture due to back pain, knees remain flexed     Knee/Hip Exercises: Seated   Sit to Sand 10 reps   performed at counter                    PT Education - 09/06/21 1142     Education Details HEP    Person(s) Educated Patient    Methods Explanation;Demonstration;Verbal cues    Comprehension Verbalized understanding;Returned demonstration;Verbal cues required;Need further instruction              PT Short Term Goals - 07/29/21 1454       PT SHORT TERM GOAL #1   Title Patient will be I with initial HEP to progress with PT    Baseline patient requires cues for HEP    Period Weeks    Status Achieved      PT SHORT TERM GOAL #2   Title Patient will be able to maintain standing position for >/= 10 seconds with min A and use of RW for assist to improve  transfer ability    Baseline Patient able to maintain standing using RW > 10 sec with CGA    Time 4    Period Weeks    Status Achieved      PT SHORT TERM GOAL #3   Title Patient will be able to perform stand pivot transfer WC<>mat table using RW with supervision to improve safety with transfers at home    Baseline supervision for stand pivot transfers    Time 4    Period Weeks    Status Achieved      PT SHORT TERM GOAL #4   Title Patient will exhibit improved knee strength >/= 4/5 MMT to improve stranding ability    Time 4    Period Weeks    Status Achieved      PT SHORT TERM GOAL #5   Title Patient will report </= 5/10 pain level to reduce functional limitations    Baseline Patient reports 5/10 low back pain    Period Weeks    Status Achieved               PT Long Term Goals - 08/14/21 1453       PT LONG TERM GOAL #1   Title Patient will be I with final HEP to maintain progress from PT    Baseline patient only performing seated exercises, is not progressing transfers at home    Time 5    Period Weeks    Status On-going    Target Date 09/18/21      PT LONG TERM GOAL #2   Title Patient will be able to ambulate >/= 50ft using LRAD and supervision in order to improve household mobility    Baseline patient requires CGA using gait belt for safety    Time 5    Period Weeks    Status Achieved    Target Date 09/18/21      PT LONG TERM GOAL #3   Title Patient will be able to stand >/= 1 minute with LRAD and supervision to improve homemaking ability    Baseline patient requires CGA using gait belt for safety  Time 5    Period Weeks    Status On-going    Target Date 09/18/21      PT LONG TERM GOAL #4   Title Patient will exhibit >/= 4+/5 MMT knee strength to improve ability to stand and walk    Baseline patient continues to exhibit gross strength deficit    Time 5    Period Weeks    Status On-going    Target Date 09/18/21      PT LONG TERM GOAL #5   Title  Patient will be able to perform all transfers without assist or supervision using LRAD in order to improve safety and independence    Baseline patient requires CGA using gait belt for safety    Time 5    Period Weeks    Status On-going    Target Date 09/18/21                   Plan - 09/06/21 1143     Clinical Impression Statement Patient tolerated therapy well with no adverse effects. Continued with supervision level transfers and walking this visit. Continues with progression of strengthening and focused more and standing exercises this visit. Back and knee pain continue to be a limiting factor with standing tolerance. Patient would benefit from continued skilled PT to progress his strength and mobility in order to maximize safe functional mobility and independence at home.    PT Treatment/Interventions ADLs/Self Care Home Management;Aquatic Therapy;Cryotherapy;Electrical Stimulation;Iontophoresis 4mg /ml Dexamethasone;Moist Heat;DME Instruction;Neuromuscular re-education;Balance training;Therapeutic exercise;Therapeutic activities;Functional mobility training;Stair training;Gait training;Patient/family education;Manual techniques;Dry needling;Passive range of motion;Taping;Vasopneumatic Device;Spinal Manipulations;Joint Manipulations    PT Next Visit Plan Review HEP and progress PRN, progress LE ROM and core stability for LBP management, sit<>stand and transfer training in parallel bars/RW, gait training with RW and w/c follow.    PT Home Exercise Plan [email or web address]    Consulted and Agree with Plan of Care Patient             Patient will benefit from skilled therapeutic intervention in order to improve the following deficits and impairments:  Difficulty walking, Decreased range of motion, Decreased activity tolerance, Pain, Decreased balance, Impaired flexibility, Postural dysfunction, Decreased strength, Decreased mobility, Decreased endurance  Visit Diagnosis: Other abnormalities  of gait and mobility  Muscle weakness (generalized)  Chronic bilateral low back pain, unspecified whether sciatica present  Chronic pain of left knee  Chronic pain of right knee  Pain in right hip  Pain in left hip     Problem List Patient Active Problem List   Diagnosis Date Noted   Malnutrition of moderate degree 09/11/2020   Loss of weight 09/07/2020   Candidal intertrigo 09/07/2020   Fall at home 09/06/2020   Hypoglycemia 07/31/2020   Purulent Cellulitis of left hip 07/15/2020   Recurrent falls 07/15/2020   Frailty syndrome in geriatric patient 07/15/2020   Physical deconditioning 07/15/2020   Asymptomatic bacteriuria 07/15/2020   Diabetes mellitus (HCC) 01/28/2012    03/27/2012, PT, DPT, LAT, ATC 09/06/21  12:15 PM Phone: [phone] Fax: [phone]   [facility] [address] [place], [place], [place] Phone: [phone]   Fax:  [phone]  Name: [name] MRN: [name] Date of Birth: 11/27/1956

## 2021-09-09 ENCOUNTER — Ambulatory Visit: Payer: Medicare Other | Admitting: Physical Therapy

## 2021-09-09 ENCOUNTER — Other Ambulatory Visit: Payer: Self-pay

## 2021-09-09 ENCOUNTER — Encounter: Payer: Self-pay | Admitting: Physical Therapy

## 2021-09-09 DIAGNOSIS — M25561 Pain in right knee: Secondary | ICD-10-CM

## 2021-09-09 DIAGNOSIS — R2689 Other abnormalities of gait and mobility: Secondary | ICD-10-CM

## 2021-09-09 DIAGNOSIS — M6281 Muscle weakness (generalized): Secondary | ICD-10-CM

## 2021-09-09 DIAGNOSIS — M25551 Pain in right hip: Secondary | ICD-10-CM

## 2021-09-09 DIAGNOSIS — M25552 Pain in left hip: Secondary | ICD-10-CM

## 2021-09-09 DIAGNOSIS — G8929 Other chronic pain: Secondary | ICD-10-CM

## 2021-09-09 DIAGNOSIS — M545 Low back pain, unspecified: Secondary | ICD-10-CM

## 2021-09-09 DIAGNOSIS — M25562 Pain in left knee: Secondary | ICD-10-CM

## 2021-09-09 NOTE — Therapy (Signed)
[facility] [address] [place], [place], [address] Phone: [phone]   Fax:  [phone]  Physical Therapy Treatment  Patient Details  Name: [name] MRN: [id number] Date of Birth: 10-29-1956 Referring Provider (PT): Fleet Contras, MD   Encounter Date: 09/09/2021   PT End of Session - 09/09/21 1451     Visit Number 31    Number of Visits 34    Date for PT Re-Evaluation 09/18/21    Authorization Type UHC MCR    Authorization Time Period KX modifier    Progress Note Due on Visit 39    PT Start Time 1445    PT Stop Time 1530    PT Time Calculation (min) 45 min    Equipment Utilized During Treatment Gait belt    Activity Tolerance Patient limited by pain;Patient tolerated treatment well    Behavior During Therapy [facility] for tasks assessed/performed             Past Medical History:  Diagnosis Date   Cellulitis 07/17/2020   LEFT HIP   Diabetes mellitus    Hyperlipidemia    Hypertension     Past Surgical History:  Procedure Laterality Date   SPINE SURGERY      There were no vitals filed for this visit.   Subjective Assessment - 09/09/21 1449     Subjective Patient reports he is doing about the same, still having low back and knee pain. He does not use his walker at home and mainly just transfers to/from bed to chair and stands when having to wipe himself.    Patient Stated Goals Patient reports he wants to walk again    Currently in Pain? Yes    Pain Score 5     Pain Location Back    Pain Orientation Lower    Pain Descriptors / Indicators Aching;Sore;Sharp    Pain Type Chronic pain    Pain Onset More than a month ago    Pain Frequency Constant    Aggravating Factors  Constant pain, unable to lay on back, standing, walking    Pain Score 3    Pain Location Knee    Pain Orientation Right;Left    Pain Descriptors / Indicators Aching;Sore;Sharp    Pain Type Chronic pain    Pain Onset More than a month  ago    Pain Frequency Constant    Aggravating Factors  Standing or walking                [facility] PT Assessment - 09/09/21 0001       Ambulation/Gait   Ambulation/Gait Yes    Ambulation/Gait Assistance 5: Supervision    Ambulation Distance (Feet) 40 Feet   2 bouts   Assistive device Rolling walker   w/c follow, close supervision for safety   Gait Comments Close supervision, moderate reliance on UEs for support, unable to attain upright posture due to back pain, knees remain flexed                           [facility] Adult PT Treatment/Exercise - 09/09/21 0001       Exercises   Exercises Knee/Hip      Knee/Hip Exercises: Aerobic   Nustep L7 x 5 min with UE/LE, no rest break, while taking subjective      Knee/Hip Exercises: Standing   Heel Raises 2 sets;20 reps    Heel Raises Limitations one set in // bars  with BUE support, one set at counter    Hip Flexion 2 sets;20 reps    Hip Flexion Limitations alternating march, one set in // bars with BUE support, one set at counter    Hip Abduction 2 sets;15 reps    Abduction Limitations one set in // bars with BUE support, one set at counter    Other Standing Knee Exercises Standing with walker 3 x 60 seconds   performed at counter, close supervision, moderate reliance on UEs for support, unable to attain upright posture due to back pain, knees remain flexed   Other Standing Knee Exercises Walking in // bars back and forth x 2 lengths without rest x 5   moderate reliance on UEs for support, unable to attain upright posture due to back pain, knees remain flexed     Knee/Hip Exercises: Seated   Long Arc Quad 2 sets;20 reps    Long Arc Quad Weight 7 lbs.    Sit to [employer] 10 reps   performed at counter                    PT Education - 09/09/21 1450     Education Details HEP update, trying to use walker in home to perform more transfers and short walks    Person(s) Educated Patient    Methods  Explanation;Demonstration;Verbal cues    Comprehension Verbalized understanding;Returned demonstration;Verbal cues required;Need further instruction              PT Short Term Goals - 07/29/21 1454       PT SHORT TERM GOAL #1   Title Patient will be I with initial HEP to progress with PT    Baseline patient requires cues for HEP    Period Weeks    Status Achieved      PT SHORT TERM GOAL #2   Title Patient will be able to maintain standing position for >/= 10 seconds with min A and use of RW for assist to improve transfer ability    Baseline Patient able to maintain standing using RW > 10 sec with CGA    Time 4    Period Weeks    Status Achieved      PT SHORT TERM GOAL #3   Title Patient will be able to perform stand pivot transfer WC<>mat table using RW with supervision to improve safety with transfers at home    Baseline supervision for stand pivot transfers    Time 4    Period Weeks    Status Achieved      PT SHORT TERM GOAL #4   Title Patient will exhibit improved knee strength >/= 4/5 MMT to improve stranding ability    Time 4    Period Weeks    Status Achieved      PT SHORT TERM GOAL #5   Title Patient will report </= 5/10 pain level to reduce functional limitations    Baseline Patient reports 5/10 low back pain    Period Weeks    Status Achieved               PT Long Term Goals - 08/14/21 1453       PT LONG TERM GOAL #1   Title Patient will be I with final HEP to maintain progress from PT    Baseline patient only performing seated exercises, is not progressing transfers at home    Time 5    Period Weeks    Status On-going  Target Date 09/18/21      PT LONG TERM GOAL #2   Title Patient will be able to ambulate >/= 101ft using LRAD and supervision in order to improve household mobility    Baseline patient requires CGA using gait belt for safety    Time 5    Period Weeks    Status Achieved    Target Date 09/18/21      PT LONG TERM GOAL #3    Title Patient will be able to stand >/= 1 minute with LRAD and supervision to improve homemaking ability    Baseline patient requires CGA using gait belt for safety    Time 5    Period Weeks    Status On-going    Target Date 09/18/21      PT LONG TERM GOAL #4   Title Patient will exhibit >/= 4+/5 MMT knee strength to improve ability to stand and walk    Baseline patient continues to exhibit gross strength deficit    Time 5    Period Weeks    Status On-going    Target Date 09/18/21      PT LONG TERM GOAL #5   Title Patient will be able to perform all transfers without assist or supervision using LRAD in order to improve safety and independence    Baseline patient requires CGA using gait belt for safety    Time 5    Period Weeks    Status On-going    Target Date 09/18/21                   Plan - 09/09/21 1451     Clinical Impression Statement Patient tolerated therapy well with no adverse effects. He continues to report persistent low back and knee pain, requiring rest break for ambulation this visit at 40 ft but he was able to perform 2 bouts. He continues to only require supervision using RW for ambulation but with wheelchair follow. Updated his HEP to include standing exercises at counter and encouraged patient to perform transfers and short bouts of walking with RW as long as he feels safe or has someone at home with him. Patient would benefit from continued skilled PT to progress his strength and mobility in order to maximize safe functional mobility and independence at home.    PT Treatment/Interventions ADLs/Self Care Home Management;Aquatic Therapy;Cryotherapy;Electrical Stimulation;Iontophoresis 4mg /ml Dexamethasone;Moist Heat;DME Instruction;Neuromuscular re-education;Balance training;Therapeutic exercise;Therapeutic activities;Functional mobility training;Stair training;Gait training;Patient/family education;Manual techniques;Dry needling;Passive range of  motion;Taping;Vasopneumatic Device;Spinal Manipulations;Joint Manipulations    PT Next Visit Plan Review HEP and progress PRN, progress LE ROM and core stability for LBP management, sit<>stand and transfer training in parallel bars/RW, gait training with RW and w/c follow.    PT Home Exercise Plan [email or web address]    Consulted and Agree with Plan of Care Patient             Patient will benefit from skilled therapeutic intervention in order to improve the following deficits and impairments:  Difficulty walking, Decreased range of motion, Decreased activity tolerance, Pain, Decreased balance, Impaired flexibility, Postural dysfunction, Decreased strength, Decreased mobility, Decreased endurance  Visit Diagnosis: Other abnormalities of gait and mobility  Muscle weakness (generalized)  Chronic bilateral low back pain, unspecified whether sciatica present  Chronic pain of left knee  Chronic pain of right knee  Pain in right hip  Pain in left hip     Problem List Patient Active Problem List   Diagnosis Date Noted   Malnutrition of moderate degree  09/11/2020   Loss of weight 09/07/2020   Candidal intertrigo 09/07/2020   Fall at home 09/06/2020   Hypoglycemia 07/31/2020   Purulent Cellulitis of left hip 07/15/2020   Recurrent falls 07/15/2020   Frailty syndrome in geriatric patient 07/15/2020   Physical deconditioning 07/15/2020   Asymptomatic bacteriuria 07/15/2020   Diabetes mellitus (HCC) 01/28/2012    Rosana Hoes, PT, DPT, LAT, ATC 09/09/21  3:31 PM Phone: [phone] Fax: [phone]   [facility] [address] [place], [place], [address] Phone: [phone]   Fax:  [phone]  Name: [name] MRN: [id number] Date of Birth: 12-22-1956

## 2021-09-09 NOTE — Patient Instructions (Signed)
Access Code: [email or web address] URL: [email or web address] Date: 09/09/2021 Prepared by: Rosana Hoes  Exercises Seated Hip Abduction with Resistance - 2 x daily - 7 x weekly - 2 sets - 15 reps Seated March with Resistance - 2 x daily - 7 x weekly - 2 sets - 20 reps Seated Knee Extension with Anchored Resistance - 2 x daily - 7 x weekly - 2 sets - 15 reps Seated Knee Flexion with Anchored Resistance - 2 x daily - 7 x weekly - 2 sets - 15 reps Sit to Stand with Counter Support - 2 x daily - 7 x weekly - 2 sets - 10 reps Heel Raises with Counter Support - 2 x daily - 7 x weekly - 2 sets - 20 reps Standing Hip Abduction with Counter Support - 2 x daily - 7 x weekly - 2 sets - 10 reps Standing March with Counter Support - 2 x daily - 7 x weekly - 2 sets - 20 reps

## 2021-09-11 ENCOUNTER — Encounter: Payer: Medicare Other | Admitting: Physical Therapy

## 2021-09-12 ENCOUNTER — Ambulatory Visit: Payer: Medicare Other | Admitting: Physical Therapy

## 2021-09-13 ENCOUNTER — Telehealth: Payer: Self-pay | Admitting: Physical Therapy

## 2021-09-13 NOTE — Telephone Encounter (Signed)
Attempted to contact patient due to missed appointment. No answer and unable to leave VM.  Rosana Hoes, PT, DPT, LAT, ATC 09/13/21  8:37 AM Phone: [phone] Fax: [phone]

## 2021-09-16 ENCOUNTER — Encounter: Payer: Self-pay | Admitting: Physical Therapy

## 2021-09-16 ENCOUNTER — Ambulatory Visit: Payer: Medicare Other | Admitting: Physical Therapy

## 2021-09-16 ENCOUNTER — Other Ambulatory Visit: Payer: Self-pay

## 2021-09-16 DIAGNOSIS — M25562 Pain in left knee: Secondary | ICD-10-CM

## 2021-09-16 DIAGNOSIS — R2689 Other abnormalities of gait and mobility: Secondary | ICD-10-CM

## 2021-09-16 DIAGNOSIS — M25551 Pain in right hip: Secondary | ICD-10-CM

## 2021-09-16 DIAGNOSIS — M6281 Muscle weakness (generalized): Secondary | ICD-10-CM

## 2021-09-16 DIAGNOSIS — M25552 Pain in left hip: Secondary | ICD-10-CM

## 2021-09-16 DIAGNOSIS — M25561 Pain in right knee: Secondary | ICD-10-CM

## 2021-09-16 DIAGNOSIS — G8929 Other chronic pain: Secondary | ICD-10-CM

## 2021-09-16 NOTE — Therapy (Signed)
[facility] Outpatient Rehabilitation [facility] [address] [place], [place], [address] Phone: [phone]   Fax:  [phone]  Physical Therapy Treatment  Patient Details  Name: [name] MRN: [id number] Date of Birth: 1956-09-16 Referring Provider (PT): Fleet Contras, MD   Encounter Date: 09/16/2021   PT End of Session - 09/16/21 1415     Visit Number 32    Number of Visits 34    Date for PT Re-Evaluation 09/18/21    Authorization Type UHC MCR    Authorization Time Period KX modifier    Progress Note Due on Visit 39    PT Start Time 1430    PT Stop Time 1515    PT Time Calculation (min) 45 min    Equipment Utilized During Treatment Gait belt    Activity Tolerance Patient limited by pain;Patient tolerated treatment well    Behavior During Therapy [facility] for tasks assessed/performed             Past Medical History:  Diagnosis Date   Cellulitis 07/17/2020   LEFT HIP   Diabetes mellitus    Hyperlipidemia    Hypertension     Past Surgical History:  Procedure Laterality Date   SPINE SURGERY      There were no vitals filed for this visit.   Subjective Assessment - 09/16/21 1448     Subjective Patient reports he missed last appointment due to transportation not getting him. He continues to have low back and bilateral knee pain.    Patient Stated Goals Patient reports he wants to walk again    Currently in Pain? Yes    Pain Score 5     Pain Location Back    Pain Orientation Lower    Pain Descriptors / Indicators Aching;Sore;Sharp    Pain Type Chronic pain    Pain Onset More than a month ago    Pain Frequency Constant    Aggravating Factors  Constant pain, unable to lay on back, standing, walking    Pain Score 4    Pain Location Knee    Pain Orientation Right;Left    Pain Descriptors / Indicators Aching;Sharp;Sore    Pain Type Chronic pain    Pain Onset More than a month ago    Pain Frequency Constant    Aggravating Factors  Standing  or walking                [facility] PT Assessment - 09/16/21 0001       Assessment   Medical Diagnosis Hip and knee pain    Referring Provider (PT) Fleet Contras, MD      Transfers   Sit to Stand 5: Supervision    Stand Pivot Transfers 5: Supervision      Ambulation/Gait   Ambulation/Gait Yes    Ambulation/Gait Assistance 5: Supervision    Ambulation Distance (Feet) 100 Feet   2 bouts of 50 ft with seated rest break   Assistive device Rolling walker   w/c follow, close supervision for safety                          [facility] Adult PT Treatment/Exercise - 09/16/21 0001       Exercises   Exercises Knee/Hip      Knee/Hip Exercises: Aerobic   Nustep L7 x 5 min with UE/LE, no rest break, while taking subjective      Knee/Hip Exercises: Standing   Heel Raises 2 sets;20 reps  Heel Raises Limitations one set in // bars with BUE support, one set at counter    Hip Flexion 2 sets;20 reps    Hip Flexion Limitations alternating march, one set in // bars with BUE support, one set at counter    Hip Abduction 2 sets;15 reps    Abduction Limitations one set in // bars with BUE support, one set at counter    Other Standing Knee Exercises Standing at counter 4 x 60 seconds   close supervision, moderate reliance on UEs for support, unable to attain upright posture due to back pain, knees remain flexed   Other Standing Knee Exercises Walking in // bars back and forth x 2 lengths without rest x 3      Knee/Hip Exercises: Seated   Long Arc Quad 2 sets;20 reps    Long Arc Quad Weight 7 lbs.    Sit to Sand 2 sets;10 reps   performed at counter                    PT Education - 09/16/21 1415     Education Details HEP    Person(s) Educated Patient    Methods Explanation;Demonstration;Verbal cues    Comprehension Verbalized understanding;Returned demonstration;Verbal cues required;Need further instruction              PT Short Term Goals - 07/29/21 1454        PT SHORT TERM GOAL #1   Title Patient will be I with initial HEP to progress with PT    Baseline patient requires cues for HEP    Period Weeks    Status Achieved      PT SHORT TERM GOAL #2   Title Patient will be able to maintain standing position for >/= 10 seconds with min A and use of RW for assist to improve transfer ability    Baseline Patient able to maintain standing using RW > 10 sec with CGA    Time 4    Period Weeks    Status Achieved      PT SHORT TERM GOAL #3   Title Patient will be able to perform stand pivot transfer WC<>mat table using RW with supervision to improve safety with transfers at home    Baseline supervision for stand pivot transfers    Time 4    Period Weeks    Status Achieved      PT SHORT TERM GOAL #4   Title Patient will exhibit improved knee strength >/= 4/5 MMT to improve stranding ability    Time 4    Period Weeks    Status Achieved      PT SHORT TERM GOAL #5   Title Patient will report </= 5/10 pain level to reduce functional limitations    Baseline Patient reports 5/10 low back pain    Period Weeks    Status Achieved               PT Long Term Goals - 08/14/21 1453       PT LONG TERM GOAL #1   Title Patient will be I with final HEP to maintain progress from PT    Baseline patient only performing seated exercises, is not progressing transfers at home    Time 5    Period Weeks    Status On-going    Target Date 09/18/21      PT LONG TERM GOAL #2   Title Patient will be able to ambulate >/= 31ft using  LRAD and supervision in order to improve household mobility    Baseline patient requires CGA using gait belt for safety    Time 5    Period Weeks    Status Achieved    Target Date 09/18/21      PT LONG TERM GOAL #3   Title Patient will be able to stand >/= 1 minute with LRAD and supervision to improve homemaking ability    Baseline patient requires CGA using gait belt for safety    Time 5    Period Weeks    Status  On-going    Target Date 09/18/21      PT LONG TERM GOAL #4   Title Patient will exhibit >/= 4+/5 MMT knee strength to improve ability to stand and walk    Baseline patient continues to exhibit gross strength deficit    Time 5    Period Weeks    Status On-going    Target Date 09/18/21      PT LONG TERM GOAL #5   Title Patient will be able to perform all transfers without assist or supervision using LRAD in order to improve safety and independence    Baseline patient requires CGA using gait belt for safety    Time 5    Period Weeks    Status On-going    Target Date 09/18/21                   Plan - 09/16/21 1416     Clinical Impression Statement Patient tolerated therapy well with no adverse effects. Continued focus on strengthening and standing exercises at the counter to practice for patient to perform HEP at home. He continues to report low back and knee pain limit his standing and walking ability with more knee pain this visit, but does demonstrate continued improvement in walking distance and standing time. Plan for discharge at next visit. Patient would benefit from continued skilled PT to progress his strength and mobility in order to maximize safe functional mobility and independence at home.    PT Treatment/Interventions ADLs/Self Care Home Management;Aquatic Therapy;Cryotherapy;Electrical Stimulation;Iontophoresis 4mg /ml Dexamethasone;Moist Heat;DME Instruction;Neuromuscular re-education;Balance training;Therapeutic exercise;Therapeutic activities;Functional mobility training;Stair training;Gait training;Patient/family education;Manual techniques;Dry needling;Passive range of motion;Taping;Vasopneumatic Device;Spinal Manipulations;Joint Manipulations    PT Next Visit Plan Review HEP to ensure independence with planned discharge, continue standing strengthening and ambulation    PT Home Exercise Plan [email or web address]    Consulted and Agree with Plan of Care Patient              Patient will benefit from skilled therapeutic intervention in order to improve the following deficits and impairments:  Difficulty walking, Decreased range of motion, Decreased activity tolerance, Pain, Decreased balance, Impaired flexibility, Postural dysfunction, Decreased strength, Decreased mobility, Decreased endurance  Visit Diagnosis: Other abnormalities of gait and mobility  Muscle weakness (generalized)  Chronic bilateral low back pain, unspecified whether sciatica present  Chronic pain of left knee  Chronic pain of right knee  Pain in right hip  Pain in left hip     Problem List Patient Active Problem List   Diagnosis Date Noted   Malnutrition of moderate degree 09/11/2020   Loss of weight 09/07/2020   Candidal intertrigo 09/07/2020   Fall at home 09/06/2020   Hypoglycemia 07/31/2020   Purulent Cellulitis of left hip 07/15/2020   Recurrent falls 07/15/2020   Frailty syndrome in geriatric patient 07/15/2020   Physical deconditioning 07/15/2020   Asymptomatic bacteriuria 07/15/2020   Diabetes mellitus (HCC) 01/28/2012  Rosana Hoes, PT, DPT, LAT, ATC 09/16/21  3:30 PM Phone: [phone] Fax: [phone]   [facility] Outpatient Rehabilitation [facility] [address] [place], [place], [address] Phone: [phone]   Fax:  [phone]  Name: [name] MRN: [id number] Date of Birth: [birth year]

## 2021-09-18 ENCOUNTER — Other Ambulatory Visit: Payer: Self-pay

## 2021-09-18 ENCOUNTER — Ambulatory Visit: Payer: Medicare Other | Admitting: Physical Therapy

## 2021-09-18 ENCOUNTER — Encounter: Payer: Self-pay | Admitting: Physical Therapy

## 2021-09-18 DIAGNOSIS — M25552 Pain in left hip: Secondary | ICD-10-CM

## 2021-09-18 DIAGNOSIS — M25551 Pain in right hip: Secondary | ICD-10-CM

## 2021-09-18 DIAGNOSIS — M6281 Muscle weakness (generalized): Secondary | ICD-10-CM

## 2021-09-18 DIAGNOSIS — R2689 Other abnormalities of gait and mobility: Secondary | ICD-10-CM | POA: Diagnosis not present

## 2021-09-18 DIAGNOSIS — G8929 Other chronic pain: Secondary | ICD-10-CM

## 2021-09-18 NOTE — Patient Instructions (Signed)
Access Code: [email or web address] URL: [email or web address] Date: 09/18/2021 Prepared by: Rosana Hoes  Exercises Seated Hip Abduction with Resistance - 2 x daily - 7 x weekly - 2 sets - 15 reps Seated March with Resistance - 2 x daily - 7 x weekly - 2 sets - 20 reps Seated Knee Extension with Anchored Resistance - 2 x daily - 7 x weekly - 2 sets - 15 reps Seated Knee Flexion with Anchored Resistance - 2 x daily - 7 x weekly - 2 sets - 15 reps Sit to Stand with Counter Support - 2 x daily - 7 x weekly - 2 sets - 10 reps Heel Raises with Counter Support - 2 x daily - 7 x weekly - 2 sets - 20 reps Standing Hip Abduction with Counter Support - 2 x daily - 7 x weekly - 2 sets - 10 reps Standing March with Counter Support - 2 x daily - 7 x weekly - 2 sets - 20 reps

## 2021-09-18 NOTE — Therapy (Signed)
[facility] Outpatient Rehabilitation [facility] [address] [place], [place], [address] Phone: [phone]   Fax:  [phone]  Physical Therapy Treatment / Discharge  Patient Details  Name: [name] MRN: [id number] Date of Birth: [birth year] Referring Provider (PT): Fleet Contras, MD   Encounter Date: 09/18/2021   PT End of Session - 09/18/21 1454     Visit Number 33    Number of Visits 34    Date for PT Re-Evaluation 09/18/21    Authorization Type UHC MCR    Authorization Time Period KX modifier    Progress Note Due on Visit 39    PT Start Time 1445    PT Stop Time 1530    PT Time Calculation (min) 45 min    Equipment Utilized During Treatment Gait belt    Activity Tolerance Patient limited by pain;Patient tolerated treatment well    Behavior During Therapy [facility] for tasks assessed/performed             Past Medical History:  Diagnosis Date   Cellulitis 07/17/2020   LEFT HIP   Diabetes mellitus    Hyperlipidemia    Hypertension     Past Surgical History:  Procedure Laterality Date   SPINE SURGERY      There were no vitals filed for this visit.   Subjective Assessment - 09/18/21 1439     Subjective Patient reports he is doing about the same with his pain level. His knees and low back still are very painful with no improvement with therapy, and he has been doing the exercises at home to work on his standing and transfer ability. He still does not use a walker at home, he does note improvement in ability to stand at the sink and he plans to start tranferring to his walk in shower to the tub seat.    Limitations Standing;Walking;House hold activities    How long can you stand comfortably? 2-3 minutes    How long can you walk comfortably? 1-2 minutes    Patient Stated Goals Patient reports he wants to walk again    Currently in Pain? Yes    Pain Score 5     Pain Location Back    Pain Orientation Lower    Pain Descriptors / Indicators  Aching;Sore;Sharp    Pain Type Chronic pain    Pain Onset More than a month ago    Pain Frequency Constant    Aggravating Factors  Constant pain, unable to lay on back, standing, walking    Pain Relieving Factors Nothing    Effect of Pain on Daily Activities Patient limited with all mobility    Pain Score 3    Pain Location Knee    Pain Orientation Right;Left    Pain Descriptors / Indicators Aching;Sharp;Sore    Pain Type Chronic pain    Pain Onset More than a month ago    Pain Frequency Constant    Aggravating Factors  Standing or walking    Pain Relieving Factors Nothing    Effect of Pain on Daily Activities Patient limited with all mobility                [facility] PT Assessment - 09/18/21 0001       Assessment   Medical Diagnosis Hip and knee pain    Referring Provider (PT) Fleet Contras, MD      Precautions   Precautions Fall      Restrictions   Weight Bearing Restrictions No  Balance Screen   Has the patient fallen in the past 6 months Yes    How many times? No falls since beginning therapy per patient    Has the patient had a decrease in activity level because of a fear of falling?  Yes    Is the patient reluctant to leave their home because of a fear of falling?  Yes      Prior Function   Level of Independence Needs assistance with homemaking;Requires assistive device for independence;Independent with basic ADLs    Leisure None reported      Cognition   Overall Cognitive Status Within Functional Limits for tasks assessed      Observation/Other Assessments   Focus on Therapeutic Outcomes (FOTO)  NA      Strength   Overall Strength Comments Strength assessment performed in seated position    Right Hip Flexion 4/5    Right Hip ABduction 4-/5    Left Hip Flexion 3+/5    Left Hip ABduction 3-/5    Right Knee Flexion 4/5    Right Knee Extension 4+/5    Left Knee Flexion 4/5    Left Knee Extension 4/5      Transfers   Sit to Stand 5: Supervision     Sit to Stand Details (indicate cue type and reason) using RW    Stand Pivot Transfers 5: Supervision    Stand Pivot Transfer Details (indicate cue type and reason) using RW      Ambulation/Gait   Ambulation/Gait Yes    Ambulation/Gait Assistance 5: Supervision    Assistive device Rolling walker   w/c follow, close supervision for safety   Gait Comments Moderate reliance on UEs for support, unable to attain upright posture due to back pain, knees remain flexed                           [facility] Adult PT Treatment/Exercise - 09/18/21 0001       Self-Care   Self-Care Other Self-Care Comments    Other Self-Care Comments  POC discharge, see patient education      Exercises   Exercises Knee/Hip      Knee/Hip Exercises: Standing   Heel Raises 20 reps    Heel Raises Limitations at counter with BUE support    Hip Flexion 20 reps    Hip Flexion Limitations at counter with BUE support    Hip Abduction 20 reps    Abduction Limitations at counter with BUE support    Other Standing Knee Exercises Standing with RW x 60 sec   close supervision, moderate reliance on UEs for support, unable to attain upright posture due to back pain, knees remain flexed   Other Standing Knee Exercises Walking in // bars back and forth x 2 lengths without rest x 3      Knee/Hip Exercises: Seated   Long Arc Quad 2 sets;20 reps    Long Arc Quad Weight 7 lbs.    Sit to [employer] 10 reps   performed at counter                    PT Education - 09/18/21 1441     Education Details POC discharge, HEP, continuing to work on standing tolerance, using walker in home for transfers and short walks as he feels comfortable    Person(s) Educated Patient    Methods Explanation    Comprehension Verbalized understanding  PT Short Term Goals - 07/29/21 1454       PT SHORT TERM GOAL #1   Title Patient will be I with initial HEP to progress with PT    Baseline patient requires cues for  HEP    Period Weeks    Status Achieved      PT SHORT TERM GOAL #2   Title Patient will be able to maintain standing position for >/= 10 seconds with min A and use of RW for assist to improve transfer ability    Baseline Patient able to maintain standing using RW > 10 sec with CGA    Time 4    Period Weeks    Status Achieved      PT SHORT TERM GOAL #3   Title Patient will be able to perform stand pivot transfer WC<>mat table using RW with supervision to improve safety with transfers at home    Baseline supervision for stand pivot transfers    Time 4    Period Weeks    Status Achieved      PT SHORT TERM GOAL #4   Title Patient will exhibit improved knee strength >/= 4/5 MMT to improve stranding ability    Time 4    Period Weeks    Status Achieved      PT SHORT TERM GOAL #5   Title Patient will report </= 5/10 pain level to reduce functional limitations    Baseline Patient reports 5/10 low back pain    Period Weeks    Status Achieved               PT Long Term Goals - 09/18/21 1504       PT LONG TERM GOAL #1   Title Patient will be I with final HEP to maintain progress from PT    Baseline patient reports independence with HEP at home    Time 5    Period Weeks    Status Achieved      PT LONG TERM GOAL #2   Title Patient will be able to ambulate >/= 62ft using LRAD and supervision in order to improve household mobility    Baseline patient able to ambulate using rolling walker 40+ ft with supervision    Time 5    Period Weeks    Status Achieved      PT LONG TERM GOAL #3   Title Patient will be able to stand >/= 1 minute with LRAD and supervision to improve homemaking ability    Baseline patient able to stand 1+ minutes with rolling walker and supervisionb    Time 5    Period Weeks    Status Achieved      PT LONG TERM GOAL #4   Title Patient will exhibit >/= 4+/5 MMT knee strength to improve ability to stand and walk    Baseline patient continues to exhibit  gross strength deficit    Time 5    Period Weeks    Status On-going      PT LONG TERM GOAL #5   Title Patient will be able to perform all transfers without assist or supervision using LRAD in order to improve safety and independence    Baseline patient able to perform stand pivot transfers using rolling walker and supervision    Time 5    Period Weeks    Status Achieved                   Plan - 09/18/21  1456     Clinical Impression Statement Patient tolerated therapy well with no adverse effects. Overall patient has progressed with his standing and walking ability using a rollling walker, but patient reports he continues to use the wheelchair for all mobility and still only stands at the sink to perform cleaning. He does report he performs his seated and standing HEP daily and feels his standing ability has improved. Patient will be discharged from PT as he is not progressing with his home function and continues to be limited due to persistent knee and low back pain    Examination-Activity Limitations Bathing;Locomotion Level;Transfers;Carry;Squat;Stairs;Dressing;Hygiene/Grooming;Lift;Toileting;Stand;Bed Mobility    Examination-Participation Restrictions Meal Prep;Cleaning;Community Activity;Driving;Shop;Laundry;Yard Work    PT Treatment/Interventions ADLs/Self Care Home Management;Aquatic Therapy;Cryotherapy;Electrical Stimulation;Iontophoresis 4mg /ml Dexamethasone;Moist Heat;DME Instruction;Neuromuscular re-education;Balance training;Therapeutic exercise;Therapeutic activities;Functional mobility training;Stair training;Gait training;Patient/family education;Manual techniques;Dry needling;Passive range of motion;Taping;Vasopneumatic Device;Spinal Manipulations;Joint Manipulations    PT Next Visit Plan NA - discharge    PT Home Exercise Plan [email or web address]    Consulted and Agree with Plan of Care Patient             Patient will benefit from skilled therapeutic intervention in order  to improve the following deficits and impairments:  Difficulty walking, Decreased range of motion, Decreased activity tolerance, Pain, Decreased balance, Impaired flexibility, Postural dysfunction, Decreased strength, Decreased mobility, Decreased endurance  Visit Diagnosis: Other abnormalities of gait and mobility  Muscle weakness (generalized)  Chronic bilateral low back pain, unspecified whether sciatica present  Chronic pain of left knee  Chronic pain of right knee  Pain in right hip  Pain in left hip     Problem List Patient Active Problem List   Diagnosis Date Noted   Malnutrition of moderate degree 09/11/2020   Loss of weight 09/07/2020   Candidal intertrigo 09/07/2020   Fall at home 09/06/2020   Hypoglycemia 07/31/2020   Purulent Cellulitis of left hip 07/15/2020   Recurrent falls 07/15/2020   Frailty syndrome in geriatric patient 07/15/2020   Physical deconditioning 07/15/2020   Asymptomatic bacteriuria 07/15/2020   Diabetes mellitus ([place]) 01/28/2012    Hilda Blades, PT, DPT, LAT, ATC 09/18/21  3:32 PM Phone: [phone] Fax: [facility] [address] [place], [place], [address] Phone: [phone]   Fax:  [phone]  Name: [name] MRN: [id number] Date of Birth: July 24, 1956   PHYSICAL THERAPY DISCHARGE SUMMARY  Visits from Start of Care: 25  Current functional level related to goals / functional outcomes: See above   Remaining deficits: See above   Education / Equipment: HEP   Patient agrees to discharge. Patient goals were partially met. Patient is being discharged due to lack of progress.

## 2022-02-07 IMAGING — CT CT HIP*L* W/CM
2 of 3 series · 15 of 46 positions shown, 17 images · IV contrast (agent unspecified)
Comparison: Radiographs 07/15/2020 and 06/12/2008.

CONTRAST:  100mL OMNIPAQUE IOHEXOL 300 MG/ML  SOLN

CLINICAL DATA: Left-sided hip pain for 3 months. History of
acetabular reconstruction. Soft tissue infection suspected.

EXAM:
CT OF THE LOWER LEFT EXTREMITY WITH CONTRAST
TECHNIQUE: Multidetector CT imaging of the left hip was performed according to
the standard protocol following intravenous contrast administration.

[Series 5: soft tissue · axial · 0.52mm/px · z∈[-435,-121]mm · 12 of 181 slices shown, 14 images]
[im 12/181  soft-tissue]
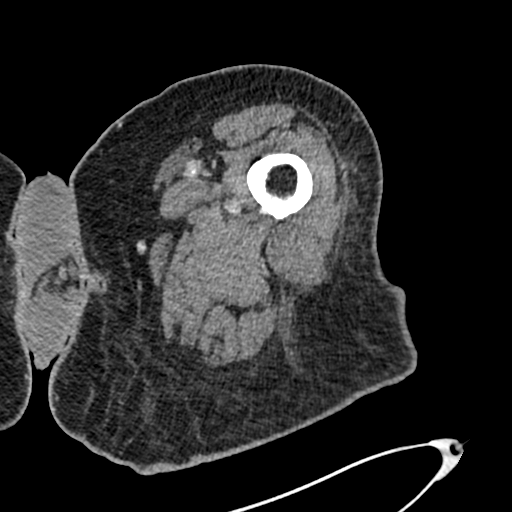
[im 12/181  bone]
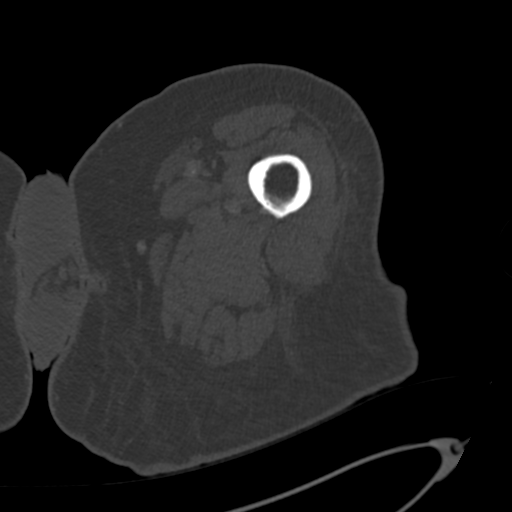
[im 24/181  soft-tissue]
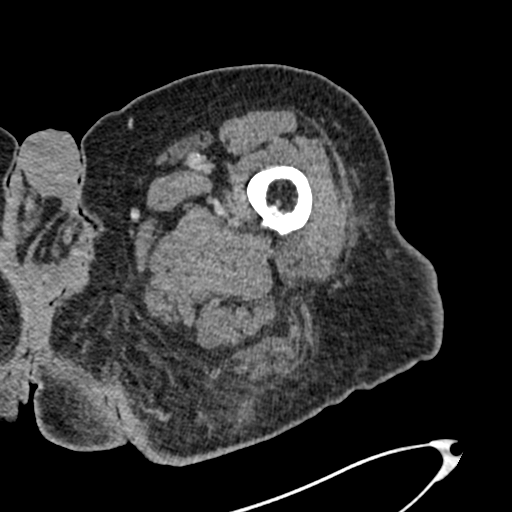
[im 41/181  soft-tissue]
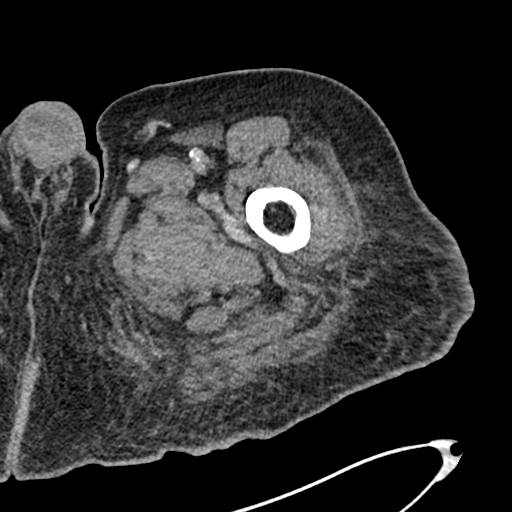
[im 53/181  soft-tissue]
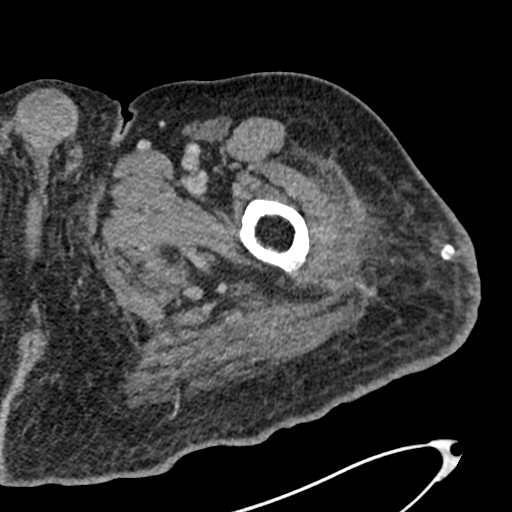
[im 70/181  soft-tissue]
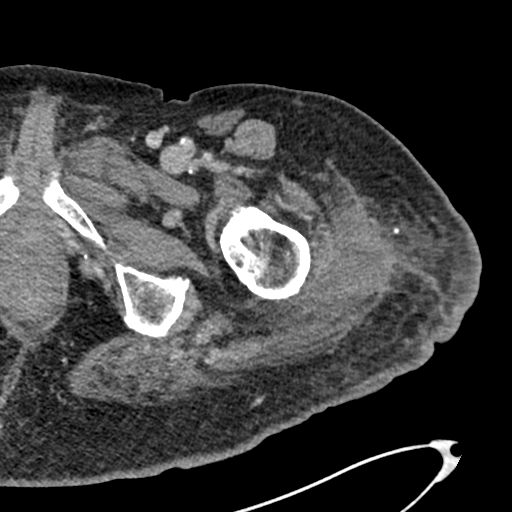
[im 82/181  soft-tissue]
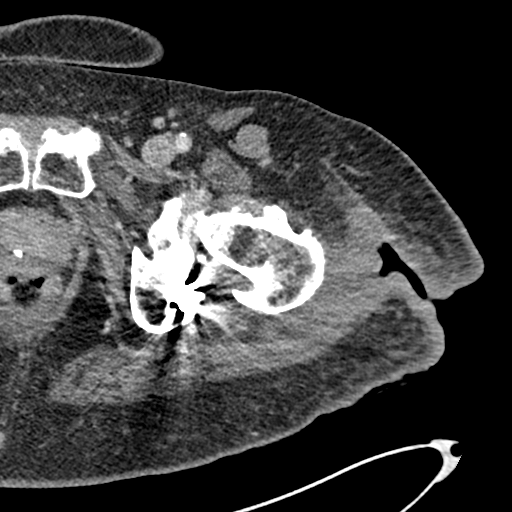
[im 99/181  soft-tissue]
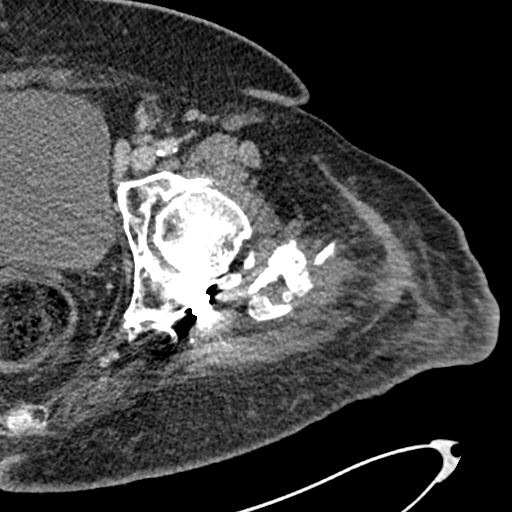
[im 111/181  soft-tissue]
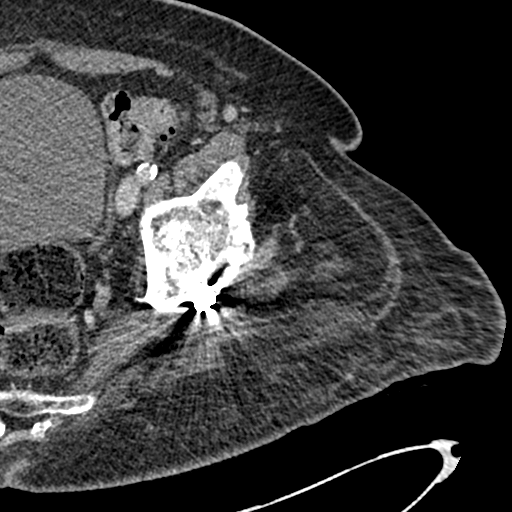
[im 128/181  soft-tissue]
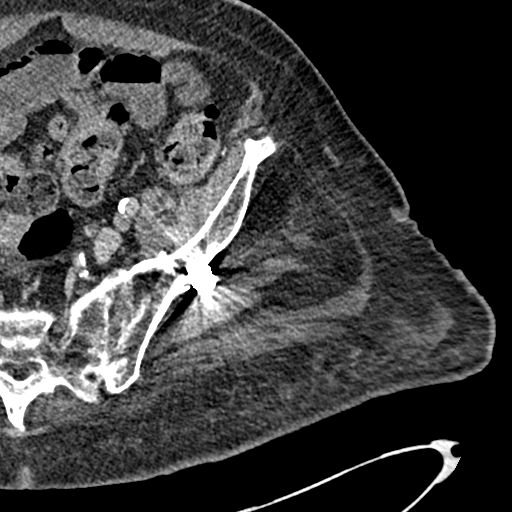
[im 128/181  bone]
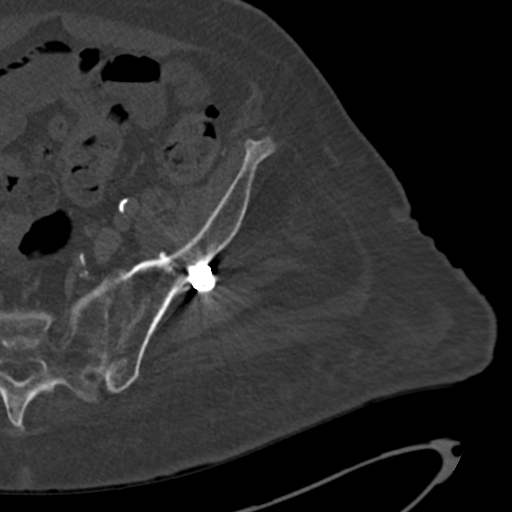
[im 140/181  soft-tissue]
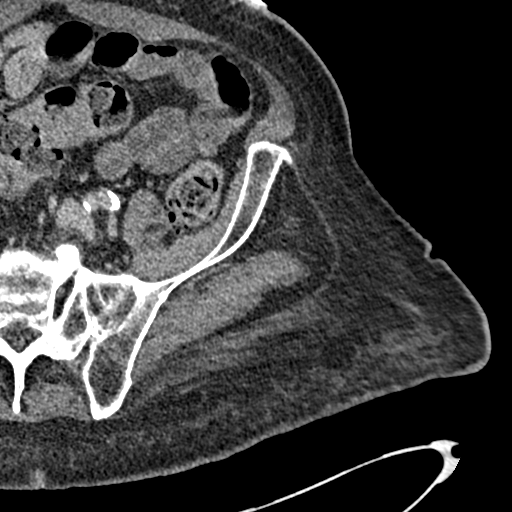
[im 157/181  soft-tissue]
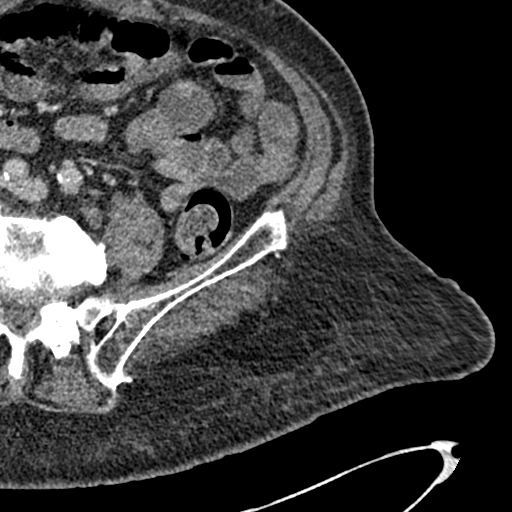
[im 169/181  soft-tissue]
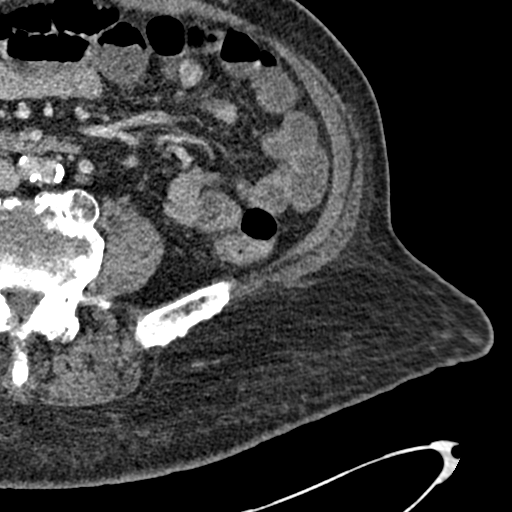

[Series 6: cor soft · coronal · 0.51mm/px · 3 of 147 slices shown]
[im 49/147  soft-tissue]
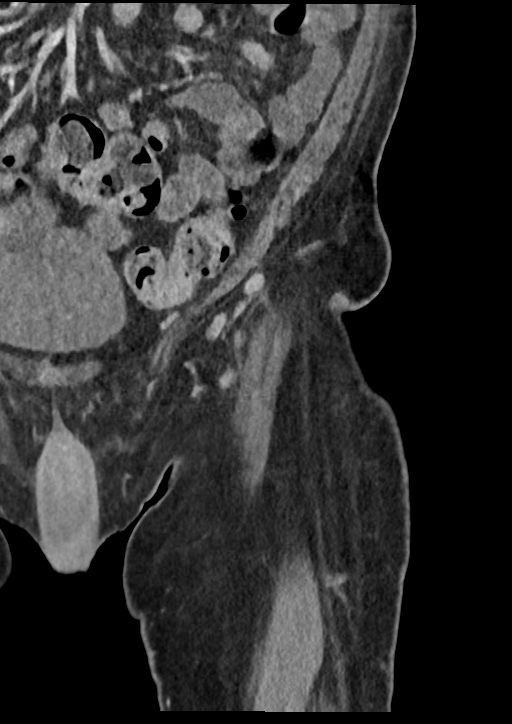
[im 65/147  soft-tissue]
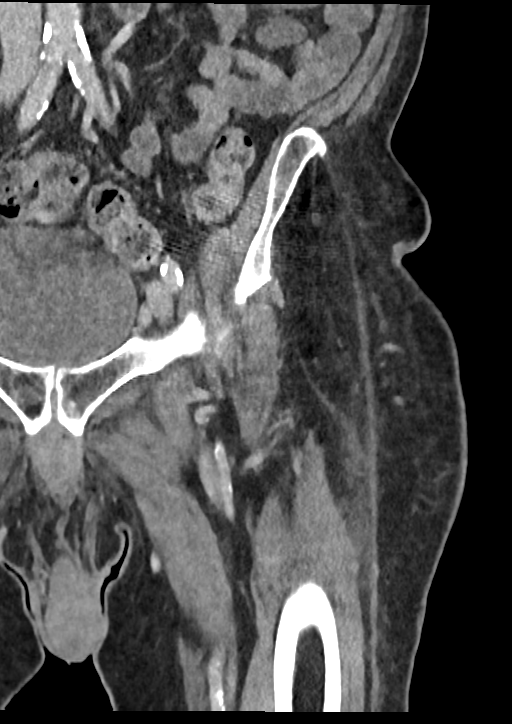
[im 82/147  soft-tissue]
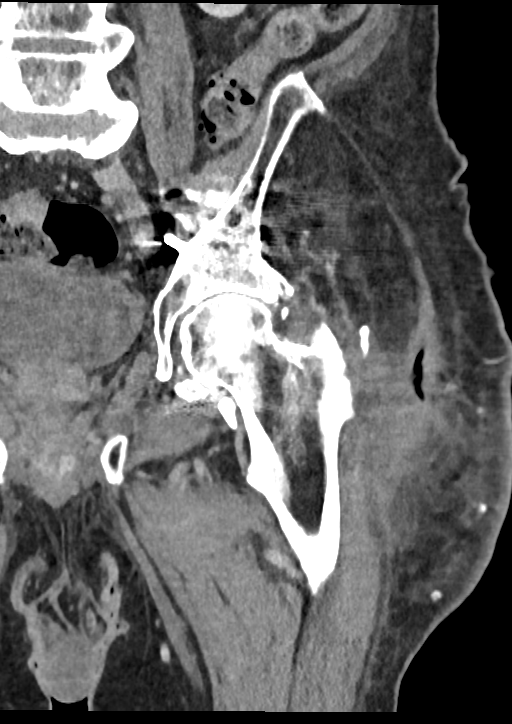

[15 of 46 positions shown; findings below may reference images not displayed]

FINDINGS: Bones/Joint/Cartilage

Status post remote posterior plate and screw left acetabular
reconstruction, present on the 4228 radiographs. The hardware
appears intact without loosening. There is posttraumatic deformity
of the posterior wall and column of the left acetabulum with mild
articular surface irregularity and secondary left hip degenerative
changes. There is fragmented heterotopic ossification adjacent to
the greater trochanter. There is a small left hip joint effusion
with probable intra-articular loose bodies. No evidence of acute
fracture, dislocation, femoral head avascular necrosis or acute
osteomyelitis. The left sacroiliac joint is ankylosed. There is
moderate lower lumbar spondylosis.

Ligaments

Suboptimally assessed by CT.

Muscles and Tendons

Marked atrophy of the left gluteus medius and minimus muscles and
the tensor fascia lata muscle.

Soft tissues

There is a large area of soft tissue ulceration lateral to the left
greater trochanter with air extending approximately 7 cm deep to the
skin surface. This air nearly abuts the greater trochanter. There
are inflammatory changes surrounding this open wound with muscular
thickening and an ill-defined fluid collection inferiorly, measuring
up to 3.5 cm on sagittal image 103/7. No drainable collection
identified. No intrapelvic inflammatory changes are seen. There is
iliofemoral atherosclerosis.
IMPRESSION: 1. Large area of soft tissue ulceration lateral to the left greater
trochanter with air extending approximately 7 cm deep to the skin
surface, consistent with cellulitis. Small ill-defined soft tissue
fluid collection inferior to the ulcer, but no drainable collection
identified.
2. No evidence of osteomyelitis or septic joint.
3. Chronic posttraumatic deformity of the posterior wall and column
of the left acetabulum with secondary left hip degenerative changes.
Small left hip joint effusion with probable intra-articular loose
bodies.
4. No evidence of acute fracture.
5. Marked chronic muscular atrophy as described.

## 2022-02-07 IMAGING — DX DG HIP (WITH OR WITHOUT PELVIS) 1V PORT*L*
3 series · 3 of 3 positions shown · non-contrast
Comparison: None.

CLINICAL DATA: Left hip wound

EXAM:
DG HIP (WITH OR WITHOUT PELVIS) 1V PORT LEFT

[pelvis ap]
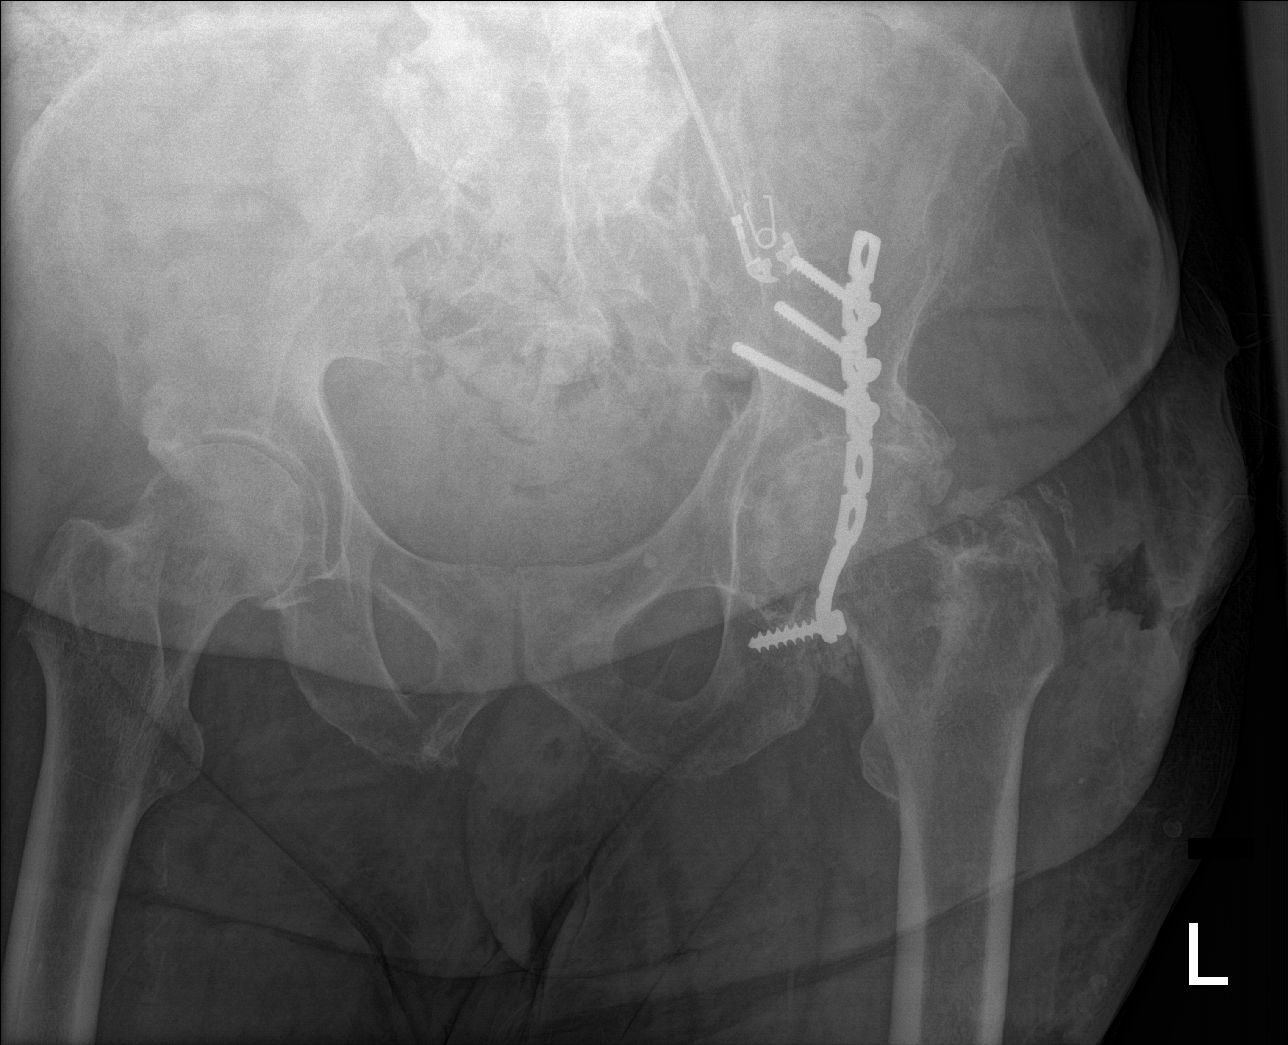

[hip ap]
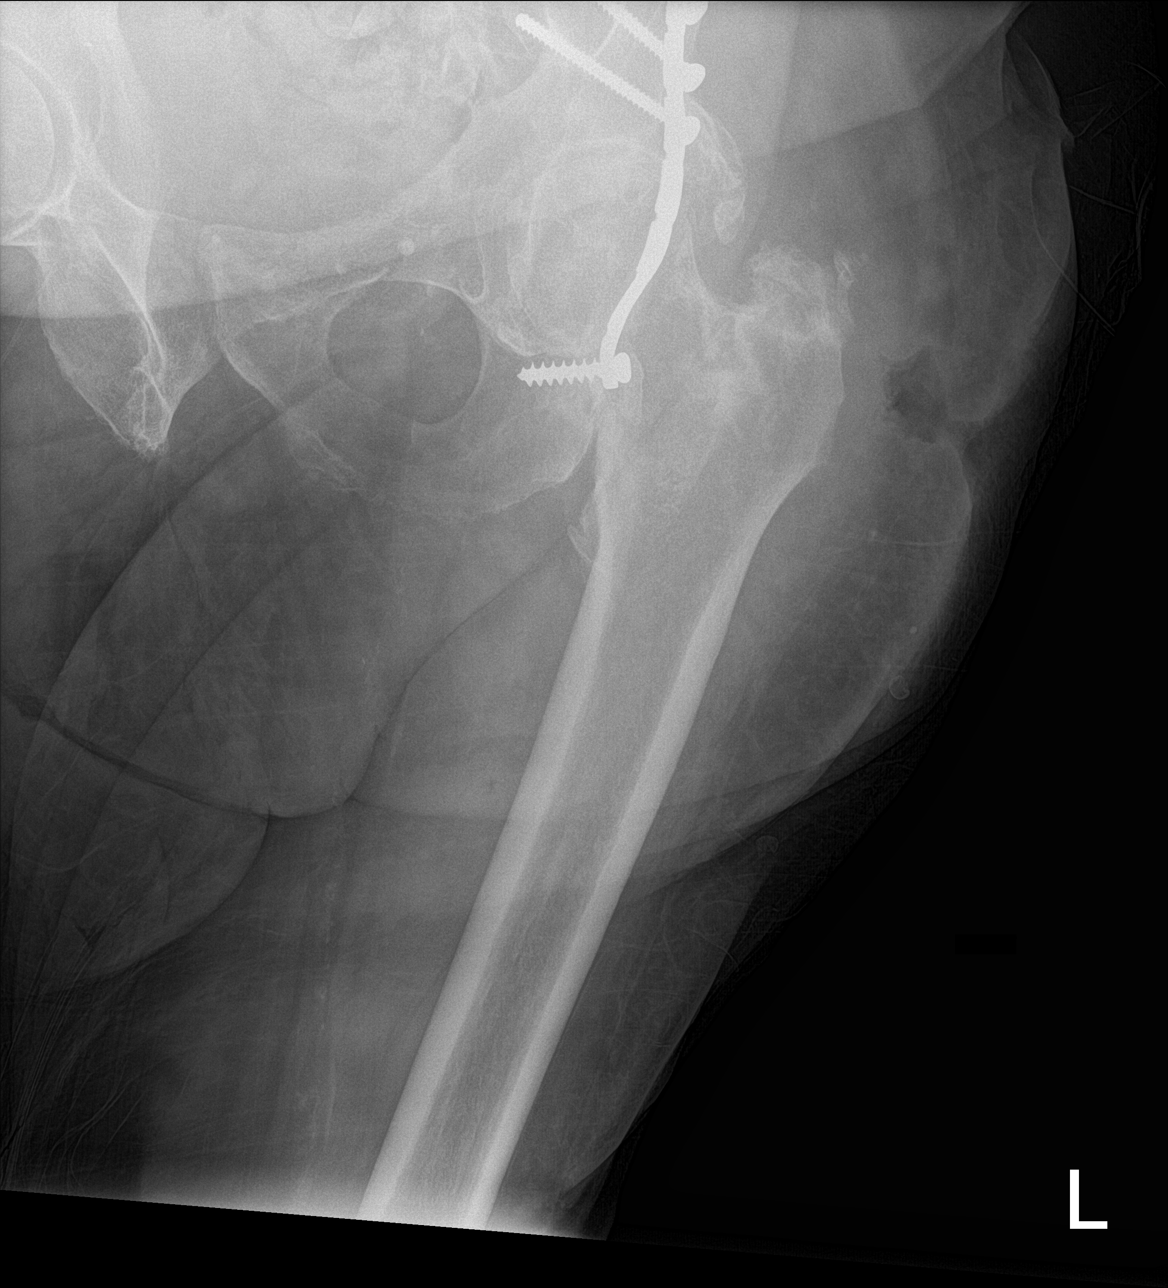

[hip frog leg]
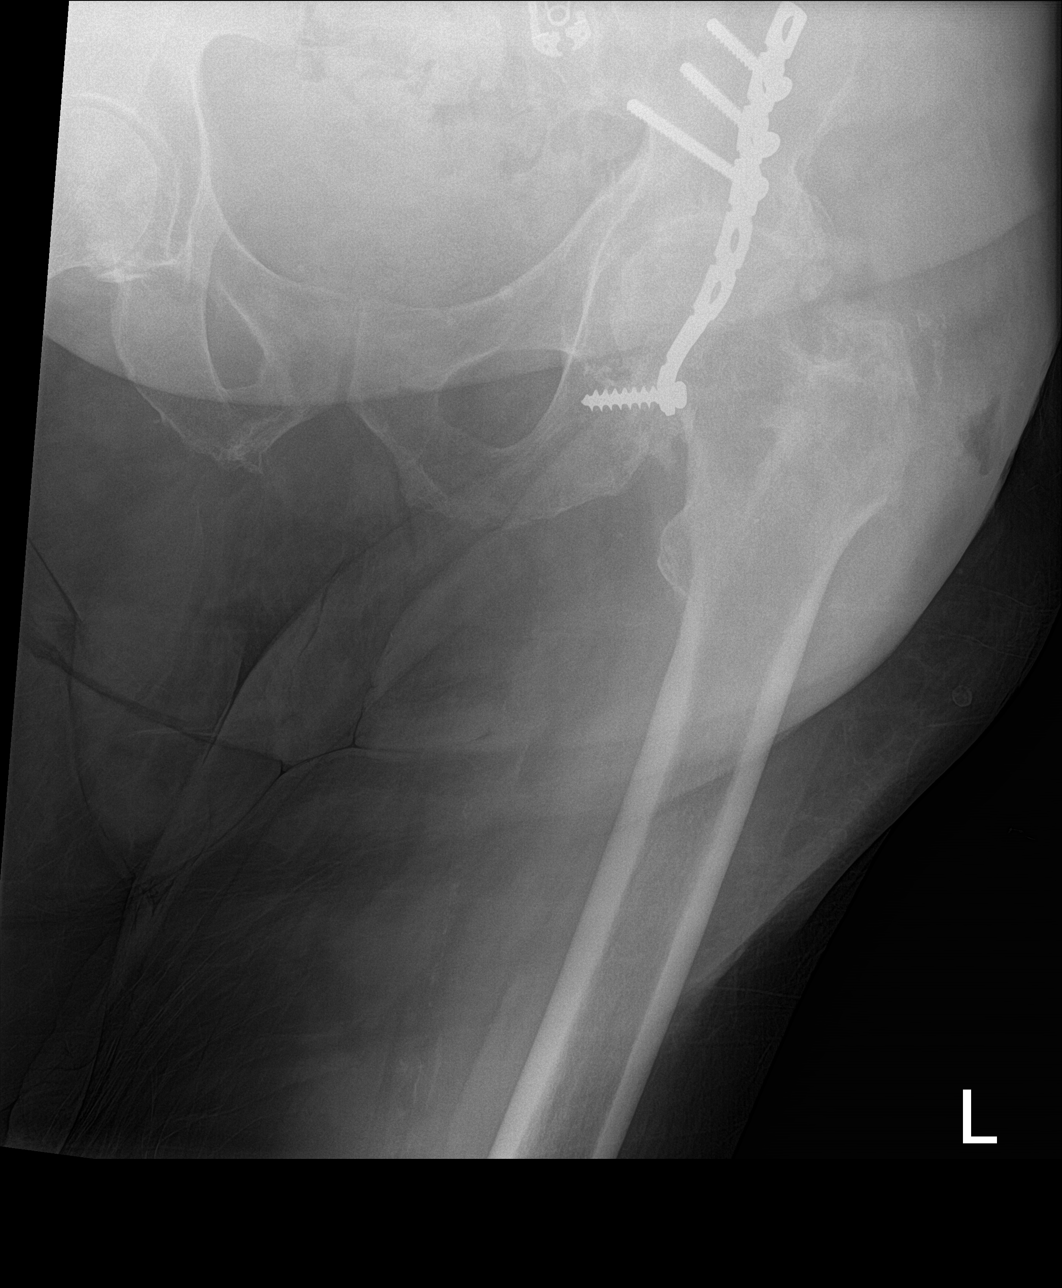

[3 of 3 positions shown; findings below may reference images not displayed]

FINDINGS: There is a drainage tract at the lateral soft tissues of the left
hip. No retained foreign body. There is screw and plate fixation
hardware along the left iliac bone. Advanced left hip degenerative
change. No evidence of active infectious osteitis.
IMPRESSION: Drainage tract at the lateral soft tissues of the left hip. No
retained foreign body.

## 2022-02-07 IMAGING — DX DG KNEE 1-2V PORT*L*
2 series · 2 of 2 positions shown · non-contrast
Comparison: None.

CLINICAL DATA: Left lower extremity pain

EXAM:
PORTABLE LEFT KNEE - 1-2 VIEW

[knee ap]
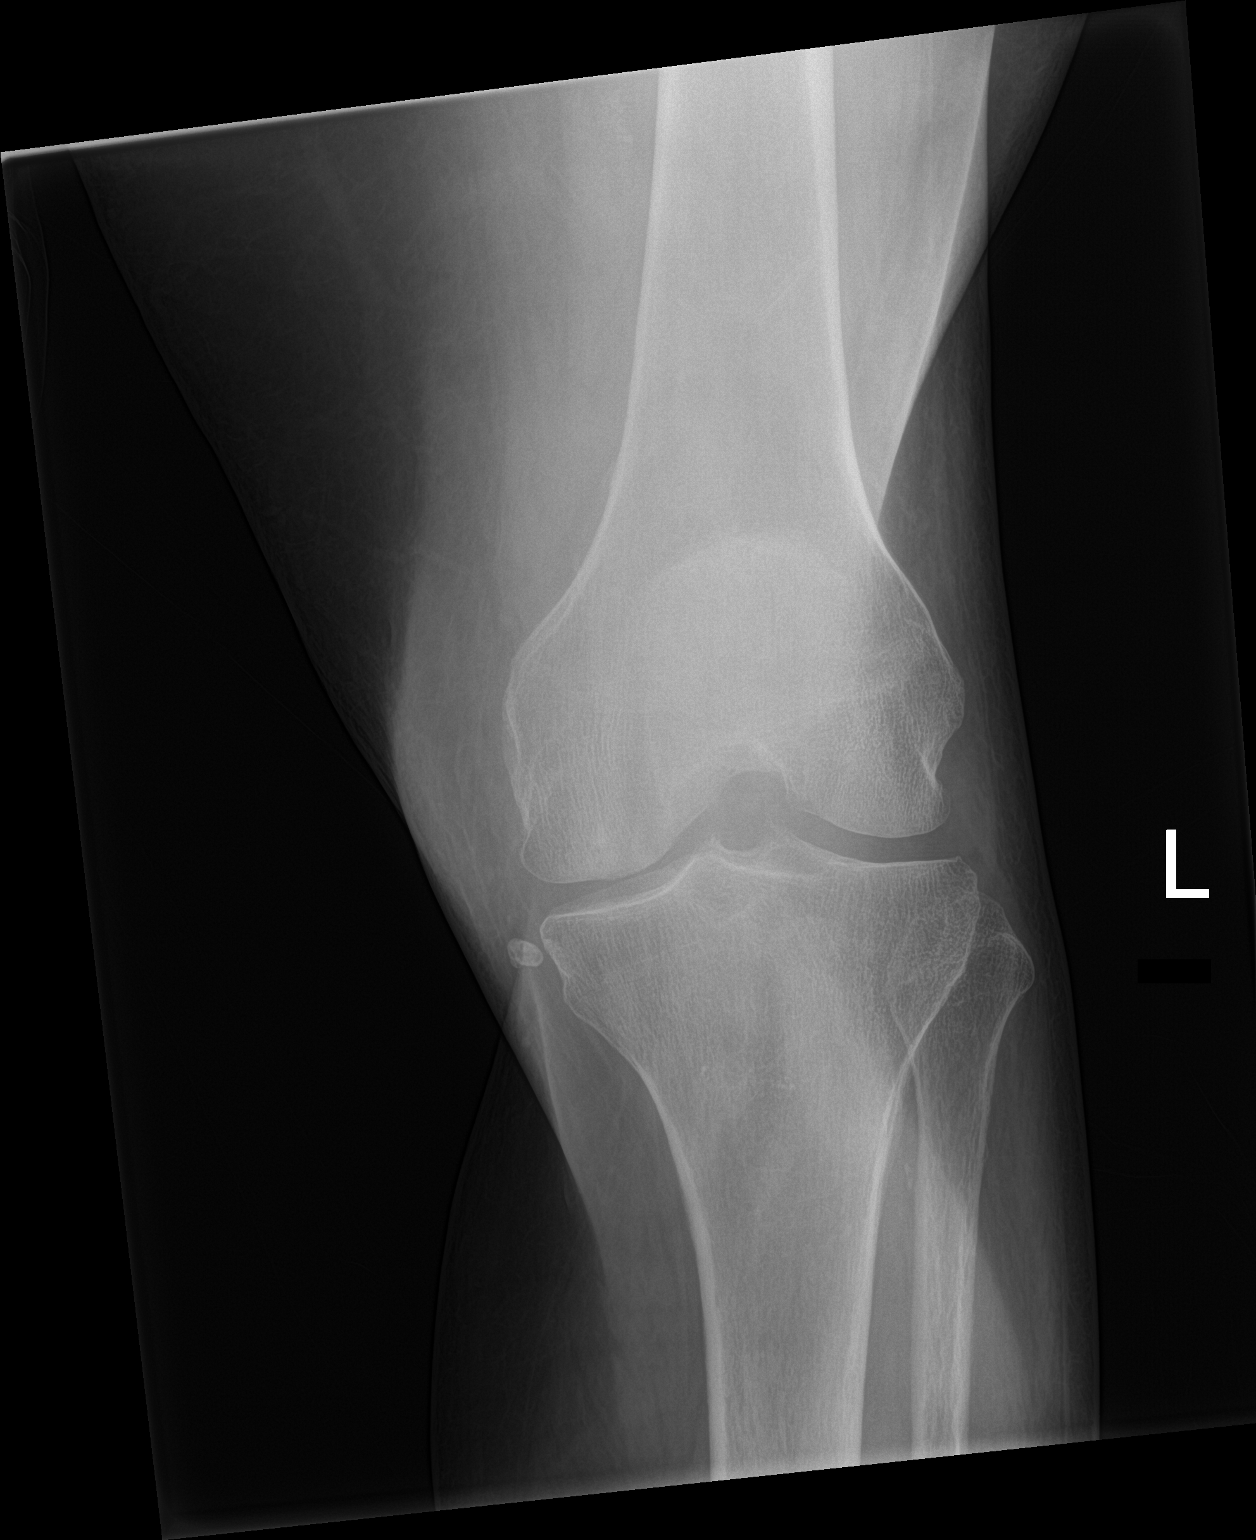

[knee lat]
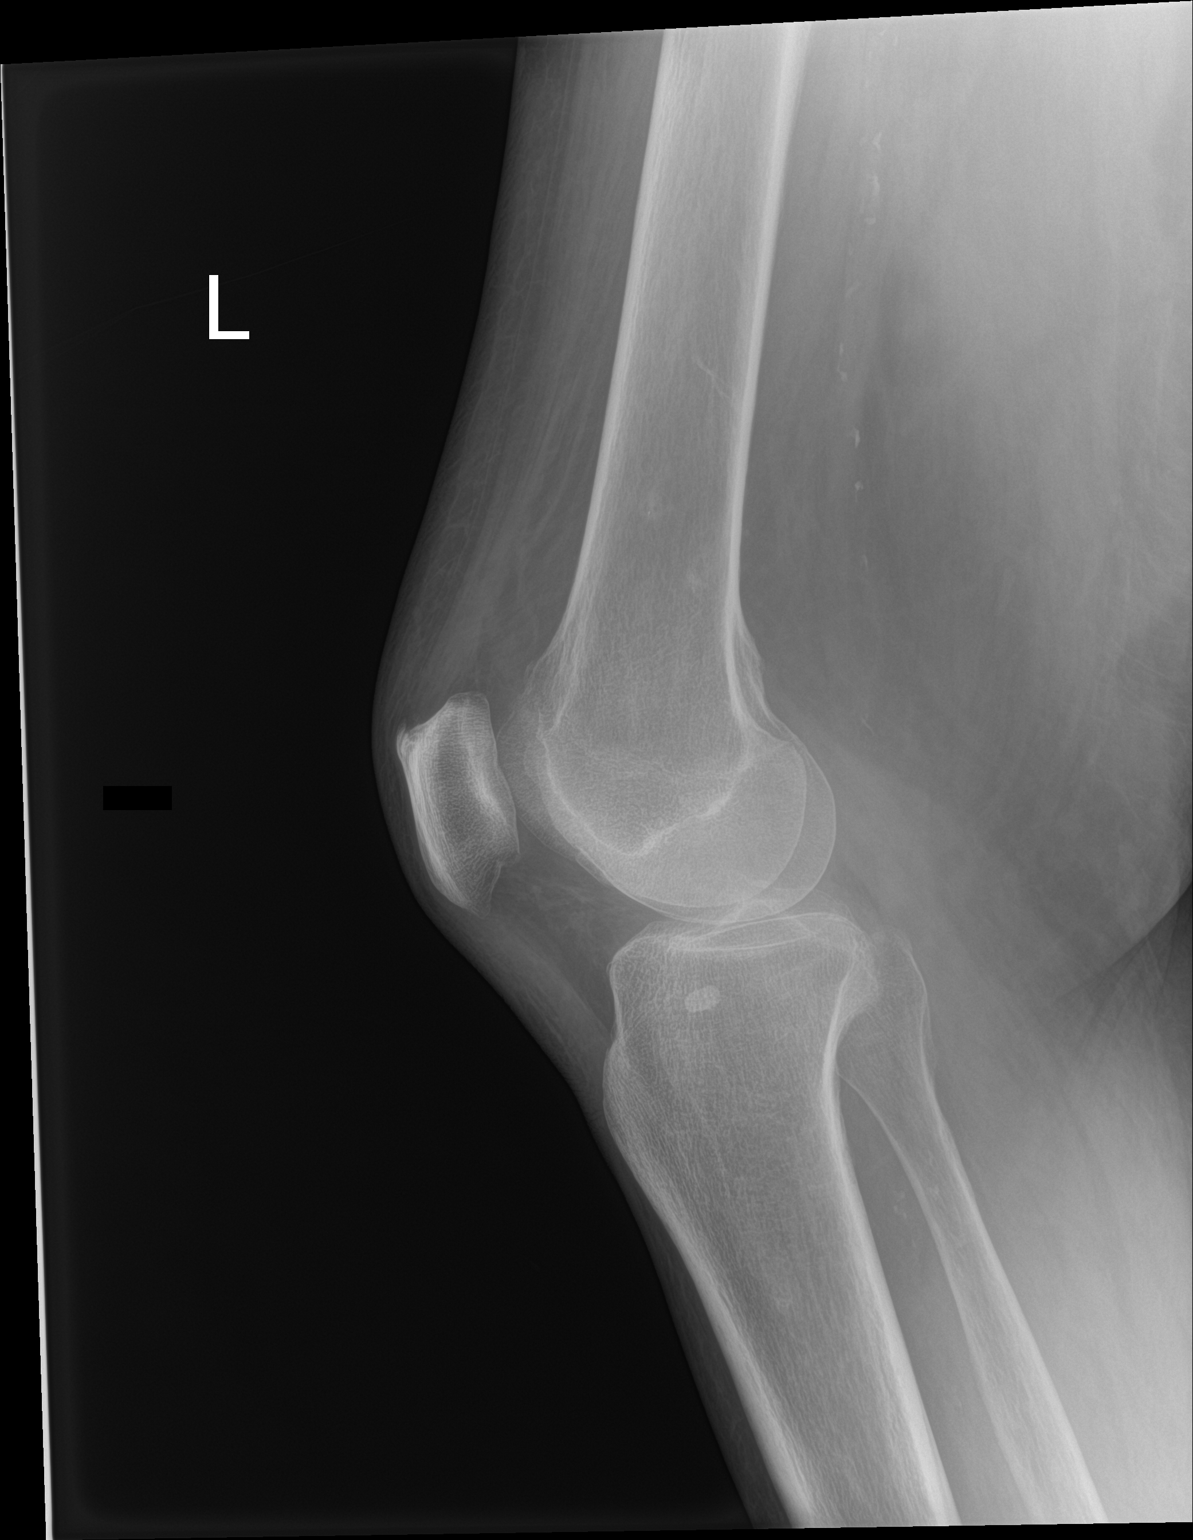

[2 of 2 positions shown; findings below may reference images not displayed]

FINDINGS: No evidence of fracture, dislocation, or joint effusion. No evidence
of arthropathy or other focal bone abnormality. Soft tissues are
unremarkable.
IMPRESSION: Negative.

## 2022-02-08 IMAGING — US US RENAL
1 series · 14 of 25 positions shown · non-contrast
Comparison: CT AP 05/07/2004

CLINICAL DATA: Microscopic hematuria for 1 day.

EXAM:
RENAL / URINARY TRACT ULTRASOUND COMPLETE

[Series 1: us renal · 14 of 35 slices shown]
[im 1/35]
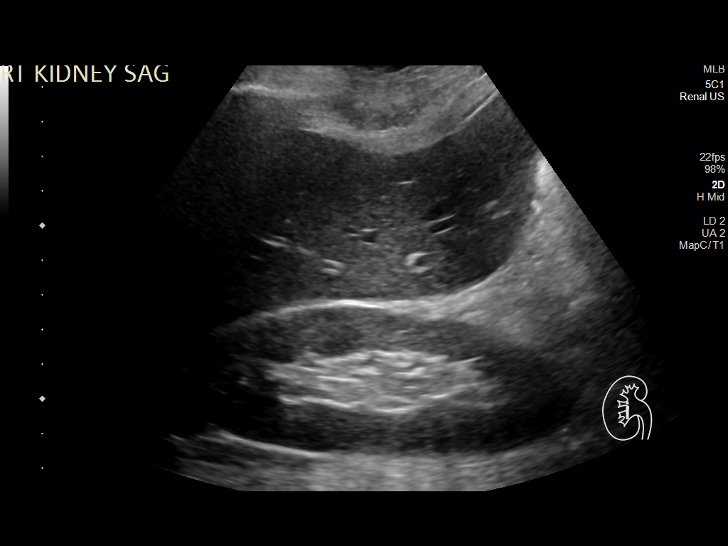
[im 3/35]
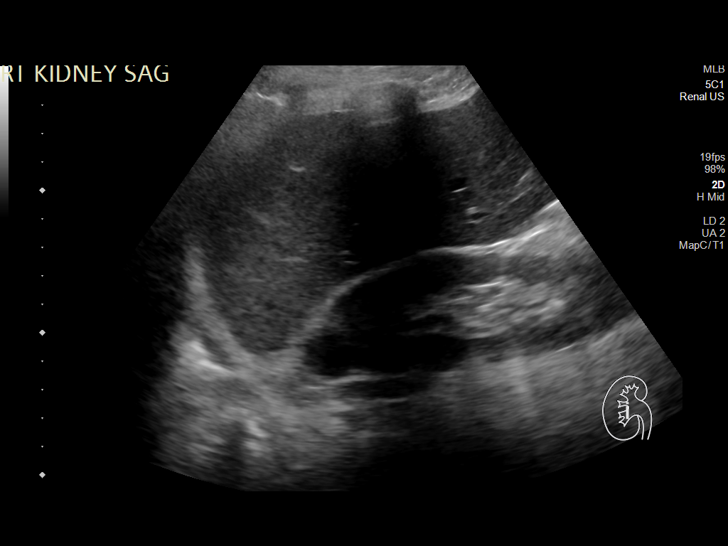
[im 6/35]
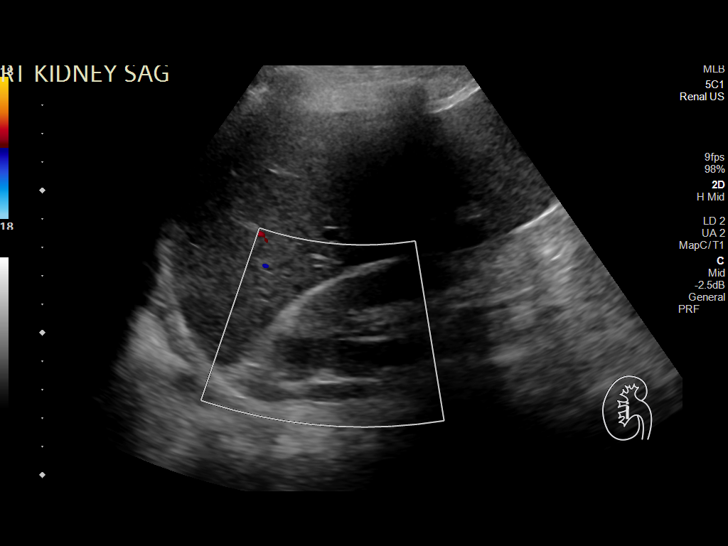
[im 9/35]
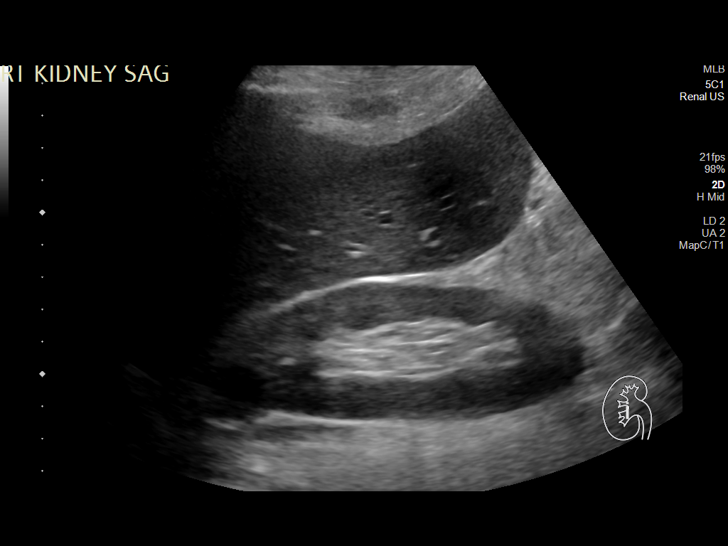
[im 12/35]
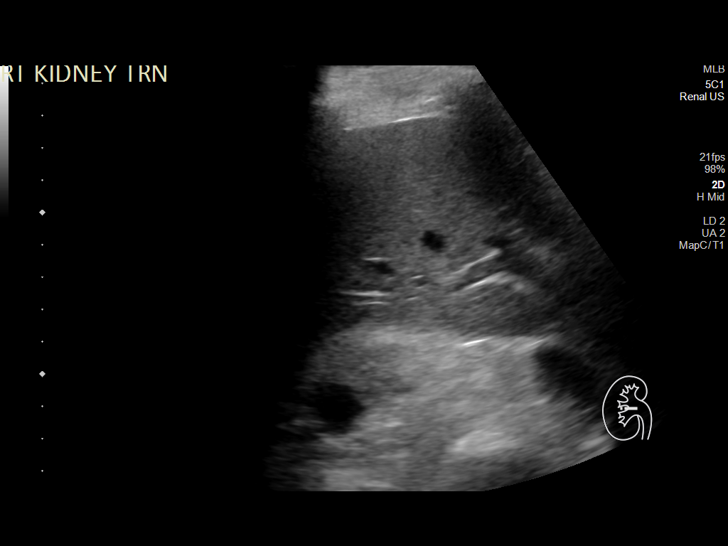
[im 13/35]
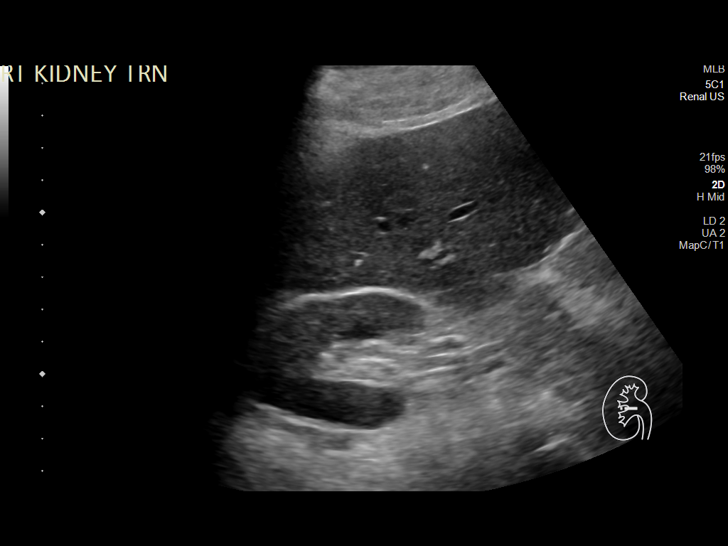
[im 16/35]
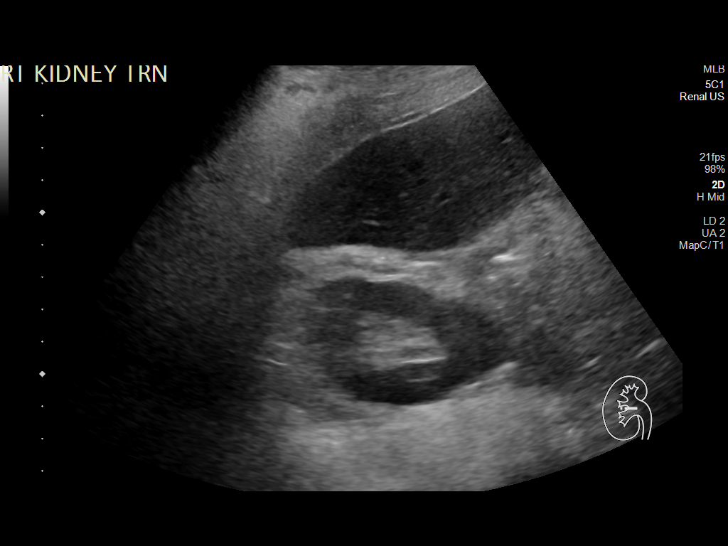
[im 19/35]
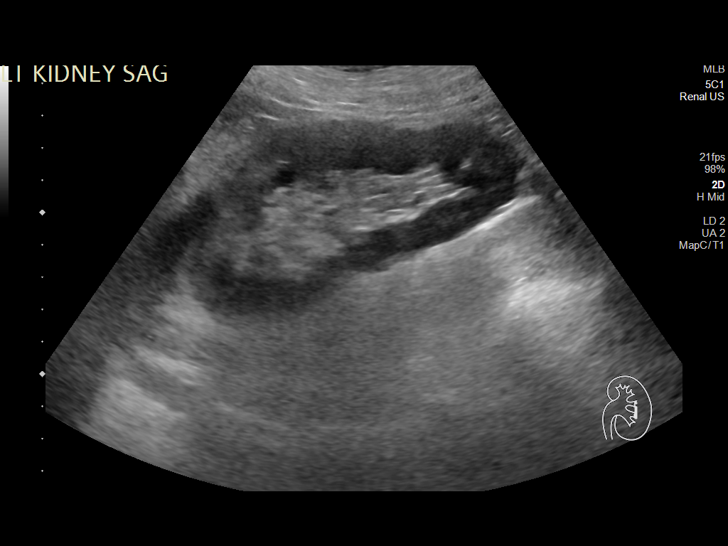
[im 22/35]
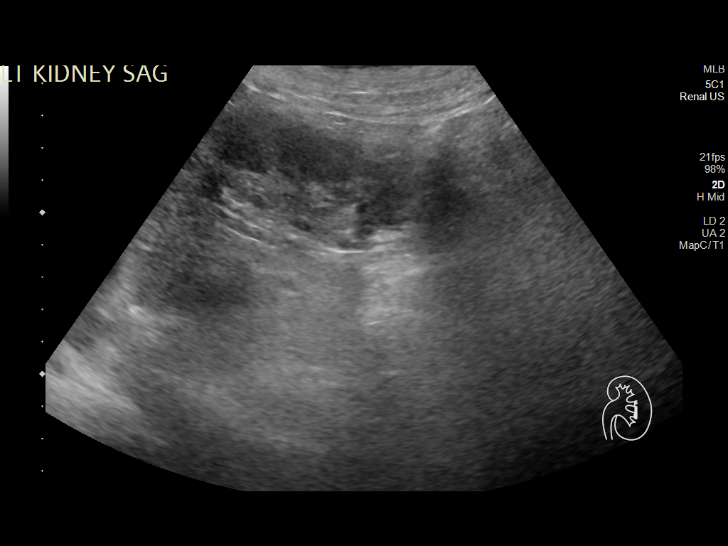
[im 23/35]
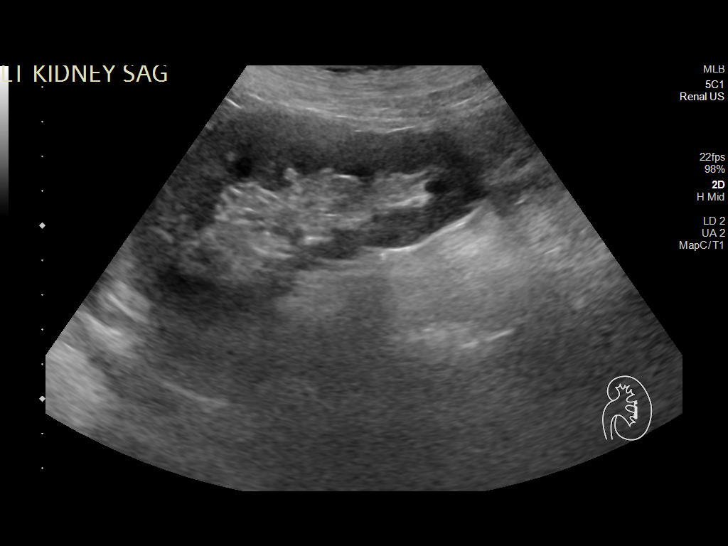
[im 26/35]
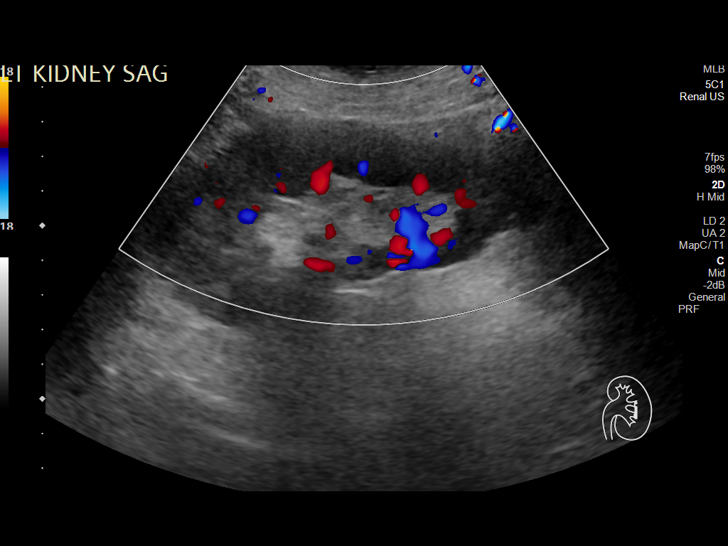
[im 29/35]
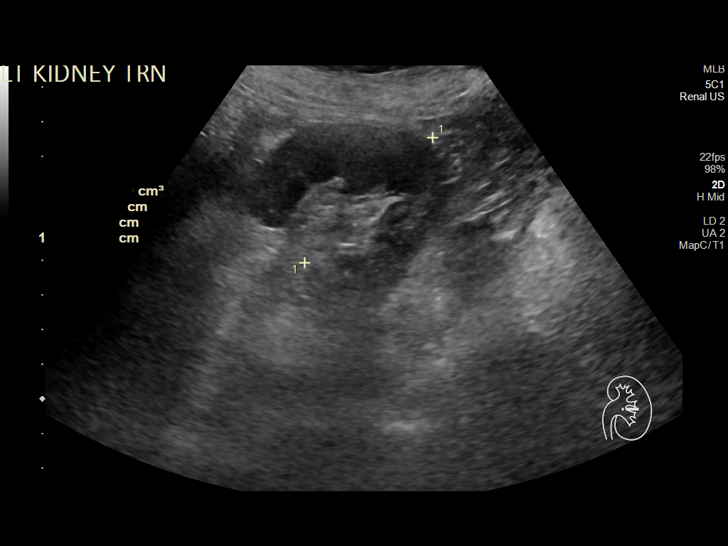
[im 32/35]
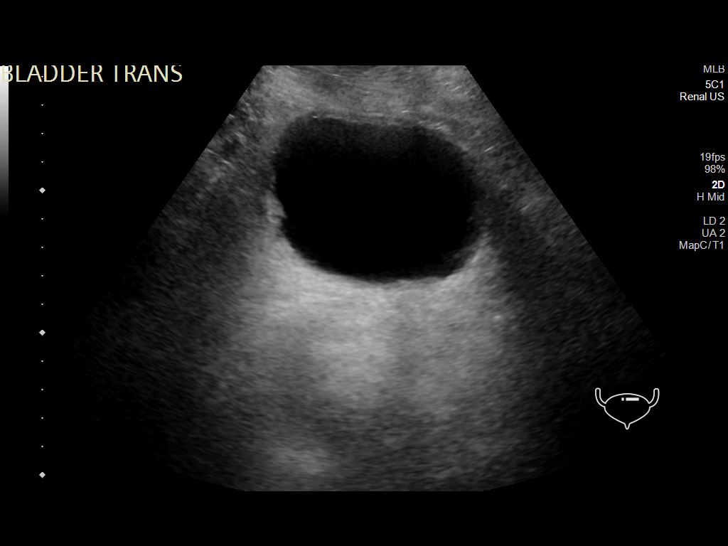
[im 35/35]
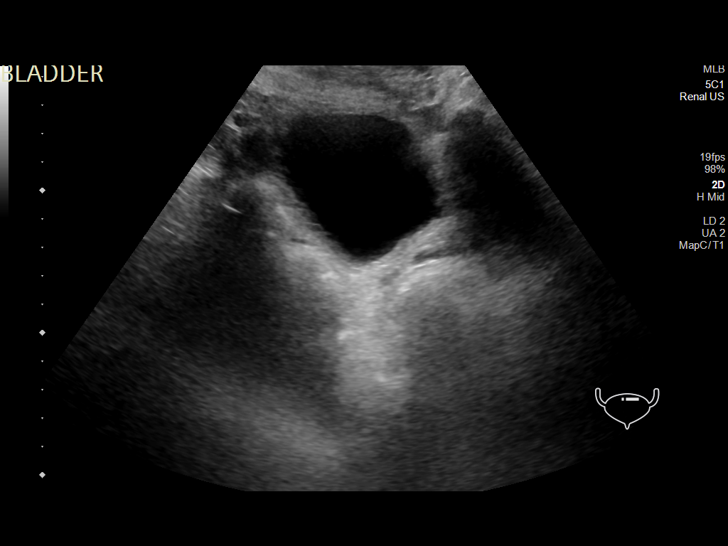

[14 of 25 positions shown; findings below may reference images not displayed]

FINDINGS: Right Kidney:

Renal measurements: 11.1 x 4.3 x 5.1 cm = volume: 127.3 mL .
Echogenicity within normal limits. No mass or hydronephrosis
visualized. Simple appearing anechoic cyst within upper pole of
right kidney measures 1.7 x 1.9 x 1.3 cm.

Left Kidney:

Renal measurements: 10.7 x 5.1 x 5.2 cm = volume: 147.4 mL.
Echogenicity within normal limits. No mass or hydronephrosis
visualized.

Bladder:

Appears normal for degree of bladder distention.

Other:

None
IMPRESSION: No findings by ultrasound to explain patient's hematuria.

## 2022-09-01 ENCOUNTER — Encounter (HOSPITAL_COMMUNITY): Payer: Self-pay | Admitting: Emergency Medicine

## 2022-09-01 ENCOUNTER — Emergency Department (HOSPITAL_COMMUNITY): Payer: Medicare Other

## 2022-09-01 ENCOUNTER — Emergency Department (HOSPITAL_COMMUNITY)
Admission: EM | Admit: 2022-09-01 | Discharge: 2022-09-02 | Disposition: A | Discharge status: Home / Self Care | Payer: Medicare Other | Attending: Student | Admitting: Student

## 2022-09-01 ENCOUNTER — Other Ambulatory Visit: Payer: Self-pay

## 2022-09-01 DIAGNOSIS — W19XXXA Unspecified fall, initial encounter: Secondary | ICD-10-CM

## 2022-09-01 DIAGNOSIS — W050XXA Fall from non-moving wheelchair, initial encounter: Secondary | ICD-10-CM | POA: Insufficient documentation

## 2022-09-01 DIAGNOSIS — M545 Low back pain, unspecified: Secondary | ICD-10-CM | POA: Diagnosis present

## 2022-09-01 DIAGNOSIS — I10 Essential (primary) hypertension: Secondary | ICD-10-CM | POA: Insufficient documentation

## 2022-09-01 DIAGNOSIS — F17210 Nicotine dependence, cigarettes, uncomplicated: Secondary | ICD-10-CM | POA: Diagnosis not present

## 2022-09-01 DIAGNOSIS — M5416 Radiculopathy, lumbar region: Secondary | ICD-10-CM | POA: Insufficient documentation

## 2022-09-01 DIAGNOSIS — E119 Type 2 diabetes mellitus without complications: Secondary | ICD-10-CM | POA: Diagnosis not present

## 2022-09-01 LAB — CBC WITH DIFFERENTIAL/PLATELET
Abs Immature Granulocytes: 0.01 10*3/uL (ref 0.00–0.07)
Basophils Absolute: 0.1 10*3/uL (ref 0.0–0.1)
Basophils Relative: 1 %
Eosinophils Absolute: 0 10*3/uL (ref 0.0–0.5)
Eosinophils Relative: 1 %
HCT: 36.1 % — ABNORMAL LOW (ref 39.0–52.0)
Hemoglobin: 12.3 g/dL — ABNORMAL LOW (ref 13.0–17.0)
Immature Granulocytes: 0 %
Lymphocytes Relative: 24 %
Lymphs Abs: 1.4 10*3/uL (ref 0.7–4.0)
MCH: 32.7 pg (ref 26.0–34.0)
MCHC: 34.1 g/dL (ref 30.0–36.0)
MCV: 96 fL (ref 80.0–100.0)
Monocytes Absolute: 0.4 10*3/uL (ref 0.1–1.0)
Monocytes Relative: 7 %
Neutro Abs: 3.9 10*3/uL (ref 1.7–7.7)
Neutrophils Relative %: 67 %
Platelets: 208 10*3/uL (ref 150–400)
RBC: 3.76 MIL/uL — ABNORMAL LOW (ref 4.22–5.81)
RDW: 13.6 % (ref 11.5–15.5)
WBC: 5.9 10*3/uL (ref 4.0–10.5)
nRBC: 0 % (ref 0.0–0.2)

## 2022-09-01 LAB — COMPREHENSIVE METABOLIC PANEL
ALT: 7 U/L (ref 0–44)
AST: 13 U/L — ABNORMAL LOW (ref 15–41)
Albumin: <1.5 g/dL — ABNORMAL LOW (ref 3.5–5.0)
Alkaline Phosphatase: 38 U/L (ref 38–126)
Anion gap: 5 (ref 5–15)
BUN: 16 mg/dL (ref 8–23)
CO2: 11 mmol/L — ABNORMAL LOW (ref 22–32)
Calcium: 4.6 mg/dL — CL (ref 8.9–10.3)
Chloride: 130 mmol/L — ABNORMAL HIGH (ref 98–111)
Creatinine, Ser: 0.53 mg/dL — ABNORMAL LOW (ref 0.61–1.24)
GFR, Estimated: >60 mL/min (ref >60)
Glucose, Bld: 51 mg/dL — ABNORMAL LOW (ref 70–99)
Potassium: 3 mmol/L — ABNORMAL LOW (ref 3.5–5.1)
Sodium: 146 mmol/L — ABNORMAL HIGH (ref 135–145)
Total Bilirubin: 0.6 mg/dL (ref 0.3–1.2)
Total Protein: 3 g/dL — ABNORMAL LOW (ref 6.5–8.1)

## 2022-09-01 LAB — I-STAT CHEM 8, ED
BUN: 25 mg/dL — ABNORMAL HIGH (ref 8–23)
Calcium, Ion: 1.16 mmol/L (ref 1.15–1.40)
Chloride: 107 mmol/L (ref 98–111)
Creatinine, Ser: 0.8 mg/dL (ref 0.61–1.24)
Glucose, Bld: 73 mg/dL (ref 70–99)
HCT: 40 % (ref 39.0–52.0)
Hemoglobin: 13.6 g/dL (ref 13.0–17.0)
Potassium: 4 mmol/L (ref 3.5–5.1)
Sodium: 142 mmol/L (ref 135–145)
TCO2: 25 mmol/L (ref 22–32)

## 2022-09-01 LAB — I-STAT VENOUS BLOOD GAS, ED
Acid-base deficit: 2 mmol/L (ref 0.0–2.0)
Bicarbonate: 22.9 mmol/L (ref 20.0–28.0)
Calcium, Ion: 1.17 mmol/L (ref 1.15–1.40)
HCT: 39 % (ref 39.0–52.0)
Hemoglobin: 13.3 g/dL (ref 13.0–17.0)
O2 Saturation: 70 %
Potassium: 4 mmol/L (ref 3.5–5.1)
Sodium: 142 mmol/L (ref 135–145)
TCO2: 24 mmol/L (ref 22–32)
pCO2, Ven: 40.7 mmHg — ABNORMAL LOW (ref 44–60)
pH, Ven: 7.358 (ref 7.25–7.43)
pO2, Ven: 38 mmHg (ref 32–45)

## 2022-09-01 LAB — CBG MONITORING, ED: Glucose-Capillary: 221 mg/dL — ABNORMAL HIGH (ref 70–99)

## 2022-09-01 MED ORDER — OXYCODONE-ACETAMINOPHEN 5-325 MG PO TABS
2.0000 | ORAL_TABLET | Freq: Once | ORAL | Status: AC
  Administered 2022-09-01: 2 via ORAL
  Filled 2022-09-01: qty 2

## 2022-09-01 MED ORDER — MAGNESIUM OXIDE -MG SUPPLEMENT 400 (240 MG) MG PO TABS
800.0000 mg | ORAL_TABLET | Freq: Once | ORAL | Status: AC
  Administered 2022-09-01: 800 mg via ORAL
  Filled 2022-09-01: qty 2

## 2022-09-01 MED ORDER — DEXTROSE 50 % IV SOLN
50.0000 mL | Freq: Once | INTRAVENOUS | Status: AC
  Administered 2022-09-01: 50 mL via INTRAVENOUS
  Filled 2022-09-01: qty 50

## 2022-09-01 MED ORDER — METHYLPREDNISOLONE 4 MG PO TBPK: ORAL_TABLET | ORAL | 0 refills | Status: DC

## 2022-09-01 MED ORDER — CALCIUM GLUCONATE-NACL 1-0.675 GM/50ML-% IV SOLN
1.0000 g | Freq: Once | INTRAVENOUS | Status: AC
  Administered 2022-09-01: 1000 mg via INTRAVENOUS
  Filled 2022-09-01: qty 50

## 2022-09-01 MED ORDER — POTASSIUM CHLORIDE CRYS ER 20 MEQ PO TBCR
40.0000 meq | EXTENDED_RELEASE_TABLET | Freq: Once | ORAL | Status: AC
  Administered 2022-09-01: 40 meq via ORAL
  Filled 2022-09-01: qty 2

## 2022-09-01 NOTE — ED Notes (Signed)
Patient transported to X-ray 

## 2022-09-01 NOTE — ED Notes (Signed)
Patient was given a sandwich and a sprite

## 2022-09-01 NOTE — ED Notes (Signed)
Patient transported to MRI 

## 2022-09-01 NOTE — ED Provider Notes (Signed)
[facility] EMERGENCY DEPARTMENT Provider Note  CSN: [id number] Arrival date & time: 09/01/22 1617  Chief Complaint(s) Fall  HPI [name] is a 66 y.o. male with PMH HTN, HLD, T2DM, left hip abscess and associated myositis who presents emergency department for evaluation of a fall.  Patient is currently wheelchair-bound and slipped out of his wheelchair today.  He states that he is also out of his pain medication and was waiting for a ride to go to the pharmacy today when he fell out of his wheelchair.  Patient is alert and oriented answering all questions appropriately here in the emergency department, but EMS noted the patient to be encephalopathic.  Patient endorses pain at sacrum and in the low back but denies chest pain, shortness of breath, Donnell pain, nausea, vomiting or other systemic symptoms.   Past Medical History Past Medical History:  Diagnosis Date   Cellulitis 07/17/2020   LEFT HIP   Diabetes mellitus    Hyperlipidemia    Hypertension    Patient Active Problem List   Diagnosis Date Noted   Malnutrition of moderate degree 09/11/2020   Loss of weight 09/07/2020   Candidal intertrigo 09/07/2020   Fall at home 09/06/2020   Hypoglycemia 07/31/2020   Purulent Cellulitis of left hip 07/15/2020   Recurrent falls 07/15/2020   Frailty syndrome in geriatric patient 07/15/2020   Physical deconditioning 07/15/2020   Asymptomatic bacteriuria 07/15/2020   Diabetes mellitus (HCC) 01/28/2012   Home Medication(s) Prior to Admission medications   Medication Sig Start Date End Date Taking? Authorizing Provider  methylPREDNISolone (MEDROL DOSEPAK) 4 MG TBPK tablet Take as prescribed 09/01/22  Yes Asna Muldrow, MD  escitalopram (LEXAPRO) 10 MG tablet Take 1 tablet (10 mg total) by mouth daily. Patient not taking: Reported on 05/13/2021 09/18/20   Bobbye Morton, MD  Multiple Vitamin (MULTIVITAMIN WITH MINERALS) TABS tablet Take 1 tablet by mouth  daily. Patient not taking: Reported on 05/13/2021 09/18/20   Bobbye Morton, MD  Nutritional Supplements (ENSURE PO) Take 1 Bottle by mouth daily with breakfast.    [provider]  oxycodone-acetaminophen (LYNOX) 10-300 MG tablet Take 1 tablet by mouth every 6 (six) hours as needed for pain.    [provider]  tiZANidine (ZANAFLEX) 4 MG capsule Take 4 mg by mouth 3 (three) times daily.    [provider]  Triamcinolone Acetonide (TRIAMCINOLONE 0.1 % CREAM : EUCERIN) CREA Apply 1 application topically 3 (three) times daily. Patient not taking: Reported on 05/13/2021 09/17/20   Bobbye Morton, MD                                                                                                                                    Past Surgical History Past Surgical History:  Procedure Laterality Date   SPINE SURGERY     Family History No family history on file.  Social History Social History  Tobacco Use   Smoking status: Every Day    Packs/day: 0.50    Years: 40.00    Total pack years: 20.00    Types: Cigarettes   Smokeless tobacco: Never  Vaping Use   Vaping Use: Never used  Substance Use Topics   Alcohol use: No   Drug use: No   Allergies Lipitor [atorvastatin calcium]  Review of Systems Review of Systems  Musculoskeletal:  Positive for back pain.    Physical Exam Vital Signs  I have reviewed the triage vital signs BP (!) 148/81   Pulse 85   Temp 98.1 F (36.7 C)   Resp 16   Ht 5\' 10"  (1.778 m)   Wt 100.7 kg   SpO2 99%   BMI 31.85 kg/m   Physical Exam Vitals and nursing note reviewed.  Constitutional:      General: He is not in acute distress.    Appearance: He is well-developed.  HENT:     Head: Normocephalic and atraumatic.  Eyes:     Conjunctiva/sclera: Conjunctivae normal.  Cardiovascular:     Rate and Rhythm: Normal rate and regular rhythm.     Heart sounds: No murmur heard. Pulmonary:     Effort: Pulmonary effort is  normal. No respiratory distress.  Musculoskeletal:        General: Tenderness present. No swelling.     Cervical back: Neck supple.  Skin:    General: Skin is warm and dry.  Neurological:     Mental Status: He is alert.  Psychiatric:        Mood and Affect: Mood normal.     ED Results and Treatments Labs (all labs ordered are listed, but only abnormal results are displayed) Labs Reviewed  CBC WITH DIFFERENTIAL/PLATELET - Abnormal; Notable for the following components:      Result Value   RBC 3.76 (*)    Hemoglobin 12.3 (*)    HCT 36.1 (*)    All other components within normal limits  COMPREHENSIVE METABOLIC PANEL - Abnormal; Notable for the following components:   Sodium 146 (*)    Potassium 3.0 (*)    Chloride 130 (*)    CO2 11 (*)    Glucose, Bld 51 (*)    Creatinine, Ser 0.53 (*)    Calcium 4.6 (*)    Total Protein 3.0 (*)    Albumin <1.5 (*)    AST 13 (*)    All other components within normal limits  I-STAT CHEM 8, ED - Abnormal; Notable for the following components:   BUN 25 (*)    All other components within normal limits  I-STAT VENOUS BLOOD GAS, ED - Abnormal; Notable for the following components:   pCO2, Ven 40.7 (*)    All other components within normal limits  URINALYSIS, ROUTINE W REFLEX MICROSCOPIC  Radiology MR Cervical Spine Wo Contrast  Result Date: 09/01/2022 CLINICAL DATA:  Acute myelopathy EXAM: MRI CERVICAL, THORACIC AND LUMBAR SPINE WITHOUT CONTRAST TECHNIQUE: Multiplanar and multiecho pulse sequences of the cervical spine, to include the craniocervical junction and cervicothoracic junction, and thoracic and lumbar spine, were obtained without intravenous contrast. COMPARISON:  09/10/2020 FINDINGS: MRI CERVICAL SPINE FINDINGS Alignment: Physiologic. Vertebrae: C5-6 ACDF Cord: Small area myelomalacia at the C5-6 level. Posterior  Fossa, vertebral arteries, paraspinal tissues: Negative. Disc levels: C1-2: Unremarkable. C2-3: Normal disc space and facet joints. There is no spinal canal stenosis. No neural foraminal stenosis. C3-4: Disc bulge with bilateral uncovertebral hypertrophy. There is no spinal canal stenosis. Unchanged moderate bilateral neural foraminal stenosis. C4-5: Small disc bulge. There is no spinal canal stenosis. No neural foraminal stenosis. C5-6: ACDF. There is no spinal canal stenosis. No neural foraminal stenosis. C6-7: Mild disc bulge. There is no spinal canal stenosis. No neural foraminal stenosis. C7-T1: Normal disc space and facet joints. There is no spinal canal stenosis. No neural foraminal stenosis. MRI THORACIC SPINE FINDINGS Alignment:  Physiologic. Vertebrae: Diffusely heterogeneous bone marrow signal throughout the thoracic spine. No acute abnormality. Diffuse lateral osteophytosis. Cord:  Normal Paraspinal and other soft tissues: Negative Disc levels: T6-7: Small right subarticular disc protrusion contacting the ventral spinal cord. MRI LUMBAR SPINE FINDINGS Segmentation:  Standard. Alignment:  Physiologic. Vertebrae: Bulky lateral osteophytes at multiple levels. No acute abnormality. Heterogeneous bone marrow signal is unchanged. Conus medullaris and cauda equina: Conus extends to the L1 level. Conus and cauda equina appear normal. Paraspinal and other soft tissues: Negative Disc levels: L1-L2: Normal disc space and facet joints. No spinal canal stenosis. No neural foraminal stenosis. L2-L3: Large extraforaminal right-sided osteophyte contacts the exiting right L2 nerve root. No spinal canal stenosis. No neural foraminal stenosis. L3-L4: Normal disc space and facet joints. No spinal canal stenosis. No neural foraminal stenosis. L4-L5: Small disc bulge with bilateral lateral osteophytes. No spinal canal stenosis. Mild bilateral neural foraminal stenosis. Left-greater-than-right lateral osteophytes could affect  the exiting nerve roots. L5-S1: Right-greater-than-left lateral osteophytes which could affect the exiting nerve roots. Small disc bulge. No spinal canal stenosis. Unchanged mild left neural foraminal stenosis. Visualized sacrum: Normal. IMPRESSION: 1. C5-6 ACDF with no residual spinal canal or neural foraminal stenosis. Small area of myelomalacia at this level. 2. No thoracic spinal canal or neural foraminal stenosis. 3. Unchanged appearance of the lumbar spine with mild bilateral L4-5 and left L5-S1 neural foraminal stenosis. 4. Large extraforaminal right-sided osteophyte at L2-L3 contacts the exiting right L2 nerve root. Electronically Signed   By: Deatra Robinson M.D.   On: 09/01/2022 22:27   MR THORACIC SPINE WO CONTRAST  Result Date: 09/01/2022 CLINICAL DATA:  Acute myelopathy EXAM: MRI CERVICAL, THORACIC AND LUMBAR SPINE WITHOUT CONTRAST TECHNIQUE: Multiplanar and multiecho pulse sequences of the cervical spine, to include the craniocervical junction and cervicothoracic junction, and thoracic and lumbar spine, were obtained without intravenous contrast. COMPARISON:  09/10/2020 FINDINGS: MRI CERVICAL SPINE FINDINGS Alignment: Physiologic. Vertebrae: C5-6 ACDF Cord: Small area myelomalacia at the C5-6 level. Posterior Fossa, vertebral arteries, paraspinal tissues: Negative. Disc levels: C1-2: Unremarkable. C2-3: Normal disc space and facet joints. There is no spinal canal stenosis. No neural foraminal stenosis. C3-4: Disc bulge with bilateral uncovertebral hypertrophy. There is no spinal canal stenosis. Unchanged moderate bilateral neural foraminal stenosis. C4-5: Small disc bulge. There is no spinal canal stenosis. No neural foraminal stenosis. C5-6: ACDF. There is no spinal canal stenosis. No neural foraminal stenosis. C6-7:  Mild disc bulge. There is no spinal canal stenosis. No neural foraminal stenosis. C7-T1: Normal disc space and facet joints. There is no spinal canal stenosis. No neural foraminal  stenosis. MRI THORACIC SPINE FINDINGS Alignment:  Physiologic. Vertebrae: Diffusely heterogeneous bone marrow signal throughout the thoracic spine. No acute abnormality. Diffuse lateral osteophytosis. Cord:  Normal Paraspinal and other soft tissues: Negative Disc levels: T6-7: Small right subarticular disc protrusion contacting the ventral spinal cord. MRI LUMBAR SPINE FINDINGS Segmentation:  Standard. Alignment:  Physiologic. Vertebrae: Bulky lateral osteophytes at multiple levels. No acute abnormality. Heterogeneous bone marrow signal is unchanged. Conus medullaris and cauda equina: Conus extends to the L1 level. Conus and cauda equina appear normal. Paraspinal and other soft tissues: Negative Disc levels: L1-L2: Normal disc space and facet joints. No spinal canal stenosis. No neural foraminal stenosis. L2-L3: Large extraforaminal right-sided osteophyte contacts the exiting right L2 nerve root. No spinal canal stenosis. No neural foraminal stenosis. L3-L4: Normal disc space and facet joints. No spinal canal stenosis. No neural foraminal stenosis. L4-L5: Small disc bulge with bilateral lateral osteophytes. No spinal canal stenosis. Mild bilateral neural foraminal stenosis. Left-greater-than-right lateral osteophytes could affect the exiting nerve roots. L5-S1: Right-greater-than-left lateral osteophytes which could affect the exiting nerve roots. Small disc bulge. No spinal canal stenosis. Unchanged mild left neural foraminal stenosis. Visualized sacrum: Normal. IMPRESSION: 1. C5-6 ACDF with no residual spinal canal or neural foraminal stenosis. Small area of myelomalacia at this level. 2. No thoracic spinal canal or neural foraminal stenosis. 3. Unchanged appearance of the lumbar spine with mild bilateral L4-5 and left L5-S1 neural foraminal stenosis. 4. Large extraforaminal right-sided osteophyte at L2-L3 contacts the exiting right L2 nerve root. Electronically Signed   By: Deatra Robinson M.D.   On: 09/01/2022  22:27   MR LUMBAR SPINE WO CONTRAST  Result Date: 09/01/2022 CLINICAL DATA:  Acute myelopathy EXAM: MRI CERVICAL, THORACIC AND LUMBAR SPINE WITHOUT CONTRAST TECHNIQUE: Multiplanar and multiecho pulse sequences of the cervical spine, to include the craniocervical junction and cervicothoracic junction, and thoracic and lumbar spine, were obtained without intravenous contrast. COMPARISON:  09/10/2020 FINDINGS: MRI CERVICAL SPINE FINDINGS Alignment: Physiologic. Vertebrae: C5-6 ACDF Cord: Small area myelomalacia at the C5-6 level. Posterior Fossa, vertebral arteries, paraspinal tissues: Negative. Disc levels: C1-2: Unremarkable. C2-3: Normal disc space and facet joints. There is no spinal canal stenosis. No neural foraminal stenosis. C3-4: Disc bulge with bilateral uncovertebral hypertrophy. There is no spinal canal stenosis. Unchanged moderate bilateral neural foraminal stenosis. C4-5: Small disc bulge. There is no spinal canal stenosis. No neural foraminal stenosis. C5-6: ACDF. There is no spinal canal stenosis. No neural foraminal stenosis. C6-7: Mild disc bulge. There is no spinal canal stenosis. No neural foraminal stenosis. C7-T1: Normal disc space and facet joints. There is no spinal canal stenosis. No neural foraminal stenosis. MRI THORACIC SPINE FINDINGS Alignment:  Physiologic. Vertebrae: Diffusely heterogeneous bone marrow signal throughout the thoracic spine. No acute abnormality. Diffuse lateral osteophytosis. Cord:  Normal Paraspinal and other soft tissues: Negative Disc levels: T6-7: Small right subarticular disc protrusion contacting the ventral spinal cord. MRI LUMBAR SPINE FINDINGS Segmentation:  Standard. Alignment:  Physiologic. Vertebrae: Bulky lateral osteophytes at multiple levels. No acute abnormality. Heterogeneous bone marrow signal is unchanged. Conus medullaris and cauda equina: Conus extends to the L1 level. Conus and cauda equina appear normal. Paraspinal and other soft tissues:  Negative Disc levels: L1-L2: Normal disc space and facet joints. No spinal canal stenosis. No neural foraminal stenosis. L2-L3: Large extraforaminal right-sided osteophyte  contacts the exiting right L2 nerve root. No spinal canal stenosis. No neural foraminal stenosis. L3-L4: Normal disc space and facet joints. No spinal canal stenosis. No neural foraminal stenosis. L4-L5: Small disc bulge with bilateral lateral osteophytes. No spinal canal stenosis. Mild bilateral neural foraminal stenosis. Left-greater-than-right lateral osteophytes could affect the exiting nerve roots. L5-S1: Right-greater-than-left lateral osteophytes which could affect the exiting nerve roots. Small disc bulge. No spinal canal stenosis. Unchanged mild left neural foraminal stenosis. Visualized sacrum: Normal. IMPRESSION: 1. C5-6 ACDF with no residual spinal canal or neural foraminal stenosis. Small area of myelomalacia at this level. 2. No thoracic spinal canal or neural foraminal stenosis. 3. Unchanged appearance of the lumbar spine with mild bilateral L4-5 and left L5-S1 neural foraminal stenosis. 4. Large extraforaminal right-sided osteophyte at L2-L3 contacts the exiting right L2 nerve root. Electronically Signed   By: Deatra Robinson M.D.   On: 09/01/2022 22:27   CT Lumbar Spine Wo Contrast  Result Date: 09/01/2022 CLINICAL DATA:  Trauma, low back pain EXAM: CT LUMBAR SPINE WITHOUT CONTRAST TECHNIQUE: Multidetector CT imaging of the lumbar spine was performed without intravenous contrast administration. Multiplanar CT image reconstructions were also generated. RADIATION DOSE REDUCTION: This exam was performed according to the departmental dose-optimization program which includes automated exposure control, adjustment of the mA and/or kV according to patient size and/or use of iterative reconstruction technique. COMPARISON:  09/11/2020 CT abdomen FINDINGS: Segmentation: The lowest lumbar type non-rib-bearing vertebra is labeled as L5.  Alignment: No vertebral subluxation is observed. Vertebrae: Substantial thoracolumbar spondylosis noted with bridging spurring along spinous processes from T10 through L1 and bridging interbody fusion at multiple thoracolumbar levels as before. All visible levels demonstrates some degree of bridging interbody spurring. Fused SI joints. No lumbar spine fracture or acute bony finding identified. Left posterior acetabular hardware. Paraspinal and other soft tissues: Atherosclerosis is present, including aortoiliac atherosclerotic disease. IVC filter noted. Right kidney upper pole lesion previously identified is benign cyst. This requires no further workup based on previous assessment. Disc levels: There is left foraminal stenosis at L3-4, there is bilateral foraminal stenosis at L3-4, L4-5, and L5-S1 due to spondylosis and degenerative disc disease. In addition there is displacement of the right L2 nerve in the lateral extraforaminal space due to prominent L2-3 spurring. IMPRESSION: 1. Lumbar impingement at L2-3, L3-4, L4-5, and L5-S1 primarily due to spurring and to a lesser extent from degenerative disc disease. 2. Substantial multilevel interbody spurring and fused facet joints. 3. No acute fracture or acute subluxation identified. 4.  Aortic Atherosclerosis (ICD10-I70.0). 5. IVC filter noted. Electronically Signed   By: Gaylyn Rong M.D.   On: 09/01/2022 18:57   DG Hip Unilat W or Wo Pelvis 2-3 Views Right  Result Date: 09/01/2022 CLINICAL DATA:  Fall, low back pain, right hip pain. EXAM: DG HIP (WITH OR WITHOUT PELVIS) 2-3V RIGHT COMPARISON:  None Available. FINDINGS: Postoperative changes in the left hemipelvis. Bilateral hip osteoarthritis, left greater than right with joint space narrowing and spurring. No acute fracture, subluxation or dislocation. Fusion across the SI joints bilaterally. Degenerative changes in the visualized lower lumbar spine. IMPRESSION: No acute bony abnormality. Electronically  Signed   By: Charlett Nose M.D.   On: 09/01/2022 17:10    Pertinent labs & imaging results that were available during my care of the patient were reviewed by me and considered in my medical decision making (see MDM for details).  Medications Ordered in ED Medications  oxyCODONE-acetaminophen (PERCOCET/ROXICET) 5-325 MG per tablet 2 tablet (  2 tablets Oral Given 09/01/22 1747)  calcium gluconate 1 g/ 50 mL sodium chloride IVPB (0 mg Intravenous Stopped 09/01/22 2258)                                                                                                                                     Procedures .Critical Care  Performed by: Glendora Score, MD Authorized by: Glendora Score, MD   Critical care provider statement:    Critical care time (minutes):  30   Critical care was necessary to treat or prevent imminent or life-threatening deterioration of the following conditions:  Metabolic crisis   Critical care was time spent personally by me on the following activities:  Development of treatment plan with patient or surrogate, discussions with consultants, evaluation of patient's response to treatment, examination of patient, ordering and review of laboratory studies, ordering and review of radiographic studies, ordering and performing treatments and interventions, pulse oximetry, re-evaluation of patient's condition and review of old charts   (including critical care time)  Medical Decision Making / ED Course   This patient presents to the ED for concern of fall, back pain, this involves an extensive number of treatment options, and is a complaint that carries with it a high risk of complications and morbidity.  The differential diagnosis includes radiculopathy, fracture, cauda equina, cervical stenosis  MDM: Patient seen emergency room for evaluation of multiple complaints described above.  Physical exam with tenderness in the L-spine but is otherwise unremarkable.  Laboratory  evaluation with a hemoglobin of 12.3, sodium 146, potassium 3.0, bicarb is 11, glucose 51.  Electrolytes repleted and patient given food, orange juice and an amp of D50.  Patient's calcium initially read 4.6 and he was empirically given calcium gluconate, but close follow-up ionized calcium is normal and his VBG shows a normal pH.  Suspect electrolyte abnormalities are secondary to decreased p.o. intake as corrected calcium is 7 with is undetectable albumin.  Initial x-ray imaging of the hip is unremarkable, CT L-spine with BHUC patient is also complaining of difficulty with grasping objects in the right upper extremity and previous MRI imaging showing a thoracic compression and he also has a history of previous cervical stenosis status post fusion.  Thus expanded MRI imaging of the CT and L-spine was performed that does not show any acute findings but does show L2 nerve root compression.  Patient will require outpatient neurosurgery follow-up and a Medrol Dosepak was sent to his pharmacy.  Patient is currently alert and oriented answering all questions appropriately and does not meet inpatient criteria for admission, and thus transport was arranged home.  We will follow-up on his glucose while waiting for transport home and pursue admission if persistently low.  If glucose corrects, he is safe for discharge.   Additional history obtained:  -External records from outside source obtained and reviewed including: Chart review including previous notes, labs, imaging, consultation notes  Lab Tests: -I ordered, reviewed, and interpreted labs.   The pertinent results include:   Labs Reviewed  CBC WITH DIFFERENTIAL/PLATELET - Abnormal; Notable for the following components:      Result Value   RBC 3.76 (*)    Hemoglobin 12.3 (*)    HCT 36.1 (*)    All other components within normal limits  COMPREHENSIVE METABOLIC PANEL - Abnormal; Notable for the following components:   Sodium 146 (*)    Potassium 3.0  (*)    Chloride 130 (*)    CO2 11 (*)    Glucose, Bld 51 (*)    Creatinine, Ser 0.53 (*)    Calcium 4.6 (*)    Total Protein 3.0 (*)    Albumin <1.5 (*)    AST 13 (*)    All other components within normal limits  I-STAT CHEM 8, ED - Abnormal; Notable for the following components:   BUN 25 (*)    All other components within normal limits  I-STAT VENOUS BLOOD GAS, ED - Abnormal; Notable for the following components:   pCO2, Ven 40.7 (*)    All other components within normal limits  URINALYSIS, ROUTINE W REFLEX MICROSCOPIC      Imaging Studies ordered: I ordered imaging studies including hip x-ray, CT L-spine, MRI CTL spine I independently visualized and interpreted imaging. I agree with the radiologist interpretation   Medicines ordered and prescription drug management: Meds ordered this encounter  Medications   oxyCODONE-acetaminophen (PERCOCET/ROXICET) 5-325 MG per tablet 2 tablet   calcium gluconate 1 g/ 50 mL sodium chloride IVPB   methylPREDNISolone (MEDROL DOSEPAK) 4 MG TBPK tablet    Sig: Take as prescribed    Dispense:  1 each    Refill:  0    -I have reviewed the patients home medicines and have made adjustments as needed  Critical interventions Electrolyte repletion, glucose   Social Determinants of Health:  Factors impacting patients care include: none   Reevaluation: After the interventions noted above, I reevaluated the patient and found that they have :improved  Co morbidities that complicate the patient evaluation  Past Medical History:  Diagnosis Date   Cellulitis 07/17/2020   LEFT HIP   Diabetes mellitus    Hyperlipidemia    Hypertension       Dispostion: I considered admission for this patient, the patient currently does not meet inpatient criteria for admission with correction of his electrolytes and is safe for discharge with outpatient follow-up     Final Clinical Impression(s) / ED Diagnoses Final diagnoses:  Fall, initial  encounter  Lumbar radiculopathy     @PCDICTATION @    Glendora ScoreKommor, Allisson Schindel, MD 09/01/22 2317

## 2022-09-01 NOTE — ED Notes (Signed)
[facility]tar called, 5th in line

## 2022-09-01 NOTE — ED Triage Notes (Signed)
Pt BIB [facility] from home due to sliding off the wheelchair and falling out.  Pt complains of lower back pain & right hip pain 9/10 and denies LOC/head trauma.  Pt is AOX3.  Hx of HTN and diabetes.  VS BP 116/74, CBG 141, sinus tachy 108.  18g left AC.

## 2023-03-26 ENCOUNTER — Other Ambulatory Visit: Payer: Self-pay

## 2023-03-26 ENCOUNTER — Inpatient Hospital Stay (HOSPITAL_COMMUNITY)
Admission: EM | Admit: 2023-03-26 | Discharge: 2023-04-01 | DRG: 481 | Disposition: A | Discharge status: Skilled Nursing Facility | Payer: 59 | Attending: Internal Medicine | Admitting: Internal Medicine

## 2023-03-26 ENCOUNTER — Encounter (HOSPITAL_COMMUNITY): Payer: Self-pay

## 2023-03-26 ENCOUNTER — Emergency Department (HOSPITAL_COMMUNITY): Payer: 59

## 2023-03-26 DIAGNOSIS — D62 Acute posthemorrhagic anemia: Secondary | ICD-10-CM | POA: Diagnosis not present

## 2023-03-26 DIAGNOSIS — Z5329 Procedure and treatment not carried out because of patient's decision for other reasons: Secondary | ICD-10-CM | POA: Diagnosis present

## 2023-03-26 DIAGNOSIS — W050XXA Fall from non-moving wheelchair, initial encounter: Secondary | ICD-10-CM | POA: Diagnosis present

## 2023-03-26 DIAGNOSIS — E46 Unspecified protein-calorie malnutrition: Secondary | ICD-10-CM | POA: Diagnosis present

## 2023-03-26 DIAGNOSIS — R0602 Shortness of breath: Secondary | ICD-10-CM | POA: Diagnosis present

## 2023-03-26 DIAGNOSIS — E785 Hyperlipidemia, unspecified: Secondary | ICD-10-CM | POA: Diagnosis present

## 2023-03-26 DIAGNOSIS — E1165 Type 2 diabetes mellitus with hyperglycemia: Secondary | ICD-10-CM | POA: Diagnosis not present

## 2023-03-26 DIAGNOSIS — E11649 Type 2 diabetes mellitus with hypoglycemia without coma: Secondary | ICD-10-CM | POA: Diagnosis not present

## 2023-03-26 DIAGNOSIS — R053 Chronic cough: Secondary | ICD-10-CM | POA: Diagnosis present

## 2023-03-26 DIAGNOSIS — Y92003 Bedroom of unspecified non-institutional (private) residence as the place of occurrence of the external cause: Secondary | ICD-10-CM

## 2023-03-26 DIAGNOSIS — I10 Essential (primary) hypertension: Secondary | ICD-10-CM | POA: Diagnosis present

## 2023-03-26 DIAGNOSIS — D539 Nutritional anemia, unspecified: Secondary | ICD-10-CM | POA: Diagnosis present

## 2023-03-26 DIAGNOSIS — F32A Depression, unspecified: Secondary | ICD-10-CM | POA: Diagnosis present

## 2023-03-26 DIAGNOSIS — M80052A Age-related osteoporosis with current pathological fracture, left femur, initial encounter for fracture: Principal | ICD-10-CM | POA: Diagnosis present

## 2023-03-26 DIAGNOSIS — S7290XA Unspecified fracture of unspecified femur, initial encounter for closed fracture: Secondary | ICD-10-CM | POA: Diagnosis not present

## 2023-03-26 DIAGNOSIS — S7292XA Unspecified fracture of left femur, initial encounter for closed fracture: Principal | ICD-10-CM

## 2023-03-26 DIAGNOSIS — D72829 Elevated white blood cell count, unspecified: Secondary | ICD-10-CM | POA: Diagnosis present

## 2023-03-26 DIAGNOSIS — F17210 Nicotine dependence, cigarettes, uncomplicated: Secondary | ICD-10-CM | POA: Diagnosis present

## 2023-03-26 DIAGNOSIS — Z79899 Other long term (current) drug therapy: Secondary | ICD-10-CM

## 2023-03-26 DIAGNOSIS — Z993 Dependence on wheelchair: Secondary | ICD-10-CM

## 2023-03-26 DIAGNOSIS — K5641 Fecal impaction: Secondary | ICD-10-CM | POA: Diagnosis not present

## 2023-03-26 DIAGNOSIS — Z888 Allergy status to other drugs, medicaments and biological substances status: Secondary | ICD-10-CM

## 2023-03-26 DIAGNOSIS — R64 Cachexia: Secondary | ICD-10-CM | POA: Diagnosis present

## 2023-03-26 DIAGNOSIS — T796XXA Traumatic ischemia of muscle, initial encounter: Secondary | ICD-10-CM | POA: Diagnosis present

## 2023-03-26 DIAGNOSIS — Z6831 Body mass index (BMI) 31.0-31.9, adult: Secondary | ICD-10-CM

## 2023-03-26 DIAGNOSIS — N179 Acute kidney failure, unspecified: Secondary | ICD-10-CM | POA: Diagnosis present

## 2023-03-26 DIAGNOSIS — Z751 Person awaiting admission to adequate facility elsewhere: Secondary | ICD-10-CM

## 2023-03-26 DIAGNOSIS — G8929 Other chronic pain: Secondary | ICD-10-CM | POA: Diagnosis present

## 2023-03-26 HISTORY — DX: Dyspnea, unspecified: R06.00

## 2023-03-26 LAB — CBC WITH DIFFERENTIAL/PLATELET
Abs Immature Granulocytes: 0.06 10*3/uL (ref 0.00–0.07)
Basophils Absolute: 0.1 10*3/uL (ref 0.0–0.1)
Basophils Relative: 1 %
Eosinophils Absolute: 0.1 10*3/uL (ref 0.0–0.5)
Eosinophils Relative: 0 %
HCT: 29 % — ABNORMAL LOW (ref 39.0–52.0)
Hemoglobin: 9.2 g/dL — ABNORMAL LOW (ref 13.0–17.0)
Immature Granulocytes: 0 %
Lymphocytes Relative: 9 %
Lymphs Abs: 1.3 10*3/uL (ref 0.7–4.0)
MCH: 32.9 pg (ref 26.0–34.0)
MCHC: 31.7 g/dL (ref 30.0–36.0)
MCV: 103.6 fL — ABNORMAL HIGH (ref 80.0–100.0)
Monocytes Absolute: 0.9 10*3/uL (ref 0.1–1.0)
Monocytes Relative: 7 %
Neutro Abs: 11.4 10*3/uL — ABNORMAL HIGH (ref 1.7–7.7)
Neutrophils Relative %: 83 %
Platelets: 201 10*3/uL (ref 150–400)
RBC: 2.8 MIL/uL — ABNORMAL LOW (ref 4.22–5.81)
RDW: 13.8 % (ref 11.5–15.5)
WBC: 13.8 10*3/uL — ABNORMAL HIGH (ref 4.0–10.5)
nRBC: 0 % (ref 0.0–0.2)

## 2023-03-26 LAB — TYPE AND SCREEN
ABO/RH(D): B POS
Antibody Screen: NEGATIVE

## 2023-03-26 LAB — COMPREHENSIVE METABOLIC PANEL
ALT: 18 U/L (ref 0–44)
AST: 37 U/L (ref 15–41)
Albumin: 3.1 g/dL — ABNORMAL LOW (ref 3.5–5.0)
Alkaline Phosphatase: 85 U/L (ref 38–126)
Anion gap: 15 (ref 5–15)
BUN: 51 mg/dL — ABNORMAL HIGH (ref 8–23)
CO2: 17 mmol/L — ABNORMAL LOW (ref 22–32)
Calcium: 8.6 mg/dL — ABNORMAL LOW (ref 8.9–10.3)
Chloride: 107 mmol/L (ref 98–111)
Creatinine, Ser: 1.55 mg/dL — ABNORMAL HIGH (ref 0.61–1.24)
GFR, Estimated: 49 mL/min — ABNORMAL LOW (ref >60)
Glucose, Bld: 171 mg/dL — ABNORMAL HIGH (ref 70–99)
Potassium: 4.8 mmol/L (ref 3.5–5.1)
Sodium: 139 mmol/L (ref 135–145)
Total Bilirubin: 1.6 mg/dL — ABNORMAL HIGH (ref 0.3–1.2)
Total Protein: 6.2 g/dL — ABNORMAL LOW (ref 6.5–8.1)

## 2023-03-26 LAB — CK: Total CK: 1907 U/L — ABNORMAL HIGH (ref 49–397)

## 2023-03-26 LAB — ABO/RH: ABO/RH(D): B POS

## 2023-03-26 LAB — GLUCOSE, CAPILLARY: Glucose-Capillary: 134 mg/dL — ABNORMAL HIGH (ref 70–99)

## 2023-03-26 MED ORDER — ENOXAPARIN SODIUM 60 MG/0.6ML IJ SOSY
50.0000 mg | PREFILLED_SYRINGE | INTRAMUSCULAR | Status: DC
  Administered 2023-03-28: 50 mg via SUBCUTANEOUS
  Filled 2023-03-26 (×2): qty 0.6

## 2023-03-26 MED ORDER — POLYETHYLENE GLYCOL 3350 17 G PO PACK
17.0000 g | PACK | Freq: Every day | ORAL | Status: DC
  Administered 2023-03-28 – 2023-03-30 (×3): 17 g via ORAL
  Filled 2023-03-26 (×4): qty 1

## 2023-03-26 MED ORDER — ACETAMINOPHEN 325 MG PO TABS
650.0000 mg | ORAL_TABLET | Freq: Four times a day (QID) | ORAL | Status: DC
  Administered 2023-03-26: 650 mg via ORAL
  Filled 2023-03-26: qty 2

## 2023-03-26 MED ORDER — HYDROMORPHONE HCL 1 MG/ML IJ SOLN
1.0000 mg | Freq: Once | INTRAMUSCULAR | Status: AC
  Administered 2023-03-26: 1 mg via INTRAVENOUS
  Filled 2023-03-26: qty 1

## 2023-03-26 MED ORDER — OXYCODONE HCL 5 MG PO TABS
10.0000 mg | ORAL_TABLET | ORAL | Status: DC | PRN
  Administered 2023-03-28 – 2023-03-29 (×5): 10 mg via ORAL
  Filled 2023-03-26 (×6): qty 2

## 2023-03-26 MED ORDER — MORPHINE SULFATE (PF) 2 MG/ML IV SOLN
1.0000 mg | INTRAVENOUS | Status: DC | PRN
  Administered 2023-03-26 (×2): 2 mg via INTRAVENOUS
  Filled 2023-03-26 (×2): qty 1

## 2023-03-26 MED ORDER — SENNOSIDES-DOCUSATE SODIUM 8.6-50 MG PO TABS
1.0000 | ORAL_TABLET | Freq: Every day | ORAL | Status: DC
  Administered 2023-03-27 – 2023-03-30 (×4): 1 via ORAL
  Filled 2023-03-26 (×7): qty 1

## 2023-03-26 MED ORDER — HYDROMORPHONE HCL 1 MG/ML IJ SOLN
0.5000 mg | INTRAMUSCULAR | Status: DC | PRN
  Administered 2023-03-27 – 2023-03-28 (×4): 0.5 mg via INTRAVENOUS
  Filled 2023-03-26 (×4): qty 0.5

## 2023-03-26 MED ORDER — LACTATED RINGERS IV BOLUS
1000.0000 mL | Freq: Once | INTRAVENOUS | Status: AC
  Administered 2023-03-26: 1000 mL via INTRAVENOUS

## 2023-03-26 MED ORDER — MORPHINE SULFATE (PF) 2 MG/ML IV SOLN: 2.0000 mg | Freq: Once | INTRAVENOUS | Status: DC

## 2023-03-26 MED ORDER — ACETAMINOPHEN 500 MG PO TABS
1000.0000 mg | ORAL_TABLET | Freq: Three times a day (TID) | ORAL | Status: DC
  Administered 2023-03-26 – 2023-03-31 (×12): 1000 mg via ORAL
  Filled 2023-03-26 (×15): qty 2

## 2023-03-26 NOTE — H&P (Addendum)
Date: 03/26/2023               Patient Name:  [name] MRN: [id number]  DOB: 1956-10-16 Age / Sex: 67 y.o., male   PCP: Patient, No Pcp Per         Medical Service: Internal Medicine Teaching Service         Attending Physician: Dr. Lottie Mussel, MD      First Contact: Dr. Leigh Aurora, DO Pager [phone]    Second Contact: Dr. Sanjuan Dame, MD Pager [phone]         After Hours (After 5p/  First Contact Pager: [phone]  weekends / holidays): Second Contact Pager: [phone]   SUBJECTIVE   Chief Complaint: Left leg pain  History of Present Illness: This is a 67 year old male with a past medical history of type 2 diabetes and hypertension who presents to the emergency department with concerns of left leg pain.  Patient states that on Sunday, 03/22/2023 he was trying to get out of bed and slipped, and got his leg trapped under the bed.  He states he felt a crack at the time in his left leg.  He states he then crawled to the other side of the bed to call the fire department to get him up.  He states the fire department came in 1 hour, and they were will to get him up into his wheelchair.  Since then he states that his left leg started swelling up.  He states he has been having pain as well.  He states he cannot take the pain anymore, and decided to come to the emergency department today.  He denies any chest pain, fever, chills or any other concerns.  He does state that he developed some shortness of breath 2 days ago.  He denies any wheezing.  He denies any cough or sputum production.  ED Course: Patient presented to the emergency room via ambulance today.  Initial vital signs showing Temp 97.5 F, respiratory rate 18, satting 99% on room air.  Blood pressure 116/50.  Labs showed minor elevation in CK as well as minor elevation in serum creatinine.  Imaging showed comminuted, displaced and angulated distal left femoral shaft fracture.  Ortho consulted in the emergency department.  IMTS  consulted for admission.  Past Medical History Past Medical History:  Diagnosis Date   Cellulitis 07/17/2020   LEFT HIP   Diabetes mellitus    Hyperlipidemia    Hypertension      Meds:  Tizanidine 4 mg daily Losartan 25 mg daily Gabapentin 300 mg 3 times daily Oxycodone 10 mg every 6 hours as needed Tylenol  Past Surgical History  Past Surgical History:  Procedure Laterality Date   SPINE SURGERY      Social:  Lives With: Alone Occupation: Retired Nurse, learning disability Support: Good support in his nephew, makes his own decisions Level of Function: Independent in all ADLs and IADLs PCP: Does not have one  Substances: 2 to 3 cigarettes for the past few years, but overall smoke half a pack a day for the past 30 years.  Family History: No pertinent family history  Allergies: Allergies as of 03/26/2023 - Review Complete 03/26/2023  Allergen Reaction Noted   Lipitor [atorvastatin calcium] Other (See Comments) 01/28/2012   Review of Systems: A complete ROS was negative except as per HPI.   OBJECTIVE:   Physical Exam: Blood pressure 118/66, pulse 88, temperature (!) 97.5 F (36.4 C), temperature source Oral, resp. rate 17, height   (1.778 m), weight 100.7 kg, SpO2 99 %.  Constitutional: Resting in bed, no acute distress HENT: normocephalic atraumatic Cardiovascular: regular rate and rhythm, no m/r/g Pulmonary/Chest: normal work of breathing on room air, lungs clear to auscultation bilaterally Abdominal: soft, non-tender, non-distended Extremity: Left lower extremity deformity appreciated and looks internally rotated.  2+ pedal pulses bilaterally.  Able to move toes bilaterally.  Labs: CBC    Component Value Date/Time   WBC 13.8 (H) 03/26/2023 1411   RBC 2.80 (L) 03/26/2023 1411   HGB 9.2 (L) 03/26/2023 1411   HCT 29.0 (L) 03/26/2023 1411   PLT 201 03/26/2023 1411   MCV 103.6 (H) 03/26/2023 1411   MCV 87.3 01/28/2012 1409   MCH 32.9 03/26/2023 1411   MCHC 31.7  03/26/2023 1411   RDW 13.8 03/26/2023 1411   LYMPHSABS 1.3 03/26/2023 1411   MONOABS 0.9 03/26/2023 1411   EOSABS 0.1 03/26/2023 1411   BASOSABS 0.1 03/26/2023 1411     CMP     Component Value Date/Time   NA 139 03/26/2023 1411   K 4.8 03/26/2023 1411   CL 107 03/26/2023 1411   CO2 17 (L) 03/26/2023 1411   GLUCOSE 171 (H) 03/26/2023 1411   BUN 51 (H) 03/26/2023 1411   CREATININE 1.55 (H) 03/26/2023 1411   CREATININE 1.09 01/28/2012 1406   CALCIUM 8.6 (L) 03/26/2023 1411   PROT 6.2 (L) 03/26/2023 1411   ALBUMIN 3.1 (L) 03/26/2023 1411   AST 37 03/26/2023 1411   ALT 18 03/26/2023 1411   ALKPHOS 85 03/26/2023 1411   BILITOT 1.6 (H) 03/26/2023 1411   GFRNONAA 49 (L) 03/26/2023 1411   GFRAA >60 09/12/2020 1500    Imaging:  Left femur x-ray: Comminuted, displaced and angulated distal left femoral shaft fracture.  Pelvis x-ray: No obvious acute displaced fracture.    Chest x-ray: No acute cardiopulmonary process  CT head: No acute intracranial abnormalities  CT cervical spine: Degenerative disc disease, no acute bony abnormalities.  EKG: Pending  ASSESSMENT & PLAN:   Assessment & Plan by Problem: Principal Problem:   Closed femur fracture   [name] is a 67 y.o. male with a past medical history of type 2 diabetes and hypertension presents for left lower extremity pain.  Patient found to have left femur fracture, and admitted for further evaluation and management.  #Closed comminuted, displaced and angulated distal left femoral shaft fracture Patient presented after a fall.  Patient not have any syncopal episode.  Seems likely mechanical fall.  Patient stated that he heard a crack. Imaging confirming the same with femur fracture.  Ortho was consulted in the emergency department.  Plan for surgery while inpatient.  Patient remains hemodynamically stable at this time.  Will control pain with patient inpatient.  -Start pain regimen with Tylenol 1000 mg every 8  hours -Hydromorphone 0.5 mg every 4 hours. -Oxycodone 10 mg every 4 hours as needed -Bowel regimen with MiraLAX and Senokot  #AKI Patient has creatinine of 1.55.  This is likely prerenal.  Did get a liter in the ED. -Encourage p.o. intake -Follow up BMP   #Hypertension Patient takes losartan 25 mg at home.  Blood pressure here stable. -Hold home losartan 25 mg daily -Monitor blood pressure  #Type 2 diabetes mellitus We will obtain A1c here.  Previous A1c 2 years ago at 6.9. -Repeat A1c pending  Diet: Normal VTE: Enoxaparin IVF: None,None Code: Full  Prior to Admission Living Arrangement: Home, living alone Anticipated Discharge Location: Home  Barriers to Discharge: Clinical improvement  Dispo: Admit patient to Observation with expected length of stay less than 2 midnights.  Signed: Leigh Aurora, DO Internal Medicine Resident PGY-1 Pager: [phone] 03/26/2023, 6:32 PM   On weekends or after 5pm please page on call intern or resident: First contact: [phone] If no answer in 15 minutes, please contact senior pager at [phone]

## 2023-03-26 NOTE — ED Provider Notes (Signed)
[facility] Provider Note  CSN: [id number] Arrival date & time: 03/26/23  1333  History  Chief Complaint  Patient presents with   [name] is a 67 y.o. male.  [name] is a 67 year old wheelchair-bound man who presents via EMS with femur injury s/p fall. He reports he fell on Sunday when attempting to transfer from his wheelchair.  Reportedly was trapped under the bed and had to pull himself out. Firefighters came to his house and provided lift assistance, however EMS was canceled.  Unclear why EMS was canceled, but EMS relates that patient's nephew on scene told them the patient is quite resistant to going to the hospital and likely told the firefighters that EMS was not needed.  Per patient, he has been laying around the house and pain.  Attempted to treat pain with his home pain meds of oxycodone acetaminophen 10-300 mg tablets without much relief.  Unclear exactly why patient called EMS today, unable to determine if there was a worsening of the pain or reinjury.   Home Medications Prior to Admission medications   Medication Sig Start Date End Date Taking? Authorizing Provider  escitalopram (LEXAPRO) 10 MG tablet Take 1 tablet (10 mg total) by mouth daily. Patient not taking: Reported on 05/13/2021 09/18/20   Freida Busman, MD  methylPREDNISolone (MEDROL DOSEPAK) 4 MG TBPK tablet Take as prescribed 09/01/22   Kommor, Debe Coder, MD  Multiple Vitamin (MULTIVITAMIN WITH MINERALS) TABS tablet Take 1 tablet by mouth daily. Patient not taking: Reported on 05/13/2021 09/18/20   Freida Busman, MD  Nutritional Supplements (ENSURE PO) Take 1 Bottle by mouth daily with breakfast.    [provider]  oxycodone-acetaminophen (LYNOX) 10-300 MG tablet Take 1 tablet by mouth every 6 (six) hours as needed for pain.    [provider]  tiZANidine (ZANAFLEX) 4 MG capsule Take 4 mg by mouth 3 (three) times daily.    [provider]  Triamcinolone Acetonide (TRIAMCINOLONE 0.1 % CREAM : EUCERIN) CREA Apply 1 application topically 3 (three) times daily. Patient not taking: Reported on 05/13/2021 09/17/20   Freida Busman, MD     Allergies    Lipitor [atorvastatin calcium]    Review of Systems   Review of Systems  Constitutional:  Positive for activity change. Negative for appetite change, chills, diaphoresis, fatigue and fever.  HENT:  Negative for congestion, rhinorrhea, sinus pressure and sore throat.   Respiratory:  Negative for apnea, cough, chest tightness, shortness of breath and wheezing.   Cardiovascular:  Negative for chest pain.  Gastrointestinal:  Negative for abdominal distention, abdominal pain, constipation, diarrhea, nausea and vomiting.  Musculoskeletal:  Positive for gait problem.  Skin:  Positive for wound.  Neurological:  Negative for dizziness, seizures, syncope, weakness, light-headedness, numbness and headaches.   Physical Exam Updated Vital Signs BP (!) 116/50 (BP Location: Left Arm)   Pulse 88   Temp (!) 97.5 F (36.4 C) (Oral)   Resp 18   Ht 5\' 10"  (1.778 m)   Wt 100.7 kg   SpO2 100%   BMI 31.85 kg/m  Physical Exam Vitals and nursing note reviewed. Exam conducted with a chaperone present.  Constitutional:      General: He is not in acute distress.    Appearance: He is cachectic. He is not ill-appearing, toxic-appearing or diaphoretic.     Comments: Disheveled    HENT:     Head: Normocephalic and  atraumatic.  Cardiovascular:     Rate and Rhythm: Normal rate and regular rhythm.     Pulses: Normal pulses.     Heart sounds: Normal heart sounds. No murmur heard. Pulmonary:     Effort: Pulmonary effort is normal.     Breath sounds: Normal breath sounds. No wheezing, rhonchi or rales.  Chest:     Chest wall: No tenderness.  Abdominal:     General: Abdomen is flat. Bowel sounds are normal. There is no distension.     Palpations: Abdomen is soft.     Tenderness: There  is no abdominal tenderness. There is no guarding or rebound.  Musculoskeletal:        General: Swelling, tenderness, deformity and signs of injury present.     Right upper leg: Normal.     Left upper leg: Swelling and deformity present.       Legs:  Neurological:     Mental Status: He is alert.     ED Results / Procedures / Treatments   Labs (all labs ordered are listed, but only abnormal results are displayed) Labs Reviewed  CBC WITH DIFFERENTIAL/PLATELET - Abnormal; Notable for the following components:      Result Value   WBC 13.8 (*)    RBC 2.80 (*)    Hemoglobin 9.2 (*)    HCT 29.0 (*)    MCV 103.6 (*)    Neutro Abs 11.4 (*)    All other components within normal limits  COMPREHENSIVE METABOLIC PANEL - Abnormal; Notable for the following components:   CO2 17 (*)    Glucose, Bld 171 (*)    BUN 51 (*)    Creatinine, Ser 1.55 (*)    Calcium 8.6 (*)    Total Protein 6.2 (*)    Albumin 3.1 (*)    Total Bilirubin 1.6 (*)    GFR, Estimated 49 (*)    All other components within normal limits  CK - Abnormal; Notable for the following components:   Total CK 1,907 (*)    All other components within normal limits  TYPE AND SCREEN   EKG EKG Interpretation  Date/Time:  Thursday March 26 2023 14:31:09 EDT Ventricular Rate:  88 PR Interval:  146 QRS Duration: 88 QT Interval:  370 QTC Calculation: 447 R Axis:   86 Text Interpretation: Normal sinus rhythm Septal infarct , age undetermined Abnormal ECG When compared with ECG of 05-Sep-2020 14:07, PREVIOUS ECG IS PRESENT Confirmed by Nanda Quinton [phone]) on 03/26/2023 2:48:26 PM  Radiology No results found.  Procedures Procedures   Medications Ordered in ED Medications  morphine (PF) 2 MG/ML injection 1-2 mg (2 mg Intravenous Given 03/26/23 1431)  acetaminophen (TYLENOL) tablet 650 mg (650 mg Oral Given 03/26/23 1431)   ED Course/ Medical Decision Making/ A&P  Medical Decision Making 67 yo wheel chair bound man here with  left femur deformity s/p fall 4 days ago.  With obvious deformity and swelling, expect closed displaced midshaft fracture.  Given the amount of swelling and tenderness around the mid thigh area, along with 4-day duration, did not apply traction at this time.  Will provide pain relief with Tylenol and IV morphine prior to obtaining x-rays.  Plan for orthopedic admission, will call after results are back.  Will also obtain CBC, CMP, CK, type and screen, CXR, XR pelvis for preoperative preparation.  CT head and C-spine to evaluate for injuries from fall.  3 pm: CBC has returned with mild leukocytosis and a macrocytic anemia.  Awaiting other labs.  Patient has arrived in radiology department for x-ray.  Will sign out to oncoming ED provider, who will consult orthopedics after we have scans back.  Expect admission to hospital for surgical intervention.  Amount and/or Complexity of Data Reviewed Labs: ordered. Decision-making details documented in ED Course.    Details: CBC, CMP, CK, type and screen Radiology: ordered. Decision-making details documented in ED Course.    Details: CXR, XR pelvis, CT head, CT C-spine ECG/medicine tests: ordered. Decision-making details documented in ED Course.    Details: EKG  Risk OTC drugs. Prescription drug management.   Final Clinical Impression(s) / ED Diagnoses Final diagnoses:  Closed fracture of left femur, unspecified fracture morphology, unspecified portion of femur, initial encounter   Rx / DC Orders ED Discharge Orders     None      Fayette Pho, MD   Fayette Pho, MD 03/26/23 2595    Maia Plan, MD 03/27/23 [phone]

## 2023-03-26 NOTE — Progress Notes (Signed)
Rush Landmark, Nephew [phone]

## 2023-03-26 NOTE — ED Provider Notes (Signed)
  Physical Exam  BP (!) 116/50 (BP Location: Left Arm)   Pulse 88   Temp (!) 97.5 F (36.4 C) (Oral)   Resp 18   Ht 5\' 10"  (1.778 m)   Wt 100.7 kg   SpO2 100%   BMI 31.85 kg/m   ED Course / MDM    Medical Decision Making Amount and/or Complexity of Data Reviewed Labs: ordered. Radiology: ordered.  Risk OTC drugs. Prescription drug management. Decision regarding hospitalization.   Patient from previous provider, had a fall 3 days ago that he is now presenting for with obvious left femur deformity.  Awaiting imaging.   X-ray did confirm left femur fracture.  Reviewing labs, patient also has notable rhabdomyolysis as well as AKI 1 L LR bolus.  Discussed with Ortho who w wants patient admitted for possible surgery tomorrow.  Patient was admitted to medicine team for further observation and care.       Maryruth Eve, MD 03/26/23 5176    Mardene Sayer, MD 03/27/23 1134
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
## 2023-03-26 NOTE — ED Notes (Signed)
Messaged MD for better pain management

## 2023-03-26 NOTE — ED Notes (Signed)
Patient transported to X-ray 
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
## 2023-03-26 NOTE — ED Notes (Signed)
Urinal provided.

## 2023-03-27 ENCOUNTER — Observation Stay (HOSPITAL_COMMUNITY): Payer: 59 | Admitting: Anesthesiology

## 2023-03-27 ENCOUNTER — Encounter (HOSPITAL_COMMUNITY)
Admission: EM | Disposition: A | Discharge status: Skilled Nursing Facility | Payer: Self-pay | Source: Home / Self Care | Attending: Internal Medicine

## 2023-03-27 ENCOUNTER — Encounter (HOSPITAL_COMMUNITY): Payer: Self-pay | Admitting: Internal Medicine

## 2023-03-27 ENCOUNTER — Observation Stay (HOSPITAL_COMMUNITY): Payer: 59

## 2023-03-27 ENCOUNTER — Other Ambulatory Visit: Payer: Self-pay

## 2023-03-27 ENCOUNTER — Inpatient Hospital Stay (HOSPITAL_COMMUNITY): Payer: 59

## 2023-03-27 DIAGNOSIS — R053 Chronic cough: Secondary | ICD-10-CM | POA: Diagnosis present

## 2023-03-27 DIAGNOSIS — R0602 Shortness of breath: Secondary | ICD-10-CM | POA: Diagnosis present

## 2023-03-27 DIAGNOSIS — K5641 Fecal impaction: Secondary | ICD-10-CM | POA: Diagnosis not present

## 2023-03-27 DIAGNOSIS — Z79899 Other long term (current) drug therapy: Secondary | ICD-10-CM | POA: Diagnosis not present

## 2023-03-27 DIAGNOSIS — Y92003 Bedroom of unspecified non-institutional (private) residence as the place of occurrence of the external cause: Secondary | ICD-10-CM | POA: Diagnosis not present

## 2023-03-27 DIAGNOSIS — S7292XA Unspecified fracture of left femur, initial encounter for closed fracture: Secondary | ICD-10-CM | POA: Diagnosis not present

## 2023-03-27 DIAGNOSIS — N179 Acute kidney failure, unspecified: Secondary | ICD-10-CM | POA: Diagnosis present

## 2023-03-27 DIAGNOSIS — W050XXA Fall from non-moving wheelchair, initial encounter: Secondary | ICD-10-CM | POA: Diagnosis present

## 2023-03-27 DIAGNOSIS — E11649 Type 2 diabetes mellitus with hypoglycemia without coma: Secondary | ICD-10-CM | POA: Diagnosis not present

## 2023-03-27 DIAGNOSIS — D62 Acute posthemorrhagic anemia: Secondary | ICD-10-CM | POA: Diagnosis not present

## 2023-03-27 DIAGNOSIS — S728X2A Other fracture of left femur, initial encounter for closed fracture: Secondary | ICD-10-CM | POA: Diagnosis not present

## 2023-03-27 DIAGNOSIS — E119 Type 2 diabetes mellitus without complications: Secondary | ICD-10-CM | POA: Diagnosis not present

## 2023-03-27 DIAGNOSIS — Z751 Person awaiting admission to adequate facility elsewhere: Secondary | ICD-10-CM | POA: Diagnosis not present

## 2023-03-27 DIAGNOSIS — D539 Nutritional anemia, unspecified: Secondary | ICD-10-CM | POA: Diagnosis present

## 2023-03-27 DIAGNOSIS — F17210 Nicotine dependence, cigarettes, uncomplicated: Secondary | ICD-10-CM

## 2023-03-27 DIAGNOSIS — Z5329 Procedure and treatment not carried out because of patient's decision for other reasons: Secondary | ICD-10-CM | POA: Diagnosis present

## 2023-03-27 DIAGNOSIS — Z6831 Body mass index (BMI) 31.0-31.9, adult: Secondary | ICD-10-CM | POA: Diagnosis not present

## 2023-03-27 DIAGNOSIS — D72829 Elevated white blood cell count, unspecified: Secondary | ICD-10-CM | POA: Diagnosis present

## 2023-03-27 DIAGNOSIS — I10 Essential (primary) hypertension: Secondary | ICD-10-CM | POA: Diagnosis present

## 2023-03-27 DIAGNOSIS — Z888 Allergy status to other drugs, medicaments and biological substances status: Secondary | ICD-10-CM | POA: Diagnosis not present

## 2023-03-27 DIAGNOSIS — T796XXA Traumatic ischemia of muscle, initial encounter: Secondary | ICD-10-CM | POA: Insufficient documentation

## 2023-03-27 DIAGNOSIS — E785 Hyperlipidemia, unspecified: Secondary | ICD-10-CM | POA: Diagnosis present

## 2023-03-27 DIAGNOSIS — Z993 Dependence on wheelchair: Secondary | ICD-10-CM | POA: Diagnosis not present

## 2023-03-27 DIAGNOSIS — M80052A Age-related osteoporosis with current pathological fracture, left femur, initial encounter for fracture: Secondary | ICD-10-CM | POA: Diagnosis present

## 2023-03-27 DIAGNOSIS — F32A Depression, unspecified: Secondary | ICD-10-CM | POA: Diagnosis present

## 2023-03-27 DIAGNOSIS — S7290XA Unspecified fracture of unspecified femur, initial encounter for closed fracture: Secondary | ICD-10-CM | POA: Diagnosis present

## 2023-03-27 DIAGNOSIS — R64 Cachexia: Secondary | ICD-10-CM | POA: Diagnosis present

## 2023-03-27 DIAGNOSIS — E1165 Type 2 diabetes mellitus with hyperglycemia: Secondary | ICD-10-CM | POA: Diagnosis not present

## 2023-03-27 DIAGNOSIS — E46 Unspecified protein-calorie malnutrition: Secondary | ICD-10-CM | POA: Diagnosis present

## 2023-03-27 DIAGNOSIS — G8929 Other chronic pain: Secondary | ICD-10-CM | POA: Diagnosis present

## 2023-03-27 HISTORY — PX: FEMUR IM NAIL: SHX1597

## 2023-03-27 LAB — RENAL FUNCTION PANEL
Albumin: 3.2 g/dL — ABNORMAL LOW (ref 3.5–5.0)
Anion gap: 10 (ref 5–15)
BUN: 45 mg/dL — ABNORMAL HIGH (ref 8–23)
CO2: 23 mmol/L (ref 22–32)
Calcium: 8.4 mg/dL — ABNORMAL LOW (ref 8.9–10.3)
Chloride: 106 mmol/L (ref 98–111)
Creatinine, Ser: 1.21 mg/dL (ref 0.61–1.24)
GFR, Estimated: >60 mL/min (ref >60)
Glucose, Bld: 188 mg/dL — ABNORMAL HIGH (ref 70–99)
Phosphorus: 3.4 mg/dL (ref 2.5–4.6)
Potassium: 4.4 mmol/L (ref 3.5–5.1)
Sodium: 139 mmol/L (ref 135–145)

## 2023-03-27 LAB — PROTIME-INR
INR: 1.2 (ref 0.8–1.2)
Prothrombin Time: 15 seconds (ref 11.4–15.2)

## 2023-03-27 LAB — CBC
HCT: 23.1 % — ABNORMAL LOW (ref 39.0–52.0)
Hemoglobin: 7.6 g/dL — ABNORMAL LOW (ref 13.0–17.0)
MCH: 32.3 pg (ref 26.0–34.0)
MCHC: 32.9 g/dL (ref 30.0–36.0)
MCV: 98.3 fL (ref 80.0–100.0)
Platelets: 232 10*3/uL (ref 150–400)
RBC: 2.35 MIL/uL — ABNORMAL LOW (ref 4.22–5.81)
RDW: 13.8 % (ref 11.5–15.5)
WBC: 14.1 10*3/uL — ABNORMAL HIGH (ref 4.0–10.5)
nRBC: 0 % (ref 0.0–0.2)

## 2023-03-27 LAB — HEMOGLOBIN A1C
Hgb A1c MFr Bld: 5.7 % — ABNORMAL HIGH (ref 4.8–5.6)
Mean Plasma Glucose: 116.89 mg/dL

## 2023-03-27 LAB — GLUCOSE, CAPILLARY
Glucose-Capillary: 139 mg/dL — ABNORMAL HIGH (ref 70–99)
Glucose-Capillary: 142 mg/dL — ABNORMAL HIGH (ref 70–99)

## 2023-03-27 LAB — SURGICAL PCR SCREEN
MRSA, PCR: NEGATIVE
Staphylococcus aureus: NEGATIVE

## 2023-03-27 LAB — VITAMIN B12: Vitamin B-12: 432 pg/mL (ref 180–914)

## 2023-03-27 LAB — FOLATE: Folate: 17.9 ng/mL (ref >5.9)

## 2023-03-27 LAB — CK: Total CK: 2213 U/L — ABNORMAL HIGH (ref 49–397)

## 2023-03-27 LAB — VITAMIN D 25 HYDROXY (VIT D DEFICIENCY, FRACTURES): Vit D, 25-Hydroxy: 32.1 ng/mL (ref 30–100)

## 2023-03-27 LAB — HIV ANTIBODY (ROUTINE TESTING W REFLEX): HIV Screen 4th Generation wRfx: NONREACTIVE

## 2023-03-27 SURGERY — INSERTION, INTRAMEDULLARY ROD, FEMUR, RETROGRADE
Anesthesia: General | Site: Leg Upper | Laterality: Left

## 2023-03-27 MED ORDER — ONDANSETRON HCL 4 MG/2ML IJ SOLN
INTRAMUSCULAR | Status: DC | PRN
  Administered 2023-03-27: 4 mg via INTRAVENOUS

## 2023-03-27 MED ORDER — FENTANYL CITRATE (PF) 250 MCG/5ML IJ SOLN
INTRAMUSCULAR | Status: DC | PRN
  Administered 2023-03-27 (×2): 50 ug via INTRAVENOUS

## 2023-03-27 MED ORDER — CHLORHEXIDINE GLUCONATE 4 % EX LIQD: 60.0000 mL | Freq: Once | CUTANEOUS | Status: DC

## 2023-03-27 MED ORDER — ALBUMIN HUMAN 5 % IV SOLN: INTRAVENOUS | Status: DC | PRN

## 2023-03-27 MED ORDER — POVIDONE-IODINE 10 % EX SWAB
2.0000 | Freq: Once | CUTANEOUS | Status: AC
  Administered 2023-03-27: 2 via TOPICAL

## 2023-03-27 MED ORDER — FENTANYL CITRATE (PF) 100 MCG/2ML IJ SOLN
50.0000 ug | Freq: Once | INTRAMUSCULAR | Status: AC
  Administered 2023-03-27: 50 ug via INTRAVENOUS

## 2023-03-27 MED ORDER — 0.9 % SODIUM CHLORIDE (POUR BTL) OPTIME
TOPICAL | Status: DC | PRN
  Administered 2023-03-27: 1000 mL

## 2023-03-27 MED ORDER — OXYCODONE HCL 5 MG/5ML PO SOLN: 5.0000 mg | Freq: Once | ORAL | Status: AC | PRN

## 2023-03-27 MED ORDER — PHENYLEPHRINE 80 MCG/ML (10ML) SYRINGE FOR IV PUSH (FOR BLOOD PRESSURE SUPPORT)
PREFILLED_SYRINGE | INTRAVENOUS | Status: DC | PRN
  Administered 2023-03-27 (×8): 80 ug via INTRAVENOUS

## 2023-03-27 MED ORDER — LACTATED RINGERS IV SOLN: INTRAVENOUS | Status: AC

## 2023-03-27 MED ORDER — FENTANYL CITRATE (PF) 100 MCG/2ML IJ SOLN
INTRAMUSCULAR | Status: AC
  Filled 2023-03-27: qty 2

## 2023-03-27 MED ORDER — CEFAZOLIN SODIUM-DEXTROSE 2-4 GM/100ML-% IV SOLN
2.0000 g | Freq: Three times a day (TID) | INTRAVENOUS | Status: AC
  Administered 2023-03-27 – 2023-03-28 (×3): 2 g via INTRAVENOUS
  Filled 2023-03-27 (×3): qty 100

## 2023-03-27 MED ORDER — FENTANYL CITRATE (PF) 100 MCG/2ML IJ SOLN
25.0000 ug | INTRAMUSCULAR | Status: DC | PRN
  Administered 2023-03-27 (×2): 50 ug via INTRAVENOUS

## 2023-03-27 MED ORDER — EPHEDRINE SULFATE-NACL 50-0.9 MG/10ML-% IV SOSY
PREFILLED_SYRINGE | INTRAVENOUS | Status: DC | PRN
  Administered 2023-03-27: 10 mg via INTRAVENOUS
  Administered 2023-03-27: 15 mg via INTRAVENOUS

## 2023-03-27 MED ORDER — INSULIN ASPART 100 UNIT/ML IJ SOLN: 0.0000 [IU] | INTRAMUSCULAR | Status: DC | PRN

## 2023-03-27 MED ORDER — ACETAMINOPHEN 10 MG/ML IV SOLN
1000.0000 mg | Freq: Once | INTRAVENOUS | Status: AC
  Administered 2023-03-27: 1000 mg via INTRAVENOUS

## 2023-03-27 MED ORDER — CHLORHEXIDINE GLUCONATE 0.12 % MT SOLN: 15.0000 mL | Freq: Once | OROMUCOSAL | Status: AC

## 2023-03-27 MED ORDER — FENTANYL CITRATE (PF) 100 MCG/2ML IJ SOLN
INTRAMUSCULAR | Status: AC
  Administered 2023-03-27: 50 ug via INTRAVENOUS
  Filled 2023-03-27: qty 2

## 2023-03-27 MED ORDER — CEFAZOLIN SODIUM-DEXTROSE 2-4 GM/100ML-% IV SOLN
2.0000 g | INTRAVENOUS | Status: AC
  Administered 2023-03-27: 2 g via INTRAVENOUS

## 2023-03-27 MED ORDER — MEPERIDINE HCL 25 MG/ML IJ SOLN: 6.2500 mg | INTRAMUSCULAR | Status: DC | PRN

## 2023-03-27 MED ORDER — ACETAMINOPHEN 325 MG PO TABS: 325.0000 mg | ORAL_TABLET | ORAL | Status: DC | PRN

## 2023-03-27 MED ORDER — LACTATED RINGERS IV SOLN: INTRAVENOUS | Status: DC

## 2023-03-27 MED ORDER — SUGAMMADEX SODIUM 200 MG/2ML IV SOLN
INTRAVENOUS | Status: DC | PRN
  Administered 2023-03-27: 200 mg via INTRAVENOUS

## 2023-03-27 MED ORDER — FENTANYL CITRATE (PF) 100 MCG/2ML IJ SOLN: 50.0000 ug | Freq: Once | INTRAMUSCULAR | Status: AC

## 2023-03-27 MED ORDER — OXYCODONE HCL 5 MG PO TABS
5.0000 mg | ORAL_TABLET | Freq: Once | ORAL | Status: AC | PRN
  Administered 2023-03-27: 5 mg via ORAL

## 2023-03-27 MED ORDER — ORAL CARE MOUTH RINSE: 15.0000 mL | Freq: Once | OROMUCOSAL | Status: AC

## 2023-03-27 MED ORDER — LIDOCAINE 2% (20 MG/ML) 5 ML SYRINGE
INTRAMUSCULAR | Status: DC | PRN
  Administered 2023-03-27: 100 mg via INTRAVENOUS

## 2023-03-27 MED ORDER — DEXAMETHASONE SODIUM PHOSPHATE 10 MG/ML IJ SOLN
INTRAMUSCULAR | Status: DC | PRN
  Administered 2023-03-27: 10 mg via INTRAVENOUS

## 2023-03-27 MED ORDER — MIDAZOLAM HCL 2 MG/2ML IJ SOLN
INTRAMUSCULAR | Status: AC
  Filled 2023-03-27: qty 2

## 2023-03-27 MED ORDER — PROPOFOL 10 MG/ML IV BOLUS
INTRAVENOUS | Status: AC
  Filled 2023-03-27: qty 20

## 2023-03-27 MED ORDER — PROPOFOL 10 MG/ML IV BOLUS
INTRAVENOUS | Status: DC | PRN
  Administered 2023-03-27: 100 mg via INTRAVENOUS

## 2023-03-27 MED ORDER — ACETAMINOPHEN 160 MG/5ML PO SOLN: 325.0000 mg | ORAL | Status: DC | PRN

## 2023-03-27 MED ORDER — CHLORHEXIDINE GLUCONATE 0.12 % MT SOLN
OROMUCOSAL | Status: AC
  Administered 2023-03-27: 15 mL via OROMUCOSAL
  Filled 2023-03-27: qty 15

## 2023-03-27 MED ORDER — ROCURONIUM BROMIDE 10 MG/ML (PF) SYRINGE
PREFILLED_SYRINGE | INTRAVENOUS | Status: DC | PRN
  Administered 2023-03-27: 70 mg via INTRAVENOUS

## 2023-03-27 MED ORDER — ONDANSETRON HCL 4 MG/2ML IJ SOLN: 4.0000 mg | Freq: Once | INTRAMUSCULAR | Status: DC | PRN

## 2023-03-27 MED ORDER — FENTANYL CITRATE (PF) 250 MCG/5ML IJ SOLN
INTRAMUSCULAR | Status: AC
  Filled 2023-03-27: qty 5

## 2023-03-27 MED ORDER — ACETAMINOPHEN 10 MG/ML IV SOLN
INTRAVENOUS | Status: AC
  Filled 2023-03-27: qty 100

## 2023-03-27 MED ORDER — MIDAZOLAM HCL 2 MG/2ML IJ SOLN
INTRAMUSCULAR | Status: DC | PRN
  Administered 2023-03-27: 2 mg via INTRAVENOUS

## 2023-03-27 MED ORDER — CEFAZOLIN SODIUM-DEXTROSE 2-4 GM/100ML-% IV SOLN
INTRAVENOUS | Status: AC
  Filled 2023-03-27: qty 100

## 2023-03-27 MED ORDER — OXYCODONE HCL 5 MG PO TABS
ORAL_TABLET | ORAL | Status: AC
  Administered 2023-03-28: 10 mg via ORAL
  Filled 2023-03-27: qty 1

## 2023-03-27 MED ORDER — PHENYLEPHRINE HCL-NACL 20-0.9 MG/250ML-% IV SOLN
INTRAVENOUS | Status: DC | PRN
  Administered 2023-03-27: 20 ug/min via INTRAVENOUS

## 2023-03-27 SURGICAL SUPPLY — 71 items
BAG COUNTER SPONGE SURGICOUNT (BAG) ×2 IMPLANT
BAG SPNG CNTER NS LX DISP (BAG)
BANDAGE ESMARK 6X9 LF (GAUZE/BANDAGES/DRESSINGS) IMPLANT
BIT DRILL CALIBRATED 4.3MMX365 (DRILL) IMPLANT
BIT DRILL CROWE PNT TWST 4.5MM (DRILL) IMPLANT
BNDG CMPR 9X6 STRL LF SNTH (GAUZE/BANDAGES/DRESSINGS)
BNDG COHESIVE 4X5 TAN STRL (GAUZE/BANDAGES/DRESSINGS) ×2 IMPLANT
BNDG ELASTIC 4X5.8 VLCR STR LF (GAUZE/BANDAGES/DRESSINGS) ×2 IMPLANT
BNDG ELASTIC 6X5.8 VLCR STR LF (GAUZE/BANDAGES/DRESSINGS) ×2 IMPLANT
BNDG ESMARK 6X9 LF (GAUZE/BANDAGES/DRESSINGS)
BNDG GAUZE DERMACEA FLUFF 4 (GAUZE/BANDAGES/DRESSINGS) ×2 IMPLANT
BNDG GZE DERMACEA 4 6PLY (GAUZE/BANDAGES/DRESSINGS)
BRUSH SCRUB EZ PLAIN DRY (MISCELLANEOUS) ×4 IMPLANT
COVER MAYO STAND STRL (DRAPES) ×2 IMPLANT
COVER SURGICAL LIGHT HANDLE (MISCELLANEOUS) ×4 IMPLANT
DRAPE C-ARM 42X72 X-RAY (DRAPES) ×2 IMPLANT
DRAPE C-ARMOR (DRAPES) ×2 IMPLANT
DRAPE IMP U-DRAPE 54X76 (DRAPES) ×2 IMPLANT
DRAPE INCISE IOBAN 66X45 STRL (DRAPES) ×2 IMPLANT
DRAPE ORTHO SPLIT 77X108 STRL (DRAPES) ×1
DRAPE SURG ORHT 6 SPLT 77X108 (DRAPES) ×4 IMPLANT
DRAPE U-SHAPE 47X51 STRL (DRAPES) ×2 IMPLANT
DRESSING MEPILEX FLEX 4X4 (GAUZE/BANDAGES/DRESSINGS) IMPLANT
DRILL CALIBRATED 4.3MMX365 (DRILL) ×1
DRILL CROWE POINT TWIST 4.5MM (DRILL) ×1
DRSG MEPILEX FLEX 4X4 (GAUZE/BANDAGES/DRESSINGS) ×3
ELECT REM PT RETURN 9FT ADLT (ELECTROSURGICAL) ×1
ELECTRODE REM PT RTRN 9FT ADLT (ELECTROSURGICAL) ×2 IMPLANT
EVACUATOR 1/8 PVC DRAIN (DRAIN) IMPLANT
GAUZE SPONGE 4X4 12PLY STRL (GAUZE/BANDAGES/DRESSINGS) ×2 IMPLANT
GAUZE XEROFORM 1X8 LF (GAUZE/BANDAGES/DRESSINGS) ×2 IMPLANT
GLOVE BIO SURGEON STRL SZ7.5 (GLOVE) ×2 IMPLANT
GLOVE BIO SURGEON STRL SZ8 (GLOVE) ×2 IMPLANT
GLOVE BIOGEL PI IND STRL 7.5 (GLOVE) ×2 IMPLANT
GLOVE BIOGEL PI IND STRL 8 (GLOVE) ×2 IMPLANT
GLOVE SURG ORTHO LTX SZ7.5 (GLOVE) ×4 IMPLANT
GOWN STRL REUS W/ TWL LRG LVL3 (GOWN DISPOSABLE) ×4 IMPLANT
GOWN STRL REUS W/ TWL XL LVL3 (GOWN DISPOSABLE) ×2 IMPLANT
GOWN STRL REUS W/TWL LRG LVL3 (GOWN DISPOSABLE) ×2
GOWN STRL REUS W/TWL XL LVL3 (GOWN DISPOSABLE) ×1
GUIDEPIN VERSANAIL DSP 3.2X444 (ORTHOPEDIC DISPOSABLE SUPPLIES) IMPLANT
GUIDEWIRE BEAD TIP (WIRE) IMPLANT
INSERTER CONNECTOR 3.5 SHORT (CONNECTOR) IMPLANT
KIT BASIN OR (CUSTOM PROCEDURE TRAY) ×2 IMPLANT
KIT TURNOVER KIT B (KITS) ×2 IMPLANT
NAIL FEM RETRO 10.5X380 (Nail) IMPLANT
PACK ORTHO EXTREMITY (CUSTOM PROCEDURE TRAY) ×2 IMPLANT
PACK UNIVERSAL I (CUSTOM PROCEDURE TRAY) ×2 IMPLANT
PAD ARMBOARD 7.5X6 YLW CONV (MISCELLANEOUS) ×4 IMPLANT
SCREW CORT TI DBL LEAD 5X30 (Screw) IMPLANT
SCREW CORT TI DBL LEAD 5X65 (Screw) IMPLANT
SCREW CORT TI DBL LEAD 5X75 (Screw) IMPLANT
SCREW CORT TI DBL LEAD 5X80 (Screw) IMPLANT
SCREW CORT TI DBLE LEAD 5X28 (Screw) IMPLANT
SOL PREP POV-IOD 4OZ 10% (MISCELLANEOUS) IMPLANT
SOL SCRUB PVP POV-IOD 4OZ 7.5% (MISCELLANEOUS) ×1
SOLUTION SCRB POV-IOD 4OZ 7.5% (MISCELLANEOUS) IMPLANT
SPONGE T-LAP 18X18 ~~LOC~~+RFID (SPONGE) ×2 IMPLANT
STAPLER VISISTAT 35W (STAPLE) ×2 IMPLANT
STOCKINETTE IMPERVIOUS LG (DRAPES) ×2 IMPLANT
SUT ETHILON 2 0 PSLX (SUTURE) IMPLANT
SUT PROLENE 3 0 PS 2 (SUTURE) IMPLANT
SUT VIC AB 0 CT1 27 (SUTURE) ×1
SUT VIC AB 0 CT1 27XBRD ANBCTR (SUTURE) IMPLANT
SUT VIC AB 2-0 CT1 27 (SUTURE) ×1
SUT VIC AB 2-0 CT1 TAPERPNT 27 (SUTURE) IMPLANT
SUT VIC AB 2-0 CT3 27 (SUTURE) IMPLANT
TOWEL GREEN STERILE (TOWEL DISPOSABLE) ×4 IMPLANT
TOWEL GREEN STERILE FF (TOWEL DISPOSABLE) ×2 IMPLANT
TUBE CONNECTING 12X1/4 (SUCTIONS) ×2 IMPLANT
YANKAUER SUCT BULB TIP NO VENT (SUCTIONS) ×2 IMPLANT

## 2023-03-27 NOTE — Progress Notes (Signed)
HD#0 Subjective:   Summary: This is a 67 year old male with a past medical history of hypertension and type 2 diabetes who presents to the emergency department after a fall.  Imaging showed left femur fracture.  Patient admitted for further evaluation management.  Overnight Events: No overnight events  Patient evaluated bedside this morning.  Patient states he is very tired.  He states that his pain is well-controlled.  He states he has not had a bowel movement.  He reports some shortness of breath.  All he voices that he would like to sleep.  Objective:  Vital signs in last 24 hours: Vitals:   03/27/23 0207 03/27/23 0523 03/27/23 0804 03/27/23 0840  BP: 109/62 (!) 146/130 (!) 108/58 (!) 112/56  Pulse: (!) 105 99 88   Resp: 17 17 18    Temp: 98.1 F (36.7 C) 98 F (36.7 C) 98.3 F (36.8 C)   TempSrc: Oral  Oral   SpO2: 98% 100% 96%   Weight:      Height:       Supplemental O2: Room Air SpO2: 96 %   Physical Exam:  General: Resting bed, no acute distress HENT: normocephalic atraumatic Cardiovascular: regular rate and rhythm, no m/r/g Pulmonary/Chest: normal work of breathing on room air, lungs clear to auscultation bilaterally Abdominal: soft, non-tender, non-distended Extremity: Left lower remedy with obvious deformity appreciated.  2+ pedal pulses.  Sensation intact.  Filed Weights   03/26/23 1348  Weight: 100.7 kg     Intake/Output Summary (Last 24 hours) at 03/27/2023 1114 Last data filed at 03/27/2023 0804 Gross per 24 hour  Intake --  Output 250 ml  Net -250 ml   Net IO Since Admission: -250 mL [03/27/23 1114]  Pertinent Labs:    Latest Ref Rng & Units 03/26/2023    2:11 PM 09/01/2022   10:18 PM 09/01/2022    4:26 PM  CBC  WBC 4.0 - 10.5 K/uL 13.8   5.9   Hemoglobin 13.0 - 17.0 g/dL 9.2  40.913.6    81.113.3  91.412.3   Hematocrit 39.0 - 52.0 % 29.0  40.0    39.0  36.1   Platelets 150 - 400 K/uL 201   208        Latest Ref Rng & Units 03/26/2023    2:11 PM  09/01/2022   10:18 PM 09/01/2022    7:30 PM  CMP  Glucose 70 - 99 mg/dL 782171  73  51   BUN 8 - 23 mg/dL 51  25  16   Creatinine 0.61 - 1.24 mg/dL 9.561.55  2.130.80  0.860.53   Sodium 135 - 145 mmol/L 139  142    142  146   Potassium 3.5 - 5.1 mmol/L 4.8  4.0    4.0  3.0   Chloride 98 - 111 mmol/L 107  107  130   CO2 22 - 32 mmol/L 17   11   Calcium 8.9 - 10.3 mg/dL 8.6   4.6   Total Protein 6.5 - 8.1 g/dL 6.2   3.0   Total Bilirubin 0.3 - 1.2 mg/dL 1.6   0.6   Alkaline Phos 38 - 126 U/L 85   38   AST 15 - 41 U/L 37   13   ALT 0 - 44 U/L 18   7     Imaging: CT Cervical Spine Wo Contrast  Result Date: 03/26/2023 CLINICAL DATA:  Neck trauma (Age >= 65y).  Fall. EXAM: CT CERVICAL SPINE WITHOUT CONTRAST  TECHNIQUE: Multidetector CT imaging of the cervical spine was performed without intravenous contrast. Multiplanar CT image reconstructions were also generated. RADIATION DOSE REDUCTION: This exam was performed according to the departmental dose-optimization program which includes automated exposure control, adjustment of the mA and/or kV according to patient size and/or use of iterative reconstruction technique. COMPARISON:  MRI 09/01/2022 FINDINGS: Alignment: Normal Skull base and vertebrae: No acute fracture. No primary bone lesion or focal pathologic process. Soft tissues and spinal canal: No prevertebral fluid or swelling. No visible canal hematoma. Disc levels: Prior ACDF at C5-6. Large flowing osteophytes anteriorly throughout the cervical spine. Mild to moderate bilateral degenerative facet disease bilaterally. No disc herniation. Upper chest: Emphysema.  No acute findings. Other: None IMPRESSION: Diffuse degenerative disc and facet disease. No acute bony abnormality. Electronically Signed   By: Charlett NoseKevin  Dover M.D.   On: 03/26/2023 15:29   CT Head Wo Contrast  Result Date: 03/26/2023 CLINICAL DATA:  Head trauma, minor (Age >= 65y).  Fall. EXAM: CT HEAD WITHOUT CONTRAST TECHNIQUE: Contiguous axial images  were obtained from the base of the skull through the vertex without intravenous contrast. RADIATION DOSE REDUCTION: This exam was performed according to the departmental dose-optimization program which includes automated exposure control, adjustment of the mA and/or kV according to patient size and/or use of iterative reconstruction technique. COMPARISON:  06/07/2008 FINDINGS: Brain: No acute intracranial abnormality. Specifically, no hemorrhage, hydrocephalus, mass lesion, acute infarction, or significant intracranial injury. Vascular: No hyperdense vessel or unexpected calcification. Skull: No acute calvarial abnormality. Sinuses/Orbits: Mucosal thickening in the paranasal sinuses. No air-fluid levels. Other: None IMPRESSION: No acute intracranial abnormality. Electronically Signed   By: Charlett NoseKevin  Dover M.D.   On: 03/26/2023 15:27   DG Pelvis 1-2 Views  Result Date: 03/26/2023 CLINICAL DATA:  190176 Fall 190176 EXAM: PELVIS - 1-2 VIEW COMPARISON:  None Available. FINDINGS: Atypical, oblique AP views of the pelvis with significantly limits evaluation of the pelvis and proximal femurs. Prior left acetabular fixation. No evidence of hardware complication. No obvious acute displaced fracture. Degenerative changes of the spine with bony fusion. Metallic IVC filter noted. IMPRESSION: Atypical oblique views significantly limiting evaluation of the pelvis and proximal femurs. No obvious acute displaced fracture, however there is if there is significant clinical concern for pelvic or proximal femur fracture, CT may be warranted if unable to obtain adequate radiographic views. Electronically Signed   By: Caprice RenshawJacob  Kahn M.D.   On: 03/26/2023 15:26   DG Chest 1 View  Result Date: 03/26/2023 CLINICAL DATA:  Fall, left leg deformity EXAM: CHEST  1 VIEW COMPARISON:  08/01/2020 FINDINGS: Left chest partially obscured by the left upper extremity. Overlying soft tissues felt to be related to the left upper extremity. No definite  confluent opacities. Heart is normal size. Mediastinal contours within normal limits. No effusions or acute bony abnormality. IMPRESSION: No definite acute process. Electronically Signed   By: Charlett NoseKevin  Dover M.D.   On: 03/26/2023 15:24   DG Femur Min 2 Views Left  Result Date: 03/26/2023 CLINICAL DATA:  Left femur deformity, swelling, fall EXAM: LEFT FEMUR 2 VIEWS COMPARISON:  None Available. FINDINGS: Comminuted fracture noted through the distal the shaft of the left femur. Marked displacement and angulation. Prior plate and screw fixation within the left acetabulum. Advanced degenerative changes in the left hip. IMPRESSION: Comminuted, displaced and angulated distal left femoral shaft fracture. Electronically Signed   By: Charlett NoseKevin  Dover M.D.   On: 03/26/2023 15:22    Assessment/Plan:   Principal Problem:  Closed femur fracture Active Problems:   AKI (acute kidney injury)   Traumatic rhabdomyolysis   Patient Summary: [name] is a 67 y.o. male with a past medical history of type 2 diabetes and hypertension presents for left lower extremity pain.  Patient found to have left femur fracture, and admitted for further evaluation and management.   #Closed comminuted, displaced and angulated distal left femoral shaft fracture #Leukocytosis, likely reactive Patient evaluated at bedside today.  On exam patient has obvious deformity noted to left lower extremity.  Orthopedics to take patient to surgery today.  Keep patient n.p.o. for now.  Pain seems to be well-controlled.  Will resume diet afterwards.  I am unsure if this was a fall from standing height given patient fell off bed.  I wonder if patient has underlying concern of osteoporosis.  I think likely he did fall from standing height, and clinically has osteoporosis. -Continue pain regimen Tylenol 1000 mg every 8 hours -Continue Dilaudid 0.5 mg every 4 hours -Continue oxycodone 10 mg every 4 hours as needed -Continue with bowel regimen with  MiraLAX and Senokot -Vitamin D level pending  #AKI Patient status post 1 L in the emergency department yesterday. Labs  this morning still pending.  Did start some maintenance fluids.  Likely prerenal. -Maintenance fluids -Follow up BMP    #Hypertension Blood pressure this morning stable.  Most recent blood pressure 112/56.  Most blood pressures have been in the 110s.  Given patient is n.p.o. we will hold his home losartan. -Continue to monitor blood pressure daily -Continue to hold losartan 25 mg daily    #Type 2 diabetes mellitus We will obtain A1c here.  Previous A1c 2 years ago at 6.9. -Repeat A1c pending   #Macrocytic anemia Patient has macrocytic anemia.  MCV 103.6.  Hemoglobin 9.2.  Unclear baseline.  Will evaluate for vitamin B12 deficiency and folate deficiency.  Patient likely had some blood loss from fracture.  I do also anticipate patient will have blood loss from surgery. -Follow-up B12 -Follow-up folate -Monitor CBC  Diet: Normal VTE: Enoxaparin IVF: None,None Code: Full  Dispo: Anticipated discharge to Home in 3 days pending medical improvement and PT/OT evaluation  Modena Slater DO Internal Medicine Resident PGY-1 [phone] Please contact the on call pager after 5 pm and on weekends at [phone].

## 2023-03-27 NOTE — Anesthesia Postprocedure Evaluation (Signed)
Anesthesia Post Note  Patient: [name]  Procedure(s) Performed: INTRAMEDULLARY (IM) RETROGRADE FEMORAL NAILING (Left: Leg Upper)     Patient location during evaluation: PACU Anesthesia Type: General Level of consciousness: awake and alert Pain management: pain level controlled Vital Signs Assessment: post-procedure vital signs reviewed and stable Respiratory status: spontaneous breathing, nonlabored ventilation, respiratory function stable and patient connected to nasal cannula oxygen Cardiovascular status: blood pressure returned to baseline and stable Postop Assessment: no apparent nausea or vomiting Anesthetic complications: no   No notable events documented.  Last Vitals:  Vitals:   03/27/23 1945 03/27/23 2016  BP: (!) 121/56 126/65  Pulse: 81 70  Resp: 20 15  Temp: 36.7 C (!) 36.3 C  SpO2: 98%     Last Pain:  Vitals:   03/27/23 2016  TempSrc: Oral  PainSc:                  Nelle Don Daleigh Pollinger
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
## 2023-03-27 NOTE — Anesthesia Procedure Notes (Signed)
Procedure Name: Intubation Date/Time: 03/27/2023 4:02 PM  Performed by: Stanton Kidney, CRNAPre-anesthesia Checklist: Patient identified, Patient being monitored, Timeout performed, Emergency Drugs available and Suction available Patient Re-evaluated:Patient Re-evaluated prior to induction Oxygen Delivery Method: Circle system utilized Preoxygenation: Pre-oxygenation with 100% oxygen Induction Type: IV induction Ventilation: Mask ventilation without difficulty Laryngoscope Size: McGraph and 4 Grade View: Grade I Tube type: Oral Tube size: 7.0 mm Number of attempts: 1 Airway Equipment and Method: Stylet Placement Confirmation: ETT inserted through vocal cords under direct vision, positive ETCO2 and breath sounds checked- equal and bilateral Secured at: 22 cm Tube secured with: Tape Dental Injury: Teeth and Oropharynx as per pre-operative assessment  Difficulty Due To: Difficult Airway- due to limited oral opening and Difficult Airway- due to reduced neck mobility Comments: Easy mask ventilation poor view with miller 3, GlideGo intubation with ease

## 2023-03-27 NOTE — Discharge Instructions (Signed)
Orthopaedic Trauma Service Discharge Instructions   General Discharge Instructions  Orthopaedic Injuries:  Left femur fracture treated with intramedullary nailing  WEIGHT BEARING STATUS: Nonweightbearing Left leg   RANGE OF MOTION/ACTIVITY: left hip and knee motion as tolerated    Wound Care: daily wound care starting on 03/29/2023.     Discharge Wound Care Instructions  Do NOT apply any ointments, solutions or lotions to pin sites or surgical wounds.  These prevent needed drainage and even though solutions like hydrogen peroxide kill bacteria, they also damage cells lining the pin sites that help fight infection.  Applying lotions or ointments can keep the wounds moist and can cause them to breakdown and open up as well. This can increase the risk for infection. When in doubt call the office.  Surgical incisions should be dressed daily.  If any drainage is noted, use a mepilex dressing, also called a silicone foam dressing    http://rojas.com/https://www.amazon.com/Silicone-Dressing-Border-Adhesive-Waterproof/dp/B07MNV4CQJ  These dressing supplies should be available at local medical supply stores ([facility], [facility], etc). They are not usually carried at places like [facility], [facility], [facility], etc  Once the incision is completely dry and without drainage, it may be left open to air out.  Showering may begin 36-48 hours later.  Cleaning gently with soap and water.  Diet: as you were eating previously.  Can use over the counter stool softeners and bowel preparations, such as Miralax, to help with bowel movements.  Narcotics can be constipating.  Be sure to drink plenty of fluids  PAIN MEDICATION USE AND EXPECTATIONS  You have likely been given narcotic medications to help control your pain.  After a traumatic event that results in an fracture (broken bone) with or without surgery, it is ok to use narcotic pain medications to help control one's pain.  We understand that everyone responds  to pain differently and each individual patient will be evaluated on a regular basis for the continued need for narcotic medications. Ideally, narcotic medication use should last no more than 6-8 weeks (coinciding with fracture healing).   As a patient it is your responsibility as well to monitor narcotic medication use and report the amount and frequency you use these medications when you come to your office visit.   We would also advise that if you are using narcotic medications, you should take a dose prior to therapy to maximize you participation.  IF YOU ARE ON NARCOTIC MEDICATIONS IT IS NOT PERMISSIBLE TO OPERATE A MOTOR VEHICLE (MOTORCYCLE/CAR/TRUCK/MOPED) OR HEAVY MACHINERY DO NOT MIX NARCOTICS WITH OTHER CNS (CENTRAL NERVOUS SYSTEM) DEPRESSANTS SUCH AS ALCOHOL   POST-OPERATIVE OPIOID TAPER INSTRUCTIONS: It is important to wean off of your opioid medication as soon as possible. If you do not need pain medication after your surgery it is ok to stop day one. Opioids include: Codeine, Hydrocodone(Norco, Vicodin), Oxycodone(Percocet, oxycontin) and hydromorphone amongst others.  Long term and even short term use of opiods can cause: Increased pain response Dependence Constipation Depression Respiratory depression And more.  Withdrawal symptoms can include Flu like symptoms Nausea, vomiting And more Techniques to manage these symptoms Hydrate well Eat regular healthy meals Stay active Use relaxation techniques(deep breathing, meditating, yoga) Do Not substitute Alcohol to help with tapering If you have been on opioids for less than two weeks and do not have pain than it is ok to stop all together.  Plan to wean off of opioids This plan should start within one week post op of your fracture surgery  Maintain  the same interval or time between taking each dose and first decrease the dose.  Cut the total daily intake of opioids by one tablet each day Next start to increase the time  between doses. The last dose that should be eliminated is the evening dose.    STOP SMOKING OR USING NICOTINE PRODUCTS!!!!  As discussed nicotine severely impairs your body's ability to heal surgical and traumatic wounds but also impairs bone healing.  Wounds and bone heal by forming microscopic blood vessels (angiogenesis) and nicotine is a vasoconstrictor (essentially, shrinks blood vessels).  Therefore, if vasoconstriction occurs to these microscopic blood vessels they essentially disappear and are unable to deliver necessary nutrients to the healing tissue.  This is one modifiable factor that you can do to dramatically increase your chances of healing your injury.    (This means no smoking, no nicotine gum, patches, etc)  DO NOT USE NONSTEROIDAL ANTI-INFLAMMATORY DRUGS (NSAID'S)  Using products such as Advil (ibuprofen), Aleve (naproxen), Motrin (ibuprofen) for additional pain control during fracture healing can delay and/or prevent the healing response.  If you would like to take over the counter (OTC) medication, Tylenol (acetaminophen) is ok.  However, some narcotic medications that are given for pain control contain acetaminophen as well. Therefore, you should not exceed more than 4000 mg of tylenol in a day if you do not have liver disease.  Also note that there are may OTC medicines, such as cold medicines and allergy medicines that my contain tylenol as well.  If you have any questions about medications and/or interactions please ask your doctor/PA or your pharmacist.      ICE AND ELEVATE INJURED/OPERATIVE EXTREMITY  Using ice and elevating the injured extremity above your heart can help with swelling and pain control.  Icing in a pulsatile fashion, such as 20 minutes on and 20 minutes off, can be followed.    Do not place ice directly on skin. Make sure there is a barrier between to skin and the ice pack.    Using frozen items such as frozen peas works well as the conform nicely to the are  that needs to be iced.  USE AN ACE WRAP OR TED HOSE FOR SWELLING CONTROL  In addition to icing and elevation, Ace wraps or TED hose are used to help limit and resolve swelling.  It is recommended to use Ace wraps or TED hose until you are informed to stop.    When using Ace Wraps start the wrapping distally (farthest away from the body) and wrap proximally (closer to the body)   Example: If you had surgery on your leg or thing and you do not have a splint on, start the ace wrap at the toes and work your way up to the thigh        If you had surgery on your upper extremity and do not have a splint on, start the ace wrap at your fingers and work your way up to the upper arm  IF YOU ARE IN A SPLINT OR CAST DO NOT REMOVE IT FOR ANY REASON   If your splint gets wet for any reason please contact the office immediately. You may shower in your splint or cast as long as you keep it dry.  This can be done by wrapping in a cast cover or garbage back (or similar)  Do Not stick any thing down your splint or cast such as pencils, money, or hangers to try and scratch yourself with.  If  you feel itchy take benadryl as prescribed on the bottle for itching  IF YOU ARE IN A CAM BOOT (BLACK BOOT)  You may remove boot periodically. Perform daily dressing changes as noted below.  Wash the liner of the boot regularly and wear a sock when wearing the boot. It is recommended that you sleep in the boot until told otherwise    Call office for the following: Temperature greater than 101F Persistent nausea and vomiting Severe uncontrolled pain Redness, tenderness, or signs of infection (pain, swelling, redness, odor or green/yellow discharge around the site) Difficulty breathing, headache or visual disturbances Hives Persistent dizziness or light-headedness Extreme fatigue Any other questions or concerns you may have after discharge  In an emergency, call 911 or go to an Emergency Department at a nearby  hospital  HELPFUL INFORMATION  If you had a block, it will wear off between 8-24 hrs postop typically.  This is period when your pain may go from nearly zero to the pain you would have had postop without the block.  This is an abrupt transition but nothing dangerous is happening.  You may take an extra dose of narcotic when this happens.  You should wean off your narcotic medicines as soon as you are able.  Most patients will be off or using minimal narcotics before their first postop appointment.   We suggest you use the pain medication the first night prior to going to bed, in order to ease any pain when the anesthesia wears off. You should avoid taking pain medications on an empty stomach as it will make you nauseous.  Do not drink alcoholic beverages or take illicit drugs when taking pain medications.  In most states it is against the law to drive while you are in a splint or sling.  And certainly against the law to drive while taking narcotics.  You may return to work/school in the next couple of days when you feel up to it.   Pain medication may make you constipated.  Below are a few solutions to try in this order: Decrease the amount of pain medication if you aren't having pain. Drink lots of decaffeinated fluids. Drink prune juice and/or each dried prunes  If the first 3 don't work start with additional solutions Take Colace - an over-the-counter stool softener Take Senokot - an over-the-counter laxative Take Miralax - a stronger over-the-counter laxative     CALL THE OFFICE WITH ANY QUESTIONS OR CONCERNS: [phone]   VISIT OUR WEBSITE FOR ADDITIONAL INFORMATION: [email or web address]

## 2023-03-27 NOTE — Progress Notes (Signed)
PT Cancellation Note  Patient Details Name: MUCAAD SCHIERMAN MRN: [id number] DOB: Dec 12, 1956   Cancelled Treatment:    Reason Eval/Treat Not Completed: Medical issues which prohibited therapy - Per notes from yesterday pt with femur fx with possible surgery by ortho today. Will wait to see patient post surgery.   Marye Round, PT DPT [facility] Rehabilitation Services Pager [phone]  Office [phone]    Truddie Coco 03/27/2023, 8:44 AM
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
## 2023-03-27 NOTE — Progress Notes (Signed)
OT Cancellation Note  Patient Details Name: STYLIANOS HAYES MRN: [id number] DOB: 01/14/1956   Cancelled Treatment:    Reason Eval/Treat Not Completed: Medical issues which prohibited therapy. Per notes from yesterday pt with femur fx with possible surgery by ortho today. Will wait to see patient post surgery.  Lindon Romp OT Acute Rehabilitation Services Office [phone]    Evette Georges 03/27/2023, 7:41 AM

## 2023-03-27 NOTE — Transfer of Care (Signed)
Immediate Anesthesia Transfer of Care Note  Patient: [name]  Procedure(s) Performed: INTRAMEDULLARY (IM) RETROGRADE FEMORAL NAILING (Left: Leg Upper)  Patient Location: PACU  Anesthesia Type:General  Level of Consciousness: awake and alert   Airway & Oxygen Therapy: Patient Spontanous Breathing  Post-op Assessment: Report given to RN and Post -op Vital signs reviewed and stable  Post vital signs: Reviewed and stable  Last Vitals:  Vitals Value Taken Time  BP 134/62 03/27/23 1816  Temp    Pulse 81 03/27/23 1819  Resp 17 03/27/23 1819  SpO2 100 % 03/27/23 1819  Vitals shown include unvalidated device data.  Last Pain:  Vitals:   03/27/23 1357  TempSrc:   PainSc: 10-Worst pain ever         Complications: No notable events documented.

## 2023-03-27 NOTE — Progress Notes (Signed)
Ortho Trauma Service Post Op plan  Retrograde nail of Left distal femur fracture  NWB L leg x 6 weeks No motion restrictions   Very poor bone quality as expected  Recommend lovenox x 21 days. If dc's home can do eliquis 2.5 mg po BID x 21 days  Therapy evals  TOC consult for dc needs  Mearl Latin, PA-C [phone] (C) 03/27/2023, 7:57 PM  [facility] [address] [place] [address] [phone] (F)       Patient ID: [name], male   DOB: 11/25/1956, 67 y.o.   MRN: [id number]

## 2023-03-28 LAB — CBC
HCT: 21.3 % — ABNORMAL LOW (ref 39.0–52.0)
Hemoglobin: 7.2 g/dL — ABNORMAL LOW (ref 13.0–17.0)
MCH: 32.6 pg (ref 26.0–34.0)
MCHC: 33.8 g/dL (ref 30.0–36.0)
MCV: 96.4 fL (ref 80.0–100.0)
Platelets: 227 K/uL (ref 150–400)
RBC: 2.21 MIL/uL — ABNORMAL LOW (ref 4.22–5.81)
RDW: 13.7 % (ref 11.5–15.5)
WBC: 13 K/uL — ABNORMAL HIGH (ref 4.0–10.5)
nRBC: 0 % (ref 0.0–0.2)

## 2023-03-28 LAB — VITAMIN D 25 HYDROXY (VIT D DEFICIENCY, FRACTURES): Vit D, 25-Hydroxy: 33.91 ng/mL (ref 30–100)

## 2023-03-28 LAB — BASIC METABOLIC PANEL WITH GFR
Anion gap: 10 (ref 5–15)
BUN: 46 mg/dL — ABNORMAL HIGH (ref 8–23)
CO2: 22 mmol/L (ref 22–32)
Calcium: 8.2 mg/dL — ABNORMAL LOW (ref 8.9–10.3)
Chloride: 105 mmol/L (ref 98–111)
Creatinine, Ser: 1.26 mg/dL — ABNORMAL HIGH (ref 0.61–1.24)
GFR, Estimated: >60 mL/min
Glucose, Bld: 219 mg/dL — ABNORMAL HIGH (ref 70–99)
Potassium: 4.3 mmol/L (ref 3.5–5.1)
Sodium: 137 mmol/L (ref 135–145)

## 2023-03-28 LAB — GLUCOSE, CAPILLARY
Glucose-Capillary: 134 mg/dL — ABNORMAL HIGH (ref 70–99)
Glucose-Capillary: 176 mg/dL — ABNORMAL HIGH (ref 70–99)
Glucose-Capillary: 103 mg/dL — ABNORMAL HIGH (ref 70–99)
Glucose-Capillary: 79 mg/dL (ref 70–99)

## 2023-03-28 MED ORDER — INSULIN ASPART 100 UNIT/ML IJ SOLN
0.0000 [IU] | Freq: Three times a day (TID) | INTRAMUSCULAR | Status: DC
  Administered 2023-03-28: 2 [IU] via SUBCUTANEOUS

## 2023-03-28 NOTE — Progress Notes (Signed)
Subjective: Patient reports pain as marked.  Tolerating diet.  Urinating.   No CP, SOB.  Hasn't mobilized OOB with PT yet. May have issues due to some degree of paraplegia at baseline.   Objective:   VITALS:   Vitals:   03/27/23 1930 03/27/23 1945 03/27/23 2016 03/28/23 0457  BP: 129/64 (!) 121/56 126/65 (!) 144/70  Pulse: 77 81 70 83  Resp: 13 20 15 17   Temp:  98.1 F (36.7 C) (!) 97.3 F (36.3 C) 98 F (36.7 C)  TempSrc:   Oral   SpO2: 97% 98%  94%  Weight:      Height:          Latest Ref Rng & Units 03/28/2023    3:31 AM 03/27/2023    8:46 PM 03/26/2023    2:11 PM  CBC  WBC 4.0 - 10.5 K/uL 13.0  14.1  13.8   Hemoglobin 13.0 - 17.0 g/dL 7.2  7.6  9.2   Hematocrit 39.0 - 52.0 % 21.3  23.1  29.0   Platelets 150 - 400 K/uL 227  232  201       Latest Ref Rng & Units 03/28/2023    3:31 AM 03/27/2023    8:46 PM 03/26/2023    2:11 PM  BMP  Glucose 70 - 99 mg/dL 008  676  195   BUN 8 - 23 mg/dL 46  45  51   Creatinine 0.61 - 1.24 mg/dL 0.93  2.67  1.24   Sodium 135 - 145 mmol/L 137  139  139   Potassium 3.5 - 5.1 mmol/L 4.3  4.4  4.8   Chloride 98 - 111 mmol/L 105  106  107   CO2 22 - 32 mmol/L 22  23  17    Calcium 8.9 - 10.3 mg/dL 8.2  8.4  8.6    Intake/Output      04/05 0701 04/06 0700 04/06 0701 04/07 0700   I.V. (mL/kg) 1000 (9.9)    IV Piggyback 519.3    Total Intake(mL/kg) 1519.3 (15.1)    Urine (mL/kg/hr) 150 (0.1)    Total Output 150    Net +1369.3            Physical Exam: General: NAD.  Sitting up on edge of bed, calm, says he doesn't feel well, looks kind of dazed Resp: No increased wob Cardio: regular rate and rhythm ABD soft Neurologically intact MSK Neurovascularly intact Sensation intact distally Intact pulses distally Dorsiflexion/Plantar flexion intact Passively let me extend his L knee some Incision: dressings C/D/I L thigh edematous but compressible   Assessment: 1 Day Post-Op  S/P Procedure(s) (LRB): INTRAMEDULLARY (IM)  RETROGRADE FEMORAL NAILING (Left) by Dr. Carola Frost on 03/27/23  Principal Problem:   Closed femur fracture Active Problems:   AKI (acute kidney injury)   Traumatic rhabdomyolysis   Plan: Watch L thigh for any possible compartment syndrome  Advance diet Up with therapy as able Incentive Spirometry Elevate and Apply ice  Weightbearing: NWB RLE x 6 weeks, ROM at hip and knee ok Insicional and dressing care: Dressings left intact until follow-up and Reinforce dressings as needed Orthopedic device(s): None Showering: Keep dressing dry VTE prophylaxis:  Lovenox 50mg  daily , SCDs, ambulation Pain control: continue current regimen Follow - up plan:  with Dr. Carola Frost in the office Contact information for today:  Margarita Rana MD, Levester Fresh PA-C  Dispo:  TBD based on PT evals. Will likely need SNF.      [name]  Leveda Anna, PA-C Office [phone] 03/28/2023, 11:26 AM

## 2023-03-28 NOTE — Evaluation (Signed)
Physical Therapy Evaluation Patient Details Name: [name] MRN: [id number] DOB: 1956-07-06 Today's Date: 03/28/2023  History of Present Illness  [name] is a 67 yo male who presented with L hip pain after a fall OOB. He is now s/p Retrograde nail of Left distal femur fracture 4/5. PMHs: of DM, HTN, HLD, and chronic pain syndrome  Clinical Impression  PTA, pt lives alone and requires assist from his nephew and ADL's. Pt typically performs a stand pivot transfer to and from his manual wheelchair. Pt presents with LLE pain, generalized weakness, and decreased functional mobility secondary to weightbearing precautions. Pt requiring two person mod-maxA + 2 for bed mobility and attempts to transfer. Unable to tolerate standing on RLE or pivoting at this time. May benefit from trialing slide board transfers. Patient will benefit from continued inpatient follow up therapy, <3 hours/day.     Recommendations for follow up therapy are one component of a multi-disciplinary discharge planning process, led by the attending physician.  Recommendations may be updated based on patient status, additional functional criteria and insurance authorization.  Follow Up Recommendations Can patient physically be transported by private vehicle: No     Assistance Recommended at Discharge Frequent or constant Supervision/Assistance  Patient can return home with the following  Two people to help with walking and/or transfers;A lot of help with bathing/dressing/bathroom    Equipment Recommendations Other (comment) (defer)  Recommendations for Other Services       Functional Status Assessment Patient has had a recent decline in their functional status and demonstrates the ability to make significant improvements in function in a reasonable and predictable amount of time.     Precautions / Restrictions Precautions Precautions: Fall Restrictions Weight Bearing Restrictions: Yes LLE Weight Bearing: Non weight  bearing Other Position/Activity Restrictions: x6 weeks      Mobility  Bed Mobility Overal bed mobility: Needs Assistance Bed Mobility: Rolling, Sidelying to Sit, Sit to Supine Rolling: Mod assist Sidelying to sit: Max assist, +2 for physical assistance, +2 for safety/equipment   Sit to supine: Max assist, +2 for physical assistance, +2 for safety/equipment        Transfers Overall transfer level: Needs assistance Equipment used: 2 person hand held assist Transfers: Sit to/from Stand, Bed to chair/wheelchair/BSC Sit to Stand: Max assist, +2 physical assistance, +2 safety/equipment          Lateral/Scoot Transfers: Max assist, +2 physical assistance, +2 safety/equipment General transfer comment: only able to tolerate 1x scoot to the R. attempting standing 1x, unable to get fully upright, good maintance of LLE NWB with PT underneath as cue    Ambulation/Gait                  Stairs            Wheelchair Mobility    Modified Rankin (Stroke Patients Only)       Balance Overall balance assessment: Needs assistance Sitting-balance support: Feet supported Sitting balance-Leahy Scale: Fair     Standing balance support: Bilateral upper extremity supported, During functional activity Standing balance-Leahy Scale: Zero                               Pertinent Vitals/Pain Pain Assessment Pain Assessment: 0-10 Pain Score: 8  Pain Location: LLE Pain Descriptors / Indicators: Discomfort Pain Intervention(s): Limited activity within patient's tolerance, Monitored during session, Premedicated before session    Home Living Family/patient expects to be discharged to::  Private residence Living Arrangements: Alone Available Help at Discharge: Family;Available 24 hours/day;Available PRN/intermittently Type of Home: Mobile home Home Access: Ramped entrance       Home Layout: One level Home Equipment: Agricultural consultant (2 wheels);Shower  seat;Wheelchair - manual;BSC/3in1 Additional Comments: nephew spends several hours per day at pt's house - nephew can stay 24/7 if needed    Prior Function Prior Level of Function : Needs assist             Mobility Comments: 3x falls in the last 3 months. transfers to [facility] with and without assist, able to navigate down the ramp, not up. ADLs Comments: sponge bathes, nephew assisting with LB ADLs and all IADLs     Hand Dominance   Dominant Hand: Right    Extremity/Trunk Assessment   Upper Extremity Assessment Upper Extremity Assessment: Generalized weakness    Lower Extremity Assessment Lower Extremity Assessment: RLE deficits/detail;LLE deficits/detail RLE Deficits / Details: Grossly 2-3/5 strength throughout LLE Deficits / Details: L femur fx s/p IMN. Flicker of quad activation, ankle WFL    Cervical / Trunk Assessment Cervical / Trunk Assessment: Kyphotic  Communication   Communication: No difficulties  Cognition Arousal/Alertness: Awake/alert Behavior During Therapy: Anxious, Flat affect Overall Cognitive Status: Impaired/Different from baseline Area of Impairment: Safety/judgement, Awareness                         Safety/Judgement: Decreased awareness of safety, Decreased awareness of deficits Awareness: Emergent   General Comments: Overall WFL for simple tasks assessed - not formally assessed.  limited insight to deficits and safety        General Comments      Exercises     Assessment/Plan    PT Assessment Patient needs continued PT services  PT Problem List Decreased strength;Decreased balance;Decreased activity tolerance;Decreased mobility;Pain       PT Treatment Interventions DME instruction;Gait training;Functional mobility training;Therapeutic activities;Balance training;Therapeutic exercise;Patient/family education    PT Goals (Current goals can be found in the Care Plan section)  Acute Rehab PT Goals Patient Stated Goal: less  pain PT Goal Formulation: With patient Time For Goal Achievement: 04/11/23 Potential to Achieve Goals: Fair    Frequency Min 3X/week     Co-evaluation PT/OT/SLP Co-Evaluation/Treatment: Yes Reason for Co-Treatment: Complexity of the patient's impairments (multi-system involvement);For patient/therapist safety;To address functional/ADL transfers   OT goals addressed during session: ADL's and self-care       AM-PAC PT "6 Clicks" Mobility  Outcome Measure Help needed turning from your back to your side while in a flat bed without using bedrails?: A Lot Help needed moving from lying on your back to sitting on the side of a flat bed without using bedrails?: A Lot Help needed moving to and from a bed to a chair (including a wheelchair)?: Total Help needed standing up from a chair using your arms (e.g., wheelchair or bedside chair)?: Total Help needed to walk in hospital room?: Total Help needed climbing 3-5 steps with a railing? : Total 6 Click Score: 8    End of Session Equipment Utilized During Treatment: Gait belt Activity Tolerance: Patient limited by pain Patient left: in bed;with call bell/phone within reach;with bed alarm set;with family/visitor present Nurse Communication: Mobility status PT Visit Diagnosis: Pain;Other abnormalities of gait and mobility (R26.89) Pain - Right/Left: Left Pain - part of body: Leg    Time: 3491-7915 PT Time Calculation (min) (ACUTE ONLY): 30 min   Charges:   PT Evaluation $PT Eval  Moderate Complexity: 1 Mod          Lillia Pauls, [place], DPT Acute Rehabilitation Services Office [phone]   Norval Morton 03/28/2023, 1:09 PM

## 2023-03-28 NOTE — Evaluation (Signed)
Occupational Therapy Evaluation Patient Details Name: [name] MRN: [id number] DOB: 03-Jan-1956 Today's Date: 03/28/2023   History of Present Illness [name] is a 67 yo male who presented with L hip pain after a fall OOB. He is now s/p Retrograde nail of Left distal femur fracture 4/5. PMHs: of DM, HTN, HLD, and chronic pain syndrome   Clinical Impression   [name] was evaluated s/p the above admission list. He requires assist from his nephew for mobility and ADLs at baseline. Upon evaluation he was limited by LLE pain, generalized weakness, decreased insight and awareness, and decreased. Overall he requires mod A +2 for rolling, max A +2 to get to EOB and max A +2 for scooting/standing attempts. Due to the deficits listed below he also needs up to min A for UB ADLs at bed level and max/total A +2 for LB ADLs. Pt will benefit from continued acute OT services. Pt will benefit from skilled inpatient follow up therapy, <3 hours/day.        Recommendations for follow up therapy are one component of a multi-disciplinary discharge planning process, led by the attending physician.  Recommendations may be updated based on patient status, additional functional criteria and insurance authorization.   Assistance Recommended at Discharge Frequent or constant Supervision/Assistance  Patient can return home with the following A lot of help with walking and/or transfers;Two people to help with walking and/or transfers;A lot of help with bathing/dressing/bathroom;Two people to help with bathing/dressing/bathroom;Assistance with cooking/housework;Assistance with feeding;Direct supervision/assist for medications management;Direct supervision/assist for financial management;Assist for transportation;Help with stairs or ramp for entrance    Functional Status Assessment  Patient has had a recent decline in their functional status and demonstrates the ability to make significant improvements in function in a  reasonable and predictable amount of time.  Equipment Recommendations  Other (comment) (defer)    Recommendations for Other Services       Precautions / Restrictions Precautions Precautions: Fall Restrictions Weight Bearing Restrictions: Yes LLE Weight Bearing: Non weight bearing Other Position/Activity Restrictions: x6 weeks      Mobility Bed Mobility Overal bed mobility: Needs Assistance Bed Mobility: Rolling, Sidelying to Sit, Sit to Supine Rolling: Mod assist Sidelying to sit: Max assist, +2 for physical assistance, +2 for safety/equipment   Sit to supine: Max assist, +2 for physical assistance, +2 for safety/equipment        Transfers Overall transfer level: Needs assistance Equipment used: 2 person hand held assist Transfers: Sit to/from Stand, Bed to chair/wheelchair/BSC Sit to Stand: Max assist, +2 physical assistance, +2 safety/equipment          Lateral/Scoot Transfers: Max assist, +2 physical assistance, +2 safety/equipment General transfer comment: only able to tolerate 1x scoot to the R. attempting standing 1x, unable to get fully upright, good maintance of LLE NWB      Balance Overall balance assessment: Needs assistance Sitting-balance support: Feet supported Sitting balance-Leahy Scale: Fair     Standing balance support: Bilateral upper extremity supported, During functional activity Standing balance-Leahy Scale: Zero                             ADL either performed or assessed with clinical judgement   ADL Overall ADL's : Needs assistance/impaired Eating/Feeding: Set up;Sitting Eating/Feeding Details (indicate cue type and reason): not managing well - residual food left in mouth upon arrival Grooming: Set up;Sitting   Upper Body Bathing: Minimal assistance;Sitting   Lower Body Bathing: Maximal  assistance;+2 for physical assistance;+2 for safety/equipment;Bed level   Upper Body Dressing : Minimal assistance;Sitting   Lower  Body Dressing: Maximal assistance;+2 for physical assistance;+2 for safety/equipment;Bed level   Toilet Transfer: Maximal assistance;+2 for physical assistance;+2 for safety/equipment Toilet Transfer Details (indicate cue type and reason): bed level - bed pan at the end of the session Toileting- Clothing Manipulation and Hygiene: Total assistance;+2 for physical assistance;+2 for safety/equipment;Bed level       Functional mobility during ADLs: Maximal assistance;+2 for safety/equipment;+2 for physical assistance;Moderate assistance General ADL Comments: mod A to roll, max A +2 to get to EOB. pain and weakness limited     Vision Baseline Vision/History: 0 No visual deficits Vision Assessment?: No apparent visual deficits Additional Comments: not formally assessed     Perception Perception Perception Tested?: No   Praxis Praxis Praxis tested?: Not tested    Pertinent Vitals/Pain Pain Assessment Pain Assessment: 0-10 Pain Score: 8  Pain Location: LLE Pain Descriptors / Indicators: Discomfort     Hand Dominance Right   Extremity/Trunk Assessment Upper Extremity Assessment Upper Extremity Assessment: Generalized weakness   Lower Extremity Assessment Lower Extremity Assessment: Defer to PT evaluation   Cervical / Trunk Assessment Cervical / Trunk Assessment: Kyphotic   Communication Communication Communication: No difficulties   Cognition Arousal/Alertness: Awake/alert Behavior During Therapy: Anxious, Flat affect Overall Cognitive Status: Impaired/Different from baseline Area of Impairment: Safety/judgement, Awareness                         Safety/Judgement: Decreased awareness of safety, Decreased awareness of deficits Awareness: Emergent   General Comments: Overall WFL for simple tasks assessed - not formally assessed.  limited insight to deficits and safety     General Comments  VSS on RA - pt reported dizziness upon sitting    Exercises      Shoulder Instructions      Home Living Family/patient expects to be discharged to:: Private residence Living Arrangements: Alone Available Help at Discharge: Family;Available 24 hours/day;Available PRN/intermittently Type of Home: Mobile home Home Access: Ramped entrance     Home Layout: One level     Bathroom Shower/Tub: Tub/shower unit;Walk-in shower;Sponge bathes at baseline   Bathroom Toilet: Handicapped height     Home Equipment: Agricultural consultantolling Walker (2 wheels);Shower seat;Wheelchair - manual;BSC/3in1   Additional Comments: nephew spends several hours per day at pt's house - nephew can stay 24/7 if needed      Prior Functioning/Environment Prior Level of Function : Needs assist             Mobility Comments: 3x falls in the last 3 months. [facility] with and without assist, able to navigate down the ramp, not up. ADLs Comments: sponge bathes, nephew assisting with LB ADLs and all IADLs        OT Problem List: Decreased strength;Decreased range of motion;Impaired balance (sitting and/or standing);Decreased activity tolerance;Decreased safety awareness;Decreased knowledge of use of DME or AE;Decreased knowledge of precautions;Pain      OT Treatment/Interventions: Self-care/ADL training;Therapeutic exercise;DME and/or AE instruction;Therapeutic activities;Patient/family education;Balance training    OT Goals(Current goals can be found in the care plan section) Acute Rehab OT Goals Patient Stated Goal: less pain OT Goal Formulation: With patient Time For Goal Achievement: 04/11/23 Potential to Achieve Goals: Good ADL Goals Pt Will Perform Lower Body Bathing: with mod assist;sitting/lateral leans Pt Will Perform Lower Body Dressing: with mod assist;sitting/lateral leans Pt Will Transfer to Toilet: with mod assist;stand pivot transfer;bedside commode Additional  ADL Goal #1: Pt will complete bed mobility with supervision A as a precursor to ADLs  OT Frequency: Min  2X/week    Co-evaluation PT/OT/SLP Co-Evaluation/Treatment: Yes Reason for Co-Treatment: Complexity of the patient's impairments (multi-system involvement);For patient/therapist safety;To address functional/ADL transfers   OT goals addressed during session: ADL's and self-care      AM-PAC OT "6 Clicks" Daily Activity     Outcome Measure Help from another person eating meals?: A Little Help from another person taking care of personal grooming?: A Little Help from another person toileting, which includes using toliet, bedpan, or urinal?: A Lot Help from another person bathing (including washing, rinsing, drying)?: A Lot Help from another person to put on and taking off regular upper body clothing?: A Little Help from another person to put on and taking off regular lower body clothing?: A Lot 6 Click Score: 15   End of Session Equipment Utilized During Treatment: Gait belt Nurse Communication: Mobility status (pt having BM on bed pan)  Activity Tolerance: Patient limited by pain Patient left: in bed;with call bell/phone within reach;with bed alarm set;with family/visitor present  OT Visit Diagnosis: Unsteadiness on feet (R26.81);Other abnormalities of gait and mobility (R26.89);Muscle weakness (generalized) (M62.81);History of falling (Z91.81);Pain                Time: [id number] OT Time Calculation (min): 27 min Charges:  OT General Charges $OT Visit: 1 Visit OT Evaluation $OT Eval Moderate Complexity: 1 Mod  Derenda MisSamantha Causey, OTR/L Acute Rehabilitation Services Office [phone] Secure Chat Communication Preferred   Donia PoundsSamantha D Causey 03/28/2023, 10:36 AM

## 2023-03-28 NOTE — Progress Notes (Signed)
NAME:  [name], MRN:  [id number], DOB:  31-Aug-1956, LOS: 1 ADMISSION DATE:  03/26/2023  Subjective  Patient evaluated at bedside this AM. Still having some leg pain this morning. Ate little this morning, having minimal appetite.   Objective   Blood pressure (!) 144/70, pulse 83, temperature 98 F (36.7 C), resp. rate 17, height 5\' 10"  (1.778 m), weight 100.7 kg, SpO2 94 %.     Intake/Output Summary (Last 24 hours) at 03/28/2023 1206 Last data filed at 03/28/2023 0609 Gross per 24 hour  Intake 1519.3 ml  Output --  Net 1519.3 ml   Filed Weights   03/26/23 1348 03/27/23 1154  Weight: 100.7 kg 100.7 kg   Physical Exam: General: Resting comfortably in no acute distress CV: Regular rate, rhythm. No murmurs.  Pulm: Normal work of breathing on room air. Clear to auscultation bilaterally.  Abdomen: Soft, non-tender, non-distended. Normoactive bowel sounds.  MSK: L leg with multiple bandages, no hematoma or bleeding noted.  Neuro: Awake, alert, conversing appropriately. Grossly non-focal.   Labs       Latest Ref Rng & Units 03/28/2023    3:31 AM 03/27/2023    8:46 PM 03/26/2023    2:11 PM  CBC  WBC 4.0 - 10.5 K/uL 13.0  14.1  13.8   Hemoglobin 13.0 - 17.0 g/dL 7.2  7.6  9.2   Hematocrit 39.0 - 52.0 % 21.3  23.1  29.0   Platelets 150 - 400 K/uL 227  232  201       Latest Ref Rng & Units 03/28/2023    3:31 AM 03/27/2023    8:46 PM 03/26/2023    2:11 PM  BMP  Glucose 70 - 99 mg/dL 919  166  060   BUN 8 - 23 mg/dL 46  45  51   Creatinine 0.61 - 1.24 mg/dL 0.45  9.97  7.41   Sodium 135 - 145 mmol/L 137  139  139   Potassium 3.5 - 5.1 mmol/L 4.3  4.4  4.8   Chloride 98 - 111 mmol/L 105  106  107   CO2 22 - 32 mmol/L 22  23  17    Calcium 8.9 - 10.3 mg/dL 8.2  8.4  8.6     Summary   [name] is 66yo person living with type II diabetes mellitus, hypertension, wheelchair-bound admitted 4/4 for closed left femur fracture now POD1 IMN femoral nailing.   Assessment & Plan:   Principal Problem:   Closed femur fracture Active Problems:   AKI (acute kidney injury)   Traumatic rhabdomyolysis  #Closed left femur fracture POD1 s/p IMN retrograde femoral nail This morning patient continues to have some pain after procedure. Per orthopedic, patient will need to be non-weight bearing (although patient does not stand at baseline and is wheel-chair bound). Likely will need inpatient rehabilitation (SNF v CIR) prior to returning home by himself. Will need 3 weeks of DVT prophylaxis.  - Tylenol 1000mg  every 8 hours - Oxycodone 5mg  every 4 hours as needed - Hydromorphone 0.5mg  every 4 hours as needed - Bowel regimen in place - Lovenox/Eliquis x3 weeks - NWB x 6 weeks - PT/OT - TOC consult for placement   #Acute kidney injury Slightly improved from admission, likely still elevated after some blood loss with surgery and being NPO. Patient was thirsty this morning and wanting water, we will allow for PO challenge today prior to giving further fluids. Holding home ARB. - Hold home losartan - Encourage PO  intake - RFP tomorrow AM - Strict I/O  #Type II diabetes mellitus  Overnight had elevated glucose >200. Do not feel like he needs long-acting insulin at this point. Probably could use metformin as outpatient.  - SSI  #Acute blood loss anemia 2/2 surgery. Hgb 7.2 this morning, stable from yesterday. Folate and B12 normal. Will monitor CBC. - Daily CBC  Best practice:  DIET: Regular IVF: n/a DVT PPX: lovenox BOWEL: Senokot, Miralax CODE: FULL FAM COM: n/a  Walter Kanner, MD Internal Medicine Resident PGY-3 PAGER: [phone] 03/28/2023 12:06 PM  If after hours (below), please contact on-call pager: [phone] 5PM-7AM Monday-Friday 1PM-7AM Saturday-Sunday

## 2023-03-28 NOTE — TOC CAGE-AID Note (Signed)
Transition of Care [facility]) - CAGE-AID Screening  Patient Details  Name: [name] MRN: [id number] Date of Birth: March 24, 1956  Clinical Narrative:  Patient to ED after a fall causing a femur fx. Patient denies any alcohol or drug use, no need for resources at this time.  CAGE-AID Screening:   Have You Ever Felt You Ought to Cut Down on Your Drinking or Drug Use?: No Have People Annoyed You By Critizing Your Drinking Or Drug Use?: No Have You Felt Bad Or Guilty About Your Drinking Or Drug Use?: No Have You Ever Had a Drink or Used Drugs First Thing In The Morning to Steady Your Nerves or to Get Rid of a Hangover?: No CAGE-AID Score: 0  Substance Abuse Education Offered: No

## 2023-03-28 NOTE — Care Plan (Addendum)
Patient [name], reports increased pain in LLE and was medicated with PRN pain meds. He had multiple episodes of incontinence during the day and bath/peri care was provided. Patient reported that he feels that he can't push the stool out and I helped by successfully des impacting patient with the finger.  No SOB, chest pain or N/V reported. LLE skin color WNL, no N/T reported. No falls or injuries during the shift. Care plan was discussed with patient, patient's nephew visited and was updated, rounds provided per hospital policy by RN.

## 2023-03-29 DIAGNOSIS — S728X2A Other fracture of left femur, initial encounter for closed fracture: Secondary | ICD-10-CM | POA: Diagnosis not present

## 2023-03-29 LAB — GLUCOSE, CAPILLARY
Glucose-Capillary: 87 mg/dL (ref 70–99)
Glucose-Capillary: 90 mg/dL (ref 70–99)
Glucose-Capillary: 98 mg/dL (ref 70–99)
Glucose-Capillary: 102 mg/dL — ABNORMAL HIGH (ref 70–99)
Glucose-Capillary: 84 mg/dL (ref 70–99)

## 2023-03-29 LAB — CBC
HCT: 22.4 % — ABNORMAL LOW (ref 39.0–52.0)
Hemoglobin: 7.1 g/dL — ABNORMAL LOW (ref 13.0–17.0)
MCH: 32.3 pg (ref 26.0–34.0)
MCHC: 31.7 g/dL (ref 30.0–36.0)
MCV: 101.8 fL — ABNORMAL HIGH (ref 80.0–100.0)
Platelets: 308 10*3/uL (ref 150–400)
RBC: 2.2 MIL/uL — ABNORMAL LOW (ref 4.22–5.81)
RDW: 14.4 % (ref 11.5–15.5)
WBC: 12.6 10*3/uL — ABNORMAL HIGH (ref 4.0–10.5)
nRBC: 0 % (ref 0.0–0.2)

## 2023-03-29 LAB — RENAL FUNCTION PANEL
Albumin: 2.8 g/dL — ABNORMAL LOW (ref 3.5–5.0)
Anion gap: 12 (ref 5–15)
BUN: 34 mg/dL — ABNORMAL HIGH (ref 8–23)
CO2: 21 mmol/L — ABNORMAL LOW (ref 22–32)
Calcium: 8.4 mg/dL — ABNORMAL LOW (ref 8.9–10.3)
Chloride: 106 mmol/L (ref 98–111)
Creatinine, Ser: 1.01 mg/dL (ref 0.61–1.24)
GFR, Estimated: >60 mL/min (ref >60)
Glucose, Bld: 101 mg/dL — ABNORMAL HIGH (ref 70–99)
Phosphorus: 3.1 mg/dL (ref 2.5–4.6)
Potassium: 4.3 mmol/L (ref 3.5–5.1)
Sodium: 139 mmol/L (ref 135–145)

## 2023-03-29 MED ORDER — APIXABAN 2.5 MG PO TABS
2.5000 mg | ORAL_TABLET | Freq: Two times a day (BID) | ORAL | Status: DC
  Administered 2023-03-29 – 2023-04-01 (×7): 2.5 mg via ORAL
  Filled 2023-03-29 (×7): qty 1

## 2023-03-29 MED ORDER — SALINE SPRAY 0.65 % NA SOLN: 1.0000 | NASAL | Status: DC | PRN

## 2023-03-29 MED ORDER — HYDROXYZINE HCL 10 MG PO TABS
10.0000 mg | ORAL_TABLET | Freq: Once | ORAL | Status: AC | PRN
  Administered 2023-03-29: 10 mg via ORAL
  Filled 2023-03-29: qty 1

## 2023-03-29 NOTE — Discharge Summary (Addendum)
Name: [name] MRN: [id number] DOB: 1956-07-16 67 y.o. PCP: Patient, No Pcp Per  Date of Admission: 03/26/2023  1:33 PM Date of Discharge: No discharge date for patient encounter. Attending Physician: Mercie Eon, MD  Subjective: ***  Discharge Diagnosis: 1. Principal Problem:   Closed femur fracture Active Problems:   AKI (acute kidney injury)  Discharge Medications: Allergies as of 03/29/2023       Reactions   Lipitor [atorvastatin Calcium] Other (See Comments)   Muscle pain     Med Rec must be completed prior to using this [facility]***      Disposition and follow-up:   [name] was discharged from [facility] in Stable condition.  At the hospital follow up visit please address:  1.  Closed femur fracture: Please ensure patient is on DVT prophylaxis (Eliquis 2.5mg  twice daily). He should be taking this through 04/17/23. Patient also to be non-weight bearing through 05/08/23. Please make sure he has follow-up with orthopedics.   2.   Hypertension: Losartan held due to AKI, normotension. Please re-assess to see if still needed.   3.   Acute blood loss anemia: Please re-check CBC to ensure Hgb has improved.   4.  Labs / imaging needed at time of follow-up: RFP, CBC  5.  Pending labs/ test needing follow-up: n/a  Follow-up Appointments:  Follow-up Information     Myrene Galas, MD. Schedule an appointment as soon as possible for a visit in 2 week(s).   Specialty: Orthopedic Surgery Contact information: [address] [place] [address] [phone]                Hospital Course by problem list: 1. Closed left femur fracture Patient presented to [facility] on 03/26/23 after falling in his home on 3/31. During that time he slipped out of bed and had his leg trapped under the bed. At that time he did not want to come to the hospital. However, over the next few days his pain increased and he decided he needed to come to the  hospital. Upon arrival, he was found to have closed comminuted, displaced left femur fracture. Orthopedics were consulted in the ED and pain/bowel regimens were initiated. Patient underwent intramedullary retrograde nailing of left femur on 4/5 with Dr. Carola Frost. Patient is to remain non-weight bearing for 6 weeks (through 05/08/23) and complete three weeks of DVT prophylaxis (last date 04/17/23) with Eliquis. PT/OT recommend...  2. Acute kidney injury Mild AKI on admission likely pre-renal in setting of decreased PO intake. Given a few liters of IVF while inpatient, holding home losartan. On discharge, continue holding losartan.   Discharge Exam:   BP 128/61 (BP Location: Left Arm)   Pulse 83   Temp 98.3 F (36.8 C) (Oral)   Resp 18   Ht 5\' 10"  (1.778 m)   Wt 100.7 kg   SpO2 100%   BMI 31.85 kg/m  Discharge exam: ***  Pertinent Labs, Studies, and Procedures:     Latest Ref Rng & Units 03/28/2023    3:31 AM 03/27/2023    8:46 PM 03/26/2023    2:11 PM  CBC  WBC 4.0 - 10.5 K/uL 13.0  14.1  13.8   Hemoglobin 13.0 - 17.0 g/dL 7.2  7.6  9.2   Hematocrit 39.0 - 52.0 % 21.3  23.1  29.0   Platelets 150 - 400 K/uL 227  232  201       Latest Ref Rng & Units 03/28/2023  3:31 AM 03/27/2023    8:46 PM 03/26/2023    2:11 PM  BMP  Glucose 70 - 99 mg/dL 370  964  383   BUN 8 - 23 mg/dL 46  45  51   Creatinine 0.61 - 1.24 mg/dL 8.18  4.03  7.54   Sodium 135 - 145 mmol/L 137  139  139   Potassium 3.5 - 5.1 mmol/L 4.3  4.4  4.8   Chloride 98 - 111 mmol/L 105  106  107   CO2 22 - 32 mmol/L 22  23  17    Calcium 8.9 - 10.3 mg/dL 8.2  8.4  8.6    DG L FEMUR 03/26/2023 Comminuted, displaced and angulated distal left femoral shaft fracture.  CT HEAD WO CONTRAST 03/26/2023 No acute intracranial abnormality.   CT CERVICAL SPINE WO CONTRAST 03/26/2023 Diffuse degenerative disc and facet disease. No acute bony abnormality.  Discharge Instructions:   Signed: Evlyn Kanner, MD 03/29/2023, 11:28 AM   Pager:  @[phone]

## 2023-03-29 NOTE — Progress Notes (Signed)
NAME:  [name], MRN:  [id number], DOB:  Apr 26, 1956, LOS: 2 ADMISSION DATE:  03/26/2023  Subjective  Patient evaluated at bedside this AM. This morning states he wants to be left alone. Does not want to go to SNF. Still having left hip pain. Denies any dyspnea, chest pain, n/v.   Objective   Blood pressure 128/61, pulse 83, temperature 98.3 F (36.8 C), temperature source Oral, resp. rate 18, height 5\' 10"  (1.778 m), weight 100.7 kg, SpO2 100 %.     Intake/Output Summary (Last 24 hours) at 03/29/2023 1003 Last data filed at 03/28/2023 1255 Gross per 24 hour  Intake --  Output 1 ml  Net -1 ml   Filed Weights   03/26/23 1348 03/27/23 1154  Weight: 100.7 kg 100.7 kg   Physical Exam: General: Resting comfortably in no acute distress CV: Regular rate, rhythm. No murmurs appreciated. Warm extremities.  Pulm: Normal work of breathing on room air. Clear to auscultation bilaterally.   Abdomen: Soft, non-tender, non-distended. Normoactive bowel sounds.  MSK: No peripheral edema noted. Multiple bandages on left thigh, no hematoma appreciated. No pain on palpation of left hip. Unable to lift left leg off of bed.  Neuro: Awake, alert, conversing appropriately. Sensation in tact bilateral lower extremities.   Labs       Latest Ref Rng & Units 03/28/2023    3:31 AM 03/27/2023    8:46 PM 03/26/2023    2:11 PM  CBC  WBC 4.0 - 10.5 K/uL 13.0  14.1  13.8   Hemoglobin 13.0 - 17.0 g/dL 7.2  7.6  9.2   Hematocrit 39.0 - 52.0 % 21.3  23.1  29.0   Platelets 150 - 400 K/uL 227  232  201       Latest Ref Rng & Units 03/28/2023    3:31 AM 03/27/2023    8:46 PM 03/26/2023    2:11 PM  BMP  Glucose 70 - 99 mg/dL 097  353  299   BUN 8 - 23 mg/dL 46  45  51   Creatinine 0.61 - 1.24 mg/dL 2.42  6.83  4.19   Sodium 135 - 145 mmol/L 137  139  139   Potassium 3.5 - 5.1 mmol/L 4.3  4.4  4.8   Chloride 98 - 111 mmol/L 105  106  107   CO2 22 - 32 mmol/L 22  23  17    Calcium 8.9 - 10.3 mg/dL 8.2  8.4  8.6      Summary   [name] is 67yo person living with type II diabetes mellitus, hypertension, wheelchair-bound admitted 4/4 for closed left femur fracture now POD2 s/p IMN femoral nailing pending rehabilitation.   Assessment & Plan:  Principal Problem:   Closed femur fracture Active Problems:   AKI (acute kidney injury)   Traumatic rhabdomyolysis  #Closed left femur fracture POD2 s/p IMN retrograde femoral nail Patient not wanting to work with physical therapy much, reporting he wants to be left alone. Today we discussed importance of him regaining strength prior to him going back home. At this juncture I do not believe he would be safe to discharge back at home as he lives alone and has minimal support to help him. We will continue having conversations regarding placement with him.  - Tylenol 1000mg  every 8 hours - Oxycodone 10mg  every 4 hours as needed - Bowel regimen in place - Eliquis 2.5mg  twice daily x3wks for DVT prophylaxis (last day 04/17/23) - Non-weight bearing for six  weeks - PT/OT - TOC consult for placement  #Acute kidney injury Pending labs for the morning, reports he overall doesn't feel well and isn't hungry. Suspect he still has slight AKI. If creatinine still up will plan to give bolus of fluids, continue to hold home ARB. - Hold home losartan - Encourage PO intake - RFP tomorrow AM - Strict I/O  #Type II diabetes mellitus Sugars at goal, continue with sliding scale.  #Acute blood loss anemia Pending CBC this AM. - Daily CBC, transfuse Hgb <7  Best practice:  DIET: Regular IVF: n/a DVT PPX: eliquis BOWEL: Senokot, Miralax CODE: FULL FAM COM: n/a  Evlyn Kanner, MD Internal Medicine Resident PGY-3 PAGER: [phone] 03/29/2023 10:03 AM  If after hours (below), please contact on-call pager: [phone] 5PM-7AM Monday-Friday 1PM-7AM Saturday-Sunday

## 2023-03-29 NOTE — TOC Initial Note (Signed)
Transition of Care [facility]) - Initial/Assessment Note   Patient Details  Name: [name] MRN: [id number] Date of Birth: 08/20/1956  Transition of Care [facility]) CM/SW Contact:    Helene Kelp, LCSW Phone Number: 03/29/2023, 3:32 PM  Clinical Narrative:                 CSW met with the patient at the bedside and spoke with the patient regarding his disposition. This Clinical research associate reviewed SNF referral process, based on clinical team disposition. The patient noted he was "okay" with the disposition and supported the referral process. [facility] reviewed and provided SNF preference with the patient, which he pt) expressed no concerns with the SNF facilities options to refer to.   This Clinical research associate contacted the patient's nephew Tyrone Nine [phone]. Clifton Custard noted he was okay with the SNF disposition plans, and noted the patient expressed he did not want to be referred to [facility] [address], [place] , [place] due to a fall at the facility historically which caused an injury to the patient's HIP.   Clifton Custard requested if a sitter can be mae possible for the patient, due to limited physical capacity to care for himself, which impacts his hygiene and ability to use the bathroom.   This Clinical research associate to complete SNF referrals for patient disposition support. .   Expected Discharge Plan: Skilled Nursing Facility Barriers to Discharge: Other (must enter comment) (PASSR completion pending the verified social security number.)  Patient Goals and CMS Choice   CMS Medicare.gov Compare Post Acute Care list provided to:: Patient Represenative (must comment) (vSIGMON,WILL  Grandson) Choice offered to / list presented to : Patient  Expected Discharge Plan and Services In-house Referral: Clinical Social Work   Post Acute Care Choice: Skilled Nursing Facility Living arrangements for the past 2 months: Apartment  Prior Living Arrangements/Services Living arrangements for the past 2 months:  Apartment Lives with:: Self   Do you feel safe going back to the place where you live?: Yes      Need for Family Participation in Patient Care: Yes (Comment) Care giver support system in place?: Yes (comment)   Activities of Daily Living Home Assistive Devices/Equipment: None ADL Screening (condition at time of admission) Patient's cognitive ability adequate to safely complete daily activities?: Yes Is the patient deaf or have difficulty hearing?: No Does the patient have difficulty seeing, even when wearing glasses/contacts?: No Does the patient have difficulty concentrating, remembering, or making decisions?: No Patient able to express need for assistance with ADLs?: No Does the patient have difficulty dressing or bathing?: No Independently performs ADLs?: Yes (appropriate for developmental age) Does the patient have difficulty walking or climbing stairs?: Yes Weakness of Legs: None Weakness of Arms/Hands: None  Permission Sought/Granted Permission sought to share information with : Facility Medical sales representative, Sports coach, Family Supports Permission granted to share information with : Yes, Designer, fashion/clothing    Emotional Assessment  Orientation: : Oriented to Place, Oriented to  Time, Oriented to Situation, Oriented to Self Alcohol / Substance Use: Not Applicable Psych Involvement: No (comment)  Admission diagnosis:  Closed femur fracture [S72.90XA] AKI (acute kidney injury) [N17.9] Traumatic rhabdomyolysis, initial encounter [T79.6XXA] Closed fracture of left femur, unspecified fracture morphology, unspecified portion of femur, initial encounter [S72.92XA] Patient Active Problem List   Diagnosis Date Noted   AKI (acute kidney injury) 03/27/2023   Traumatic rhabdomyolysis 03/27/2023   Closed femur fracture 03/26/2023   Malnutrition of moderate degree 09/11/2020   Loss  of weight 09/07/2020   Candidal intertrigo 09/07/2020   Fall at home 09/06/2020    Hypoglycemia 07/31/2020   Purulent Cellulitis of left hip 07/15/2020   Recurrent falls 07/15/2020   Frailty syndrome in geriatric patient 07/15/2020   Physical deconditioning 07/15/2020   Asymptomatic bacteriuria 07/15/2020   Diabetes mellitus 01/28/2012   PCP:  Patient, No Pcp Per Pharmacy:   [id number], [place] - [address] [place] [address] Phone: [phone] Fax: [phone]  Social Determinants of Health (SDOH) Social History: SDOH Screenings   Food Insecurity: No Food Insecurity (03/26/2023)  Housing: Low Risk  (03/27/2023)  Transportation Needs: No Transportation Needs (03/26/2023)  Utilities: Not At Risk (03/26/2023)  Tobacco Use: High Risk (03/27/2023)   SDOH Interventions:   Readmission Risk Interventions

## 2023-03-29 NOTE — NC FL2 (Signed)
[place] MEDICAID FL2 LEVEL OF CARE FORM     IDENTIFICATION  Patient Name: [name] Birthdate: [birth year] Sex: male Admission Date (Current Location): 03/26/2023  [facility] and [place] Number:  Producer, television/film/video and Address:  [facility] [place], [address] [place], [place] [address]      Provider Number:    Attending Physician Name and Address:  Mercie Eon, MD  Relative Name and Phone Number:       Current Level of Care: Hospital Recommended Level of Care: Skilled Nursing Facility Prior Approval Number:    Date Approved/Denied:   PASRR Number: [id number]  Discharge Plan: SNF    Current Diagnoses: Patient Active Problem List   Diagnosis Date Noted   AKI (acute kidney injury) 03/27/2023   Traumatic rhabdomyolysis 03/27/2023   Closed femur fracture 03/26/2023   Malnutrition of moderate degree 09/11/2020   Loss of weight 09/07/2020   Candidal intertrigo 09/07/2020   Fall at home 09/06/2020   Hypoglycemia 07/31/2020   Purulent Cellulitis of left hip 07/15/2020   Recurrent falls 07/15/2020   Frailty syndrome in geriatric patient 07/15/2020   Physical deconditioning 07/15/2020   Asymptomatic bacteriuria 07/15/2020   Diabetes mellitus 01/28/2012    Orientation RESPIRATION BLADDER Height & Weight     Self, Situation, Place, Time  Normal Continent Weight: 222 lb 0.1 oz (100.7 kg) Height:  5\' 10"  (177.8 cm)  BEHAVIORAL SYMPTOMS/MOOD NEUROLOGICAL BOWEL NUTRITION STATUS      Continent Diet  AMBULATORY STATUS COMMUNICATION OF NEEDS Skin   Extensive Assist Verbally Normal                       Personal Care Assistance Level of Assistance  Dressing, Bathing Bathing Assistance: Maximum assistance   Dressing Assistance: Maximum assistance     Functional Limitations Info             SPECIAL CARE FACTORS FREQUENCY  PT (By licensed PT), OT (By licensed OT)     PT Frequency: 5x weekly OT Frequency: 3x weekly             Contractures Contractures Info: Not present    Additional Factors Info  Code Status, Allergies Code Status Info: Full Code Allergies Info: Liptor           Current Medications (03/29/2023):  This is the current hospital active medication list Current Facility-Administered Medications  Medication Dose Route Frequency Provider Last Rate Last Admin   acetaminophen (TYLENOL) tablet 1,000 mg  1,000 mg Oral Q8H Montez Morita, PA-C   1,000 mg at 03/29/23 0175   apixaban (ELIQUIS) tablet 2.5 mg  2.5 mg Oral BID Willette Cluster, MD   2.5 mg at 03/29/23 1025   hydrOXYzine (ATARAX) tablet 10 mg  10 mg Oral Once PRN Evlyn Kanner, MD       insulin aspart (novoLOG) injection 0-9 Units  0-9 Units Subcutaneous TID WC Evlyn Kanner, MD   2 Units at 03/28/23 1348   oxyCODONE (Oxy IR/ROXICODONE) immediate release tablet 10 mg  10 mg Oral Q4H PRN Montez Morita, PA-C   10 mg at 03/29/23 8527   polyethylene glycol (MIRALAX / GLYCOLAX) packet 17 g  17 g Oral Daily Montez Morita, PA-C   17 g at 03/29/23 7824   senna-docusate (Senokot-S) tablet 1 tablet  1 tablet Oral QHS Montez Morita, PA-C   1 tablet at 03/28/23 2149   sodium chloride (OCEAN) 0.65 % nasal spray 1 spray  1 spray  Each Nare PRN Mercie Eon, MD         Discharge Medications: Please see discharge summary for a list of discharge medications.  Relevant Imaging Results:  Relevant Lab Results:   Additional Information SS# [id number]  Helene Kelp, LCSW

## 2023-03-29 NOTE — Care Plan (Addendum)
Patient AxOx4, reported increased pain in LLE and was medicated with partial success with PRN oxycodone. In AM at shift change, when I entered in patient's room with [place], night shift RN, patient looked depressed and uncooperative. He said "I don't want nothing, let me alone" when asked if is anything that is bothering him or if we can do anything for him. Around 9 AM I went to check the patient and he said "I can't breathe". SpO2 100% on RA, RR 18, clear lung sounds, all other VS WNL. Patient denied chest pain, N/T or nausea. Reported increased pain in LLE for which was medicated. At surgery site dressing is clean, skin color WNL, cap refill +2 all over. Knowing that yesterday patient refused his meals, I offered to order breakfast and patient initially declined saying that he has no appetite. I ended up convincing patient to order, I repositioned patient, provided peri care and assured that I will check on him again after he confirmed that his breathing is improving.  Around noon I was called while doing a dressing change that [name] is again complaining that he can't breath. I requested the charge nurse, Clarisse Gouge, to go and assess the patient and when I was able to reach the patient I find Clarisse Gouge still in the room talking with [name] nephew, [name], assuring him that the patient is stabile and in no imminent danger, with VS WNL. SpO2 for the patient 99-100% on RA, RR 18. I assumed that patient has some depression, doesn't want to stay alone, rather than respiratory issues. Dr Lafonda Mosses notified by charge RN, Clarisse Gouge and me and new orders placed. Patient's nephew appeared to be impulsive, unable/unwilling to listen the staff reassurance that his uncle was and continue to be closely monitored by the hospital staff. He requested "immediately" to be provided with the contacts of the unit manager to fill a complaint saying that his uncle told him that no one was checking on him. He even threatened to call  the "Collene Schlichter" to come at the hospital.   Clarisse Gouge, charge RN, called the Building control surveyor.

## 2023-03-29 NOTE — Progress Notes (Signed)
Orthopaedic Trauma Progress Note  SUBJECTIVE: Doing fair this morning, pain through left leg manageable with current medications.  States he wants to be left alone, does not want to be bothered.  Patient to work with therapies yesterday, unable to tolerate standing on RLE or pivoting.  No chest pain. No SOB. No nausea/vomiting. No other complaints.   OBJECTIVE:  Vitals:   03/28/23 2200 03/29/23 0445  BP: (!) 145/62 (!) 141/62  Pulse: 69 90  Resp: 17 16  Temp: 98 F (36.7 C) 98.6 F (37 C)  SpO2: 98% 98%    General: Laying in bed comfortably, NAD Respiratory: No increased work of breathing.  LLE: Dressing over the anterior knee with small amount of strikethrough, patient declines dressing change currently.  All of the dressings are clean, dry, intact.  Thigh compartments soft and compressible.  No significant calf tenderness.  Tolerates gentle knee and ankle range of motion.  Foot warm and well-perfused. + DP pulse  IMAGING: Stable post op imaging.   LABS:  Results for orders placed or performed during the hospital encounter of 03/26/23 (from the past 24 hour(s))  Glucose, capillary     Status: Abnormal   Collection Time: 03/28/23  8:51 AM  Result Value Ref Range   Glucose-Capillary 134 (H) 70 - 99 mg/dL  Glucose, capillary     Status: Abnormal   Collection Time: 03/28/23  1:37 PM  Result Value Ref Range   Glucose-Capillary 176 (H) 70 - 99 mg/dL  Glucose, capillary     Status: Abnormal   Collection Time: 03/28/23  5:36 PM  Result Value Ref Range   Glucose-Capillary 103 (H) 70 - 99 mg/dL  Glucose, capillary     Status: None   Collection Time: 03/28/23 10:21 PM  Result Value Ref Range   Glucose-Capillary 79 70 - 99 mg/dL  Glucose, capillary     Status: None   Collection Time: 03/29/23  3:36 AM  Result Value Ref Range   Glucose-Capillary 87 70 - 99 mg/dL    ASSESSMENT: [name] is a 67 y.o. male, 2 Days Post-Op s/p RETROGRADE INTRAMEDULLARY NAIL LEFT FEMUR  CV/Blood  loss: Acute blood loss anemia, Hgb 7.2 on 03/28/23. CBC pending this AM. Hemodynamically stable  PLAN: Weightbearing: NWB LLE x 6 weeks ROM: Unrestricted  Incisional and dressing care: Reinforce dressings as needed  Showering: Hold off on showering for now. Ok for bed bath Orthopedic device(s): None  Pain management:  1. Tylenol 1000 mg q 8 hours scheduled 2. Oxycodone 5-10 mg q 4 hours PRN 3. Dilaudid 0.5 mg q 4 hours PRN VTE prophylaxis: Lovenox, SCDs ID:  Ancef 2gm post op Foley/Lines:  No foley, KVO IVFs Impediments to Fracture Healing: Vit D level low normal at 33, start Vit D supplementation Dispo: PT/OT eval ongoing. Patient may need SNF, but really wants to go home. Continue to work on pain control today.  Follow-up on CBC once results available. If d/c home, recommend Eliquis 2.5 mg BID x 21 days  Follow - up plan: 2 weeks after d/c with Dr. Apolinar Junes information: After hours and holidays please check [email or web address] for group call information for [facility]   Thompson Caul, PA-C [phone] (office) [email or web address]

## 2023-03-29 NOTE — Progress Notes (Deleted)
Unable to complete PASRR due to conflicting demographic information in [facility]. DOB and SS# confirmed with pt. SW will need to f/u Monday with [facility].   Dellie Burns, MSW, LCSW [phone] (coverage)

## 2023-03-30 ENCOUNTER — Encounter (HOSPITAL_COMMUNITY): Payer: Self-pay | Admitting: Orthopedic Surgery

## 2023-03-30 DIAGNOSIS — S728X2A Other fracture of left femur, initial encounter for closed fracture: Secondary | ICD-10-CM | POA: Diagnosis not present

## 2023-03-30 LAB — CBC
HCT: 23.6 % — ABNORMAL LOW (ref 39.0–52.0)
Hemoglobin: 7.5 g/dL — ABNORMAL LOW (ref 13.0–17.0)
MCH: 32.1 pg (ref 26.0–34.0)
MCHC: 31.8 g/dL (ref 30.0–36.0)
MCV: 100.9 fL — ABNORMAL HIGH (ref 80.0–100.0)
Platelets: 304 10*3/uL (ref 150–400)
RBC: 2.34 MIL/uL — ABNORMAL LOW (ref 4.22–5.81)
RDW: 14.5 % (ref 11.5–15.5)
WBC: 12.2 10*3/uL — ABNORMAL HIGH (ref 4.0–10.5)
nRBC: 0 % (ref 0.0–0.2)

## 2023-03-30 LAB — RENAL FUNCTION PANEL
Albumin: 2.7 g/dL — ABNORMAL LOW (ref 3.5–5.0)
Anion gap: 8 (ref 5–15)
BUN: 32 mg/dL — ABNORMAL HIGH (ref 8–23)
CO2: 23 mmol/L (ref 22–32)
Calcium: 8.3 mg/dL — ABNORMAL LOW (ref 8.9–10.3)
Chloride: 109 mmol/L (ref 98–111)
Creatinine, Ser: 0.89 mg/dL (ref 0.61–1.24)
GFR, Estimated: >60 mL/min (ref >60)
Glucose, Bld: 104 mg/dL — ABNORMAL HIGH (ref 70–99)
Phosphorus: 3.2 mg/dL (ref 2.5–4.6)
Potassium: 4 mmol/L (ref 3.5–5.1)
Sodium: 140 mmol/L (ref 135–145)

## 2023-03-30 LAB — MAGNESIUM: Magnesium: 2.2 mg/dL (ref 1.7–2.4)

## 2023-03-30 LAB — GLUCOSE, CAPILLARY
Glucose-Capillary: 97 mg/dL (ref 70–99)
Glucose-Capillary: 148 mg/dL — ABNORMAL HIGH (ref 70–99)
Glucose-Capillary: 83 mg/dL (ref 70–99)
Glucose-Capillary: 69 mg/dL — ABNORMAL LOW (ref 70–99)

## 2023-03-30 MED ORDER — OXYCODONE HCL 5 MG PO TABS
5.0000 mg | ORAL_TABLET | ORAL | Status: DC | PRN
  Administered 2023-03-30 – 2023-03-31 (×3): 5 mg via ORAL
  Filled 2023-03-30 (×3): qty 1

## 2023-03-30 NOTE — Plan of Care (Signed)
Pt nephew asked RN to make note that before pt gets discharged/transferred to SNF, nephew would like to be made aware pf transfer.

## 2023-03-30 NOTE — Progress Notes (Addendum)
Hospital day#3 Subjective:   Summary:  [name] is a 67 y.o. male with a past medical history of type 2 diabetes and hypertension, prior smoking history, chronic cough, mobility limited by chronic wheel chair use at baseline who presented after mechanical fall, admitted for L femur fracture s/p intramedullary nailing of L femur with Dr. Carola Frost on 03/26/2024 Patient found to have left femur fracture  Overnight Events: refusing to eat or drink. Intermittently drinking Ensure. Stool disimpacted yesterday per nurse. Endorses pain in his L leg at surgical site when attempting to move. Patient reports his cough is chronic and has not had increased sputum volume, change in color, chest pain/tightness or dyspnea.   Objective:  Vital signs in last 24 hours: Vitals:   03/29/23 0904 03/29/23 1232 03/29/23 2034 03/30/23 0435  BP: 128/61 (!) 150/73 129/60 130/67  Pulse: 83 87 74 84  Resp: 18 18 17 17   Temp: 98.3 F (36.8 C) 98.3 F (36.8 C) 98.1 F (36.7 C) 98.5 F (36.9 C)  TempSrc: Oral Oral    SpO2: 100% 100% 99% 99%  Weight:      Height:       Supplemental O2: Room Air SpO2: 99 % Filed Weights   03/26/23 1348 03/27/23 1154  Weight: 100.7 kg 100.7 kg    Physical Exam:  Constitutional:Chronically ill-appearing, cachectic man sitting in bed, in no acute distress HENT: normocephalic atraumatic, moist mucous membranes Neck: supple Cardiovascular: regular rate and rhythm, no m/r/g Pulmonary/Chest: normal work of breathing on room air, lungs clear to auscultation bilaterally. No wheezing Abdominal: Normal BS, soft, non-tender, non-distended. MSK: Decreased LE bulk and tone. L proximal femoral incision site covered by bandage without surrounding edema, erythema,or drainage. Mild tenderness with palpation. No pitting edema.  Neurological: alert & oriented x 3, 5/5 strength in bilateral upper and lower extremities, normal gait Skin: warm and dry Psych: Pleasant mood and  affect    Intake/Output Summary (Last 24 hours) at 03/30/2023 0628 Last data filed at 03/30/2023 0434 Gross per 24 hour  Intake --  Output 700 ml  Net -700 ml   Net IO Since Admission: 568.3 mL [03/30/23 0628]  Pertinent Labs:    Latest Ref Rng & Units 03/30/2023    4:14 AM 03/29/2023   12:25 PM 03/28/2023    3:31 AM  CBC  WBC 4.0 - 10.5 K/uL 12.2  12.6  13.0   Hemoglobin 13.0 - 17.0 g/dL 7.5  7.1  7.2   Hematocrit 39.0 - 52.0 % 23.6  22.4  21.3   Platelets 150 - 400 K/uL 304  308  227        Latest Ref Rng & Units 03/30/2023    4:14 AM 03/29/2023   12:25 PM 03/28/2023    3:31 AM  CMP  Glucose 70 - 99 mg/dL 562  130  865   BUN 8 - 23 mg/dL 32  34  46   Creatinine 0.61 - 1.24 mg/dL 7.84  6.96  2.95   Sodium 135 - 145 mmol/L 140  139  137   Potassium 3.5 - 5.1 mmol/L 4.0  4.3  4.3   Chloride 98 - 111 mmol/L 109  106  105   CO2 22 - 32 mmol/L 23  21  22    Calcium 8.9 - 10.3 mg/dL 8.3  8.4  8.2     Imaging: No results found.  Assessment/Plan:   Principal Problem:   Closed femur fracture Active Problems:   AKI (acute  kidney injury)   Patient Summary: [name] is a 67 y.o. male with a past medical history of type 2 diabetes and hypertension, prior smoking history, chronic cough, mobility limited by chronic wheel chair use at baseline who presented after mechanical fall, admitted for L femur fracture s/p intramedullary nailing of L femur with Dr. Carola Frost on 03/26/2024. Patient continues to be medically stable for discharge awaiting SNF placement  Closed left femur fracture POD2 s/p IMN retrograde femoral nail on 03/27/2023 - Tylenol 1000mg  every 8 hours - Oxycodone now 5 mg every 4 hours as needed - Bowel regimen in place - Eliquis 2.5mg  twice daily x3wks for DVT prophylaxis (last day 04/17/23) - Non-weight bearing for six weeks - PT/OT  AKI, resolved Cr within normal levels now. Continue to hold Losartan with continued poor PO intake  T2DM SSI  Acute blood loss  anemia Stable Transfuse Hgb <7  Constipation Miralax daily Senokot S 1 tablet daily  Protein-Calorie malnutrition Continue to encourage diet and Ensure supplementation as able  Dispo Awaiting SNF placement  Diet: Normal VTE: Eliquis Code: Full PT/OT recs: SNF for Subacute PT, wheelchair.   Dispo: Anticipated discharge to Skilled nursing facility in 1 days pending SNF placement.   Morene Crocker, MD Internal Medicine Resident PGY-1 Please contact the on call pager after 5 pm and on weekends at [phone].

## 2023-03-30 NOTE — Progress Notes (Signed)
Patient has been refusing to eat or to drink anything. Ensure supplement was offered with bedtime meds, patient only took a sip. We will continue to encourage and offer snacks and drinks when the patient is awake.

## 2023-03-31 DIAGNOSIS — S728X2A Other fracture of left femur, initial encounter for closed fracture: Secondary | ICD-10-CM | POA: Diagnosis not present

## 2023-03-31 LAB — GLUCOSE, CAPILLARY
Glucose-Capillary: 61 mg/dL — ABNORMAL LOW (ref 70–99)
Glucose-Capillary: 66 mg/dL — ABNORMAL LOW (ref 70–99)
Glucose-Capillary: 105 mg/dL — ABNORMAL HIGH (ref 70–99)
Glucose-Capillary: 97 mg/dL (ref 70–99)
Glucose-Capillary: 92 mg/dL (ref 70–99)

## 2023-03-31 MED ORDER — LOSARTAN POTASSIUM 50 MG PO TABS
25.0000 mg | ORAL_TABLET | Freq: Every day | ORAL | Status: DC
  Administered 2023-03-31 – 2023-04-01 (×2): 25 mg via ORAL
  Filled 2023-03-31 (×3): qty 1

## 2023-03-31 NOTE — Progress Notes (Signed)
Physical Therapy Treatment Patient Details Name: [name] MRN: [id number] DOB: Jan 17, 1956 Today's Date: 03/31/2023   History of Present Illness Mr. Feicht is a 67 yo male who presented with L hip pain after a fall OOB. He is now s/p Retrograde nail of Left distal femur fracture 4/5. PMHs: of DM, HTN, HLD, and chronic pain syndrome    PT Comments    Pt reporting severe LLE pain, is self-limiting about this and fearful of falling throughout session. Pt continues to require significant physical assist for bed mobility and transfers, though does reach full standing x1 today with max +2 assist. Pt quickly sat back down given fatigue and anxiety about falling, good maintenance of LLE NWB precautions. Pt tolerated very limited LLE exercise, again secondary to pain. PT to continue to follow.     Recommendations for follow up therapy are one component of a multi-disciplinary discharge planning process, led by the attending physician.  Recommendations may be updated based on patient status, additional functional criteria and insurance authorization.  Follow Up Recommendations       Assistance Recommended at Discharge Frequent or constant Supervision/Assistance  Patient can return home with the following Two people to help with walking and/or transfers;A lot of help with bathing/dressing/bathroom   Equipment Recommendations  Rolling walker (2 wheels)    Recommendations for Other Services       Precautions / Restrictions Precautions Precautions: Fall;Knee Precaution Comments: NWB LLE Restrictions Weight Bearing Restrictions: Yes LLE Weight Bearing: Non weight bearing     Mobility  Bed Mobility Overal bed mobility: Needs Assistance Bed Mobility: Rolling, Sidelying to Sit, Sit to Supine Rolling: Mod assist Sidelying to sit: Max assist, +2 for physical assistance   Sit to supine: +2 for physical assistance, Max assist   General bed mobility comments: pt utilized L arm to help  pull upper rail and scoot himself closer to [facility] in supine when transferring back into bed was initiated    Transfers Overall transfer level: Needs assistance Equipment used: Rolling walker (2 wheels) Transfers: Sit to/from Stand Sit to Stand: Max assist, +2 physical assistance, From elevated surface           General transfer comment: assist for power up, keeping LLE NWB, steadying, holding RW steady. Stand attempts x2, sat prematurely first attempt and feet slipped anteriorly on second attempt and declined another attempt.    Ambulation/Gait                   Stairs             Wheelchair Mobility    Modified Rankin (Stroke Patients Only)       Balance Overall balance assessment: Needs assistance Sitting-balance support: Bilateral upper extremity supported, Feet supported Sitting balance-Leahy Scale: Poor Sitting balance - Comments: pt needed cueing for widening BOS when feet supported on floor; pt needed cueing for BUE support placement on bed when sitting EOB Postural control: Right lateral lean Standing balance support: Bilateral upper extremity supported, Reliant on assistive device for balance Standing balance-Leahy Scale: Zero Standing balance comment: pt completed one stand (lasted for about 10 seconds) before self-limiting pain brought pt back to sitting EOB; after about 15 sec reset and rest, a second stand was attempted but incomplete clearance from EOB due to pt keeping hands on bed in fear                            Cognition Arousal/Alertness:  Awake/alert Behavior During Therapy: Flat affect, Anxious Overall Cognitive Status: No family/caregiver present to determine baseline cognitive functioning                                 General Comments: pt was very anxious due to pt reports of fear of falling; arms were trembling while sitting at EOB        Exercises      General Comments General comments (skin  integrity, edema, etc.): PT noticed bruise on posterior L shin (pt said was from the fall), L posterior hip questionable wound, RN notified      Pertinent Vitals/Pain Pain Assessment Pain Assessment: Faces Faces Pain Scale: Hurts whole lot Pain Location: L knee Pain Descriptors / Indicators: Discomfort, Grimacing, Operative site guarding Pain Intervention(s): Limited activity within patient's tolerance, Monitored during session, Repositioned    Home Living                          Prior Function            PT Goals (current goals can now be found in the care plan section) Acute Rehab PT Goals Patient Stated Goal: less pain PT Goal Formulation: With patient Time For Goal Achievement: 04/11/23 Potential to Achieve Goals: Fair Progress towards PT goals: Progressing toward goals    Frequency    Min 3X/week      PT Plan Current plan remains appropriate    Co-evaluation              AM-PAC PT "6 Clicks" Mobility   Outcome Measure  Help needed turning from your back to your side while in a flat bed without using bedrails?: A Lot Help needed moving from lying on your back to sitting on the side of a flat bed without using bedrails?: A Lot Help needed moving to and from a bed to a chair (including a wheelchair)?: Total Help needed standing up from a chair using your arms (e.g., wheelchair or bedside chair)?: Total Help needed to walk in hospital room?: Total Help needed climbing 3-5 steps with a railing? : Total 6 Click Score: 8    End of Session Equipment Utilized During Treatment: Gait belt Activity Tolerance: Patient limited by pain Patient left: in bed;with call bell/phone within reach;with bed alarm set Nurse Communication: Other (comment);Precautions;Mobility status (PT notified the nurse of pt's BM (which was ongoing/started at end of session)) PT Visit Diagnosis: Pain;Other abnormalities of gait and mobility (R26.89) Pain - Right/Left: Left Pain  - part of body: Leg     Time: 2993-7169 PT Time Calculation (min) (ACUTE ONLY): 23 min  Charges:  $Therapeutic Activity: 8-22 mins                     Marye Round, PT DPT Acute Rehabilitation Services Pager [phone]  Office [phone]    Truddie Coco 03/31/2023, 4:38 PM

## 2023-03-31 NOTE — Progress Notes (Signed)
Hospital day#4 Subjective:   Summary:  [name] is a 67 y.o. male with a past medical history of type 2 diabetes and hypertension, prior smoking history, chronic cough, mobility limited by chronic wheel chair use at baseline who presented after mechanical fall, admitted for L femur fracture s/p intramedullary nailing of L femur with Dr. Carola Frost on 03/26/2024 Patient found to have left femur fracture  Patient with large bowel movement this AM and abdominal discomfort. He has received multiople rounds of stool softeners as he had a large impaction yesterday. Patient continues to endorse low appetite and low interest in drinking Glucerna, though is redirectable. While in the room, patient's blood sugar was low and patient promptly finished a cup of apple juice and a Glucerna container. Patient aware of PO intake needs and amenable to increasing PO intake.   Patient denies chest pain, shortness of breath. Continues to endorse L upper leg pain with movement around incision sites, especially when turned for genital clean up.    Objective:  Vital signs in last 24 hours: Vitals:   03/30/23 0817 03/30/23 1651 03/30/23 1957 03/31/23 0340  BP: (!) 115/46 (!) 146/67 (!) 153/65 (!) 145/84  Pulse: 79 84 70 84  Resp: 16 16 17 17   Temp:  99.6 F (37.6 C) 98.1 F (36.7 C) 98.3 F (36.8 C)  TempSrc:      SpO2: 100% 100% 100% 100%  Weight:      Height:       Supplemental O2: Room Air SpO2: 100 % Filed Weights   03/26/23 1348 03/27/23 1154  Weight: 100.7 kg 100.7 kg    Physical Exam:  General: Pleasant, chromically ill-appearing man laying in bed. No acute distress. Head: Normocephalic. Atraumatic. CV: RRR. No murmurs, rubs, or gallops. No LE edema Pulmonary: Lungs CTAB. Normal effort. No wheezing or rales. Abdominal: Soft, nontender, nondistended. Normal bowel sounds. Extremities: Palpable radial and DP pulses. Decreased LE bulk and tone. Proximal and distal incision sites with sutures  in place without erythema, edema, or drainage. Tenderness to palpation to surrounding areas. Skin: Warm and dry. No obvious rash or lesions. Neuro: A&Ox3. Psych: Normal mood and affect     Intake/Output Summary (Last 24 hours) at 03/31/2023 0732 Last data filed at 03/31/2023 0340 Gross per 24 hour  Intake 120 ml  Output 250 ml  Net -130 ml   Net IO Since Admission: 438.3 mL [03/31/23 0732]  Pertinent Labs:    Latest Ref Rng & Units 03/30/2023    4:14 AM 03/29/2023   12:25 PM 03/28/2023    3:31 AM  CBC  WBC 4.0 - 10.5 K/uL 12.2  12.6  13.0   Hemoglobin 13.0 - 17.0 g/dL 7.5  7.1  7.2   Hematocrit 39.0 - 52.0 % 23.6  22.4  21.3   Platelets 150 - 400 K/uL 304  308  227        Latest Ref Rng & Units 03/30/2023    4:14 AM 03/29/2023   12:25 PM 03/28/2023    3:31 AM  CMP  Glucose 70 - 99 mg/dL 161  096  045   BUN 8 - 23 mg/dL 32  34  46   Creatinine 0.61 - 1.24 mg/dL 4.09  8.11  9.14   Sodium 135 - 145 mmol/L 140  139  137   Potassium 3.5 - 5.1 mmol/L 4.0  4.3  4.3   Chloride 98 - 111 mmol/L 109  106  105   CO2 22 -  32 mmol/L 23  21  22    Calcium 8.9 - 10.3 mg/dL 8.3  8.4  8.2     Imaging: No results found.  Assessment/Plan:   Principal Problem:   Closed femur fracture Active Problems:   AKI (acute kidney injury)   Patient Summary: [name] is a 67 y.o. male with a past medical history of type 2 diabetes and hypertension, prior smoking history, chronic cough, mobility limited by chronic wheel chair use at baseline who presented after mechanical fall, admitted for L femur fracture s/p intramedullary nailing of L femur with Dr. Carola Frost on 03/26/2024. Patient continues to be medically stable for discharge awaiting SNF placement  Closed left femur fracture POD2 s/p IMN retrograde femoral nail on 03/27/2023 Tylenol 1000mg  every 8 hours Oxycodone now 5 mg every 4 hours as needed Bowel regimen in place Eliquis 2.5mg  twice daily x3wks for DVT prophylaxis (last day  04/17/23) Non-weight bearing for six weeks PT/OT  HTN Patient hypertensive today Restarted on 25 mg Losartan   Protein-Calorie malnutrition Hypoglycemia Patient with low appetite with an episode of hypoglycemic episode today resolved with PO intake. Upon recheck, patient continues to eat minimally with liquid intake with encouragement. BG >100. Will not favor IVF resuscitation at this time. -Continue to encourage diet and Ensure supplementation as able -Follow BMP tomorrow for signs of hypernatremia  T2DM Patient is not needing SSI per above.  -Will continue to monitor cBG  Acute blood loss anemia Stable Transfuse Hgb <7  Constipation Prior fecal impaction with multiple rounds of bowel regimen meds for the past three days. This has now  Now resolved - patient with multiple large bowel movements today. -will d/c bowel regimen for now -Continue to monitor for signs of volume contraction -Continue monitor  AKI, resolved Cr within normal levels now.  Dispo Awaiting SNF placement  Diet: Normal VTE: Eliquis Code: Full PT/OT recs: SNF for Subacute PT, wheelchair.   Dispo: Anticipated discharge to Skilled nursing facility in 1 days pending SNF placement.   Morene Crocker, MD Internal Medicine Resident PGY-1 Please contact the on call pager after 5 pm and on weekends at [phone].

## 2023-03-31 NOTE — Care Management Important Message (Signed)
Important Message  Patient Details  Name: [name] MRN: [id number] Date of Birth: 1956-09-05   Medicare Important Message Given:  Yes     [name] 03/31/2023, 2:19 PM

## 2023-03-31 NOTE — TOC Progression Note (Signed)
Transition of Care [facility]) - Progression Note    Patient Details  Name: [name] MRN: [id number] Date of Birth: 1956/10/06  [facility] CM/SW Contact  Leander Rams, LCSW Phone Number: 03/31/2023, 2:24 PM  Clinical Narrative:    CSW met with pt to discuss SNF bed offers. Pt is agreeable to dc to [name] for STR. CSW reach out to SNF to learn of earliest availability, there was no answer and a VM was left.   TOC will continue to follow this admission.   Expected Discharge Plan: Skilled Nursing Facility Barriers to Discharge: Other (must enter comment) (PASSR completion pending the verified social security number.)  Expected Discharge Plan and Services In-house Referral: Clinical Social Work   Post Acute Care Choice: Skilled Nursing Facility Living arrangements for the past 2 months: Apartment                                       Social Determinants of Health (SDOH) Interventions SDOH Screenings   Food Insecurity: No Food Insecurity (03/26/2023)  Housing: Low Risk  (03/27/2023)  Transportation Needs: No Transportation Needs (03/26/2023)  Utilities: Not At Risk (03/26/2023)  Tobacco Use: High Risk (03/30/2023)    Readmission Risk Interventions    09/17/2020    3:07 PM 08/01/2020   11:51 AM  Readmission Risk Prevention Plan  Post Dischage Appt Not Complete Complete  Appt Comments SNF placement   Medication Screening Complete Complete  Transportation Screening Complete Complete   Oletta Lamas, MSW, LCSWA, LCASA Transitions of Care  Clinical Social Worker I

## 2023-04-01 DIAGNOSIS — S7292XA Unspecified fracture of left femur, initial encounter for closed fracture: Secondary | ICD-10-CM | POA: Diagnosis not present

## 2023-04-01 LAB — GLUCOSE, CAPILLARY
Glucose-Capillary: 99 mg/dL (ref 70–99)
Glucose-Capillary: 92 mg/dL (ref 70–99)
Glucose-Capillary: 69 mg/dL — ABNORMAL LOW (ref 70–99)

## 2023-04-01 MED ORDER — OXYCODONE HCL 5 MG PO TABS
10.0000 mg | ORAL_TABLET | Freq: Four times a day (QID) | ORAL | Status: DC | PRN
  Administered 2023-04-01 (×2): 10 mg via ORAL
  Filled 2023-04-01 (×2): qty 2

## 2023-04-01 MED ORDER — APIXABAN 2.5 MG PO TABS: 2.5000 mg | ORAL_TABLET | Freq: Two times a day (BID) | ORAL | Status: DC

## 2023-04-01 MED ORDER — MIRTAZAPINE 15 MG PO TBDP: 15.0000 mg | ORAL_TABLET | Freq: Every day | ORAL | 3 refills | Status: DC

## 2023-04-01 MED ORDER — OXYCODONE HCL 10 MG PO TABS: 10.0000 mg | ORAL_TABLET | Freq: Four times a day (QID) | ORAL | 0 refills | Status: DC | PRN

## 2023-04-01 NOTE — TOC Transition Note (Addendum)
Transition of Care [facility]) - CM/SW Discharge Note   Patient Details  Name: [name] MRN: [id number] Date of Birth: 1956-02-17  Transition of Care [facility]) CM/SW Contact:  Paiton Boultinghouse A [place], Theresia Majors Phone Number: 04/01/2023, 3:20 PM   Clinical Narrative:     Berkley Harvey approved 4/10-/24  04/03/23 Reference#: [id number]  Patient will DC to: [name]  Anticipated DC date: 4/1/0/24  Family notified: Tyrone Nine  Transport by: Sharin Mons    Per MD patient ready for DC to [name]. RN, patient, patient's family, and facility notified of DC. Discharge Summary and FL2 sent to facility. RN to call report prior to discharge [phone]. DC packet on chart. Ambulance transport requested for patient.     CSW will sign off for now as social work intervention is no longer needed. Please consult [place] again if new needs arise.   Final next level of care: Skilled Nursing Facility Barriers to Discharge: No Barriers Identified   Patient Goals and CMS Choice CMS Medicare.gov Compare Post Acute Care list provided to:: Patient Represenative (must comment) (vSIGMON,WILL  Grandson) Choice offered to / list presented to : Patient  Discharge Placement                Patient chooses bed at: [name] Patient to be transferred to facility by: [facility] Name of family member notified: Tyrone Nine Patient and family notified of of transfer: 04/01/23  Discharge Plan and Services Additional resources added to the After Visit Summary for   In-house Referral: Clinical Social Work   Post Acute Care Choice: Skilled Nursing Facility                               Social Determinants of Health (SDOH) Interventions SDOH Screenings   Food Insecurity: No Food Insecurity (03/26/2023)  Housing: Low Risk  (03/27/2023)  Transportation Needs: No Transportation Needs (03/26/2023)  Utilities: Not At Risk (03/26/2023)  Tobacco Use: High Risk (03/30/2023)     Readmission Risk Interventions    09/17/2020    3:07  PM 08/01/2020   11:51 AM  Readmission Risk Prevention Plan  Post Dischage Appt Not Complete Complete  Appt Comments SNF placement   Medication Screening Complete Complete  Transportation Screening Complete Complete

## 2023-04-01 NOTE — Plan of Care (Signed)
Patient AOX3, disoriented to time, forgetful.  VSS throughout shift.  Pt denied pain and SOB.  Diminished lungs, IS encouraged.  All meds given on time as ordered.  LLE bigger than RLE at baseline.  POC maintained, will continue to monitor.  Problem: Education: Goal: Knowledge of General Education information will improve Description: Including pain rating scale, medication(s)/side effects and non-pharmacologic comfort measures Outcome: Progressing   Problem: Health Behavior/Discharge Planning: Goal: Ability to manage health-related needs will improve Outcome: Progressing   Problem: Clinical Measurements: Goal: Ability to maintain clinical measurements within normal limits will improve Outcome: Progressing Goal: Will remain free from infection Outcome: Progressing Goal: Diagnostic test results will improve Outcome: Progressing Goal: Respiratory complications will improve Outcome: Progressing Goal: Cardiovascular complication will be avoided Outcome: Progressing   Problem: Activity: Goal: Risk for activity intolerance will decrease Outcome: Progressing   Problem: Nutrition: Goal: Adequate nutrition will be maintained Outcome: Progressing   Problem: Coping: Goal: Level of anxiety will decrease Outcome: Progressing   Problem: Elimination: Goal: Will not experience complications related to bowel motility Outcome: Progressing Goal: Will not experience complications related to urinary retention Outcome: Progressing   Problem: Pain Managment: Goal: General experience of comfort will improve Outcome: Progressing   Problem: Safety: Goal: Ability to remain free from injury will improve Outcome: Progressing   Problem: Skin Integrity: Goal: Risk for impaired skin integrity will decrease Outcome: Progressing   Problem: Education: Goal: Ability to describe self-care measures that may prevent or decrease complications (Diabetes Survival Skills Education) will improve Outcome:  Progressing Goal: Individualized Educational Video(s) Outcome: Progressing   Problem: Coping: Goal: Ability to adjust to condition or change in health will improve Outcome: Progressing   Problem: Fluid Volume: Goal: Ability to maintain a balanced intake and output will improve Outcome: Progressing   Problem: Health Behavior/Discharge Planning: Goal: Ability to identify and utilize available resources and services will improve Outcome: Progressing Goal: Ability to manage health-related needs will improve Outcome: Progressing   Problem: Metabolic: Goal: Ability to maintain appropriate glucose levels will improve Outcome: Progressing   Problem: Nutritional: Goal: Maintenance of adequate nutrition will improve Outcome: Progressing Goal: Progress toward achieving an optimal weight will improve Outcome: Progressing   Problem: Skin Integrity: Goal: Risk for impaired skin integrity will decrease Outcome: Progressing   Problem: Tissue Perfusion: Goal: Adequacy of tissue perfusion will improve Outcome: Progressing

## 2023-04-01 NOTE — Progress Notes (Signed)
RN attempted to call report to [facility]. No answer, so RN left brief voicemail and callback number. Will attempt again.

## 2023-04-01 NOTE — Progress Notes (Signed)
RN gave report to [place] at [facility].

## 2023-04-01 NOTE — Progress Notes (Signed)
Occupational Therapy Treatment Patient Details Name: COLESTON SARFF MRN: [id number] DOB: [birth year] Today's Date: 04/01/2023   History of present illness Mr. Chung is a 67 yo male who presented with L hip pain after a fall OOB. He is now s/p Retrograde nail of Left distal femur fracture 4/5. PMHs: of DM, HTN, HLD, and chronic pain syndrome   OT comments  Patient seen following receiving pain meds earlier. Patient demonstrated gains with bed mobility and sitting balance with patient able to maintain sitting balance while performing grooming tasks. Patient with limited sitting tolerance and was found to had bowel incontinence and assisted with cleaning with patient rolling side to side with mod assist. Patient will benefit from continued inpatient follow up therapy, <3 hours/day to continue to address bed mobility, transfers, and LB ADLs. Acute OT to continue to follow.     Recommendations for follow up therapy are one component of a multi-disciplinary discharge planning process, led by the attending physician.  Recommendations may be updated based on patient status, additional functional criteria and insurance authorization.    Assistance Recommended at Discharge Frequent or constant Supervision/Assistance  Patient can return home with the following  A lot of help with walking and/or transfers;Two people to help with walking and/or transfers;A lot of help with bathing/dressing/bathroom;Two people to help with bathing/dressing/bathroom;Assistance with cooking/housework;Assistance with feeding;Direct supervision/assist for medications management;Direct supervision/assist for financial management;Assist for transportation;Help with stairs or ramp for entrance   Equipment Recommendations  Other (comment) (defer)    Recommendations for Other Services      Precautions / Restrictions Precautions Precautions: Fall;Knee Precaution Comments: NWB LLE Restrictions Weight Bearing Restrictions:  Yes LLE Weight Bearing: Non weight bearing Other Position/Activity Restrictions: x6 weeks       Mobility Bed Mobility Overal bed mobility: Needs Assistance Bed Mobility: Rolling, Sidelying to Sit, Sit to Supine Rolling: Mod assist Sidelying to sit: Max assist   Sit to supine: Max assist   General bed mobility comments: increased time and assistance with trunk and BLE. Patient attempting to asist with LUE    Transfers Overall transfer level: Needs assistance                 General transfer comment: patient declined standing or transfers     Balance Overall balance assessment: Needs assistance Sitting-balance support: Single extremity supported, Bilateral upper extremity supported Sitting balance-Leahy Scale: Poor Sitting balance - Comments: able to perform grooming tasks seated on EOB Postural control: Right lateral lean                                 ADL either performed or assessed with clinical judgement   ADL Overall ADL's : Needs assistance/impaired     Grooming: Wash/dry hands;Wash/dry face;Oral care;Supervision/safety;Sitting Grooming Details (indicate cue type and reason): on EOB     Lower Body Bathing: Maximal assistance;Bed level Lower Body Bathing Details (indicate cue type and reason): patient was mod assist to roll to right and left to provide cleaning following bowel incontinence                       General ADL Comments: patient fearful of falling when sitting on EOB    Extremity/Trunk Assessment              Vision       Perception     Praxis      Cognition Arousal/Alertness: Awake/alert Behavior During  Therapy: Flat affect, Anxious Overall Cognitive Status: No family/caregiver present to determine baseline cognitive functioning Area of Impairment: Safety/judgement, Awareness                         Safety/Judgement: Decreased awareness of safety, Decreased awareness of deficits Awareness:  Emergent   General Comments: anxious when sitting on EOB and performing lateral scooting        Exercises      Shoulder Instructions       General Comments      Pertinent Vitals/ Pain       Pain Assessment Pain Assessment: Faces Faces Pain Scale: Hurts even more Pain Location: L knee Pain Descriptors / Indicators: Discomfort, Grimacing, Operative site guarding Pain Intervention(s): Limited activity within patient's tolerance, Monitored during session, Repositioned, Premedicated before session  Home Living                                          Prior Functioning/Environment              Frequency  Min 2X/week        Progress Toward Goals  OT Goals(current goals can now be found in the care plan section)  Progress towards OT goals: Progressing toward goals  Acute Rehab OT Goals Patient Stated Goal: get stronger OT Goal Formulation: With patient Time For Goal Achievement: 04/11/23 Potential to Achieve Goals: Good ADL Goals Pt Will Perform Lower Body Bathing: with mod assist;sitting/lateral leans Pt Will Perform Lower Body Dressing: with mod assist;sitting/lateral leans Pt Will Transfer to Toilet: with mod assist;stand pivot transfer;bedside commode Additional ADL Goal #1: Pt will complete bed mobility with supervision A as a precursor to ADLs  Plan Discharge plan remains appropriate    Co-evaluation                 AM-PAC OT "6 Clicks" Daily Activity     Outcome Measure   Help from another person eating meals?: A Little Help from another person taking care of personal grooming?: A Little Help from another person toileting, which includes using toliet, bedpan, or urinal?: A Lot Help from another person bathing (including washing, rinsing, drying)?: A Lot Help from another person to put on and taking off regular upper body clothing?: A Little Help from another person to put on and taking off regular lower body clothing?: A  Lot 6 Click Score: 15    End of Session    OT Visit Diagnosis: Unsteadiness on feet (R26.81);Other abnormalities of gait and mobility (R26.89);Muscle weakness (generalized) (M62.81);History of falling (Z91.81);Pain Pain - Right/Left: Left Pain - part of body: Knee   Activity Tolerance Patient limited by pain   Patient Left in bed;with call bell/phone within reach;with bed alarm set   Nurse Communication Mobility status        Time: 6629-4765 OT Time Calculation (min): 20 min  Charges: OT General Charges $OT Visit: 1 Visit OT Treatments $Self Care/Home Management : 8-22 mins  Alfonse Flavors, OTA Acute Rehabilitation Services  Office [phone]   Dewain Penning 04/01/2023, 12:16 PM

## 2023-04-01 NOTE — Progress Notes (Signed)
RN notified by lab that patient is refusing labs this am. RN notified Morene Crocker, MD. Care continues.

## 2023-04-30 ENCOUNTER — Inpatient Hospital Stay (HOSPITAL_COMMUNITY)
Admission: EM | Admit: 2023-04-30 | Discharge: 2023-05-04 | DRG: 377 | Disposition: A | Discharge status: Home Health | Payer: 59 | Attending: Infectious Diseases | Admitting: Infectious Diseases

## 2023-04-30 ENCOUNTER — Other Ambulatory Visit: Payer: Self-pay

## 2023-04-30 DIAGNOSIS — E119 Type 2 diabetes mellitus without complications: Secondary | ICD-10-CM | POA: Diagnosis present

## 2023-04-30 DIAGNOSIS — K449 Diaphragmatic hernia without obstruction or gangrene: Secondary | ICD-10-CM | POA: Diagnosis present

## 2023-04-30 DIAGNOSIS — E861 Hypovolemia: Secondary | ICD-10-CM | POA: Diagnosis present

## 2023-04-30 DIAGNOSIS — M62838 Other muscle spasm: Secondary | ICD-10-CM | POA: Diagnosis present

## 2023-04-30 DIAGNOSIS — S72402D Unspecified fracture of lower end of left femur, subsequent encounter for closed fracture with routine healing: Secondary | ICD-10-CM | POA: Diagnosis not present

## 2023-04-30 DIAGNOSIS — Z888 Allergy status to other drugs, medicaments and biological substances status: Secondary | ICD-10-CM

## 2023-04-30 DIAGNOSIS — R54 Age-related physical debility: Secondary | ICD-10-CM

## 2023-04-30 DIAGNOSIS — K209 Esophagitis, unspecified without bleeding: Secondary | ICD-10-CM

## 2023-04-30 DIAGNOSIS — K269 Duodenal ulcer, unspecified as acute or chronic, without hemorrhage or perforation: Secondary | ICD-10-CM

## 2023-04-30 DIAGNOSIS — G894 Chronic pain syndrome: Secondary | ICD-10-CM | POA: Diagnosis present

## 2023-04-30 DIAGNOSIS — E43 Unspecified severe protein-calorie malnutrition: Secondary | ICD-10-CM | POA: Diagnosis present

## 2023-04-30 DIAGNOSIS — F32A Depression, unspecified: Secondary | ICD-10-CM | POA: Diagnosis present

## 2023-04-30 DIAGNOSIS — K5900 Constipation, unspecified: Secondary | ICD-10-CM | POA: Diagnosis present

## 2023-04-30 DIAGNOSIS — F17210 Nicotine dependence, cigarettes, uncomplicated: Secondary | ICD-10-CM | POA: Diagnosis present

## 2023-04-30 DIAGNOSIS — K921 Melena: Secondary | ICD-10-CM

## 2023-04-30 DIAGNOSIS — N179 Acute kidney failure, unspecified: Secondary | ICD-10-CM | POA: Diagnosis not present

## 2023-04-30 DIAGNOSIS — L89150 Pressure ulcer of sacral region, unstageable: Secondary | ICD-10-CM | POA: Diagnosis not present

## 2023-04-30 DIAGNOSIS — K922 Gastrointestinal hemorrhage, unspecified: Principal | ICD-10-CM

## 2023-04-30 DIAGNOSIS — R296 Repeated falls: Secondary | ICD-10-CM

## 2023-04-30 DIAGNOSIS — K250 Acute gastric ulcer with hemorrhage: Secondary | ICD-10-CM | POA: Diagnosis not present

## 2023-04-30 DIAGNOSIS — K279 Peptic ulcer, site unspecified, unspecified as acute or chronic, without hemorrhage or perforation: Secondary | ICD-10-CM | POA: Diagnosis not present

## 2023-04-30 DIAGNOSIS — D62 Acute posthemorrhagic anemia: Secondary | ICD-10-CM | POA: Diagnosis present

## 2023-04-30 DIAGNOSIS — I959 Hypotension, unspecified: Secondary | ICD-10-CM | POA: Diagnosis not present

## 2023-04-30 DIAGNOSIS — K2100 Gastro-esophageal reflux disease with esophagitis, without bleeding: Secondary | ICD-10-CM | POA: Diagnosis present

## 2023-04-30 DIAGNOSIS — K222 Esophageal obstruction: Secondary | ICD-10-CM | POA: Diagnosis present

## 2023-04-30 DIAGNOSIS — I10 Essential (primary) hypertension: Secondary | ICD-10-CM | POA: Diagnosis present

## 2023-04-30 DIAGNOSIS — Z7401 Bed confinement status: Secondary | ICD-10-CM | POA: Diagnosis not present

## 2023-04-30 DIAGNOSIS — I4891 Unspecified atrial fibrillation: Secondary | ICD-10-CM | POA: Diagnosis present

## 2023-04-30 DIAGNOSIS — Z6820 Body mass index (BMI) 20.0-20.9, adult: Secondary | ICD-10-CM

## 2023-04-30 DIAGNOSIS — F17200 Nicotine dependence, unspecified, uncomplicated: Secondary | ICD-10-CM | POA: Diagnosis not present

## 2023-04-30 DIAGNOSIS — E785 Hyperlipidemia, unspecified: Secondary | ICD-10-CM | POA: Diagnosis present

## 2023-04-30 DIAGNOSIS — Z79899 Other long term (current) drug therapy: Secondary | ICD-10-CM | POA: Diagnosis not present

## 2023-04-30 DIAGNOSIS — K254 Chronic or unspecified gastric ulcer with hemorrhage: Principal | ICD-10-CM | POA: Diagnosis present

## 2023-04-30 DIAGNOSIS — G8220 Paraplegia, unspecified: Secondary | ICD-10-CM | POA: Insufficient documentation

## 2023-04-30 DIAGNOSIS — M79669 Pain in unspecified lower leg: Secondary | ICD-10-CM | POA: Diagnosis not present

## 2023-04-30 DIAGNOSIS — K259 Gastric ulcer, unspecified as acute or chronic, without hemorrhage or perforation: Secondary | ICD-10-CM | POA: Diagnosis not present

## 2023-04-30 DIAGNOSIS — Z602 Problems related to living alone: Secondary | ICD-10-CM | POA: Diagnosis present

## 2023-04-30 DIAGNOSIS — L89152 Pressure ulcer of sacral region, stage 2: Secondary | ICD-10-CM | POA: Diagnosis present

## 2023-04-30 DIAGNOSIS — D72829 Elevated white blood cell count, unspecified: Secondary | ICD-10-CM

## 2023-04-30 DIAGNOSIS — R791 Abnormal coagulation profile: Secondary | ICD-10-CM | POA: Diagnosis present

## 2023-04-30 DIAGNOSIS — J9690 Respiratory failure, unspecified, unspecified whether with hypoxia or hypercapnia: Secondary | ICD-10-CM | POA: Diagnosis not present

## 2023-04-30 DIAGNOSIS — Z7901 Long term (current) use of anticoagulants: Secondary | ICD-10-CM

## 2023-04-30 DIAGNOSIS — Z8711 Personal history of peptic ulcer disease: Secondary | ICD-10-CM

## 2023-04-30 DIAGNOSIS — E46 Unspecified protein-calorie malnutrition: Secondary | ICD-10-CM | POA: Diagnosis not present

## 2023-04-30 DIAGNOSIS — M549 Dorsalgia, unspecified: Secondary | ICD-10-CM | POA: Diagnosis not present

## 2023-04-30 LAB — CBC WITH DIFFERENTIAL/PLATELET
Abs Immature Granulocytes: 0.05 10*3/uL (ref 0.00–0.07)
Basophils Absolute: 0.1 10*3/uL (ref 0.0–0.1)
Basophils Relative: 1 %
Eosinophils Absolute: 0.1 10*3/uL (ref 0.0–0.5)
Eosinophils Relative: 1 %
HCT: 23.2 % — ABNORMAL LOW (ref 39.0–52.0)
Hemoglobin: 7.1 g/dL — ABNORMAL LOW (ref 13.0–17.0)
Immature Granulocytes: 0 %
Lymphocytes Relative: 20 %
Lymphs Abs: 2.3 10*3/uL (ref 0.7–4.0)
MCH: 31.3 pg (ref 26.0–34.0)
MCHC: 30.6 g/dL (ref 30.0–36.0)
MCV: 102.2 fL — ABNORMAL HIGH (ref 80.0–100.0)
Monocytes Absolute: 0.7 10*3/uL (ref 0.1–1.0)
Monocytes Relative: 6 %
Neutro Abs: 8.4 10*3/uL — ABNORMAL HIGH (ref 1.7–7.7)
Neutrophils Relative %: 72 %
Platelets: 355 10*3/uL (ref 150–400)
RBC: 2.27 MIL/uL — ABNORMAL LOW (ref 4.22–5.81)
RDW: 14.3 % (ref 11.5–15.5)
WBC: 11.5 10*3/uL — ABNORMAL HIGH (ref 4.0–10.5)
nRBC: 0 % (ref 0.0–0.2)

## 2023-04-30 LAB — I-STAT CHEM 8, ED
BUN: 53 mg/dL — ABNORMAL HIGH (ref 8–23)
Calcium, Ion: 1.11 mmol/L — ABNORMAL LOW (ref 1.15–1.40)
Chloride: 108 mmol/L (ref 98–111)
Creatinine, Ser: 1.4 mg/dL — ABNORMAL HIGH (ref 0.61–1.24)
Glucose, Bld: 129 mg/dL — ABNORMAL HIGH (ref 70–99)
HCT: 21 % — ABNORMAL LOW (ref 39.0–52.0)
Hemoglobin: 7.1 g/dL — ABNORMAL LOW (ref 13.0–17.0)
Potassium: 4.5 mmol/L (ref 3.5–5.1)
Sodium: 138 mmol/L (ref 135–145)
TCO2: 20 mmol/L — ABNORMAL LOW (ref 22–32)

## 2023-04-30 LAB — COMPREHENSIVE METABOLIC PANEL
ALT: 11 U/L (ref 0–44)
AST: 17 U/L (ref 15–41)
Albumin: 2.3 g/dL — ABNORMAL LOW (ref 3.5–5.0)
Alkaline Phosphatase: 81 U/L (ref 38–126)
Anion gap: 8 (ref 5–15)
BUN: 60 mg/dL — ABNORMAL HIGH (ref 8–23)
CO2: 20 mmol/L — ABNORMAL LOW (ref 22–32)
Calcium: 8 mg/dL — ABNORMAL LOW (ref 8.9–10.3)
Chloride: 109 mmol/L (ref 98–111)
Creatinine, Ser: 1.32 mg/dL — ABNORMAL HIGH (ref 0.61–1.24)
GFR, Estimated: 60 mL/min — ABNORMAL LOW (ref >60)
Glucose, Bld: 131 mg/dL — ABNORMAL HIGH (ref 70–99)
Potassium: 4.6 mmol/L (ref 3.5–5.1)
Sodium: 137 mmol/L (ref 135–145)
Total Bilirubin: 0.5 mg/dL (ref 0.3–1.2)
Total Protein: 5.5 g/dL — ABNORMAL LOW (ref 6.5–8.1)

## 2023-04-30 LAB — TYPE AND SCREEN
ABO/RH(D): B POS
Antibody Screen: NEGATIVE

## 2023-04-30 LAB — BPAM RBC: Unit Type and Rh: 5100

## 2023-04-30 LAB — HEMOGLOBIN AND HEMATOCRIT, BLOOD
HCT: 27.7 % — ABNORMAL LOW (ref 39.0–52.0)
HCT: 29.2 % — ABNORMAL LOW (ref 39.0–52.0)
Hemoglobin: 9.1 g/dL — ABNORMAL LOW (ref 13.0–17.0)
Hemoglobin: 9.6 g/dL — ABNORMAL LOW (ref 13.0–17.0)

## 2023-04-30 LAB — PROTIME-INR
INR: 1.3 — ABNORMAL HIGH (ref 0.8–1.2)
Prothrombin Time: 15.9 seconds — ABNORMAL HIGH (ref 11.4–15.2)

## 2023-04-30 LAB — PREPARE RBC (CROSSMATCH)

## 2023-04-30 LAB — POC OCCULT BLOOD, ED: Fecal Occult Bld: POSITIVE — AB

## 2023-04-30 MED ORDER — SODIUM CHLORIDE 0.9% IV SOLUTION: Freq: Once | INTRAVENOUS | Status: DC

## 2023-04-30 MED ORDER — PANTOPRAZOLE SODIUM 40 MG IV SOLR: 40.0000 mg | Freq: Two times a day (BID) | INTRAVENOUS | Status: DC

## 2023-04-30 MED ORDER — PANTOPRAZOLE 80MG IVPB - SIMPLE MED
80.0000 mg | Freq: Once | INTRAVENOUS | Status: AC
  Administered 2023-04-30: 80 mg via INTRAVENOUS
  Filled 2023-04-30: qty 100

## 2023-04-30 MED ORDER — TIZANIDINE HCL 4 MG PO TABS
4.0000 mg | ORAL_TABLET | Freq: Three times a day (TID) | ORAL | Status: DC
  Administered 2023-04-30 – 2023-05-03 (×7): 4 mg via ORAL
  Filled 2023-04-30 (×7): qty 1

## 2023-04-30 MED ORDER — POLYETHYLENE GLYCOL 3350 17 G PO PACK
17.0000 g | PACK | Freq: Every day | ORAL | Status: DC
  Administered 2023-05-02 – 2023-05-04 (×3): 17 g via ORAL
  Filled 2023-04-30 (×3): qty 1

## 2023-04-30 MED ORDER — PANTOPRAZOLE INFUSION (NEW) - SIMPLE MED
8.0000 mg/h | INTRAVENOUS | Status: DC
  Administered 2023-04-30: 8 mg/h via INTRAVENOUS
  Filled 2023-04-30 (×3): qty 100

## 2023-04-30 MED ORDER — GABAPENTIN 400 MG PO CAPS
800.0000 mg | ORAL_CAPSULE | Freq: Three times a day (TID) | ORAL | Status: DC
  Administered 2023-04-30 – 2023-05-04 (×11): 800 mg via ORAL
  Filled 2023-04-30 (×12): qty 2

## 2023-04-30 MED ORDER — MIRTAZAPINE 15 MG PO TBDP
15.0000 mg | ORAL_TABLET | Freq: Every day | ORAL | Status: DC
  Administered 2023-04-30 – 2023-05-03 (×4): 15 mg via ORAL
  Filled 2023-04-30 (×6): qty 1

## 2023-04-30 NOTE — H&P (View-Only) (Signed)
     Consultation  Referring Provider:   ER Primary Care Physician:  Patient, No Pcp Per Primary Gastroenterologist:  [facility]       Reason for Consultation: Acute GI bleed in setting of known peptic ulcer disease, hypotension/tachycardia             HPI:   [name] is a 67 y.o. male with past medical history significant for hypertension, diabetes, hyperlipidemia, chronic pain syndrome, anemia, peptic ulcer disease, elevated LFTs, constipation presents to the ER with melena.  Status post retrograde nail of left distal femur fracture on 4/5, placed on Eliquis 2.5 mg twice daily and discharged to SNF for rehab.  Admitted to [facility] 4/21 from SNF rehab secondary anemia and melanic stools. Hgb 13.6 on 09/01/2022 during hospital admission but 7.6 and FOBT positive..CT abdomen pelvis with enteritis 04/13/2023 EGD with Schatzki ring, small hiatal hernia, single ulcer lesser curvature of the stomach clean base and single ulcer in duodenal bulb with clean base.  Suggested holding anticoagulation for 2 weeks if thromboembolic event is low.  PPI twice daily and avoiding NSAIDs.  Does have outpatient follow-up for EGD 8 to 12 weeks to follow-up healing. 04/18/2023 Hgb 8.9, MCV 100.8, normal platelets no leukocytosis. 04/15/2023 BUN 23, creatinine 0.83 04/12/2023 iron 33, ferritin 227, percent saturation 15 Alk phos 112, AST 19, ALT 12, total bilirubin 1.2  Nephew was in the room, Aaron, gives a lot of the history.   Patient lives alone and Aaron comes over twice daily to help/check on him. Patient's nephew states he never got prescription for omeprazole and has not been taking it since his discharge.  Did stop Eliquis last hospitalization. Has been having brown stools daily since discharge, had stopped MiraLAX was actually having constipation/hard stools, restarted that yesterday. Then yesterday morning he had 1 large volume dark black tarry stool.  ( See picture in  media) This morning had streaks of bright red blood on the pad and with wiping the rectum but no large-volume bright red bleeding.   Has not had any rectal pain. Patient's had decreased p.o. intake and decreased appetite since being hospitalized, does have some mild upper epigastric discomfort with eating. Has had significant weight loss over the last 3 years per patient and nephew, has continued to have weight loss, unknown amount.  Patient's had some shortness of breath since previous hospitalization newly started on 2 L nasal cannula.  Denies chest pain, leg swelling, erythema. Denies alcohol use or NSAID use. He believes his last colonoscopy was about 10 years ago, may have had polyps.   No family history of colon cancer.  Patient 2 L nasal cannula normal saturation, was hypotensive at 90/40, tachycardic 110 in the ER Positive FOBT in the ER. BUN 60, creatinine 1.32, compared to 32 and 0.89 at discharge a week or so ago.  Hgb 7.1, MCV 102, WBC 11.5 INR 1.3  Abnormal ED labs: Abnormal Labs Reviewed  CBC WITH DIFFERENTIAL/PLATELET - Abnormal; Notable for the following components:      Result Value   WBC 11.5 (*)    RBC 2.27 (*)    Hemoglobin 7.1 (*)    HCT 23.2 (*)    MCV 102.2 (*)    Neutro Abs 8.4 (*)    All other components within normal limits  PROTIME-INR - Abnormal; Notable for the following components:   Prothrombin Time 15.9 (*)    INR 1.3 (*)    All other components within normal   limits  POC OCCULT BLOOD, ED - Abnormal; Notable for the following components:   Fecal Occult Bld POSITIVE (*)    All other components within normal limits  I-STAT CHEM 8, ED - Abnormal; Notable for the following components:   BUN 53 (*)    Creatinine, Ser 1.40 (*)    Glucose, Bld 129 (*)    Calcium, Ion 1.11 (*)    TCO2 20 (*)    Hemoglobin 7.1 (*)    HCT 21.0 (*)    All other components within normal limits     Past Medical History:  Diagnosis Date   Cellulitis 07/17/2020   LEFT  HIP   Diabetes mellitus    Dyspnea    Hyperlipidemia    Hypertension     Surgical History:  He  has a past surgical history that includes Spine surgery and Femur IM nail (Left, 03/27/2023). Family History:  His family history is not on file. Social History:   reports that he has been smoking cigarettes. He has a 20.00 pack-year smoking history. He has never used smokeless tobacco. He reports that he does not drink alcohol and does not use drugs.  Prior to Admission medications   Medication Sig Start Date End Date Taking? Authorizing Provider  acetaminophen (TYLENOL) 500 MG tablet Take 500 mg by mouth every 6 (six) hours as needed for mild pain or moderate pain.    [provider]  apixaban (ELIQUIS) 2.5 MG TABS tablet Take 1 tablet (2.5 mg total) by mouth 2 (two) times daily for 16 days. 04/01/23 04/17/23  Gomez-Caraballo, Maria, MD  gabapentin (NEURONTIN) 800 MG tablet Take 800 mg by mouth 3 (three) times daily.    [provider]  losartan (COZAAR) 25 MG tablet Take 25 mg by mouth daily.    [provider]  mirtazapine (REMERON SOL-TAB) 15 MG disintegrating tablet Take 1 tablet (15 mg total) by mouth at bedtime. 04/01/23 03/31/24  Gomez-Caraballo, Maria, MD  Oxycodone HCl 10 MG TABS Take 1 tablet (10 mg total) by mouth every 6 (six) hours as needed. 04/01/23 05/01/23  Gomez-Caraballo, Maria, MD  tiZANidine (ZANAFLEX) 4 MG capsule Take 4 mg by mouth 3 (three) times daily.    [provider]    Current Facility-Administered Medications  Medication Dose Route Frequency Provider Last Rate Last Admin   0.9 %  sodium chloride infusion (Manually program via Guardrails IV Fluids)   Intravenous Once Ford, Kelsey N, PA-C       0.9 %  sodium chloride infusion (Manually program via Guardrails IV Fluids)   Intravenous Once Long, Joshua G, MD       pantoprazole (PROTONIX) 80 mg /NS 100 mL IVPB  80 mg Intravenous Once Ford, Kelsey N, PA-C       [START ON 05/04/2023]  pantoprazole (PROTONIX) injection 40 mg  40 mg Intravenous Q12H Ford, Kelsey N, PA-C       pantoprozole (PROTONIX) 80 mg /NS 100 mL infusion  8 mg/hr Intravenous Continuous Ford, Kelsey N, PA-C       Current Outpatient Medications  Medication Sig Dispense Refill   acetaminophen (TYLENOL) 500 MG tablet Take 500 mg by mouth every 6 (six) hours as needed for mild pain or moderate pain.     apixaban (ELIQUIS) 2.5 MG TABS tablet Take 1 tablet (2.5 mg total) by mouth 2 (two) times daily for 16 days. 60 tablet    gabapentin (NEURONTIN) 800 MG tablet Take 800 mg by mouth 3 (three) times daily.       losartan (COZAAR) 25 MG tablet Take 25 mg by mouth daily.     mirtazapine (REMERON SOL-TAB) 15 MG disintegrating tablet Take 1 tablet (15 mg total) by mouth at bedtime. 90 tablet 3   Oxycodone HCl 10 MG TABS Take 1 tablet (10 mg total) by mouth every 6 (six) hours as needed. 120 tablet 0   tiZANidine (ZANAFLEX) 4 MG capsule Take 4 mg by mouth 3 (three) times daily.      Allergies as of 04/30/2023 - Review Complete 03/27/2023  Allergen Reaction Noted   Lipitor [atorvastatin calcium] Other (See Comments) 01/28/2012    Review of Systems:    Review of Systems  Constitutional:  Positive for malaise/fatigue and weight loss. Negative for chills, diaphoresis and fever.  Respiratory:  Positive for shortness of breath. Negative for cough, hemoptysis and wheezing.   Cardiovascular:  Negative for chest pain and leg swelling.  Gastrointestinal:  Positive for abdominal pain, blood in stool, constipation and melena. Negative for diarrhea, heartburn, nausea and vomiting.  Musculoskeletal:  Positive for joint pain and myalgias.  Neurological:  Positive for weakness. Negative for focal weakness.      Physical Exam:  Vital signs in last 24 hours: Weight:  [63.5 kg] 63.5 kg (05/09 1537)   Last BM recorded by nurses in past 5 days No data recorded  General: Chronically ill-appearing cachectic male in no acute  distress Head:  Normocephalic and atraumatic.  Poor dentition Eyes: sclerae anicteric,conjunctive pale  Heart: Slight tachycardia while in the room, hypotension Pulm: Clear anteriorly; on 2 L nasal cannula satting well Abdomen:  Soft, Non-distended AB, Active bowel sounds. mild tenderness in the epigastrium. Without guarding and Without rebound, No organomegaly appreciated. Extremities: No edema, no erythema, no swelling, nontender.  No signs of DVT. Msk: Patient laying on right side, left medial thigh with well-healed scar. Peripheral pulses intact.  Neurologic:  Alert and  oriented x4;  No focal deficits.  Skin: Dry and mucosa, Psychiatric:  Cooperative. Normal mood and affect.  LAB RESULTS: Recent Labs    04/30/23 1555 04/30/23 1614  WBC 11.5*  --   HGB 7.1* 7.1*  HCT 23.2* 21.0*  PLT 355  --    BMET Recent Labs    04/30/23 1614  NA 138  K 4.5  CL 108  GLUCOSE 129*  BUN 53*  CREATININE 1.40*   LFT No results for input(s): "PROT", "ALBUMIN", "AST", "ALT", "ALKPHOS", "BILITOT", "BILIDIR", "IBILI" in the last 72 hours. PT/INR Recent Labs    04/30/23 1555  LABPROT 15.9*  INR 1.3*    STUDIES: No results found.    Impression    Acute on chronic anemia with hypotension, tachycardia after large volume melanic stools yesterday, no further bleeding, recent PUD on admission 04/22, negative H pylori Patient's been off Eliquis since 4/24.   + FOBT in the ER, BUN elevated out of proportion to creatinine Patient never picked up  PPI from previous hospitalization for PUD Hgb 7.1 status post 1 unit running, pending 1 unit BUN 60 (23) Last colonoscopy over 10 years ago  AKI BUN 60, creatinine 1.32 ( baseline 0.89) Likely secondary to poor p.o. intake as well as acute blood loss anemia  Constipation Continue MiraLAX  Chronic respiratory failure On 2 L nasal cannula statting well  moderate protein calorie malnutrition Albumin 04/30/2023  2.3  BMI body mass index is  20.09 kg/m.  Secondary to Prolonged hospital stay - RD consult Count calories, increase protein  Hypercoagulopathy mild INR 1.3 Not on   Eliquis Question nutritional, normal LFTs this visit  Leukocytosis Afebrile likely hyper concentration, monitor for infection  Recent left distal femur fracture on 04/05 No evidence of DVT/PE at this time Hold anticoagulation secondary to GI bleed  Active Problems:   * No active hospital problems. *    LOS: 0 days     Plan   66-year-old male not on anticoagulation, presents with 1 episode large-volume melanic stools after recent diagnosis of PUD not on PPI.  With associated hypotension and tachycardia.  Has elevation of BUN pointing towards more upper GI bleed though patient did have minor bright red bleeding this seems more until paper possible hemorrhoidal.  -At this time agree with acute supportive care, stabilizing patient, currently getting 1 unit of blood and has 1 more pending. -Started on Protonix infusion -Monitor H&H every 4 hours x 4. -Monitor hemoglobin, transfuse less than 7. -Consider nutritional consult/RD consult -Will plan for endoscopy tomorrow with Dr. Pyrtle -If another large-volume bleeding, consider CTA, IR consult if positive and contact GI. - Clear liquid diet, NPO midnight -Most likely this represents upper GI bleed with history, physical exam, has not had a colonoscopy in over 10 years, uncertain if patient would even be able to undergo this at this time.  Can address later on in hospitalization versus outpatient.  [facility] does [facility] per nephew 05/19 (uncertain of exact date)  Thank you for your kind consultation, we will continue to follow.   Maayan Jenning R Imogine Carvell  04/30/2023, 4:56 PM  

## 2023-04-30 NOTE — ED Triage Notes (Signed)
Pt coming from home, yesterday care taker reports a dark tarry stool and today care taker reports bright red stool  cbg 167 O2 on 2L for comfort.  90/40 BP 110 HR

## 2023-04-30 NOTE — H&P (Signed)
Date: 04/30/2023               Patient Name:  [name] MRN: [id number]  DOB: 03-22-1956 Age / Sex: 67 y.o., male   PCP: Pcp, No         Medical Service: Internal Medicine Teaching Service         Attending Physician: Dr. Ginnie Smart, MD    First Contact: Neldon Labella, MD Pager: Lindley Magnus [phone]  Second Contact: Rudene Christians, DO Pager: Milinda Pointer [phone]       After Hours (After 5p/  First Contact Pager: [phone]  weekends / holidays): Second Contact Pager: [phone]   SUBJECTIVE   Chief Complaint: Bloody stools  History of Present Illness: Patient is a 67 year old male with hypertension, hyperlipidemia, diabetes, history of peptic ulcer disease, and recent admission for upper GI bleed who presented to the ED for evaluation of recurrence of bloody stools.  Recent admission with bleeding gastric/duodenal ulcer, discharged on April 25th, doing well until 3 days ago when he started eating less due to upset stomach. Yesterday, he developed dark stools and this worsened today.  Last bowel movement was this morning around 11 AM and nephew noted bright red blood upon wiping.  However, stools were reportedly dark yesterday.  Patient's nephew reports he stopped taking miralax 4 days ago and has not been eating for past 3 days due to pain in his stomach.  He is also now constipated again.  He restarted his MiraLAX 2 days ago and ended up having dark stools the following day.  Denies any known history of h pylori.  Denies recent etoh, quit 10 yrs ago but was a heavy drinker.  Denies any cancer history.  Reports he was prescribed protonix but wasn't given it at last discharge.  He also reports he feels like big pills get stuck when he's swallowing sometimes. Denies recent rapid weight loss.  Patient also reports he has been dizzy occasionally, esp when sits up, and also sweats during these episodes. Has always been intermittently dizzy when he sits up according to nephew.  Endorses some chills, but  denies fever.    He has a long history of leg cramps that he takes Tylenol for.  Weaned off oxycodone 4 days ago by nephew.  He states his Zanaflex does not help with his muscle spasms but says he needs a muscle relaxer because his legs are cramping.   Muscle spasms aren't new but seem worse with the bleed.    Nephew states the patient sits in diapers at home. Is typically soaked in urine when nephew comes to check on him.  He has multiple sacral wounds that his nephew tries to keep bandage as well as he can.  MEDS: Tylenol 650 q6h PRN Docusate 100 mg daily at bedtime Gabapentin 800 mg TID Losartan 25 mg daily Mirtazapine 15 mg daily at bedtime Omeprazole 20 mg daily* Miralax 17 g daily Tizanidine 4 mg TID  ED Course: Notable pallor, hypotensive, tachycardic with melanotic stools on exam.  Hemoglobin 7.1.  Started on Protonix, given 2 L IVF and 2 u pRBCs.  GI consulted.   Past Medical History:  Diagnosis Date   Cellulitis 07/17/2020   LEFT HIP   Diabetes mellitus    Dyspnea    Hyperlipidemia    Hypertension     Past Surgical History:  Procedure Laterality Date   FEMUR IM NAIL Left 03/27/2023   Procedure: INTRAMEDULLARY (IM) RETROGRADE FEMORAL NAILING;  Surgeon: Myrene Galas, MD;  Location: [facility] OR;  Service: Orthopedics;  Laterality: Left;   SPINE SURGERY      Social:  Lives alone at home, nephew Harlan Stains comes twice a day. Is bedbound at home. Smokes tobacco 1/2 PPD. Has smoked since he was a teenager.  Has good support via his nephew.  Bedbound since last discharge.  Does not have a PCP.  Family History: No pertinent family history  Allergies: Allergies as of 04/30/2023 - Review Complete 04/30/2023  Allergen Reaction Noted   Lipitor [atorvastatin calcium] Other (See Comments) 01/28/2012    Review of Systems: A complete ROS was negative except as per HPI.   OBJECTIVE:   Physical Exam: Blood pressure 113/66, pulse 85, temperature 98.5 F (36.9 C), temperature source  Oral, resp. rate 17, height 5\' 10"  (1.778 m), weight 63.5 kg, SpO2 100 %.  Constitutional: Chronically ill-appearing elderly male sitting in hospital bed, in no acute distress, mucosal pallor HENT: normocephalic atraumatic, dry mucous membranes Eyes: conjunctiva non-erythematous Neck: supple Cardiovascular: regular rate and rhythm, no m/r/g Pulmonary/Chest: normal work of breathing on room air, lungs clear to auscultation anteriorly Abdominal: soft, non-tender, non-distended MSK: normal bulk and tone, lower extremities without edema, cool to touch, peripheral pulses intact Neurological: alert & oriented x 3 Skin: warm and dry, multiple stage I and stage II pressure ulcers about the sacrum. Psych: Normal mood and affect  Labs: CBC    Component Value Date/Time   WBC 11.5 (H) 04/30/2023 1555   RBC 2.27 (L) 04/30/2023 1555   HGB 7.1 (L) 04/30/2023 1614   HCT 21.0 (L) 04/30/2023 1614   PLT 355 04/30/2023 1555   MCV 102.2 (H) 04/30/2023 1555   MCV 87.3 01/28/2012 1409   MCH 31.3 04/30/2023 1555   MCHC 30.6 04/30/2023 1555   RDW 14.3 04/30/2023 1555   LYMPHSABS 2.3 04/30/2023 1555   MONOABS 0.7 04/30/2023 1555   EOSABS 0.1 04/30/2023 1555   BASOSABS 0.1 04/30/2023 1555     CMP     Component Value Date/Time   NA 138 04/30/2023 1614   K 4.5 04/30/2023 1614   CL 108 04/30/2023 1614   CO2 20 (L) 04/30/2023 1555   GLUCOSE 129 (H) 04/30/2023 1614   BUN 53 (H) 04/30/2023 1614   CREATININE 1.40 (H) 04/30/2023 1614   CREATININE 1.09 01/28/2012 1406   CALCIUM 8.0 (L) 04/30/2023 1555   PROT 5.5 (L) 04/30/2023 1555   ALBUMIN 2.3 (L) 04/30/2023 1555   AST 17 04/30/2023 1555   ALT 11 04/30/2023 1555   ALKPHOS 81 04/30/2023 1555   BILITOT 0.5 04/30/2023 1555   GFRNONAA 60 (L) 04/30/2023 1555   GFRAA >60 09/12/2020 1500    Imaging: No results found.  ASSESSMENT & PLAN:   Assessment & Plan by Problem: Principal Problem:   Acute GI bleeding Active Problems:   Acute blood loss  anemia   Melena   History of gastric ulcer   [name] is a 67 year old male with hypertension, hyperlipidemia, diabetes, history of peptic ulcer disease, and recent admission for upper GI bleed who presented to the ED for evaluation of recurrence of bloody stools admitted for evaluation and treatment of presumed GI bleed.  # Acute on chronic anemia in the setting of melena # Recent admission for GI bleed likely 2/2 PUD Patient presents with 2-day history of bloody stools.  Recent hospitalization with EGD showing gastric and duodenal ulcers.  Endorses history of heavy alcohol use.  No history of H. pylori.  Hemoglobin today 7.1 with ED  provider reporting high-volume melena on initial exam. Received 2 units PRBCs.  Plan is to proceed with EGD tomorrow, or sooner if supportive measures inadequate this evening.  Repeat H&H improved to 9.1.  Of note, patient does report that he has not been taking Eliquis per physician's instruction. -Gastroenterology following, appreciate recommendations -N.p.o. at midnight, endoscopy tomorrow with Dr. Rhea Belton -Follow-up every 4 hour H&H, transfuse for Hgb <7 -IV Protonix -Clear liquid diet, n.p.o. at midnight  # AKI Patient's baseline sCr around 1.0 but elevated 1.40 today.  This is likely secondary to combination of poor p.o. intake over the last several days as well as GI blood losses. -Fluid resuscitation -Trend creatinine  #Malnutrition BMI 20.09.  Protein calorie malnutrition identified during last admission.  Patient started on low-dose mirtazapine 50 mg nightly.  Seems to be a chronic problem for this gentleman and depression may be contributing. -Continue mirtazapine 50 mg nightly -Consult dietitian  # Type 2 diabetes Patient presents with history of type 2 diabetes.  Last A1c 4.7.  Glucose 129 this admission.  He is not on medication.  His insulin resistance may have improved with weight loss. -Does not warrant treatment at this time.    #  Hypertension Patient presents with a history of hypertension.  His blood pressure this admission has ranged between normotensive and hypotensive.  Will hold antihypertensives for now. -Hold home losartan  #Sacral pressure ulcers Patient presents with multiple pressure ulcers about his sacral area.  Unsure as to how long these have been here though nephew does report that the patient has only been bedridden since recent discharge. -Consult wound care  Diet:  Clear liquid diet VTE: None IVF: NS,   Code: Full  Prior to Admission Living Arrangement: Home, living by himself Anticipated Discharge Location:  Pending Barriers to Discharge: Medical management  Dispo: Admit patient to Observation with expected length of stay less than 2 midnights.  Signed: Adron Bene, MD Internal Medicine Resident PGY-1  04/30/2023, 8:28 PM

## 2023-04-30 NOTE — ED Notes (Signed)
ED TO INPATIENT HANDOFF REPORT  ED Nurse Name and Phone #: [phone]  S Name/Age/Gender [name] 67 y.o. male Room/Bed: 005C/005C  Code Status   Code Status: Full Code  Home/SNF/Other Home Patient oriented to: self, place, time, and situation Is this baseline? Yes   Triage Complete: Triage complete  Chief Complaint Acute GI bleeding [K92.2]  Triage Note Pt coming from home, yesterday care taker reports a dark tarry stool and today care taker reports bright red stool  cbg 167 O2 on 2L for comfort.  90/40 BP 110 HR    Allergies Allergies  Allergen Reactions   Lipitor [Atorvastatin Calcium] Other (See Comments)    Myalgias     Level of Care/Admitting Diagnosis ED Disposition     ED Disposition  Admit   Condition  --   Comment  Hospital Area: [facility] [[id number]]  Level of Care: Med-Surg [16]  May admit patient to [facility] or [facility] if equivalent level of care is available:: No  Covid Evaluation: Asymptomatic - no recent exposure (last 10 days) testing not required  Diagnosis: Acute GI bleeding [960454]  Admitting Physician: Ginnie Smart [[id number]]  Attending Physician: Ninetta Lights, JEFFREY C [[id number]]  Certification:: I certify this patient will need inpatient services for at least 2 midnights  Estimated Length of Stay: 2          B Medical/Surgery History Past Medical History:  Diagnosis Date   Cellulitis 07/17/2020   LEFT HIP   Diabetes mellitus    Dyspnea    Hyperlipidemia    Hypertension    Past Surgical History:  Procedure Laterality Date   FEMUR IM NAIL Left 03/27/2023   Procedure: INTRAMEDULLARY (IM) RETROGRADE FEMORAL NAILING;  Surgeon: Myrene Galas, MD;  Location: [facility] OR;  Service: Orthopedics;  Laterality: Left;   SPINE SURGERY       A IV Location/Drains/Wounds Patient Lines/Drains/Airways Status     Active Line/Drains/Airways     Name Placement date Placement time Site Days   Peripheral IV 04/30/23 20 G  Anterior;Left;Proximal Forearm 04/30/23  1709  Forearm  less than 1   Peripheral IV 04/30/23 20 G Anterior;Right Forearm 04/30/23  1500  Forearm  less than 1   Wound / Incision (Open or Dehisced) 08/02/20 Sacrum Medial 08/02/20  1458  Sacrum  1001   Wound / Incision (Open or Dehisced) 08/03/20 Hip Left 08/03/20  1513  Hip  1000   Wound / Incision (Open or Dehisced) 09/07/20 (MASD) Moisture Associated Skin Damage Pelvis Anterior 09/07/20  0800  Pelvis  965            Intake/Output Last 24 hours No intake or output data in the 24 hours ending 04/30/23 2029  Labs/Imaging Results for orders placed or performed during the hospital encounter of 04/30/23 (from the past 48 hour(s))  Comprehensive metabolic panel     Status: Abnormal   Collection Time: 04/30/23  3:55 PM  Result Value Ref Range   Sodium 137 135 - 145 mmol/L   Potassium 4.6 3.5 - 5.1 mmol/L   Chloride 109 98 - 111 mmol/L   CO2 20 (L) 22 - 32 mmol/L   Glucose, Bld 131 (H) 70 - 99 mg/dL    Comment: Glucose reference range applies only to samples taken after fasting for at least 8 hours.   BUN 60 (H) 8 - 23 mg/dL   Creatinine, Ser 0.98 (H) 0.61 - 1.24 mg/dL   Calcium 8.0 (L) 8.9 - 10.3 mg/dL  Total Protein 5.5 (L) 6.5 - 8.1 g/dL   Albumin 2.3 (L) 3.5 - 5.0 g/dL   AST 17 15 - 41 U/L   ALT 11 0 - 44 U/L   Alkaline Phosphatase 81 38 - 126 U/L   Total Bilirubin 0.5 0.3 - 1.2 mg/dL   GFR, Estimated 60 (L) >60 mL/min    Comment: (NOTE) Calculated using the CKD-EPI Creatinine Equation (2021)    Anion gap 8 5 - 15    Comment: Performed at [facility], [address], [place], [place] [address]  CBC with Differential     Status: Abnormal   Collection Time: 04/30/23  3:55 PM  Result Value Ref Range   WBC 11.5 (H) 4.0 - 10.5 K/uL   RBC 2.27 (L) 4.22 - 5.81 MIL/uL   Hemoglobin 7.1 (L) 13.0 - 17.0 g/dL   HCT 96.2 (L) 95.2 - 84.1 %   MCV 102.2 (H) 80.0 - 100.0 fL   MCH 31.3 26.0 - 34.0 pg   MCHC 30.6 30.0 - 36.0 g/dL    RDW 32.4 40.1 - 02.7 %   Platelets 355 150 - 400 K/uL   nRBC 0.0 0.0 - 0.2 %   Neutrophils Relative % 72 %   Neutro Abs 8.4 (H) 1.7 - 7.7 K/uL   Lymphocytes Relative 20 %   Lymphs Abs 2.3 0.7 - 4.0 K/uL   Monocytes Relative 6 %   Monocytes Absolute 0.7 0.1 - 1.0 K/uL   Eosinophils Relative 1 %   Eosinophils Absolute 0.1 0.0 - 0.5 K/uL   Basophils Relative 1 %   Basophils Absolute 0.1 0.0 - 0.1 K/uL   Immature Granulocytes 0 %   Abs Immature Granulocytes 0.05 0.00 - 0.07 K/uL    Comment: Performed at [facility], [address] [place], [place] [address]  Protime-INR     Status: Abnormal   Collection Time: 04/30/23  3:55 PM  Result Value Ref Range   Prothrombin Time 15.9 (H) 11.4 - 15.2 seconds   INR 1.3 (H) 0.8 - 1.2    Comment: (NOTE) INR goal varies based on device and disease states. Performed at [facility], [address], [place], [place] [address]   Type and screen [facility]     Status: None (Preliminary result)   Collection Time: 04/30/23  3:55 PM  Result Value Ref Range   ABO/RH(D) B POS    Antibody Screen NEG    Sample Expiration      05/03/2023,2359 Performed at [facility], [address] [place], [place] [address]    Unit Number [id number]    Blood Component Type RED CELLS,LR    Unit division 00    Status of Unit ISSUED    Transfusion Status OK TO TRANSFUSE    Crossmatch Result COMPATIBLE    Unit Number [id number]    Blood Component Type RED CELLS,LR    Unit division 00    Status of Unit ISSUED    Unit tag comment EMERGENCY RELEASE    Transfusion Status OK TO TRANSFUSE    Crossmatch Result COMPATIBLE   Prepare RBC     Status: None   Collection Time: 04/30/23  4:03 PM  Result Value Ref Range   Order Confirmation      ORDER PROCESSED BY BLOOD BANK Performed at [facility], [address], [place], [place] [address]   POC occult blood, ED     Status: Abnormal  Collection Time: 04/30/23  4:12 PM   Result Value Ref Range   Fecal Occult Bld POSITIVE (A) NEGATIVE  I-stat chem 8, ED     Status: Abnormal   Collection Time: 04/30/23  4:14 PM  Result Value Ref Range   Sodium 138 135 - 145 mmol/L   Potassium 4.5 3.5 - 5.1 mmol/L   Chloride 108 98 - 111 mmol/L   BUN 53 (H) 8 - 23 mg/dL   Creatinine, Ser 1.61 (H) 0.61 - 1.24 mg/dL   Glucose, Bld 096 (H) 70 - 99 mg/dL    Comment: Glucose reference range applies only to samples taken after fasting for at least 8 hours.   Calcium, Ion 1.11 (L) 1.15 - 1.40 mmol/L   TCO2 20 (L) 22 - 32 mmol/L   Hemoglobin 7.1 (L) 13.0 - 17.0 g/dL   HCT 04.5 (L) 40.9 - 81.1 %   No results found.  Pending Labs Unresulted Labs (From admission, onward)     Start     Ordered   05/01/23 0500  Basic metabolic panel  Tomorrow morning,   R        04/30/23 2009   05/01/23 0500  CBC  Tomorrow morning,   R        04/30/23 2009   04/30/23 1714  Hemoglobin and hematocrit, blood  Now then every 6 hours,   R      04/30/23 1713            Vitals/Pain Today's Vitals   04/30/23 1700 04/30/23 1715 04/30/23 1730 04/30/23 1845  BP: 106/62 105/61 (!) 106/58 113/66  Pulse:    85  Resp: (!) 21 20 19 17   Temp:    98.5 F (36.9 C)  TempSrc:    Oral  SpO2:    100%  Weight:      Height:        Isolation Precautions No active isolations  Medications Medications  0.9 %  sodium chloride infusion (Manually program via Guardrails IV Fluids) (has no administration in time range)  pantoprozole (PROTONIX) 80 mg /NS 100 mL infusion (8 mg/hr Intravenous New Bag/Given 04/30/23 1709)  pantoprazole (PROTONIX) injection 40 mg (has no administration in time range)  0.9 %  sodium chloride infusion (Manually program via Guardrails IV Fluids) (has no administration in time range)  polyethylene glycol (MIRALAX / GLYCOLAX) packet 17 g (has no administration in time range)  mirtazapine (REMERON SOL-TAB) disintegrating tablet 15 mg (has no administration in time range)  tiZANidine  (ZANAFLEX) tablet 4 mg (has no administration in time range)  gabapentin (NEURONTIN) capsule 800 mg (has no administration in time range)  pantoprazole (PROTONIX) 80 mg /NS 100 mL IVPB (0 mg Intravenous Stopped 04/30/23 1937)    Mobility non-ambulatory     Focused Assessments    R Recommendations: See Admitting Provider Note  Report given to:   Additional Notes:

## 2023-04-30 NOTE — ED Provider Notes (Signed)
[place] EMERGENCY DEPARTMENT AT [facility] Provider Note   CSN: [id number] Arrival date & time: 04/30/23  1534     History  Chief Complaint  Patient presents with   GI Problem    Bleed    LAJOHN Kane is a 67 y.o. male.  [name] is a 67 y.o. male with recent admission for upper GI bleed as well as history of hypertension, hyperlipidemia and diabetes, who presents for evaluation of recurrence of bloody stools.  Started having dark black stools 2 days ago, today family noted some dark red stools as well and increased amount of bowel movements.  He denies abdominal pain, no fevers or chills.  He is bedbound at baseline, no syncopal episodes.  Not on any anticoagulation.  Recently admitted at [facility] for upper GI bleed and endoscopy showed 3 clean-based nonbleeding ulcers at that time.  The history is provided by the patient, a relative and medical records.       Home Medications Prior to Admission medications   Medication Sig Start Date End Date Taking? Authorizing Provider  acetaminophen (TYLENOL) 325 MG tablet Take 650 mg by mouth every 6 (six) hours as needed for moderate pain, fever or headache.   Yes [provider]  docusate sodium (COLACE) 100 MG capsule Take 100 mg by mouth at bedtime.   Yes [provider]  gabapentin (NEURONTIN) 800 MG tablet Take 800 mg by mouth 3 (three) times daily.   Yes [provider]  losartan (COZAAR) 25 MG tablet Take 25 mg by mouth daily.   Yes [provider]  mirtazapine (REMERON SOL-TAB) 15 MG disintegrating tablet Take 1 tablet (15 mg total) by mouth at bedtime. 04/01/23 03/31/24 Yes Walter Crocker, MD  Oxycodone HCl 10 MG TABS Take 1 tablet (10 mg total) by mouth every 6 (six) hours as needed. Patient taking differently: Take 10 mg by mouth every 6 (six) hours as needed (severe pain). 04/01/23 05/01/23 Yes Walter Crocker, MD  polyethylene glycol powder  (GLYCOLAX/MIRALAX) 17 GM/SCOOP powder Take 17 g by mouth daily.   Yes [provider]  tiZANidine (ZANAFLEX) 4 MG tablet Take 4 mg by mouth 3 (three) times daily.   Yes [provider]  apixaban (ELIQUIS) 2.5 MG TABS tablet Take 1 tablet (2.5 mg total) by mouth 2 (two) times daily for 16 days. Patient not taking: Reported on 04/30/2023 04/01/23 04/17/23  Walter Crocker, MD      Allergies    Lipitor [atorvastatin calcium]    Review of Systems   Review of Systems  Unable to perform ROS: Acuity of condition    Physical Exam Updated Vital Signs BP 113/66   Pulse 85   Resp 17   Ht 5\' 10"  (1.778 m)   Wt 63.5 kg   SpO2 100%   BMI 20.09 kg/m  Physical Exam Vitals and nursing note reviewed.  Constitutional:      General: He is not in acute distress.    Appearance: He is well-developed. He is ill-appearing. He is not diaphoretic.     Comments: Patient is acutely ill-appearing and pale  HENT:     Head: Normocephalic and atraumatic.     Mouth/Throat:     Comments: Mucous membranes pale Eyes:     General:        Right eye: No discharge.        Left eye: No discharge.     Comments: Conjunctive a pale  Cardiovascular:  Rate and Rhythm: Regular rhythm. Tachycardia present.     Pulses: Normal pulses.     Heart sounds: Normal heart sounds.  Pulmonary:     Effort: Pulmonary effort is normal. No respiratory distress.     Breath sounds: Normal breath sounds. No wheezing or rales.     Comments: Respirations equal and unlabored, on nasal cannula, mildly tachypneic.  Breath sounds clear and equal bilaterally Abdominal:     General: Bowel sounds are normal. There is no distension.     Palpations: Abdomen is soft. There is no mass.     Tenderness: There is no abdominal tenderness. There is no guarding.     Comments: Abdomen soft, nondistended, nontender to palpation in all quadrants without guarding or peritoneal signs  Genitourinary:    Comments: Chaperone present  during rectal exam.  Large amount of melena noted Musculoskeletal:        General: No deformity.     Cervical back: Neck supple.  Skin:    General: Skin is warm and dry.     Capillary Refill: Capillary refill takes less than 2 seconds.  Neurological:     Mental Status: He is alert and oriented to person, place, and time.     Coordination: Coordination normal.     Comments: Speech is clear, able to follow commands  Psychiatric:        Behavior: Behavior normal.     ED Results / Procedures / Treatments   Labs (all labs ordered are listed, but only abnormal results are displayed) Labs Reviewed  COMPREHENSIVE METABOLIC PANEL - Abnormal; Notable for the following components:      Result Value   CO2 20 (*)    Glucose, Bld 131 (*)    BUN 60 (*)    Creatinine, Ser 1.32 (*)    Calcium 8.0 (*)    Total Protein 5.5 (*)    Albumin 2.3 (*)    GFR, Estimated 60 (*)    All other components within normal limits  CBC WITH DIFFERENTIAL/PLATELET - Abnormal; Notable for the following components:   WBC 11.5 (*)    RBC 2.27 (*)    Hemoglobin 7.1 (*)    HCT 23.2 (*)    MCV 102.2 (*)    Neutro Abs 8.4 (*)    All other components within normal limits  PROTIME-INR - Abnormal; Notable for the following components:   Prothrombin Time 15.9 (*)    INR 1.3 (*)    All other components within normal limits  POC OCCULT BLOOD, ED - Abnormal; Notable for the following components:   Fecal Occult Bld POSITIVE (*)    All other components within normal limits  I-STAT CHEM 8, ED - Abnormal; Notable for the following components:   BUN 53 (*)    Creatinine, Ser 1.40 (*)    Glucose, Bld 129 (*)    Calcium, Ion 1.11 (*)    TCO2 20 (*)    Hemoglobin 7.1 (*)    HCT 21.0 (*)    All other components within normal limits  HEMOGLOBIN AND HEMATOCRIT, BLOOD  HEMOGLOBIN AND HEMATOCRIT, BLOOD  HEMOGLOBIN AND HEMATOCRIT, BLOOD  TYPE AND SCREEN  PREPARE RBC (CROSSMATCH)    EKG None  Radiology No results  found.  Procedures .Critical Care  Performed by: Dartha Lodge, PA-C Authorized by: Dartha Lodge, PA-C   Critical care provider statement:    Critical care time (minutes):  45   Critical care was necessary to treat or prevent imminent  or life-threatening deterioration of the following conditions:  Circulatory failure (GI bleed requiring transfusion)   Critical care was time spent personally by me on the following activities:  Development of treatment plan with patient or surrogate, discussions with consultants, evaluation of patient's response to treatment, examination of patient, ordering and review of laboratory studies, ordering and review of radiographic studies, ordering and performing treatments and interventions, pulse oximetry, re-evaluation of patient's condition and review of old charts   I assumed direction of critical care for this patient from another provider in my specialty: no     Care discussed with: admitting provider       Medications Ordered in ED Medications  0.9 %  sodium chloride infusion (Manually program via Guardrails IV Fluids) (has no administration in time range)  pantoprozole (PROTONIX) 80 mg /NS 100 mL infusion (8 mg/hr Intravenous New Bag/Given 04/30/23 1709)  pantoprazole (PROTONIX) injection 40 mg (has no administration in time range)  0.9 %  sodium chloride infusion (Manually program via Guardrails IV Fluids) (has no administration in time range)  polyethylene glycol (MIRALAX / GLYCOLAX) packet 17 g (has no administration in time range)  pantoprazole (PROTONIX) 80 mg /NS 100 mL IVPB (0 mg Intravenous Incomplete 04/30/23 1713)    ED Course/ Medical Decision Making/ A&P                             Medical Decision Making  67 y.o. male presents to the ED with complaints of blood in stools, this involves an extensive number of treatment options, and is a complaint that carries with it a high risk of complications and morbidity.  The differential diagnosis  includes recurrent upper GI bleed, lower GI bleed or diverticular bleed.  On arrival patient is pale and ill-appearing, noted to be hypotensive with systolic blood pressure in the 80s and mildly tachycardic.  Patient with melanotic stools on exam, abdomen is soft and nontender.  Additional history obtained from nephew at bedside. Previous records obtained and reviewed include being records from recent hospitalization at [facility] for upper GI bleed, endoscopy report with multiple clean-based nonbleeding gastric and duodenal ulcers, no evidence of varices.  Hemoglobin at most recent follow-up after hospital admission was 8.9, patient is hemodynamically unstable with clear evidence of GI bleeding, patient is very ill-appearing, 2 units of emergency release of broad transfused on [place] rapid transfuser and patient given 1 L of fluids.  Also started on Protonix bolus and infusion.  Lab Tests:  I Ordered, reviewed, and interpreted labs, which included: Hemoccult is positive.  I-STAT hemoglobin of 7.1 although I suspect has not yet caught up to his blood loss that has dropped almost 2 full points since recent follow-up labs.  BUN/creatinine ratio of 45 concerning for upper GI bleed, no other significant electrolyte derangements.    ED Course:   Consult placed to GI, case discussed with PA Willette Cluster, GI will be down to see patient to determine plan for scope shortly.  Agrees with management plan thus far.  After fluid bolus and 2 units of blood patient's blood pressure has stabilized remaining stable in the 100s, will consult for medicine admission to stepdown unit.  GI available for more immediate reconsultation if patient has another large-volume bloody bowel movement or vitals destabilize, but otherwise plans for endoscopy in the morning.  Case discussed with internal medicine teaching service who will see and admit the patient.  Portions of this  note were generated with  Scientist, clinical (histocompatibility and immunogenetics). Dictation errors may occur despite best attempts at proofreading.         Final Clinical Impression(s) / ED Diagnoses Final diagnoses:  Acute GI bleeding  Acute blood loss anemia    Rx / DC Orders ED Discharge Orders     None         Legrand Rams 04/30/23 1912    Maia Plan, MD 05/02/23 [phone]

## 2023-04-30 NOTE — Consult Note (Addendum)
Consultation  Referring Provider:   ER Primary Care Physician:  Patient, No Pcp Per Primary Gastroenterologist:  [facility]       Reason for Consultation: Acute GI bleed in setting of known peptic ulcer disease, hypotension/tachycardia             HPI:   [name] is a 67 y.o. male with past medical history significant for hypertension, diabetes, hyperlipidemia, chronic pain syndrome, anemia, peptic ulcer disease, elevated LFTs, constipation presents to the ER with melena.  Status post retrograde nail of left distal femur fracture on 4/5, placed on Eliquis 2.5 mg twice daily and discharged to SNF for rehab.  Admitted to [facility] 4/[facility] from SNF rehab secondary anemia and melanic stools. Hgb 13.6 on 09/01/2022 during hospital admission but 7.6 and FOBT positive..CT abdomen pelvis with enteritis 04/13/2023 EGD with Schatzki ring, small hiatal hernia, single ulcer lesser curvature of the stomach clean base and single ulcer in duodenal bulb with clean base.  Suggested holding anticoagulation for 2 weeks if thromboembolic event is low.  PPI twice daily and avoiding NSAIDs.  Does have outpatient follow-up for EGD 8 to 12 weeks to follow-up healing. 04/18/2023 Hgb 8.9, MCV 100.8, normal platelets no leukocytosis. 04/15/2023 BUN 23, creatinine 0.83 04/12/2023 iron 33, ferritin 227, percent saturation 15 Alk phos 112, AST 19, ALT 12, total bilirubin 1.2  Nephew was in the room, Clifton Custard, gives a lot of the history.   Patient lives alone and Clifton Custard comes over twice daily to help/check on him. Patient's nephew states he never got prescription for omeprazole and has not been taking it since his discharge.  Did stop Eliquis last hospitalization. Has been having brown stools daily since discharge, had stopped MiraLAX was actually having constipation/hard stools, restarted that yesterday. Then yesterday morning he had 1 large volume dark black tarry stool.  ( See picture in  media) This morning had streaks of bright red blood on the pad and with wiping the rectum but no large-volume bright red bleeding.   Has not had any rectal pain. Patient's had decreased p.o. intake and decreased appetite since being hospitalized, does have some mild upper epigastric discomfort with eating. Has had significant weight loss over the last 3 years per patient and nephew, has continued to have weight loss, unknown amount.  Patient's had some shortness of breath since previous hospitalization newly started on 2 L nasal cannula.  Denies chest pain, leg swelling, erythema. Denies alcohol use or NSAID use. He believes his last colonoscopy was about 10 years ago, may have had polyps.   No family history of colon cancer.  Patient 2 L nasal cannula normal saturation, was hypotensive at 90/40, tachycardic 110 in the ER Positive FOBT in the ER. BUN 60, creatinine 1.32, compared to 32 and 0.89 at discharge a week or so ago.  Hgb 7.1, MCV 102, WBC 11.5 INR 1.3  Abnormal ED labs: Abnormal Labs Reviewed  CBC WITH DIFFERENTIAL/PLATELET - Abnormal; Notable for the following components:      Result Value   WBC 11.5 (*)    RBC 2.27 (*)    Hemoglobin 7.1 (*)    HCT 23.2 (*)    MCV 102.2 (*)    Neutro Abs 8.4 (*)    All other components within normal limits  PROTIME-INR - Abnormal; Notable for the following components:   Prothrombin Time 15.9 (*)    INR 1.3 (*)    All other components within normal  limits  POC OCCULT BLOOD, ED - Abnormal; Notable for the following components:   Fecal Occult Bld POSITIVE (*)    All other components within normal limits  I-STAT CHEM 8, ED - Abnormal; Notable for the following components:   BUN 53 (*)    Creatinine, Ser 1.40 (*)    Glucose, Bld 129 (*)    Calcium, Ion 1.11 (*)    TCO2 20 (*)    Hemoglobin 7.1 (*)    HCT 21.0 (*)    All other components within normal limits     Past Medical History:  Diagnosis Date   Cellulitis 07/17/2020   LEFT  HIP   Diabetes mellitus    Dyspnea    Hyperlipidemia    Hypertension     Surgical History:  He  has a past surgical history that includes Spine surgery and Femur IM nail (Left, 03/27/2023). Family History:  His family history is not on file. Social History:   reports that he has been smoking cigarettes. He has a 20.00 pack-year smoking history. He has never used smokeless tobacco. He reports that he does not drink alcohol and does not use drugs.  Prior to Admission medications   Medication Sig Start Date End Date Taking? Authorizing Provider  acetaminophen (TYLENOL) 500 MG tablet Take 500 mg by mouth every 6 (six) hours as needed for mild pain or moderate pain.    [provider]  apixaban (ELIQUIS) 2.5 MG TABS tablet Take 1 tablet (2.5 mg total) by mouth 2 (two) times daily for 16 days. 04/01/23 04/17/23  Morene Crocker, MD  gabapentin (NEURONTIN) 800 MG tablet Take 800 mg by mouth 3 (three) times daily.    [provider]  losartan (COZAAR) 25 MG tablet Take 25 mg by mouth daily.    [provider]  mirtazapine (REMERON SOL-TAB) 15 MG disintegrating tablet Take 1 tablet (15 mg total) by mouth at bedtime. 04/01/23 03/31/24  Morene Crocker, MD  Oxycodone HCl 10 MG TABS Take 1 tablet (10 mg total) by mouth every 6 (six) hours as needed. 04/01/23 05/01/23  Morene Crocker, MD  tiZANidine (ZANAFLEX) 4 MG capsule Take 4 mg by mouth 3 (three) times daily.    [provider]    Current Facility-Administered Medications  Medication Dose Route Frequency Provider Last Rate Last Admin   0.9 %  sodium chloride infusion (Manually program via Guardrails IV Fluids)   Intravenous Once Ford, Kelsey N, PA-C       0.9 %  sodium chloride infusion (Manually program via Guardrails IV Fluids)   Intravenous Once Long, Arlyss Repress, MD       pantoprazole (PROTONIX) 80 mg /NS 100 mL IVPB  80 mg Intravenous Once Ford, Kelsey N, [place] ON 05/04/2023]  pantoprazole (PROTONIX) injection 40 mg  40 mg Intravenous Q12H Ford, Kelsey N, PA-C       pantoprozole (PROTONIX) 80 mg /NS 100 mL infusion  8 mg/hr Intravenous Continuous Dartha Lodge, [place]       Current Outpatient Medications  Medication Sig Dispense Refill   acetaminophen (TYLENOL) 500 MG tablet Take 500 mg by mouth every 6 (six) hours as needed for mild pain or moderate pain.     apixaban (ELIQUIS) 2.5 MG TABS tablet Take 1 tablet (2.5 mg total) by mouth 2 (two) times daily for 16 days. 60 tablet    gabapentin (NEURONTIN) 800 MG tablet Take 800 mg by mouth 3 (three) times daily.  losartan (COZAAR) 25 MG tablet Take 25 mg by mouth daily.     mirtazapine (REMERON SOL-TAB) 15 MG disintegrating tablet Take 1 tablet (15 mg total) by mouth at bedtime. 90 tablet 3   Oxycodone HCl 10 MG TABS Take 1 tablet (10 mg total) by mouth every 6 (six) hours as needed. 120 tablet 0   tiZANidine (ZANAFLEX) 4 MG capsule Take 4 mg by mouth 3 (three) times daily.      Allergies as of 04/30/2023 - Review Complete 03/27/2023  Allergen Reaction Noted   Lipitor [atorvastatin calcium] Other (See Comments) 01/28/2012    Review of Systems:    Review of Systems  Constitutional:  Positive for malaise/fatigue and weight loss. Negative for chills, diaphoresis and fever.  Respiratory:  Positive for shortness of breath. Negative for cough, hemoptysis and wheezing.   Cardiovascular:  Negative for chest pain and leg swelling.  Gastrointestinal:  Positive for abdominal pain, blood in stool, constipation and melena. Negative for diarrhea, heartburn, nausea and vomiting.  Musculoskeletal:  Positive for joint pain and myalgias.  Neurological:  Positive for weakness. Negative for focal weakness.      Physical Exam:  Vital signs in last 24 hours: Weight:  [63.5 kg] 63.5 kg (05/09 1537)   Last BM recorded by nurses in past 5 days No data recorded  General: Chronically ill-appearing cachectic male in no acute  distress Head:  Normocephalic and atraumatic.  Poor dentition Eyes: sclerae anicteric,conjunctive pale  Heart: Slight tachycardia while in the room, hypotension Pulm: Clear anteriorly; on 2 L nasal cannula satting well Abdomen:  Soft, Non-distended AB, Active bowel sounds. mild tenderness in the epigastrium. Without guarding and Without rebound, No organomegaly appreciated. Extremities: No edema, no erythema, no swelling, nontender.  No signs of DVT. Msk: Patient laying on right side, left medial thigh with well-healed scar. Peripheral pulses intact.  Neurologic:  Alert and  oriented x4;  No focal deficits.  Skin: Dry and mucosa, Psychiatric:  Cooperative. Normal mood and affect.  LAB RESULTS: Recent Labs    04/30/23 1555 04/30/23 1614  WBC 11.5*  --   HGB 7.1* 7.1*  HCT 23.2* 21.0*  PLT 355  --    BMET Recent Labs    04/30/23 1614  NA 138  K 4.5  CL 108  GLUCOSE 129*  BUN 53*  CREATININE 1.40*   LFT No results for input(s): "PROT", "ALBUMIN", "AST", "ALT", "ALKPHOS", "BILITOT", "BILIDIR", "IBILI" in the last 72 hours. PT/INR Recent Labs    04/30/23 1555  LABPROT 15.9*  INR 1.3*    STUDIES: No results found.    Impression    Acute on chronic anemia with hypotension, tachycardia after large volume melanic stools yesterday, no further bleeding, recent PUD on admission 04/22, negative H pylori Patient's been off Eliquis since 4/24.   + FOBT in the ER, BUN elevated out of proportion to creatinine Patient never picked up  PPI from previous hospitalization for PUD Hgb 7.1 status post 1 unit running, pending 1 unit BUN 60 (23) Last colonoscopy over 10 years ago  AKI BUN 60, creatinine 1.32 ( baseline 0.89) Likely secondary to poor p.o. intake as well as acute blood loss anemia  Constipation Continue MiraLAX  Chronic respiratory failure On 2 L nasal cannula statting well  moderate protein calorie malnutrition Albumin 04/30/2023  2.3  BMI body mass index is  20.09 kg/m.  Secondary to Prolonged hospital stay - RD consult Count calories, increase protein  Hypercoagulopathy mild INR 1.3 Not on  Eliquis Question nutritional, normal LFTs this visit  Leukocytosis Afebrile likely hyper concentration, monitor for infection  Recent left distal femur fracture on 04/05 No evidence of DVT/PE at this time Hold anticoagulation secondary to GI bleed  Active Problems:   * No active hospital problems. *    LOS: 0 days     Plan   67 year old male not on anticoagulation, presents with 1 episode large-volume melanic stools after recent diagnosis of PUD not on PPI.  With associated hypotension and tachycardia.  Has elevation of BUN pointing towards more upper GI bleed though patient did have minor bright red bleeding this seems more until paper possible hemorrhoidal.  -At this time agree with acute supportive care, stabilizing patient, currently getting 1 unit of blood and has 1 more pending. -Started on Protonix infusion -Monitor H&H every 4 hours x 4. -Monitor hemoglobin, transfuse less than 7. -Consider nutritional consult/RD consult -Will plan for endoscopy tomorrow with Dr. Rhea Belton -If another large-volume bleeding, consider CTA, IR consult if positive and contact GI. - Clear liquid diet, NPO midnight -Most likely this represents upper GI bleed with history, physical exam, has not had a colonoscopy in over 10 years, uncertain if patient would even be able to undergo this at this time.  Can address later on in hospitalization versus outpatient.  [facility] nephew [facility]/19 (uncertain of exact date)  Thank you for your kind consultation, we will continue to follow.   Doree Albee  04/30/2023, 4:56 PM

## 2023-04-30 NOTE — Hospital Course (Addendum)
Recent GI bleed, gastric/duodenal ulcer found during last hospitalization, previously non-bleeding, discharged on 25th, doing okay until last 3d, melena, worse today (large volume). 2u blood, fluid, BP improved, no BM in ED. Plan to scope in the AM. On protonix infusion. Previously on eliquis, but stopped at last hospitalization).   Had blood in his BM that started yesterday.  Says he needs a muscle relaxer because his legs are cramping. Yesterday blood was dark, today was bright red. This was how it was last time too.  Hasn't been eating for past 4 days. Due to pain in his stomach. Stopped taking miralax over last 4 days. When he stopped taking it he stopped pooping. Hadn't had any BM as a result. Took it day before yesterday; then that night, he had BM and that was black. No known history of h pylori. No recent etoh, quit 10 yrs ago. Was heavy drinker. No cancer history. Was prescribed protonix but wasn't given it at last discharge Has been dizzy, esp when sits up, and sweats. Has always been dizzy when he sits up though.  Some chills no fever.  Just tylenol for leg, weaned off oxy from 4 days ago.  No recent rapid weight loss Muscle spasms aren't new but seems worse with the bleed. Legs are only places that spasm. Happened since before broke the leg. Feels like big pills get stuck when he's swallowing sometimes. Was eating fine when he first went home. BM today was streaked with bright red when he was wiped. Dark was yesterday. Sits in diapers at home. Is typically soaked when nephew comes to check on him.  PMH: T2DM HTN  MEDS: Tylenol 650 q6h PRN Docusate 100 mg daily at bedtime Gabapentin 800 mg TID Losartan 25 mg daily Mirtazapine 15 mg daily at bedtime Omeprazole 20 mg daily* Miralax 17 g daily Tizanidine 4 mg TID  Lives alone at home, nephew aaran comes twice a day. Is bedbound at home. Smokes tobacco 1/2 PPD. Has smoked since he was a teenager.  5/10: Leg still hurts,  mentioned he hasn't gotten muscle relaxant. Pain is mostly from muscle spasms in legs and back.   GI recs: - Continue present medications. BID PPI x 12 weeks ( at least) . - Surveillance EGD to check healing of gastric ulcer particularly in 12 weeks is recommended. - Await pathology results. - No aspirin, ibuprofen, naproxen, or other non- steroidal anti- inflammatory drugs. - Hold DOAC given esophagitis and gastric + duodenal ulcers. - Liquid sucralfate TIDAC and HS can be used also for 1- 2 weeks.

## 2023-05-01 ENCOUNTER — Inpatient Hospital Stay (HOSPITAL_COMMUNITY): Payer: 59 | Admitting: Anesthesiology

## 2023-05-01 ENCOUNTER — Encounter (HOSPITAL_COMMUNITY)
Admission: EM | Disposition: A | Discharge status: Home Health | Payer: Self-pay | Source: Home / Self Care | Attending: Internal Medicine

## 2023-05-01 ENCOUNTER — Encounter (HOSPITAL_COMMUNITY): Payer: Self-pay | Admitting: Infectious Diseases

## 2023-05-01 ENCOUNTER — Inpatient Hospital Stay (HOSPITAL_COMMUNITY): Payer: 59

## 2023-05-01 DIAGNOSIS — K449 Diaphragmatic hernia without obstruction or gangrene: Secondary | ICD-10-CM | POA: Diagnosis not present

## 2023-05-01 DIAGNOSIS — L89150 Pressure ulcer of sacral region, unstageable: Secondary | ICD-10-CM

## 2023-05-01 DIAGNOSIS — K2100 Gastro-esophageal reflux disease with esophagitis, without bleeding: Secondary | ICD-10-CM

## 2023-05-01 DIAGNOSIS — K922 Gastrointestinal hemorrhage, unspecified: Secondary | ICD-10-CM | POA: Diagnosis not present

## 2023-05-01 DIAGNOSIS — N179 Acute kidney failure, unspecified: Secondary | ICD-10-CM

## 2023-05-01 DIAGNOSIS — I10 Essential (primary) hypertension: Secondary | ICD-10-CM

## 2023-05-01 DIAGNOSIS — K269 Duodenal ulcer, unspecified as acute or chronic, without hemorrhage or perforation: Secondary | ICD-10-CM | POA: Diagnosis not present

## 2023-05-01 DIAGNOSIS — K259 Gastric ulcer, unspecified as acute or chronic, without hemorrhage or perforation: Secondary | ICD-10-CM

## 2023-05-01 DIAGNOSIS — K209 Esophagitis, unspecified without bleeding: Secondary | ICD-10-CM

## 2023-05-01 DIAGNOSIS — M62838 Other muscle spasm: Secondary | ICD-10-CM | POA: Diagnosis not present

## 2023-05-01 DIAGNOSIS — L89152 Pressure ulcer of sacral region, stage 2: Secondary | ICD-10-CM

## 2023-05-01 DIAGNOSIS — E46 Unspecified protein-calorie malnutrition: Secondary | ICD-10-CM | POA: Diagnosis not present

## 2023-05-01 DIAGNOSIS — E119 Type 2 diabetes mellitus without complications: Secondary | ICD-10-CM

## 2023-05-01 DIAGNOSIS — F17200 Nicotine dependence, unspecified, uncomplicated: Secondary | ICD-10-CM

## 2023-05-01 HISTORY — PX: ESOPHAGOGASTRODUODENOSCOPY (EGD) WITH PROPOFOL: SHX5813

## 2023-05-01 HISTORY — PX: BIOPSY: SHX5522

## 2023-05-01 LAB — BPAM RBC
Blood Product Expiration Date: 202405272359
Blood Product Expiration Date: 202406022359
ISSUE DATE / TIME: 202405091611
ISSUE DATE / TIME: 202405091633
Unit Type and Rh: 5100

## 2023-05-01 LAB — BASIC METABOLIC PANEL
Anion gap: 11 (ref 5–15)
BUN: 46 mg/dL — ABNORMAL HIGH (ref 8–23)
CO2: 20 mmol/L — ABNORMAL LOW (ref 22–32)
Calcium: 8.1 mg/dL — ABNORMAL LOW (ref 8.9–10.3)
Chloride: 107 mmol/L (ref 98–111)
Creatinine, Ser: 1.1 mg/dL (ref 0.61–1.24)
GFR, Estimated: >60 mL/min (ref >60)
Glucose, Bld: 80 mg/dL (ref 70–99)
Potassium: 4.1 mmol/L (ref 3.5–5.1)
Sodium: 138 mmol/L (ref 135–145)

## 2023-05-01 LAB — CBC
HCT: 27.2 % — ABNORMAL LOW (ref 39.0–52.0)
Hemoglobin: 8.7 g/dL — ABNORMAL LOW (ref 13.0–17.0)
MCH: 31 pg (ref 26.0–34.0)
MCHC: 32 g/dL (ref 30.0–36.0)
MCV: 96.8 fL (ref 80.0–100.0)
Platelets: 257 10*3/uL (ref 150–400)
RBC: 2.81 MIL/uL — ABNORMAL LOW (ref 4.22–5.81)
RDW: 17.5 % — ABNORMAL HIGH (ref 11.5–15.5)
WBC: 13 10*3/uL — ABNORMAL HIGH (ref 4.0–10.5)
nRBC: 0 % (ref 0.0–0.2)

## 2023-05-01 LAB — IRON AND TIBC
Iron: 64 ug/dL (ref 45–182)
Saturation Ratios: 29 % (ref 17.9–39.5)
TIBC: 224 ug/dL — ABNORMAL LOW (ref 250–450)
UIBC: 160 ug/dL

## 2023-05-01 LAB — TYPE AND SCREEN
Unit division: 0
Unit division: 0

## 2023-05-01 LAB — HEMOGLOBIN AND HEMATOCRIT, BLOOD
HCT: 27.5 % — ABNORMAL LOW (ref 39.0–52.0)
Hemoglobin: 8.7 g/dL — ABNORMAL LOW (ref 13.0–17.0)

## 2023-05-01 LAB — GLUCOSE, CAPILLARY
Glucose-Capillary: 78 mg/dL (ref 70–99)
Glucose-Capillary: 106 mg/dL — ABNORMAL HIGH (ref 70–99)

## 2023-05-01 LAB — FERRITIN: Ferritin: 215 ng/mL (ref 24–336)

## 2023-05-01 SURGERY — ESOPHAGOGASTRODUODENOSCOPY (EGD) WITH PROPOFOL
Anesthesia: Monitor Anesthesia Care

## 2023-05-01 MED ORDER — PHENYLEPHRINE 80 MCG/ML (10ML) SYRINGE FOR IV PUSH (FOR BLOOD PRESSURE SUPPORT)
PREFILLED_SYRINGE | INTRAVENOUS | Status: DC | PRN
  Administered 2023-05-01: 80 ug via INTRAVENOUS
  Administered 2023-05-01: 120 ug via INTRAVENOUS
  Administered 2023-05-01: 40 ug via INTRAVENOUS
  Administered 2023-05-01: 80 ug via INTRAVENOUS

## 2023-05-01 MED ORDER — HYDROMORPHONE HCL 1 MG/ML IJ SOLN
0.5000 mg | Freq: Four times a day (QID) | INTRAMUSCULAR | Status: DC | PRN
  Administered 2023-05-01 – 2023-05-02 (×4): 0.5 mg via INTRAVENOUS
  Filled 2023-05-01 (×4): qty 1

## 2023-05-01 MED ORDER — LACTATED RINGERS IV SOLN: INTRAVENOUS | Status: DC | PRN

## 2023-05-01 MED ORDER — ADULT MULTIVITAMIN W/MINERALS CH
1.0000 | ORAL_TABLET | Freq: Every day | ORAL | Status: DC
  Administered 2023-05-01 – 2023-05-04 (×4): 1 via ORAL
  Filled 2023-05-01 (×4): qty 1

## 2023-05-01 MED ORDER — SODIUM CHLORIDE 0.9 % IV SOLN: INTRAVENOUS | Status: DC

## 2023-05-01 MED ORDER — PROPOFOL 500 MG/50ML IV EMUL
INTRAVENOUS | Status: DC | PRN
  Administered 2023-05-01: 100 ug/kg/min via INTRAVENOUS

## 2023-05-01 MED ORDER — ENSURE ENLIVE PO LIQD
237.0000 mL | Freq: Two times a day (BID) | ORAL | Status: DC
  Administered 2023-05-02 – 2023-05-04 (×6): 237 mL via ORAL

## 2023-05-01 MED ORDER — ONDANSETRON HCL 4 MG/2ML IJ SOLN: 4.0000 mg | Freq: Once | INTRAMUSCULAR | Status: DC | PRN

## 2023-05-01 MED ORDER — MEDIHONEY WOUND/BURN DRESSING EX PSTE
1.0000 | PASTE | Freq: Every day | CUTANEOUS | Status: DC
  Administered 2023-05-02 – 2023-05-04 (×3): 1 via TOPICAL
  Filled 2023-05-01: qty 44

## 2023-05-01 MED ORDER — LACTATED RINGERS IV SOLN: INTRAVENOUS | Status: DC

## 2023-05-01 MED ORDER — JUVEN PO PACK
1.0000 | PACK | Freq: Two times a day (BID) | ORAL | Status: DC
  Administered 2023-05-02 – 2023-05-04 (×6): 1 via ORAL
  Filled 2023-05-01 (×6): qty 1

## 2023-05-01 MED ORDER — SUCRALFATE 1 GM/10ML PO SUSP
1.0000 g | Freq: Three times a day (TID) | ORAL | Status: DC
  Administered 2023-05-01 – 2023-05-04 (×14): 1 g via ORAL
  Filled 2023-05-01 (×19): qty 10

## 2023-05-01 MED ORDER — PANTOPRAZOLE SODIUM 40 MG PO TBEC
40.0000 mg | DELAYED_RELEASE_TABLET | Freq: Two times a day (BID) | ORAL | Status: DC
  Administered 2023-05-01 – 2023-05-04 (×7): 40 mg via ORAL
  Filled 2023-05-01 (×10): qty 1

## 2023-05-01 SURGICAL SUPPLY — 15 items

## 2023-05-01 NOTE — Op Note (Signed)
[facility] Patient Name: [name] Procedure Date : 05/01/2023 MRN: [id number] Attending MD: Beverley Fiedler , MD, [id number] Date of Birth: 1956-05-23 CSN: [id number] Age: 67 Admit Type: Inpatient Procedure:                Upper GI endoscopy Indications:              Melena, acute on chronic anemia with                            hospitalization in April at [facility] with gastric                            ulcer (pt has been off Eliquis since discharge but                            was not on PPI) Providers:                Carie Caddy. Rhea Belton, MD, Carlena Hurl RN, RN,                            Kandice Robinsons, Technician Referring MD:             IM Teaching Service Medicines:                Monitored Anesthesia Care Complications:            No immediate complications. Estimated Blood Loss:     Estimated blood loss was minimal. Procedure:                Pre-Anesthesia Assessment:                           - Prior to the procedure, a History and Physical                            was performed, and patient medications and                            allergies were reviewed. The patient's tolerance of                            previous anesthesia was also reviewed. The risks                            and benefits of the procedure and the sedation                            options and risks were discussed with the patient.                            All questions were answered, and informed consent                            was obtained. Prior Anticoagulants: The patient has  taken no anticoagulant or antiplatelet agents. ASA                            Grade Assessment: III - A patient with severe                            systemic disease. After reviewing the risks and                            benefits, the patient was deemed in satisfactory                            condition to undergo the procedure.                           After  obtaining informed consent, the endoscope was                            passed under direct vision. Throughout the                            procedure, the patient's blood pressure, pulse, and                            oxygen saturations were monitored continuously. The                            [id number]-H190 ([id number]) Olympus endoscope was introduced                            through the mouth, and advanced to the second part                            of duodenum. The upper GI endoscopy was                            accomplished without difficulty. The patient                            tolerated the procedure well. Scope In: Scope Out: Findings:      LA Grade D (one or more mucosal breaks involving at least 75% of       esophageal circumference) esophagitis with no bleeding was found in the       distal esophagus.      A 2 cm hiatal hernia was present.      One non-bleeding cratered gastric ulcer with no stigmata of bleeding was       found at the incisura. The ulcer has heaped up edges. The lesion was 20       mm in largest dimension. Biopsies were taken with a cold forceps for       histology to rule out neoplasia.      One non-bleeding cratered duodenal ulcer with no stigmata of bleeding       was found in the duodenal bulb. The lesion was 10 mm in largest  dimension.      The second portion of the duodenum was normal. Impression:               - LA Grade D reflux esophagitis with no bleeding.                           - 2 cm hiatal hernia.                           - Large, non-bleeding gastric ulcer. Biopsied.                           - Non-bleeding duodenal ulcer with no stigmata of                            bleeding and bulbar duodenitis.                           - Normal second portion of the duodenum. Moderate Sedation:      N/A Recommendation:           - Return patient to hospital ward for ongoing care.                           - Advance diet as tolerated.                            - Continue present medications. BID PPI x 12 weeks                            (at least).                           - Surveillance EGD to check healing of gastric                            ulcer particularly in 12 weeks is recommended.                           - Await pathology results.                           - No aspirin, ibuprofen, naproxen, or other                            non-steroidal anti-inflammatory drugs.                           - Hold DOAC given esophagitis and gastric +                            duodenal ulcers.                           - Liquid sucralfate TIDAC and HS can be used also  for 1-2 weeks.                           - Monitor Hgb closely. Procedure Code(s):        --- Professional ---                           [phone], Esophagogastroduodenoscopy, flexible,                            transoral; with biopsy, single or multiple Diagnosis Code(s):        --- Professional ---                           K21.00, Gastro-esophageal reflux disease with                            esophagitis, without bleeding                           K44.9, Diaphragmatic hernia without obstruction or                            gangrene                           K25.9, Gastric ulcer, unspecified as acute or                            chronic, without hemorrhage or perforation                           K26.9, Duodenal ulcer, unspecified as acute or                            chronic, without hemorrhage or perforation                           K92.1, Melena (includes Hematochezia) CPT copyright 2022 American [employer]. All rights reserved. The codes documented in this report are preliminary and upon coder review may  be revised to meet current compliance requirements. Beverley Fiedler, MD 05/01/2023 10:19:40 AM This report has been signed electronically. Number of Addenda: 0

## 2023-05-01 NOTE — Progress Notes (Signed)
PT Cancellation Note  Patient Details Name: [name] MRN: [id number] DOB: 01/26/1956   Cancelled Treatment:    Reason Eval/Treat Not Completed: Patient at procedure or test/unavailable (endoscopy)   [name] [name] 05/01/2023, 10:01 AM [name], PT Acute Rehabilitation Services Office: [phone]

## 2023-05-01 NOTE — Interval H&P Note (Signed)
History and Physical Interval Note: For egd to eval melena and anemia. Hx of pud The nature of the procedure, as well as the risks, benefits, and alternatives were carefully and thoroughly reviewed with the patient. Ample time for discussion and questions allowed. The patient understood, was satisfied, and agreed to proceed.   Lab Results  Component Value Date   WBC 13.0 (H) 05/01/2023   HGB 8.7 (L) 05/01/2023   HCT 27.2 (L) 05/01/2023   MCV 96.8 05/01/2023   PLT 257 05/01/2023      05/01/2023 9:26 AM  [name]  has presented today for surgery, with the diagnosis of Acute on chronic anemia, melena, history of PUD.  The various methods of treatment have been discussed with the patient and family. After consideration of risks, benefits and other options for treatment, the patient has consented to  Procedure(s): ESOPHAGOGASTRODUODENOSCOPY (EGD) WITH PROPOFOL (N/A) as a surgical intervention.  The patient's history has been reviewed, patient examined, no change in status, stable for surgery.  I have reviewed the patient's chart and labs.  Questions were answered to the patient's satisfaction.     Carie Caddy [name]

## 2023-05-01 NOTE — Progress Notes (Signed)
OT Cancellation Note  Patient Details Name: [name] MRN: [id number] DOB: [birth year]   Cancelled Treatment:    Reason Eval/Treat Not Completed: Patient at procedure or test/ unavailable. Will return.  Evern Bio 05/01/2023, 9:05 AM Berna Spare, OTR/L Acute Rehabilitation Services Office: [phone]

## 2023-05-01 NOTE — Progress Notes (Signed)
Initial Nutrition Assessment  DOCUMENTATION CODES:   Severe malnutrition in context of chronic illness  INTERVENTION:  Multivitamin w/ minerals daily Ensure Enlive po BID, each supplement provides 350 kcal and 20 grams of protein. 1 packet Juven BID, each packet provides 95 calories, 2.5 grams of protein (collagen), and 9.8 grams of carbohydrate (3 grams sugar); also contains 7 grams of L-arginine and L-glutamine, 300 mg vitamin C, 15 mg vitamin E, 1.2 mcg vitamin B-12, 9.5 mg zinc, 200 mg calcium, and 1.5 g  Calcium Beta-hydroxy-Beta-methylbutyrate to support wound healing Magic cup TID with meals, each supplement provides 290 kcal and 9 grams of protein Double protein with all meals  NUTRITION DIAGNOSIS:   Severe Malnutrition related to chronic illness as evidenced by severe muscle depletion, severe fat depletion.  GOAL:   Patient will meet greater than or equal to 90% of their needs  MONITOR:   PO intake, Supplement acceptance, Labs, Skin  REASON FOR ASSESSMENT:   Consult Assessment of nutrition requirement/status  ASSESSMENT:   67 y.o. male presented to the ED with recurrence of bloody stools; recently admitted for upper GI bleed. PMH includes DM, malnutrition, and peptic ulcer disease. Pt admitted with acute on chronic anemia secondary to melena and AKI.   Pt laying in bed, family at bedside at time of RD visit. Reports that his appetite has improved today, was able to eat 100% of his lunch. Reports that his appetite was bad for 3-4 days PTA. Normally at home pt only eats 1 meal per day, typically a sandwich or TV dinner. Family member shares that he can cook for him but won't eat it. Does not drink any oral nutrition supplements.  Pt reports that over the past 3 years he has lost weight, reports he use to weight 350# and that he lost 150#. Family states that weight loss has occurred over about 5 years. Pt shares that he used a wheelchair until recently hasn't been able to get  around as much due to breaking his leg.   Discussed using Ensure with pt, pt agreeable to drinking chocolate ensure. RD to order additional supplements due to multiple wounds.   Medications reviewed and include: Remeron, Protonix, Miralax, IV Protonix Labs reviewed: Sodium 138, Potassium 4.1, Hgb A1c 5.7%   NUTRITION - FOCUSED PHYSICAL EXAM:  Flowsheet Row Most Recent Value  Orbital Region Severe depletion  Upper Arm Region Severe depletion  Thoracic and Lumbar Region Severe depletion  Buccal Region Severe depletion  Temple Region Severe depletion  Clavicle Bone Region Severe depletion  Clavicle and Acromion Bone Region Severe depletion  Scapular Bone Region Severe depletion  Dorsal Hand Severe depletion  Patellar Region Severe depletion  Anterior Thigh Region Severe depletion  Posterior Calf Region Severe depletion  Edema (RD Assessment) None  Hair Reviewed  Eyes Reviewed  Mouth Reviewed  Skin Reviewed  Nails Reviewed   Diet Order:   Diet Order             DIET SOFT Room service appropriate? Yes; Fluid consistency: Thin  Diet effective now                   EDUCATION NEEDS:   Education needs have been addressed  Skin:  Per WOC nurse note, 05/01/23; 1. L upper buttocks 1 cm x 0.5 cm Unstageable Pressure Injury 100% yellow slough  2. L trochanter 7 cms x 2 cms healed incision (2 pink areas of partial thickness skin loss)  3. R trochanter 6 cms x  2 cms Stage 2 Pressure Injury 100% pink and moist  4. R hip lateral to R buttock 1 cm x 2.5 cms pink and moist  5. L medial foot (near base of great toe)l 1 cm x 2 cm Deep Tissue Injury purple maroon discoloration  6. R upper buttock 0.5 cm x 0.5 cm Unstageable Pressure Injury 75% yellow 25% pink moist  7. R lateral ankle 1 cm x 1 cm Deep Tissue Pressure Injury 100% purple maroon discoloration  8. L medial heel 4 cm x 6 cm Deep Tissue Pressure Injury 100% purple maroon discoloration  9. Four Scattered Stage 2 Pressure  Injuries to sacrum, small 0.5 cm x 0.5 cm each 100% pink and moist    Last BM:  5/9  Height:  Ht Readings from Last 1 Encounters:  04/30/23 5\' 10"  (1.778 m)   Weight:  Wt Readings from Last 1 Encounters:  04/30/23 63.5 kg   Ideal Body Weight:  75.5 kg  BMI:  Body mass index is 20.09 kg/m.  Estimated Nutritional Needs:  Kcal:  2200-2400 Protein:  120-140 grams Fluid:  >/= 2 L   Kirby Crigler RD, LDN Clinical Dietitian See [facility] for contact information.

## 2023-05-01 NOTE — Transfer of Care (Signed)
Immediate Anesthesia Transfer of Care Note  Patient: [name]  Procedure(s) Performed: ESOPHAGOGASTRODUODENOSCOPY (EGD) WITH PROPOFOL  Patient Location: PACU  Anesthesia Type:MAC  Level of Consciousness: patient cooperative and responds to stimulation  Airway & Oxygen Therapy: Patient Spontanous Breathing  Post-op Assessment: Report given to RN and Post -op Vital signs reviewed and stable  Post vital signs: Reviewed and stable  Last Vitals:  Vitals Value Taken Time  BP 90/59 05/01/23 1016  Temp    Pulse 80 05/01/23 1018  Resp 23 05/01/23 1018  SpO2 100 % 05/01/23 1018  Vitals shown include unvalidated device data.  Last Pain:  Vitals:   05/01/23 0910  TempSrc: Temporal  PainSc: 5          Complications: No notable events documented.

## 2023-05-01 NOTE — Anesthesia Preprocedure Evaluation (Addendum)
Anesthesia Evaluation  Patient identified by MRN, date of birth, ID band Patient awake    Reviewed: Allergy & Precautions, H&P , NPO status , Patient's Chart, lab work & pertinent test results  Airway Mallampati: II  TM Distance: >3 FB Neck ROM: Full    Dental no notable dental hx.    Pulmonary Current Smoker and Patient abstained from smoking.   Pulmonary exam normal breath sounds clear to auscultation       Cardiovascular hypertension, Pt. on medications Normal cardiovascular exam Rhythm:Regular Rate:Normal     Neuro/Psych negative neurological ROS  negative psych ROS   GI/Hepatic Neg liver ROS, PUD,,,malnourished   Endo/Other  diabetes    Renal/GU Renal InsufficiencyRenal disease  negative genitourinary   Musculoskeletal negative musculoskeletal ROS (+)    Abdominal   Peds negative pediatric ROS (+)  Hematology  (+) Blood dyscrasia, anemia   Anesthesia Other Findings   Reproductive/Obstetrics negative OB ROS                              Anesthesia Physical Anesthesia Plan  ASA: 3  Anesthesia Plan: MAC   Post-op Pain Management: Minimal or no pain anticipated   Induction: Intravenous  PONV Risk Score and Plan: 1 and Propofol infusion and Treatment may vary due to age or medical condition  Airway Management Planned: Simple Face Mask  Additional Equipment:   Intra-op Plan:   Post-operative Plan:   Informed Consent: I have reviewed the patients History and Physical, chart, labs and discussed the procedure including the risks, benefits and alternatives for the proposed anesthesia with the patient or authorized representative who has indicated his/her understanding and acceptance.     Dental advisory given  Plan Discussed with: CRNA and Surgeon  Anesthesia Plan Comments:         Anesthesia Quick Evaluation

## 2023-05-01 NOTE — Progress Notes (Addendum)
Rounding is complete. Pt is asleep. Call bell activated. NAD noted at this time. RN will continue to monitor for acute changes. Pt voiding, repositioned Q 2 hours. Pt c/o of pain and is wanting Oxy. Pt nephew reports Pt is trying to take less Oxy at home and request that he is not given any here. Pt C/O 9/10 leg pain.

## 2023-05-01 NOTE — Consult Note (Addendum)
WOC Nurse Consult Note: Reason for Consult:sacral wounds  Wound type: Pressure Pressure Injury POA: Yes Measurement: 1.  L upper buttocks 1 cm x 0.5 cm Unstageable Pressure Injury 100% yellow slough  2. L trochanter 7 cms x 2 cms healed incision (2 pink areas of partial thickness skin loss)  3.  R trochanter 6 cms x 2 cms Stage 2 Pressure Injury 100% pink and moist  4. R hip lateral to R buttock 1 cm x 2.5 cms pink and moist  5.  L medial foot (near base of great toe)l 1 cm x 2 cm Deep Tissue Injury purple maroon discoloration  6.  R upper buttock 0.5 cm x 0.5 cm Unstageable Pressure Injury 75% yellow 25% pink moist  7.  R lateral ankle 1 cm x 1 cm Deep Tissue Pressure Injury 100% purple maroon discoloration  8.  L medial heel 4 cm x 6 cm Deep Tissue Pressure Injury 100% purple maroon discoloration  9.  Four Scattered Stage 2 Pressure Injuries to sacrum, small 0.5 cm x 0.5 cm each 100% pink and moist   Drainage (amount, consistency, odor) minimal serosanguinous to Unstageable Pressure Injuries  L and R upper buttocks  Periwound: intact  Dressing procedure/placement/frequency:  Clean PI to L and R upper buttocks (ones with yellow tissue) with NS, apply Medihoney daily to wound bed cover with dry gauze and silicone foam. May lift foam dressing daily to reapply Medihoney.  Change foam dressing q3 days and prn soiling.  L medial foot, R lateral ankle, and L medial heel Deep Tissue Pressure Injuries clean with NS daily and apply a single layer Xeroform gauze [place] [phone]) cut to fit daily.  May cover with silicone foam, lift silicone foam daily to reapply Xeroform. Change foam dressing q3 days and prn soiling.  All other Stage 2 PI cover with silicone foam, lift foam to assess daily. Change foam dressings q3 days and prn soiling.   Patient placed on low air loss mattress during this visit for pressure redistribution and moisture management.  Prevalon boots [place] [phone]) should be placed bilaterally  to offload heels.   WOC team will not follow this patient. Re-consult if further needs arise.   Thank you,    Priscella Mann MSN, RN-BC, [employer] [phone]

## 2023-05-01 NOTE — Progress Notes (Signed)
Subjective:  [name] is a 67 year old male with hypertension, hyperlipidemia, diabetes, history of peptic ulcer disease, and recent hospitalization for upper GI bleed who presented for evaluation of recurrence of bloody stools, admitted for evaluation and treatment of presumed GI bleed.  Currently, the patient reports increased pain, muscle spasms of the back and legs. Back pain is mostly in mid-lower back while leg pain travels down entire left leg. Of note, patient still has several sutures (~10) in place on left leg from femoral nailing performed on 04/05. Has not had an additional bloody bowel movement since admission. Reports an appetite but has been kept NPO for scheduled endoscopy today.   Interval Events: None    Objective:  Vital Signs:   Temp:  97.9 F (36.6 C)  Pulse Rate:  [71-102] 81  Resp:  [17-23] 18  BP: (84-118)/(52-75) 96/54  SpO2:  [100 %] 100 % on 2L [place]  Weight:  [63.5 kg] 63.5 kg Last BM Date : 04/30/23   Physical Exam: General: Cachectic, lying in bed. No acute distress.   Lungs:  Normal respiratory effort on 2L [place]. CTAB.  Heart: RRR. S1 and S2 normal without gallop, murmur, or rubs.  Abdomen:  BS normoactive. Soft, Nondistended, non-tender.  No masses or organomegaly.  Extremities: No pretibial edema. ~10 sutures (total) of left lower extremity around knee, lateral thigh. No drainage, erythema noted around sutures.     Labs:  Basic Metabolic Panel: Recent Labs  Lab 04/30/23 1555 04/30/23 1614 05/01/23 0611  NA 137 138 138  K 4.6 4.5 4.1  CL 109 108 107  CO2 20*  --  20*  GLUCOSE 131* 129* 80  BUN 60* 53* 46*  CREATININE 1.32* 1.40* 1.10  CALCIUM 8.0*  --  8.1*    CBC: Recent Labs  Lab 04/30/23 1555 04/30/23 1614 04/30/23 2024 04/30/23 2324 05/01/23 0611  WBC 11.5*  --   --   --  13.0*  NEUTROABS 8.4*  --   --   --   --   HGB 7.1* 7.1* 9.1* 9.6* 8.7*  HCT 23.2* 21.0* 27.7* 29.2* 27.2*  MCV 102.2*  --   --   --  96.8  PLT  355  --   --   --  257      Assessment & Plan:  [name] is a 67 year old male with hypertension, hyperlipidemia, diabetes, history of peptic ulcer disease, and recent hospitalization for upper GI bleed who presented with recurrence of bloody stools, admitted for management of likely GI bleed.  #GI bleed, melena  Acute on chronic anemia  Patient presents with 2-day history of bloody stools. Likely secondary to gastric/duodenal ulcers, which were noted on recent EGD (4/22). Patient's history of dark stools and long-standing anemia are consistent with this etiology. Hemoglobin has improved since admission following transfusion with 2 units (7.1>9.6>8.7). Will check a iron panel, ferritin to evaluate for concomitant IDA. Elevated WBC count this morning likely secondary to mild, non-febrile reaction to blood products. There is low concern for active, high-volume bleeding as patient has not had another bloody BM. He is scheduled to receive a repeat EGD this morning to determine source.  -Gastroenterology following, appreciate recommendations -Endoscopy with Dr. Rhea Belton this AM -Daily CBC, transfuse for Hgb <7 -Follow-up iron panel, ferritin  -Continue IV Protonix -PT/OT  #Muscle spasm  Leg, back pain Patient has a history of muscle spasms, back/leg pain. Managed well with home medications. Given anemia, will check iron panel, which can contribute to  muscle spasms/cramps. Patient also has long-standing sutures in place on left leg and pressure ulcers of the sacrum, which are likely contributing to pain.  -Start home gabapentin 800 mg PO TID  -Start IV dilaudid 0.5 mg q6h PRN for moderate pain -Follow-up iron panel   #AKI Patient's sCr elevated 1.40 on admission. AKI likely secondary to hypovolemia and has improved with fluid resuscitation. Back near baseline (~1.00) this morning with sCr of 1.10.  -Fluid resuscitation -Daily BMP   #Malnutrition BMI 20.09.  Protein calorie malnutrition  identified during last admission. Patient started on low-dose mirtazapine 50 mg nightly.  Seems to be a chronic problem, depression may be contributing. However, patient reports an appetite this morning and can resume diet pending GI recommendations.  -Continue mirtazapine 50 mg nightly -Follow-up dietitian recommendations  #Sacral pressure ulcers Patient presents with multiple sacral pressure ulcers. Of note, patient has been bedridden since discharge on 04/10.  -Consult wound care  Length of Stay: 1 day(s)   Signed:  Lyda Kalata, Medical Student 05/01/2023, 9:20 AM

## 2023-05-01 NOTE — Progress Notes (Signed)
OT Cancellation Note  Patient Details Name: [name] MRN: [id number] DOB: 1956-05-24   Cancelled Treatment:    Reason Eval/Treat Not Completed: OT screened, no needs identified, will sign off. Pt is non ambulatory and at his baseline in ADLs. No OT needs.  Evern Bio 05/01/2023, 2:11 PM Berna Spare, OTR/L Acute Rehabilitation Services Office: [phone]

## 2023-05-01 NOTE — Progress Notes (Signed)
Orthopaedic Trauma Service Progress Note  Patient ID: [name] MRN: [id number] DOB/AGE: [birth year] 66 y.o.  Subjective:  Some pain in L leg   Retrograde L femoral nail approximately 4 weeks ago    ROS As above  Objective:   VITALS:   Vitals:   05/01/23 0910 05/01/23 1015 05/01/23 1030 05/01/23 1112  BP: (!) 96/54 (!) 90/59 (!) 92/57 103/61  Pulse: 81 84 82 85  Resp: 18 20 19 18   Temp: 97.9 F (36.6 C) (!) 97 F (36.1 C)  98 F (36.7 C)  TempSrc: Temporal     SpO2: 100% 100% 100% 100%  Weight:      Height:        Estimated body mass index is 20.09 kg/m as calculated from the following:   Height as of this encounter: 5\' 10"  (1.778 m).   Weight as of this encounter: 63.5 kg.   Intake/Output      05/09 0701 05/10 0700 05/10 0701 05/11 0700   P.O. 120 0   I.V. (mL/kg) 0.1 (0) 300 (4.7)   Total Intake(mL/kg) 120.1 (1.9) 300 (4.7)   Urine (mL/kg/hr) 600 250 (0.8)   Stool  0   Total Output 600 250   Net -479.9 +50        Urine Occurrence  0 x   Stool Occurrence  0 x     LABS  Results for orders placed or performed during the hospital encounter of 04/30/23 (from the past 24 hour(s))  Comprehensive metabolic panel     Status: Abnormal   Collection Time: 04/30/23  3:55 PM  Result Value Ref Range   Sodium 137 135 - 145 mmol/L   Potassium 4.6 3.5 - 5.1 mmol/L   Chloride 109 98 - 111 mmol/L   CO2 20 (L) 22 - 32 mmol/L   Glucose, Bld 131 (H) 70 - 99 mg/dL   BUN 60 (H) 8 - 23 mg/dL   Creatinine, Ser 4.09 (H) 0.61 - 1.24 mg/dL   Calcium 8.0 (L) 8.9 - 10.3 mg/dL   Total Protein 5.5 (L) 6.5 - 8.1 g/dL   Albumin 2.3 (L) 3.5 - 5.0 g/dL   AST 17 15 - 41 U/L   ALT 11 0 - 44 U/L   Alkaline Phosphatase 81 38 - 126 U/L   Total Bilirubin 0.5 0.3 - 1.2 mg/dL   GFR, Estimated 60 (L) >60 mL/min   Anion gap 8 5 - 15  CBC with Differential     Status: Abnormal   Collection Time: 04/30/23   3:55 PM  Result Value Ref Range   WBC 11.5 (H) 4.0 - 10.5 K/uL   RBC 2.27 (L) 4.22 - 5.81 MIL/uL   Hemoglobin 7.1 (L) 13.0 - 17.0 g/dL   HCT 81.1 (L) 91.4 - 78.2 %   MCV 102.2 (H) 80.0 - 100.0 fL   MCH 31.3 26.0 - 34.0 pg   MCHC 30.6 30.0 - 36.0 g/dL   RDW 95.6 21.3 - 08.6 %   Platelets 355 150 - 400 K/uL   nRBC 0.0 0.0 - 0.2 %   Neutrophils Relative % 72 %   Neutro Abs 8.4 (H) 1.7 - 7.7 K/uL   Lymphocytes Relative 20 %   Lymphs Abs 2.3 0.7 - 4.0 K/uL   Monocytes Relative 6 %  Monocytes Absolute 0.7 0.1 - 1.0 K/uL   Eosinophils Relative 1 %   Eosinophils Absolute 0.1 0.0 - 0.5 K/uL   Basophils Relative 1 %   Basophils Absolute 0.1 0.0 - 0.1 K/uL   Immature Granulocytes 0 %   Abs Immature Granulocytes 0.05 0.00 - 0.07 K/uL  Protime-INR     Status: Abnormal   Collection Time: 04/30/23  3:55 PM  Result Value Ref Range   Prothrombin Time 15.9 (H) 11.4 - 15.2 seconds   INR 1.3 (H) 0.8 - 1.2  Type and screen [facility]     Status: None   Collection Time: 04/30/23  3:55 PM  Result Value Ref Range   ABO/RH(D) B POS    Antibody Screen NEG    Sample Expiration 05/03/2023,2359    Unit Number [id number]    Blood Component Type RED CELLS,LR    Unit division 00    Status of Unit ISSUED,FINAL    Transfusion Status OK TO TRANSFUSE    Crossmatch Result COMPATIBLE    Unit Number [id number]    Blood Component Type RED CELLS,LR    Unit division 00    Status of Unit ISSUED,FINAL    Unit tag comment EMERGENCY RELEASE    Transfusion Status OK TO TRANSFUSE    Crossmatch Result COMPATIBLE   Prepare RBC     Status: None   Collection Time: 04/30/23  4:03 PM  Result Value Ref Range   Order Confirmation      ORDER PROCESSED BY BLOOD BANK Performed at [facility], [address] [place], [place] [address]   POC occult blood, ED     Status: Abnormal   Collection Time: 04/30/23  4:12 PM  Result Value Ref Range   Fecal Occult Bld POSITIVE (A) NEGATIVE   I-stat chem 8, ED     Status: Abnormal   Collection Time: 04/30/23  4:14 PM  Result Value Ref Range   Sodium 138 135 - 145 mmol/L   Potassium 4.5 3.5 - 5.1 mmol/L   Chloride 108 98 - 111 mmol/L   BUN 53 (H) 8 - 23 mg/dL   Creatinine, Ser 7.84 (H) 0.61 - 1.24 mg/dL   Glucose, Bld 696 (H) 70 - 99 mg/dL   Calcium, Ion 2.95 (L) 1.15 - 1.40 mmol/L   TCO2 20 (L) 22 - 32 mmol/L   Hemoglobin 7.1 (L) 13.0 - 17.0 g/dL   HCT 28.4 (L) 13.2 - 44.0 %  Hemoglobin and hematocrit, blood     Status: Abnormal   Collection Time: 04/30/23  8:24 PM  Result Value Ref Range   Hemoglobin 9.1 (L) 13.0 - 17.0 g/dL   HCT 10.2 (L) 72.5 - 36.6 %  Hemoglobin and hematocrit, blood     Status: Abnormal   Collection Time: 04/30/23 11:24 PM  Result Value Ref Range   Hemoglobin 9.6 (L) 13.0 - 17.0 g/dL   HCT 44.0 (L) 34.7 - 42.5 %  Basic metabolic panel     Status: Abnormal   Collection Time: 05/01/23  6:11 AM  Result Value Ref Range   Sodium 138 135 - 145 mmol/L   Potassium 4.1 3.5 - 5.1 mmol/L   Chloride 107 98 - 111 mmol/L   CO2 20 (L) 22 - 32 mmol/L   Glucose, Bld 80 70 - 99 mg/dL   BUN 46 (H) 8 - 23 mg/dL   Creatinine, Ser 9.56 0.61 - 1.24 mg/dL   Calcium 8.1 (L) 8.9 - 10.3 mg/dL  GFR, Estimated >60 >60 mL/min   Anion gap 11 5 - 15  CBC     Status: Abnormal   Collection Time: 05/01/23  6:11 AM  Result Value Ref Range   WBC 13.0 (H) 4.0 - 10.5 K/uL   RBC 2.81 (L) 4.22 - 5.81 MIL/uL   Hemoglobin 8.7 (L) 13.0 - 17.0 g/dL   HCT 91.4 (L) 78.2 - 95.6 %   MCV 96.8 80.0 - 100.0 fL   MCH 31.0 26.0 - 34.0 pg   MCHC 32.0 30.0 - 36.0 g/dL   RDW 21.3 (H) 08.6 - 57.8 %   Platelets 257 150 - 400 K/uL   nRBC 0.0 0.0 - 0.2 %  Glucose, capillary     Status: None   Collection Time: 05/01/23 10:24 AM  Result Value Ref Range   Glucose-Capillary 78 70 - 99 mg/dL     PHYSICAL EXAM:   Gen: in bed, resting  Ext:       Left Lower Extremity   All wounds well healed  Sutures removed by myself  Minimal  swelling  Minimal tenderness around distal femur    Xrays  2 view left femur: stable alignment L distal femur fracture. Stable hardware     Assessment/Plan:   Anti-infectives (From admission, onward)    None     .  67 y/o [name] s/p retrograde femoral nail left femur 03/27/2023  -left distal femur fracture s/p retrograde femoral nail  Weightbearing Ok to WBAT L leg for transfers     ROM/Activity   Unrestricted ROM L knee and hip    Wound care   Leave wounds open to air   Routine hygiene, clean with soap and water   Repeat xrays in 2-3 weeks    Ice PRN pain and swelling    Mearl Latin, PA-C [phone] (C) 05/01/2023, 12:00 PM  [facility] [address] [place] [address] [phone] Val [phone] (F)    After 5pm and on the weekends please log on to Amion, go to orthopaedics and the look under the [facility] Call for the provider(s) on call. You can also call our office at [phone] and then follow the prompts to be connected to the call team.  Patient ID: [name], [name]   DOB: [birth year], 67 y.o.   MRN: [id number]

## 2023-05-01 NOTE — Anesthesia Postprocedure Evaluation (Signed)
Anesthesia Post Note  Patient: [name]  Procedure(s) Performed: ESOPHAGOGASTRODUODENOSCOPY (EGD) WITH PROPOFOL     Patient location during evaluation: PACU Anesthesia Type: MAC Level of consciousness: awake and alert Pain management: pain level controlled Vital Signs Assessment: post-procedure vital signs reviewed and stable Respiratory status: spontaneous breathing, nonlabored ventilation, respiratory function stable and patient connected to nasal cannula oxygen Cardiovascular status: stable and blood pressure returned to baseline Postop Assessment: no apparent nausea or vomiting Anesthetic complications: no  No notable events documented.  Last Vitals:  Vitals:   05/01/23 0910 05/01/23 1015  BP: (!) 96/54 (!) 90/59  Pulse: 81 84  Resp: 18 20  Temp: 36.6 C (!) 36.1 C  SpO2: 100% 100%    Last Pain:  Vitals:   05/01/23 0910  TempSrc: Temporal  PainSc: 5                  Jensen Kilburg S

## 2023-05-01 NOTE — TOC Initial Note (Signed)
Transition of Care [facility]) - Initial/Assessment Note    Patient Details  Name: [name] MRN: [id number] Date of Birth: 23-Apr-1956  Transition of Care [facility]) CM/SW Contact:    Tom-Johnson, Hershal Coria, RN Phone Number: 05/01/2023, 3:57 PM  Clinical Narrative:                  CM spoke with patient and his nephew, Clifton Custard at bedside about needs for post hospital transition.  Admitted for Acute GI Bleed. Patient recently hospitalized with same diagnosis. Hgb on admit was 7.1. 2U PRBC given, today hgb at 8.7. Underwent Upper Endoscopy today 05/01/23. GI following.  Cl[place]ton Custard states patient is from home alone but he checks on him frequently. Patient has been to several SNF's and they are disappointed with the care he received. Patient declining to go to SNF at this time. Requesting to go home even though he knows he has limited assistance.  CM explained to them the services he will receive from home health and that they will not be there everyday. Cl[place] Custard states he had called Medicaid for the CAP and PCS program but not able to get assistance.  CM called and spoke with a Case worker Sarajane Jews [phone]) in [place] with patient's permission. Ms. Patsi Sears states patient's Case Worker is Building services engineer in [place]. CM called and left a secure message for a return call. [phone]).  PT recommended home health PT. Referral sent to [facility]. Awaiting response. CM will continue to follow as patient progresses with care towards discharge.        Expected Discharge Plan: Home w Home Health Services Barriers to Discharge: Continued Medical Work up   Patient Goals and CMS Choice Patient states their goals for this hospitalization and ongoing recovery are:: To return home CMS Medicare.gov Compare Post Acute Care list provided to:: Patient Choice offered to / list presented to : Patient, Adult Children Aurther Loft, [place]ton Custard)      Expected Discharge Plan and Services   Discharge  Planning Services: CM Consult Post Acute Care Choice: Home Health Living arrangements for the past 2 months: Single Family Home                 DME Arranged: N/A DME Agency: NA       HH Arranged: PT          Prior Living Arrangements/Services Living arrangements for the past 2 months: Single Family Home Lives with:: Self Patient language and need for interpreter reviewed:: Yes        Need for Family Participation in Patient Care: Yes (Comment) Care giver support system in place?: Yes (comment) Current home services: DME (All necessary DME's) Criminal Activity/Legal Involvement Pertinent to Current Situation/Hospitalization: No - Comment as needed  Activities of Daily Living Home Assistive Devices/Equipment: Hoyer Lift ADL Screening (condition at time of admission) Patient's cognitive ability adequate to safely complete daily activities?: Yes Is the patient deaf or have difficulty hearing?: No Does the patient have difficulty seeing, even when wearing glasses/contacts?: No Does the patient have difficulty concentrating, remembering, or making decisions?: No Patient able to express need for assistance with ADLs?: No Does the patient have difficulty dressing or bathing?: Yes Independently performs ADLs?: No Does the patient have difficulty walking or climbing stairs?: Yes Weakness of Legs: Right Weakness of Arms/Hands: Left  Permission Sought/Granted Permission sought to share information with : Case Manager, Magazine features editor, Family Supports Permission granted to share information with : Yes, Verbal Permission Granted  Emotional Assessment Appearance:: Appears stated age Attitude/Demeanor/Rapport: Engaged, Gracious Affect (typically observed): Accepting, Appropriate, Calm, Hopeful, Pleasant Orientation: : Oriented to Self, Oriented to Place, Oriented to Situation Alcohol / Substance Use: Not Applicable Psych Involvement: No  (comment)  Admission diagnosis:  Acute blood loss anemia [D62] Acute GI bleeding [K92.2] Patient Active Problem List   Diagnosis Date Noted   Pressure injury of sacral region, stage 2 (HCC) 05/01/2023   Acute esophagitis 05/01/2023   Gastric ulcer 05/01/2023   Duodenal ulcer 05/01/2023   Acute GI bleeding 04/30/2023   Acute blood loss anemia 04/30/2023   Melena 04/30/2023   History of gastric ulcer 04/30/2023   AKI (acute kidney injury) (HCC) 03/27/2023   Traumatic rhabdomyolysis (HCC) 03/27/2023   Closed femur fracture (HCC) 03/26/2023   Malnutrition of moderate degree 09/11/2020   Loss of weight 09/07/2020   Candidal intertrigo 09/07/2020   Fall at home 09/06/2020   Hypoglycemia 07/31/2020   Purulent Cellulitis of left hip 07/15/2020   Recurrent falls 07/15/2020   Frailty syndrome in geriatric patient 07/15/2020   Physical deconditioning 07/15/2020   Asymptomatic bacteriuria 07/15/2020   Diabetes mellitus (HCC) 01/28/2012   PCP:  Pcp, No Pharmacy:   [id number] [place], [place] - [address] [place] [address] Phone: [phone] Fax: [phone]     Social Determinants of Health (SDOH) Social History: SDOH Screenings   Food Insecurity: No Food Insecurity (05/01/2023)  Housing: Low Risk  (05/01/2023)  Transportation Needs: No Transportation Needs (05/01/2023)  Utilities: Not At Risk (05/01/2023)  Tobacco Use: High Risk (05/01/2023)   SDOH Interventions:     Readmission Risk Interventions    09/17/2020    3:07 PM  Readmission Risk Prevention Plan  Post Dischage Appt Not Complete  Appt Comments SNF placement  Medication Screening Complete  Transportation Screening Complete
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
## 2023-05-02 DIAGNOSIS — K921 Melena: Secondary | ICD-10-CM | POA: Diagnosis not present

## 2023-05-02 DIAGNOSIS — M549 Dorsalgia, unspecified: Secondary | ICD-10-CM

## 2023-05-02 DIAGNOSIS — K250 Acute gastric ulcer with hemorrhage: Secondary | ICD-10-CM

## 2023-05-02 DIAGNOSIS — M62838 Other muscle spasm: Secondary | ICD-10-CM

## 2023-05-02 DIAGNOSIS — K449 Diaphragmatic hernia without obstruction or gangrene: Secondary | ICD-10-CM | POA: Diagnosis not present

## 2023-05-02 DIAGNOSIS — D62 Acute posthemorrhagic anemia: Secondary | ICD-10-CM

## 2023-05-02 DIAGNOSIS — M79669 Pain in unspecified lower leg: Secondary | ICD-10-CM

## 2023-05-02 DIAGNOSIS — L89150 Pressure ulcer of sacral region, unstageable: Secondary | ICD-10-CM | POA: Diagnosis not present

## 2023-05-02 DIAGNOSIS — K279 Peptic ulcer, site unspecified, unspecified as acute or chronic, without hemorrhage or perforation: Secondary | ICD-10-CM

## 2023-05-02 DIAGNOSIS — K2100 Gastro-esophageal reflux disease with esophagitis, without bleeding: Secondary | ICD-10-CM | POA: Diagnosis not present

## 2023-05-02 DIAGNOSIS — E43 Unspecified severe protein-calorie malnutrition: Secondary | ICD-10-CM | POA: Insufficient documentation

## 2023-05-02 LAB — CBC
HCT: 23.8 % — ABNORMAL LOW (ref 39.0–52.0)
Hemoglobin: 7.7 g/dL — ABNORMAL LOW (ref 13.0–17.0)
MCH: 31.3 pg (ref 26.0–34.0)
MCHC: 32.4 g/dL (ref 30.0–36.0)
MCV: 96.7 fL (ref 80.0–100.0)
Platelets: 247 10*3/uL (ref 150–400)
RBC: 2.46 MIL/uL — ABNORMAL LOW (ref 4.22–5.81)
RDW: 16.6 % — ABNORMAL HIGH (ref 11.5–15.5)
WBC: 16.9 10*3/uL — ABNORMAL HIGH (ref 4.0–10.5)
nRBC: 0 % (ref 0.0–0.2)

## 2023-05-02 LAB — GLUCOSE, CAPILLARY
Glucose-Capillary: 68 mg/dL — ABNORMAL LOW (ref 70–99)
Glucose-Capillary: 121 mg/dL — ABNORMAL HIGH (ref 70–99)
Glucose-Capillary: 163 mg/dL — ABNORMAL HIGH (ref 70–99)
Glucose-Capillary: 170 mg/dL — ABNORMAL HIGH (ref 70–99)

## 2023-05-02 MED ORDER — OXYCODONE-ACETAMINOPHEN 5-325 MG PO TABS
1.0000 | ORAL_TABLET | Freq: Three times a day (TID) | ORAL | Status: DC | PRN
  Administered 2023-05-02 – 2023-05-03 (×3): 1 via ORAL
  Filled 2023-05-02 (×4): qty 1

## 2023-05-02 NOTE — Op Note (Signed)
03/27/2023  10:19 AM  PATIENT:  [name]  [birth year] male   MEDICAL RECORD NUMBER: [id number]  PREOPERATIVE DIAGNOSIS:  LEFT FEMORAL SHAFT FRACTURE.  POSTOPERATIVE DIAGNOSIS:  LEFT FEMORAL SHAFT FRACTURE.  PROCEDURES: RETROGRADE INTRAMEDULLARY NAILING OF THE LEFT FEMUR with [employer] 10.5 x 380  mm statically locked nail.  SURGEON:  Doralee Albino. Carola Frost, M.D.  ASSISTANT:  Montez Morita, PA-C.  ANESTHESIA:  General.  COMPLICATIONS:  None.  TOURNIQUET: None.  ESTIMATED BLOOD LOSS:  <100 mL.  DISPOSITION:  To PACU.  CONDITION:  Stable.  DELAY START OF DVT PROPHYLAXIS BECAUSE OF BLEEDING RISK: NO  BRIEF SUMMARY OF INDICATION FOR PROCEDURE:  [name] is a  67 y.o. who sustained left femur fracture in fall from his wheelchair resulting in a comminuted shaft and supracondylar fracture. I recommended retrograde nailing of the femur to facilitate early mobilization and range of motion. The risks and benefits of this operation were discussed with the patient including the possibilities of infection, nerve injury, vessel injury, DVT/ PE, loss of motion, arthritis, symptomatic hardware, and need for further surgery among others.  After full discussion, the patient gave consent to proceed.  BRIEF SUMMARY OF PROCEDURE:  After administration of preoperative antibiotics, the patient was taken to the operating room where general anesthesia was induced.  Careful positioning was performed with a bump placed under the left hip, and control of the fracture with distraction during positioning and prepping to prevent neurovascular injury. Standard prep and drape was performed with chlorhexidine scrub and wash initially, then Betadine scrub and paint.  Time-out was held and then the radiolucent triangle and towel bumps used to gain length and sagittal alignment with traction and flexion of the knee.  A 2.5 cm incision was made at the base of the patellar tendon, extending proximally, followed  with a medial parapatellar retinacular incision.  The sharp guide pin was placed directly anterior to Blumensaat's line in the middle of the condyle and advanced on AP and lateral projections into the center-center position of the distal femur. My assistant continued traction while I fine tuned the bumps and he also applied pressure with a mallet to produce coronal plane angulation.  The starting reamer was then used distally while protecting the soft tissues.  This was followed by introduction of the ball-tipped guidewire across the fracture site into the proximal femur up close to the piriformis.  Nail length was measured. Sequential reaming followed while maintaining reduction, encountering chatter at 11.5 mm and reaming up to 11.5 mm, placing a 10.5 x 380 mm nail.  After confirming appropriate seating of the nail beyond Blumensaat's line on the lateral, locking screws were placed distally off the guide, and checked on orthogonal images to confirm placement within the nail and appropriate length. Two proximal locks were then placed in static mode using perfect circle technique and a captured screwdriver.  These screws were also confirmed for length and position. At the conclusion of the procedure, I examined the knee for varus and valgus stability after fixation and did not identify significant instability. Also with my assistant, Montez Morita, PA-C, we then irrigated all wounds thoroughly and closed them in standard layered fashion, working simultaneously to reduce overall time in the OR. The wounds were dressed and a gently compressive wrap from the ankle to thigh was applied.  The patient was awakened and taken to the PACU in stable condition.    PROGNOSIS:  The patient will have unrestricted range of motion of the  knee and hip, and early mobilization will be encouraged with partial weight bearing.  DVT prophylaxis will be with Lovenox. Given the patients associated injuries and comorbidities the risk of  complications remains elevated which has been discussed with the family.    Doralee Albino. Carola Frost, M.D.

## 2023-05-02 NOTE — Plan of Care (Signed)

## 2023-05-02 NOTE — Progress Notes (Addendum)
Subjective:  [name] is a 67 year old male with history of peptic ulcer disease and recent hospitalization for upper GI bleed, admitted for evaluation of recurrent blood stools most likely secondary to gastric, duodenal ulcers.   Currently, the patient reports feeling well with reduced pain. Has not had a bowel movement since admission but denies abdominal pain. Has an appetite and tolerating a soft diet. Patient reports that prior to hospitalization, he had one bowel movement that had bright red blood. No repeat episodes or history of hematochezia. Per chart review, it appears that his last colonoscopy was >10 years ago.     Interval Events: - Sutures of left leg removed by orthopedic team yesterday.     Objective:  Vital Signs:   Temp:  99.1 F (37.3 C)  Pulse Rate:  [79-91] 88  Resp:  [17-20] 17 BP: (90-131)/(53-62) 117/62  SpO2:  [98 %-100 %] 99 % on RA  Last BM Date : 05/01/23  Physical Exam: General: Cachectic, lying in bed. No acute distress.   Lungs:  Normal respiratory effort on RA.  Heart: RRR. S1 and S2 normal without gallop, murmur, or rubs.  Abdomen:  BS normoactive. Soft, Nondistended, non-tender.  No masses or organomegaly.  Extremities: No pretibial edema. No sutures of left lower leg, no signs of infection (erythema, warmth, swelling, drainage).     Labs:  CBC: Recent Labs  Lab 04/30/23 1555 04/30/23 1614 04/30/23 2024 04/30/23 2324 05/01/23 0611 05/01/23 1755 05/02/23 0040  WBC 11.5*  --   --   --  13.0*  --  16.9*  NEUTROABS 8.4*  --   --   --   --   --   --   HGB 7.1*   < > 9.1* 9.6* 8.7* 8.7* 7.7*  HCT 23.2*   < > 27.7* 29.2* 27.2* 27.5* 23.8*  MCV 102.2*  --   --   --  96.8  --  96.7  PLT 355  --   --   --  257  --  247   < > = values in this interval not displayed.     Assessment & Plan:  Geremias Felton is a 67 year old male with history of peptic ulcer disease and recent hospitalization for upper GI bleed, admitted for evaluation of  recurrent blood stools.  #GI bleed, melena  Acute on chronic anemia  Patient's EGD on admission demonstrated non-bleeding gastric/duodenal ulcers, similar to prior. Has not had a bowel movement during hospitalization, which reduces concerns for ongoing large-volume bleed. However, his hemoglobin continues to downtrend post-transfusion (9.6>8.7>7.7). This trend in setting of prior bright red bloody BM increases our concern for lower GI bleed, and he may benefit from colonoscopy while admitted (last known colonoscopy >10 yrs ago). Also considered delayed hemolytic reaction given concomitant increase in WBC count. However, this is less likely as patient has no fever, chills, jaundice.  -Gastroenterology following, appreciate recommendations regarding colonoscopy  -Repeat H&H this afternoon -Continue Pantoprazole 40 mg PO BID (x12 weeks) -Continue liquid sucralfate 1 g TID -Hold NSAIDs, DOAC given esophagitis/ulcers   -Daily CBC, transfuse for Hgb <7 -PT following -Surveillance EGD in 12 weeks  -Follow-up pathology results (gastric ulcer)   #Muscle spasm  Leg, back pain Patient has a history of muscle spasms, back/leg pain. Continues to experience left leg pain following suture removal but notes that pain/spasms of back/leg have improved with medications. -Continue home gabapentin 800 mg PO TID  -Continue tizanidine 4 mg PO TID  -Continue IV dilaudid  0.5 mg q6h PRN for moderate pain (received 4 doses, including this morning)  #Sacral pressure ulcers Patient presents with multiple sacral pressure ulcers. Of note, patient has been bedridden since discharge on 04/10. Was seen by wound care with dressing changes, recommendations -- they have signed off.    Length of Stay: 2 day(s)   Signed:  Lyda Kalata, Medical Student 05/02/2023, 6:16 AM

## 2023-05-02 NOTE — Progress Notes (Addendum)
Progress Note  Primary GI: [facility]   Subjective  Chief Complaint: Acute GI bleed in setting of known peptic ulcer disease with hypotension/tachycardia  No family was present at the time of my evaluation. Patient lying in bed, no acute distress.  States he has not had any more bowel movements since Wednesday with large melenic bowel movement. No abdominal pain, no shortness of breath, no chest pain. He is complaining most about his left leg/hip pain.   Objective   Vital signs in last 24 hours: Temp:  [97 F (36.1 C)-99.1 F (37.3 C)] 99.1 F (37.3 C) (05/11 0535) Pulse Rate:  [81-91] 88 (05/11 0535) Resp:  [17-20] 17 (05/10 2032) BP: (90-131)/(53-62) 117/62 (05/11 0535) SpO2:  [98 %-100 %] 99 % (05/11 0535) Last BM Date : 05/01/23 Last BM recorded by nurses in past 5 days No data recorded  General: Chronically ill-appearing cachectic male in no acute distress Heart: Regular rate and rhythm Pulm: Clear anteriorly; Abdomen:  Soft, Non-distended AB, Active bowel sounds.  No tenderness.   Extremities: No edema, no erythema, no swelling, nontender.  Msk: Patient laying on right side, left medial thigh with well-healed scar.  Neurologic:  Alert and  oriented x4;  No focal deficits.  Skin: Dry and mucosa, Psychiatric:  Cooperative. Normal mood and affect.  Intake/Output from previous day: 05/10 0701 - 05/11 0700 In: 420 [P.O.:120; I.V.:300] Out: 250 [Urine:250] Intake/Output this shift: No intake/output data recorded.  Studies/Results: DG FEMUR PORT MIN 2 VIEWS LEFT  Result Date: 05/01/2023 CLINICAL DATA:  Fracture EXAM: LEFT FEMUR PORTABLE 2 VIEWS COMPARISON:  Postoperative radiograph 03/27/2023 FINDINGS: Patient had difficulty with positioning, imaging obtained in right lateral recumbent position. Intramedullary nail with proximal and distal locking screw fixation traversing comminuted distal femur fracture. There is slight increased displacement of a butterfly  fracture fragment medially, current displacement 14 mm, previous displacement of 9 mm. There is no significant callus formation or bony bridging. The fracture lines remain readily evident. IMPRESSION: Intramedullary nail with proximal and distal locking screw fixation traversing comminuted distal femur fracture. Slight increased displacement of a butterfly fracture fragment medially, current displacement of 14 mm. No significant callus formation or bony bridging. Electronically Signed   By: Narda Rutherford M.D.   On: 05/01/2023 18:15    Lab Results: Recent Labs    04/30/23 1555 04/30/23 1614 05/01/23 0611 05/01/23 1755 05/02/23 0040  WBC 11.5*  --  13.0*  --  16.9*  HGB 7.1*   < > 8.7* 8.7* 7.7*  HCT 23.2*   < > 27.2* 27.5* 23.8*  PLT 355  --  257  --  247   < > = values in this interval not displayed.   BMET Recent Labs    04/30/23 1555 04/30/23 1614 05/01/23 0611  NA 137 138 138  K 4.6 4.5 4.1  CL 109 108 107  CO2 20*  --  20*  GLUCOSE 131* 129* 80  BUN 60* 53* 46*  CREATININE 1.32* 1.40* 1.10  CALCIUM 8.0*  --  8.1*   LFT Recent Labs    04/30/23 1555  PROT 5.5*  ALBUMIN 2.3*  AST 17  ALT 11  ALKPHOS 81  BILITOT 0.5   PT/INR Recent Labs    04/30/23 1555  LABPROT 15.9*  INR 1.3*     Scheduled Meds:  sodium chloride   Intravenous Once   sodium chloride   Intravenous Once   feeding supplement  237 mL Oral BID BM  gabapentin  800 mg Oral TID   leptospermum manuka honey  1 Application Topical Daily   mirtazapine  15 mg Oral QHS   multivitamin with minerals  1 tablet Oral Daily   nutrition supplement (JUVEN)  1 packet Oral BID BM   pantoprazole  40 mg Oral BID AC   polyethylene glycol  17 g Oral Daily   sucralfate  1 g Oral TID WC & HS   tiZANidine  4 mg Oral TID   Continuous Infusions:    Patient profile:   67 year old male with history of hypertension, diabetes, hyperlipidemia, chronic pain, history of peptic ulcer disease presents with  large-volume melenic stool.   Impression/Plan:   Acute on chronic anemia after large-volume melenic stool  (please see picture in media)  No further bleeding at this time, patient did report bright red blood smearing and history of hemorrhoids previously. Patient was never on previous PPI from hospitalization EGD yesterday with Dr. Rhea Belton showed grade D esophagitis, 2 cm hiatal hernia, large nonbleeding gastric ulcer, nonbleeding duodenal ulcer Hemoglobin initially 7.1, went up to 9.1, was 8.7 yesterday and currently 7.7 with no further bleeding. Large event was clearly upper GI bleed that happened Wednesday, had elevation of BUN, clearly melenic stool.  Patient's had slight decrease in hemoglobin however I believe this may be dilutional versus equalizing.  Monitor CBC, transfuse to keep greater than 7 Continue diet, tolerating well PPI twice daily for at least 12 weeks, liquid Carafate as needed Pending pathology Will need follow-up EGD 12 weeks for gastric ulcer healing at digestive health patient's primary GI No NSAIDs Continue to hold DOAC If patient becomes transfusion dependent versus worsening rectal bleeding please notify GI services.  Leukocytosis Tmax 99.1 Uncertain etiology, has pressure ulcer, recent surgery complaining of continuing left leg pain, last chest x-ray 4/24 unremarkable.  Oxygen satting well.   Principal Problem:   Acute GI bleeding Active Problems:   Acute blood loss anemia   Melena   History of gastric ulcer   Pressure injury of sacral region, stage 2 (HCC)   Acute esophagitis   Gastric ulcer   Duodenal ulcer   Protein-calorie malnutrition, severe    LOS: 2 days   Doree Albee  05/02/2023, 8:31 AM

## 2023-05-03 ENCOUNTER — Encounter (HOSPITAL_COMMUNITY): Payer: Self-pay | Admitting: Internal Medicine

## 2023-05-03 DIAGNOSIS — L89150 Pressure ulcer of sacral region, unstageable: Secondary | ICD-10-CM | POA: Diagnosis not present

## 2023-05-03 DIAGNOSIS — K449 Diaphragmatic hernia without obstruction or gangrene: Secondary | ICD-10-CM | POA: Diagnosis not present

## 2023-05-03 DIAGNOSIS — K921 Melena: Secondary | ICD-10-CM | POA: Diagnosis not present

## 2023-05-03 DIAGNOSIS — D62 Acute posthemorrhagic anemia: Secondary | ICD-10-CM | POA: Diagnosis not present

## 2023-05-03 DIAGNOSIS — K250 Acute gastric ulcer with hemorrhage: Secondary | ICD-10-CM | POA: Diagnosis not present

## 2023-05-03 DIAGNOSIS — M62838 Other muscle spasm: Secondary | ICD-10-CM | POA: Diagnosis not present

## 2023-05-03 DIAGNOSIS — K2100 Gastro-esophageal reflux disease with esophagitis, without bleeding: Secondary | ICD-10-CM | POA: Diagnosis not present

## 2023-05-03 LAB — CBC
HCT: 29.2 % — ABNORMAL LOW (ref 39.0–52.0)
Hemoglobin: 9.4 g/dL — ABNORMAL LOW (ref 13.0–17.0)
MCH: 31.4 pg (ref 26.0–34.0)
MCHC: 32.2 g/dL (ref 30.0–36.0)
MCV: 97.7 fL (ref 80.0–100.0)
Platelets: 262 10*3/uL (ref 150–400)
RBC: 2.99 MIL/uL — ABNORMAL LOW (ref 4.22–5.81)
RDW: 16 % — ABNORMAL HIGH (ref 11.5–15.5)
WBC: 11.2 10*3/uL — ABNORMAL HIGH (ref 4.0–10.5)
nRBC: 0 % (ref 0.0–0.2)

## 2023-05-03 LAB — COMPREHENSIVE METABOLIC PANEL
ALT: 23 U/L (ref 0–44)
AST: 24 U/L (ref 15–41)
Albumin: 2.4 g/dL — ABNORMAL LOW (ref 3.5–5.0)
Alkaline Phosphatase: 92 U/L (ref 38–126)
Anion gap: 11 (ref 5–15)
BUN: 30 mg/dL — ABNORMAL HIGH (ref 8–23)
CO2: 21 mmol/L — ABNORMAL LOW (ref 22–32)
Calcium: 8.5 mg/dL — ABNORMAL LOW (ref 8.9–10.3)
Chloride: 105 mmol/L (ref 98–111)
Creatinine, Ser: 0.84 mg/dL (ref 0.61–1.24)
GFR, Estimated: >60 mL/min (ref >60)
Glucose, Bld: 144 mg/dL — ABNORMAL HIGH (ref 70–99)
Potassium: 4.1 mmol/L (ref 3.5–5.1)
Sodium: 137 mmol/L (ref 135–145)
Total Bilirubin: 0.7 mg/dL (ref 0.3–1.2)
Total Protein: 5.8 g/dL — ABNORMAL LOW (ref 6.5–8.1)

## 2023-05-03 MED ORDER — OXYCODONE HCL 5 MG PO TABS
5.0000 mg | ORAL_TABLET | Freq: Four times a day (QID) | ORAL | Status: DC | PRN
  Administered 2023-05-03 – 2023-05-04 (×5): 5 mg via ORAL
  Filled 2023-05-03 (×6): qty 1

## 2023-05-03 MED ORDER — METHOCARBAMOL 500 MG PO TABS
1000.0000 mg | ORAL_TABLET | Freq: Three times a day (TID) | ORAL | Status: DC
  Administered 2023-05-03 – 2023-05-04 (×5): 1000 mg via ORAL
  Filled 2023-05-03 (×6): qty 2

## 2023-05-03 NOTE — Progress Notes (Signed)
Progress Note  Primary GI: [facility]   Subjective  Chief Complaint: Acute GI bleed in setting of known peptic ulcer disease with hypotension/tachycardia  No family was present at the time of my evaluation. Patient lying in bed, no acute distress.   Patient eating well, no nausea vomiting abdominal pain. No bowel movements for 2 days, no evidence of bleeding. Continues to complain of bilateral leg pain down into his feet. Denies fever or chills.   Objective   Vital signs in last 24 hours: Temp:  [97.8 F (36.6 C)-98.8 F (37.1 C)] 98.4 F (36.9 C) (05/12 0932) Pulse Rate:  [70-90] 76 (05/12 0932) Resp:  [18] 18 (05/12 0932) BP: (118-145)/(65-77) 121/65 (05/12 0932) SpO2:  [98 %-100 %] 100 % (05/12 0932) Last BM Date : 05/01/23 Last BM recorded by nurses in past 5 days No data recorded  General: Chronically ill-appearing cachectic male in no acute distress Heart: Regular rate and rhythm Pulm: Clear anteriorly; Abdomen:  Soft, Non-distended AB, Active bowel sounds.  No tenderness.   Extremities: No edema, no erythema, no swelling, nontender.  Msk: Patient laying on right side, left medial thigh with well-healed scar.  Neurologic:  Alert and  oriented x4;  No focal deficits.  Skin: Dry and mucosa, Psychiatric:  Cooperative. Normal mood and affect.  Intake/Output from previous day: 05/11 0701 - 05/12 0700 In: 960 [P.O.:960] Out: 1650 [Urine:1650] Intake/Output this shift: Total I/O In: 240 [P.O.:240] Out: 350 [Urine:350]  Studies/Results: DG FEMUR PORT MIN 2 VIEWS LEFT  Result Date: 05/01/2023 CLINICAL DATA:  Fracture EXAM: LEFT FEMUR PORTABLE 2 VIEWS COMPARISON:  Postoperative radiograph 03/27/2023 FINDINGS: Patient had difficulty with positioning, imaging obtained in right lateral recumbent position. Intramedullary nail with proximal and distal locking screw fixation traversing comminuted distal femur fracture. There is slight increased displacement of a  butterfly fracture fragment medially, current displacement 14 mm, previous displacement of 9 mm. There is no significant callus formation or bony bridging. The fracture lines remain readily evident. IMPRESSION: Intramedullary nail with proximal and distal locking screw fixation traversing comminuted distal femur fracture. Slight increased displacement of a butterfly fracture fragment medially, current displacement of 14 mm. No significant callus formation or bony bridging. Electronically Signed   By: Narda Rutherford M.D.   On: 05/01/2023 18:15    Lab Results: Recent Labs    05/01/23 0611 05/01/23 1755 05/02/23 0040 05/03/23 0306  WBC 13.0*  --  16.9* 11.2*  HGB 8.7* 8.7* 7.7* 9.4*  HCT 27.2* 27.5* 23.8* 29.2*  PLT 257  --  247 262   BMET Recent Labs    04/30/23 1555 04/30/23 1614 05/01/23 0611 05/03/23 0306  NA 137 138 138 137  K 4.6 4.5 4.1 4.1  CL 109 108 107 105  CO2 20*  --  20* 21*  GLUCOSE 131* 129* 80 144*  BUN 60* 53* 46* 30*  CREATININE 1.32* 1.40* 1.10 0.84  CALCIUM 8.0*  --  8.1* 8.5*   LFT Recent Labs    05/03/23 0306  PROT 5.8*  ALBUMIN 2.4*  AST 24  ALT 23  ALKPHOS 92  BILITOT 0.7   PT/INR Recent Labs    04/30/23 1555  LABPROT 15.9*  INR 1.3*     Scheduled Meds:  sodium chloride   Intravenous Once   sodium chloride   Intravenous Once   feeding supplement  237 mL Oral BID BM   gabapentin  800 mg Oral TID   leptospermum manuka honey  1 Application Topical  Daily   mirtazapine  15 mg Oral QHS   multivitamin with minerals  1 tablet Oral Daily   nutrition supplement (JUVEN)  1 packet Oral BID BM   pantoprazole  40 mg Oral BID AC   polyethylene glycol  17 g Oral Daily   sucralfate  1 g Oral TID WC & HS   tiZANidine  4 mg Oral TID   Continuous Infusions:    Patient profile:   67 year old male with history of hypertension, diabetes, hyperlipidemia, chronic pain, history of peptic ulcer disease presents with large-volume melenic stool.    Impression/Plan:   Acute on chronic anemia after large-volume melenic stool  (please see picture in media)  No further bleeding at this time, patient did report bright red blood smearing and history of hemorrhoids. EGD yesterday with Dr. Rhea Belton showed grade D esophagitis, 2 cm hiatal hernia, large nonbleeding gastric ulcer, nonbleeding duodenal ulcer Hemoglobin initially 7.1-->9.1--> 8.7-->7.7  Today HGB at 9.4 with no further bleeding.  Continue diet No NSAIDs Continue to hold DOAC PPI twice daily for at least 12 weeks, liquid Carafate as needed Pending pathology, which [facility] will follow. Will need follow-up EGD 12 weeks for gastric ulcer healing at digestive health patient's primary GI [facility] will sign off.  Please contact us if we can be of any further assistance during this hospital stay.   Principal Problem:   Acute GI bleeding Active Problems:   Acute blood loss anemia   Melena   History of gastric ulcer   Pressure injury of sacral region, stage 2 (HCC)   Acute esophagitis   Gastric ulcer   Duodenal ulcer   Protein-calorie malnutrition, severe    LOS: 3 days   Doree Albee  05/03/2023, 9:43 AM

## 2023-05-03 NOTE — Plan of Care (Signed)
  Problem: Health Behavior/Discharge Planning: Goal: Ability to manage health-related needs will improve Outcome: Progressing   

## 2023-05-03 NOTE — Progress Notes (Signed)
Subjective:  Patient evaluated at the bedside. Endorses bilateral leg pain that is sharp and shooting, starts from the back and goes down posterior leg into the feet. Says current muscle relaxers not working much. Asking for narcotics, says he gets them longstanding from a pain clinic. Says he has some lightheadedness with sitting up. Denies CP or SOB. No evidence of GI bleeding that he has seen. No throat or stomach pain, eating well.  Objective:  Vital Signs:   Temp:  99.1 F (37.3 C)  Pulse Rate:  [79-91] 88  Resp:  [17-20] 17 BP: (90-131)/(53-62) 117/62  SpO2:  [98 %-100 %] 99 % on RA  Last BM Date : 05/01/23  Physical Exam: General: Cachectic, lying in bed. No acute distress.   Lungs:  Normal respiratory effort on RA, lctab.  Heart: RRR. S1 and S2 normal without gallop, murmur, or rubs.  Abdomen:  BS normoactive. Soft, Nondistended, non-tender.  No masses or organomegaly.  Extremities: Warm, dry, no LE edema    Labs:  CBC: Recent Labs  Lab 04/30/23 1555 04/30/23 1614 04/30/23 2324 05/01/23 0611 05/01/23 1755 05/02/23 0040 05/03/23 0306  WBC 11.5*  --   --  13.0*  --  16.9* 11.2*  NEUTROABS 8.4*  --   --   --   --   --   --   HGB 7.1*   < > 9.6* 8.7* 8.7* 7.7* 9.4*  HCT 23.2*   < > 29.2* 27.2* 27.5* 23.8* 29.2*  MCV 102.2*  --   --  96.8  --  96.7 97.7  PLT 355  --   --  257  --  247 262   < > = values in this interval not displayed.     Assessment & Plan:  [name] is a 67 year old male with history of peptic ulcer disease and recent hospitalization for upper GI bleed, admitted for evaluation of recurrent blood stools.  #GI bleed, melena  Acute on chronic anemia  Patient's EGD on admission demonstrated non-bleeding gastric/duodenal ulcers, similar to prior. Hgb stable from 7.7 to 9.4 with no further evidence of GI bleed. Remains HDS. Denies esophageal or epigastric pain and says he is tolerating food well. GI signed off this AM. -Continue Pantoprazole  40 mg PO BID (x12 weeks) -Continue liquid sucralfate 1 g TID -Hold NSAIDs, DOAC given esophagitis/ulcers   -Daily CBC, transfuse for Hgb <7 -Surveillance EGD in 12 weeks  -Follow-up pathology results (gastric ulcer)   #Muscle spasm  Leg, back pain Patient has a history of muscle spasms, back/leg pain in the setting of know degenerative lumbar disease and S/p L leg fracture 1 month ago. Continues to endorse bilateral sharp shooting pain from the back down the posterior leg into the feet as well as lower back pain. Sees a pain specialist for oxycodone. Says gabapentin helps a little. Tizanidine not working. Patient able to transfer out of wheel chair prior to leg fracture, no longer able to do this. Does not want to go to a SNF. At home only has his nephew who sees him twice daily. Currently working on [employer]. Do worry about him discharging without an aid as his sacral wounds will only worsen sitting in stool and urine at home. Will plan for discharge tomorrow with air mattress at home.  -Continue home gabapentin 800 mg PO TID  - stop tizanidine 4 mg PO TID , start robaxin 1g TID -start oxycodone 5mg  q6 hours -Continue IV dilaudid 0.5 mg q6h PRN  for moderate pain (received 4 doses, including this morning)  #Sacral pressure ulcers Patient presents with multiple sacral pressure ulcers. Of note, patient has been bedridden since discharge on 04/10. Was seen by wound care with dressing changes, recommendations -- they have signed off.    Length of Stay: 3 day(s)   Signed: Willette Cluster, MD Internal Medicine Resident, PGY-1 [facility]  Pager: [phone]

## 2023-05-04 ENCOUNTER — Encounter (HOSPITAL_COMMUNITY): Payer: Self-pay | Admitting: Infectious Diseases

## 2023-05-04 ENCOUNTER — Other Ambulatory Visit (HOSPITAL_COMMUNITY): Payer: Self-pay

## 2023-05-04 DIAGNOSIS — M62838 Other muscle spasm: Secondary | ICD-10-CM | POA: Diagnosis not present

## 2023-05-04 DIAGNOSIS — K922 Gastrointestinal hemorrhage, unspecified: Secondary | ICD-10-CM | POA: Diagnosis not present

## 2023-05-04 DIAGNOSIS — E46 Unspecified protein-calorie malnutrition: Secondary | ICD-10-CM | POA: Diagnosis not present

## 2023-05-04 DIAGNOSIS — G8220 Paraplegia, unspecified: Secondary | ICD-10-CM | POA: Insufficient documentation

## 2023-05-04 DIAGNOSIS — N179 Acute kidney failure, unspecified: Secondary | ICD-10-CM | POA: Diagnosis not present

## 2023-05-04 LAB — CBC
HCT: 25.9 % — ABNORMAL LOW (ref 39.0–52.0)
Hemoglobin: 8.2 g/dL — ABNORMAL LOW (ref 13.0–17.0)
MCH: 31.2 pg (ref 26.0–34.0)
MCHC: 31.7 g/dL (ref 30.0–36.0)
MCV: 98.5 fL (ref 80.0–100.0)
Platelets: 272 10*3/uL (ref 150–400)
RBC: 2.63 MIL/uL — ABNORMAL LOW (ref 4.22–5.81)
RDW: 16.2 % — ABNORMAL HIGH (ref 11.5–15.5)
WBC: 11.2 10*3/uL — ABNORMAL HIGH (ref 4.0–10.5)
nRBC: 0 % (ref 0.0–0.2)

## 2023-05-04 LAB — SURGICAL PATHOLOGY

## 2023-05-04 MED ORDER — PANTOPRAZOLE SODIUM 40 MG PO TBEC
40.0000 mg | DELAYED_RELEASE_TABLET | Freq: Two times a day (BID) | ORAL | 0 refills | Status: DC
  Filled 2023-05-04: qty 60, 30d supply, fill #0

## 2023-05-04 MED ORDER — SUCRALFATE 1 GM/10ML PO SUSP
1.0000 g | Freq: Three times a day (TID) | ORAL | 0 refills | Status: DC
  Filled 2023-05-04: qty 420, 11d supply, fill #0

## 2023-05-04 MED ORDER — METHOCARBAMOL 500 MG PO TABS
1000.0000 mg | ORAL_TABLET | Freq: Three times a day (TID) | ORAL | 0 refills | Status: AC
  Filled 2023-05-04: qty 180, 30d supply, fill #0

## 2023-05-04 NOTE — TOC Transition Note (Addendum)
Transition of Care [facility]) - CM/SW Discharge Note   Patient Details  Name: [name] MRN: [id number] Date of Birth: 01-Mar-1956  Transition of Care [facility]) CM/SW Contact:  Tom-Johnson, Hershal Coria, RN Phone Number: 05/04/2023, 1:54 PM   Clinical Narrative:     Patient is scheduled for discharge today.  Readmission Risk Assessment done. Home health info, Outpatient f/u, New patient establishment/hospital f/u and discharge instructions on AVS. Order for Air Overlay Mattress faxed to Katina Degree with [employer] [phone]). Marina Goodell states mattress will be delivered within 24-48 hrs. MD, patient and nephew notified.   Prescriptions sent to [facility] pharmacy and meds will be delivered to patient at bedside prior discharge. PT[facility] scheduled for transportation. No further TOC needs noted.        Final next level of care: Home w Home Health Services Barriers to Discharge: Barriers Resolved   Patient Goals and CMS Choice CMS Medicare.gov Compare Post Acute Care list provided to:: Patient Choice offered to / list presented to : Patient, Adult Children Aurther Loft, Clif[place] Custard)  Discharge Placement                  Patient to be transferred to facility by: PTAR Name of family member notified: Georga Kaufmann Patient and family notified of of transfer: 05/04/23  Discharge Plan and Services Additional resources added to the After Visit Summary for     Discharge Planning Services: CM Consult Post Acute Care Choice: Home Health          DME Arranged: Air overlay mattress DME Agency: [employer] Date DME Agency Contacted: 05/04/23 Time DME Agency Contacted: 1100 Representative spoke with at DME Agency: Katina Degree HH Arranged: PT, RN, Nurse's Aide HH Agency: [facility] Date [facility] Agency Contacted: 05/04/23 Time HH Agency Contacted: 1320 Representative spoke with at [place] Agency: Tresa Endo  Social Determinants of Health (SDOH) Interventions SDOH Screenings   Food  Insecurity: No Food Insecurity (05/01/2023)  Housing: Low Risk  (05/01/2023)  Transportation Needs: No Transportation Needs (05/01/2023)  Utilities: Not At Risk (05/01/2023)  Tobacco Use: High Risk (05/03/2023)     Readmission Risk Interventions    05/04/2023    1:50 PM 09/17/2020    3:07 PM  Readmission Risk Prevention Plan  Post Dischage Appt  Not Complete  Appt Comments  SNF placement  Medication Screening  Complete  Transportation Screening Complete Complete  PCP or Specialist Appt within 5-7 Days Complete   Home Care Screening Complete   Medication Review (RN CM) Referral to Pharmacy

## 2023-05-04 NOTE — Discharge Instructions (Signed)
You were hospitalized for blood loss anemia and found to have bleeding gastric ulcer similar to prior admission when the GI doctors performed upper endoscopy. Your blood counts were repleted and you were started on an acid supressing medication called protonix and a medication called sucralfate that can coat and protect the stomach and esophagus lining. It was recommended that you go to a rehab facility but given your support at home you were discharged home. We ordered an air mattress to help with your wound on your buttox. Your nephew was also working with social work to get a personal care aid at home. We also started you on robaxin for muscle spasms. You hould continue to see your pain medicine physician for oxycodone. At home please:  Take protonix 40mg  1 tablet twice daily for 12 weeks Take sucralfate four times daily with meals for 1-2 weeks Follow up with the internal medicine center on 05/11/2023 at 3:45 PM  It was a pleasure caring for you!

## 2023-05-04 NOTE — Discharge Summary (Signed)
Name: [name] MRN: [id number] DOB: 10/25/1956 67 y.o. PCP: Pcp, No  Date of Admission: 04/30/2023  3:34 PM Date of Discharge: 05/04/2023 Attending Physician: Gust Rung, DO  Discharge Diagnosis: 1. Principal Problem:   Acute GI bleeding Active Problems:   Acute blood loss anemia   Melena   History of gastric ulcer   Pressure injury of sacral region, stage 2 (HCC)   Acute esophagitis   Gastric ulcer   Duodenal ulcer   Protein-calorie malnutrition, severe   Paraplegia (HCC)    Discharge Medications: Allergies as of 05/04/2023       Reactions   Lipitor [atorvastatin Calcium] Other (See Comments)   Myalgias         Medication List     STOP taking these medications    Oxycodone HCl 10 MG Tabs   tiZANidine 4 MG tablet Commonly known as: ZANAFLEX       TAKE these medications    acetaminophen 325 MG tablet Commonly known as: TYLENOL Take 650 mg by mouth every 6 (six) hours as needed for moderate pain, fever or headache.   apixaban 2.5 MG Tabs tablet Commonly known as: ELIQUIS Take 1 tablet (2.5 mg total) by mouth 2 (two) times daily for 16 days.   docusate sodium 100 MG capsule Commonly known as: COLACE Take 100 mg by mouth at bedtime.   gabapentin 800 MG tablet Commonly known as: NEURONTIN Take 800 mg by mouth 3 (three) times daily.   losartan 25 MG tablet Commonly known as: COZAAR Take 25 mg by mouth daily.   methocarbamol 500 MG tablet Commonly known as: ROBAXIN Take 2 tablets (1,000 mg total) by mouth 3 (three) times daily.   mirtazapine 15 MG disintegrating tablet Commonly known as: REMERON SOL-TAB Take 1 tablet (15 mg total) by mouth at bedtime.   pantoprazole 40 MG tablet Commonly known as: PROTONIX Take 1 tablet (40 mg total) by mouth 2 (two) times daily before a meal.   polyethylene glycol powder 17 GM/SCOOP powder Commonly known as: GLYCOLAX/MIRALAX Take 17 g by mouth daily.   sucralfate 1 GM/10ML suspension Commonly  known as: CARAFATE Take 10 mLs (1 g total) by mouth 4 (four) times daily -  with meals and at bedtime.               Durable Medical Equipment  (From admission, onward)           Start     Ordered   05/03/23 0000  For home use only DME Air overlay mattress        05/03/23 0719              Discharge Care Instructions  (From admission, onward)           Start     Ordered   05/04/23 0000  Discharge wound care:       Comments: Per nursing/wound care   05/04/23 1154            Disposition and follow-up:   Mr.Ha B Babineaux was discharged from [facility] in Stable condition.  At the hospital follow up visit please address:  1.   GI bleed, melena  Acute on chronic anemia  -f/u EGD with GI 12 weeks at [facility])- ensure patient gets this -Protonix 40mg  BID x 12 weeks(started 5/10), ensure medication is refilled -sucralfate prescribe x2 weeks at discharge -repeat cbc  Sacral pressure wounds -ordered DME air mattress at discharge -re-evaluate wound  and refer to wound care if needed  Paraplegia -f/u on whether patient was able to get home health aid   2.  Labs / imaging needed at time of follow-up: cbc  3.  Pending labs/ test needing follow-up: NA  Follow-up Appointments:  05/11/2023 3:45 PM Doran Stabler, DO [facility]   Make sure you schedule an appointment to see your GI specialist in 12 weeks for repeat upper endoscopy.   Hospital Course by problem list: 1.  #GI bleed, melena  Acute on chronic anemia  [name] is a 67 year old male with history of peptic ulcer disease, recent hospitalization for GI bleed (4/25), and a-fib on eliquis who presented to ED with 3 days of recurrent bloody bowel movements likely secondary to upper GI bleed. Patient notes that stools have been mostly dark in quality; however, patient's nephew noted bright red blood upon wiping. He has also experienced  stomach pain, constipation with reduced PO intake for during this time. Patient has also been dizzy, diaphoretic occasionally, particularly when sitting up. Notes some chills, no fever. Patient also has a long history of leg cramps, muscle spasms managed with tylenol. Was previously on oxycodone for pain but weaned off 4 days prior to admission. CBC on admission was 7.1, patient received 2 units of blood. Repeat Hgb was 9.6. Patient was evaluated by GI team on admission. He was made NPO and received EGD on hospital day one, which demonstrated esophagitis, gastritis and non-bleeding gastric/duodenal ulcers -- similar to findings demonstrated on prior EGD (04/22) and the presumed source of patient's blood loss. Patient was started on PPI, liquid sucralfate while NSAIDs, DOACs were held. On hospital day two, patient's CBC was notable for a decreased Hgb of 7.7. Iron panel was unremarkable. Patient had not had a bowel movement since admission, and his risk for on-going large-volume bleed was low. He was advanced to a soft diet without issues and denied any epigastric pain, tenderness. On hospital day three and four, patient's hemoglobin stabilized to 9.4>8.2 and pain was well-controlled. Advised to continue PPI therapy (Pantoprazole 40 mg BID PO) for 12 weeks and liquid sucralfate (1 g TID) for 1-2 weeks. He should also receive a surveillance EGD in 12 weeks and pathology results for gastric ulcer also need to be reviewed when available. Resumed Eliquis at discharge given hgb stable at discharge with level of 8.2.Follow-up with outpatient [facility] clinic (5/20).         #Muscle spasm  Leg, back pain Patient has a history of muscle spasms, back/leg pain in the setting of know degenerative lumbar disease and s/p L leg fracture 1 month ago. His mobility is now very limited due to injury and he is no longer able to transfer out of a wheel chair. Started on home gabapentin (800 mg TID) and tizanidine (4 mg TID) and dilaudid  (0.5 mg q6h PRN) during admission with some improvement. However, patient continued to endorse bilateral sharp shooting pain from the back down the posterior leg into the feet as well as lower back pain. Notes that tizanidine did not provide relief. Tizanidine and dilaudid were discontinued and patient was started on robaxin (1 g TID) and oxycodone (5 mg q6h PRN). Prior to discharge, patient reported no pain. He sees a pain specialist for oxycodone. Continue home gabapentin and robaxin at discharge. PT/OT evaluated patient and recommended SNF. Medical team also recommended this but the patient declined. Will go home with support from his brother and nephew. Nephew working with  social work on Doctor, hospital.   #Sacral pressure ulcers Patient presents with multiple sacral pressure ulcers. Of note, patient has been bedridden since discharge on 04/10. At home his only form of support is a nephew who sees him twice daily. Currently working on getting a personal aide/home health services. Does not want to go to a SNF. We discuss the importance of having good support as his sacral wounds typically worsen sitting in stool and urine at home. Was seen by wound care with dressing changes -- team has provided recommendations during admission.   #AKI-resolved Patient's sCr elevated 1.40 on admission (above baseline ~1.00). AKI likely secondary to hypovolemia and improved with fluid resuscitation with sCr of 0.84 prior to discharge.   #Malnutrition BMI 20.09 on admission. Protein calorie malnutrition identified during last admission. Patient started on low-dose mirtazapine 50 mg nightly with good appetite prior to discharge. Was able to tolerate a soft diet. Seems to be a chronic problem. Continue mirtazapine 50 mg nightly.  # Type 2 diabetes Patient presents with history of type 2 diabetes. Last A1c 4.7. Glucose 129 on admission. He is not on medication. His insulin resistance may have improved with weight  loss.  Discharge Exam:   BP 111/61 (BP Location: Left Arm)   Pulse (!) 102   Temp 97.8 F (36.6 C) (Oral)   Resp 19   Ht 5\' 10"  (1.778 m)   Wt 63.5 kg   SpO2 90%   BMI 20.09 kg/m  Discharge exam:  General: Cachectic, lying in bed. No acute distress.   Lungs:  Normal respiratory effort on RA, lctab.  Heart: RRR. S1 and S2 normal without gallop, murmur, or rubs.  Abdomen:  BS normoactive. Soft, Nondistended, non-tender.  No masses or organomegaly.  Extremities: Warm, dry, no LE edema    Pertinent Labs, Studies, and Procedures:     Latest Ref Rng & Units 05/04/2023    1:54 AM 05/03/2023    3:06 AM 05/02/2023   12:40 AM  CBC  WBC 4.0 - 10.5 K/uL 11.2  11.2  16.9   Hemoglobin 13.0 - 17.0 g/dL 8.2  9.4  7.7   Hematocrit 39.0 - 52.0 % 25.9  29.2  23.8   Platelets 150 - 400 K/uL 272  262  247        Latest Ref Rng & Units 05/03/2023    3:06 AM 05/01/2023    6:11 AM 04/30/2023    4:14 PM  BMP  Glucose 70 - 99 mg/dL 161  80  096   BUN 8 - 23 mg/dL 30  46  53   Creatinine 0.61 - 1.24 mg/dL 0.45  4.09  8.11   Sodium 135 - 145 mmol/L 137  138  138   Potassium 3.5 - 5.1 mmol/L 4.1  4.1  4.5   Chloride 98 - 111 mmol/L 105  107  108   CO2 22 - 32 mmol/L 21  20    Calcium 8.9 - 10.3 mg/dL 8.5  8.1     DG FEMUR PORT MIN 2 VIEWS LEFT  Result Date: 05/01/2023 CLINICAL DATA:  Fracture EXAM: LEFT FEMUR PORTABLE 2 VIEWS COMPARISON:  Postoperative radiograph 03/27/2023 FINDINGS: Patient had difficulty with positioning, imaging obtained in right lateral recumbent position. Intramedullary nail with proximal and distal locking screw fixation traversing comminuted distal femur fracture. There is slight increased displacement of a butterfly fracture fragment medially, current displacement 14 mm, previous displacement of 9 mm. There is no significant callus formation  or bony bridging. The fracture lines remain readily evident. IMPRESSION: Intramedullary nail with proximal and distal locking screw  fixation traversing comminuted distal femur fracture. Slight increased displacement of a butterfly fracture fragment medially, current displacement of 14 mm. No significant callus formation or bony bridging. Electronically Signed   By: Narda Rutherford M.D.   On: 05/01/2023 18:15     Discharge Instructions: Discharge Instructions     Diet - low sodium heart healthy   Complete by: As directed    Discharge wound care:   Complete by: As directed    Per nursing/wound care   Face-to-face encounter (required for Medicare/Medicaid patients)   Complete by: As directed    I Willette Cluster certify that this patient is under my care and that I, or a nurse practitioner or physician's assistant working with me, had a face-to-face encounter that meets the physician face-to-face encounter requirements with this patient on 05/04/2023. The encounter with the patient was in whole, or in part for the following medical condition(s) which is the primary reason for home health care (List medical condition): paraplegia, sacral pressure wounds   The encounter with the patient was in whole, or in part, for the following medical condition, which is the primary reason for home health care: paraplegia   I certify that, based on my findings, the following services are medically necessary home health services:  Nursing Physical therapy     Reason for Medically Necessary Home Health Services:  Skilled Nursing- Complex Wound Care Therapy- Gait Training, Teacher, early years/pre and Stair Training Therapy- Home Adaptation to Facilitate Safety Therapy- Therapeutic Exercises to Increase Strength and Endurance     My clinical findings support the need for the above services:  Unsafe ambulation due to balance issues Bedbound Unable to leave home safely without assistance and/or assistive device     Further, I certify that my clinical findings support that this patient is homebound due to:  Unsafe ambulation due to balance  issues Open/draining pressure/stasis ulcer Unable to leave home safely without assistance     For home use only DME Air overlay mattress   Complete by: As directed    Home Health   Complete by: As directed    To provide the following care/treatments:  PT Home Health Aide     Increase activity slowly   Complete by: As directed       You were hospitalized for blood loss anemia and found to have bleeding gastric ulcer similar to prior admission when the GI doctors performed upper endoscopy. Your blood counts were repleted and you were started on an acid supressing medication called protonix and a medication called sucralfate that can coat and protect the stomach and esophagus lining. It was recommended that you go to a rehab facility but given your support at home you were discharged home. We ordered an air mattress to help with your wound on your buttox. Your nephew was also working with social work to get a personal care aid at home. We also started you on robaxin for muscle spasms. You hould continue to see your pain medicine physician for oxycodone. At home please:  Take protonix 40mg  1 tablet twice daily for 12 weeks Take sucralfate four times daily with meals for 1-2 weeks Follow up with the internal medicine center on 05/11/2023 at 3:45 PM  It was a pleasure caring for you!  Signed: Willette Cluster, MD 05/04/2023, 11:57 AM   Pager: Willette Cluster, MD Internal Medicine Resident, PGY-1 [facility]  Internal Medicine Residency  Pager: [phone]

## 2023-05-04 NOTE — Progress Notes (Signed)
Patient continues to have barriers with arranging home health according to patient's RN.   AVS has been completed and printed.  Will continue to monitor for discharge readiness.   Orvan Seen SWOT RN

## 2023-05-04 NOTE — Care Management Important Message (Signed)
Important Message  Patient Details  Name: [name] MRN: [id number] Date of Birth: [birth year]   Medicare Important Message Given:  Yes     [name] 05/04/2023, 3:03 PM

## 2023-05-04 NOTE — Progress Notes (Signed)
Patient given AVS summary. PTAR transported patient off the unit. Nephew,Aaron was contacted contacted per request for discharge notification. Patient and family understand discharge information and contact information following discharge.

## 2023-05-05 ENCOUNTER — Observation Stay (HOSPITAL_COMMUNITY)
Admission: EM | Admit: 2023-05-05 | Discharge: 2023-05-08 | Disposition: A | Discharge status: Skilled Nursing Facility | Payer: 59 | Attending: Internal Medicine | Admitting: Internal Medicine

## 2023-05-05 ENCOUNTER — Emergency Department (HOSPITAL_COMMUNITY): Payer: 59

## 2023-05-05 ENCOUNTER — Other Ambulatory Visit: Payer: Self-pay

## 2023-05-05 DIAGNOSIS — L89150 Pressure ulcer of sacral region, unstageable: Secondary | ICD-10-CM | POA: Insufficient documentation

## 2023-05-05 DIAGNOSIS — R5381 Other malaise: Secondary | ICD-10-CM | POA: Diagnosis present

## 2023-05-05 DIAGNOSIS — I10 Essential (primary) hypertension: Secondary | ICD-10-CM | POA: Diagnosis not present

## 2023-05-05 DIAGNOSIS — K922 Gastrointestinal hemorrhage, unspecified: Principal | ICD-10-CM | POA: Insufficient documentation

## 2023-05-05 DIAGNOSIS — E1165 Type 2 diabetes mellitus with hyperglycemia: Secondary | ICD-10-CM

## 2023-05-05 DIAGNOSIS — M62838 Other muscle spasm: Secondary | ICD-10-CM | POA: Insufficient documentation

## 2023-05-05 DIAGNOSIS — I159 Secondary hypertension, unspecified: Secondary | ICD-10-CM

## 2023-05-05 DIAGNOSIS — E119 Type 2 diabetes mellitus without complications: Secondary | ICD-10-CM | POA: Diagnosis not present

## 2023-05-05 DIAGNOSIS — R54 Age-related physical debility: Secondary | ICD-10-CM | POA: Diagnosis present

## 2023-05-05 DIAGNOSIS — K921 Melena: Secondary | ICD-10-CM | POA: Insufficient documentation

## 2023-05-05 DIAGNOSIS — R1084 Generalized abdominal pain: Secondary | ICD-10-CM | POA: Diagnosis present

## 2023-05-05 DIAGNOSIS — E440 Moderate protein-calorie malnutrition: Secondary | ICD-10-CM | POA: Diagnosis not present

## 2023-05-05 DIAGNOSIS — Z79899 Other long term (current) drug therapy: Secondary | ICD-10-CM | POA: Diagnosis not present

## 2023-05-05 DIAGNOSIS — F17210 Nicotine dependence, cigarettes, uncomplicated: Secondary | ICD-10-CM | POA: Insufficient documentation

## 2023-05-05 LAB — I-STAT CHEM 8, ED
BUN: 39 mg/dL — ABNORMAL HIGH (ref 8–23)
Calcium, Ion: 1.17 mmol/L (ref 1.15–1.40)
Chloride: 106 mmol/L (ref 98–111)
Creatinine, Ser: 1 mg/dL (ref 0.61–1.24)
Glucose, Bld: 189 mg/dL — ABNORMAL HIGH (ref 70–99)
HCT: 30 % — ABNORMAL LOW (ref 39.0–52.0)
Hemoglobin: 10.2 g/dL — ABNORMAL LOW (ref 13.0–17.0)
Potassium: 4.4 mmol/L (ref 3.5–5.1)
Sodium: 141 mmol/L (ref 135–145)
TCO2: 28 mmol/L (ref 22–32)

## 2023-05-05 LAB — CBC WITH DIFFERENTIAL/PLATELET
Abs Immature Granulocytes: 0.07 10*3/uL (ref 0.00–0.07)
Basophils Absolute: 0.1 10*3/uL (ref 0.0–0.1)
Basophils Relative: 1 %
Eosinophils Absolute: 0.3 10*3/uL (ref 0.0–0.5)
Eosinophils Relative: 3 %
HCT: 29.3 % — ABNORMAL LOW (ref 39.0–52.0)
Hemoglobin: 9 g/dL — ABNORMAL LOW (ref 13.0–17.0)
Immature Granulocytes: 1 %
Lymphocytes Relative: 15 %
Lymphs Abs: 1.8 10*3/uL (ref 0.7–4.0)
MCH: 30.8 pg (ref 26.0–34.0)
MCHC: 30.7 g/dL (ref 30.0–36.0)
MCV: 100.3 fL — ABNORMAL HIGH (ref 80.0–100.0)
Monocytes Absolute: 0.6 10*3/uL (ref 0.1–1.0)
Monocytes Relative: 5 %
Neutro Abs: 9.1 10*3/uL — ABNORMAL HIGH (ref 1.7–7.7)
Neutrophils Relative %: 75 %
Platelets: 387 10*3/uL (ref 150–400)
RBC: 2.92 MIL/uL — ABNORMAL LOW (ref 4.22–5.81)
RDW: 15.9 % — ABNORMAL HIGH (ref 11.5–15.5)
WBC: 12 10*3/uL — ABNORMAL HIGH (ref 4.0–10.5)
nRBC: 0 % (ref 0.0–0.2)

## 2023-05-05 LAB — COMPREHENSIVE METABOLIC PANEL
ALT: 18 U/L (ref 0–44)
AST: 29 U/L (ref 15–41)
Albumin: 2.8 g/dL — ABNORMAL LOW (ref 3.5–5.0)
Alkaline Phosphatase: 110 U/L (ref 38–126)
Anion gap: 9 (ref 5–15)
BUN: 28 mg/dL — ABNORMAL HIGH (ref 8–23)
CO2: 24 mmol/L (ref 22–32)
Calcium: 8.8 mg/dL — ABNORMAL LOW (ref 8.9–10.3)
Chloride: 105 mmol/L (ref 98–111)
Creatinine, Ser: 0.92 mg/dL (ref 0.61–1.24)
GFR, Estimated: >60 mL/min (ref >60)
Glucose, Bld: 188 mg/dL — ABNORMAL HIGH (ref 70–99)
Potassium: 4.2 mmol/L (ref 3.5–5.1)
Sodium: 138 mmol/L (ref 135–145)
Total Bilirubin: 0.8 mg/dL (ref 0.3–1.2)
Total Protein: 6.7 g/dL (ref 6.5–8.1)

## 2023-05-05 LAB — CBG MONITORING, ED: Glucose-Capillary: 84 mg/dL (ref 70–99)

## 2023-05-05 LAB — TYPE AND SCREEN
ABO/RH(D): B POS
Antibody Screen: NEGATIVE

## 2023-05-05 LAB — PROTIME-INR
INR: 1.1 (ref 0.8–1.2)
Prothrombin Time: 14.1 seconds (ref 11.4–15.2)

## 2023-05-05 LAB — I-STAT VENOUS BLOOD GAS, ED
Acid-Base Excess: 2 mmol/L (ref 0.0–2.0)
Bicarbonate: 28.3 mmol/L — ABNORMAL HIGH (ref 20.0–28.0)
Calcium, Ion: 1.18 mmol/L (ref 1.15–1.40)
HCT: 30 % — ABNORMAL LOW (ref 39.0–52.0)
Hemoglobin: 10.2 g/dL — ABNORMAL LOW (ref 13.0–17.0)
O2 Saturation: 66 %
Potassium: 4.4 mmol/L (ref 3.5–5.1)
Sodium: 140 mmol/L (ref 135–145)
TCO2: 30 mmol/L (ref 22–32)
pCO2, Ven: 51 mmHg (ref 44–60)
pH, Ven: 7.351 (ref 7.25–7.43)
pO2, Ven: 37 mmHg (ref 32–45)

## 2023-05-05 LAB — TROPONIN I (HIGH SENSITIVITY)
Troponin I (High Sensitivity): 8 ng/L (ref <18)
Troponin I (High Sensitivity): 9 ng/L (ref <18)

## 2023-05-05 LAB — POC OCCULT BLOOD, ED: Fecal Occult Bld: POSITIVE — AB

## 2023-05-05 LAB — LIPASE, BLOOD: Lipase: 27 U/L (ref 11–51)

## 2023-05-05 LAB — LACTIC ACID, PLASMA
Lactic Acid, Venous: 1.3 mmol/L (ref 0.5–1.9)
Lactic Acid, Venous: 1.2 mmol/L (ref 0.5–1.9)

## 2023-05-05 LAB — MAGNESIUM: Magnesium: 2.1 mg/dL (ref 1.7–2.4)

## 2023-05-05 MED ORDER — SUCRALFATE 1 GM/10ML PO SUSP
1.0000 g | Freq: Three times a day (TID) | ORAL | Status: DC
  Administered 2023-05-06 – 2023-05-08 (×11): 1 g via ORAL
  Filled 2023-05-05 (×11): qty 10

## 2023-05-05 MED ORDER — ACETAMINOPHEN 650 MG RE SUPP: 650.0000 mg | Freq: Four times a day (QID) | RECTAL | Status: DC | PRN

## 2023-05-05 MED ORDER — HYDROMORPHONE HCL 1 MG/ML IJ SOLN
0.5000 mg | Freq: Once | INTRAMUSCULAR | Status: AC
  Administered 2023-05-05: 0.5 mg via INTRAVENOUS
  Filled 2023-05-05: qty 1

## 2023-05-05 MED ORDER — LACTATED RINGERS IV BOLUS (SEPSIS)
500.0000 mL | Freq: Once | INTRAVENOUS | Status: AC
  Administered 2023-05-05: 500 mL via INTRAVENOUS

## 2023-05-05 MED ORDER — MIRTAZAPINE 15 MG PO TBDP
15.0000 mg | ORAL_TABLET | Freq: Every day | ORAL | Status: DC
  Administered 2023-05-06 – 2023-05-08 (×3): 15 mg via ORAL
  Filled 2023-05-05 (×4): qty 1

## 2023-05-05 MED ORDER — GABAPENTIN 400 MG PO CAPS
800.0000 mg | ORAL_CAPSULE | Freq: Three times a day (TID) | ORAL | Status: DC
  Administered 2023-05-05 – 2023-05-08 (×9): 800 mg via ORAL
  Filled 2023-05-05 (×7): qty 2
  Filled 2023-05-05: qty 8
  Filled 2023-05-05 (×2): qty 2

## 2023-05-05 MED ORDER — PANTOPRAZOLE SODIUM 40 MG IV SOLR
40.0000 mg | Freq: Two times a day (BID) | INTRAVENOUS | Status: DC
  Administered 2023-05-05 – 2023-05-07 (×3): 40 mg via INTRAVENOUS
  Filled 2023-05-05 (×3): qty 10

## 2023-05-05 MED ORDER — HYDROMORPHONE HCL 1 MG/ML IJ SOLN
1.0000 mg | Freq: Once | INTRAMUSCULAR | Status: AC
  Administered 2023-05-06: 1 mg via INTRAVENOUS
  Filled 2023-05-05: qty 1

## 2023-05-05 MED ORDER — IOHEXOL 350 MG/ML SOLN
75.0000 mL | Freq: Once | INTRAVENOUS | Status: AC | PRN
  Administered 2023-05-05: 75 mL via INTRAVENOUS

## 2023-05-05 MED ORDER — PANTOPRAZOLE SODIUM 40 MG PO TBEC: 40.0000 mg | DELAYED_RELEASE_TABLET | Freq: Two times a day (BID) | ORAL | Status: DC

## 2023-05-05 MED ORDER — METHOCARBAMOL 500 MG PO TABS
1000.0000 mg | ORAL_TABLET | Freq: Three times a day (TID) | ORAL | Status: DC
  Administered 2023-05-06 – 2023-05-08 (×9): 1000 mg via ORAL
  Filled 2023-05-05 (×9): qty 2

## 2023-05-05 MED ORDER — ACETAMINOPHEN 325 MG PO TABS
650.0000 mg | ORAL_TABLET | Freq: Four times a day (QID) | ORAL | Status: DC | PRN
  Administered 2023-05-06 – 2023-05-08 (×3): 650 mg via ORAL
  Filled 2023-05-05 (×3): qty 2

## 2023-05-05 NOTE — ED Triage Notes (Signed)
Pt is coming from home via EMS, a&ox4, warm and dry to touch. Pt is complaining of abdominal pain, leg pain and blood in stool. Pt denies cp/shob.

## 2023-05-05 NOTE — ED Provider Notes (Signed)
[place] EMERGENCY DEPARTMENT AT [facility] Provider Note   CSN: [id number] Arrival date & time: 05/05/23  1620     History  Chief Complaint  Patient presents with   Abdominal Pain    Pt coming from home with abdominal pain, leg pain and blood in stool.    [name] is a 67 y.o. male.   Abdominal Pain Patient presents for abdominal pain and bloody stool.  Medical history includes DM, deconditioning, bedbound at baseline, malnutrition, anemia, PUD, paraplegia.  He was admitted to the hospital 5 days ago for GI bleeding.  Initial hemoglobin at time of admission was 7.1.  He received 2 units of PRBCs.  He underwent EGD which showed esophagitis, gastritis, and nonbleeding gastric and duodenal ulcers.  He was started on PPI and liquid sulcal fate.  Eliquis was held.  Hemoglobin during hospitalization stabilized at 8.2.  He had an AKI on admission which also resolved.  He was discharged yesterday.  Patient presents today for recurrence of abdominal pain and blood in stool.  He states that he also has chronic bilateral leg pain.  He denies any recent nausea or vomiting.  He has been able to eat today.  His last bowel movement was shortly prior to arrival.     Home Medications Prior to Admission medications   Medication Sig Start Date End Date Taking? Authorizing Provider  acetaminophen (TYLENOL) 325 MG tablet Take 650 mg by mouth every 6 (six) hours as needed for moderate pain, fever or headache.    [provider]  apixaban (ELIQUIS) 2.5 MG TABS tablet Take 1 tablet (2.5 mg total) by mouth 2 (two) times daily for 16 days. Patient not taking: Reported on 04/30/2023 04/01/23 04/17/23  Morene Crocker, MD  docusate sodium (COLACE) 100 MG capsule Take 100 mg by mouth at bedtime.    [provider]  gabapentin (NEURONTIN) 800 MG tablet Take 800 mg by mouth 3 (three) times daily.    [provider]  losartan (COZAAR) 25 MG tablet Take 25 mg by  mouth daily.    [provider]  methocarbamol (ROBAXIN) 500 MG tablet Take 2 tablets (1,000 mg total) by mouth 3 (three) times daily. 05/04/23 06/03/23  Willette Cluster, MD  mirtazapine (REMERON SOL-TAB) 15 MG disintegrating tablet Take 1 tablet (15 mg total) by mouth at bedtime. 04/01/23 03/31/24  Morene Crocker, MD  pantoprazole (PROTONIX) 40 MG tablet Take 1 tablet (40 mg total) by mouth 2 (two) times daily before a meal. 05/04/23 06/03/23  Willette Cluster, MD  polyethylene glycol powder (GLYCOLAX/MIRALAX) 17 GM/SCOOP powder Take 17 g by mouth daily.    [provider]  sucralfate (CARAFATE) 1 GM/10ML suspension Take 10 mLs (1 g total) by mouth 4 (four) times daily -  with meals and at bedtime. 05/04/23   Willette Cluster, MD      Allergies    Lipitor [atorvastatin calcium]    Review of Systems   Review of Systems  Gastrointestinal:  Positive for abdominal pain and blood in stool.  All other systems reviewed and are negative.   Physical Exam Updated Vital Signs BP 108/63   Pulse 80   Temp 98.5 F (36.9 C) (Rectal)   Resp 14   Ht 5\' 10"  (1.778 m)   Wt 63.5 kg   SpO2 100%   BMI 20.09 kg/m  Physical Exam Vitals and nursing note reviewed.  Constitutional:      General: He is not in acute distress.  Appearance: He is well-developed. He is ill-appearing (Chronically). He is not diaphoretic.  HENT:     Head: Normocephalic and atraumatic.     Mouth/Throat:     Mouth: Mucous membranes are moist.  Eyes:     Extraocular Movements: Extraocular movements intact.     Conjunctiva/sclera: Conjunctivae normal.  Cardiovascular:     Rate and Rhythm: Normal rate and regular rhythm.     Heart sounds: No murmur heard. Pulmonary:     Effort: Pulmonary effort is normal. No respiratory distress.     Breath sounds: Normal breath sounds. No wheezing or rales.  Chest:     Chest wall: No tenderness.  Abdominal:     Palpations: Abdomen is soft.     Tenderness: There is  generalized abdominal tenderness. There is no guarding or rebound.  Musculoskeletal:        General: No swelling.     Cervical back: Neck supple.  Skin:    General: Skin is warm and dry.     Capillary Refill: Capillary refill takes less than 2 seconds.     Coloration: Skin is not cyanotic or jaundiced.  Neurological:     General: No focal deficit present.     Mental Status: He is alert and oriented to person, place, and time.  Psychiatric:        Mood and Affect: Mood normal.        Behavior: Behavior normal.     ED Results / Procedures / Treatments   Labs (all labs ordered are listed, but only abnormal results are displayed) Labs Reviewed  COMPREHENSIVE METABOLIC PANEL - Abnormal; Notable for the following components:      Result Value   Glucose, Bld 188 (*)    BUN 28 (*)    Calcium 8.8 (*)    Albumin 2.8 (*)    All other components within normal limits  CBC WITH DIFFERENTIAL/PLATELET - Abnormal; Notable for the following components:   WBC 12.0 (*)    RBC 2.92 (*)    Hemoglobin 9.0 (*)    HCT 29.3 (*)    MCV 100.3 (*)    RDW 15.9 (*)    Neutro Abs 9.1 (*)    All other components within normal limits  I-STAT CHEM 8, ED - Abnormal; Notable for the following components:   BUN 39 (*)    Glucose, Bld 189 (*)    Hemoglobin 10.2 (*)    HCT 30.0 (*)    All other components within normal limits  I-STAT VENOUS BLOOD GAS, ED - Abnormal; Notable for the following components:   Bicarbonate 28.3 (*)    HCT 30.0 (*)    Hemoglobin 10.2 (*)    All other components within normal limits  POC OCCULT BLOOD, ED - Abnormal; Notable for the following components:   Fecal Occult Bld POSITIVE (*)    All other components within normal limits  CULTURE, BLOOD (ROUTINE X 2)  CULTURE, BLOOD (ROUTINE X 2)  LACTIC ACID, PLASMA  LACTIC ACID, PLASMA  PROTIME-INR  LIPASE, BLOOD  MAGNESIUM  URINALYSIS, W/ REFLEX TO CULTURE (INFECTION SUSPECTED)  CBG MONITORING, ED  TYPE AND SCREEN  TROPONIN I  (HIGH SENSITIVITY)  TROPONIN I (HIGH SENSITIVITY)    EKG EKG Interpretation  Date/Time:  Tuesday May 05 2023 17:50:00 EDT Ventricular Rate:  88 PR Interval:  113 QRS Duration: 89 QT Interval:  378 QTC Calculation: 458 R Axis:   90 Text Interpretation: Sinus rhythm Borderline short PR interval Borderline right axis  deviation Confirmed by Gloris Manchester [phone]) on 05/05/2023 6:04:30 PM  Radiology CT ABDOMEN PELVIS W CONTRAST  Result Date: 05/05/2023 CLINICAL DATA:  Generalized abdominal pain. EXAM: CT ABDOMEN AND PELVIS WITH CONTRAST TECHNIQUE: Multidetector CT imaging of the abdomen and pelvis was performed using the standard protocol following bolus administration of intravenous contrast. RADIATION DOSE REDUCTION: This exam was performed according to the departmental dose-optimization program which includes automated exposure control, adjustment of the mA and/or kV according to patient size and/or use of iterative reconstruction technique. CONTRAST:  75mL OMNIPAQUE IOHEXOL 350 MG/ML SOLN COMPARISON:  CT chest, abdomen and pelvis with IV contrast 09/11/2020. FINDINGS: Technical note: Pelvic imaging is limited due to extensive beam hardening and streak artifacts related patient's overlying arms, and due to left pelvic and left femoral shaft fracture fixation hardware. In addition, the study was performed with patient positioned right posterior oblique. Lower chest: Lung bases again show emphysematous changes and multifocal cystic changes in the right-greater-than-left lower lobes, base of the right middle lobe. Mild chronic elevation right hemidiaphragm. Asymmetric right lower lobe atelectasis related to positioning. The cardiac size is normal. There is a small chronic pericardial effusion anteriorly and chronic three-vessel coronary artery calcific plaques. Hepatobiliary: There are stable scattered subcentimeter hepatic cysts. There is no mass enhancement of the liver. The gallbladder and bile ducts are  unremarkable. Pancreas: Partially atrophic.  Otherwise unremarkable. Spleen: No abnormality. Adrenals/Urinary Tract: There is mild adrenal hyperplasia. There is no adrenal mass. There is a stable 1.8 cm homogeneous thin walled cyst in the superior pole right kidney. There are additional few scattered stable less than 5 mm hypodensities in the bilateral renal cortex which are too small to characterize but most likely cysts. No follow-up imaging is recommended. For reference see JACR 2018 Feb; 264-273, Management of the Incidental Renal Mass on CT, [employer] 2021; 814-848, Bosniak Classification of Cystic Renal Masses, Version 2019. There is no renal mass enhancement and no urinary stone or obstruction. The bladder is not well seen due to pelvic spray and streak artifacts but it is normal in thickness where visible. Stomach/Bowel: There are moderate diffuse thickened folds in the stomach. The small bowel is normal caliber. A 3.2 cm diverticulum is again noted of the third portion of the duodenum just below the neck and body junction of the pancreas. The appendix was previously present and normal. I do not see it on today's scan probably due to extensive pelvic imaging artifacts obscuring it. There is no obvious right lower quadrant inflammatory process. There is moderate to severe fecal stasis. There colonic diverticulosis greatest in the sigmoid without obvious findings of acute diverticulitis although a proximal sigmoid diverticulitis would be obscured due to metal artifacts. Vascular/Lymphatic: There are heavy aortoiliac calcific plaques. IVC filter chronically noted. No AAA is seen. There is less significant visceral branch vessel atherosclerosis. No lymphadenopathy is seen. Reproductive: Enlarged prostate 4.9 cm transverse axis with dystrophic calcifications posteriorly. Size similar to 2021. Other: There is no free air, free hemorrhage or free fluid. Multiple pelvic phleboliths. There are no incarcerated  hernias. Musculoskeletal: Left pelvic fracture fixation plating is again noted with new demonstration of a partially visible left femoral intramedullary fracture fixation rod. There are bridging syndesmophytes and bridging enthesopathy of the thoracolumbar spine throughout. There is osteopenia with a spectrum of degenerative changes of the left-greater-than-right hips. No acute or other significant osseous findings There are bilaterally ankylosed SI joints and lumbar facet hypertrophy. IMPRESSION: 1. No acute inflammatory process is seen in the abdomen  or pelvis. Please note however, assessment for sigmoid or other pelvic inflammatory changes is very limited on this study. 2. Diverticulosis with moderate to severe fecal stasis. No bowel obstruction. 3. Gastroenteritis. 4. Prostatomegaly. 5. Aortic and coronary artery atherosclerosis. 6. Left pelvic chronic plating with new demonstration of a partially visible left femoral intramedullary rod. 7. Bridging syndesmophytes and bridging enthesopathy of the thoracolumbar spine, with ankylosed SI joints. Findings could be due to ankylosing spondylitis or other seronegative spondyloarthropathy. 8. Emphysema and cystic changes in the lung bases. Aortic Atherosclerosis (ICD10-I70.0) and Emphysema (ICD10-J43.9). Electronically Signed   By: Almira Bar M.D.   On: 05/05/2023 22:17   DG Chest Port 1 View  Result Date: 05/05/2023 CLINICAL DATA:  Sepsis EXAM: PORTABLE CHEST 1 VIEW COMPARISON:  X-ray 03/26/2023 FINDINGS: Hyperinflation. No consolidation, pneumothorax or effusion. No edema. Normal cardiopericardial silhouette. Overlapping cardiac leads. Fixation hardware along the lower cervical spine at the edge of the imaging field. IMPRESSION: Hyperinflation.  No acute cardiopulmonary disease. Electronically Signed   By: Karen Kays M.D.   On: 05/05/2023 17:07    Procedures Procedures    Medications Ordered in ED Medications  pantoprazole (PROTONIX) injection 40 mg  (40 mg Intravenous Given 05/05/23 2156)  gabapentin (NEURONTIN) capsule 800 mg (has no administration in time range)  sucralfate (CARAFATE) 1 GM/10ML suspension 1 g (has no administration in time range)  mirtazapine (REMERON SOL-TAB) disintegrating tablet 15 mg (has no administration in time range)  lactated ringers bolus 500 mL (0 mLs Intravenous Stopped 05/05/23 1844)  HYDROmorphone (DILAUDID) injection 0.5 mg (0.5 mg Intravenous Given 05/05/23 1747)  HYDROmorphone (DILAUDID) injection 0.5 mg (0.5 mg Intravenous Given 05/05/23 1858)  iohexol (OMNIPAQUE) 350 MG/ML injection 75 mL (75 mLs Intravenous Contrast Given 05/05/23 2139)    ED Course/ Medical Decision Making/ A&P                             Medical Decision Making Amount and/or Complexity of Data Reviewed Labs: ordered. Radiology: ordered. ECG/medicine tests: ordered.  Risk Prescription drug management.   This patient presents to the ED for concern of abdominal pain, this involves an extensive number of treatment options, and is a complaint that carries with it a high risk of complications and morbidity.  The differential diagnosis includes infection, colitis, PUD, enteritis, pancreatitis, SBO, mesenteric ischemia   Co morbidities that complicate the patient evaluation  DM, deconditioning, bedbound at baseline, malnutrition, anemia, PUD, paraplegia   Additional history obtained:  Additional history obtained from EMS External records from outside source obtained and reviewed including EMR   Lab Tests:  I Ordered, and personally interpreted labs.  The pertinent results include: Anemia and leukocytosis are consistent with recent prior lab work; kidney function and electrolytes are normal; troponin, lipase, lactate, and hepatobiliary enzymes are normal.   Imaging Studies ordered:  I ordered imaging studies including chest x-ray, CT of abdomen and pelvis I independently visualized and interpreted imaging which showed no  acute findings on x-ray.  CT scan showed no acute inflammatory process. I agree with the radiologist interpretation   Cardiac Monitoring: / EKG:  The patient was maintained on a cardiac monitor.  I personally viewed and interpreted the cardiac monitored which showed an underlying rhythm of: Sinus rhythm   Consultations Obtained:  I requested consultation with the gastroenterologist, Dr. Leonides Schanz,  and discussed lab and imaging findings as well as pertinent plan - they recommend: N.p.o. at midnight.  GI will  see in the morning and determine plan.   Problem List / ED Course / Critical interventions / Medication management  Patient presents for abdominal pain and blood in stool.  He was recently hospitalized for GI bleed.  During the hospitalization, he received 2 units PRBCs and underwent EGD which did not show any active bleeding.  He was discharged yesterday.  On arrival, patient is chronically ill-appearing.  Blood pressures are soft.  Vital signs are otherwise normal.  He has chronic skin breakdown to his gluteal region secondary to bedbound status.  He is abdomen is soft but he does endorse a mild generalized tenderness.  Current abdominal pain is 10/10 in severity.  Pain medication was ordered.  Workup was initiated.  Lab work is unremarkable.  Patient has not had a hemoglobin drop since his recent prior lab work.  A mild leukocytosis is present and this is consistent with his recent hospitalization.  Rectal temperature and exam were performed with nurse chaperone present.  Patient remains afebrile.  Melanotic stool is present.  Patient was started on PPI in the ED.  He was given additional Dilaudid for ongoing abdominal pain.  CT of abdomen pelvis showed no acute inflammatory process.  I spoke with gastroenterologist, Dr. Leonides Schanz, who states that GI will see in consult.  They advise keeping n.p.o. at midnight.  Patient was admitted to medicine for further management. I ordered medication including  Dilaudid for analgesia; IV fluid for hydration; Protonix for empiric treatment of UGIB Reevaluation of the patient after these medicines showed that the patient improved I have reviewed the patients home medicines and have made adjustments as needed   Social Determinants of Health:  Bedbound at baseline         Final Clinical Impression(s) / ED Diagnoses Final diagnoses:  Melena  Generalized abdominal pain    Rx / DC Orders ED Discharge Orders     None         Gloris Manchester, MD 05/05/23 2313

## 2023-05-05 NOTE — H&P (Incomplete)
Date: 05/06/2023               Patient Name:  [name] MRN: [id number]  DOB: May 06, 1956 Age / Sex: 67 y.o., male   PCP: Pcp, No         Medical Service: Internal Medicine Teaching Service         Attending Physician: Dr. Ginnie Smart, MD    First Contact: Willette Cluster, MD Pager: TR [phone]  Second Contact: Rudene Christians, DO Pager: Milinda Pointer [phone]       After Hours (After 5p/  First Contact Pager: [phone]  weekends / holidays): Second Contact Pager: [phone]   SUBJECTIVE   Chief Complaint: abdominal pain  History of Present Illness: [name] is a 67 year old male with hypertension, hyperlipidemia, diabetes, history of peptic ulcer disease, and recent admission for upper GI bleed who presented to the ED for evaluation of recurrence of bloody stools and abdominal pain.  Patient states that his bloody stools had subsided by the time he left the hospital yesterday and he had had some normal bowel movements.  Unfortunately, this morning he started having dark stools again and is also develop some upper abdominal pain.  He denies any pain with eating.  Denies any dizziness or lightheadedness.  He reports he is very hungry and has not eaten all day.  Denies any bright red blood in his stool.  Denies nausea or vomiting.  Continues to endorse a significant amount of pain in his legs when they spasm.  There is a sharp shooting pain that goes all the way down both legs.  He states is a chronic problem and has gone at least since his recent femur surgery.  Reports that his home pain regimen is inadequate.  Meds:  Tylenol 3 25 mg every 6 hours as needed Docusate sodium 100 mg daily at bedtime Gabapentin 800 mg 3 times daily Losartan 25 mg daily Methocarbamol 500 mg 2 tablets 3 times daily Mirtazapine 15 mg nightly Pantoprazole 40 mg twice daily MiraLAX 17 g daily Sucralfate 10 mL 4 times daily with meals and at bedtime  Past Medical History:  Diagnosis Date   Cellulitis  07/17/2020   LEFT HIP   Diabetes mellitus    Dyspnea    Hyperlipidemia    Hypertension     Past Surgical History:  Procedure Laterality Date   BIOPSY  05/01/2023   Procedure: BIOPSY;  Surgeon: Beverley Fiedler, MD;  Location: [facility] ENDOSCOPY;  Service: Gastroenterology;;   ESOPHAGOGASTRODUODENOSCOPY (EGD) WITH PROPOFOL N/A 05/01/2023   Procedure: ESOPHAGOGASTRODUODENOSCOPY (EGD) WITH PROPOFOL;  Surgeon: Beverley Fiedler, MD;  Location: [facility] ENDOSCOPY;  Service: Gastroenterology;  Laterality: N/A;   FEMUR IM NAIL Left 03/27/2023   Procedure: INTRAMEDULLARY (IM) RETROGRADE FEMORAL NAILING;  Surgeon: Myrene Galas, MD;  Location: [facility] OR;  Service: Orthopedics;  Laterality: Left;   SPINE SURGERY      Social:  Lives alone at home, nephew Harlan Stains comes twice a day. Is bedbound at home. Smokes tobacco 1/2 PPD. Has smoked since he was a teenager.  Has good support via his nephew.  Bedbound since last discharge.   Does not have PCP.  Family History: No pertinent family history.  Allergies: Allergies as of 05/05/2023 - Review Complete 05/05/2023  Allergen Reaction Noted   Lipitor [atorvastatin calcium] Other (See Comments) 01/28/2012    Review of Systems: A complete ROS was negative except as per HPI.   OBJECTIVE:   Physical Exam: Blood pressure 108/63, pulse 80, temperature 98.5  F (36.9 C), temperature source Rectal, resp. rate 14, height 5\' 10"  (1.778 m), weight 63.5 kg, SpO2 100 %.  Constitutional: Chronically ill-appearing male sitting in hospital bed, in no acute distress, room smells of urine, tongue pallor HENT: normocephalic atraumatic, mucous membranes moist Eyes: conjunctiva non-erythematous Neck: supple Cardiovascular: regular rate and rhythm, no m/r/g Pulmonary/Chest: normal work of breathing on room air, lungs clear anteriorly Abdominal: soft, nondistended, epigastric tenderness noted MSK: normal bulk and tone Neurological: alert & oriented x 3 Skin: warm and dry Psych:  Frustrated  Labs: CBC    Component Value Date/Time   WBC 12.0 (H) 05/05/2023 1720   RBC 2.92 (L) 05/05/2023 1720   HGB 10.2 (L) 05/05/2023 1751   HGB 10.2 (L) 05/05/2023 1751   HCT 30.0 (L) 05/05/2023 1751   HCT 30.0 (L) 05/05/2023 1751   PLT 387 05/05/2023 1720   MCV 100.3 (H) 05/05/2023 1720   MCV 87.3 01/28/2012 1409   MCH 30.8 05/05/2023 1720   MCHC 30.7 05/05/2023 1720   RDW 15.9 (H) 05/05/2023 1720   LYMPHSABS 1.8 05/05/2023 1720   MONOABS 0.6 05/05/2023 1720   EOSABS 0.3 05/05/2023 1720   BASOSABS 0.1 05/05/2023 1720     CMP     Component Value Date/Time   NA 141 05/05/2023 1751   NA 140 05/05/2023 1751   K 4.4 05/05/2023 1751   K 4.4 05/05/2023 1751   CL 106 05/05/2023 1751   CO2 24 05/05/2023 1720   GLUCOSE 189 (H) 05/05/2023 1751   BUN 39 (H) 05/05/2023 1751   CREATININE 1.00 05/05/2023 1751   CREATININE 1.09 01/28/2012 1406   CALCIUM 8.8 (L) 05/05/2023 1720   PROT 6.7 05/05/2023 1720   ALBUMIN 2.8 (L) 05/05/2023 1720   AST 29 05/05/2023 1720   ALT 18 05/05/2023 1720   ALKPHOS 110 05/05/2023 1720   BILITOT 0.8 05/05/2023 1720   GFRNONAA >60 05/05/2023 1720   GFRAA >60 09/12/2020 1500    Imaging: CT ABDOMEN PELVIS W CONTRAST  Result Date: 05/05/2023 CLINICAL DATA:  Generalized abdominal pain. EXAM: CT ABDOMEN AND PELVIS WITH CONTRAST TECHNIQUE: Multidetector CT imaging of the abdomen and pelvis was performed using the standard protocol following bolus administration of intravenous contrast. RADIATION DOSE REDUCTION: This exam was performed according to the departmental dose-optimization program which includes automated exposure control, adjustment of the mA and/or kV according to patient size and/or use of iterative reconstruction technique. CONTRAST:  75mL OMNIPAQUE IOHEXOL 350 MG/ML SOLN COMPARISON:  CT chest, abdomen and pelvis with IV contrast 09/11/2020. FINDINGS: Technical note: Pelvic imaging is limited due to extensive beam hardening and streak  artifacts related patient's overlying arms, and due to left pelvic and left femoral shaft fracture fixation hardware. In addition, the study was performed with patient positioned right posterior oblique. Lower chest: Lung bases again show emphysematous changes and multifocal cystic changes in the right-greater-than-left lower lobes, base of the right middle lobe. Mild chronic elevation right hemidiaphragm. Asymmetric right lower lobe atelectasis related to positioning. The cardiac size is normal. There is a small chronic pericardial effusion anteriorly and chronic three-vessel coronary artery calcific plaques. Hepatobiliary: There are stable scattered subcentimeter hepatic cysts. There is no mass enhancement of the liver. The gallbladder and bile ducts are unremarkable. Pancreas: Partially atrophic.  Otherwise unremarkable. Spleen: No abnormality. Adrenals/Urinary Tract: There is mild adrenal hyperplasia. There is no adrenal mass. There is a stable 1.8 cm homogeneous thin walled cyst in the superior pole right kidney. There are additional few scattered  stable less than 5 mm hypodensities in the bilateral renal cortex which are too small to characterize but most likely cysts. No follow-up imaging is recommended. For reference see JACR 2018 Feb; 264-273, Management of the Incidental Renal Mass on CT, [employer] 2021; 814-848, Bosniak Classification of Cystic Renal Masses, Version 2019. There is no renal mass enhancement and no urinary stone or obstruction. The bladder is not well seen due to pelvic spray and streak artifacts but it is normal in thickness where visible. Stomach/Bowel: There are moderate diffuse thickened folds in the stomach. The small bowel is normal caliber. A 3.2 cm diverticulum is again noted of the third portion of the duodenum just below the neck and body junction of the pancreas. The appendix was previously present and normal. I do not see it on today's scan probably due to extensive pelvic  imaging artifacts obscuring it. There is no obvious right lower quadrant inflammatory process. There is moderate to severe fecal stasis. There colonic diverticulosis greatest in the sigmoid without obvious findings of acute diverticulitis although a proximal sigmoid diverticulitis would be obscured due to metal artifacts. Vascular/Lymphatic: There are heavy aortoiliac calcific plaques. IVC filter chronically noted. No AAA is seen. There is less significant visceral branch vessel atherosclerosis. No lymphadenopathy is seen. Reproductive: Enlarged prostate 4.9 cm transverse axis with dystrophic calcifications posteriorly. Size similar to 2021. Other: There is no free air, free hemorrhage or free fluid. Multiple pelvic phleboliths. There are no incarcerated hernias. Musculoskeletal: Left pelvic fracture fixation plating is again noted with new demonstration of a partially visible left femoral intramedullary fracture fixation rod. There are bridging syndesmophytes and bridging enthesopathy of the thoracolumbar spine throughout. There is osteopenia with a spectrum of degenerative changes of the left-greater-than-right hips. No acute or other significant osseous findings There are bilaterally ankylosed SI joints and lumbar facet hypertrophy. IMPRESSION: 1. No acute inflammatory process is seen in the abdomen or pelvis. Please note however, assessment for sigmoid or other pelvic inflammatory changes is very limited on this study. 2. Diverticulosis with moderate to severe fecal stasis. No bowel obstruction. 3. Gastroenteritis. 4. Prostatomegaly. 5. Aortic and coronary artery atherosclerosis. 6. Left pelvic chronic plating with new demonstration of a partially visible left femoral intramedullary rod. 7. Bridging syndesmophytes and bridging enthesopathy of the thoracolumbar spine, with ankylosed SI joints. Findings could be due to ankylosing spondylitis or other seronegative spondyloarthropathy. 8. Emphysema and cystic  changes in the lung bases. Aortic Atherosclerosis (ICD10-I70.0) and Emphysema (ICD10-J43.9). Electronically Signed   By: Almira Bar M.D.   On: 05/05/2023 22:17   DG Chest Port 1 View  Result Date: 05/05/2023 CLINICAL DATA:  Sepsis EXAM: PORTABLE CHEST 1 VIEW COMPARISON:  X-ray 03/26/2023 FINDINGS: Hyperinflation. No consolidation, pneumothorax or effusion. No edema. Normal cardiopericardial silhouette. Overlapping cardiac leads. Fixation hardware along the lower cervical spine at the edge of the imaging field. IMPRESSION: Hyperinflation.  No acute cardiopulmonary disease. Electronically Signed   By: Karen Kays M.D.   On: 05/05/2023 17:07    ASSESSMENT & PLAN:   Assessment & Plan by Problem: Principal Problem:   Acute upper GI bleed   [name] is a 67 year old male with hypertension, hyperlipidemia, diabetes, history of peptic ulcer disease, and recent admission for upper GI bleed who presented to the ED for evaluation of recurrence of bloody stools admitted for management of presumed GI bleed.   # Acute on chronic anemia in the setting of melena Recent admission for blood loss anemia. EGD with nonbleeding gastric and duodenal  ulcers as well as nonbleeding grade D reflux esophagitis.  Presents again with melena and abdominal pain today.  CTAP with diverticulosis, gastroenteritis, but no acute inflammatory process in the abdomen or pelvis.  Hemoglobin 9.0 today, modestly improved from 8.2 at last discharge.  Of note, patient was previously prescribed Eliquis but this was only intended to be for 21 days after operative repair of femur fracture over a month ago. -Gastroenterology to see, appreciate recommendations -N.p.o. overnight -Daily CBC, transfuse for Hgb <7 -IV Protonix -Sucralfate 1 g with meals and at bedtime -Discontinue Eliquis  # Lower extremity muscle spasms Patient has a history of muscle spasms, back/leg pain in the setting of known degenerative lumbar disease and s/p L  leg fracture 1 month ago. His mobility is now very limited due to injury and he is no longer able to transfer out of a wheel chair.  Had been on oxycodone prior to last admission but had been weaned off of this with the help of his nephew.  PT/OT evaluated patient and recommended SNF during recent admission. Medical team also recommended this but the patient declined. Nephew was working with social work on Doctor, hospital.  -Dilaudid 1 mg x 1 -Continue home gabapentin 800 mg 3 times daily -Continue home Robaxin 1000 mg 3 times daily   #Malnutrition #Physical deconditioning Chronic protein calorie malnutrition and deconditioning due to immobility s/p leg fracture 1 month ago.  Patient on low-dose mirtazapine 50 mg nightly.  Seems to be a chronic problem for this gentleman and depression may be contributing. -Continue mirtazapine 50 mg nightly -Consult PT/OT   # Type 2 diabetes Patient presents with history of type 2 diabetes.  Last A1c 4.7.  Glucose 129 this admission.  He is not on medication.  His insulin resistance likely resolved with weight loss. -Does not warrant treatment at this time.     # Hypertension Patient presents with a history of hypertension.  Blood pressure soft this admission.  Will hold antihypertensives for now. -Hold home losartan -LR 1 L bolus   #Sacral pressure ulcers Patient presents with multiple pressure ulcers about his sacral area.  Patient has been bedridden since discharge on 04/10. Does not want to go to a SNF due to bad experience in the past.  -Prevalon boots -Low air loss mattress -Will follow wound care recommendations from recent admission  Diet: NPO VTE: SCDs IVF: LR,Bolus Code: Full  Prior to Admission Living Arrangement: Home, living alone Anticipated Discharge Location: Home Barriers to Discharge: Medical management  Dispo: Admit patient to Observation with expected length of stay less than 2 midnights.  Signed: Adron Bene,  MD Internal Medicine Resident PGY-1  05/06/2023, 12:05 AM

## 2023-05-05 NOTE — ED Notes (Signed)
Pt is a&ox4, warm and dry to touch. Pt is very upset and irritated because he had to wait in the hallway for a room and his pain does not help the situation. Pt began to cuss and he was advised that will not be tolerated. Pt changed into a gown, attached to monitor/vitals. Pt has a covered wound on left hip and a very strong ammonia, urine smell is present. Side rails up x 2, call light within reach.

## 2023-05-06 DIAGNOSIS — K921 Melena: Secondary | ICD-10-CM | POA: Diagnosis not present

## 2023-05-06 DIAGNOSIS — D649 Anemia, unspecified: Secondary | ICD-10-CM

## 2023-05-06 DIAGNOSIS — I159 Secondary hypertension, unspecified: Secondary | ICD-10-CM

## 2023-05-06 DIAGNOSIS — M459 Ankylosing spondylitis of unspecified sites in spine: Secondary | ICD-10-CM

## 2023-05-06 DIAGNOSIS — K922 Gastrointestinal hemorrhage, unspecified: Secondary | ICD-10-CM | POA: Diagnosis not present

## 2023-05-06 DIAGNOSIS — E46 Unspecified protein-calorie malnutrition: Secondary | ICD-10-CM | POA: Diagnosis not present

## 2023-05-06 LAB — PROTIME-INR
INR: 1.1 (ref 0.8–1.2)
Prothrombin Time: 14.3 seconds (ref 11.4–15.2)

## 2023-05-06 LAB — BASIC METABOLIC PANEL
Anion gap: 7 (ref 5–15)
BUN: 22 mg/dL (ref 8–23)
CO2: 25 mmol/L (ref 22–32)
Calcium: 8.4 mg/dL — ABNORMAL LOW (ref 8.9–10.3)
Chloride: 106 mmol/L (ref 98–111)
Creatinine, Ser: 0.73 mg/dL (ref 0.61–1.24)
GFR, Estimated: >60 mL/min (ref >60)
Glucose, Bld: 100 mg/dL — ABNORMAL HIGH (ref 70–99)
Potassium: 3.6 mmol/L (ref 3.5–5.1)
Sodium: 138 mmol/L (ref 135–145)

## 2023-05-06 LAB — GLUCOSE, CAPILLARY
Glucose-Capillary: 97 mg/dL (ref 70–99)
Glucose-Capillary: 165 mg/dL — ABNORMAL HIGH (ref 70–99)
Glucose-Capillary: 180 mg/dL — ABNORMAL HIGH (ref 70–99)

## 2023-05-06 LAB — CBC
HCT: 29.2 % — ABNORMAL LOW (ref 39.0–52.0)
Hemoglobin: 9 g/dL — ABNORMAL LOW (ref 13.0–17.0)
MCH: 31.6 pg (ref 26.0–34.0)
MCHC: 30.8 g/dL (ref 30.0–36.0)
MCV: 102.5 fL — ABNORMAL HIGH (ref 80.0–100.0)
Platelets: 321 10*3/uL (ref 150–400)
RBC: 2.85 MIL/uL — ABNORMAL LOW (ref 4.22–5.81)
RDW: 16 % — ABNORMAL HIGH (ref 11.5–15.5)
WBC: 11.3 10*3/uL — ABNORMAL HIGH (ref 4.0–10.5)
nRBC: 0 % (ref 0.0–0.2)

## 2023-05-06 LAB — APTT: aPTT: 35 seconds (ref 24–36)

## 2023-05-06 LAB — CULTURE, BLOOD (ROUTINE X 2)
Culture: NO GROWTH
Special Requests: ADEQUATE
Special Requests: ADEQUATE

## 2023-05-06 MED ORDER — LACTATED RINGERS IV BOLUS (SEPSIS)
1000.0000 mL | Freq: Once | INTRAVENOUS | Status: AC
  Administered 2023-05-06: 1000 mL via INTRAVENOUS

## 2023-05-06 MED ORDER — HYDROMORPHONE HCL 1 MG/ML IJ SOLN
0.5000 mg | Freq: Once | INTRAMUSCULAR | Status: AC
  Administered 2023-05-06: 0.5 mg via INTRAVENOUS
  Filled 2023-05-06: qty 0.5

## 2023-05-06 MED ORDER — OXYCODONE HCL 5 MG PO TABS
5.0000 mg | ORAL_TABLET | Freq: Once | ORAL | Status: AC
  Administered 2023-05-06: 5 mg via ORAL
  Filled 2023-05-06: qty 1

## 2023-05-06 MED ORDER — MEDIHONEY WOUND/BURN DRESSING EX PSTE
1.0000 | PASTE | Freq: Every day | CUTANEOUS | Status: DC
  Administered 2023-05-07 – 2023-05-08 (×2): 1 via TOPICAL
  Filled 2023-05-06 (×2): qty 44

## 2023-05-06 NOTE — ED Notes (Signed)
ED TO INPATIENT HANDOFF REPORT  ED Nurse Name and Phone #: Jodie Echevaria 86  S Name/Age/Gender [name] 67 y.o. male Room/Bed: 009C/009C  Code Status   Code Status: Full Code  Home/SNF/Other Home Patient oriented to: self, place, time, and situation Is this baseline? Yes   Triage Complete: Triage complete  Chief Complaint Acute upper GI bleed [K92.2]  Triage Note Pt is coming from home via EMS, a&ox4, warm and dry to touch. Pt is complaining of abdominal pain, leg pain and blood in stool. Pt denies cp/shob.   Allergies Allergies  Allergen Reactions   Lipitor [Atorvastatin Calcium] Other (See Comments)    Myalgias     Level of Care/Admitting Diagnosis ED Disposition     ED Disposition  Admit   Condition  --   Comment  Hospital Area: [facility] [[id number]]  Level of Care: Med-Surg [16]  May place patient in observation at [facility] or [facility] if equivalent level of care is available:: No  Covid Evaluation: Asymptomatic - no recent exposure (last 10 days) testing not required  Diagnosis: Acute upper GI bleed [161096]  Admitting Physician: Ginnie Smart [[id number]]  Attending Physician: Ninetta Lights, JEFFREY C [[id number]]          B Medical/Surgery History Past Medical History:  Diagnosis Date   Cellulitis 07/17/2020   LEFT HIP   Diabetes mellitus    Dyspnea    Hyperlipidemia    Hypertension    Past Surgical History:  Procedure Laterality Date   BIOPSY  05/01/2023   Procedure: BIOPSY;  Surgeon: Beverley Fiedler, MD;  Location: [facility] ENDOSCOPY;  Service: Gastroenterology;;   ESOPHAGOGASTRODUODENOSCOPY (EGD) WITH PROPOFOL N/A 05/01/2023   Procedure: ESOPHAGOGASTRODUODENOSCOPY (EGD) WITH PROPOFOL;  Surgeon: Beverley Fiedler, MD;  Location: [facility] ENDOSCOPY;  Service: Gastroenterology;  Laterality: N/A;   FEMUR IM NAIL Left 03/27/2023   Procedure: INTRAMEDULLARY (IM) RETROGRADE FEMORAL NAILING;  Surgeon: Myrene Galas, MD;  Location: [facility] OR;  Service:  Orthopedics;  Laterality: Left;   SPINE SURGERY       A IV Location/Drains/Wounds Patient Lines/Drains/Airways Status     Active Line/Drains/Airways     Name Placement date Placement time Site Days   Peripheral IV 05/05/23 20 G Left Antecubital 05/05/23  1710  Antecubital  1   Pressure Injury 04/30/23 Hip Anterior;Left;Proximal Stage 2 -  Partial thickness loss of dermis presenting as a shallow open injury with a red, pink wound bed without slough. close to R hip 04/30/23  2200  -- 6   Pressure Injury 04/30/23 Hip Anterior;Proximal;Right Stage 2 -  Partial thickness loss of dermis presenting as a shallow open injury with a red, pink wound bed without slough. 1/2 cm by 0.8cm by 0.2 cm 04/30/23  2200  -- 6   Pressure Injury 04/30/23 Heel Right;Left Stage 1 -  Intact skin with non-blanchable redness of a localized area usually over a bony prominence. soft pressure on heels bilaterally foams applied 04/30/23  2200  -- 6   Wound / Incision (Open or Dehisced) 08/02/20 Sacrum Medial 08/02/20  1458  Sacrum  1007   Wound / Incision (Open or Dehisced) 08/03/20 Hip Left 08/03/20  1513  Hip  1006   Wound / Incision (Open or Dehisced) 09/07/20 (MASD) Moisture Associated Skin Damage Pelvis Anterior 09/07/20  0800  Pelvis  971            Intake/Output Last 24 hours No intake or output data in the 24 hours ending 05/06/23 0034  Labs/Imaging Results for orders placed or performed during the hospital encounter of 05/05/23 (from the past 48 hour(s))  Lactic acid, plasma     Status: None   Collection Time: 05/05/23  4:42 PM  Result Value Ref Range   Lactic Acid, Venous 1.3 0.5 - 1.9 mmol/L    Comment: Performed at [facility], [address], [place], [place] [address]  Comprehensive metabolic panel     Status: Abnormal   Collection Time: 05/05/23  5:20 PM  Result Value Ref Range   Sodium 138 135 - 145 mmol/L   Potassium 4.2 3.5 - 5.1 mmol/L    Comment: HEMOLYSIS AT THIS LEVEL MAY AFFECT  RESULT   Chloride 105 98 - 111 mmol/L   CO2 24 22 - 32 mmol/L   Glucose, Bld 188 (H) 70 - 99 mg/dL    Comment: Glucose reference range applies only to samples taken after fasting for at least 8 hours.   BUN 28 (H) 8 - 23 mg/dL   Creatinine, Ser 6.04 0.61 - 1.24 mg/dL   Calcium 8.8 (L) 8.9 - 10.3 mg/dL   Total Protein 6.7 6.5 - 8.1 g/dL   Albumin 2.8 (L) 3.5 - 5.0 g/dL   AST 29 15 - 41 U/L    Comment: HEMOLYSIS AT THIS LEVEL MAY AFFECT RESULT   ALT 18 0 - 44 U/L    Comment: HEMOLYSIS AT THIS LEVEL MAY AFFECT RESULT   Alkaline Phosphatase 110 38 - 126 U/L   Total Bilirubin 0.8 0.3 - 1.2 mg/dL    Comment: HEMOLYSIS AT THIS LEVEL MAY AFFECT RESULT   GFR, Estimated >60 >60 mL/min    Comment: (NOTE) Calculated using the CKD-EPI Creatinine Equation (2021)    Anion gap 9 5 - 15    Comment: Performed at [facility], [address] [place], [place] [address]  CBC with Differential     Status: Abnormal   Collection Time: 05/05/23  5:20 PM  Result Value Ref Range   WBC 12.0 (H) 4.0 - 10.5 K/uL   RBC 2.92 (L) 4.22 - 5.81 MIL/uL   Hemoglobin 9.0 (L) 13.0 - 17.0 g/dL   HCT 11.9 (L) 14.7 - 82.9 %   MCV 100.3 (H) 80.0 - 100.0 fL   MCH 30.8 26.0 - 34.0 pg   MCHC 30.7 30.0 - 36.0 g/dL   RDW 56.2 (H) 13.0 - 86.5 %   Platelets 387 150 - 400 K/uL   nRBC 0.0 0.0 - 0.2 %   Neutrophils Relative % 75 %   Neutro Abs 9.1 (H) 1.7 - 7.7 K/uL   Lymphocytes Relative 15 %   Lymphs Abs 1.8 0.7 - 4.0 K/uL   Monocytes Relative 5 %   Monocytes Absolute 0.6 0.1 - 1.0 K/uL   Eosinophils Relative 3 %   Eosinophils Absolute 0.3 0.0 - 0.5 K/uL   Basophils Relative 1 %   Basophils Absolute 0.1 0.0 - 0.1 K/uL   Immature Granulocytes 1 %   Abs Immature Granulocytes 0.07 0.00 - 0.07 K/uL    Comment: Performed at [facility], [address] [place], [place] [address]  Protime-INR     Status: None   Collection Time: 05/05/23  5:20 PM  Result Value Ref Range   Prothrombin Time 14.1 11.4 - 15.2  seconds   INR 1.1 0.8 - 1.2    Comment: (NOTE) INR goal varies based on device and disease states. Performed at [facility], [address], [place], [place] [address]   Lipase, blood     Status: None   Collection Time: 05/05/23  5:20 PM  Result Value Ref Range   Lipase 27 11 - 51 U/L    Comment: Performed at [facility], [address], [place], [place] [address]  Troponin I (High Sensitivity)     Status: None   Collection Time: 05/05/23  5:20 PM  Result Value Ref Range   Troponin I (High Sensitivity) 8 <18 ng/L    Comment: (NOTE) Elevated high sensitivity troponin I (hsTnI) values and significant  changes across serial measurements may suggest ACS but many other  chronic and acute conditions are known to elevate hsTnI results.  Refer to the "Links" section for chest pain algorithms and additional  guidance. Performed at [facility], [address], [place], [place] [address]   Magnesium     Status: None   Collection Time: 05/05/23  5:20 PM  Result Value Ref Range   Magnesium 2.1 1.7 - 2.4 mg/dL    Comment: Performed at [facility], [address], [place], [place] [address]  Type and screen [facility]     Status: None   Collection Time: 05/05/23  5:28 PM  Result Value Ref Range   ABO/RH(D) B POS    Antibody Screen NEG    Sample Expiration      05/08/2023,2359 Performed at [facility], [address], [place], [place] [address]   I-Stat Chem 8, ED     Status: Abnormal   Collection Time: 05/05/23  5:51 PM  Result Value Ref Range   Sodium 141 135 - 145 mmol/L   Potassium 4.4 3.5 - 5.1 mmol/L   Chloride 106 98 - 111 mmol/L   BUN 39 (H) 8 - 23 mg/dL   Creatinine, Ser 6.96 0.61 - 1.24 mg/dL   Glucose, Bld 295 (H) 70 - 99 mg/dL    Comment: Glucose reference range applies only to samples taken after fasting for at least 8 hours.   Calcium, Ion 1.17 1.15 - 1.40 mmol/L   TCO2 28 22 - 32 mmol/L   Hemoglobin 10.2 (L) 13.0 -  17.0 g/dL   HCT 28.4 (L) 13.2 - 44.0 %  I-Stat venous blood gas, ED ([facility])     Status: Abnormal   Collection Time: 05/05/23  5:51 PM  Result Value Ref Range   pH, Ven 7.351 7.25 - 7.43   pCO2, Ven 51.0 44 - 60 mmHg   pO2, Ven 37 32 - 45 mmHg   Bicarbonate 28.3 (H) 20.0 - 28.0 mmol/L   TCO2 30 22 - 32 mmol/L   O2 Saturation 66 %   Acid-Base Excess 2.0 0.0 - 2.0 mmol/L   Sodium 140 135 - 145 mmol/L   Potassium 4.4 3.5 - 5.1 mmol/L   Calcium, Ion 1.18 1.15 - 1.40 mmol/L   HCT 30.0 (L) 39.0 - 52.0 %   Hemoglobin 10.2 (L) 13.0 - 17.0 g/dL   Sample type VENOUS    Comment NOTIFIED PHYSICIAN   Lactic acid, plasma     Status: None   Collection Time: 05/05/23  7:06 PM  Result Value Ref Range   Lactic Acid, Venous 1.2 0.5 - 1.9 mmol/L    Comment: Performed at [facility], [address], [place], [place] [address]  Troponin I (High Sensitivity)     Status: None   Collection Time: 05/05/23  7:06 PM  Result  Value Ref Range   Troponin I (High Sensitivity) 9 <18 ng/L    Comment: (NOTE) Elevated high sensitivity troponin I (hsTnI) values and significant  changes across serial measurements may suggest ACS but many other  chronic and acute conditions are known to elevate hsTnI results.  Refer to the "Links" section for chest pain algorithms and additional  guidance. Performed at [facility], [address], [place], [place] [address]   POC occult blood, ED     Status: Abnormal   Collection Time: 05/05/23  8:01 PM  Result Value Ref Range   Fecal Occult Bld POSITIVE (A) NEGATIVE  CBG monitoring, ED     Status: None   Collection Time: 05/05/23 10:03 PM  Result Value Ref Range   Glucose-Capillary 84 70 - 99 mg/dL    Comment: Glucose reference range applies only to samples taken after fasting for at least 8 hours.   CT ABDOMEN PELVIS W CONTRAST  Result Date: 05/05/2023 CLINICAL DATA:  Generalized abdominal pain. EXAM: CT ABDOMEN AND PELVIS WITH CONTRAST TECHNIQUE:  Multidetector CT imaging of the abdomen and pelvis was performed using the standard protocol following bolus administration of intravenous contrast. RADIATION DOSE REDUCTION: This exam was performed according to the departmental dose-optimization program which includes automated exposure control, adjustment of the mA and/or kV according to patient size and/or use of iterative reconstruction technique. CONTRAST:  75mL OMNIPAQUE IOHEXOL 350 MG/ML SOLN COMPARISON:  CT chest, abdomen and pelvis with IV contrast 09/11/2020. FINDINGS: Technical note: Pelvic imaging is limited due to extensive beam hardening and streak artifacts related patient's overlying arms, and due to left pelvic and left femoral shaft fracture fixation hardware. In addition, the study was performed with patient positioned right posterior oblique. Lower chest: Lung bases again show emphysematous changes and multifocal cystic changes in the right-greater-than-left lower lobes, base of the right middle lobe. Mild chronic elevation right hemidiaphragm. Asymmetric right lower lobe atelectasis related to positioning. The cardiac size is normal. There is a small chronic pericardial effusion anteriorly and chronic three-vessel coronary artery calcific plaques. Hepatobiliary: There are stable scattered subcentimeter hepatic cysts. There is no mass enhancement of the liver. The gallbladder and bile ducts are unremarkable. Pancreas: Partially atrophic.  Otherwise unremarkable. Spleen: No abnormality. Adrenals/Urinary Tract: There is mild adrenal hyperplasia. There is no adrenal mass. There is a stable 1.8 cm homogeneous thin walled cyst in the superior pole right kidney. There are additional few scattered stable less than 5 mm hypodensities in the bilateral renal cortex which are too small to characterize but most likely cysts. No follow-up imaging is recommended. For reference see JACR 2018 Feb; 264-273, Management of the Incidental Renal Mass on CT,  [employer] 2021; 814-848, Bosniak Classification of Cystic Renal Masses, Version 2019. There is no renal mass enhancement and no urinary stone or obstruction. The bladder is not well seen due to pelvic spray and streak artifacts but it is normal in thickness where visible. Stomach/Bowel: There are moderate diffuse thickened folds in the stomach. The small bowel is normal caliber. A 3.2 cm diverticulum is again noted of the third portion of the duodenum just below the neck and body junction of the pancreas. The appendix was previously present and normal. I do not see it on today's scan probably due to extensive pelvic imaging artifacts obscuring it. There is no obvious right lower quadrant inflammatory process. There is moderate to severe fecal stasis. There colonic diverticulosis greatest in the sigmoid without obvious findings of acute diverticulitis although  a proximal sigmoid diverticulitis would be obscured due to metal artifacts. Vascular/Lymphatic: There are heavy aortoiliac calcific plaques. IVC filter chronically noted. No AAA is seen. There is less significant visceral branch vessel atherosclerosis. No lymphadenopathy is seen. Reproductive: Enlarged prostate 4.9 cm transverse axis with dystrophic calcifications posteriorly. Size similar to 2021. Other: There is no free air, free hemorrhage or free fluid. Multiple pelvic phleboliths. There are no incarcerated hernias. Musculoskeletal: Left pelvic fracture fixation plating is again noted with new demonstration of a partially visible left femoral intramedullary fracture fixation rod. There are bridging syndesmophytes and bridging enthesopathy of the thoracolumbar spine throughout. There is osteopenia with a spectrum of degenerative changes of the left-greater-than-right hips. No acute or other significant osseous findings There are bilaterally ankylosed SI joints and lumbar facet hypertrophy. IMPRESSION: 1. No acute inflammatory process is seen in the  abdomen or pelvis. Please note however, assessment for sigmoid or other pelvic inflammatory changes is very limited on this study. 2. Diverticulosis with moderate to severe fecal stasis. No bowel obstruction. 3. Gastroenteritis. 4. Prostatomegaly. 5. Aortic and coronary artery atherosclerosis. 6. Left pelvic chronic plating with new demonstration of a partially visible left femoral intramedullary rod. 7. Bridging syndesmophytes and bridging enthesopathy of the thoracolumbar spine, with ankylosed SI joints. Findings could be due to ankylosing spondylitis or other seronegative spondyloarthropathy. 8. Emphysema and cystic changes in the lung bases. Aortic Atherosclerosis (ICD10-I70.0) and Emphysema (ICD10-J43.9). Electronically Signed   By: Almira Bar M.D.   On: 05/05/2023 22:17   DG Chest Port 1 View  Result Date: 05/05/2023 CLINICAL DATA:  Sepsis EXAM: PORTABLE CHEST 1 VIEW COMPARISON:  X-ray 03/26/2023 FINDINGS: Hyperinflation. No consolidation, pneumothorax or effusion. No edema. Normal cardiopericardial silhouette. Overlapping cardiac leads. Fixation hardware along the lower cervical spine at the edge of the imaging field. IMPRESSION: Hyperinflation.  No acute cardiopulmonary disease. Electronically Signed   By: Karen Kays M.D.   On: 05/05/2023 17:07    Pending Labs Unresulted Labs (From admission, onward)     Start     Ordered   05/06/23 0500  Basic metabolic panel  Tomorrow morning,   R        05/05/23 2317   05/06/23 0500  CBC  Tomorrow morning,   R        05/05/23 2317   05/06/23 0500  Protime-INR  Tomorrow morning,   R        05/05/23 2317   05/06/23 0500  APTT  Tomorrow morning,   R        05/05/23 2317   05/05/23 1642  Blood Culture (routine x 2)  (Undifferentiated presentation (screening labs and basic nursing orders))  BLOOD CULTURE X 2,   STAT      05/05/23 1642            Vitals/Pain Today's Vitals   05/05/23 2000 05/05/23 2100 05/05/23 2156 05/06/23 0000  BP: 92/60  108/63  125/66  Pulse: 79 80  77  Resp: 14 14  14   Temp:    98.6 F (37 C)  TempSrc:    Oral  SpO2: 100% 100%  98%  Weight:      Height:      PainSc:   0-No pain 10-Worst pain ever    Isolation Precautions No active isolations  Medications Medications  pantoprazole (PROTONIX) injection 40 mg (40 mg Intravenous Given 05/05/23 2156)  acetaminophen (TYLENOL) tablet 650 mg (has no administration in time range)    Or  acetaminophen (TYLENOL) suppository  650 mg (has no administration in time range)  gabapentin (NEURONTIN) capsule 800 mg (800 mg Oral Given 05/05/23 2355)  sucralfate (CARAFATE) 1 GM/10ML suspension 1 g (has no administration in time range)  mirtazapine (REMERON SOL-TAB) disintegrating tablet 15 mg (15 mg Oral Given 05/06/23 0002)  methocarbamol (ROBAXIN) tablet 1,000 mg (1,000 mg Oral Given 05/06/23 0002)  lactated ringers bolus 1,000 mL (has no administration in time range)  lactated ringers bolus 500 mL (0 mLs Intravenous Stopped 05/05/23 1844)  HYDROmorphone (DILAUDID) injection 0.5 mg (0.5 mg Intravenous Given 05/05/23 1747)  HYDROmorphone (DILAUDID) injection 0.5 mg (0.5 mg Intravenous Given 05/05/23 1858)  iohexol (OMNIPAQUE) 350 MG/ML injection 75 mL (75 mLs Intravenous Contrast Given 05/05/23 2139)  HYDROmorphone (DILAUDID) injection 1 mg (1 mg Intravenous Given 05/06/23 0003)    Mobility non-ambulatory     Focused Assessments Cardiac Assessment Handoff:  Cardiac Rhythm: Sinus tachycardia Lab Results  Component Value Date   CKTOTAL 2,213 (H) 03/27/2023   No results found for: "DDIMER" Does the Patient currently have chest pain? No   , Neuro Assessment Handoff:  Swallow screen pass? Yes  Cardiac Rhythm: Sinus tachycardia       Neuro Assessment:   Neuro Checks:      Has TPA been given? No If patient is a Neuro Trauma and patient is going to OR before floor call report to 4N Charge nurse: [phone] or [phone]  , Pulmonary Assessment  Handoff:  Lung sounds:   O2 Device: Room Air      R Recommendations: See Admitting Provider Note  Report given to:   Additional Notes: n/a

## 2023-05-06 NOTE — NC FL2 (Signed)
[place] MEDICAID FL2 LEVEL OF CARE FORM     IDENTIFICATION  Patient Name: [name] Birthdate: 1956-04-05 Sex: male Admission Date (Current Location): 05/05/2023  [facility] and [place] Number:  Producer, television/film/video and Address:  The . [facility], [address], [place], [place] [address]      Provider Number: [id number]  Attending Physician Name and Address:  Ginnie Smart, MD  Relative Name and Phone Number:  N/A    Current Level of Care: Hospital Recommended Level of Care: Skilled Nursing Facility Prior Approval Number:    Date Approved/Denied:   PASRR Number: [id number]  Discharge Plan: SNF    Current Diagnoses: Patient Active Problem List   Diagnosis Date Noted   Secondary hypertension 05/06/2023   Acute upper GI bleed 05/05/2023   Paraplegia (HCC) 05/04/2023   Protein-calorie malnutrition, severe 05/02/2023   Decubitus ulcer of sacral region, unstageable (HCC) 05/01/2023   Acute esophagitis 05/01/2023   Gastric ulcer 05/01/2023   Duodenal ulcer 05/01/2023   Acute GI bleeding 04/30/2023   Acute blood loss anemia 04/30/2023   Melena 04/30/2023   History of gastric ulcer 04/30/2023   AKI (acute kidney injury) (HCC) 03/27/2023   Traumatic rhabdomyolysis (HCC) 03/27/2023   Closed femur fracture (HCC) 03/26/2023   Malnutrition of moderate degree 09/11/2020   Loss of weight 09/07/2020   Candidal intertrigo 09/07/2020   Fall at home 09/06/2020   Hypoglycemia 07/31/2020   Purulent Cellulitis of left hip 07/15/2020   Recurrent falls 07/15/2020   Frailty syndrome in geriatric patient 07/15/2020   Physical deconditioning 07/15/2020   Asymptomatic bacteriuria 07/15/2020   Diabetes mellitus (HCC) 01/28/2012    Orientation RESPIRATION BLADDER Height & Weight     Self, Time, Situation, Place    Incontinent Weight: 139 lb 15.9 oz (63.5 kg) Height:  5\' 10"  (177.8 cm)  BEHAVIORAL SYMPTOMS/MOOD NEUROLOGICAL BOWEL NUTRITION STATUS       Continent    AMBULATORY STATUS COMMUNICATION OF NEEDS Skin   Total Care Verbally PU Stage and Appropriate Care, Skin abrasions (Pressure Injuries bilateral hips, st 2. R Heel st 1. MASD of Pelvis. Wounds on Sacrum and L hip.)                       Personal Care Assistance Level of Assistance  Bathing, Feeding, Dressing Bathing Assistance: Maximum assistance Feeding assistance: Limited assistance Dressing Assistance: Maximum assistance     Functional Limitations Info  Sight, Hearing, Speech Sight Info: Adequate Hearing Info: Adequate Speech Info: Impaired    SPECIAL CARE FACTORS FREQUENCY  PT (By licensed PT), OT (By licensed OT)     PT Frequency: 5x week OT Frequency: 5x week            Contractures Contractures Info: Not present    Additional Factors Info  Code Status, Allergies Code Status Info: Full Allergies Info: Lipitor (Atorvastatin)           Current Medications (05/06/2023):  This is the current hospital active medication list Current Facility-Administered Medications  Medication Dose Route Frequency Provider Last Rate Last Admin   acetaminophen (TYLENOL) tablet 650 mg  650 mg Oral Q6H PRN Merrilyn Puma, MD   650 mg at 05/06/23 5621   Or   acetaminophen (TYLENOL) suppository 650 mg  650 mg Rectal Q6H PRN Merrilyn Puma, MD       gabapentin (NEURONTIN) capsule 800 mg  800 mg Oral TID Merrilyn Puma, MD   800 mg at  05/06/23 0851   leptospermum manuka honey (MEDIHONEY) paste 1 Application  1 Application Topical Daily Ginnie Smart, MD       methocarbamol (ROBAXIN) tablet 1,000 mg  1,000 mg Oral TID Merrilyn Puma, MD   1,000 mg at 05/06/23 0851   mirtazapine (REMERON SOL-TAB) disintegrating tablet 15 mg  15 mg Oral QHS Merrilyn Puma, MD   15 mg at 05/06/23 0002   pantoprazole (PROTONIX) injection 40 mg  40 mg Intravenous Q12H Gloris Manchester, MD   40 mg at 05/06/23 0852   sucralfate (CARAFATE) 1 GM/10ML suspension 1 g  1 g Oral TID WC & HS Merrilyn Puma, MD   1 g at 05/06/23 1223     Discharge Medications: Please see discharge summary for a list of discharge medications.  Relevant Imaging Results:  Relevant Lab Results:   Additional Information SS# [id number]  Carley Hammed, LCSW

## 2023-05-06 NOTE — Hospital Course (Addendum)
#  GI bleed, melena  Acute on chronic anemia  [name] is a 67 year old male with history of peptic ulcer disease, recent hospitalization for GI bleed (5/13) who presents to ED on 05/14 with recurrent bloody bowel movements concerning for ongoing upper GI bleed. Patient was found sitting in urine and dark, bloody stool by home health nurse prior to hospitalization. Of note, patient had a few normal bowel movements prior to having dark stools again. No bright red blood noted. He did have some mild upper abdominal pain but no pain with eating. Has an appetite. No dizziness, lightheadedness. Hgb was 9.0 on admission up from 8.2 on recent discharge. GI was consulted on admission and did not perform repeat EGD (last performed during recent hospitalization, demonstrating non-bleeding gastric/duodenal ulcers). Given stable vitals, labs, and minor discoloration of stool, patient at low-risk for ongoing large-volume bleeding from gastric/duodenal ulcers. Most likely etiology of patient's dark stools is old blood from prior bleeds. Patient may have also restarted home eliquis, which should have been discontinued (for L femur nailing procedure), and this may have contributed to possible small-volume bleeds. Prior to discharge, patient was tolerating a soft diet and had normal, non-bloody bowel movements. Patient advised to continue daily PPI therapy (pantoprazole 40 mg BID) for 12 weeks, liquid sucralfate for 1-2 weeks, and follow-up surveillance EGD in 12 weeks.    #Muscle spasm  Leg, back pain Patient has a history of muscle spasms, back/leg pain in the setting of know degenerative lumbar disease and s/p L leg fracture 1 month ago. CTAP on admission concerning for ankylosing spondylitis, which may also be contributing to patient's pain. His mobility is limited due to injury and he is no longer able to transfer out of a wheel chair. Evaluated by PT who advised ongoing therapy to improve mobility of lower extremities.  Muscle spasms, pain well-controlled with home gabapentin (800 mg TID) and home robaxin (1 g TID) and oxycodone (5 mg q6h PRN) during admission. Will be discharged to SNF given limited support at home and immobility.    #Sacral pressure ulcers Patient presents with multiple sacral pressure ulcers. Of note, patient has been bedridden since 04/10 and wounds worsen while sitting in urine, stool. At home, his support is very limited. During hospitalization, patient's ulcers were redressed and wounds appear stable, non-infected. He is amenable to going to SNF upon discharge. Of note, he was evaluated by wound care team during prior admission -- recommendations for ongoing care were provided in their note.     #Malnutrition Patient has a history of malnutrition, poor PO intake. BMI 22.52 during hospitalization, which is an improvement. Patient on low-dose mirtazapine 50 mg nightly with good appetite prior to discharge. Advised to continue mirtazapine 50 mg nightly.   #Type 2 diabetes Patient presents with history of type 2 diabetes. Last A1c 4.7. Glucose 129 on admission. He is not on medication. His insulin resistance may have improved with weight loss.  # Hypertension Patient presents with a history of hypertension. Blood pressure was within normal limits during this admission while holding home medications. Can continue home regimen on discharge.   General: Chronically ill-appearing male sitting in hospital bed, in no acute distress   Lungs:  Normal respiratory effort on RA. Clear to auscultation BL without crackles or wheezes.  Heart: RRR. S1 and S2 normal without gallop, murmur, or rubs.  Abdomen:  Soft, non-distended. Normal bowel sounds.
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
## 2023-05-06 NOTE — Consult Note (Addendum)
Referring Provider: ? Primary Care Physician:  Pcp, No Primary Gastroenterologist:  [facility]  Reason for Consultation:  Melena  HPI: [name] is a 67 y.o. male with past medical history significant for hypertension, diabetes, hyperlipidemia, chronic pain syndrome, anemia, peptic ulcer disease, elevated LFTs, constipation, s/p IM nail on 03/26/2022.  Was admitted 5/9-5/13 for UGI bleed.    EGD on 5/10 that showed grade D esophagitis, large 2 cm non-bleeding gastric ulcer, and 1 cm non-bleeding duodenal ulcer.   A. STOMACH, ULCER, BIOPSY:  -  Predominantly antral type mucosa with chemical/reactive/reparative  change.  - No Helicobacter pylori organisms identified on HE stained slide.   B. STOMACH, BODY, BIOPSY:  -  Oxyntic mucosa with mild superficial reactive/reparative change.  -  No Helicobacter pylori organisms identified on HE stained slide.   Plan was for BID PPI for 12 weeks and repeat EGD in 8-12 weeks.  Was discharged 5/13.  Had a dark/black BM the morning of 5/14 when home health came to his house.  Was brought in by EMS.  Hgb 9.0 grams which is fairly stable.  No further BM so far here.  He wants to eat.  No significant abdominal pain.  Reports pain in his back and leg.  There is some confusion about his Eliquis.  He was placed on that after his IM nail last month and was supposed to continue it for 21 days.  It is still listed in his meds to be continued on his discharge summary from 5/13, but per primary service he is not supposed to be taking it and there are no plans to resume it from here.   Past Medical History:  Diagnosis Date   Cellulitis 07/17/2020   LEFT HIP   Diabetes mellitus    Dyspnea    Hyperlipidemia    Hypertension     Past Surgical History:  Procedure Laterality Date   BIOPSY  05/01/2023   Procedure: BIOPSY;  Surgeon: Beverley Fiedler, MD;  Location: [facility] ENDOSCOPY;  Service: Gastroenterology;;   ESOPHAGOGASTRODUODENOSCOPY (EGD)  WITH PROPOFOL N/A 05/01/2023   Procedure: ESOPHAGOGASTRODUODENOSCOPY (EGD) WITH PROPOFOL;  Surgeon: Beverley Fiedler, MD;  Location: [facility] ENDOSCOPY;  Service: Gastroenterology;  Laterality: N/A;   FEMUR IM NAIL Left 03/27/2023   Procedure: INTRAMEDULLARY (IM) RETROGRADE FEMORAL NAILING;  Surgeon: Myrene Galas, MD;  Location: [facility] OR;  Service: Orthopedics;  Laterality: Left;   SPINE SURGERY      Prior to Admission medications   Medication Sig Start Date End Date Taking? Authorizing Provider  acetaminophen (TYLENOL) 325 MG tablet Take 650 mg by mouth every 6 (six) hours as needed for moderate pain, fever or headache.   Yes [provider]  docusate sodium (COLACE) 100 MG capsule Take 100 mg by mouth at bedtime.   Yes [provider]  gabapentin (NEURONTIN) 800 MG tablet Take 800 mg by mouth 3 (three) times daily.   Yes [provider]  losartan (COZAAR) 25 MG tablet Take 25 mg by mouth daily.   Yes [provider]  methocarbamol (ROBAXIN) 500 MG tablet Take 2 tablets (1,000 mg total) by mouth 3 (three) times daily. 05/04/23 06/03/23 Yes Willette Cluster, MD  mirtazapine (REMERON SOL-TAB) 15 MG disintegrating tablet Take 1 tablet (15 mg total) by mouth at bedtime. 04/01/23 03/31/24 Yes Morene Crocker, MD  pantoprazole (PROTONIX) 40 MG tablet Take 1 tablet (40 mg total) by mouth 2 (two) times daily before a meal. 05/04/23 06/03/23 Yes Willette Cluster, MD  polyethylene glycol powder (GLYCOLAX/MIRALAX) 17 GM/SCOOP powder Take 17 g by mouth daily.   Yes [provider]  sucralfate (CARAFATE) 1 GM/10ML suspension Take 10 mLs (1 g total) by mouth 4 (four) times daily -  with meals and at bedtime. 05/04/23  Yes Willette Cluster, MD    Current Facility-Administered Medications  Medication Dose Route Frequency Provider Last Rate Last Admin   acetaminophen (TYLENOL) tablet 650 mg  650 mg Oral Q6H PRN Merrilyn Puma, MD   650 mg at 05/06/23 1610   Or   acetaminophen (TYLENOL)  suppository 650 mg  650 mg Rectal Q6H PRN Merrilyn Puma, MD       gabapentin (NEURONTIN) capsule 800 mg  800 mg Oral TID Merrilyn Puma, MD   800 mg at 05/06/23 9604   HYDROmorphone (DILAUDID) injection 0.5 mg  0.5 mg Intravenous Once Willette Cluster, MD       leptospermum manuka honey (MEDIHONEY) paste 1 Application  1 Application Topical Daily Ginnie Smart, MD       methocarbamol (ROBAXIN) tablet 1,000 mg  1,000 mg Oral TID Merrilyn Puma, MD   1,000 mg at 05/06/23 0851   mirtazapine (REMERON SOL-TAB) disintegrating tablet 15 mg  15 mg Oral QHS Merrilyn Puma, MD   15 mg at 05/06/23 0002   pantoprazole (PROTONIX) injection 40 mg  40 mg Intravenous Q12H Gloris Manchester, MD   40 mg at 05/06/23 0852   sucralfate (CARAFATE) 1 GM/10ML suspension 1 g  1 g Oral TID WC & HS Merrilyn Puma, MD   1 g at 05/06/23 0851    Allergies as of 05/05/2023 - Review Complete 05/05/2023  Allergen Reaction Noted   Lipitor [atorvastatin calcium] Other (See Comments) 01/28/2012    No family history on file.  Social History   Socioeconomic History   Marital status: Single    Spouse name: Not on file   Number of children: Not on file   Years of education: Not on file   Highest education level: Not on file  Occupational History   Not on file  Tobacco Use   Smoking status: Every Day    Packs/day: 0.50    Years: 40.00    Additional pack years: 0.00    Total pack years: 20.00    Types: Cigarettes   Smokeless tobacco: Never  Vaping Use   Vaping Use: Never used  Substance and Sexual Activity   Alcohol use: No   Drug use: No   Sexual activity: Not Currently  Other Topics Concern   Not on file  Social History Narrative   Not on file   Social Determinants of Health   Financial Resource Strain: Not on file  Food Insecurity: No Food Insecurity (05/01/2023)   Hunger Vital Sign    Worried About Running Out of Food in the Last Year: Never true    Ran Out of Food in the Last Year: Never true   Transportation Needs: No Transportation Needs (05/01/2023)   PRAPARE - Administrator, Civil Service (Medical): No    Lack of Transportation (Non-Medical): No  Physical Activity: Not on file  Stress: Not on file  Social Connections: Not on file  Intimate Partner Violence: Not At Risk (05/01/2023)   Humiliation, Afraid, Rape, and Kick questionnaire    Fear of Current or Ex-Partner: No    Emotionally Abused: No    Physically Abused: No    Sexually Abused: No    Review of Systems: ROS is O/W negative except  as mentioned in HPI.  Physical Exam: Vital signs in last 24 hours: Temp:  [97.9 F (36.6 C)-98.6 F (37 C)] 98 F (36.7 C) (05/15 0746) Pulse Rate:  [72-93] 79 (05/15 0746) Resp:  [14-19] 16 (05/15 0746) BP: (81-125)/(58-68) 118/63 (05/15 0746) SpO2:  [92 %-100 %] 100 % (05/15 0746) Weight:  [63.5 kg] 63.5 kg (05/14 1625) Last BM Date : 05/05/23 General:  Alert, frail, in NAD Head:  Normocephalic and atraumatic. Eyes:  Sclera clear, no icterus.  Conjunctiva pink. Ears:  Normal auditory acuity. Mouth:  No deformity or lesions.   Lungs:  Clear throughout to auscultation.  No wheezes, crackles, or rhonchi.  Heart:  Regular rate and rhythm; no murmurs, clicks, rubs, or gallops. Abdomen:  Soft, non-distended.  BS present.  Non-tender.    Msk:  Symmetrical without gross deformities. Pulses:  Normal pulses noted. Extremities:  Without clubbing or edema. Neurologic:  Alert and oriented x 4;  grossly normal neurologically. Skin:  Intact without significant lesions or rashes. Psych:  Alert and cooperative. Normal mood and affect.  Intake/Output from previous day: 05/14 0701 - 05/15 0700 In: 1000 [IV Piggyback:1000] Out: -   Lab Results: Recent Labs    05/04/23 0154 05/05/23 1720 05/05/23 1751 05/06/23 0614  WBC 11.2* 12.0*  --  11.3*  HGB 8.2* 9.0* 10.2*  10.2* 9.0*  HCT 25.9* 29.3* 30.0*  30.0* 29.2*  PLT 272 387  --  321   BMET Recent Labs     05/05/23 1720 05/05/23 1751  NA 138 140  141  K 4.2 4.4  4.4  CL 105 106  CO2 24  --   GLUCOSE 188* 189*  BUN 28* 39*  CREATININE 0.92 1.00  CALCIUM 8.8*  --    LFT Recent Labs    05/05/23 1720  PROT 6.7  ALBUMIN 2.8*  AST 29  ALT 18  ALKPHOS 110  BILITOT 0.8   PT/INR Recent Labs    05/05/23 1720 05/06/23 0614  LABPROT 14.1 14.3  INR 1.1 1.1   Studies/Results: CT ABDOMEN PELVIS W CONTRAST  Result Date: 05/05/2023 CLINICAL DATA:  Generalized abdominal pain. EXAM: CT ABDOMEN AND PELVIS WITH CONTRAST TECHNIQUE: Multidetector CT imaging of the abdomen and pelvis was performed using the standard protocol following bolus administration of intravenous contrast. RADIATION DOSE REDUCTION: This exam was performed according to the departmental dose-optimization program which includes automated exposure control, adjustment of the mA and/or kV according to patient size and/or use of iterative reconstruction technique. CONTRAST:  75mL OMNIPAQUE IOHEXOL 350 MG/ML SOLN COMPARISON:  CT chest, abdomen and pelvis with IV contrast 09/11/2020. FINDINGS: Technical note: Pelvic imaging is limited due to extensive beam hardening and streak artifacts related patient's overlying arms, and due to left pelvic and left femoral shaft fracture fixation hardware. In addition, the study was performed with patient positioned right posterior oblique. Lower chest: Lung bases again show emphysematous changes and multifocal cystic changes in the right-greater-than-left lower lobes, base of the right middle lobe. Mild chronic elevation right hemidiaphragm. Asymmetric right lower lobe atelectasis related to positioning. The cardiac size is normal. There is a small chronic pericardial effusion anteriorly and chronic three-vessel coronary artery calcific plaques. Hepatobiliary: There are stable scattered subcentimeter hepatic cysts. There is no mass enhancement of the liver. The gallbladder and bile ducts are  unremarkable. Pancreas: Partially atrophic.  Otherwise unremarkable. Spleen: No abnormality. Adrenals/Urinary Tract: There is mild adrenal hyperplasia. There is no adrenal mass. There is a stable 1.8 cm homogeneous thin  walled cyst in the superior pole right kidney. There are additional few scattered stable less than 5 mm hypodensities in the bilateral renal cortex which are too small to characterize but most likely cysts. No follow-up imaging is recommended. For reference see JACR 2018 Feb; 264-273, Management of the Incidental Renal Mass on CT, [employer] 2021; 814-848, Bosniak Classification of Cystic Renal Masses, Version 2019. There is no renal mass enhancement and no urinary stone or obstruction. The bladder is not well seen due to pelvic spray and streak artifacts but it is normal in thickness where visible. Stomach/Bowel: There are moderate diffuse thickened folds in the stomach. The small bowel is normal caliber. A 3.2 cm diverticulum is again noted of the third portion of the duodenum just below the neck and body junction of the pancreas. The appendix was previously present and normal. I do not see it on today's scan probably due to extensive pelvic imaging artifacts obscuring it. There is no obvious right lower quadrant inflammatory process. There is moderate to severe fecal stasis. There colonic diverticulosis greatest in the sigmoid without obvious findings of acute diverticulitis although a proximal sigmoid diverticulitis would be obscured due to metal artifacts. Vascular/Lymphatic: There are heavy aortoiliac calcific plaques. IVC filter chronically noted. No AAA is seen. There is less significant visceral branch vessel atherosclerosis. No lymphadenopathy is seen. Reproductive: Enlarged prostate 4.9 cm transverse axis with dystrophic calcifications posteriorly. Size similar to 2021. Other: There is no free air, free hemorrhage or free fluid. Multiple pelvic phleboliths. There are no incarcerated  hernias. Musculoskeletal: Left pelvic fracture fixation plating is again noted with new demonstration of a partially visible left femoral intramedullary fracture fixation rod. There are bridging syndesmophytes and bridging enthesopathy of the thoracolumbar spine throughout. There is osteopenia with a spectrum of degenerative changes of the left-greater-than-right hips. No acute or other significant osseous findings There are bilaterally ankylosed SI joints and lumbar facet hypertrophy. IMPRESSION: 1. No acute inflammatory process is seen in the abdomen or pelvis. Please note however, assessment for sigmoid or other pelvic inflammatory changes is very limited on this study. 2. Diverticulosis with moderate to severe fecal stasis. No bowel obstruction. 3. Gastroenteritis. 4. Prostatomegaly. 5. Aortic and coronary artery atherosclerosis. 6. Left pelvic chronic plating with new demonstration of a partially visible left femoral intramedullary rod. 7. Bridging syndesmophytes and bridging enthesopathy of the thoracolumbar spine, with ankylosed SI joints. Findings could be due to ankylosing spondylitis or other seronegative spondyloarthropathy. 8. Emphysema and cystic changes in the lung bases. Aortic Atherosclerosis (ICD10-I70.0) and Emphysema (ICD10-J43.9). Electronically Signed   By: Almira Bar M.D.   On: 05/05/2023 22:17   DG Chest Port 1 View  Result Date: 05/05/2023 CLINICAL DATA:  Sepsis EXAM: PORTABLE CHEST 1 VIEW COMPARISON:  X-ray 03/26/2023 FINDINGS: Hyperinflation. No consolidation, pneumothorax or effusion. No edema. Normal cardiopericardial silhouette. Overlapping cardiac leads. Fixation hardware along the lower cervical spine at the edge of the imaging field. IMPRESSION: Hyperinflation.  No acute cardiopulmonary disease. Electronically Signed   By: Karen Kays M.D.   On: 05/05/2023 17:07    IMPRESSION:  *Melena:  Had a black stool the morning after his hospital discharge.  Hgb 9.0 grams, which is  fairly stable. EGD on 5/10 that showed grade D esophagitis, large 2 cm non-bleeding gastric ulcer, and 1 cm non-bleeding duodenal ulcer. On pantoprazole 40 mg IV BID here and has been placed back on carafate suspension four times daily.  No further BMs here.  PLAN: -Continue pantoprazole 40 mg BID  and carafate 4 times daily. -Trend Hgb and transfuse prn. -? Plan for repeat EGD per Dr. Myrtie Neither.  Would not be until 5/16 if warranted at all.  Will place diet as appropriate after decision is made about any procedure.   Princella Pellegrini. Zehr  05/06/2023, 10:08 AM     I have taken an interval history, thoroughly reviewed the chart and examined the patient. I agree with the Advanced Practitioner's note, impression and recommendations, and have recorded additional findings, impressions and recommendations below. I performed a substantive portion of this encounter (>50% time spent), including a complete performance of the medical decision making.  My additional thoughts are as follows:   [name] did not wish to speak to me much today, he seems focused on having something to eat and getting back to his nursing home "to pick up my stuff". He would not tell me whether or not he had had a bowel movement or any black stool today.  He reportedly had a black stool according to his home health nurse yesterday, he has not had any witnessed melena since admission overnight.  Known to have multiple ulcers on recent inpatient upper endoscopy.  There also seems to be some confusion about whether or not he has been taking oral anticoagulation since the recent discharge, but is near as we know he is no longer supposed to be on it.  His hemoglobin appears to be similar to what it was when he left the hospital.  It was 8.2 on 05/04/2023, 9.0 on admission last evening, and 9.0 this morning.  (The 10.2 value last evening was most likely an i-STAT, which is less accurate than a CBC)   I do not think he has recurrent  bleeding from the ulcers at this point.  If he has no witnessed melena by tomorrow, I think he can be discharged home to continue twice daily PPI and other recommendations given at the time of recent discharge.  [name] [phone]

## 2023-05-06 NOTE — H&P (Incomplete)
Date: 05/05/2023               Patient Name:  [name] MRN: [id number]  DOB: Oct 23, 1956 Age / Sex: 67 y.o., male   PCP: Pcp, No         Medical Service: Internal Medicine Teaching Service         Attending Physician: Dr. Gloris Manchester, MD    First Contact: Willette Cluster, MD Pager: TR [phone]  Second Contact: Rudene Christians, DO Pager: Milinda Pointer [phone]       After Hours (After 5p/  First Contact Pager: [phone]  weekends / holidays): Second Contact Pager: [phone]   SUBJECTIVE   Chief Complaint: abdominal pain  History of Present Illness: [name] is a 67 year old male with hypertension, hyperlipidemia, diabetes, history of peptic ulcer disease, and recent admission for upper GI bleed who presented to the ED for evaluation of recurrence of bloody stools and abdominal pain.  Patient states that his bloody stools had subsided by the time he left the hospital yesterday and he had had some normal bowel movements.  Unfortunately, this morning he started having dark stools again and is also develop some upper abdominal pain.  He denies any pain with eating.  Denies any dizziness or lightheadedness.  He reports he is very hungry and has not eaten all day.  Denies any bright red blood in his stool.  Denies nausea or vomiting.  Continues to endorse a significant amount of pain in his legs when they spasm.  There is a sharp shooting pain that goes all the way down both legs.  He states is a chronic problem and has gone at least since his recent femur surgery.  Reports that his home pain regimen is inadequate.   ED Course:  Meds:  Tylenol 3 25 mg every 6 hours as needed Eliquis 2.5 mg twice daily Docusate sodium 100 mg daily at bedtime Gabapentin 800 mg 3 times daily Losartan 25 mg daily Methocarbamol 500 mg 2 tablets 3 times daily Mirtazapine 15 mg nightly Pantoprazole 40 mg twice daily MiraLAX 17 g daily Sucralfate 10 mL 4 times daily with meals and at bedtime  Past Medical History:   Diagnosis Date  . Cellulitis 07/17/2020   LEFT HIP  . Diabetes mellitus   . Dyspnea   . Hyperlipidemia   . Hypertension     Past Surgical History:  Procedure Laterality Date  . BIOPSY  05/01/2023   Procedure: BIOPSY;  Surgeon: Beverley Fiedler, MD;  Location: [facility] ENDOSCOPY;  Service: Gastroenterology;;  . ESOPHAGOGASTRODUODENOSCOPY (EGD) WITH PROPOFOL N/A 05/01/2023   Procedure: ESOPHAGOGASTRODUODENOSCOPY (EGD) WITH PROPOFOL;  Surgeon: Beverley Fiedler, MD;  Location: [facility] ENDOSCOPY;  Service: Gastroenterology;  Laterality: N/A;  . FEMUR IM NAIL Left 03/27/2023   Procedure: INTRAMEDULLARY (IM) RETROGRADE FEMORAL NAILING;  Surgeon: Myrene Galas, MD;  Location: [facility] OR;  Service: Orthopedics;  Laterality: Left;  . SPINE SURGERY      Social:  Lives alone at home, nephew Harlan Stains comes twice a day. Is bedbound at home. Smokes tobacco 1/2 PPD. Has smoked since he was a teenager.  Has good support via his nephew.  Bedbound since last discharge.   Does not have PCP.  Family History: No pertinent family history.  Allergies: Allergies as of 05/05/2023 - Review Complete 05/05/2023  Allergen Reaction Noted  . Lipitor [atorvastatin calcium] Other (See Comments) 01/28/2012    Review of Systems: A complete ROS was negative except as per HPI.   OBJECTIVE:   Physical  Exam: Blood pressure 108/63, pulse 80, temperature 98.5 F (36.9 C), temperature source Rectal, resp. rate 14, height 5\' 10"  (1.778 m), weight 63.5 kg, SpO2 100 %.  Constitutional: well-appearing *** sitting in ***, in no acute distress HENT: normocephalic atraumatic, mucous membranes moist Eyes: conjunctiva non-erythematous Neck: supple Cardiovascular: regular rate and rhythm, no m/r/g Pulmonary/Chest: normal work of breathing on room air, lungs clear to auscultation bilaterally Abdominal: soft, non-tender, non-distended MSK: normal bulk and tone Neurological: alert & oriented x 3, 5/5 strength in bilateral upper and lower  extremities, normal gait Skin: warm and dry Psych: ***  Labs: CBC    Component Value Date/Time   WBC 12.0 (H) 05/05/2023 1720   RBC 2.92 (L) 05/05/2023 1720   HGB 10.2 (L) 05/05/2023 1751   HGB 10.2 (L) 05/05/2023 1751   HCT 30.0 (L) 05/05/2023 1751   HCT 30.0 (L) 05/05/2023 1751   PLT 387 05/05/2023 1720   MCV 100.3 (H) 05/05/2023 1720   MCV 87.3 01/28/2012 1409   MCH 30.8 05/05/2023 1720   MCHC 30.7 05/05/2023 1720   RDW 15.9 (H) 05/05/2023 1720   LYMPHSABS 1.8 05/05/2023 1720   MONOABS 0.6 05/05/2023 1720   EOSABS 0.3 05/05/2023 1720   BASOSABS 0.1 05/05/2023 1720     CMP     Component Value Date/Time   NA 141 05/05/2023 1751   NA 140 05/05/2023 1751   K 4.4 05/05/2023 1751   K 4.4 05/05/2023 1751   CL 106 05/05/2023 1751   CO2 24 05/05/2023 1720   GLUCOSE 189 (H) 05/05/2023 1751   BUN 39 (H) 05/05/2023 1751   CREATININE 1.00 05/05/2023 1751   CREATININE 1.09 01/28/2012 1406   CALCIUM 8.8 (L) 05/05/2023 1720   PROT 6.7 05/05/2023 1720   ALBUMIN 2.8 (L) 05/05/2023 1720   AST 29 05/05/2023 1720   ALT 18 05/05/2023 1720   ALKPHOS 110 05/05/2023 1720   BILITOT 0.8 05/05/2023 1720   GFRNONAA >60 05/05/2023 1720   GFRAA >60 09/12/2020 1500    Imaging: CT ABDOMEN PELVIS W CONTRAST  Result Date: 05/05/2023 CLINICAL DATA:  Generalized abdominal pain. EXAM: CT ABDOMEN AND PELVIS WITH CONTRAST TECHNIQUE: Multidetector CT imaging of the abdomen and pelvis was performed using the standard protocol following bolus administration of intravenous contrast. RADIATION DOSE REDUCTION: This exam was performed according to the departmental dose-optimization program which includes automated exposure control, adjustment of the mA and/or kV according to patient size and/or use of iterative reconstruction technique. CONTRAST:  75mL OMNIPAQUE IOHEXOL 350 MG/ML SOLN COMPARISON:  CT chest, abdomen and pelvis with IV contrast 09/11/2020. FINDINGS: Technical note: Pelvic imaging is limited  due to extensive beam hardening and streak artifacts related patient's overlying arms, and due to left pelvic and left femoral shaft fracture fixation hardware. In addition, the study was performed with patient positioned right posterior oblique. Lower chest: Lung bases again show emphysematous changes and multifocal cystic changes in the right-greater-than-left lower lobes, base of the right middle lobe. Mild chronic elevation right hemidiaphragm. Asymmetric right lower lobe atelectasis related to positioning. The cardiac size is normal. There is a small chronic pericardial effusion anteriorly and chronic three-vessel coronary artery calcific plaques. Hepatobiliary: There are stable scattered subcentimeter hepatic cysts. There is no mass enhancement of the liver. The gallbladder and bile ducts are unremarkable. Pancreas: Partially atrophic.  Otherwise unremarkable. Spleen: No abnormality. Adrenals/Urinary Tract: There is mild adrenal hyperplasia. There is no adrenal mass. There is a stable 1.8 cm homogeneous thin walled cyst in  the superior pole right kidney. There are additional few scattered stable less than 5 mm hypodensities in the bilateral renal cortex which are too small to characterize but most likely cysts. No follow-up imaging is recommended. For reference see JACR 2018 Feb; 264-273, Management of the Incidental Renal Mass on CT, [employer] 2021; 814-848, Bosniak Classification of Cystic Renal Masses, Version 2019. There is no renal mass enhancement and no urinary stone or obstruction. The bladder is not well seen due to pelvic spray and streak artifacts but it is normal in thickness where visible. Stomach/Bowel: There are moderate diffuse thickened folds in the stomach. The small bowel is normal caliber. A 3.2 cm diverticulum is again noted of the third portion of the duodenum just below the neck and body junction of the pancreas. The appendix was previously present and normal. I do not see it on  today's scan probably due to extensive pelvic imaging artifacts obscuring it. There is no obvious right lower quadrant inflammatory process. There is moderate to severe fecal stasis. There colonic diverticulosis greatest in the sigmoid without obvious findings of acute diverticulitis although a proximal sigmoid diverticulitis would be obscured due to metal artifacts. Vascular/Lymphatic: There are heavy aortoiliac calcific plaques. IVC filter chronically noted. No AAA is seen. There is less significant visceral branch vessel atherosclerosis. No lymphadenopathy is seen. Reproductive: Enlarged prostate 4.9 cm transverse axis with dystrophic calcifications posteriorly. Size similar to 2021. Other: There is no free air, free hemorrhage or free fluid. Multiple pelvic phleboliths. There are no incarcerated hernias. Musculoskeletal: Left pelvic fracture fixation plating is again noted with new demonstration of a partially visible left femoral intramedullary fracture fixation rod. There are bridging syndesmophytes and bridging enthesopathy of the thoracolumbar spine throughout. There is osteopenia with a spectrum of degenerative changes of the left-greater-than-right hips. No acute or other significant osseous findings There are bilaterally ankylosed SI joints and lumbar facet hypertrophy. IMPRESSION: 1. No acute inflammatory process is seen in the abdomen or pelvis. Please note however, assessment for sigmoid or other pelvic inflammatory changes is very limited on this study. 2. Diverticulosis with moderate to severe fecal stasis. No bowel obstruction. 3. Gastroenteritis. 4. Prostatomegaly. 5. Aortic and coronary artery atherosclerosis. 6. Left pelvic chronic plating with new demonstration of a partially visible left femoral intramedullary rod. 7. Bridging syndesmophytes and bridging enthesopathy of the thoracolumbar spine, with ankylosed SI joints. Findings could be due to ankylosing spondylitis or other seronegative  spondyloarthropathy. 8. Emphysema and cystic changes in the lung bases. Aortic Atherosclerosis (ICD10-I70.0) and Emphysema (ICD10-J43.9). Electronically Signed   By: Almira Bar M.D.   On: 05/05/2023 22:17   DG Chest Port 1 View  Result Date: 05/05/2023 CLINICAL DATA:  Sepsis EXAM: PORTABLE CHEST 1 VIEW COMPARISON:  X-ray 03/26/2023 FINDINGS: Hyperinflation. No consolidation, pneumothorax or effusion. No edema. Normal cardiopericardial silhouette. Overlapping cardiac leads. Fixation hardware along the lower cervical spine at the edge of the imaging field. IMPRESSION: Hyperinflation.  No acute cardiopulmonary disease. Electronically Signed   By: Karen Kays M.D.   On: 05/05/2023 17:07    EKG: personally reviewed my interpretation is***  ASSESSMENT & PLAN:   Assessment & Plan by Problem: Active Problems:   * No active hospital problems. *   Zackariah Rupard is a 67 year old male with hypertension, hyperlipidemia, diabetes, history of peptic ulcer disease, and recent admission for upper GI bleed who presented to the ED for evaluation of recurrence of bloody stools admitted for evaluation and treatment of presumed GI bleed.   #  Acute on chronic anemia in the setting of melena Recent admission for blood loss anemia. EGD with nonbleeding gastric and duodenal ulcers as well as nonbleeding grade D reflux esophagitis. *** -Gastroenterology following, appreciate recommendations -N.p.o. at midnight, endoscopy tomorrow with Dr. Rhea Belton -Follow-up every 4 hour H&H, transfuse for Hgb <7 -IV Protonix -Clear liquid diet, n.p.o. at midnight   #Malnutrition Chronic protein calorie malnutrition.  Patient on low-dose mirtazapine 50 mg nightly.  Seems to be a chronic problem for this gentleman and depression may be contributing. -Continue mirtazapine 50 mg nightly   # Type 2 diabetes Patient presents with history of type 2 diabetes.  Last A1c 4.7.  Glucose 129 this admission.  He is not on medication.  His  insulin resistance may have improved with weight loss. -Does not warrant treatment at this time.     # Hypertension Patient presents with a history of hypertension.  His blood pressure this admission has ranged between normotensive and hypotensive.  Will hold antihypertensives for now. -Hold home losartan   #Sacral pressure ulcers Patient presents with multiple pressure ulcers about his sacral area.  Unsure as to how long these have been here though nephew does report that the patient has only been bedridden since recent discharge. -Consult wound care  Diet: {NAMES:3044014::"Normal","Heart Healthy","Carb-Modified","Renal","Carb/Renal","NPO","TPN","Tube Feeds"} VTE: {NAMES:3044014::"Heparin","Enoxaparin","SCDs","NOAC","None"} IVF: {NAMES:3044014::"None","NS","1/2 NS","LR","D5","D10"},{NAMES:3044014::"None","10cc/hr","25cc/hr","50cc/hr","75cc/hr","100cc/hr","110cc/hr","125cc/hr","Bolus"} Code: {NAMES:3044014::"Full","DNR","DNI","DNR/DNI","Comfort Care","Unknown"}  Prior to Admission Living Arrangement: {NAMES:3044014::"Home, living ***","SNF, ***","Homeless","***"} Anticipated Discharge Location: {NAMES:3044014::"Home","SNF","CIR","***"} Barriers to Discharge: ***  Dispo: Admit patient to Observation with expected length of stay less than 2 midnights.  Signed: Adron Bene, MD Internal Medicine Resident PGY-1  05/05/2023, 10:50 PM

## 2023-05-06 NOTE — Progress Notes (Signed)
Physical Therapy Evaluation Patient Details Name: [name] MRN: [id number] DOB: Apr 30, 1956 Today's Date: 05/06/2023  History of Present Illness  Pt is a 67 y.o. male presenting 5/14 with abd pain, Leg pain, and blood in stool. PMH includes DM, deconditioning, bedbound at baseline, malnutrition, anemia, PUD, paraplegia. Admitted 5/9 - 5/13 for GI bleed, L hip IM nailing 4/5  Clinical Impression  Pt was seen for mobility to get to side of bed and back, declines to work on bed to chair transition for now.  Pt per chart has been fairly bedbound, and has reduced LE strength and contractures from a recent standpoint.  Pt is fairly reluctant to move, but until his fall with L hip IM nail was doing transfers bed to chair.  Will recommend <3 hours a day rehab to work on improving his functional use of LE's with reduction of contractures and retraining transfers to PLOF.  Follow acutely for PT goals as his POC outlines below.       Recommendations for follow up therapy are one component of a multi-disciplinary discharge planning process, led by the attending physician.  Recommendations may be updated based on patient status, additional functional criteria and insurance authorization.  Follow Up Recommendations Can patient physically be transported by private vehicle: No     Assistance Recommended at Discharge Frequent or constant Supervision/Assistance  Patient can return home with the following  Two people to help with walking and/or transfers;A lot of help with bathing/dressing/bathroom;Assistance with cooking/housework;Direct supervision/assist for medications management;Direct supervision/assist for financial management;Assist for transportation;Help with stairs or ramp for entrance    Equipment Recommendations None recommended by PT  Recommendations for Other Services       Functional Status Assessment Patient has not had a recent decline in their functional status     Precautions /  Restrictions Precautions Precautions: Fall;Other (comment) Precaution Comments: bil knee flexion contractures Restrictions Weight Bearing Restrictions: No LUE Weight Bearing: Weight bearing as tolerated RLE Weight Bearing: Weight bearing as tolerated LLE Weight Bearing: Weight bearing as tolerated      Mobility  Bed Mobility Overal bed mobility: Needs Assistance Bed Mobility: Supine to Sit, Sit to Supine     Supine to sit: Total assist Sit to supine: Total assist   General bed mobility comments: Pt is not able to use UE's to assist with any effective mobility    Transfers Overall transfer level: Needs assistance                 General transfer comment: refused to attempt any transition to the chair    Ambulation/Gait               General Gait Details: not ambulatory  Stairs            Wheelchair Mobility    Modified Rankin (Stroke Patients Only)       Balance Overall balance assessment: History of Falls, Needs assistance Sitting-balance support: Feet supported Sitting balance-Leahy Scale: Poor                                       Pertinent Vitals/Pain Pain Assessment Pain Assessment: Faces Faces Pain Scale: Hurts even more Pain Location: left hip Pain Descriptors / Indicators: Guarding, Grimacing Pain Intervention(s): Limited activity within patient's tolerance, Monitored during session, Premedicated before session, Repositioned    Home Living Family/patient expects to be discharged to:: Skilled nursing facility Living  Arrangements: Alone Available Help at Discharge: Family;Available PRN/intermittently Type of Home: Mobile home Home Access: Ramped entrance       Home Layout: One level Home Equipment: Agricultural consultant (2 wheels);Shower seat;Wheelchair - manual;BSC/3in1;Hospital bed;Other (comment) Additional Comments: previously had nephew helping twice a day but is not communicating with him    Prior Function  Prior Level of Function : Needs assist       Physical Assist : Mobility (physical) Mobility (physical): Bed mobility;Transfers   Mobility Comments: has been non ambulatory for several years and now is unable to transfer to chair due to hip fracture, declining to get to chair at eval ADLs Comments: total care     Hand Dominance   Dominant Hand: Right    Extremity/Trunk Assessment   Upper Extremity Assessment Upper Extremity Assessment: Generalized weakness    Lower Extremity Assessment Lower Extremity Assessment: Generalized weakness (B flexion contractures of knees and hips) RLE Deficits / Details: flexion contracture and no active movement of legs LLE Deficits / Details: flexion contractures of legs and no active movement of legs    Cervical / Trunk Assessment Cervical / Trunk Assessment: Kyphotic  Communication   Communication: No difficulties  Cognition Arousal/Alertness: Awake/alert Behavior During Therapy: Agitated Overall Cognitive Status: No family/caregiver present to determine baseline cognitive functioning (brother is present but pt will not let PT talk with him) Area of Impairment: Following commands, Safety/judgement, Awareness, Problem solving                       Following Commands: Follows one step commands inconsistently, Follows one step commands with increased time Safety/Judgement: Decreased awareness of safety, Decreased awareness of deficits Awareness: Intellectual Problem Solving: Slow processing, Requires verbal cues, Requires tactile cues General Comments: pt is refusing to try to transfer to chair, declining to let PT do more thansit him on side of bed and get back to bed        General Comments General comments (skin integrity, edema, etc.): Pt was assisted to side of bed, unable to move from this posture and declining to let PT help him to chair    Exercises     Assessment/Plan    PT Assessment Patient needs continued PT  services  PT Problem List Decreased strength;Decreased range of motion;Decreased activity tolerance;Decreased balance;Decreased mobility;Decreased knowledge of use of DME;Pain       PT Treatment Interventions Functional mobility training;Therapeutic activities;Therapeutic exercise;Balance training;Neuromuscular re-education;Patient/family education    PT Goals (Current goals can be found in the Care Plan section)  Acute Rehab PT Goals Patient Stated Goal: to go to SNF PT Goal Formulation: With patient Time For Goal Achievement: 05/20/23 Potential to Achieve Goals: Fair    Frequency Min 2X/week     Co-evaluation               AM-PAC PT "6 Clicks" Mobility  Outcome Measure Help needed turning from your back to your side while in a flat bed without using bedrails?: A Lot Help needed moving from lying on your back to sitting on the side of a flat bed without using bedrails?: A Lot Help needed moving to and from a bed to a chair (including a wheelchair)?: Total Help needed standing up from a chair using your arms (e.g., wheelchair or bedside chair)?: Total Help needed to walk in hospital room?: Total Help needed climbing 3-5 steps with a railing? : Total 6 Click Score: 8    End of Session   Activity Tolerance:  Patient limited by fatigue;Patient limited by pain Patient left: in bed;with call bell/phone within reach;with family/visitor present Nurse Communication: Mobility status;Need for lift equipment PT Visit Diagnosis: Other abnormalities of gait and mobility (R26.89);Muscle weakness (generalized) (M62.81);History of falling (Z91.81);Pain Pain - Right/Left: Left Pain - part of body: Hip    Time: 1610-9604 PT Time Calculation (min) (ACUTE ONLY): 24 min   Charges:   PT Evaluation $PT Eval Moderate Complexity: 1 Mod PT Treatments $Therapeutic Activity: 8-22 mins       Ivar Drape 05/06/2023, 12:44 PM  Samul Dada, PT PhD Acute Rehab Dept. Number: [facility] [id number]  and [facility] [phone]

## 2023-05-06 NOTE — Plan of Care (Signed)

## 2023-05-06 NOTE — Progress Notes (Signed)
Subjective:  Currently, the patient reports pain of the legs, back, and abdomen. Notes that he had a bloody bowel movement that was dark in quality <24 hours prior to leaving the hospital on 05/13. Also has an appetite but is NPO at this time prior to GI evaluation. Has limited support at home and was brought to hospital only after being found by home health nurse. Interested in being placed in nursing home upon discharge from hospital.   Interval Events: - None    Objective:  Vital Signs:   Temp: 98 F (36.7 C)  Pulse Rate:  [72-93] 79  Resp:  [14-19] 16  BP: (81-125)/(58-68) 118/63  SpO2:  [92 %-100 %] 100 % Weight:  [63.5 kg] 63.5 kg  Last BM Date : 05/05/23  Physical Exam: General: Chronically ill-appearing male sitting in hospital bed, in no acute distress   Lungs:  Normal respiratory effort on RA. Clear to auscultation BL without crackles or wheezes.  Heart: RRR. S1 and S2 normal without gallop, murmur, or rubs.  Abdomen:  Soft, non-distended. Mild epigastric tenderness noted.  Extremities: No pretibial edema.     Labs:  Basic Metabolic Panel: Recent Labs  Lab 04/30/23 1555 04/30/23 1614 05/01/23 0611 05/03/23 0306 05/05/23 1720 05/05/23 1751  NA 137 138 138 137 138 140  141  K 4.6 4.5 4.1 4.1 4.2 4.4  4.4  CL 109 108 107 105 105 106  CO2 20*  --  20* 21* 24  --   GLUCOSE 131* 129* 80 144* 188* 189*  BUN 60* 53* 46* 30* 28* 39*  CREATININE 1.32* 1.40* 1.10 0.84 0.92 1.00  CALCIUM 8.0*  --  8.1* 8.5* 8.8*  --   MG  --   --   --   --  2.1  --    CBC: Recent Labs  Lab 04/30/23 1555 04/30/23 1614 05/02/23 0040 05/03/23 0306 05/04/23 0154 05/05/23 1720 05/05/23 1751 05/06/23 0614  WBC 11.5*   < > 16.9* 11.2* 11.2* 12.0*  --  11.3*  NEUTROABS 8.4*  --   --   --   --  9.1*  --   --   HGB 7.1*   < > 7.7* 9.4* 8.2* 9.0* 10.2*  10.2* 9.0*  HCT 23.2*   < > 23.8* 29.2* 25.9* 29.3* 30.0*  30.0* 29.2*  MCV 102.2*   < > 96.7 97.7 98.5 100.3*  --  102.5*   PLT 355   < > 247 262 272 387  --  321   < > = values in this interval not displayed.    Coagulation Studies: Recent Labs    05/05/23 1720 05/06/23 0614  LABPROT 14.1 14.3  INR 1.1 1.1     Assessment & Plan:  [name] is a 67 year old male with history of peptic ulcer disease and recent hospitalization for upper GI bleed, admitted for evaluation of recurrent blood stools secondary to presumed GI bleed.   #Acute on chronic anemia  melena Patient presents with recurrent melena and upper abdominal pain. Most likely etiology is gastric/duodenal ulcers as noted on EGD during prior admissions. Low concern for high-volume bleed. Hemoglobin 9.0 today, modestly improved from 8.2 at last discharge. CTAP notes diverticulosis, which may also be contributing to blood loss though would expect painless, bright red/maroon bowel movements.  -Gastroenterology to see, appreciate recommendations -N.p.o. overnight -Daily CBC, transfuse for Hgb <7 -IV Protonix -Sucralfate 1 g with meals and at bedtime -Discontinue Eliquis   #Ankylosing spondylitis  Lower  extremity muscle spasms Patient has a history of muscle spasms, back/leg pain in the setting of known degenerative lumbar disease and s/p L leg fracture 1 month ago. CTAP concerning for ankylosing spondylitis, which may also be contributing to his pain. His mobility is now very limited due to injury and he is no longer able to transfer out of a wheel chair. Patient was resistant to SNF placement as recommended by PT/OT during prior admission but is now open to being discharged to a facility.  -Continue home gabapentin 800 mg 3 times daily -Continue home Robaxin 1000 mg 3 times daily -Continue tylenol 650 mg PO q6h PRN    #Malnutrition Chronic protein calorie malnutrition and deconditioning due to immobility s/p leg fracture 1 month ago.  -Continue mirtazapine 50 mg nightly -Consult PT/OT   #Hypertension Patient presents with a history of  hypertension. Blood pressure was initially low on admission likely secondary to hypovolemia. Improved this AM after holding home losartan and administering 1L LR bolus. -Hold home losartan   #Sacral pressure ulcers Patient presents with multiple pressure ulcers about his sacral area.  Patient has been bedridden since discharge on 04/10. Was evaluated by wound care during prior admission and will continue to follow recommendations. -Prevalon boots -Low air loss mattress  Length of Stay: 0 day(s)  Signed:  Lyda Kalata, Medical Student 05/06/2023, 9:53 AM

## 2023-05-06 NOTE — TOC Initial Note (Addendum)
Transition of Care [facility]) - Initial/Assessment Note    Patient Details  Name: [name] MRN: [id number] Date of Birth: 1956/09/18  Transition [facility]) CM/SW Contact:    Durenda Guthrie, RN Phone Number: 05/06/2023, 12:06 PM  Clinical Narrative:                  Patient was recently discharged on 5/13 after treatment for same condition. Hgb 9.0 today.  CM contacted [place] with [facility] to inform of readmission.   Transition of Care [facility]) Department has reviewed patient and no TOC needs have been identified at this time. We will continue to monitor patient advancement through Interdisciplinary progressions and if new patient needs arise, please place a consult.       Patient Goals and CMS Choice            Expected Discharge Plan and Services                                              Prior Living Arrangements/Services                       Activities of Daily Living Home Assistive Devices/Equipment: Hoyer Lift ADL Screening (condition at time of admission) Patient's cognitive ability adequate to safely complete daily activities?: Yes Is the patient deaf or have difficulty hearing?: No Does the patient have difficulty seeing, even when wearing glasses/contacts?: No Does the patient have difficulty concentrating, remembering, or making decisions?: No Patient able to express need for assistance with ADLs?: Yes Does the patient have difficulty dressing or bathing?: Yes Independently performs ADLs?: No Communication: Dependent Is this a change from baseline?: Pre-admission baseline Dressing (OT): Dependent Is this a change from baseline?: Pre-admission baseline Grooming: Dependent Is this a change from baseline?: Pre-admission baseline Feeding: Independent Bathing: Dependent Is this a change from baseline?: Pre-admission baseline Toileting: Dependent Is this a change from baseline?: Pre-admission baseline In/Out Bed:  Dependent Is this a change from baseline?: Pre-admission baseline Walks in Home: Dependent Is this a change from baseline?: Pre-admission baseline Does the patient have difficulty walking or climbing stairs?: Yes Weakness of Legs: Both Weakness of Arms/Hands: Both  Permission Sought/Granted                  Emotional Assessment              Admission diagnosis:  Melena [K92.1] Generalized abdominal pain [R10.84] Acute upper GI bleed [K92.2] Patient Active Problem List   Diagnosis Date Noted   Secondary hypertension 05/06/2023   Acute upper GI bleed 05/05/2023   Paraplegia (HCC) 05/04/2023   Protein-calorie malnutrition, severe 05/02/2023   Decubitus ulcer of sacral region, unstageable (HCC) 05/01/2023   Acute esophagitis 05/01/2023   Gastric ulcer 05/01/2023   Duodenal ulcer 05/01/2023   Acute GI bleeding 04/30/2023   Acute blood loss anemia 04/30/2023   Melena 04/30/2023   History of gastric ulcer 04/30/2023   AKI (acute kidney injury) (HCC) 03/27/2023   Traumatic rhabdomyolysis (HCC) 03/27/2023   Closed femur fracture (HCC) 03/26/2023   Malnutrition of moderate degree 09/11/2020   Loss of weight 09/07/2020   Candidal intertrigo 09/07/2020   Fall at home 09/06/2020   Hypoglycemia 07/31/2020   Purulent Cellulitis of left hip 07/15/2020   Recurrent falls 07/15/2020   Frailty syndrome in geriatric patient 07/15/2020  Physical deconditioning 07/15/2020   Asymptomatic bacteriuria 07/15/2020   Diabetes mellitus (HCC) 01/28/2012   PCP:  Pcp, No Pharmacy:   [id number] [facility], [place] - [address] [place] [address] Phone: [phone] Fax: [phone]  [facility] [address] [place] [address] Phone: [phone] Fax: [phone]     Social Determinants of Health (SDOH) Social History: SDOH Screenings   Food Insecurity: No Food Insecurity (05/01/2023)  Housing:  Low Risk  (05/01/2023)  Transportation Needs: No Transportation Needs (05/01/2023)  Utilities: Not At Risk (05/01/2023)  Tobacco Use: High Risk (05/04/2023)   SDOH Interventions:     Readmission Risk Interventions    05/04/2023    1:50 PM 09/17/2020    3:07 PM  Readmission Risk Prevention Plan  Post Dischage Appt  Not Complete  Appt Comments  SNF placement  Medication Screening  Complete  Transportation Screening Complete Complete  PCP or Specialist Appt within 5-7 Days Complete   Home Care Screening Complete   Medication Review (RN CM) Referral to Pharmacy
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
## 2023-05-07 DIAGNOSIS — M62838 Other muscle spasm: Secondary | ICD-10-CM | POA: Diagnosis not present

## 2023-05-07 DIAGNOSIS — K922 Gastrointestinal hemorrhage, unspecified: Secondary | ICD-10-CM | POA: Diagnosis not present

## 2023-05-07 DIAGNOSIS — M79606 Pain in leg, unspecified: Secondary | ICD-10-CM

## 2023-05-07 DIAGNOSIS — L89159 Pressure ulcer of sacral region, unspecified stage: Secondary | ICD-10-CM

## 2023-05-07 DIAGNOSIS — D62 Acute posthemorrhagic anemia: Secondary | ICD-10-CM

## 2023-05-07 DIAGNOSIS — I10 Essential (primary) hypertension: Secondary | ICD-10-CM

## 2023-05-07 DIAGNOSIS — E46 Unspecified protein-calorie malnutrition: Secondary | ICD-10-CM

## 2023-05-07 DIAGNOSIS — M549 Dorsalgia, unspecified: Secondary | ICD-10-CM | POA: Diagnosis not present

## 2023-05-07 DIAGNOSIS — E119 Type 2 diabetes mellitus without complications: Secondary | ICD-10-CM

## 2023-05-07 LAB — CBC
HCT: 30.4 % — ABNORMAL LOW (ref 39.0–52.0)
Hemoglobin: 9.8 g/dL — ABNORMAL LOW (ref 13.0–17.0)
MCH: 31.9 pg (ref 26.0–34.0)
MCHC: 32.2 g/dL (ref 30.0–36.0)
MCV: 99 fL (ref 80.0–100.0)
Platelets: 320 10*3/uL (ref 150–400)
RBC: 3.07 MIL/uL — ABNORMAL LOW (ref 4.22–5.81)
RDW: 15.6 % — ABNORMAL HIGH (ref 11.5–15.5)
WBC: 9.8 10*3/uL (ref 4.0–10.5)
nRBC: 0 % (ref 0.0–0.2)

## 2023-05-07 LAB — GLUCOSE, CAPILLARY
Glucose-Capillary: 77 mg/dL (ref 70–99)
Glucose-Capillary: 204 mg/dL — ABNORMAL HIGH (ref 70–99)
Glucose-Capillary: 127 mg/dL — ABNORMAL HIGH (ref 70–99)

## 2023-05-07 LAB — CULTURE, BLOOD (ROUTINE X 2)

## 2023-05-07 MED ORDER — SENNOSIDES-DOCUSATE SODIUM 8.6-50 MG PO TABS
1.0000 | ORAL_TABLET | Freq: Every day | ORAL | Status: DC
  Filled 2023-05-07: qty 1

## 2023-05-07 MED ORDER — PANTOPRAZOLE SODIUM 40 MG PO TBEC
40.0000 mg | DELAYED_RELEASE_TABLET | Freq: Two times a day (BID) | ORAL | Status: DC
  Administered 2023-05-07 – 2023-05-08 (×3): 40 mg via ORAL
  Filled 2023-05-07 (×3): qty 1

## 2023-05-07 MED ORDER — OXYCODONE HCL 5 MG PO TABS
5.0000 mg | ORAL_TABLET | Freq: Once | ORAL | Status: AC
  Administered 2023-05-07: 5 mg via ORAL
  Filled 2023-05-07: qty 1

## 2023-05-07 MED ORDER — POLYETHYLENE GLYCOL 3350 17 G PO PACK
17.0000 g | PACK | Freq: Every day | ORAL | Status: DC
  Filled 2023-05-07 (×2): qty 1

## 2023-05-07 MED ORDER — OXYCODONE HCL 5 MG PO TABS
5.0000 mg | ORAL_TABLET | Freq: Four times a day (QID) | ORAL | Status: DC | PRN
  Administered 2023-05-07 – 2023-05-08 (×5): 5 mg via ORAL
  Filled 2023-05-07 (×5): qty 1

## 2023-05-07 NOTE — Progress Notes (Signed)
Pt requesting something for pain, states legs/ muscle spasms and stomach pain were 10/10. Gave scheduled gabapentin, robaxin and prn tylenol. Pain now 7/10; no other prns available. Provider paged.

## 2023-05-07 NOTE — TOC Progression Note (Addendum)
Transition of Care [facility]) - Progression Note    Patient Details  Name: [name] MRN: [id number] Date of Birth: 09-22-1956  Transition of Care [facility]) CM/SW Contact  Carley Hammed, LCSW Phone Number: 05/07/2023, 12:14 PM  Clinical Narrative:     CSW  met with pt at bedside and provided bed offers. Pt has experiences with many of the facilities. Pt states he is interested in [facility]. Pt's friend is at bedside and pt requests that he go tour facility prior to accepting. CSW will check back in for final choice. [facility] beds today, authorization started. Pt is medically ready to DC. TOC will continue to follow for DC needs.    4:00 Authorization is still pending. Medical team is updated to barriers. TOC will continue to follow for DC needs.  Expected Discharge Plan: Skilled Nursing Facility Barriers to Discharge: Continued Medical Work up, English as a second language teacher, SNF Pending bed offer  Expected Discharge Plan and Services     Post Acute Care Choice: Skilled Nursing Facility Living arrangements for the past 2 months: Single Family Home                                       Social Determinants of Health (SDOH) Interventions SDOH Screenings   Food Insecurity: No Food Insecurity (05/01/2023)  Housing: Low Risk  (05/01/2023)  Transportation Needs: No Transportation Needs (05/01/2023)  Utilities: Not At Risk (05/01/2023)  Tobacco Use: High Risk (05/04/2023)    Readmission Risk Interventions    05/04/2023    1:50 PM 09/17/2020    3:07 PM  Readmission Risk Prevention Plan  Post Dischage Appt  Not Complete  Appt Comments  SNF placement  Medication Screening  Complete  Transportation Screening Complete Complete  PCP or Specialist Appt within 5-7 Days Complete   Home Care Screening Complete   Medication Review (RN CM) Referral to Pharmacy

## 2023-05-07 NOTE — Care Management Obs Status (Signed)
MEDICARE OBSERVATION STATUS NOTIFICATION   Patient Details  Name: [name] MRN: [id number] Date of Birth: 1956-11-18   Medicare Observation Status Notification Given:  Yes    Carley Hammed, LCSW 05/07/2023, 1:30 PM

## 2023-05-07 NOTE — Plan of Care (Signed)
  Problem: Education: Goal: Knowledge of General Education information will improve Description: Including pain rating scale, medication(s)/side effects and non-pharmacologic comfort measures Outcome: Progressing   Problem: Health Behavior/Discharge Planning: Goal: Ability to manage health-related needs will improve Outcome: Progressing   Problem: Activity: Goal: Risk for activity intolerance will decrease Outcome: Progressing   Problem: Coping: Goal: Level of anxiety will decrease Outcome: Progressing   Problem: Safety: Goal: Ability to remain free from injury will improve Outcome: Progressing   Problem: Skin Integrity: Goal: Risk for impaired skin integrity will decrease Outcome: Progressing   

## 2023-05-07 NOTE — Plan of Care (Signed)

## 2023-05-07 NOTE — Progress Notes (Signed)
Subjective: Still in pain this morning. Had one bloody bowel movement. Looked dark black. Still having muscle spasms. Thinks it's "old blood" and not as dark as prior bleed. Doesn't want to go back to the facility he was at before. Discussed plan for today and answered all questions.   Interval Events: - Received oxycodone 5 mg for pain overnight     Objective:  Vital Signs:   Temp:  98.2 F (36.8 C)  Pulse Rate:  [79-93] 88  Resp:  [16-17] 17  BP: (118-147)/(63-75) 121/70  SpO2:  [99 %-100 %] 99% on RA Weight:  [63.2 kg] 63.2 kg  Last BM Date : 05/05/23  Physical Exam: General: Chronically ill-appearing male sitting in hospital bed, in no acute distress   Lungs:  Normal respiratory effort on RA. Clear to auscultation BL without crackles or wheezes.  Heart: RRR. S1 and S2 normal without gallop, murmur, or rubs.  Abdomen:  Soft, non-distended. Normal bowel sounds.  MSK: Non-erythematous, non-weeping, bandaged sacral ulcers; no pretibial edema.      Labs:  CBC: Recent Labs  Lab 04/30/23 1555 04/30/23 1614 05/03/23 0306 05/04/23 0154 05/05/23 1720 05/05/23 1751 05/06/23 0614 05/07/23 0047  WBC 11.5*   < > 11.2* 11.2* 12.0*  --  11.3* 9.8  NEUTROABS 8.4*  --   --   --  9.1*  --   --   --   HGB 7.1*   < > 9.4* 8.2* 9.0* 10.2*  10.2* 9.0* 9.8*  HCT 23.2*   < > 29.2* 25.9* 29.3* 30.0*  30.0* 29.2* 30.4*  MCV 102.2*   < > 97.7 98.5 100.3*  --  102.5* 99.0  PLT 355   < > 262 272 387  --  321 320   < > = values in this interval not displayed.     Assessment & Plan:  [name] is a 67 year old male with history of peptic ulcer disease and recent hospitalization for upper GI bleed, admitted for evaluation of recurrent blood stools secondary to presumed GI bleed.   #Acute on chronic anemia  melena No signs of ongoing, large-volume bleeds. Dark bloody bowel movement noted yesterday that was less bloody than episode prior to admission. Hgb is stable (9.0>9.8).  Patient is medically stable for discharge and amenable to SNF placement given increased needs for care at home. TOC is working on placement.    -Gastroenterology to see, appreciate recommendations -Daily CBC, transfuse for Hgb <7 -IV Protonix, transition to oral protonix (40 mg BID)   -Sucralfate 1 g with meals and at bedtime -Discontinue Eliquis   #Ankylosing spondylitis  Lower extremity muscle spasms Patient continues to report leg pain, muscle spasms with moderate relief with medications, including PRN oxycodone. He has a long-standing history of similar symptoms in setting known degenerative lumbar disease and left leg fracture. CTAP on admission concerning for ankylosing spondylitis, which may also be contributing to his pain. PT working with patient to improve mobility of LE during hospitalization -Continue home gabapentin 800 mg 3 times daily -Continue home Robaxin 1000 mg 3 times daily -Continue tylenol 650 mg PO q6h PRN  -Start oxycodone 5 mg PO q6h PRN   #Malnutrition Chronic protein calorie malnutrition and deconditioning due to immobility s/p leg fracture 1 month ago. Currently tolerating a soft diet with good appetite.  -Continue mirtazapine 50 mg nightly -Continue soft diet   #Hypertension BP is normal this AM while holding home medications. BP was initially low on admission and has improved with  fluids, PO intake. -Hold home losartan   #Sacral pressure ulcers Patient presents with multiple pressure ulcers about his sacral area. Wounds were re-bandaged yesterday -- no signs of infection or worsening ulcers this morning on exam. He was evaluated by wound care during prior admission and will continue to follow recommendations. -Prevalon boots -Low air loss mattress  Length of Stay: 0 day(s)  Signed:  Lyda Kalata, Medical Student 05/07/2023, 6:02 AM

## 2023-05-08 DIAGNOSIS — M459 Ankylosing spondylitis of unspecified sites in spine: Secondary | ICD-10-CM | POA: Diagnosis not present

## 2023-05-08 DIAGNOSIS — K922 Gastrointestinal hemorrhage, unspecified: Secondary | ICD-10-CM | POA: Diagnosis not present

## 2023-05-08 DIAGNOSIS — D649 Anemia, unspecified: Secondary | ICD-10-CM | POA: Diagnosis not present

## 2023-05-08 DIAGNOSIS — E46 Unspecified protein-calorie malnutrition: Secondary | ICD-10-CM | POA: Diagnosis not present

## 2023-05-08 LAB — CBC
HCT: 29.2 % — ABNORMAL LOW (ref 39.0–52.0)
Hemoglobin: 9.2 g/dL — ABNORMAL LOW (ref 13.0–17.0)
MCH: 31 pg (ref 26.0–34.0)
MCHC: 31.5 g/dL (ref 30.0–36.0)
MCV: 98.3 fL (ref 80.0–100.0)
Platelets: 325 10*3/uL (ref 150–400)
RBC: 2.97 MIL/uL — ABNORMAL LOW (ref 4.22–5.81)
RDW: 15.3 % (ref 11.5–15.5)
WBC: 9.1 10*3/uL (ref 4.0–10.5)
nRBC: 0 % (ref 0.0–0.2)

## 2023-05-08 LAB — CULTURE, BLOOD (ROUTINE X 2)

## 2023-05-08 LAB — GLUCOSE, CAPILLARY
Glucose-Capillary: 90 mg/dL (ref 70–99)
Glucose-Capillary: 108 mg/dL — ABNORMAL HIGH (ref 70–99)
Glucose-Capillary: 179 mg/dL — ABNORMAL HIGH (ref 70–99)

## 2023-05-08 MED ORDER — OXYCODONE HCL 5 MG PO TABS: 5.0000 mg | ORAL_TABLET | Freq: Four times a day (QID) | ORAL | 0 refills | Status: AC | PRN

## 2023-05-08 MED ORDER — OXYCODONE HCL 5 MG PO TABS: 5.0000 mg | ORAL_TABLET | Freq: Four times a day (QID) | ORAL | 0 refills | Status: DC | PRN

## 2023-05-08 MED ORDER — SUCRALFATE 1 GM/10ML PO SUSP: 1.0000 g | Freq: Three times a day (TID) | ORAL | 0 refills | Status: DC

## 2023-05-08 MED ORDER — MEDIHONEY WOUND/BURN DRESSING EX PSTE: 1.0000 | PASTE | Freq: Every day | CUTANEOUS | Status: DC

## 2023-05-08 MED ORDER — SENNOSIDES-DOCUSATE SODIUM 8.6-50 MG PO TABS: 1.0000 | ORAL_TABLET | Freq: Every day | ORAL | Status: DC

## 2023-05-08 NOTE — Plan of Care (Signed)
Problem: Education: Goal: Knowledge of General Education information will improve Description: Including pain rating scale, medication(s)/side effects and non-pharmacologic comfort measures 05/08/2023 1302 by Lilu Mcglown N, RN Outcome: Adequate for Discharge 05/08/2023 1302 by Paislei Dorval N, RN Outcome: Adequate for Discharge 05/08/2023 1124 by Rubie Ficco N, RN Outcome: Progressing   Problem: Health Behavior/Discharge Planning: Goal: Ability to manage health-related needs will improve 05/08/2023 1302 by Zephaniah Lubrano N, RN Outcome: Adequate for Discharge 05/08/2023 1302 by Dametri Ozburn N, RN Outcome: Adequate for Discharge 05/08/2023 1124 by Jarelis Ehlert N, RN Outcome: Progressing   Problem: Clinical Measurements: Goal: Ability to maintain clinical measurements within normal limits will improve 05/08/2023 1302 by Donicia Druck N, RN Outcome: Adequate for Discharge 05/08/2023 1302 by Jamont Mellin N, RN Outcome: Adequate for Discharge 05/08/2023 1124 by Terrick Allred N, RN Outcome: Progressing Goal: Will remain free from infection 05/08/2023 1302 by Maya Arcand N, RN Outcome: Adequate for Discharge 05/08/2023 1302 by Cristal Howatt N, RN Outcome: Adequate for Discharge 05/08/2023 1124 by Navah Grondin N, RN Outcome: Progressing Goal: Diagnostic test results will improve 05/08/2023 1302 by Annjanette Wertenberger N, RN Outcome: Adequate for Discharge 05/08/2023 1302 by Amed Datta N, RN Outcome: Adequate for Discharge 05/08/2023 1124 by Albino Bufford N, RN Outcome: Progressing Goal: Respiratory complications will improve 05/08/2023 1302 by Duff Pozzi N, RN Outcome: Adequate for Discharge 05/08/2023 1302 by Yomara Toothman N, RN Outcome: Adequate for Discharge 05/08/2023 1124 by Kashon Kraynak N, RN Outcome: Progressing Goal: Cardiovascular complication will be avoided 05/08/2023 1302 by Jeris Roser N, RN Outcome: Adequate for  Discharge 05/08/2023 1302 by Abbee Cremeens N, RN Outcome: Adequate for Discharge 05/08/2023 1124 by Tarun Patchell N, RN Outcome: Progressing   Problem: Activity: Goal: Risk for activity intolerance will decrease 05/08/2023 1302 by Minnetta Sandora N, RN Outcome: Adequate for Discharge 05/08/2023 1302 by Azalee Weimer N, RN Outcome: Adequate for Discharge 05/08/2023 1124 by Phynix Horton N, RN Outcome: Progressing   Problem: Nutrition: Goal: Adequate nutrition will be maintained 05/08/2023 1302 by Vasilia Dise N, RN Outcome: Adequate for Discharge 05/08/2023 1302 by Zaara Sprowl N, RN Outcome: Adequate for Discharge 05/08/2023 1124 by Emmett Arntz N, RN Outcome: Progressing   Problem: Coping: Goal: Level of anxiety will decrease 05/08/2023 1302 by Raekwan Spelman N, RN Outcome: Adequate for Discharge 05/08/2023 1302 by Eyonna Sandstrom N, RN Outcome: Adequate for Discharge 05/08/2023 1124 by Nicolaus Andel N, RN Outcome: Progressing   Problem: Elimination: Goal: Will not experience complications related to bowel motility 05/08/2023 1302 by Fred Franzen N, RN Outcome: Adequate for Discharge 05/08/2023 1302 by Johnthomas Lader N, RN Outcome: Adequate for Discharge 05/08/2023 1124 by Latriece Anstine N, RN Outcome: Progressing Goal: Will not experience complications related to urinary retention 05/08/2023 1302 by Bobbijo Holst N, RN Outcome: Adequate for Discharge 05/08/2023 1302 by Jamin Humphries N, RN Outcome: Adequate for Discharge 05/08/2023 1124 by Raenah Murley N, RN Outcome: Progressing   Problem: Pain Managment: Goal: General experience of comfort will improve 05/08/2023 1302 by Envi Eagleson N, RN Outcome: Adequate for Discharge 05/08/2023 1302 by Ambreen Tufte N, RN Outcome: Adequate for Discharge 05/08/2023 1124 by Grier Vu N, RN Outcome: Progressing   Problem: Safety: Goal: Ability to remain free from injury will  improve 05/08/2023 1302 by Sawyer Kahan N, RN Outcome: Adequate for Discharge 05/08/2023 1302 by Daionna Crossland N, RN Outcome: Adequate for Discharge 05/08/2023 1124 by Cambri Plourde N, RN Outcome: Progressing   Problem: Skin Integrity: Goal: Risk for impaired skin integrity will   decrease 05/08/2023 1302 by Berneta Sconyers N, RN Outcome: Adequate for Discharge 05/08/2023 1302 by Lorijean Husser N, RN Outcome: Adequate for Discharge 05/08/2023 1124 by Jontae Sonier N, RN Outcome: Progressing   

## 2023-05-08 NOTE — Discharge Summary (Signed)
Name: [name] MRN: [id number] DOB: 1956-07-11 67 y.o. PCP: Pcp, No  Date of Admission: 05/05/2023  4:20 PM Date of Discharge: 05/08/2023 Attending Physician: Reymundo Poll, MD  Discharge Diagnosis: 1. Principal Problem:   Acute upper GI bleed Active Problems:   Diabetes mellitus (HCC)   Frailty syndrome in geriatric patient   Physical deconditioning   Malnutrition of moderate degree   Decubitus ulcer of sacral region, unstageable [facility])   Secondary hypertension   Discharge Medications: Allergies as of 05/08/2023       Reactions   Lipitor [atorvastatin Calcium] Other (See Comments)   Myalgias         Medication List     STOP taking these medications    docusate sodium 100 MG capsule Commonly known as: COLACE       TAKE these medications    acetaminophen 325 MG tablet Commonly known as: TYLENOL Take 650 mg by mouth every 6 (six) hours as needed for moderate pain, fever or headache.   gabapentin 800 MG tablet Commonly known as: NEURONTIN Take 800 mg by mouth 3 (three) times daily.   leptospermum manuka honey Pste paste Apply 1 Application topically daily. Start taking on: May 09, 2023   losartan 25 MG tablet Commonly known as: COZAAR Take 25 mg by mouth daily.   methocarbamol 500 MG tablet Commonly known as: ROBAXIN Take 2 tablets (1,000 mg total) by mouth 3 (three) times daily.   mirtazapine 15 MG disintegrating tablet Commonly known as: REMERON SOL-TAB Take 1 tablet (15 mg total) by mouth at bedtime.   oxyCODONE 5 MG immediate release tablet Commonly known as: Oxy IR/ROXICODONE Take 1 tablet (5 mg total) by mouth every 6 (six) hours as needed for up to 5 days for severe pain. Start taking on: May 09, 2023   pantoprazole 40 MG tablet Commonly known as: PROTONIX Take 1 tablet (40 mg total) by mouth 2 (two) times daily before a meal.   polyethylene glycol powder 17 GM/SCOOP powder Commonly known as: GLYCOLAX/MIRALAX Take 17 g by  mouth daily.   senna-docusate 8.6-50 MG tablet Commonly known as: Senokot-S Take 1 tablet by mouth at bedtime.   sucralfate 1 GM/10ML suspension Commonly known as: CARAFATE Take 10 mLs (1 g total) by mouth 4 (four) times daily -  with meals and at bedtime for 10 days.               Discharge Care Instructions  (From admission, onward)           Start     Ordered   05/08/23 0000  Discharge wound care:       Comments: Per wound care and nursing   05/08/23 1114            Disposition and follow-up:   Mr.Boden B Kosar was discharged from [facility] in Stable condition.  At the hospital follow up visit please address:  1.   Gastric ulcers -needs repeat EGD in 12 weeks -protonix  40mg  BID x 12 weeks(started 5/10) -sucralfate for another 1-2 weeks  Sacral pressure wounds -recommend air mattress -medihoney -general wound care  2.  Labs / imaging needed at time of follow-up: NA  3.  Pending labs/ test needing follow-up: NA  Follow-up Appointments:  Contact information for after-discharge care     Destination     [facility] Preferred SNF .   Service: Skilled Paramedic information: [address] [place] [address] [phone]  Follow up with your primary care doctor within 1-2 weeks of discharge. Follow up with your GI doctor in 12 weeks for repeat upper endoscopy.  Hospital Course by problem list: #GI bleed, melena  Acute on chronic anemia  [name] is a 67 year old male with history of peptic ulcer disease, recent hospitalization for GI bleed (5/13) who presents to ED on 05/14 with recurrent bloody bowel movements concerning for ongoing upper GI bleed. Patient was found sitting in urine and dark, bloody stool by home health nurse prior to hospitalization. Of note, patient had a few normal bowel movements prior to having dark stools again. No bright red blood noted.  He did have some mild upper abdominal pain but no pain with eating. Has an appetite. No dizziness, lightheadedness. Hgb was 9.0 on admission up from 8.2 on recent discharge. GI was consulted on admission and did not perform repeat EGD (last performed during recent hospitalization, demonstrating non-bleeding gastric/duodenal ulcers). Given stable vitals, labs, and minor discoloration of stool, patient at low-risk for ongoing large-volume bleeding from gastric/duodenal ulcers. Most likely etiology of patient's dark stools is old blood from prior bleeds. Patient may have also restarted home eliquis, which should have been discontinued (for L femur nailing procedure), and this may have contributed to possible small-volume bleeds. Prior to discharge, patient was tolerating a soft diet and had normal, non-bloody bowel movements. Hgb at discharge 9.8. Patient advised to continue daily PPI therapy (pantoprazole 40 mg BID) for 12 weeks, liquid sucralfate for 1-2 weeks, and follow-up surveillance EGD in 12 weeks.    #Muscle spasm  Leg, back pain Patient has a history of muscle spasms, back/leg pain in the setting of know degenerative lumbar disease and s/p L leg fracture 1 month ago. CTAP on admission concerning for ankylosing spondylitis, which may also be contributing to patient's pain. His mobility is limited due to injury and he is no longer able to transfer out of a wheel chair. Evaluated by PT who advised ongoing therapy to improve mobility of lower extremities. Muscle spasms, pain well-controlled with home gabapentin (800 mg TID) and home robaxin (1 g TID) and oxycodone (5 mg q6h PRN) during admission. Will be discharged to SNF given limited support at home and immobility.    #Sacral pressure ulcers Patient presents with multiple sacral pressure ulcers. Of note, patient has been bedridden since 04/10 and wounds worsen while sitting in urine, stool. At home, his support is very limited. During hospitalization,  patient's ulcers were redressed and wounds appear stable, non-infected. He is amenable to going to SNF upon discharge. Of note, he was evaluated by wound care team during prior admission -- recommendations for ongoing care were provided in their note.     #Malnutrition Patient has a history of malnutrition, poor PO intake. BMI 22.52 during hospitalization, which is an improvement. Patient on low-dose mirtazapine 50 mg nightly with good appetite prior to discharge. Advised to continue mirtazapine 50 mg nightly.   #Type 2 diabetes Patient presents with history of type 2 diabetes. Last A1c 4.7. Glucose 129 on admission. He is not on medication. His insulin resistance may have improved with weight loss.  # Hypertension Patient presents with a history of hypertension. Blood pressure was within normal limits during this admission while holding home medications. Can continue home regimen on discharge.   Discharge Exam:   BP 109/75 (BP Location: Right Arm)   Pulse 79   Temp 98.1 F (36.7 C) (Oral)   Resp 16   Ht 5'  10" (1.778 m)   Wt 71.2 kg   SpO2 100%   BMI 22.52 kg/m  Discharge exam:   General: Chronically ill-appearing male sitting in hospital bed, in no acute distress   Lungs:  Normal respiratory effort on RA. Clear to auscultation BL without crackles or wheezes.  Heart: RRR. S1 and S2 normal without gallop, murmur, or rubs.  Abdomen:  Soft, non-distended. Normal bowel sounds.    Pertinent Labs, Studies, and Procedures:     Latest Ref Rng & Units 05/08/2023    7:15 AM 05/07/2023   12:47 AM 05/06/2023    6:14 AM  CBC  WBC 4.0 - 10.5 K/uL 9.1  9.8  11.3   Hemoglobin 13.0 - 17.0 g/dL 9.2  9.8  9.0   Hematocrit 39.0 - 52.0 % 29.2  30.4  29.2   Platelets 150 - 400 K/uL 325  320  321        Latest Ref Rng & Units 05/06/2023   11:19 AM 05/05/2023    5:51 PM 05/05/2023    5:20 PM  BMP  Glucose 70 - 99 mg/dL 098  119  147   BUN 8 - 23 mg/dL 22  39  28   Creatinine 0.61 - 1.24 mg/dL  8.29  5.62  1.30   Sodium 135 - 145 mmol/L 138  141    140  138   Potassium 3.5 - 5.1 mmol/L 3.6  4.4    4.4  4.2   Chloride 98 - 111 mmol/L 106  106  105   CO2 22 - 32 mmol/L 25   24   Calcium 8.9 - 10.3 mg/dL 8.4   8.8    CT ABDOMEN PELVIS W CONTRAST  Result Date: 05/05/2023 CLINICAL DATA:  Generalized abdominal pain. EXAM: CT ABDOMEN AND PELVIS WITH CONTRAST TECHNIQUE: Multidetector CT imaging of the abdomen and pelvis was performed using the standard protocol following bolus administration of intravenous contrast. RADIATION DOSE REDUCTION: This exam was performed according to the departmental dose-optimization program which includes automated exposure control, adjustment of the mA and/or kV according to patient size and/or use of iterative reconstruction technique. CONTRAST:  75mL OMNIPAQUE IOHEXOL 350 MG/ML SOLN COMPARISON:  CT chest, abdomen and pelvis with IV contrast 09/11/2020. FINDINGS: Technical note: Pelvic imaging is limited due to extensive beam hardening and streak artifacts related patient's overlying arms, and due to left pelvic and left femoral shaft fracture fixation hardware. In addition, the study was performed with patient positioned right posterior oblique. Lower chest: Lung bases again show emphysematous changes and multifocal cystic changes in the right-greater-than-left lower lobes, base of the right middle lobe. Mild chronic elevation right hemidiaphragm. Asymmetric right lower lobe atelectasis related to positioning. The cardiac size is normal. There is a small chronic pericardial effusion anteriorly and chronic three-vessel coronary artery calcific plaques. Hepatobiliary: There are stable scattered subcentimeter hepatic cysts. There is no mass enhancement of the liver. The gallbladder and bile ducts are unremarkable. Pancreas: Partially atrophic.  Otherwise unremarkable. Spleen: No abnormality. Adrenals/Urinary Tract: There is mild adrenal hyperplasia. There is no adrenal mass.  There is a stable 1.8 cm homogeneous thin walled cyst in the superior pole right kidney. There are additional few scattered stable less than 5 mm hypodensities in the bilateral renal cortex which are too small to characterize but most likely cysts. No follow-up imaging is recommended. For reference see JACR 2018 Feb; 264-273, Management of the Incidental Renal Mass on CT, [employer] 2021; 814-848, Bosniak Classification of Cystic Renal  Masses, Version 2019. There is no renal mass enhancement and no urinary stone or obstruction. The bladder is not well seen due to pelvic spray and streak artifacts but it is normal in thickness where visible. Stomach/Bowel: There are moderate diffuse thickened folds in the stomach. The small bowel is normal caliber. A 3.2 cm diverticulum is again noted of the third portion of the duodenum just below the neck and body junction of the pancreas. The appendix was previously present and normal. I do not see it on today's scan probably due to extensive pelvic imaging artifacts obscuring it. There is no obvious right lower quadrant inflammatory process. There is moderate to severe fecal stasis. There colonic diverticulosis greatest in the sigmoid without obvious findings of acute diverticulitis although a proximal sigmoid diverticulitis would be obscured due to metal artifacts. Vascular/Lymphatic: There are heavy aortoiliac calcific plaques. IVC filter chronically noted. No AAA is seen. There is less significant visceral branch vessel atherosclerosis. No lymphadenopathy is seen. Reproductive: Enlarged prostate 4.9 cm transverse axis with dystrophic calcifications posteriorly. Size similar to 2021. Other: There is no free air, free hemorrhage or free fluid. Multiple pelvic phleboliths. There are no incarcerated hernias. Musculoskeletal: Left pelvic fracture fixation plating is again noted with new demonstration of a partially visible left femoral intramedullary fracture fixation rod. There  are bridging syndesmophytes and bridging enthesopathy of the thoracolumbar spine throughout. There is osteopenia with a spectrum of degenerative changes of the left-greater-than-right hips. No acute or other significant osseous findings There are bilaterally ankylosed SI joints and lumbar facet hypertrophy. IMPRESSION: 1. No acute inflammatory process is seen in the abdomen or pelvis. Please note however, assessment for sigmoid or other pelvic inflammatory changes is very limited on this study. 2. Diverticulosis with moderate to severe fecal stasis. No bowel obstruction. 3. Gastroenteritis. 4. Prostatomegaly. 5. Aortic and coronary artery atherosclerosis. 6. Left pelvic chronic plating with new demonstration of a partially visible left femoral intramedullary rod. 7. Bridging syndesmophytes and bridging enthesopathy of the thoracolumbar spine, with ankylosed SI joints. Findings could be due to ankylosing spondylitis or other seronegative spondyloarthropathy. 8. Emphysema and cystic changes in the lung bases. Aortic Atherosclerosis (ICD10-I70.0) and Emphysema (ICD10-J43.9). Electronically Signed   By: Almira Bar M.D.   On: 05/05/2023 22:17   DG Chest Port 1 View  Result Date: 05/05/2023 CLINICAL DATA:  Sepsis EXAM: PORTABLE CHEST 1 VIEW COMPARISON:  X-ray 03/26/2023 FINDINGS: Hyperinflation. No consolidation, pneumothorax or effusion. No edema. Normal cardiopericardial silhouette. Overlapping cardiac leads. Fixation hardware along the lower cervical spine at the edge of the imaging field. IMPRESSION: Hyperinflation.  No acute cardiopulmonary disease. Electronically Signed   By: Karen Kays M.D.   On: 05/05/2023 17:07     Discharge Instructions: Discharge Instructions     Diet - low sodium heart healthy   Complete by: As directed    Discharge wound care:   Complete by: As directed    Per wound care and nursing   Increase activity slowly   Complete by: As directed       You were hospitalized for  dark stools. There was no evidence of active bleeding and the GI team decided not to repeat endoscopy. Your blood counts remained stable. You were evaluated by PT/OT and recommended for SNF for acute rehab. You were discharged to SNF.At home please:   Take protonix 40mg  1 tablet twice daily for 12 weeks Take sucralfate four times daily with meals for 1-2 weeks Follow up with the internal medicine center  on 05/11/2023 at 3:45 PM   It was a pleasure caring for you! Signed: Willette Cluster, MD 05/08/2023, 11:15 AM   Pager: Willette Cluster, MD Internal Medicine Resident, PGY-1 [facility]  Pager: [phone]

## 2023-05-08 NOTE — Discharge Instructions (Signed)
You were hospitalized for dark stools. There was no evidence of active bleeding and the GI team decided not to repeat endoscopy. Your blood counts remained stable. You were evaluated by PT/OT and recommended for SNF for acute rehab. You were discharged to SNF.At home please:   Take protonix 40mg  1 tablet twice daily for 12 weeks Take sucralfate four times daily with meals for 1-2 weeks Follow up with the internal medicine center on 05/11/2023 at 3:45 PM   It was a pleasure caring for you!

## 2023-05-08 NOTE — Progress Notes (Signed)
Pt. discharged to [facility] at this time. PIV removed on 05/07/23 with catheter tip intact. All belongings sent with pt. At time of discharge. Report called to [place], [place].

## 2023-05-08 NOTE — Plan of Care (Signed)

## 2023-05-08 NOTE — TOC Transition Note (Addendum)
Transition of Care [facility]) - CM/SW Discharge Note   Patient Details  Name: [name] MRN: [id number] Date of Birth: Apr 15, 1956  [facility]) CM/SW Contact:  Deatra Robinson, [place] Phone Number: 05/08/2023, 11:34 AM   Clinical Narrative:  Met with pt at bedside re SNF choice. Pt reports his brother toured [employer] [employer] and would like to accept this facility. [employer]i/[employer] auth received (5/16 - 5/20 navi Health ID [id number] Plan Auth ID [id number]) and confirmed with Whitney at [facility] they are prepared to admit today. RN provided with number for report and PTAR arranged for transport. Pt aware of dc and reports agreeable. SW signing off at dc.   Dellie Burns, MSW, LCSW [phone] (coverage)      Final next level of care: Skilled Nursing Facility Barriers to Discharge: No Barriers Identified   Patient Goals and CMS Choice CMS Medicare.gov Compare Post Acute Care list provided to:: Patient Choice offered to / list presented to : Patient  Discharge Placement                Patient chooses bed at: Other - please specify in the comment section below: ([facility]) Patient to be transferred to facility by: PTAR Name of family member notified: Pt to update brother Patient and family notified of of transfer: 05/08/23  Discharge Plan and Services Additional resources added to the After Visit Summary for       Post Acute Care Choice: Skilled Nursing Facility                               Social Determinants of Health (SDOH) Interventions SDOH Screenings   Food Insecurity: No Food Insecurity (05/01/2023)  Housing: Low Risk  (05/01/2023)  Transportation Needs: No Transportation Needs (05/01/2023)  Utilities: Not At Risk (05/01/2023)  Tobacco Use: High Risk (05/04/2023)     Readmission Risk Interventions    05/04/2023    1:50 PM 09/17/2020    3:07 PM  Readmission Risk Prevention Plan  Post Dischage Appt  Not Complete  Appt Comments   SNF placement  Medication Screening  Complete  Transportation Screening Complete Complete  PCP or Specialist Appt within 5-7 Days Complete   Home Care Screening Complete   Medication Review (RN CM) Referral to Pharmacy

## 2023-05-08 NOTE — Plan of Care (Signed)
Problem: Education: Goal: Knowledge of General Education information will improve Description: Including pain rating scale, medication(s)/side effects and non-pharmacologic comfort measures 05/08/2023 1302 by Weyman Pedro, RN Outcome: Adequate for Discharge 05/08/2023 1302 by Weyman Pedro, RN Outcome: Adequate for Discharge 05/08/2023 1124 by Weyman Pedro, RN Outcome: Progressing   Problem: Health Behavior/Discharge Planning: Goal: Ability to manage health-related needs will improve 05/08/2023 1302 by Weyman Pedro, RN Outcome: Adequate for Discharge 05/08/2023 1302 by Weyman Pedro, RN Outcome: Adequate for Discharge 05/08/2023 1124 by Weyman Pedro, RN Outcome: Progressing   Problem: Clinical Measurements: Goal: Ability to maintain clinical measurements within normal limits will improve 05/08/2023 1302 by Weyman Pedro, RN Outcome: Adequate for Discharge 05/08/2023 1302 by Weyman Pedro, RN Outcome: Adequate for Discharge 05/08/2023 1124 by Weyman Pedro, RN Outcome: Progressing Goal: Will remain free from infection 05/08/2023 1302 by Weyman Pedro, RN Outcome: Adequate for Discharge 05/08/2023 1302 by Weyman Pedro, RN Outcome: Adequate for Discharge 05/08/2023 1124 by Weyman Pedro, RN Outcome: Progressing Goal: Diagnostic test results will improve 05/08/2023 1302 by Weyman Pedro, RN Outcome: Adequate for Discharge 05/08/2023 1302 by Weyman Pedro, RN Outcome: Adequate for Discharge 05/08/2023 1124 by Weyman Pedro, RN Outcome: Progressing Goal: Respiratory complications will improve 05/08/2023 1302 by Weyman Pedro, RN Outcome: Adequate for Discharge 05/08/2023 1302 by Weyman Pedro, RN Outcome: Adequate for Discharge 05/08/2023 1124 by Weyman Pedro, RN Outcome: Progressing Goal: Cardiovascular complication will be avoided 05/08/2023 1302 by Weyman Pedro, RN Outcome: Adequate for  Discharge 05/08/2023 1302 by Weyman Pedro, RN Outcome: Adequate for Discharge 05/08/2023 1124 by Weyman Pedro, RN Outcome: Progressing   Problem: Activity: Goal: Risk for activity intolerance will decrease 05/08/2023 1302 by Weyman Pedro, RN Outcome: Adequate for Discharge 05/08/2023 1302 by Weyman Pedro, RN Outcome: Adequate for Discharge 05/08/2023 1124 by Weyman Pedro, RN Outcome: Progressing   Problem: Nutrition: Goal: Adequate nutrition will be maintained 05/08/2023 1302 by Weyman Pedro, RN Outcome: Adequate for Discharge 05/08/2023 1302 by Weyman Pedro, RN Outcome: Adequate for Discharge 05/08/2023 1124 by Weyman Pedro, RN Outcome: Progressing   Problem: Coping: Goal: Level of anxiety will decrease 05/08/2023 1302 by Weyman Pedro, RN Outcome: Adequate for Discharge 05/08/2023 1302 by Weyman Pedro, RN Outcome: Adequate for Discharge 05/08/2023 1124 by Weyman Pedro, RN Outcome: Progressing   Problem: Elimination: Goal: Will not experience complications related to bowel motility 05/08/2023 1302 by Weyman Pedro, RN Outcome: Adequate for Discharge 05/08/2023 1302 by Weyman Pedro, RN Outcome: Adequate for Discharge 05/08/2023 1124 by Weyman Pedro, RN Outcome: Progressing Goal: Will not experience complications related to urinary retention 05/08/2023 1302 by Weyman Pedro, RN Outcome: Adequate for Discharge 05/08/2023 1302 by Weyman Pedro, RN Outcome: Adequate for Discharge 05/08/2023 1124 by Weyman Pedro, RN Outcome: Progressing   Problem: Pain Managment: Goal: General experience of comfort will improve 05/08/2023 1302 by Weyman Pedro, RN Outcome: Adequate for Discharge 05/08/2023 1302 by Weyman Pedro, RN Outcome: Adequate for Discharge 05/08/2023 1124 by Weyman Pedro, RN Outcome: Progressing   Problem: Safety: Goal: Ability to remain free from injury will  improve 05/08/2023 1302 by Weyman Pedro, RN Outcome: Adequate for Discharge 05/08/2023 1302 by Weyman Pedro, RN Outcome: Adequate for Discharge 05/08/2023 1124 by Weyman Pedro, RN Outcome: Progressing   Problem: Skin Integrity: Goal: Risk for impaired skin integrity will  decrease 05/08/2023 1302 by Weyman Pedro, RN Outcome: Adequate for Discharge 05/08/2023 1302 by Weyman Pedro, RN Outcome: Adequate for Discharge 05/08/2023 1124 by Weyman Pedro, RN Outcome: Progressing

## 2023-05-09 ENCOUNTER — Emergency Department (HOSPITAL_COMMUNITY)
Admission: EM | Admit: 2023-05-09 | Discharge: 2023-05-10 | Disposition: A | Discharge status: Home / Self Care | Payer: 59 | Attending: Emergency Medicine | Admitting: Emergency Medicine

## 2023-05-09 ENCOUNTER — Encounter (HOSPITAL_COMMUNITY): Payer: Self-pay | Admitting: Emergency Medicine

## 2023-05-09 ENCOUNTER — Other Ambulatory Visit: Payer: Self-pay

## 2023-05-09 DIAGNOSIS — E119 Type 2 diabetes mellitus without complications: Secondary | ICD-10-CM | POA: Diagnosis not present

## 2023-05-09 DIAGNOSIS — K279 Peptic ulcer, site unspecified, unspecified as acute or chronic, without hemorrhage or perforation: Secondary | ICD-10-CM | POA: Diagnosis not present

## 2023-05-09 DIAGNOSIS — M549 Dorsalgia, unspecified: Secondary | ICD-10-CM | POA: Diagnosis present

## 2023-05-09 DIAGNOSIS — I10 Essential (primary) hypertension: Secondary | ICD-10-CM | POA: Insufficient documentation

## 2023-05-09 LAB — CBC WITH DIFFERENTIAL/PLATELET
Abs Immature Granulocytes: 0.03 10*3/uL (ref 0.00–0.07)
Basophils Absolute: 0.1 10*3/uL (ref 0.0–0.1)
Basophils Relative: 1 %
Eosinophils Absolute: 0.2 10*3/uL (ref 0.0–0.5)
Eosinophils Relative: 1 %
HCT: 29.6 % — ABNORMAL LOW (ref 39.0–52.0)
Hemoglobin: 9.2 g/dL — ABNORMAL LOW (ref 13.0–17.0)
Immature Granulocytes: 0 %
Lymphocytes Relative: 15 %
Lymphs Abs: 1.7 10*3/uL (ref 0.7–4.0)
MCH: 30.9 pg (ref 26.0–34.0)
MCHC: 31.1 g/dL (ref 30.0–36.0)
MCV: 99.3 fL (ref 80.0–100.0)
Monocytes Absolute: 0.8 10*3/uL (ref 0.1–1.0)
Monocytes Relative: 7 %
Neutro Abs: 8.7 10*3/uL — ABNORMAL HIGH (ref 1.7–7.7)
Neutrophils Relative %: 76 %
Platelets: 377 10*3/uL (ref 150–400)
RBC: 2.98 MIL/uL — ABNORMAL LOW (ref 4.22–5.81)
RDW: 15.2 % (ref 11.5–15.5)
WBC: 11.4 10*3/uL — ABNORMAL HIGH (ref 4.0–10.5)
nRBC: 0 % (ref 0.0–0.2)

## 2023-05-09 LAB — COMPREHENSIVE METABOLIC PANEL
ALT: 14 U/L (ref 0–44)
AST: 15 U/L (ref 15–41)
Albumin: 2.5 g/dL — ABNORMAL LOW (ref 3.5–5.0)
Alkaline Phosphatase: 89 U/L (ref 38–126)
Anion gap: 6 (ref 5–15)
BUN: 19 mg/dL (ref 8–23)
CO2: 25 mmol/L (ref 22–32)
Calcium: 8.2 mg/dL — ABNORMAL LOW (ref 8.9–10.3)
Chloride: 107 mmol/L (ref 98–111)
Creatinine, Ser: 0.84 mg/dL (ref 0.61–1.24)
GFR, Estimated: >60 mL/min (ref >60)
Glucose, Bld: 148 mg/dL — ABNORMAL HIGH (ref 70–99)
Potassium: 3.9 mmol/L (ref 3.5–5.1)
Sodium: 138 mmol/L (ref 135–145)
Total Bilirubin: 0.7 mg/dL (ref 0.3–1.2)
Total Protein: 6 g/dL — ABNORMAL LOW (ref 6.5–8.1)

## 2023-05-09 MED ORDER — MIRTAZAPINE 15 MG PO TBDP
15.0000 mg | ORAL_TABLET | Freq: Every day | ORAL | Status: DC
  Administered 2023-05-09: 15 mg via ORAL
  Filled 2023-05-09: qty 1

## 2023-05-09 MED ORDER — LOSARTAN POTASSIUM 25 MG PO TABS
25.0000 mg | ORAL_TABLET | Freq: Every day | ORAL | Status: DC
  Administered 2023-05-09 – 2023-05-10 (×2): 25 mg via ORAL
  Filled 2023-05-09 (×2): qty 1

## 2023-05-09 MED ORDER — GABAPENTIN 400 MG PO CAPS
800.0000 mg | ORAL_CAPSULE | Freq: Three times a day (TID) | ORAL | Status: DC
  Administered 2023-05-09 – 2023-05-10 (×3): 800 mg via ORAL
  Filled 2023-05-09 (×3): qty 2

## 2023-05-09 MED ORDER — SUCRALFATE 1 G PO TABS
1.0000 g | ORAL_TABLET | Freq: Three times a day (TID) | ORAL | Status: DC
  Administered 2023-05-09 – 2023-05-10 (×3): 1 g via ORAL
  Filled 2023-05-09 (×3): qty 1

## 2023-05-09 MED ORDER — OXYCODONE HCL 5 MG PO TABS
5.0000 mg | ORAL_TABLET | Freq: Four times a day (QID) | ORAL | Status: DC | PRN
  Administered 2023-05-09 – 2023-05-10 (×4): 5 mg via ORAL
  Filled 2023-05-09 (×4): qty 1

## 2023-05-09 MED ORDER — PANTOPRAZOLE SODIUM 40 MG PO TBEC
40.0000 mg | DELAYED_RELEASE_TABLET | Freq: Two times a day (BID) | ORAL | Status: DC
  Administered 2023-05-09 – 2023-05-10 (×2): 40 mg via ORAL
  Filled 2023-05-09 (×3): qty 1

## 2023-05-09 MED ORDER — METHOCARBAMOL 500 MG PO TABS
1000.0000 mg | ORAL_TABLET | Freq: Three times a day (TID) | ORAL | Status: DC
  Administered 2023-05-09 – 2023-05-10 (×2): 1000 mg via ORAL
  Filled 2023-05-09 (×2): qty 2

## 2023-05-09 MED ORDER — POLYETHYLENE GLYCOL 3350 17 G PO PACK
17.0000 g | PACK | Freq: Every day | ORAL | Status: DC
  Administered 2023-05-09 – 2023-05-10 (×2): 17 g via ORAL
  Filled 2023-05-09 (×2): qty 1

## 2023-05-09 NOTE — ED Triage Notes (Addendum)
Patient presents for pain management from [facility]. He complains of leg and back pain, and has not had pain meds since yesterday. Patient would also like to be placed in a different facility.     EMS vitals: 125/75 BP 89 HR 100% SPO2 on room air 123 CBG 20 RR

## 2023-05-09 NOTE — ED Provider Notes (Addendum)
[place] EMERGENCY DEPARTMENT AT [facility] Provider Note   CSN: [id number] Arrival date & time: 05/09/23  1553     History  Chief Complaint  Patient presents with   Back Pain   Leg Pain    [name] is a 67 y.o. male.  Patient just discharged from [facility] yesterday where he was on the internal medicine service.  He was admitted May 14 discharged May 17 for acute GI bleed.  Also has hypertension and diabetes.  Patient was discharged to [facility] nursing facility he claims they would not give him any of his meds.  Patient clearly was discharged on several meds to include Carafate and Protonix as well as pain medicine.  Patient called EMS and brought in here and does not want to go back to that nursing facility.  Other past medical history significant for decubitus ulcer sacral secondary hypertension physical deconditioning fairly syndrome for any geriatric patient.  As well as the diabetes.  Patient states that he has not had any further vomiting of blood or seeing any blood in his bowel movements.  Which is reassuring.  During the hospitalization he had an upper endoscopy that was done May 10.  Patient is known to have a history of peptic ulcer disease.  Presented on May 14 with recurrent bloody bowel movements concerning for ongoing upper GI bleed.  Patient came from a nursing facility.  As hemoglobin on admission was 9 was actually up from 8.2 on her recent discharge.  GI was consulted consulted on admission and did not perform repeat EGD D.  Last performed during recent hospitalization.  Demonstrating nonbleeding gastric and duodenal ulcers.       Home Medications Prior to Admission medications   Medication Sig Start Date End Date Taking? Authorizing Provider  acetaminophen (TYLENOL) 325 MG tablet Take 650 mg by mouth every 6 (six) hours as needed for moderate pain, fever or headache.    [provider]  gabapentin (NEURONTIN) 800 MG tablet  Take 800 mg by mouth 3 (three) times daily.    [provider]  leptospermum manuka honey (MEDIHONEY) PSTE paste Apply 1 Application topically daily. 05/09/23   Willette Cluster, MD  losartan (COZAAR) 25 MG tablet Take 25 mg by mouth daily.    [provider]  methocarbamol (ROBAXIN) 500 MG tablet Take 2 tablets (1,000 mg total) by mouth 3 (three) times daily. 05/04/23 06/03/23  Willette Cluster, MD  mirtazapine (REMERON SOL-TAB) 15 MG disintegrating tablet Take 1 tablet (15 mg total) by mouth at bedtime. 04/01/23 03/31/24  Morene Crocker, MD  oxyCODONE (OXY IR/ROXICODONE) 5 MG immediate release tablet Take 1 tablet (5 mg total) by mouth every 6 (six) hours as needed for up to 5 days for severe pain. 05/09/23 05/14/23  Willette Cluster, MD  pantoprazole (PROTONIX) 40 MG tablet Take 1 tablet (40 mg total) by mouth 2 (two) times daily before a meal. 05/04/23 06/03/23  Willette Cluster, MD  polyethylene glycol powder (GLYCOLAX/MIRALAX) 17 GM/SCOOP powder Take 17 g by mouth daily.    [provider]  senna-docusate (SENOKOT-S) 8.6-50 MG tablet Take 1 tablet by mouth at bedtime. 05/08/23   Willette Cluster, MD  sucralfate (CARAFATE) 1 GM/10ML suspension Take 10 mLs (1 g total) by mouth 4 (four) times daily -  with meals and at bedtime for 10 days. 05/08/23 05/18/23  Willette Cluster, MD      Allergies    Lipitor [atorvastatin calcium]    Review of Systems  Review of Systems  Constitutional:  Negative for chills and fever.  HENT:  Negative for ear pain and sore throat.   Eyes:  Negative for pain and visual disturbance.  Respiratory:  Negative for cough and shortness of breath.   Cardiovascular:  Negative for chest pain and palpitations.  Gastrointestinal:  Negative for abdominal pain, blood in stool, nausea and vomiting.  Genitourinary:  Negative for dysuria and hematuria.  Musculoskeletal:  Negative for arthralgias and back pain.  Skin:  Negative for color change and rash.  Neurological:   Negative for seizures and syncope.  All other systems reviewed and are negative.   Physical Exam Updated Vital Signs BP (!) 121/57   Pulse 89   Temp 98.3 F (36.8 C)   Resp 18   Ht 1.778 m (5\' 10" )   Wt 70.8 kg   SpO2 100%   BMI 22.38 kg/m  Physical Exam Vitals and nursing note reviewed.  Constitutional:      General: He is not in acute distress.    Appearance: Normal appearance. He is well-developed.  HENT:     Head: Normocephalic and atraumatic.  Eyes:     Extraocular Movements: Extraocular movements intact.     Conjunctiva/sclera: Conjunctivae normal.     Pupils: Pupils are equal, round, and reactive to light.  Cardiovascular:     Rate and Rhythm: Normal rate and regular rhythm.     Heart sounds: No murmur heard. Pulmonary:     Effort: Pulmonary effort is normal. No respiratory distress.     Breath sounds: Normal breath sounds.  Abdominal:     Palpations: Abdomen is soft.     Tenderness: There is no abdominal tenderness. There is no guarding.  Musculoskeletal:        General: No swelling.     Cervical back: Neck supple.  Skin:    General: Skin is warm and dry.     Capillary Refill: Capillary refill takes less than 2 seconds.  Neurological:     Mental Status: He is alert and oriented to person, place, and time. Mental status is at baseline.  Psychiatric:        Mood and Affect: Mood normal.     ED Results / Procedures / Treatments   Labs (all labs ordered are listed, but only abnormal results are displayed) Labs Reviewed  CBC WITH DIFFERENTIAL/PLATELET  COMPREHENSIVE METABOLIC PANEL    EKG None  Radiology No results found.  Procedures Procedures    Medications Ordered in ED Medications  gabapentin (NEURONTIN) capsule 800 mg (800 mg Oral Given 05/09/23 1645)  losartan (COZAAR) tablet 25 mg (25 mg Oral Given 05/09/23 1645)  mirtazapine (REMERON SOL-TAB) disintegrating tablet 15 mg (has no administration in time range)  oxyCODONE (Oxy  IR/ROXICODONE) immediate release tablet 5 mg (5 mg Oral Given 05/09/23 1645)  pantoprazole (PROTONIX) EC tablet 40 mg (has no administration in time range)  sucralfate (CARAFATE) tablet 1 g (1 g Oral Given 05/09/23 1645)  polyethylene glycol (MIRALAX / GLYCOLAX) packet 17 g (17 g Oral Given 05/09/23 1644)    ED Course/ Medical Decision Making/ A&P                             Medical Decision Making Amount and/or Complexity of Data Reviewed Labs: ordered.  Risk Prescription drug management.   Basic labs CBC and basic metabolic panel will be checked for baseline.  Have consulted social worker and case management.  Ordered his discharge medications for him which includes pain medicine includes Carafate and includes Protonix.  Patient without any new specific complaints here today.  Just that the nursing facility would not provide him with any of his medications.  I am sure he was mostly bothered by the fact he was not receiving pain medicine.  Social worker case management will be necessary to see about placement back to that facility to make sure though given his meds or placement to a new facility which is what the patient wants.  Patient was at a different facility prior to this recent discharge.  Will follow-up on his basic labs which are still pending.  Patient's CBC white count 11.4 hemoglobin 9.2.  This is completely unchanged from when he was in the hospital so that stable.  Platelets are also normal at 377.  Complete metabolic panel without significant electrolyte abnormalities.  Renal functions normal.  Liver function test normal.  As discussed above we will have case manager and social worker get involved for nursing home placement because patient refuses to go back to his nursing home he was discharged to.   Final Clinical Impression(s) / ED Diagnoses Final diagnoses:  Peptic ulcer disease    Rx / DC Orders ED Discharge Orders     None         Vanetta Mulders,  MD 05/09/23 1739    Vanetta Mulders, MD 05/09/23 1901

## 2023-05-10 DIAGNOSIS — K279 Peptic ulcer, site unspecified, unspecified as acute or chronic, without hemorrhage or perforation: Secondary | ICD-10-CM | POA: Diagnosis not present

## 2023-05-10 LAB — CULTURE, BLOOD (ROUTINE X 2): Culture: NO GROWTH

## 2023-05-10 MED ORDER — LORAZEPAM 0.5 MG PO TABS: 0.5000 mg | ORAL_TABLET | Freq: Three times a day (TID) | ORAL | 1 refills | Status: DC | PRN

## 2023-05-10 NOTE — Discharge Instructions (Signed)
Go back to your nh and you can start a process there to get in a new nursing home

## 2023-05-10 NOTE — ED Notes (Signed)
Called [facility] to transport patient to [facility]

## 2023-05-10 NOTE — ED Notes (Signed)
Called report to the DON at [facility]

## 2023-05-10 NOTE — ED Notes (Signed)
Brief changed, and pericare performed. Dressings to multiple wounds on buttocks, back and legs cleaned and changed. Bed linen changed. Pt repositioned to left side. Pt offers no complaints at this time.

## 2023-05-10 NOTE — Progress Notes (Signed)
CSW spoke with pt, he reported requesting his medications on Friday and never received them. He reports the facility stated they will not have his medications until Monday. Pt reports" They did not treat me right". Pt reports "They were lying to me". pt reports he cannot go home either, due to no one to care for him. CSW explained this was the pt's chosen facility and would have to work with [facility] to get a transfer if he would like a different facility. CSW informed pt that the MD will need to see if a new PT eval would benefit, due to needing new insurance auth.   9:30am CSW spoke with the DON Sana; she reports that the facility has all the pt's medications. She reports the facility offered pt's pain meds to him yesterday but he refused them. The facility reports the pt was requesting medications for his nerves like Ativan, which pt does not have a prescription for.   10:00am CSW discussed with MD, pt is to return to [facility] and work with them to transition to a new facility. CSW informed MD the facility is requesting 14-day prescriptions for Ativan for pt's nerve damage. No additional TOC needs, TOC sign off.    Valentina Shaggy.Cleo Santucci, MSW, LCSWA [facility]  Transitions of Care Clinical Social Worker I Direct Dial: [phone]  Fax: [phone] [email or web address]

## 2023-05-11 ENCOUNTER — Ambulatory Visit: Payer: 59

## 2023-05-11 NOTE — Progress Notes (Deleted)
CC: New patient visit/hospital f/u  HPI:  Mr.[name] is a 67 y.o. male with past medical history of HTN, T2DM, GI  bleed, paraplegia that presents for new patient visit/hospital f/u.    Allergies as of 05/11/2023       Reactions   Lipitor [atorvastatin Calcium] Other (See Comments)   Myalgias         Medication List        Accurate as of May 11, 2023  9:40 AM. If you have any questions, ask your nurse or doctor.          acetaminophen 325 MG tablet Commonly known as: TYLENOL Take 650 mg by mouth every 6 (six) hours as needed for moderate pain, fever or headache.   gabapentin 800 MG tablet Commonly known as: NEURONTIN Take 800 mg by mouth 3 (three) times daily.   LORazepam 0.5 MG tablet Commonly known as: Ativan Take 1 tablet (0.5 mg total) by mouth every 8 (eight) hours as needed (nerve provlem).   losartan 25 MG tablet Commonly known as: COZAAR Take 25 mg by mouth daily.   methocarbamol 500 MG tablet Commonly known as: ROBAXIN Take 2 tablets (1,000 mg total) by mouth 3 (three) times daily.   mirtazapine 15 MG disintegrating tablet Commonly known as: REMERON SOL-TAB Take 1 tablet (15 mg total) by mouth at bedtime.   oxyCODONE 5 MG immediate release tablet Commonly known as: Oxy IR/ROXICODONE Take 1 tablet (5 mg total) by mouth every 6 (six) hours as needed for up to 5 days for severe pain.   pantoprazole 40 MG tablet Commonly known as: PROTONIX Take 1 tablet (40 mg total) by mouth 2 (two) times daily before a meal.   senna-docusate 8.6-50 MG tablet Commonly known as: Senokot-S Take 1 tablet by mouth at bedtime.   sucralfate 1 GM/10ML suspension Commonly known as: CARAFATE Take 10 mLs (1 g total) by mouth 4 (four) times daily -  with meals and at bedtime for 10 days.         Past Medical History:  Diagnosis Date   Cellulitis 07/17/2020   LEFT HIP   Diabetes mellitus    Dyspnea    Hyperlipidemia    Hypertension    Review of  Systems:  per HPI.   Physical Exam: *** There were no vitals filed for this visit. *** Constitutional: Well-developed, well-nourished, appears comfortable  HENT: Normocephalic and atraumatic.  Eyes: EOM are normal. PERRL.  Neck: Normal range of motion.  Cardiovascular: Regular rate, regular rhythm. No murmurs, rubs, or gallops. Normal radial and PT pulses bilaterally. No LE edema.  Pulmonary: Normal respiratory effort. No wheezes, rales, or rhonchi.   Abdominal: Soft. Non-distended. No tenderness. Normal bowel sounds.  Musculoskeletal: Normal range of motion.     Neurological: Alert and oriented to person, place, and time. Non-focal. Skin: warm and dry.    Assessment & Plan:   HTN Patient has a history of HTN. Current medications include losartan 25 MG daily. Patient states that they are *** compliant with this medication. Patient states that they do *** check their BP regularly at home. Patient denies HA, lightheadedness, dizziness, CP, or SOB. Initial BP today is ***. Repeat BP is ***.    Plan: - Continue losartan 25 MG daily***  2. T2DM Patient has a history of T2DM. Currently diet controlled. Patient denies polyuria, polydipsia, fatigue. Patient states that they do *** visit the ophthalmologist for yearly eye exams. A1c was 5.7% 1 month ago. Foot  exam performed today***.   Plan: - Continue dietary modification*** - Repeat A1c in 2 months - Urine ACR today*** - F/u ophthalmology***   3. GI bleed/acute on chronic anemia Patient was discharged on 5/17 after being hospitalized for GI bleed. Had EGD that demonstrated non-bleeding gastric/duodenal ulcers. Plan at discharge was to continue w/ daily PPI therapy (pantoprazole 40 mg BID) for 12 weeks, liquid sucralfate for 1-2 weeks, and follow-up surveillance EGD in 12 weeks. Patient reports compliance with these medications***. Patient denies having melena or hematochezia since his hospitalization***.   Plan: - Continue pantoprazole  40 mg BID*** - Continue sucralfate*** - F/u w/ GI  4. Sacral ulcer Patient was noted to have multiple sacral pressure ulcers during his most recent hospitalization. Thought to be secondary to being bedridden. Wounds appeared stable and not infected. Plan was for continued wound care at SNF. Wounds today***.   Plan: - Continue wound care***   4. Health Screening: ***AWV, Hep C, tdap, pneumonia vaccine, colonsocopy, CT lung cancer screening -  - Medication refill? ***    See Encounters Tab for problem based charting.  No problem-specific Assessment & Plan notes found for this encounter.    Patient seen with Dr. Amadeo Garnet

## 2023-05-28 ENCOUNTER — Encounter (HOSPITAL_COMMUNITY): Payer: Self-pay

## 2023-05-28 ENCOUNTER — Emergency Department (HOSPITAL_COMMUNITY): Payer: 59

## 2023-05-28 ENCOUNTER — Other Ambulatory Visit: Payer: Self-pay

## 2023-05-28 ENCOUNTER — Emergency Department (HOSPITAL_COMMUNITY)
Admission: EM | Admit: 2023-05-28 | Discharge: 2023-05-28 | Disposition: A | Discharge status: Home / Self Care | Payer: 59 | Attending: Emergency Medicine | Admitting: Emergency Medicine

## 2023-05-28 DIAGNOSIS — I10 Essential (primary) hypertension: Secondary | ICD-10-CM | POA: Insufficient documentation

## 2023-05-28 DIAGNOSIS — Z79899 Other long term (current) drug therapy: Secondary | ICD-10-CM | POA: Insufficient documentation

## 2023-05-28 DIAGNOSIS — R1032 Left lower quadrant pain: Secondary | ICD-10-CM | POA: Insufficient documentation

## 2023-05-28 LAB — COMPREHENSIVE METABOLIC PANEL
ALT: 10 U/L (ref 0–44)
AST: 22 U/L (ref 15–41)
Albumin: 2.5 g/dL — ABNORMAL LOW (ref 3.5–5.0)
Alkaline Phosphatase: 100 U/L (ref 38–126)
Anion gap: 5 (ref 5–15)
BUN: 21 mg/dL (ref 8–23)
CO2: 26 mmol/L (ref 22–32)
Calcium: 8.4 mg/dL — ABNORMAL LOW (ref 8.9–10.3)
Chloride: 108 mmol/L (ref 98–111)
Creatinine, Ser: 0.87 mg/dL (ref 0.61–1.24)
GFR, Estimated: >60 mL/min (ref >60)
Glucose, Bld: 166 mg/dL — ABNORMAL HIGH (ref 70–99)
Potassium: 4.1 mmol/L (ref 3.5–5.1)
Sodium: 139 mmol/L (ref 135–145)
Total Bilirubin: 0.7 mg/dL (ref 0.3–1.2)
Total Protein: 6.1 g/dL — ABNORMAL LOW (ref 6.5–8.1)

## 2023-05-28 LAB — CBC WITH DIFFERENTIAL/PLATELET
Abs Immature Granulocytes: 0.02 10*3/uL (ref 0.00–0.07)
Basophils Absolute: 0.1 10*3/uL (ref 0.0–0.1)
Basophils Relative: 1 %
Eosinophils Absolute: 0.2 10*3/uL (ref 0.0–0.5)
Eosinophils Relative: 3 %
HCT: 34.7 % — ABNORMAL LOW (ref 39.0–52.0)
Hemoglobin: 10.6 g/dL — ABNORMAL LOW (ref 13.0–17.0)
Immature Granulocytes: 0 %
Lymphocytes Relative: 17 %
Lymphs Abs: 1.3 10*3/uL (ref 0.7–4.0)
MCH: 29.6 pg (ref 26.0–34.0)
MCHC: 30.5 g/dL (ref 30.0–36.0)
MCV: 96.9 fL (ref 80.0–100.0)
Monocytes Absolute: 0.5 10*3/uL (ref 0.1–1.0)
Monocytes Relative: 7 %
Neutro Abs: 5.3 10*3/uL (ref 1.7–7.7)
Neutrophils Relative %: 72 %
Platelets: 295 10*3/uL (ref 150–400)
RBC: 3.58 MIL/uL — ABNORMAL LOW (ref 4.22–5.81)
RDW: 14.6 % (ref 11.5–15.5)
WBC: 7.4 10*3/uL (ref 4.0–10.5)
nRBC: 0 % (ref 0.0–0.2)

## 2023-05-28 MED ORDER — HYDROCODONE-ACETAMINOPHEN 5-325 MG PO TABS: 1.0000 | ORAL_TABLET | Freq: Four times a day (QID) | ORAL | 0 refills | Status: DC | PRN

## 2023-05-28 MED ORDER — OXYCODONE-ACETAMINOPHEN 5-325 MG PO TABS
2.0000 | ORAL_TABLET | Freq: Once | ORAL | Status: AC
  Administered 2023-05-28: 2 via ORAL
  Filled 2023-05-28: qty 2

## 2023-05-28 NOTE — ED Notes (Signed)
PTAR called pt to [facility], within the hour

## 2023-05-28 NOTE — ED Provider Notes (Signed)
[place] EMERGENCY DEPARTMENT AT [facility] Provider Note   CSN: [id number] Arrival date & time: 05/28/23  4098     History  Chief Complaint  Patient presents with   Groin Pain    [name] is a 67 y.o. male.   Groin Pain   This patient is a 67 year old male, treated for hypertension with losartan, has had a left hip that had to be "rebuilt" 20 years ago and then had a recurrent fracture recently, review of the medical record shows that the patient had been admitted to the hospital at the beginning of April approximately 2 months ago because of a left hip fracture, since that time the patient has been seen in the emergency department because of GI bleeding, admitted in April then again in May then again on May 14 with abdominal pain and melena.  He has known peptic ulcer disease.  Unfortunately the patient is currently at [facility] where he was sent for rehab but according to the patient he has not been out of the bed since that time because he cannot move either of his legs because of severe pain in his hips and groin which is chronic and daily.  As part of his ER workup she had a CT scan of the abdomen and pelvis performed on May 14 at which time there was no acute abdominal or pelvic processes seen, he has left pelvic chronic plating and a left femoral intramedullary rod, he has a known comminuted distal femur fracture which was seen in April, redemonstrated in May, part of why he has not been ambulatory  The patient had the paramedics called today because of pain in his hip, he did not want a wait for an x-ray at the facility, there was no fevers no vomiting or any other symptoms, the pain is poorly controlled with his home pain medicines    Home Medications Prior to Admission medications   Medication Sig Start Date End Date Taking? Authorizing Provider  HYDROcodone-acetaminophen (NORCO/VICODIN) 5-325 MG tablet Take 1 tablet by mouth every 6 (six) hours as  needed. 05/28/23  Yes Eber Hong, MD  acetaminophen (TYLENOL) 325 MG tablet Take 650 mg by mouth every 6 (six) hours as needed for moderate pain, fever or headache.    [provider]  gabapentin (NEURONTIN) 800 MG tablet Take 800 mg by mouth 3 (three) times daily.    [provider]  LORazepam (ATIVAN) 0.5 MG tablet Take 1 tablet (0.5 mg total) by mouth every 8 (eight) hours as needed (nerve provlem). 05/10/23   Bethann Berkshire, MD  losartan (COZAAR) 25 MG tablet Take 25 mg by mouth daily.    [provider]  methocarbamol (ROBAXIN) 500 MG tablet Take 2 tablets (1,000 mg total) by mouth 3 (three) times daily. 05/04/23 06/03/23  Willette Cluster, MD  mirtazapine (REMERON SOL-TAB) 15 MG disintegrating tablet Take 1 tablet (15 mg total) by mouth at bedtime. 04/01/23 03/31/24  Morene Crocker, MD  pantoprazole (PROTONIX) 40 MG tablet Take 1 tablet (40 mg total) by mouth 2 (two) times daily before a meal. Patient not taking: Reported on 05/09/2023 05/04/23 06/03/23  Willette Cluster, MD  senna-docusate (SENOKOT-S) 8.6-50 MG tablet Take 1 tablet by mouth at bedtime. Patient not taking: Reported on 05/09/2023 05/08/23   Willette Cluster, MD  sucralfate (CARAFATE) 1 GM/10ML suspension Take 10 mLs (1 g total) by mouth 4 (four) times daily -  with meals and at bedtime for 10 days. 05/08/23 05/18/23  Willette Cluster, MD      Allergies    Lipitor [atorvastatin calcium]    Review of Systems   Review of Systems  All other systems reviewed and are negative.   Physical Exam Updated Vital Signs BP 113/74 (BP Location: Left Arm)   Pulse 87   Temp 97.6 F (36.4 C) (Oral)   Resp 16   Ht 1.778 m (5\' 10" )   Wt 79.4 kg   SpO2 100%   BMI 25.11 kg/m  Physical Exam Vitals and nursing note reviewed.  Constitutional:      General: He is not in acute distress.    Appearance: He is well-developed.  HENT:     Head: Normocephalic and atraumatic.     Mouth/Throat:     Pharynx: No oropharyngeal  exudate.  Eyes:     General: No scleral icterus.       Right eye: No discharge.        Left eye: No discharge.     Conjunctiva/sclera: Conjunctivae normal.     Pupils: Pupils are equal, round, and reactive to light.  Neck:     Thyroid: No thyromegaly.     Vascular: No JVD.  Cardiovascular:     Rate and Rhythm: Normal rate and regular rhythm.     Heart sounds: Normal heart sounds. No murmur heard.    No friction rub. No gallop.  Pulmonary:     Effort: Pulmonary effort is normal. No respiratory distress.     Breath sounds: Normal breath sounds. No wheezing or rales.  Abdominal:     General: Bowel sounds are normal. There is no distension.     Palpations: Abdomen is soft. There is no mass.     Tenderness: There is no abdominal tenderness.  Genitourinary:    Comments: Rectal exam performed, there is normal-appearing brown stool in the rectum, there are several areas of skin breakdown with stage II decubitus ulcers on the bilateral gluteal areas as well as the sacrum.  The penis and scrotum was examined, there is no breakdown or induration or necrotizing areas in that perineal region Musculoskeletal:        General: Tenderness present.     Cervical back: Normal range of motion and neck supple.     Comments: The patient prefers to lay towards the right with both of his legs tilted towards the right, flexed at the knees and the hips, he has severe pain with any movement of either of his legs, palpation of the inguinal regions reveals no hernias, normal pulses, no masses, no skin breakdown,  Lymphadenopathy:     Cervical: No cervical adenopathy.  Skin:    General: Skin is warm and dry.     Findings: No erythema or rash.  Neurological:     Mental Status: He is alert.     Coordination: Coordination normal.     Comments: He is awake and alert and able to answer questions, uses his arms without difficulty although he is diffusely generally weak  Psychiatric:        Behavior: Behavior normal.      ED Results / Procedures / Treatments   Labs (all labs ordered are listed, but only abnormal results are displayed) Labs Reviewed  COMPREHENSIVE METABOLIC PANEL - Abnormal; Notable for the following components:      Result Value   Glucose, Bld 166 (*)    Calcium 8.4 (*)    Total Protein 6.1 (*)    Albumin 2.5 (*)  All other components within normal limits  CBC WITH DIFFERENTIAL/PLATELET - Abnormal; Notable for the following components:   RBC 3.58 (*)    Hemoglobin 10.6 (*)    HCT 34.7 (*)    All other components within normal limits    EKG None  Radiology DG FEMUR MIN 2 VIEWS LEFT  Result Date: 05/28/2023 CLINICAL DATA:  Left groin pain. No known injury. EXAM: LEFT FEMUR 2 VIEWS; PELVIS - 1-2 VIEW COMPARISON:  Femur radiograph 05/01/2023. Hip radiograph 09/05/2020 FINDINGS: Pelvis: The bones are subjectively under mineralized. Previous left acetabular fracture with intact hardware. 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 No acute pelvic fracture. There may be fusion of the sacroiliac joints. The pubic symphysis is normal. Left femur: Femoral intramedullary nail with proximal and distal locking screw fixation traverse distal femur fracture. Displacement of fracture fragments is grossly unchanged allowing for differences in positioning. There is minimal incomplete callus formation from prior exam. No solid bony bridging. No new fracture. Knee alignment is maintained. No knee joint effusion. Vascular calcifications. IMPRESSION: 1. Unchanged alignment of distal femur fracture post ORIF. Minimal incomplete callus formation from prior exam. 2. Previous left acetabular fracture fixation. 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 Electronically Signed   By: Narda Rutherford M.D.   On: 05/28/2023 09:49   DG Pelvis 1-2 Views  Result Date: 05/28/2023 CLINICAL DATA:  Left groin pain. No known injury. EXAM: LEFT FEMUR 2 VIEWS; PELVIS - 1-2 VIEW  COMPARISON:  Femur radiograph 05/01/2023. Hip radiograph 09/05/2020 FINDINGS: Pelvis: The bones are subjectively under mineralized. Previous left acetabular fracture with intact hardware. 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 No acute pelvic fracture. There may be fusion of the sacroiliac joints. The pubic symphysis is normal. Left femur: Femoral intramedullary nail with proximal and distal locking screw fixation traverse distal femur fracture. Displacement of fracture fragments is grossly unchanged allowing for differences in positioning. There is minimal incomplete callus formation from prior exam. No solid bony bridging. No new fracture. Knee alignment is maintained. No knee joint effusion. Vascular calcifications. IMPRESSION: 1. Unchanged alignment of distal femur fracture post ORIF. Minimal incomplete callus formation from prior exam. 2. Previous left acetabular fracture fixation. 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 Electronically Signed   By: Narda Rutherford M.D.   On: 05/28/2023 09:49    Procedures Procedures    Medications Ordered in ED Medications  oxyCODONE-acetaminophen (PERCOCET/ROXICET) 5-325 MG per tablet 2 tablet (2 tablets Oral Given 05/28/23 0919)    ED Course/ Medical Decision Making/ A&P                             Medical Decision Making Amount and/or Complexity of Data Reviewed Labs: ordered. Radiology: ordered.  Risk Prescription drug management.    This patient presents to the ED for concern of ongoing hip discomfort, this involves an extensive number of treatment options, and is a complaint that carries with it a high risk of complications and morbidity.  The differential diagnosis includes chronic pain, dislocation, fracture, generalized weakness may be worsened with renal failure as he had been admitted with similar in the past with his fall 2 months ago   Co morbidities that complicate the patient  evaluation  Essentially bedbound at this point according to the paramedics and the nursing facility, he is nonambulatory and not able to work with PT because of his severe pain in his  hips   Additional history obtained:  Additional history obtained from medical record External records from outside source obtained and reviewed including prior surgical notes and admission notes   Lab Tests:  I Ordered, and personally interpreted labs.  The pertinent results include: CBC and metabolic panel without significant findings including no leukocytosis   Imaging Studies ordered:  I ordered imaging studies including x-ray of the left hip and the pelvis I independently visualized and interpreted imaging which showed ongoing displaced fracture fragments of the leg, no significant changes, pelvis intact I agree with the radiologist interpretation   Cardiac Monitoring: / EKG:  The patient was maintained on a cardiac monitor.  I personally viewed and interpreted the cardiac monitored which showed an underlying rhythm of: Normal sinus rhythm   Problem List / ED Course / Critical interventions / Medication management  X-rays reviewed and show no signs of new fractures or abnormalities, just severe ongoing arthritis which can certainly cause pain radiating into the hip.  There is no lymphadenopathy or signs of infection I ordered medication including Percocet for pain Reevaluation of the patient after these medicines showed that the patient improved I have reviewed the patients home medicines and have made adjustments as needed   Social Determinants of Health:  Rehab facility, nonambulatory   Test / Admission - Considered:  Considered CT scan however the patient has unchanged exam history and stable vital signs and this would not give any significant benefit at this time.  He has no fever or reason to think that this is a septic joint either.  This pain has been constant and chronic, there is  no significant changes, no redness no warmth no fever no tachycardia no leukocytosis         Final Clinical Impression(s) / ED Diagnoses Final diagnoses:  Left inguinal pain    Rx / DC Orders ED Discharge Orders          Ordered    HYDROcodone-acetaminophen (NORCO/VICODIN) 5-325 MG tablet  Every 6 hours PRN        05/28/23 1038              Eber Hong, MD 05/28/23 1040

## 2023-05-28 NOTE — ED Triage Notes (Signed)
TRIAGE NOTE:  Patient arrives by EMS from [facility] with c/o left groin pain 10/10.   Per Report last received pain medication a 0600.     EMS VITALS: 132/78 82 117 CBG 95%

## 2023-05-28 NOTE — Discharge Instructions (Signed)
Your x-ray is unchanged, your blood work is unremarkable, your vital signs are normal.  Here pain is likely coming from the ongoing pain in your hip.  You will need to follow-up with your orthopedic surgeon within the next 2 weeks  Please continue to try to work with physical therapy  I have given you a small amount of pain medication to help get you through the next couple of days but your family doctor or the doctor at your rehab facility will need to address your ongoing pain needs

## 2023-05-28 NOTE — ED Notes (Signed)
Got patent on the monitor changed patient brief patient is resting with call bell in reach

## 2023-06-18 ENCOUNTER — Emergency Department (HOSPITAL_COMMUNITY): Payer: 59

## 2023-06-18 ENCOUNTER — Other Ambulatory Visit: Payer: Self-pay

## 2023-06-18 ENCOUNTER — Inpatient Hospital Stay (HOSPITAL_COMMUNITY)
Admission: EM | Admit: 2023-06-18 | Discharge: 2023-06-29 | DRG: 623 | Disposition: A | Discharge status: Home Health | Payer: 59 | Attending: Student in an Organized Health Care Education/Training Program | Admitting: Student in an Organized Health Care Education/Training Program

## 2023-06-18 DIAGNOSIS — K269 Duodenal ulcer, unspecified as acute or chronic, without hemorrhage or perforation: Secondary | ICD-10-CM

## 2023-06-18 DIAGNOSIS — Z741 Need for assistance with personal care: Secondary | ICD-10-CM

## 2023-06-18 DIAGNOSIS — E440 Moderate protein-calorie malnutrition: Secondary | ICD-10-CM | POA: Diagnosis present

## 2023-06-18 DIAGNOSIS — L89150 Pressure ulcer of sacral region, unstageable: Secondary | ICD-10-CM

## 2023-06-18 DIAGNOSIS — L89620 Pressure ulcer of left heel, unstageable: Secondary | ICD-10-CM | POA: Diagnosis present

## 2023-06-18 DIAGNOSIS — E43 Unspecified severe protein-calorie malnutrition: Secondary | ICD-10-CM

## 2023-06-18 DIAGNOSIS — M6284 Sarcopenia: Secondary | ICD-10-CM | POA: Diagnosis present

## 2023-06-18 DIAGNOSIS — Z79899 Other long term (current) drug therapy: Secondary | ICD-10-CM

## 2023-06-18 DIAGNOSIS — K921 Melena: Secondary | ICD-10-CM

## 2023-06-18 DIAGNOSIS — L89892 Pressure ulcer of other site, stage 2: Secondary | ICD-10-CM | POA: Diagnosis present

## 2023-06-18 DIAGNOSIS — E785 Hyperlipidemia, unspecified: Secondary | ICD-10-CM | POA: Diagnosis present

## 2023-06-18 DIAGNOSIS — Z7901 Long term (current) use of anticoagulants: Secondary | ICD-10-CM

## 2023-06-18 DIAGNOSIS — G8220 Paraplegia, unspecified: Secondary | ICD-10-CM | POA: Diagnosis present

## 2023-06-18 DIAGNOSIS — I10 Essential (primary) hypertension: Secondary | ICD-10-CM | POA: Diagnosis present

## 2023-06-18 DIAGNOSIS — E162 Hypoglycemia, unspecified: Secondary | ICD-10-CM

## 2023-06-18 DIAGNOSIS — Z6826 Body mass index (BMI) 26.0-26.9, adult: Secondary | ICD-10-CM

## 2023-06-18 DIAGNOSIS — R54 Age-related physical debility: Secondary | ICD-10-CM

## 2023-06-18 DIAGNOSIS — L89310 Pressure ulcer of right buttock, unstageable: Secondary | ICD-10-CM | POA: Diagnosis present

## 2023-06-18 DIAGNOSIS — L089 Local infection of the skin and subcutaneous tissue, unspecified: Secondary | ICD-10-CM

## 2023-06-18 DIAGNOSIS — F17210 Nicotine dependence, cigarettes, uncomplicated: Secondary | ICD-10-CM | POA: Diagnosis present

## 2023-06-18 DIAGNOSIS — R634 Abnormal weight loss: Secondary | ICD-10-CM

## 2023-06-18 DIAGNOSIS — R5381 Other malaise: Secondary | ICD-10-CM | POA: Diagnosis present

## 2023-06-18 DIAGNOSIS — D649 Anemia, unspecified: Secondary | ICD-10-CM | POA: Diagnosis present

## 2023-06-18 DIAGNOSIS — E1169 Type 2 diabetes mellitus with other specified complication: Principal | ICD-10-CM | POA: Diagnosis present

## 2023-06-18 DIAGNOSIS — I4891 Unspecified atrial fibrillation: Secondary | ICD-10-CM | POA: Diagnosis present

## 2023-06-18 DIAGNOSIS — Z7401 Bed confinement status: Secondary | ICD-10-CM

## 2023-06-18 DIAGNOSIS — I159 Secondary hypertension, unspecified: Secondary | ICD-10-CM

## 2023-06-18 DIAGNOSIS — K209 Esophagitis, unspecified without bleeding: Secondary | ICD-10-CM

## 2023-06-18 DIAGNOSIS — R64 Cachexia: Secondary | ICD-10-CM | POA: Diagnosis present

## 2023-06-18 DIAGNOSIS — D62 Acute posthemorrhagic anemia: Secondary | ICD-10-CM

## 2023-06-18 DIAGNOSIS — R8271 Bacteriuria: Secondary | ICD-10-CM

## 2023-06-18 DIAGNOSIS — Z888 Allergy status to other drugs, medicaments and biological substances status: Secondary | ICD-10-CM

## 2023-06-18 DIAGNOSIS — F32A Depression, unspecified: Secondary | ICD-10-CM | POA: Diagnosis present

## 2023-06-18 DIAGNOSIS — M86251 Subacute osteomyelitis, right femur: Principal | ICD-10-CM

## 2023-06-18 DIAGNOSIS — E872 Acidosis, unspecified: Secondary | ICD-10-CM | POA: Diagnosis present

## 2023-06-18 DIAGNOSIS — L89510 Pressure ulcer of right ankle, unstageable: Secondary | ICD-10-CM | POA: Diagnosis present

## 2023-06-18 DIAGNOSIS — Z8711 Personal history of peptic ulcer disease: Secondary | ICD-10-CM

## 2023-06-18 DIAGNOSIS — L03116 Cellulitis of left lower limb: Secondary | ICD-10-CM

## 2023-06-18 DIAGNOSIS — M869 Osteomyelitis, unspecified: Secondary | ICD-10-CM | POA: Diagnosis present

## 2023-06-18 DIAGNOSIS — B372 Candidiasis of skin and nail: Secondary | ICD-10-CM

## 2023-06-18 DIAGNOSIS — K922 Gastrointestinal hemorrhage, unspecified: Secondary | ICD-10-CM

## 2023-06-18 DIAGNOSIS — R296 Repeated falls: Secondary | ICD-10-CM

## 2023-06-18 DIAGNOSIS — N179 Acute kidney failure, unspecified: Secondary | ICD-10-CM

## 2023-06-18 DIAGNOSIS — L89210 Pressure ulcer of right hip, unstageable: Secondary | ICD-10-CM | POA: Diagnosis present

## 2023-06-18 LAB — I-STAT CHEM 8, ED
BUN: 16 mg/dL (ref 8–23)
Calcium, Ion: 0.75 mmol/L — CL (ref 1.15–1.40)
Chloride: 112 mmol/L — ABNORMAL HIGH (ref 98–111)
Creatinine, Ser: 0.7 mg/dL (ref 0.61–1.24)
Glucose, Bld: 153 mg/dL — ABNORMAL HIGH (ref 70–99)
HCT: 46 % (ref 39.0–52.0)
Hemoglobin: 15.6 g/dL (ref 13.0–17.0)
Potassium: 3.7 mmol/L (ref 3.5–5.1)
Sodium: 133 mmol/L — ABNORMAL LOW (ref 135–145)
TCO2: 17 mmol/L — ABNORMAL LOW (ref 22–32)

## 2023-06-18 LAB — CBC WITH DIFFERENTIAL/PLATELET
Abs Immature Granulocytes: 0.1 10*3/uL — ABNORMAL HIGH (ref 0.00–0.07)
Basophils Absolute: 0.1 10*3/uL (ref 0.0–0.1)
Basophils Relative: 0 %
Eosinophils Absolute: 0.2 10*3/uL (ref 0.0–0.5)
Eosinophils Relative: 1 %
HCT: 48.3 % (ref 39.0–52.0)
Hemoglobin: 14.6 g/dL (ref 13.0–17.0)
Immature Granulocytes: 1 %
Lymphocytes Relative: 11 %
Lymphs Abs: 2.3 10*3/uL (ref 0.7–4.0)
MCH: 29.4 pg (ref 26.0–34.0)
MCHC: 30.2 g/dL (ref 30.0–36.0)
MCV: 97.2 fL (ref 80.0–100.0)
Monocytes Absolute: 0.8 10*3/uL (ref 0.1–1.0)
Monocytes Relative: 4 %
Neutro Abs: 16.7 10*3/uL — ABNORMAL HIGH (ref 1.7–7.7)
Neutrophils Relative %: 83 %
Platelets: 459 10*3/uL — ABNORMAL HIGH (ref 150–400)
RBC: 4.97 MIL/uL (ref 4.22–5.81)
RDW: 13.9 % (ref 11.5–15.5)
WBC: 20.3 10*3/uL — ABNORMAL HIGH (ref 4.0–10.5)
nRBC: 0 % (ref 0.0–0.2)

## 2023-06-18 LAB — COMPREHENSIVE METABOLIC PANEL
ALT: 8 U/L (ref 0–44)
AST: 21 U/L (ref 15–41)
Albumin: 2.6 g/dL — ABNORMAL LOW (ref 3.5–5.0)
Alkaline Phosphatase: 122 U/L (ref 38–126)
Anion gap: 13 (ref 5–15)
BUN: 14 mg/dL (ref 8–23)
CO2: 17 mmol/L — ABNORMAL LOW (ref 22–32)
Calcium: 8.8 mg/dL — ABNORMAL LOW (ref 8.9–10.3)
Chloride: 105 mmol/L (ref 98–111)
Creatinine, Ser: 0.82 mg/dL (ref 0.61–1.24)
GFR, Estimated: >60 mL/min (ref >60)
Glucose, Bld: 145 mg/dL — ABNORMAL HIGH (ref 70–99)
Potassium: 4.2 mmol/L (ref 3.5–5.1)
Sodium: 135 mmol/L (ref 135–145)
Total Bilirubin: 0.8 mg/dL (ref 0.3–1.2)
Total Protein: 7.9 g/dL (ref 6.5–8.1)

## 2023-06-18 MED ORDER — SODIUM CHLORIDE 0.9 % IV SOLN
1.0000 g | Freq: Once | INTRAVENOUS | Status: AC
  Administered 2023-06-18: 1 g via INTRAVENOUS
  Filled 2023-06-18: qty 10

## 2023-06-18 MED ORDER — ACETAMINOPHEN 500 MG PO TABS
1000.0000 mg | ORAL_TABLET | Freq: Once | ORAL | Status: AC
  Administered 2023-06-18: 1000 mg via ORAL
  Filled 2023-06-18: qty 2

## 2023-06-18 MED ORDER — CALCIUM CARBONATE ANTACID 500 MG PO CHEW
400.0000 mg | CHEWABLE_TABLET | Freq: Once | ORAL | Status: AC
  Administered 2023-06-18: 400 mg via ORAL
  Filled 2023-06-18: qty 2

## 2023-06-18 MED ORDER — IOHEXOL 350 MG/ML SOLN
75.0000 mL | Freq: Once | INTRAVENOUS | Status: AC | PRN
  Administered 2023-06-18: 75 mL via INTRAVENOUS

## 2023-06-18 MED ORDER — OXYCODONE HCL 5 MG PO TABS
5.0000 mg | ORAL_TABLET | Freq: Once | ORAL | Status: AC
  Administered 2023-06-18: 5 mg via ORAL
  Filled 2023-06-18: qty 1

## 2023-06-18 MED ORDER — SULFAMETHOXAZOLE-TRIMETHOPRIM 800-160 MG PO TABS
1.0000 | ORAL_TABLET | Freq: Once | ORAL | Status: AC
  Administered 2023-06-18: 1 via ORAL
  Filled 2023-06-18: qty 1

## 2023-06-18 MED ORDER — HYDROMORPHONE HCL 1 MG/ML IJ SOLN
0.5000 mg | Freq: Once | INTRAMUSCULAR | Status: AC
  Administered 2023-06-18: 0.5 mg via INTRAVENOUS
  Filled 2023-06-18: qty 1

## 2023-06-18 NOTE — ED Notes (Signed)
Patient turned and cleaned. Pressure dressing applied.

## 2023-06-18 NOTE — ED Triage Notes (Addendum)
Patient BIB EMS from home c/o decubitus ulcer that has worsen over 3wks, 10/10 pain. 110/60, 95%rA fentanyl 

## 2023-06-18 NOTE — ED Provider Notes (Signed)
[place] EMERGENCY DEPARTMENT AT [facility] Provider Note   CSN: [id number] Arrival date & time: 06/18/23  1826     History  Chief Complaint  Patient presents with   decubitus ulcer     [name] is a 67 y.o. male with paraplegia, diabetes, physical deconditioning, malnutrition, history of GI bleeding/gastric ulcer, unstageable decubitus ulcer of sacral region, presents with decub ulcer worsening x 3 weeks, now 10/10 pain. Patient reports that the pain has been getting unbearable for this entire week.  He states that it is been draining fluid as well which is unusual.  He denies any fevers or chills.  Denies any abdominal pain, nausea vomiting diarrhea, chest pain, shortness of breath.  Per chart review was hospitalized from 05/05/23-05/08/23 for dark stools, had gastric ulcers, given protonix/sucralfate, instructed to f/u with GI/PCP. Pt was recommended air mattress, medihoney, and wound care for sacral ulcers at that time. Pt has been bedridden and is sitting in stool/urine at home.  Patient support is limited at home and is cared for by his brother but he has no way to get to a appointments, no method of transportation for him at this time.  Documentation from 05/08/2023 TOC note states that he was prepared to be admitted to SNF at [facility].  Came back to ED on 05/09/2023 from [facility] for pain management.  Another ED note from 05/28/2023 with patient from [facility] with left groin pain.  However patient states that he is no longer in [facility] and is now at home again.  HPI     Home Medications Prior to Admission medications   Medication Sig Start Date End Date Taking? Authorizing Provider  cephALEXin (KEFLEX) 500 MG capsule Take 1 capsule (500 mg total) by mouth 4 (four) times daily for 7 days. 06/19/23 06/26/23 Yes Loetta Rough, MD  sulfamethoxazole-trimethoprim (BACTRIM DS) 800-160 MG tablet Take 1 tablet by mouth 2 (two) times daily for 7 days. 06/19/23  06/26/23 Yes Loetta Rough, MD  acetaminophen (TYLENOL) 325 MG tablet Take 650 mg by mouth every 6 (six) hours as needed for moderate pain, fever or headache.    [provider]  gabapentin (NEURONTIN) 800 MG tablet Take 800 mg by mouth 3 (three) times daily.    [provider]  HYDROcodone-acetaminophen (NORCO/VICODIN) 5-325 MG tablet Take 1 tablet by mouth every 6 (six) hours as needed. 05/28/23   Eber Hong, MD  LORazepam (ATIVAN) 0.5 MG tablet Take 1 tablet (0.5 mg total) by mouth every 8 (eight) hours as needed (nerve provlem). 05/10/23   Bethann Berkshire, MD  losartan (COZAAR) 25 MG tablet Take 25 mg by mouth daily.    [provider]  mirtazapine (REMERON SOL-TAB) 15 MG disintegrating tablet Take 1 tablet (15 mg total) by mouth at bedtime. 04/01/23 03/31/24  Morene Crocker, MD  pantoprazole (PROTONIX) 40 MG tablet Take 1 tablet (40 mg total) by mouth 2 (two) times daily before a meal. Patient not taking: Reported on 05/09/2023 05/04/23 06/03/23  Willette Cluster, MD  senna-docusate (SENOKOT-S) 8.6-50 MG tablet Take 1 tablet by mouth at bedtime. Patient not taking: Reported on 05/09/2023 05/08/23   Willette Cluster, MD  sucralfate (CARAFATE) 1 GM/10ML suspension Take 10 mLs (1 g total) by mouth 4 (four) times daily -  with meals and at bedtime for 10 days. 05/08/23 05/18/23  Willette Cluster, MD      Allergies    Lipitor [atorvastatin calcium]    Review of Systems  Review of Systems A 10 point review of systems was performed and is negative unless otherwise reported in HPI.  Physical Exam Updated Vital Signs BP 125/65   Pulse 81   Temp 97.7 F (36.5 C) (Oral)   Resp (!) 21   SpO2 100%  Physical Exam General: Chronically ill- appearing male, lying in bed.  Smells of urine. HEENT: Sclera anicteric, MMM, trachea midline.  Cardiology: RRR, no murmurs/rubs/gallops. BL radial and DP pulses equal bilaterally.  Resp: Normal respiratory rate and effort. CTAB, no wheezes,  rhonchi, crackles.  Abd: Soft, non-tender, non-distended. No rebound tenderness or guarding.  GU: Deferred. MSK: No peripheral edema or signs of trauma. Extremities without deformity or TTP. No cyanosis or clubbing. No open heel ulcers noted.  Skin: warm, dry. No rashes or lesions. Back: Severe unstagable ulcer in R buttock/hip area, please see photos below. With palpation, it emits copious hazy serous fluid. +TTP. No crepitus palpated.  Neuro: A&Ox4, CNs II-XII grossly intact. Paraplegic. Sensation grossly intact.  Psych: Normal mood and affect.         ED Results / Procedures / Treatments   Labs (all labs ordered are listed, but only abnormal results are displayed) Labs Reviewed  CBC WITH DIFFERENTIAL/PLATELET - Abnormal; Notable for the following components:      Result Value   WBC 20.3 (*)    Platelets 459 (*)    Neutro Abs 16.7 (*)    Abs Immature Granulocytes 0.10 (*)    All other components within normal limits  COMPREHENSIVE METABOLIC PANEL - Abnormal; Notable for the following components:   CO2 17 (*)    Glucose, Bld 145 (*)    Calcium 8.8 (*)    Albumin 2.6 (*)    All other components within normal limits  I-STAT CHEM 8, ED - Abnormal; Notable for the following components:   Sodium 133 (*)    Chloride 112 (*)    Glucose, Bld 153 (*)    Calcium, Ion 0.75 (*)    TCO2 17 (*)    All other components within normal limits    EKG None  Radiology CT ABDOMEN PELVIS W CONTRAST  Result Date: 06/18/2023 CLINICAL DATA:  Worsening decubitus ulcer. EXAM: CT ABDOMEN AND PELVIS WITH CONTRAST TECHNIQUE: Multidetector CT imaging of the abdomen and pelvis was performed using the standard protocol following bolus administration of intravenous contrast. RADIATION DOSE REDUCTION: This exam was performed according to the departmental dose-optimization program which includes automated exposure control, adjustment of the mA and/or kV according to patient size and/or use of iterative  reconstruction technique. CONTRAST:  75mL OMNIPAQUE IOHEXOL 350 MG/ML SOLN COMPARISON:  CT abdomen pelvis dated May 05, 2023. FINDINGS: Lower chest: Trace pleural effusions, greater on the left. Subsegmental atelectasis in the posterior right lower lobe. Stable emphysematous changes and pulmonary cysts. Hepatobiliary: Multiple tiny subcentimeter low-density lesions in the liver remain too small to characterize but are unchanged and likely small cysts or hemangiomas. No new focal liver abnormality. Normal gallbladder. No biliary dilatation. Pancreas: Unremarkable. No pancreatic ductal dilatation or surrounding inflammatory changes. Spleen: Normal in size without focal abnormality. Adrenals/Urinary Tract: Adrenal glands are unremarkable. Kidneys are normal, without renal calculi, focal lesion, or hydronephrosis. Bladder is unremarkable. Stomach/Bowel: Distended stomach. No bowel wall thickening, obstruction, or surrounding inflammatory change. Vascular/Lymphatic: Aortic atherosclerosis. IVC filter again noted. No enlarged abdominal or pelvic lymph nodes. Reproductive: Unchanged mild prostatomegaly. Other: No abdominal wall hernia or abnormality. No abdominopelvic ascites. No pneumoperitoneum. Musculoskeletal: New right posterior hip decubitus  ulcer overlying the greater trochanter with associated subcutaneous emphysema. No discrete fluid collection. No underlying bony destruction or periosteal reaction. Worsened now severe anasarca. Prior ORIF of the left acetabulum and subtrochanteric left femur. Bilateral hip degenerative changes. IMPRESSION: 1. New right posterior hip decubitus ulcer overlying the greater trochanter without underlying bony changes or discrete abscess. 2. Worsened now severe anasarca. 3. Trace pleural effusions, greater on the left. 4. Aortic Atherosclerosis (ICD10-I70.0) and Emphysema (ICD10-J43.9). Electronically Signed   By: Obie Dredge M.D.   On: 06/18/2023 21:45    Procedures Procedures     Medications Ordered in ED Medications  oxyCODONE (Oxy IR/ROXICODONE) immediate release tablet 5 mg (has no administration in time range)  acetaminophen (TYLENOL) tablet 1,000 mg (has no administration in time range)  mirtazapine (REMERON) tablet 15 mg (has no administration in time range)  senna (SENOKOT) tablet 8.6 mg (has no administration in time range)  losartan (COZAAR) tablet 25 mg (has no administration in time range)  gabapentin (NEURONTIN) capsule 800 mg (has no administration in time range)  methocarbamol (ROBAXIN) tablet 500 mg (has no administration in time range)  pantoprazole (PROTONIX) EC tablet 40 mg (has no administration in time range)  acetaminophen (TYLENOL) tablet 1,000 mg (1,000 mg Oral Given 06/18/23 2010)  HYDROmorphone (DILAUDID) injection 0.5 mg (0.5 mg Intravenous Given 06/18/23 2010)  iohexol (OMNIPAQUE) 350 MG/ML injection 75 mL (75 mLs Intravenous Contrast Given 06/18/23 2123)  calcium carbonate (TUMS - dosed in mg elemental calcium) chewable tablet 400 mg of elemental calcium (400 mg of elemental calcium Oral Given 06/18/23 2300)  cefTRIAXone (ROCEPHIN) 1 g in sodium chloride 0.9 % 100 mL IVPB (0 g Intravenous Stopped 06/18/23 2337)  sulfamethoxazole-trimethoprim (BACTRIM DS) 800-160 MG per tablet 1 tablet (1 tablet Oral Given 06/18/23 2303)  oxyCODONE (Oxy IR/ROXICODONE) immediate release tablet 5 mg (5 mg Oral Given 06/18/23 2301)    ED Course/ Medical Decision Making/ A&P                          Medical Decision Making Amount and/or Complexity of Data Reviewed Labs: ordered. Decision-making details documented in ED Course. Radiology: ordered. Decision-making details documented in ED Course.  Risk OTC drugs. Prescription drug management.    This patient presents to the ED for concern of pain in sacral ulcer and concern for infection, this involves an extensive number of treatment options, and is a complaint that carries with it a high risk of  complications and morbidity.  I considered the following differential and admission for this acute, potentially life threatening condition.   MDM:    Concerning for acute infection of his severe sacral decub ulcer with purulent drainage.  Will obtain labs and CT imaging to rule out necrotizing infection.  High degree of concern for wound infection including cellulitis or abscess.  No abscess palpable.  Patient is afebrile at this time with no tachycardia, no signs of sepsis.  I am also concerned that patient is at home with little support and no longer in the skilled nursing facility.  Appears that his sacral decub ulcer may be worsening, patient states that he is unable to get to appointments and has no way to get any help at home.  Patient requests a "letter" from me so that he may get help at home and get a physician to come out of the house.  I discussed with patient that we will need to talk with social work for support.  Clinical  Course as of 06/19/23 0031  Thu Jun 18, 2023  2119 WBC(!): 20.3 +Leukocytosis new from 3 weeks ago [HN]  2120 NEUT#(!): 16.7 With left shift [HN]  2218 CT ABDOMEN PELVIS W CONTRAST 1. New right posterior hip decubitus ulcer overlying the greater trochanter without underlying bony changes or discrete abscess. 2. Worsened now severe anasarca. 3. Trace pleural effusions, greater on the left. 4. Aortic Atherosclerosis (ICD10-I70.0) and Emphysema (ICD10-J43.9).   [HN]  2221 Calcium Ionized(!!): 0.75 I-stat chem8 sometimes not reliable electrolytes, and calcium on CMP is 8.8. He is asymptomatic, will treat w/ calcium carbonate 400 mg [HN]  2232 Will treat with IV ceftriaxone and bactrim here in ED and DC w/ keflex and bactrim for o/p treatment of purulent cellulitis a/w ulcer.  [HN]    Clinical Course User Index [HN] Loetta Rough, MD    Labs: I Ordered, and personally interpreted labs.  The pertinent results include: As listed above  Imaging Studies  ordered: I ordered imaging studies including CT abdomen pelvis with contrast I independently visualized and interpreted imaging. I agree with the radiologist interpretation  Additional history obtained from chart review.    Reevaluation: After the interventions noted above, I reevaluated the patient and found that they have :stayed the same  Social Determinants of Health:  lives at facility  Disposition: Treated patient with IV ceftriaxone and Bactrim here, will DC with Keflex and Bactrim for outpatient treatment and recommended outpatient wound care and PCP follow-up.  Patient with no waiting at home and no method of transportation at this time with minimal support at home.  I believe the patient will be best served with a TOC consult in the morning.  Patient is placed in TOC boarder status and his home meds are ordered.  He is vitally stable at this time. DC pending TOC consult in the AM.  Co morbidities that complicate the patient evaluation  Past Medical History:  Diagnosis Date   Cellulitis 07/17/2020   LEFT HIP   Diabetes mellitus    Dyspnea    Hyperlipidemia    Hypertension      Medicines Meds ordered this encounter  Medications   acetaminophen (TYLENOL) tablet 1,000 mg   HYDROmorphone (DILAUDID) injection 0.5 mg   iohexol (OMNIPAQUE) 350 MG/ML injection 75 mL   calcium carbonate (TUMS - dosed in mg elemental calcium) chewable tablet 400 mg of elemental calcium   cefTRIAXone (ROCEPHIN) 1 g in sodium chloride 0.9 % 100 mL IVPB    Order Specific Question:   Antibiotic Indication:    Answer:   Cellulitis   sulfamethoxazole-trimethoprim (BACTRIM DS) 800-160 MG per tablet 1 tablet   oxyCODONE (Oxy IR/ROXICODONE) immediate release tablet 5 mg   oxyCODONE (Oxy IR/ROXICODONE) immediate release tablet 5 mg   acetaminophen (TYLENOL) tablet 1,000 mg   mirtazapine (REMERON) tablet 15 mg   senna (SENOKOT) tablet 8.6 mg   DISCONTD: pantoprazole (PROTONIX) EC tablet 40 mg    losartan (COZAAR) tablet 25 mg   gabapentin (NEURONTIN) capsule 800 mg   methocarbamol (ROBAXIN) tablet 500 mg   pantoprazole (PROTONIX) EC tablet 40 mg   cephALEXin (KEFLEX) 500 MG capsule    Sig: Take 1 capsule (500 mg total) by mouth 4 (four) times daily for 7 days.    Dispense:  28 capsule    Refill:  0   sulfamethoxazole-trimethoprim (BACTRIM DS) 800-160 MG tablet    Sig: Take 1 tablet by mouth 2 (two) times daily for 7 days.    Dispense:  14 tablet    Refill:  0    I have reviewed the patients home medicines and have made adjustments as needed  Problem List / ED Course: Problem List Items Addressed This Visit   None Visit Diagnoses     Decubitus ulcer, unstageable with infection (HCC)    -  Primary   Relevant Medications   cefTRIAXone (ROCEPHIN) 1 g in sodium chloride 0.9 % 100 mL IVPB (Completed)   sulfamethoxazole-trimethoprim (BACTRIM DS) 800-160 MG per tablet 1 tablet (Completed)   cephALEXin (KEFLEX) 500 MG capsule   sulfamethoxazole-trimethoprim (BACTRIM DS) 800-160 MG tablet   Need for assistance with personal care                       This note was created using dictation software, which may contain spelling or grammatical errors.    Loetta Rough, MD 06/19/23 [phone]

## 2023-06-19 DIAGNOSIS — L89210 Pressure ulcer of right hip, unstageable: Secondary | ICD-10-CM | POA: Diagnosis present

## 2023-06-19 LAB — LACTIC ACID, PLASMA: Lactic Acid, Venous: 1 mmol/L (ref 0.5–1.9)

## 2023-06-19 MED ORDER — GABAPENTIN 400 MG PO CAPS
800.0000 mg | ORAL_CAPSULE | Freq: Three times a day (TID) | ORAL | Status: DC
  Administered 2023-06-19 – 2023-06-29 (×31): 800 mg via ORAL
  Filled 2023-06-19 (×8): qty 2
  Filled 2023-06-19: qty 8
  Filled 2023-06-19 (×22): qty 2

## 2023-06-19 MED ORDER — PANTOPRAZOLE SODIUM 40 MG PO TBEC: 40.0000 mg | DELAYED_RELEASE_TABLET | Freq: Every day | ORAL | Status: DC

## 2023-06-19 MED ORDER — SENNA 8.6 MG PO TABS
1.0000 | ORAL_TABLET | Freq: Every day | ORAL | Status: DC
  Administered 2023-06-25 – 2023-06-28 (×4): 8.6 mg via ORAL
  Filled 2023-06-19 (×10): qty 1

## 2023-06-19 MED ORDER — METHOCARBAMOL 500 MG PO TABS: 1000.0000 mg | ORAL_TABLET | Freq: Three times a day (TID) | ORAL | Status: DC | PRN

## 2023-06-19 MED ORDER — CEPHALEXIN 500 MG PO CAPS: 500.0000 mg | ORAL_CAPSULE | Freq: Four times a day (QID) | ORAL | 0 refills | Status: DC

## 2023-06-19 MED ORDER — OXYCODONE HCL 5 MG PO TABS: 5.0000 mg | ORAL_TABLET | ORAL | Status: DC | PRN

## 2023-06-19 MED ORDER — METRONIDAZOLE 500 MG/100ML IV SOLN
500.0000 mg | Freq: Once | INTRAVENOUS | Status: AC
  Administered 2023-06-19: 500 mg via INTRAVENOUS
  Filled 2023-06-19: qty 100

## 2023-06-19 MED ORDER — SULFAMETHOXAZOLE-TRIMETHOPRIM 800-160 MG PO TABS: 1.0000 | ORAL_TABLET | Freq: Two times a day (BID) | ORAL | 0 refills | Status: DC

## 2023-06-19 MED ORDER — LOSARTAN POTASSIUM 50 MG PO TABS
25.0000 mg | ORAL_TABLET | Freq: Every day | ORAL | Status: DC
  Administered 2023-06-19 – 2023-06-29 (×11): 25 mg via ORAL
  Filled 2023-06-19 (×11): qty 1

## 2023-06-19 MED ORDER — OXYCODONE HCL 5 MG PO TABS
5.0000 mg | ORAL_TABLET | ORAL | Status: DC | PRN
  Administered 2023-06-19 – 2023-06-29 (×22): 5 mg via ORAL
  Filled 2023-06-19 (×22): qty 1

## 2023-06-19 MED ORDER — VANCOMYCIN HCL 1500 MG/300ML IV SOLN
1500.0000 mg | Freq: Once | INTRAVENOUS | Status: AC
  Administered 2023-06-19: 1500 mg via INTRAVENOUS
  Filled 2023-06-19: qty 300

## 2023-06-19 MED ORDER — METHOCARBAMOL 500 MG PO TABS
500.0000 mg | ORAL_TABLET | Freq: Three times a day (TID) | ORAL | Status: DC
  Administered 2023-06-19 – 2023-06-29 (×31): 500 mg via ORAL
  Filled 2023-06-19 (×31): qty 1

## 2023-06-19 MED ORDER — SODIUM CHLORIDE 0.9 % IV SOLN
2.0000 g | INTRAVENOUS | Status: DC
  Administered 2023-06-19 – 2023-06-29 (×11): 2 g via INTRAVENOUS
  Filled 2023-06-19 (×12): qty 20

## 2023-06-19 MED ORDER — MIRTAZAPINE 15 MG PO TABS
15.0000 mg | ORAL_TABLET | Freq: Every day | ORAL | Status: DC
  Administered 2023-06-19: 15 mg via ORAL
  Filled 2023-06-19: qty 1

## 2023-06-19 MED ORDER — MEDIHONEY WOUND/BURN DRESSING EX PSTE
1.0000 | PASTE | Freq: Every day | CUTANEOUS | Status: DC
  Filled 2023-06-19: qty 44

## 2023-06-19 MED ORDER — POLYETHYLENE GLYCOL 3350 17 G PO PACK: 17.0000 g | PACK | Freq: Every day | ORAL | Status: DC | PRN

## 2023-06-19 MED ORDER — SODIUM CHLORIDE 0.9 % IV SOLN: 1.0000 g | Freq: Once | INTRAVENOUS | Status: DC

## 2023-06-19 MED ORDER — ENOXAPARIN SODIUM 40 MG/0.4ML IJ SOSY
40.0000 mg | PREFILLED_SYRINGE | INTRAMUSCULAR | Status: DC
  Administered 2023-06-19 – 2023-06-27 (×8): 40 mg via SUBCUTANEOUS
  Filled 2023-06-19 (×9): qty 0.4

## 2023-06-19 MED ORDER — DAKINS (1/4 STRENGTH) 0.125 % EX SOLN
Freq: Two times a day (BID) | CUTANEOUS | Status: DC
  Filled 2023-06-19: qty 473

## 2023-06-19 MED ORDER — GABAPENTIN 300 MG PO CAPS
800.0000 mg | ORAL_CAPSULE | Freq: Three times a day (TID) | ORAL | Status: DC
  Administered 2023-06-19: 800 mg via ORAL
  Filled 2023-06-19: qty 2

## 2023-06-19 MED ORDER — SODIUM CHLORIDE 0.9 % IV SOLN: 2.0000 g | INTRAVENOUS | Status: DC

## 2023-06-19 MED ORDER — VANCOMYCIN HCL IN DEXTROSE 1-5 GM/200ML-% IV SOLN
1000.0000 mg | Freq: Two times a day (BID) | INTRAVENOUS | Status: DC
  Administered 2023-06-19 – 2023-06-23 (×9): 1000 mg via INTRAVENOUS
  Filled 2023-06-19 (×9): qty 200

## 2023-06-19 MED ORDER — ACETAMINOPHEN 650 MG RE SUPP: 650.0000 mg | Freq: Four times a day (QID) | RECTAL | Status: DC | PRN

## 2023-06-19 MED ORDER — MIRTAZAPINE 15 MG PO TBDP
15.0000 mg | ORAL_TABLET | Freq: Every day | ORAL | Status: DC
  Administered 2023-06-19 – 2023-06-28 (×10): 15 mg via ORAL
  Filled 2023-06-19 (×12): qty 1

## 2023-06-19 MED ORDER — PANTOPRAZOLE SODIUM 40 MG PO TBEC
40.0000 mg | DELAYED_RELEASE_TABLET | Freq: Two times a day (BID) | ORAL | Status: DC
  Administered 2023-06-19 – 2023-06-29 (×22): 40 mg via ORAL
  Filled 2023-06-19 (×22): qty 1

## 2023-06-19 MED ORDER — ACETAMINOPHEN 500 MG PO TABS
1000.0000 mg | ORAL_TABLET | Freq: Three times a day (TID) | ORAL | Status: DC
  Filled 2023-06-19: qty 2

## 2023-06-19 MED ORDER — METRONIDAZOLE 500 MG/100ML IV SOLN: 500.0000 mg | Freq: Two times a day (BID) | INTRAVENOUS | Status: DC

## 2023-06-19 MED ORDER — ACETAMINOPHEN 325 MG PO TABS: 650.0000 mg | ORAL_TABLET | Freq: Four times a day (QID) | ORAL | Status: DC | PRN

## 2023-06-19 MED ORDER — MEDIHONEY WOUND/BURN DRESSING EX PSTE
1.0000 | PASTE | Freq: Every day | CUTANEOUS | Status: DC
  Administered 2023-06-21: 1 via TOPICAL
  Filled 2023-06-19: qty 44

## 2023-06-19 NOTE — Consult Note (Addendum)
WOC Nurse Consult Note: this patient is familiar to WOC team from previous admissions with longstanding multiple pressure injuries Reason for Consult: multiple pressure injuries  Wound type: Pressure Pressure Injury POA: Yes  Measurement: 1,  L medial heel 3 cm x 3 cm total Unstageable Pressure Injury with 1 cm x 2 cm black eschar some yellow tissue surrounding  2.  R medial foot (near base of great toe) Unstable Pressure Injury 1 cm x 1 cm 100% soft brown eschar  3.  R lateral ankle 3 cm x 2.5 cm Unstageable Pressure Injury 100% yellow slough  4.  2 separate areas of Unstageable Pressure Injury sacrum; most superior 2 cm x 2 cm 50% yellow slough 50% pink and moist; distal 2 cm x 3 cm 50% yellow slough 50% pink moist  5.  R buttock Unstageable Pressure Injury 6 cm x 9 cm x 3 cm 100% necrotic black yellow tissue malodorous  6.  Scattered areas of Stage 2 R upper thigh 100% pink moist   Drainage (amount, consistency, odor) L heel, R ankle, sacrum with scant tan exudate; R buttock wound with copious malodorous tan exudate and bleeding when necrotic tissue touched  Periwound: intact  Dressing procedure/placement/frequency:  Clean L medial heel, R lateral ankle and sacral wounds with NS, apply Medihoney to wound bed daily Cover with dry gauze and silicone foam. May lift foam daily to reapply Medihoney.  Change foam dressings q3 days and prn soiling.   R buttock wound clean with NS, apply Dakin's moistened (not saturated) gauze to wound bed twice daily x 3 days then begin Medihoney daily.   Stage 2 R upper thigh cover with silicone foam. Lift foam daily to assess area.  Change foam dressing q 3 days and prn soiling.  R medial foot paint with Betadine and let air dry.    Bilateral feet should be placed in Prevalon boots to offload pressure.  Patient should remain on low air loss mattress for pressure redistribution and moisture management.    Discussed POC with bedside nurse and sent primary MD a  secure chat regarding R buttock wound being evaluated by surgery for possible surgical debridement.   WOC team will not follow at this time. Re-consult if further needs arise.   Thank you,    Priscella Mann MSN, RN-BC, [employer] [phone]

## 2023-06-19 NOTE — ED Notes (Signed)
ED TO INPATIENT HANDOFF REPORT  ED Nurse Name and Phone #: [phone]  S Name/Age/Gender [name] 67 y.o. male Room/Bed: H015C/H015C  Code Status   Code Status: Full Code  Home/SNF/Other Home Patient oriented to: self, place, time, and situation Is this baseline? Yes   Triage Complete: Triage complete  Chief Complaint Decubitus ulcer of right hip, unstageable (HCC) [W29.562]  Triage Note Patient BIB EMS from home c/o decubitus ulcer that has worsen over 3wks, 10/10 pain. 110/60, 95%rA fentanyl    Allergies Allergies  Allergen Reactions   Lipitor [Atorvastatin Calcium] Other (See Comments)    Myalgias     Level of Care/Admitting Diagnosis ED Disposition     ED Disposition  Admit   Condition  --   Comment  Hospital Area: [facility] [[id number]]  Level of Care: Med-Surg [16]  May place patient in observation at [facility] or [facility] if equivalent level of care is available:: No  Covid Evaluation: Asymptomatic - no recent exposure (last 10 days) testing not required  Diagnosis: Decubitus ulcer of right hip, unstageable [facility]) [[id number]]  Admitting Physician: Mercie Eon [[id number]]  Attending Physician: Mercie Eon [[id number]]          B Medical/Surgery History Past Medical History:  Diagnosis Date   Cellulitis 07/17/2020   LEFT HIP   Diabetes mellitus    Dyspnea    Hyperlipidemia    Hypertension    Past Surgical History:  Procedure Laterality Date   BIOPSY  05/01/2023   Procedure: BIOPSY;  Surgeon: Beverley Fiedler, MD;  Location: [facility] ENDOSCOPY;  Service: Gastroenterology;;   ESOPHAGOGASTRODUODENOSCOPY (EGD) WITH PROPOFOL N/A 05/01/2023   Procedure: ESOPHAGOGASTRODUODENOSCOPY (EGD) WITH PROPOFOL;  Surgeon: Beverley Fiedler, MD;  Location: [facility] ENDOSCOPY;  Service: Gastroenterology;  Laterality: N/A;   FEMUR IM NAIL Left 03/27/2023   Procedure: INTRAMEDULLARY (IM) RETROGRADE FEMORAL NAILING;  Surgeon: Myrene Galas, MD;  Location:  [facility] OR;  Service: Orthopedics;  Laterality: Left;   SPINE SURGERY       A IV Location/Drains/Wounds Patient Lines/Drains/Airways Status     Active Line/Drains/Airways     Name Placement date Placement time Site Days   Peripheral IV 06/18/23 20 G Anterior;Distal;Upper;Left Antecubital 06/18/23  1832  Antecubital  1   Pressure Injury 04/30/23 Hip Anterior;Left;Proximal Stage 2 -  Partial thickness loss of dermis presenting as a shallow open injury with a red, pink wound bed without slough. close to R hip 04/30/23  2200  -- 50   Pressure Injury 04/30/23 Hip Anterior;Proximal;Right Stage 2 -  Partial thickness loss of dermis presenting as a shallow open injury with a red, pink wound bed without slough. 1/2 cm by 0.8cm by 0.2 cm 04/30/23  2200  -- 50   Pressure Injury 04/30/23 Heel Right;Left Stage 1 -  Intact skin with non-blanchable redness of a localized area usually over a bony prominence. soft pressure on heels bilaterally foams applied 04/30/23  2200  -- 50   Wound / Incision (Open or Dehisced) 08/02/20 Sacrum Medial 08/02/20  1458  Sacrum  1051   Wound / Incision (Open or Dehisced) 08/03/20 Hip Left 08/03/20  1513  Hip  1050   Wound / Incision (Open or Dehisced) 09/07/20 (MASD) Moisture Associated Skin Damage Pelvis Anterior 09/07/20  0800  Pelvis  1015            Intake/Output Last 24 hours  Intake/Output Summary (Last 24 hours) at 06/19/2023 1223 Last data filed at 06/19/2023 1207 Gross per  24 hour  Intake 100.63 ml  Output 120 ml  Net -19.37 ml    Labs/Imaging Results for orders placed or performed during the hospital encounter of 06/18/23 (from the past 48 hour(s))  CBC with Differential     Status: Abnormal   Collection Time: 06/18/23  8:25 PM  Result Value Ref Range   WBC 20.3 (H) 4.0 - 10.5 K/uL   RBC 4.97 4.22 - 5.81 MIL/uL   Hemoglobin 14.6 13.0 - 17.0 g/dL   HCT 16.1 09.6 - 04.5 %   MCV 97.2 80.0 - 100.0 fL   MCH 29.4 26.0 - 34.0 pg   MCHC 30.2 30.0 - 36.0 g/dL    RDW 40.9 81.1 - 91.4 %   Platelets 459 (H) 150 - 400 K/uL   nRBC 0.0 0.0 - 0.2 %   Neutrophils Relative % 83 %   Neutro Abs 16.7 (H) 1.7 - 7.7 K/uL   Lymphocytes Relative 11 %   Lymphs Abs 2.3 0.7 - 4.0 K/uL   Monocytes Relative 4 %   Monocytes Absolute 0.8 0.1 - 1.0 K/uL   Eosinophils Relative 1 %   Eosinophils Absolute 0.2 0.0 - 0.5 K/uL   Basophils Relative 0 %   Basophils Absolute 0.1 0.0 - 0.1 K/uL   Immature Granulocytes 1 %   Abs Immature Granulocytes 0.10 (H) 0.00 - 0.07 K/uL    Comment: Performed at [facility], [address], [place], [place] [address]  Comprehensive metabolic panel     Status: Abnormal   Collection Time: 06/18/23  8:25 PM  Result Value Ref Range   Sodium 135 135 - 145 mmol/L   Potassium 4.2 3.5 - 5.1 mmol/L   Chloride 105 98 - 111 mmol/L   CO2 17 (L) 22 - 32 mmol/L   Glucose, Bld 145 (H) 70 - 99 mg/dL    Comment: Glucose reference range applies only to samples taken after fasting for at least 8 hours.   BUN 14 8 - 23 mg/dL   Creatinine, Ser 6.21 0.61 - 1.24 mg/dL   Calcium 8.8 (L) 8.9 - 10.3 mg/dL   Total Protein 7.9 6.5 - 8.1 g/dL   Albumin 2.6 (L) 3.5 - 5.0 g/dL   AST 21 15 - 41 U/L   ALT 8 0 - 44 U/L   Alkaline Phosphatase 122 38 - 126 U/L   Total Bilirubin 0.8 0.3 - 1.2 mg/dL   GFR, Estimated >30 >86 mL/min    Comment: (NOTE) Calculated using the CKD-EPI Creatinine Equation (2021)    Anion gap 13 5 - 15    Comment: Performed at [facility], [address] [place], [place] [address]  I-stat chem 8, ED (not at [facility], [facility] or [facility])     Status: Abnormal   Collection Time: 06/18/23  8:27 PM  Result Value Ref Range   Sodium 133 (L) 135 - 145 mmol/L   Potassium 3.7 3.5 - 5.1 mmol/L   Chloride 112 (H) 98 - 111 mmol/L   BUN 16 8 - 23 mg/dL   Creatinine, Ser 9.62 0.61 - 1.24 mg/dL   Glucose, Bld 952 (H) 70 - 99 mg/dL    Comment: Glucose reference range applies only to samples taken after fasting for at least 8 hours.   Calcium,  Ion 0.75 (LL) 1.15 - 1.40 mmol/L   TCO2 17 (L) 22 - 32 mmol/L   Hemoglobin 15.6 13.0 - 17.0 g/dL   HCT 84.1 32.4 - 40.1 %  Comment NOTIFIED PHYSICIAN    CT ABDOMEN PELVIS W CONTRAST  Result Date: 06/18/2023 CLINICAL DATA:  Worsening decubitus ulcer. EXAM: CT ABDOMEN AND PELVIS WITH CONTRAST TECHNIQUE: Multidetector CT imaging of the abdomen and pelvis was performed using the standard protocol following bolus administration of intravenous contrast. RADIATION DOSE REDUCTION: This exam was performed according to the departmental dose-optimization program which includes automated exposure control, adjustment of the mA and/or kV according to patient size and/or use of iterative reconstruction technique. CONTRAST:  75mL OMNIPAQUE IOHEXOL 350 MG/ML SOLN COMPARISON:  CT abdomen pelvis dated May 05, 2023. FINDINGS: Lower chest: Trace pleural effusions, greater on the left. Subsegmental atelectasis in the posterior right lower lobe. Stable emphysematous changes and pulmonary cysts. Hepatobiliary: Multiple tiny subcentimeter low-density lesions in the liver remain too small to characterize but are unchanged and likely small cysts or hemangiomas. No new focal liver abnormality. Normal gallbladder. No biliary dilatation. Pancreas: Unremarkable. No pancreatic ductal dilatation or surrounding inflammatory changes. Spleen: Normal in size without focal abnormality. Adrenals/Urinary Tract: Adrenal glands are unremarkable. Kidneys are normal, without renal calculi, focal lesion, or hydronephrosis. Bladder is unremarkable. Stomach/Bowel: Distended stomach. No bowel wall thickening, obstruction, or surrounding inflammatory change. Vascular/Lymphatic: Aortic atherosclerosis. IVC filter again noted. No enlarged abdominal or pelvic lymph nodes. Reproductive: Unchanged mild prostatomegaly. Other: No abdominal wall hernia or abnormality. No abdominopelvic ascites. No pneumoperitoneum. Musculoskeletal: New right posterior hip  decubitus ulcer overlying the greater trochanter with associated subcutaneous emphysema. No discrete fluid collection. No underlying bony destruction or periosteal reaction. Worsened now severe anasarca. Prior ORIF of the left acetabulum and subtrochanteric left femur. Bilateral hip degenerative changes. IMPRESSION: 1. New right posterior hip decubitus ulcer overlying the greater trochanter without underlying bony changes or discrete abscess. 2. Worsened now severe anasarca. 3. Trace pleural effusions, greater on the left. 4. Aortic Atherosclerosis (ICD10-I70.0) and Emphysema (ICD10-J43.9). Electronically Signed   By: Obie Dredge M.D.   On: 06/18/2023 21:45    Pending Labs Unresulted Labs (From admission, onward)     Start     Ordered   06/20/23 0500  Basic metabolic panel  Tomorrow morning,   R        06/19/23 1132   06/20/23 0500  CBC  Tomorrow morning,   R        06/19/23 1132   06/19/23 1007  Lactic acid  Once,   STAT        06/19/23 1007   06/19/23 1007  Blood culture (routine x 2)  BLOOD CULTURE X 2,   R (with STAT occurrences)      06/19/23 1007            Vitals/Pain Today's Vitals   06/18/23 2315 06/19/23 0200 06/19/23 0703 06/19/23 0718  BP: 125/65 132/67    Pulse: 81 91    Resp: (!) 21 14    Temp:      TempSrc:      SpO2: 100% 100%    PainSc: 9   0-No pain 0-No pain    Isolation Precautions No active isolations  Medications Medications  oxyCODONE (Oxy IR/ROXICODONE) immediate release tablet 5 mg (has no administration in time range)  senna (SENOKOT) tablet 8.6 mg (8.6 mg Oral Not Given 06/19/23 0105)  losartan (COZAAR) tablet 25 mg (has no administration in time range)  methocarbamol (ROBAXIN) tablet 500 mg (has no administration in time range)  pantoprazole (PROTONIX) EC tablet 40 mg (40 mg Oral Given 06/19/23 0105)  cefTRIAXone (ROCEPHIN) 1 g in sodium chloride  0.9 % 100 mL IVPB (has no administration in time range)  metroNIDAZOLE (FLAGYL) IVPB 500 mg (has  no administration in time range)  vancomycin (VANCOREADY) IVPB 1500 mg/300 mL (1,500 mg Intravenous New Bag/Given 06/19/23 1220)  vancomycin (VANCOCIN) IVPB 1000 mg/200 mL premix (has no administration in time range)  mirtazapine (REMERON SOL-TAB) disintegrating tablet 15 mg (has no administration in time range)  gabapentin (NEURONTIN) capsule 800 mg (has no administration in time range)  enoxaparin (LOVENOX) injection 40 mg (has no administration in time range)  acetaminophen (TYLENOL) tablet 650 mg (has no administration in time range)    Or  acetaminophen (TYLENOL) suppository 650 mg (has no administration in time range)  polyethylene glycol (MIRALAX / GLYCOLAX) packet 17 g (has no administration in time range)  cefTRIAXone (ROCEPHIN) 2 g in sodium chloride 0.9 % 100 mL IVPB (has no administration in time range)  metroNIDAZOLE (FLAGYL) IVPB 500 mg (has no administration in time range)  acetaminophen (TYLENOL) tablet 1,000 mg (1,000 mg Oral Given 06/18/23 2010)  HYDROmorphone (DILAUDID) injection 0.5 mg (0.5 mg Intravenous Given 06/18/23 2010)  iohexol (OMNIPAQUE) 350 MG/ML injection 75 mL (75 mLs Intravenous Contrast Given 06/18/23 2123)  calcium carbonate (TUMS - dosed in mg elemental calcium) chewable tablet 400 mg of elemental calcium (400 mg of elemental calcium Oral Given 06/18/23 2300)  cefTRIAXone (ROCEPHIN) 1 g in sodium chloride 0.9 % 100 mL IVPB (0 g Intravenous Stopped 06/18/23 2337)  sulfamethoxazole-trimethoprim (BACTRIM DS) 800-160 MG per tablet 1 tablet (1 tablet Oral Given 06/18/23 2303)  oxyCODONE (Oxy IR/ROXICODONE) immediate release tablet 5 mg (5 mg Oral Given 06/18/23 2301)    Mobility non-ambulatory     Focused Assessments Patient is being admitted, because of unstage wound on right hip   R Recommendations: See Admitting Provider Note  Report given to:   Additional Notes: Pt is getting on antibiotics. I started the Vancomycin, he need other. He should have a wound  care order. I think the MD said he does not have osteomyelitis in the wound from the image he got

## 2023-06-19 NOTE — ED Provider Notes (Signed)
Patient was reassessed this morning.  I am concerned about his leukocytosis and his foul-smelling decubitus ulcer.  At this point I think he is likely need to be readmitted for potential osteomyelitis.  I ordered his antibiotics and blood cultures.  I have admitted to internal medicine service.   Terald Sleeper, MD 06/19/23 1026

## 2023-06-19 NOTE — Discharge Instructions (Addendum)
Thank you for coming to [facility] Emergency Department. You were seen for pain and concern for infection of pressure ulcer. We did an exam, labs, and imaging, and these showed likely an infection of the sacral decubitus ulcer on your right side. Please follow with wound care for proper care of this wound. Please follow up with your primary care provider within 1 week. We have prescribed the antibiotics keflex and bactrim to take for one week.  Do not hesitate to return to the ED or call 911 if you experience: -Worsening symptoms -Lightheadedness, passing out -Fevers/chills -Anything else that concerns you

## 2023-06-19 NOTE — Progress Notes (Addendum)
Pharmacy Antibiotic Note  [name] is a 67 y.o. male admitted on 06/18/2023 with  c/f osteomyelitis with decubitus ulcer.  Pharmacy has been consulted for vancomycin dosing.  WBC 20.3, afeb on APAP. Lactic ordered.  Using 68kg (03/2023)  Plan: Vancomycin 1500mg  IV x1 followed by vancomycin 1000mg  q 12h (eAUC 462) -levels PRN per protocol F/u cultures, renal function, and clinical picture      Temp (24hrs), Avg:97.7 F (36.5 C), Min:97.7 F (36.5 C), Max:97.7 F (36.5 C)  Recent Labs  Lab 06/18/23 2025 06/18/23 2027  WBC 20.3*  --   CREATININE 0.82 0.70    CrCl cannot be calculated (Unknown ideal weight.).    Allergies  Allergen Reactions   Lipitor [Atorvastatin Calcium] Other (See Comments)    Myalgias     Antimicrobials this admission: Bactrim 6/27 x1 Ceftriaxone 6/28 x1 Flagyl 6/28 x1 Vanc 6/28>    Dose adjustments this admission:   Microbiology results: 6/28 BCX:   Thank you for allowing pharmacy to be a part of this patient's care.  Calton Dach, PharmD Clinical Pharmacist 06/19/2023 10:17 AM

## 2023-06-19 NOTE — H&P (Signed)
Date: 06/19/2023               Patient Name:  [name] MRN: [id number]  DOB: 1956-11-06 Age / Sex: 67 y.o., male   PCP: Pcp, No         Medical Service: Internal Medicine Teaching Service         Attending Physician: Dr. Mercie Eon, MD    First Contact: Dr. Rana Snare  Pager: [phone]  Second Contact: Dr. Champ Mungo  Pager: [phone]       After Hours (After 5p/  First Contact Pager: [phone]  weekends / holidays): Second Contact Pager: [phone]   Chief Complaint: decubitus ulcer  History of Present Illness:   [name] is a [age]-year-old male living with hypertension, paraplegia of the lower half, history of GI bleed, malnutrition, afib who presented to the hospital for worsening right hip ulcer.  Patient was recently admitted to the hospital 3 times in the last 2 months for upper GI bleed from gastric ulcer.  His Eliquis was held after discharge on 5/17.  Patient went to SNF after discharge and stayed there for 6 weeks.  He just went home 2 weeks ago.    He started noticing a wound on his right hip about 1 week ago.  States that wound is getting worse and draining purulent and bloody discharge.  He denies fever endorses chills.  No chest pain or shortness of breath.  He states that he was not being turned frequently at the [facility].  His brother has been taking care of him at home, including turning him, wound dressing, changing his diaper and administer medications.  Both said that patient has been lying on the right side at home.  There has not been any healthcare personnel at his house for the last 2 weeks.  Brother states that his left hip pain has been limiting his ability to take care of patient.  Brother and his wife are trying the best to turn him twice a day.  Patient reports good p.o. intake with 2 meals a day.  No issue with bowel movements.  He denies any melena.  He goes to a pain clinic in [place] for his opioid.  Brother states that they are  getting in touch with a home service and a doctor will be coming out to their house on July 16.  Brother does not remember the name of the agency but thought it was [facility].  Patient states that he will not go back to any [facility] facility.  In the ED, he was hemodynamically stable without fever.  He has a leukocytosis of 20.3.  He has normal kidney function with a mild metabolic acidosis.  His right decubitus ulcer is new since June and is draining serosanguineous fluid.  Patient was admitted for treatment of wound infection and placement issue.  Meds:  -Ibuprofen -Oxycodone 10 mg q4h -Muscle relaxant  Allergies: Allergies as of 06/18/2023 - Reviewed 06/18/2023  Allergen Reaction Noted   Lipitor [atorvastatin calcium] Other (See Comments) 01/28/2012   Past Medical History:  Diagnosis Date   Cellulitis 07/17/2020   LEFT HIP   Diabetes mellitus    Dyspnea    Hyperlipidemia    Hypertension     Family History:  No pertinent family history  Social History:  -Lives at home.  Brother is helping with ADLs. -No alcohol or any substance use -Smokes 3 cigarettes a day -No PCP  Review of Systems: A  complete ROS was negative except as per HPI.   Physical Exam: Blood pressure 132/67, pulse 91, temperature 97.7 F (36.5 C), temperature source Oral, resp. rate 14, SpO2 100 %. Physical Exam Constitutional:      General: He is not in acute distress.    Comments: Disheveled.  His clothes was saturated in urine  Eyes:     General:        Right eye: No discharge.        Left eye: No discharge.     Conjunctiva/sclera: Conjunctivae normal.  Cardiovascular:     Rate and Rhythm: Normal rate and regular rhythm.  Pulmonary:     Effort: Pulmonary effort is normal. No respiratory distress.     Breath sounds: Normal breath sounds. No wheezing.  Abdominal:     General: Bowel sounds are normal. There is no distension.     Palpations: Abdomen is soft.     Tenderness: There is no abdominal  tenderness.  Skin:    Comments: There is a large decubitus ulcer of the right hip that is draining serous/purulent discharge.  The wound is deep with exposed muscle.  There is no obvious erythema of the surrounding skin.  The wound was exposed to urine.  Neurological:     Mental Status: He is alert. Mental status is at baseline.  Psychiatric:        Mood and Affect: Mood normal.         Assessment & Plan by Problem: Principal Problem:   Decubitus ulcer of right hip, unstageable (HCC) Active Problems:   Physical deconditioning   Malnutrition of moderate degree   Paraplegia (HCC)  [name] is a [age]-year-old male living with hypertension, paraplegia of the lower half, history of GI bleed, malnutrition, afib who presented to the hospital for worsening right hip ulcer, being admitted for treatment of right hip decubitus ulcer and placement issue.  Infected right decubitus ulcer This wound is new since the CT scan in 5/14.  Concerning for infection with purulent discharge and leukocytosis.  He has no systemic symptoms with normal lactic acid.  Treatment of this wound include antibiotics, offloading and nutrition. -Continue vancomycin and ceftriaxone.  Low suspicion for anaerobes so we will discontinue metronidazole. -Follow-up blood cultures -Wound care consult -Offloading and frequent turning -RD consult for nutrition -If patient is unable to heal this wound or concern of constant exposure to urine/feces, he may need a diverting colostomy.  Paraplegia Physical deconditioning Per brother, patient was paraplegic due to a complication from his left hip surgery.  He is bedbound and is unable to turn by himself.  He was in a SNF for 6 weeks but still developed this decubitus ulcer.  Patient requested to be home with home health service.  Fortunately he already had a home visit with a physician on July 16.  Will consult TOC to help with home health agency. -Oxycodone, Gabapentin  and baclofen  -Would not consult PT/OT this admission due to this is his baseline function status.  Gastric ulcer Hemoglobin improved to 14 today.  Patient denies any further melanotic stool. -Continue PPI twice daily -He will need a repeat EGD in about July -No NSAIDs  Hypertension Resume losartan  Atrial fibrillation This was mentioned from his last admission.  I do not see any other record of this.  Patient was on Eliquis but was discontinued due to persistent upper GI bleed. -Would not resume Eliquis now due to the lack of record.  He can  follow-up with PCP who can decide the risk/benefit of resuming anticoagulation.  Malnutrition -Resume mirtazapine -RD consult  Full code Diet: Regular DVT: Lovenox IVF: NA  Dispo: Admit patient to Observation with expected length of stay less than 2 midnights.  SignedDoran Stabler, DO 06/19/2023, 11:39 AM  Pager: [phone] After 5pm on weekdays and 1pm on weekends: On Call pager: [phone]

## 2023-06-20 ENCOUNTER — Encounter (HOSPITAL_COMMUNITY): Payer: Self-pay | Admitting: Internal Medicine

## 2023-06-20 DIAGNOSIS — E1169 Type 2 diabetes mellitus with other specified complication: Secondary | ICD-10-CM | POA: Diagnosis present

## 2023-06-20 DIAGNOSIS — I4891 Unspecified atrial fibrillation: Secondary | ICD-10-CM | POA: Diagnosis present

## 2023-06-20 DIAGNOSIS — R600 Localized edema: Secondary | ICD-10-CM | POA: Diagnosis not present

## 2023-06-20 DIAGNOSIS — L89620 Pressure ulcer of left heel, unstageable: Secondary | ICD-10-CM | POA: Diagnosis present

## 2023-06-20 DIAGNOSIS — L8990 Pressure ulcer of unspecified site, unspecified stage: Secondary | ICD-10-CM | POA: Diagnosis not present

## 2023-06-20 DIAGNOSIS — M7989 Other specified soft tissue disorders: Secondary | ICD-10-CM | POA: Diagnosis not present

## 2023-06-20 DIAGNOSIS — Z7189 Other specified counseling: Secondary | ICD-10-CM | POA: Diagnosis not present

## 2023-06-20 DIAGNOSIS — Z66 Do not resuscitate: Secondary | ICD-10-CM | POA: Diagnosis not present

## 2023-06-20 DIAGNOSIS — L089 Local infection of the skin and subcutaneous tissue, unspecified: Secondary | ICD-10-CM | POA: Diagnosis not present

## 2023-06-20 DIAGNOSIS — E872 Acidosis, unspecified: Secondary | ICD-10-CM | POA: Diagnosis present

## 2023-06-20 DIAGNOSIS — M6284 Sarcopenia: Secondary | ICD-10-CM | POA: Diagnosis present

## 2023-06-20 DIAGNOSIS — M62838 Other muscle spasm: Secondary | ICD-10-CM | POA: Diagnosis not present

## 2023-06-20 DIAGNOSIS — J90 Pleural effusion, not elsewhere classified: Secondary | ICD-10-CM | POA: Diagnosis not present

## 2023-06-20 DIAGNOSIS — F32A Depression, unspecified: Secondary | ICD-10-CM | POA: Diagnosis present

## 2023-06-20 DIAGNOSIS — J918 Pleural effusion in other conditions classified elsewhere: Secondary | ICD-10-CM | POA: Diagnosis not present

## 2023-06-20 DIAGNOSIS — M86051 Acute hematogenous osteomyelitis, right femur: Secondary | ICD-10-CM | POA: Diagnosis not present

## 2023-06-20 DIAGNOSIS — L89519 Pressure ulcer of right ankle, unspecified stage: Secondary | ICD-10-CM | POA: Diagnosis not present

## 2023-06-20 DIAGNOSIS — L89310 Pressure ulcer of right buttock, unstageable: Secondary | ICD-10-CM | POA: Diagnosis present

## 2023-06-20 DIAGNOSIS — L89892 Pressure ulcer of other site, stage 2: Secondary | ICD-10-CM | POA: Diagnosis present

## 2023-06-20 DIAGNOSIS — M86151 Other acute osteomyelitis, right femur: Secondary | ICD-10-CM | POA: Diagnosis not present

## 2023-06-20 DIAGNOSIS — Z888 Allergy status to other drugs, medicaments and biological substances status: Secondary | ICD-10-CM | POA: Diagnosis not present

## 2023-06-20 DIAGNOSIS — E46 Unspecified protein-calorie malnutrition: Secondary | ICD-10-CM | POA: Diagnosis not present

## 2023-06-20 DIAGNOSIS — L89210 Pressure ulcer of right hip, unstageable: Secondary | ICD-10-CM | POA: Diagnosis not present

## 2023-06-20 DIAGNOSIS — L89510 Pressure ulcer of right ankle, unstageable: Secondary | ICD-10-CM | POA: Diagnosis present

## 2023-06-20 DIAGNOSIS — Z7401 Bed confinement status: Secondary | ICD-10-CM | POA: Diagnosis not present

## 2023-06-20 DIAGNOSIS — R531 Weakness: Secondary | ICD-10-CM | POA: Diagnosis not present

## 2023-06-20 DIAGNOSIS — L89150 Pressure ulcer of sacral region, unstageable: Secondary | ICD-10-CM | POA: Diagnosis present

## 2023-06-20 DIAGNOSIS — E1165 Type 2 diabetes mellitus with hyperglycemia: Secondary | ICD-10-CM | POA: Diagnosis not present

## 2023-06-20 DIAGNOSIS — F419 Anxiety disorder, unspecified: Secondary | ICD-10-CM | POA: Diagnosis not present

## 2023-06-20 DIAGNOSIS — R627 Adult failure to thrive: Secondary | ICD-10-CM | POA: Diagnosis not present

## 2023-06-20 DIAGNOSIS — B370 Candidal stomatitis: Secondary | ICD-10-CM | POA: Diagnosis not present

## 2023-06-20 DIAGNOSIS — Z741 Need for assistance with personal care: Secondary | ICD-10-CM | POA: Diagnosis present

## 2023-06-20 DIAGNOSIS — J9601 Acute respiratory failure with hypoxia: Secondary | ICD-10-CM | POA: Diagnosis not present

## 2023-06-20 DIAGNOSIS — Z6826 Body mass index (BMI) 26.0-26.9, adult: Secondary | ICD-10-CM | POA: Diagnosis not present

## 2023-06-20 DIAGNOSIS — Z515 Encounter for palliative care: Secondary | ICD-10-CM | POA: Diagnosis not present

## 2023-06-20 DIAGNOSIS — R000 Tachycardia, unspecified: Secondary | ICD-10-CM | POA: Diagnosis not present

## 2023-06-20 DIAGNOSIS — R0603 Acute respiratory distress: Secondary | ICD-10-CM | POA: Diagnosis not present

## 2023-06-20 DIAGNOSIS — I10 Essential (primary) hypertension: Secondary | ICD-10-CM | POA: Diagnosis present

## 2023-06-20 DIAGNOSIS — R1310 Dysphagia, unspecified: Secondary | ICD-10-CM | POA: Diagnosis not present

## 2023-06-20 DIAGNOSIS — R109 Unspecified abdominal pain: Secondary | ICD-10-CM | POA: Diagnosis not present

## 2023-06-20 DIAGNOSIS — D649 Anemia, unspecified: Secondary | ICD-10-CM | POA: Diagnosis present

## 2023-06-20 DIAGNOSIS — E11622 Type 2 diabetes mellitus with other skin ulcer: Secondary | ICD-10-CM | POA: Diagnosis not present

## 2023-06-20 DIAGNOSIS — M8618 Other acute osteomyelitis, other site: Secondary | ICD-10-CM | POA: Diagnosis not present

## 2023-06-20 DIAGNOSIS — L8994 Pressure ulcer of unspecified site, stage 4: Secondary | ICD-10-CM | POA: Diagnosis not present

## 2023-06-20 DIAGNOSIS — Z7901 Long term (current) use of anticoagulants: Secondary | ICD-10-CM | POA: Diagnosis not present

## 2023-06-20 DIAGNOSIS — E43 Unspecified severe protein-calorie malnutrition: Secondary | ICD-10-CM | POA: Diagnosis not present

## 2023-06-20 DIAGNOSIS — F17210 Nicotine dependence, cigarettes, uncomplicated: Secondary | ICD-10-CM | POA: Diagnosis not present

## 2023-06-20 DIAGNOSIS — L89219 Pressure ulcer of right hip, unspecified stage: Secondary | ICD-10-CM | POA: Diagnosis not present

## 2023-06-20 DIAGNOSIS — E785 Hyperlipidemia, unspecified: Secondary | ICD-10-CM | POA: Diagnosis present

## 2023-06-20 DIAGNOSIS — M869 Osteomyelitis, unspecified: Secondary | ICD-10-CM | POA: Diagnosis not present

## 2023-06-20 DIAGNOSIS — J690 Pneumonitis due to inhalation of food and vomit: Secondary | ICD-10-CM | POA: Diagnosis not present

## 2023-06-20 DIAGNOSIS — G8220 Paraplegia, unspecified: Secondary | ICD-10-CM | POA: Diagnosis present

## 2023-06-20 DIAGNOSIS — E440 Moderate protein-calorie malnutrition: Secondary | ICD-10-CM | POA: Diagnosis present

## 2023-06-20 DIAGNOSIS — R64 Cachexia: Secondary | ICD-10-CM | POA: Diagnosis present

## 2023-06-20 DIAGNOSIS — Z4682 Encounter for fitting and adjustment of non-vascular catheter: Secondary | ICD-10-CM | POA: Diagnosis not present

## 2023-06-20 DIAGNOSIS — L89529 Pressure ulcer of left ankle, unspecified stage: Secondary | ICD-10-CM | POA: Diagnosis not present

## 2023-06-20 DIAGNOSIS — Z79899 Other long term (current) drug therapy: Secondary | ICD-10-CM | POA: Diagnosis not present

## 2023-06-20 LAB — BASIC METABOLIC PANEL
Anion gap: 10 (ref 5–15)
BUN: 14 mg/dL (ref 8–23)
CO2: 21 mmol/L — ABNORMAL LOW (ref 22–32)
Calcium: 7.9 mg/dL — ABNORMAL LOW (ref 8.9–10.3)
Chloride: 106 mmol/L (ref 98–111)
Creatinine, Ser: 0.88 mg/dL (ref 0.61–1.24)
GFR, Estimated: >60 mL/min (ref >60)
Glucose, Bld: 115 mg/dL — ABNORMAL HIGH (ref 70–99)
Potassium: 3.8 mmol/L (ref 3.5–5.1)
Sodium: 137 mmol/L (ref 135–145)

## 2023-06-20 LAB — CBC
HCT: 31.7 % — ABNORMAL LOW (ref 39.0–52.0)
Hemoglobin: 10 g/dL — ABNORMAL LOW (ref 13.0–17.0)
MCH: 28.4 pg (ref 26.0–34.0)
MCHC: 31.5 g/dL (ref 30.0–36.0)
MCV: 90.1 fL (ref 80.0–100.0)
Platelets: 355 10*3/uL (ref 150–400)
RBC: 3.52 MIL/uL — ABNORMAL LOW (ref 4.22–5.81)
RDW: 14.3 % (ref 11.5–15.5)
WBC: 19.5 10*3/uL — ABNORMAL HIGH (ref 4.0–10.5)
nRBC: 0 % (ref 0.0–0.2)

## 2023-06-20 LAB — CULTURE, BLOOD (ROUTINE X 2): Culture: NO GROWTH

## 2023-06-20 MED ORDER — ENSURE ENLIVE PO LIQD
237.0000 mL | Freq: Two times a day (BID) | ORAL | Status: DC
  Administered 2023-06-20 – 2023-06-23 (×5): 237 mL via ORAL

## 2023-06-20 MED ORDER — ACETAMINOPHEN 500 MG PO TABS
1000.0000 mg | ORAL_TABLET | Freq: Three times a day (TID) | ORAL | Status: DC
  Administered 2023-06-20 – 2023-06-28 (×20): 1000 mg via ORAL
  Filled 2023-06-20 (×23): qty 2

## 2023-06-20 MED ORDER — JUVEN PO PACK
1.0000 | PACK | Freq: Two times a day (BID) | ORAL | Status: DC
  Administered 2023-06-20 – 2023-06-29 (×16): 1 via ORAL
  Filled 2023-06-20 (×16): qty 1

## 2023-06-20 MED ORDER — ADULT MULTIVITAMIN W/MINERALS CH
1.0000 | ORAL_TABLET | Freq: Every day | ORAL | Status: DC
  Administered 2023-06-20 – 2023-06-29 (×10): 1 via ORAL
  Filled 2023-06-20 (×10): qty 1

## 2023-06-20 NOTE — Progress Notes (Signed)
Initial Nutrition Assessment  DOCUMENTATION CODES:   Not applicable  INTERVENTION:  Continue liberalized regular diet.  Provide double protein portions at meals.  Provide Ensure Enlive po BID with meals, each supplement provides 350 kcal and 20 grams of protein.  Provide Juven 1 packet po BID between meals. Each packet provides 95 kcal, 7 grams L-Arginine, 7 grams L-Glutamine, 2.5 grams collagen protein, 300 mg vitamin C, 9.5 mg zinc, and other micronutrients essential for wound healing.  Provide multivitamin with minerals daily.  Recommend obtaining height and weight measurements.  NUTRITION DIAGNOSIS:   Increased nutrient needs related to wound healing as evidenced by estimated needs.  GOAL:   Patient will meet greater than or equal to 90% of their needs  MONITOR:   PO intake, Supplement acceptance, Labs, Weight trends, Skin, I & O's  REASON FOR ASSESSMENT:   Consult Assessment of nutrition requirement/status  ASSESSMENT:   67 year old male with PMHx of HTN, DM, HLD, paraplegia (bedridden since hospitalized for left femur fracture 03/2023), A-fib, malnutrition, multiple recent hospitalizations, recently discharged from [facility] to home presented with worsening right hip wound and found to have new infected right decubitus ulcer.  Per review of chart patient's family hasn't been around this week so he hasn't been able to rotate positions regularly.  RD working remotely. Attempted to reach patient over the phone but he was unable to answer.  Patient recently seen by RD on 05/01/23. At that time his appetite had been decreased PTA. He had reported typically only eating one meal per day. Patient with increased nutrient needs. He would benefit from interventions to increase provision of calories and protein to promote wound healing. During previous admission reported he was amenable to drinking Ensure supplements.  No height or weight obtained this admission. During  previous admission pt had reported UBW was 350 lbs and that he lost 150 lbs. Family had shared weight loss occurred over 5 years. Per chart patient has been bedridden since hospitalization for femur fracture in 03/2023.  Medications reviewed and include: gabapentin, Remeron 15 mg at bedtime, pantoprazole, senna, ceftriaxone, vancomycin  Labs reviewed: HgbA1c 5.7  UOP: 120 mL + 1 occurrence unmeasured UOP in previous 24 hours  I/O: +280.6 mL  Noted patient previously met criteria for severe malnutrition. Unable to determine if patient meets criteria for malnutrition at this time without history, anthropometrics, or NFPE.   NUTRITION - FOCUSED PHYSICAL EXAM:  Unable to complete at this time.  Diet Order:   Diet Order             Diet regular Room service appropriate? Yes; Fluid consistency: Thin  Diet effective now                  EDUCATION NEEDS:   No education needs have been identified at this time  Skin:  Skin Assessment: Skin Integrity Issues: Skin Integrity Issues:: Stage II, Unstageable Stage II: Scattered areas of Stage 2 R upper thigh Unstageable: L medial heel (3cm x 3cm), R medial foot (1cm x 1cm), R lateral ankle (3cm x 2.5cm), 2 separate areas to sacrum (superior 2cm x 2cm and distal 2cm x 3cm), R buttock (6cm x 9cm x 3cm)  Last BM:  06/18/23 per chart  Height:   Ht Readings from Last 1 Encounters:  05/28/23 5\' 10"  (1.778 m)   Weight:   Wt Readings from Last 1 Encounters:  05/28/23 79.4 kg   BMI:  There is no height or weight on file to  calculate BMI.  Estimated Nutritional Needs:   Kcal:  2200-2400  Protein:  120-140 grams  Fluid:  >/= 2 L/day  Letta Median, MS, RD, LDN, CNSC Pager number available on Amion

## 2023-06-20 NOTE — Progress Notes (Addendum)
HD#0 Subjective:   Summary: [name] is a 67 y.o. male with PMH of HTN, paraplegia of the lower half, history of GI bleed, malnutrition  who presents with worsening right hip ulcer and admitted for evaluation for this.  Overnight Events: none  Patient states pain at his right hip wound. Denies any nausea, vomiting or fevers. Patient states he wants to return home and not a SNF. States his brother can help assist him at home. Discussed the plan with the patient and answered all questions.  Objective:  Vital signs in last 24 hours: Vitals:   06/19/23 1223 06/19/23 1535 06/19/23 1950 06/20/23 0721  BP:  132/69 (!) 137/57 120/67  Pulse:  (!) 101 64 88  Resp:   14 (!) 22  Temp: 98.3 F (36.8 C)  98.5 F (36.9 C)   TempSrc: Oral  Oral   SpO2:   97% 99%   Supplemental O2: Room Air SpO2: 99 %   Physical Exam:  Constitutional: Alert, elderly appearing male lying in bed, in no acute distress Cardiovascular: Regular rate and rhythm Pulmonary/Chest: Normal work of breathing on room air Abdominal: Soft, nontender, nondistended MSK: normal bulk and tone Neurological: Awake and alert Skin: Large decubitus ulcer over right hip, expressed bleeding, no purulent discharge noted, no surrounding induration or erythema Psych: Normal mood   There were no vitals filed for this visit.   Intake/Output Summary (Last 24 hours) at 06/20/2023 1137 Last data filed at 06/20/2023 0341 Gross per 24 hour  Intake 300 ml  Output 120 ml  Net 180 ml   Net IO Since Admission: 280.63 mL [06/20/23 1137]  Pertinent Labs:    Latest Ref Rng & Units 06/20/2023    6:35 AM 06/18/2023    8:27 PM 06/18/2023    8:25 PM  CBC  WBC 4.0 - 10.5 K/uL 19.5   20.3   Hemoglobin 13.0 - 17.0 g/dL 16.1  09.6  04.5   Hematocrit 39.0 - 52.0 % 31.7  46.0  48.3   Platelets 150 - 400 K/uL 355   459        Latest Ref Rng & Units 06/20/2023    4:30 AM 06/18/2023    8:27 PM 06/18/2023    8:25 PM  CMP  Glucose 70 -  99 mg/dL 409  811  914   BUN 8 - 23 mg/dL 14  16  14    Creatinine 0.61 - 1.24 mg/dL 7.82  9.56  2.13   Sodium 135 - 145 mmol/L 137  133  135   Potassium 3.5 - 5.1 mmol/L 3.8  3.7  4.2   Chloride 98 - 111 mmol/L 106  112  105   CO2 22 - 32 mmol/L 21   17   Calcium 8.9 - 10.3 mg/dL 7.9   8.8   Total Protein 6.5 - 8.1 g/dL   7.9   Total Bilirubin 0.3 - 1.2 mg/dL   0.8   Alkaline Phos 38 - 126 U/L   122   AST 15 - 41 U/L   21   ALT 0 - 44 U/L   8     Imaging: No results found.  Assessment/Plan:   Principal Problem:   Decubitus ulcer of right hip, unstageable (HCC) Active Problems:   Physical deconditioning   Malnutrition of moderate degree   Paraplegia (HCC)   Patient Summary: [name] is a 67 y.o. with a pertinent PMH of HTN, paraplegia of the lower half, history of  GI bleed, malnutrition who presents with worsening right hip ulcer and admitted for evaluation for this.  Infected right decubitus ulcer On admission noted purulent discharge. Leukocytosis 19.5 today from 20.  He is afebrile and normal lactic acid on admission.  Exam today did not show any purulent drainage continue broad-spectrum antibiotics.  Consulted general surgery today for evaluation. -Consult general surgery, follow-up recs -Continue vancomycin and ceftriaxone (day 2) -Continue home gabapentin 800 mg TID -Scheduled Tylenol 1000 mg q8h  -Oxycodone 5 mg q3h PRN mod/severe -Follow-up blood cultures -Continue wound care -Offloading and frequent turning  Paraplegia Physical deconditioning He remains bedbound and unable to turn herself.  He declined further SNF and request to go home with home health services. -Pending TOC assistance  History of gastric ulcer Hemoglobin noted at 10 today. On admission yesterday it was 14.6 but appears baseline around 9-10, on 6/6 was 10.6.  Of note, he needs repeat EGD in July. -Continue Protonix 40 mg BID  HTN -Continue home losartan 25 mg daily  History of  closed left femur fracture status post IMN On this admission it was noted history of A-fib from prior discharge note but there is no record of A-fib diagnosis.  On further chart review, patient was admitted on 4/4 for closed femur fracture s/p IMN on 6/5 and was started on Eliquis 2.5 mg twice daily for DVT prophylaxis for 3 weeks.  Eliquis was held when he saw [facility] for GI bleeding later that month. No indication at this time to resume anticoagulation.   Malnutrition -Continue home mirtazapine 15 mg at bedtime -Pending RD consult, follow up recs   Diet: Normal IVF: None,None VTE: Enoxaparin Code: Full TOC recs: Pending request for home health services Family Update: No family at bedside this morning  Dispo: Anticipated discharge to Home pending medical stability and safe dispo plan.   Rana Snare, DO Internal Medicine Resident PGY-1 Pager: [phone] Please contact the on call pager after 5 pm and on weekends at [phone].

## 2023-06-20 NOTE — Progress Notes (Signed)
Patient has refused dressing change to his sacrum/bottom at this time. Dr. Benito Mccreedy is aware of this refusal.

## 2023-06-20 NOTE — Procedures (Signed)
Procedures 1.  Excisional vs non-excisional. Excisional  2.  Tool used for debridement (curette, scapel, etc.)  scissors  3.  Frequency of surgical debridement.   once  4.  Measurement of total devitalized tissue (wound surface) before and after surgical debridement.   6x3.5x4cm before 6x6.5x4cm after  5.  Area and depth of devitalized tissue removed from wound.  3x3.5x3cm removed  6.  Blood loss and description of tissue removed.  20cc blood loss, all tissue removed was necrotic  7.  Evidence of the progress of the wound's response to treatment.  A.  Current wound volume (current dimensions and depth).  6x6.5x4cm  B.  Presence (and extent of) of infection.  No  C.  Presence (and extent of) of non viable tissue.  Yes  D.  Other material in the wound that is expected to inhibit healing.   There is still some more adherent necrotic tissue at the base of the wound that will need medihoney and PT hydrotherapy  8.  Was there any viable tissue removed (measurements): No   There was some bleeding from the skin edge.  A 2-0 silk suture was used to control this.  Hemostasis was achieved.  The wound was packed with a NS WD dressing.  He tolerated the procedure well with no issues.  Letha Cape 11:38 AM 06/20/2023

## 2023-06-20 NOTE — Consult Note (Signed)
[name] 1956-12-17  [id number].    Requesting MD: Dr. Charissa Bash Chief Complaint/Reason for Consult: R hip wound  HPI:  This is a 67 yo male with a history of paraplegia for the last 2 years with multiple other medical problems who states he developed a wound on his right hip in the last 1.5 weeks.  He has been home from a SNF for the last 2 weeks after having a left hip fx requiring fixation earlier this year.  He presented to the ED last night with a WBC of 20K and this wound.  He was admitted and we have been asked to see him for further recommendations.  ROS: ROS: see HPI  History reviewed. No pertinent family history.  Past Medical History:  Diagnosis Date   Cellulitis 07/17/2020   LEFT HIP   Diabetes mellitus    Dyspnea    Hyperlipidemia    Hypertension     Past Surgical History:  Procedure Laterality Date   BIOPSY  05/01/2023   Procedure: BIOPSY;  Surgeon: Beverley Fiedler, MD;  Location: [facility] ENDOSCOPY;  Service: Gastroenterology;;   ESOPHAGOGASTRODUODENOSCOPY (EGD) WITH PROPOFOL N/A 05/01/2023   Procedure: ESOPHAGOGASTRODUODENOSCOPY (EGD) WITH PROPOFOL;  Surgeon: Beverley Fiedler, MD;  Location: [facility] ENDOSCOPY;  Service: Gastroenterology;  Laterality: N/A;   FEMUR IM NAIL Left 03/27/2023   Procedure: INTRAMEDULLARY (IM) RETROGRADE FEMORAL NAILING;  Surgeon: Myrene Galas, MD;  Location: [facility] OR;  Service: Orthopedics;  Laterality: Left;   SPINE SURGERY      Social History:  reports that he has been smoking cigarettes. He has a 20.00 pack-year smoking history. He has never used smokeless tobacco. He reports that he does not drink alcohol and does not use drugs.  Allergies:  Allergies  Allergen Reactions   Lipitor [Atorvastatin Calcium] Other (See Comments)    Myalgias     Medications Prior to Admission  Medication Sig Dispense Refill   oxyCODONE (OXY IR/ROXICODONE) 5 MG immediate release tablet Take by mouth.     acetaminophen (TYLENOL) 325 MG tablet Take  650 mg by mouth every 6 (six) hours as needed for moderate pain, fever or headache.     gabapentin (NEURONTIN) 800 MG tablet Take 800 mg by mouth 3 (three) times daily.     HYDROcodone-acetaminophen (NORCO/VICODIN) 5-325 MG tablet Take 1 tablet by mouth every 6 (six) hours as needed. 10 tablet 0   LORazepam (ATIVAN) 0.5 MG tablet Take 1 tablet (0.5 mg total) by mouth every 8 (eight) hours as needed (nerve provlem). 20 tablet 1   losartan (COZAAR) 25 MG tablet Take 25 mg by mouth daily.     mirtazapine (REMERON SOL-TAB) 15 MG disintegrating tablet Take 1 tablet (15 mg total) by mouth at bedtime. 90 tablet 3   pantoprazole (PROTONIX) 40 MG tablet Take 1 tablet (40 mg total) by mouth 2 (two) times daily before a meal. (Patient not taking: Reported on 05/09/2023) 60 tablet 0   senna-docusate (SENOKOT-S) 8.6-50 MG tablet Take 1 tablet by mouth at bedtime. (Patient not taking: Reported on 05/09/2023)     sucralfate (CARAFATE) 1 GM/10ML suspension Take 10 mLs (1 g total) by mouth 4 (four) times daily -  with meals and at bedtime for 10 days. 420 mL 0     Physical Exam: Blood pressure 120/67, pulse 88, temperature 98.5 F (36.9 C), temperature source Oral, resp. rate (!) 22, SpO2 99 %. General: pleasant, chronically appearing male, NAD HEENT: head is normocephalic, atraumatic.  Sclera  are noninjected.  PERRL.  Ears and nose without any masses or lesions.  Mouth is pink and moist Skin: warm and dry with no masses, lesions, or rashes.  Stage IV appearing R greater trochanteric wound.  Bone is not palpable.  Overlying necrotic tissue eschar that is covering about half of the wound with the interior wasted away.  See debridement note.  Several smaller surrounding stage II type appearing wounds noted. Psych: A&Ox3 with an appropriate affect.   Results for orders placed or performed during the hospital encounter of 06/18/23 (from the past 48 hour(s))  CBC with Differential     Status: Abnormal   Collection  Time: 06/18/23  8:25 PM  Result Value Ref Range   WBC 20.3 (H) 4.0 - 10.5 K/uL   RBC 4.97 4.22 - 5.81 MIL/uL   Hemoglobin 14.6 13.0 - 17.0 g/dL   HCT 09.8 11.9 - 14.7 %   MCV 97.2 80.0 - 100.0 fL   MCH 29.4 26.0 - 34.0 pg   MCHC 30.2 30.0 - 36.0 g/dL   RDW 82.9 56.2 - 13.0 %   Platelets 459 (H) 150 - 400 K/uL   nRBC 0.0 0.0 - 0.2 %   Neutrophils Relative % 83 %   Neutro Abs 16.7 (H) 1.7 - 7.7 K/uL   Lymphocytes Relative 11 %   Lymphs Abs 2.3 0.7 - 4.0 K/uL   Monocytes Relative 4 %   Monocytes Absolute 0.8 0.1 - 1.0 K/uL   Eosinophils Relative 1 %   Eosinophils Absolute 0.2 0.0 - 0.5 K/uL   Basophils Relative 0 %   Basophils Absolute 0.1 0.0 - 0.1 K/uL   Immature Granulocytes 1 %   Abs Immature Granulocytes 0.10 (H) 0.00 - 0.07 K/uL    Comment: Performed at [facility], [address], [place], [place] [address]  Comprehensive metabolic panel     Status: Abnormal   Collection Time: 06/18/23  8:25 PM  Result Value Ref Range   Sodium 135 135 - 145 mmol/L   Potassium 4.2 3.5 - 5.1 mmol/L   Chloride 105 98 - 111 mmol/L   CO2 17 (L) 22 - 32 mmol/L   Glucose, Bld 145 (H) 70 - 99 mg/dL    Comment: Glucose reference range applies only to samples taken after fasting for at least 8 hours.   BUN 14 8 - 23 mg/dL   Creatinine, Ser 4.69 0.61 - 1.24 mg/dL   Calcium 8.8 (L) 8.9 - 10.3 mg/dL   Total Protein 7.9 6.5 - 8.1 g/dL   Albumin 2.6 (L) 3.5 - 5.0 g/dL   AST 21 15 - 41 U/L   ALT 8 0 - 44 U/L   Alkaline Phosphatase 122 38 - 126 U/L   Total Bilirubin 0.8 0.3 - 1.2 mg/dL   GFR, Estimated >62 >95 mL/min    Comment: (NOTE) Calculated using the CKD-EPI Creatinine Equation (2021)    Anion gap 13 5 - 15    Comment: Performed at [facility], [address], [place], [place] [address]  I-stat chem 8, ED (not at [facility], [facility] or [facility])     Status: Abnormal   Collection Time: 06/18/23  8:27 PM  Result Value Ref Range   Sodium 133 (L) 135 - 145 mmol/L   Potassium 3.7 3.5 - 5.1  mmol/L   Chloride 112 (H) 98 - 111 mmol/L   BUN 16 8 - 23 mg/dL   Creatinine, Ser 2.44 0.61 - 1.24 mg/dL  Glucose, Bld 153 (H) 70 - 99 mg/dL    Comment: Glucose reference range applies only to samples taken after fasting for at least 8 hours.   Calcium, Ion 0.75 (LL) 1.15 - 1.40 mmol/L   TCO2 17 (L) 22 - 32 mmol/L   Hemoglobin 15.6 13.0 - 17.0 g/dL   HCT 08.6 57.8 - 46.9 %   Comment NOTIFIED PHYSICIAN   Lactic acid     Status: None   Collection Time: 06/19/23 11:45 AM  Result Value Ref Range   Lactic Acid, Venous 1.0 0.5 - 1.9 mmol/L    Comment: Performed at [facility], [address], [place], [place] [address]  Blood culture (routine x 2)     Status: None (Preliminary result)   Collection Time: 06/19/23 11:50 AM   Specimen: BLOOD RIGHT ARM  Result Value Ref Range   Specimen Description BLOOD RIGHT ARM    Special Requests      BOTTLES DRAWN AEROBIC AND ANAEROBIC Blood Culture adequate volume   Culture      NO GROWTH < 24 HOURS Performed at [facility], [address], [place], [place] [address]    Report Status PENDING   Blood culture (routine x 2)     Status: None (Preliminary result)   Collection Time: 06/19/23  5:58 PM   Specimen: BLOOD  Result Value Ref Range   Specimen Description BLOOD SITE NOT SPECIFIED    Special Requests      BOTTLES DRAWN AEROBIC ONLY Blood Culture adequate volume   Culture      NO GROWTH < 24 HOURS Performed at [facility], [address], [place], [place] [address]    Report Status PENDING   Basic metabolic panel     Status: Abnormal   Collection Time: 06/20/23  4:30 AM  Result Value Ref Range   Sodium 137 135 - 145 mmol/L   Potassium 3.8 3.5 - 5.1 mmol/L   Chloride 106 98 - 111 mmol/L   CO2 21 (L) 22 - 32 mmol/L   Glucose, Bld 115 (H) 70 - 99 mg/dL    Comment: Glucose reference range applies only to samples taken after fasting for at least 8 hours.   BUN 14 8 - 23 mg/dL   Creatinine, Ser 2.72 0.61 - 1.24 mg/dL    Calcium 7.9 (L) 8.9 - 10.3 mg/dL   GFR, Estimated >53 >66 mL/min    Comment: (NOTE) Calculated using the CKD-EPI Creatinine Equation (2021)    Anion gap 10 5 - 15    Comment: Performed at [facility], [address] [place], [place] [address]  CBC     Status: Abnormal   Collection Time: 06/20/23  6:35 AM  Result Value Ref Range   WBC 19.5 (H) 4.0 - 10.5 K/uL   RBC 3.52 (L) 4.22 - 5.81 MIL/uL   Hemoglobin 10.0 (L) 13.0 - 17.0 g/dL   HCT 74.2 (L) 59.5 - 63.8 %   MCV 90.1 80.0 - 100.0 fL   MCH 28.4 26.0 - 34.0 pg   MCHC 31.5 30.0 - 36.0 g/dL   RDW 75.6 43.3 - 29.5 %   Platelets 355 150 - 400 K/uL   nRBC 0.0 0.0 - 0.2 %    Comment: Performed at [facility], [address], [place], [place] [address]   CT ABDOMEN PELVIS W CONTRAST  Result Date: 06/18/2023 CLINICAL DATA:  Worsening decubitus ulcer. EXAM: CT ABDOMEN AND PELVIS WITH CONTRAST  TECHNIQUE: Multidetector CT imaging of the abdomen and pelvis was performed using the standard protocol following bolus administration of intravenous contrast. RADIATION DOSE REDUCTION: This exam was performed according to the departmental dose-optimization program which includes automated exposure control, adjustment of the mA and/or kV according to patient size and/or use of iterative reconstruction technique. CONTRAST:  75mL OMNIPAQUE IOHEXOL 350 MG/ML SOLN COMPARISON:  CT abdomen pelvis dated May 05, 2023. FINDINGS: Lower chest: Trace pleural effusions, greater on the left. Subsegmental atelectasis in the posterior right lower lobe. Stable emphysematous changes and pulmonary cysts. Hepatobiliary: Multiple tiny subcentimeter low-density lesions in the liver remain too small to characterize but are unchanged and likely small cysts or hemangiomas. No new focal liver abnormality. Normal gallbladder. No biliary dilatation. Pancreas: Unremarkable. No pancreatic ductal dilatation or surrounding inflammatory changes. Spleen: Normal in size without  focal abnormality. Adrenals/Urinary Tract: Adrenal glands are unremarkable. Kidneys are normal, without renal calculi, focal lesion, or hydronephrosis. Bladder is unremarkable. Stomach/Bowel: Distended stomach. No bowel wall thickening, obstruction, or surrounding inflammatory change. Vascular/Lymphatic: Aortic atherosclerosis. IVC filter again noted. No enlarged abdominal or pelvic lymph nodes. Reproductive: Unchanged mild prostatomegaly. Other: No abdominal wall hernia or abnormality. No abdominopelvic ascites. No pneumoperitoneum. Musculoskeletal: New right posterior hip decubitus ulcer overlying the greater trochanter with associated subcutaneous emphysema. No discrete fluid collection. No underlying bony destruction or periosteal reaction. Worsened now severe anasarca. Prior ORIF of the left acetabulum and subtrochanteric left femur. Bilateral hip degenerative changes. IMPRESSION: 1. New right posterior hip decubitus ulcer overlying the greater trochanter without underlying bony changes or discrete abscess. 2. Worsened now severe anasarca. 3. Trace pleural effusions, greater on the left. 4. Aortic Atherosclerosis (ICD10-I70.0) and Emphysema (ICD10-J43.9). Electronically Signed   By: Obie Dredge M.D.   On: 06/18/2023 21:45      Assessment/Plan Stage IV R greater trochanteric wound The patient has been seen, examined, imaging, labs, and chart reviewed.  He has a wound noted on his right hip secondary to preferentially laying on this side.  Debridement performed at the bedside.  Some underlying necrotic tissue still noted at the base.  Will start dressing changes BID, medihoney, and PT hydrotherapy.  No further surgical debridement warranted.  Relayed findings to primary service.  We will sign off at this time.    I reviewed hospitalist notes, last 24 h vitals and pain scores, last 48 h intake and output, last 24 h labs and trends, and last 24 h imaging results.  Letha Cape, [facility] 06/20/2023, 11:27 AM Please see Amion for pager number during day hours 7:00am-4:30pm or 7:00am -11:30am on weekends

## 2023-06-20 NOTE — Progress Notes (Signed)
PT/Hydrotherapy Cancellation Note  Patient Details Name: [name] MRN: [id number] DOB: [birth year]   Cancelled Treatment:    Reason Eval/Treat Not Completed: Other (comment). Pt just had bedside debridement by surgery. Will initiate hydrotherapy tomorrow.   Angelina Ok [facility] 06/20/2023, 11:46 AM Skip Mayer PT Acute [employer] [phone]

## 2023-06-20 NOTE — Care Management Obs Status (Signed)
MEDICARE OBSERVATION STATUS NOTIFICATION   Patient Details  Name: [name] MRN: [id number] Date of Birth: [birth year]   Medicare Observation Status Notification Given:  Yes    Blue Winther G., RN 06/20/2023, 2:30 PM

## 2023-06-20 NOTE — Hospital Course (Addendum)
[name] is a 68 y.o. with a pertinent PMH of HTN, severe weakness of the lower extremities, history of GI bleed in the setting of gastric and duodenal ulcers, and malnutrition who presented with a worsening right hip decubitus ulcer and subsequently found to have osteomyelitis.  Osteomyelitis of the right greater trochanter 2/2 infected right decubitus ulcer On admission noted purulent discharge from ulcer.  CRP elevated at 16.6.  ESR elevated at 70.  MRI demonstrated a large decubitus ulcer posterior lateral to the right femoral greater trochanter with underlying cortical destruction of the greater trochanter and marrow changes throughout the intertrochanteric region of the right femur, consistent with osteomyelitis.  Infectious diseases was consulted and the patient was started on daptomycin and ceftriaxone for a 6 week course, ending on 07/31/23.  General surgery performed a bedside debridement of necrotic tissue.  PT hydrotherapy was done daily.  Blood cultures remained negative; patient remained afebrile; leukocytosis resolved on antibiotics.  PICC line was placed for continued IV antibiotics in the outpatient setting.  We have been getting weekly CK given risk of myositis with daptomycin.  Sarcopenia Bilateral lower extremity weakness and contractures Poor nutritional status Patient has been immobile since April secondary to severe pain and weakness in his bilateral lower extremities.  He had a prior femoral fracture s/p  IMN on 03/27/2023.  He has remained bedbound for an extensive amount of time and has consequently developed sarcopenia and contractures bilaterally.  He now experiences significant pain with movement and has been unable to complete physical therapy during admission.  He was previously in a SNF, but had negative experiences so would strongly prefer to be discharged home with home health.  PT recommended home PT if the patient is able.  The [facility] program will be evaluating him at  home and his brother is able and willing to assist him.  He has been on nutrition supplementation during admission.  He has received frequent turning and offloading.  An air mattress was ordered for home.  His home pain regimen includes Tylenol, gabapentin 800 mg 3 times daily, and Robaxin 500 mg 3 times daily.  He is taking mirtazapine 15 mg at home which should help with appetite stimulation.  Chronic Normocytic anemia History of gastric and duodenal ulcers Baseline hemoglobin is around 9-10. It has ranged from 8-10 throughout admission.  Patient has not noted any recent GI bleeding.  Has a history of gastric and duodenal ulcer from prior EGD in April.  On Protonix 40 mg twice daily at home.  Will need a repeat outpatient EGD.   Hypertension He is taking losartan 25 mg daily at home for hypertension.  Blood pressures have been within normal limits throughout his admission.

## 2023-06-20 NOTE — Progress Notes (Addendum)
Patient refused dressing changes during shift. Per patient, "they already did it this morning." Nurse attempted to reposition and turn patient in bed. Per patient, "not right now. I won't be able to see the TV. I'll turn when it's time for me to go to bed." Patient also refused blood sugar check. Provider made aware-confirmed understanding. Patient voices no concerns at this time. Bed lowered, bed alarm initiated, and call bell within reach.

## 2023-06-21 ENCOUNTER — Other Ambulatory Visit: Payer: Self-pay

## 2023-06-21 ENCOUNTER — Inpatient Hospital Stay (HOSPITAL_COMMUNITY): Payer: 59

## 2023-06-21 DIAGNOSIS — G8220 Paraplegia, unspecified: Secondary | ICD-10-CM

## 2023-06-21 DIAGNOSIS — L89219 Pressure ulcer of right hip, unspecified stage: Secondary | ICD-10-CM | POA: Diagnosis not present

## 2023-06-21 DIAGNOSIS — D649 Anemia, unspecified: Secondary | ICD-10-CM

## 2023-06-21 LAB — CBC
HCT: 28.3 % — ABNORMAL LOW (ref 39.0–52.0)
Hemoglobin: 8.9 g/dL — ABNORMAL LOW (ref 13.0–17.0)
MCH: 28.8 pg (ref 26.0–34.0)
MCHC: 31.4 g/dL (ref 30.0–36.0)
MCV: 91.6 fL (ref 80.0–100.0)
Platelets: 356 10*3/uL (ref 150–400)
RBC: 3.09 MIL/uL — ABNORMAL LOW (ref 4.22–5.81)
RDW: 14.3 % (ref 11.5–15.5)
WBC: 17.8 10*3/uL — ABNORMAL HIGH (ref 4.0–10.5)
nRBC: 0 % (ref 0.0–0.2)

## 2023-06-21 LAB — URINALYSIS, ROUTINE W REFLEX MICROSCOPIC
Bilirubin Urine: NEGATIVE
Glucose, UA: NEGATIVE mg/dL
Hgb urine dipstick: NEGATIVE
Ketones, ur: NEGATIVE mg/dL
Leukocytes,Ua: NEGATIVE
Nitrite: NEGATIVE
Protein, ur: 30 mg/dL — AB
Specific Gravity, Urine: 1.029 (ref 1.005–1.030)
pH: 5 (ref 5.0–8.0)

## 2023-06-21 LAB — BASIC METABOLIC PANEL
Anion gap: 8 (ref 5–15)
BUN: 28 mg/dL — ABNORMAL HIGH (ref 8–23)
CO2: 22 mmol/L (ref 22–32)
Calcium: 7.6 mg/dL — ABNORMAL LOW (ref 8.9–10.3)
Chloride: 105 mmol/L (ref 98–111)
Creatinine, Ser: 1 mg/dL (ref 0.61–1.24)
GFR, Estimated: >60 mL/min (ref >60)
Glucose, Bld: 184 mg/dL — ABNORMAL HIGH (ref 70–99)
Potassium: 3.9 mmol/L (ref 3.5–5.1)
Sodium: 135 mmol/L (ref 135–145)

## 2023-06-21 LAB — GLUCOSE, CAPILLARY
Glucose-Capillary: 141 mg/dL — ABNORMAL HIGH (ref 70–99)
Glucose-Capillary: 169 mg/dL — ABNORMAL HIGH (ref 70–99)
Glucose-Capillary: 124 mg/dL — ABNORMAL HIGH (ref 70–99)

## 2023-06-21 MED ORDER — HYDROMORPHONE HCL 1 MG/ML IJ SOLN
0.5000 mg | INTRAMUSCULAR | Status: DC | PRN
  Administered 2023-06-22 – 2023-06-27 (×12): 0.5 mg via INTRAVENOUS
  Filled 2023-06-21 (×13): qty 0.5

## 2023-06-21 MED ORDER — LACTATED RINGERS IV BOLUS
1000.0000 mL | Freq: Once | INTRAVENOUS | Status: AC
  Administered 2023-06-21: 1000 mL via INTRAVENOUS

## 2023-06-21 NOTE — Progress Notes (Addendum)
HD#1 Subjective:   Summary: [name] is a 67 y.o. male with PMH of HTN, paraplegia of the lower half, history of GI bleed, malnutrition  who presents with worsening right hip ulcer and admitted for evaluation for this.  Overnight Events: none  Patient states still ongoing right hip pain from the wound. Nursing is helping him offload from that right sided wound. Tylenol and oxycodone has helped but states pain around 7-8 still. Discussed the plan with the patient and answered all questions.  Objective:  Vital signs in last 24 hours: Vitals:   06/21/23 0450 06/21/23 0500 06/21/23 0726 06/21/23 1203  BP: (!) 119/58  (!) 103/54 (!) 111/49  Pulse: 89  95 95  Resp: 16  16 (!) 28  Temp: 98.2 F (36.8 C)  98.5 F (36.9 C) 98 F (36.7 C)  TempSrc:   Oral Oral  SpO2: 91%  96% 94%  Weight:  80.4 kg     Supplemental O2: Room Air SpO2: 94 %   Physical Exam:  Constitutional: Alert, elderly appearing male lying in bed, in no acute distress Cardiovascular: Regular rate and rhythm Pulmonary/Chest: Mild increase work of breathing, on room air, normal breath sounds MSK: normal bulk and tone Neurological: Awake and alert Skin: clean dressing over right decubitus ulcer, normal skin turgor  Filed Weights   06/21/23 0500  Weight: 80.4 kg     Intake/Output Summary (Last 24 hours) at 06/21/2023 1321 Last data filed at 06/21/2023 0620 Gross per 24 hour  Intake 389.82 ml  Output --  Net 389.82 ml   Net IO Since Admission: 670.45 mL [06/21/23 1321]  Pertinent Labs:    Latest Ref Rng & Units 06/21/2023    3:19 AM 06/20/2023    6:35 AM 06/18/2023    8:27 PM  CBC  WBC 4.0 - 10.5 K/uL 17.8  19.5    Hemoglobin 13.0 - 17.0 g/dL 8.9  16.1  09.6   Hematocrit 39.0 - 52.0 % 28.3  31.7  46.0   Platelets 150 - 400 K/uL 356  355         Latest Ref Rng & Units 06/21/2023    3:19 AM 06/20/2023    4:30 AM 06/18/2023    8:27 PM  CMP  Glucose 70 - 99 mg/dL 045  409  811   BUN 8 - 23  mg/dL 28  14  16    Creatinine 0.61 - 1.24 mg/dL 9.14  7.82  9.56   Sodium 135 - 145 mmol/L 135  137  133   Potassium 3.5 - 5.1 mmol/L 3.9  3.8  3.7   Chloride 98 - 111 mmol/L 105  106  112   CO2 22 - 32 mmol/L 22  21    Calcium 8.9 - 10.3 mg/dL 7.6  7.9      Imaging: No results found.  Assessment/Plan:   Principal Problem:   Decubitus ulcer of right hip, unstageable (HCC) Active Problems:   Physical deconditioning   Malnutrition of moderate degree   Paraplegia (HCC)   Patient Summary: [name] is a 67 y.o. with a pertinent PMH of HTN, paraplegia of the lower half, history of GI bleed, malnutrition who presents with worsening right hip ulcer and admitted for evaluation for this.  Infected right decubitus ulcer Remains afebrile with leukocytosis improving to 17.8 from 19.5.  General surgery evaluated him yesterday and debrided at bedside some underlying necrotic tissue, recommended wound care and PT hydrotherapy. No surgical debridement needed  at this time. Pain has not been well controlled at this time. No growth as of today for blood cultures.  -Appreciate general surgery assistance -1L LR bolus today -Continue vancomycin and ceftriaxone (day 3) -Continue home gabapentin 800 mg TID -Scheduled Tylenol 1000 mg q8h  -Oxycodone 5 mg q3h PRN moderate -Dilaudid 0.5 mg q4h PRN severe/breakthrough -Follow-up blood cultures -Continue wound care and dressing change -Offloading and frequent turning -PT hydrotherapy  Tachypnea Exam noted mild increased work of breathing. RN stated he at times felt short of breath. No cough or fever. Normal breath sounds on exam. Does not appear volume overloaded. Thought of PE since he is bedbound but he is not tachycardiac and satting well on room air. Could be concern for aspiration. He is on vancomycin and rocephin, may need to add on anaerobe coverage.  -Follow up CXR -Continue vancomycin and ceftriaxone  History of gastric  ulcer Normocytic anemia Hemoglobin decreased to 8.9 from 10 yesterday. BUN increased to 28 from 14. On admission it was 14.6 but appears baseline around 9-10, on 6/6 was 10.6. I am concerned for possible UGIB. Patient denies any vomiting. Denies any hematochezia or melena. Abnormal urine color so will check UA today for hematuria. Monitor closely if symptoms present, consider consult to GI.  -Monitor for signs of bleeding -Check UA -Trend CBC -Continue Protonix 40 mg BID -Needs repeat outpatient EGD in July  HTN -Continue home losartan 25 mg daily  History of closed left femur fracture status post IMN On this admission it was noted history of A-fib from prior discharge note but there is no record of A-fib diagnosis.  On further chart review, patient was admitted on 4/4 for closed femur fracture s/p IMN on 6/5 and was started on Eliquis 2.5 mg twice daily for DVT prophylaxis for 3 weeks.  Eliquis was held when he saw [facility] for GI bleeding later that month. No indication at this time to resume anticoagulation.   Malnutrition Appreciate RD assistance and recommendations. -Continue home mirtazapine 15 mg at bedtime -Nutrition supplement  Paraplegia Physical deconditioning He remains bedbound and unable to turn herself.  He declined further SNF and request to go home with home health services.  I am concerned about patient not having adequate assistance at home to provide care for him. Patient and the admission note have mentioned possibly [facility]ACE evaluation in July. Will need to check on that and see if [facility]ACE is actually assessing him.  -Pending TOC assistance -Reach out to brother for more info  Diet: Normal IVF: 1L LR,Bolus VTE: Enoxaparin Code: Full TOC recs: Pending request for home health services, possibly has PACE evaluation PTA Family Update: No family at bedside this morning  Dispo: Anticipated discharge to  Home vs SNF  pending medical stability and safe dispo plan.   Rana Snare, DO Internal Medicine Resident PGY-1 Pager: [phone] Please contact the on call pager after 5 pm and on weekends at [phone].

## 2023-06-21 NOTE — Progress Notes (Signed)
Physical Therapy Wound Treatment Patient Details  Name: [name] MRN: [id number] Date of Birth: 1956-12-03  Today's Date: 06/21/2023 Time: 4098-1191 Time Calculation (min): 30 min  Subjective  Subjective Assessment Subjective: Pt stating, "is the hole that big?" Patient and Family Stated Goals: For wound to heal Date of Onset:  (unknown) Prior Treatments: s/p bedside debridement 06/20/2023  Pain Score:  4/10  Wound Assessment  Pressure Injury 06/19/23 Buttocks Right;Upper Unstageable - Full thickness tissue loss in which the base of the injury is covered by slough (yellow, tan, gray, green or brown) and/or eschar (tan, brown or black) in the wound bed. (Active)  Wound Image   06/21/23 1734  Dressing Type Foam - Lift dressing to assess site every shift;Gauze (Comment);Moist to dry;Honey 06/21/23 1734  Dressing Clean, Dry, Intact;Changed 06/21/23 1734  Dressing Change Frequency Twice a day 06/21/23 1734  State of Healing Eschar 06/21/23 1734  Site / Wound Assessment Black 06/21/23 1734  % Wound base Red or Granulating 20% 06/21/23 1734  % Wound base Yellow/Fibrinous Exudate 5% 06/21/23 1734  % Wound base Black/Eschar 75% 06/21/23 1734  % Wound base Other/Granulation Tissue (Comment) 0% 06/21/23 1734  Peri-wound Assessment Intact 06/21/23 1734  Wound Length (cm) 6 cm 06/21/23 1734  Wound Width (cm) 4 cm 06/21/23 1734  Wound Depth (cm) 0.1 cm 06/21/23 1734  Wound Surface Area (cm^2) 24 cm^2 06/21/23 1734  Wound Volume (cm^3) 2.4 cm^3 06/21/23 1734  Drainage Amount Scant 06/21/23 1734  Drainage Description Serosanguineous 06/21/23 1734  Treatment Debridement (Selective);Irrigation 06/21/23 1734     Pressure Injury 06/19/23 Buttocks Right Unstageable - Full thickness tissue loss in which the base of the injury is covered by slough (yellow, tan, gray, green or brown) and/or eschar (tan, brown or black) in the wound bed. (Active)  Dressing Type Honey;Moist to dry;Foam - Lift  dressing to assess site every shift;Gauze (Comment) 06/21/23 1734  Dressing Clean, Dry, Intact;Changed 06/21/23 1734  Dressing Change Frequency Twice a day 06/21/23 1734  State of Healing Early/partial granulation 06/21/23 1734  Site / Wound Assessment Bleeding;Yellow;Red;Black 06/21/23 1734  % Wound base Red or Granulating 50% 06/21/23 1734  % Wound base Yellow/Fibrinous Exudate 30% 06/21/23 1734  % Wound base Black/Eschar 20% 06/21/23 1734  % Wound base Other/Granulation Tissue (Comment) 0% 06/21/23 1734  Peri-wound Assessment Excoriated;Pink 06/21/23 1734  Wound Length (cm) 11 cm 06/21/23 1734  Wound Width (cm) 5.5 cm 06/21/23 1734  Wound Depth (cm) 4 cm 06/21/23 1734  Wound Surface Area (cm^2) 60.5 cm^2 06/21/23 1734  Wound Volume (cm^3) 242 cm^3 06/21/23 1734  Tunneling (cm) 6 06/21/23 1734  Undermining (cm) 3-6 06/21/23 1734  Drainage Amount Moderate 06/21/23 1734  Drainage Description Serosanguineous;Odor - foul 06/21/23 1734  Treatment Debridement (Selective);Irrigation 06/21/23 1734     Selective Debridement (non-excisional) Selective Debridement (non-excisional) - Location: right buttocks proximal and distal wound Selective Debridement (non-excisional) - Tools Used: Forceps, Scalpel Selective Debridement (non-excisional) - Tissue Removed: On proximal buttocks wound, cross hatched eschar, which was adherent. On distal buttocks wound, removed necrotic tissue from base of wound and at 3 o clock. Bled easily    Wound Assessment and Plan  Wound Therapy - Assess/Plan/Recommendations Wound Therapy - Clinical Statement: Pt presents with unstageable right buttocks wounds s/p bedside debridement 06/20/2023. Will benefit from hydrotherapy to cleanse wound, remove necrotic tissue, and reduce bioburden in addition to air mattress bed and pressure relief schedule. Wound Therapy - Functional Problem List: immobility, paraplegia Factors Delaying/Impairing Wound Healing:  Immobility  Hydrotherapy Plan: Debridement, Dressing change, Patient/family education Wound Therapy - Frequency: 2X / week Wound Therapy - Follow Up Recommendations: dressing changes by RN  Wound Therapy Goals- Improve the function of patient's integumentary system by progressing the wound(s) through the phases of wound healing (inflammation - proliferation - remodeling) by: Wound Therapy Goals - Improve the function of patient's integumentary system by progressing the wound(s) through the phases of wound healing by: Decrease Necrotic Tissue to: 20 Decrease Necrotic Tissue - Progress: Goal set today Increase Granulation Tissue to: 80 Increase Granulation Tissue - Progress: Goal set today Goals/treatment plan/discharge plan were made with and agreed upon by patient/family: Yes Time For Goal Achievement: 7 days Wound Therapy - Potential for Goals: Fair  Goals will be updated until maximal potential achieved or discharge criteria met.  Discharge criteria: when goals achieved, discharge from hospital, MD decision/surgical intervention, no progress towards goals, refusal/missing three consecutive treatments without notification or medical reason.  GP     Charges PT Wound Care Charges $Wound Debridement up to 20 cm: < or equal to 20 cm $ Wound Debridement each add'l 20 sqcm: 2 $PT Hydrotherapy Visit: 1 Visit   Lillia Pauls, PT, DPT Acute Rehabilitation Services Office [phone]       Norval Morton 06/21/2023, 5:49 PM

## 2023-06-22 ENCOUNTER — Inpatient Hospital Stay (HOSPITAL_COMMUNITY): Payer: 59

## 2023-06-22 DIAGNOSIS — L89219 Pressure ulcer of right hip, unspecified stage: Secondary | ICD-10-CM | POA: Diagnosis not present

## 2023-06-22 DIAGNOSIS — I10 Essential (primary) hypertension: Secondary | ICD-10-CM

## 2023-06-22 DIAGNOSIS — E46 Unspecified protein-calorie malnutrition: Secondary | ICD-10-CM

## 2023-06-22 DIAGNOSIS — L89519 Pressure ulcer of right ankle, unspecified stage: Secondary | ICD-10-CM | POA: Diagnosis not present

## 2023-06-22 DIAGNOSIS — L89529 Pressure ulcer of left ankle, unspecified stage: Secondary | ICD-10-CM

## 2023-06-22 DIAGNOSIS — D649 Anemia, unspecified: Secondary | ICD-10-CM | POA: Diagnosis not present

## 2023-06-22 DIAGNOSIS — Z7401 Bed confinement status: Secondary | ICD-10-CM

## 2023-06-22 DIAGNOSIS — M6284 Sarcopenia: Secondary | ICD-10-CM

## 2023-06-22 LAB — BASIC METABOLIC PANEL
Anion gap: 8 (ref 5–15)
BUN: 28 mg/dL — ABNORMAL HIGH (ref 8–23)
CO2: 21 mmol/L — ABNORMAL LOW (ref 22–32)
Calcium: 7.8 mg/dL — ABNORMAL LOW (ref 8.9–10.3)
Chloride: 107 mmol/L (ref 98–111)
Creatinine, Ser: 0.8 mg/dL (ref 0.61–1.24)
GFR, Estimated: >60 mL/min (ref >60)
Glucose, Bld: 146 mg/dL — ABNORMAL HIGH (ref 70–99)
Potassium: 4.1 mmol/L (ref 3.5–5.1)
Sodium: 136 mmol/L (ref 135–145)

## 2023-06-22 LAB — CULTURE, BLOOD (ROUTINE X 2)
Special Requests: ADEQUATE
Special Requests: ADEQUATE

## 2023-06-22 LAB — CBC
HCT: 30.5 % — ABNORMAL LOW (ref 39.0–52.0)
Hemoglobin: 9.3 g/dL — ABNORMAL LOW (ref 13.0–17.0)
MCH: 28.3 pg (ref 26.0–34.0)
MCHC: 30.5 g/dL (ref 30.0–36.0)
MCV: 92.7 fL (ref 80.0–100.0)
Platelets: 346 10*3/uL (ref 150–400)
RBC: 3.29 MIL/uL — ABNORMAL LOW (ref 4.22–5.81)
RDW: 14.2 % (ref 11.5–15.5)
WBC: 17.8 10*3/uL — ABNORMAL HIGH (ref 4.0–10.5)
nRBC: 0 % (ref 0.0–0.2)

## 2023-06-22 LAB — GLUCOSE, CAPILLARY: Glucose-Capillary: 136 mg/dL — ABNORMAL HIGH (ref 70–99)

## 2023-06-22 LAB — C-REACTIVE PROTEIN: CRP: 16.6 mg/dL — ABNORMAL HIGH (ref <1.0)

## 2023-06-22 LAB — SEDIMENTATION RATE: Sed Rate: 70 mm/hr — ABNORMAL HIGH (ref 0–16)

## 2023-06-22 MED ORDER — LORAZEPAM 2 MG/ML IJ SOLN
0.5000 mg | Freq: Once | INTRAMUSCULAR | Status: AC
  Administered 2023-06-22: 0.5 mg via INTRAVENOUS
  Filled 2023-06-22: qty 1

## 2023-06-22 NOTE — TOC Initial Note (Signed)
Transition of Care [facility]) - Initial/Assessment Note    Patient Details  Name: [name] MRN: [id number] Date of Birth: 25-Mar-1956  Transition of [facility]) CM/SW Contact:    Janae Bridgeman, RN Phone Number: 06/22/2023, 4:23 PM  Clinical Narrative:                 CM met with the patient at the bedside to discuss TOC needs.  The patient lives with his brother in the home and declines SNF placement and wants to return home with home health services.  The patient states that he received poor care in the past SNF facilities and does not want to return to a nursing home for care.  The patient states that he was agreeable to PACE services and personal care services.  I called [facility] and that do not have record of patient 's referral to their agency.  Since the patient was interested in PACE - I called and placed a referral with [facility] and she will call me back to complete the referral.  The patient states that his brother moved from [place] to take care of him in the home.  Patient is no longer active with [facility] when I spoke with Tresa Endo, CM with [facility].  The patient was agreeable to be set up with another [facility] agency along with personal care services through [facility].    PACE services will not be available until August 2024 even if referral is placed and patient is accepting of the services.  DME at the home includes hospital bed, hoyer lift, wheelchair and BSC.  Patient has lower leg contractures.  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  CM will continue to follow the patient for [facility] needs to return home with home health and [facility] services - pending at this time.  Expected Discharge Plan: Home w Home Health Services Barriers to Discharge: Continued  Medical Work up   Patient Goals and CMS Choice Patient states their goals for this hospitalization and ongoing recovery are:: To go home CMS Medicare.gov Compare Post Acute Care list provided to:: Patient Choice offered to / list presented to : Patient [place] ownership interest in [facility].provided to:: Patient    Expected Discharge Plan and Services   Discharge Planning Services: CM Consult Post Acute Care Choice: Home Health Living arrangements for the past 2 months: Single Family Home                                      Prior Living Arrangements/Services Living arrangements for the past 2 months: Single Family Home Lives with:: Relatives (patient's brother lives with the patient in the patient's home) Patient language and need for interpreter reviewed:: Yes Do you feel safe going back to the place where you live?: Yes      Need for Family Participation in Patient Care: Yes (Comment) Care giver support system in place?: Yes (comment) Current home services: DME (DME at the home includes hospital bed, hoyer lift, wheelchair, [facility]) Criminal Activity/Legal Involvement Pertinent to Current Situation/Hospitalization: No - Comment as needed  Activities of Daily Living      Permission Sought/Granted Permission sought to share information with :  Case Manager, Oceanographer granted to share information with : Yes, Verbal Permission Granted     Permission granted to share info w AGENCY: Referral placed with [facility]        Emotional Assessment Appearance:: Appears stated age Attitude/Demeanor/Rapport: Gracious Affect (typically observed): Accepting Orientation: : Oriented to Self, Oriented to Place, Oriented to  Time, Oriented to Situation Alcohol / Substance Use: Tobacco Use (smokes 3 cigarettes per day) Psych Involvement: No (comment)  Admission diagnosis:  Need for assistance with personal care  [Z74.1] Decubitus ulcer of right hip, unstageable (HCC) [L89.210] Decubitus ulcer, unstageable with infection (HCC) [L89.95, L08.9] Patient Active Problem List   Diagnosis Date Noted   Decubitus ulcer of right hip, unstageable (HCC) 06/19/2023   Secondary hypertension 05/06/2023   Acute upper GI bleed 05/05/2023   Paraplegia (HCC) 05/04/2023   Protein-calorie malnutrition, severe 05/02/2023   Decubitus ulcer of sacral region, unstageable (HCC) 05/01/2023   Acute esophagitis 05/01/2023   Gastric ulcer 05/01/2023   Duodenal ulcer 05/01/2023   Acute GI bleeding 04/30/2023   Acute blood loss anemia 04/30/2023   Melena 04/30/2023   History of gastric ulcer 04/30/2023   AKI (acute kidney injury) (HCC) 03/27/2023   Traumatic rhabdomyolysis (HCC) 03/27/2023   Closed femur fracture (HCC) 03/26/2023   Malnutrition of moderate degree 09/11/2020   Loss of weight 09/07/2020   Candidal intertrigo 09/07/2020   Fall at home 09/06/2020   Hypoglycemia 07/31/2020   Purulent Cellulitis of left hip 07/15/2020   Recurrent falls 07/15/2020   Frailty syndrome in geriatric patient 07/15/2020   Physical deconditioning 07/15/2020   Asymptomatic bacteriuria 07/15/2020   Diabetes mellitus (HCC) 01/28/2012   PCP:  Pcp, No Pharmacy:   [id number] [place], [place] - [address] [place] [address] Phone: [phone] Fax: [phone]  [facility] [address] [place] [address] Phone: [phone] Fax: [phone]     Social Determinants of Health (SDOH) Social History: SDOH Screenings   Food Insecurity: No Food Insecurity (05/01/2023)  Housing: Low Risk  (05/01/2023)  Transportation Needs: No Transportation Needs (05/01/2023)  Utilities: Not At Risk (05/01/2023)  Tobacco Use: High Risk (06/20/2023)   SDOH Interventions:     Readmission Risk Interventions    06/22/2023    4:22 PM 05/04/2023    1:50 PM   Readmission Risk Prevention Plan  Transportation Screening Complete Complete  PCP or Specialist Appt within 5-7 Days  Complete  PCP or Specialist Appt within 3-5 Days Complete   Home Care Screening  Complete  Medication Review (RN CM)  Referral to Pharmacy  HRI or Home Care Consult Complete   Social Work Consult for Recovery Care Planning/Counseling Complete   Palliative Care Screening Complete   Medication Review Oceanographer) Complete

## 2023-06-22 NOTE — Progress Notes (Signed)
Unit clerk called portables and low air loss mattress is not presently available and they will call unit once one becomes available. This nurse explained to patient the importance of frequent turns and states he will do so according to his pain.

## 2023-06-22 NOTE — Progress Notes (Signed)
Secure chat with floor RN re: PICC placement order to follow up if patient still need it. Per RN she'll discontinue PICC order. Patient has working PIV.

## 2023-06-22 NOTE — Evaluation (Signed)
Physical Therapy Evaluation Patient Details Name: [name] MRN: [id number] DOB: 08/26/1956 Today's Date: 06/22/2023  History of Present Illness  Pt is a 67 y.o. male presenting 06/19/23 with  right hip decubitus ulcer and placement issue. Surgery did bedside debridement 06/20/23; Discharged to home from SNF 2 weeks PTA. PMH includes DM, deconditioning, paraplegia, bedbound since L femur fx 03/2023, malnutrition, anemia, HTN  Clinical Impression   Pt admitted secondary to problem above with deficits below. PTA patient was discharged home from SNF 2 weeks prior with brother taking care of him. Due to LLE pain, brother was unable to roll pt onto Left side and pt developed severe wounds Rt hip/buttock. Pt currently requires +2 total assist to roll and unable to attempt to sit EOB due to severity of pain. (Patient pre-medicated for pain ~2 hours prior, however pt reports it did nothing for him and pain is worse than usual because they moved him from one bed to another). Patient is adamant that he is not going to go to another SNF due to bad experiences. Only wants to discharge home. Anticipate patient will continue to fail at home as brother has not been able to provide level of care pt needs. If brother agrees to take him home, recommend HHPT (especially for educating brother on how to assist pt) and air mattress for home.  Anticipate patient may benefit from PT to address problems listed below. Will continue to follow acutely to maximize functional mobility independence and safety.          Assistance Recommended at Discharge Frequent or constant Supervision/Assistance  If plan is discharge home, recommend the following:  Can travel by private vehicle  Assist for transportation;Help with stairs or ramp for entrance;Other (comment) (lift via hoyer)   No    Equipment Recommendations Other (comment) (air mattress if goes home)  Recommendations for Other Services       Functional Status  Assessment Patient has had a recent decline in their functional status and/or demonstrates limited ability to make significant improvements in function in a reasonable and predictable amount of time     Precautions / Restrictions Precautions Precautions: Fall Precaution Comments: fell in 03/2023 with L femur fx (missed w/c during transfer) Restrictions Weight Bearing Restrictions: No      Mobility  Bed Mobility Overal bed mobility: Needs Assistance Bed Mobility: Rolling Rolling: Total assist, +2 for physical assistance         General bed mobility comments: pt cries out with attempt to roll    Transfers                        Ambulation/Gait                  Stairs            Wheelchair Mobility     Tilt Bed    Modified Rankin (Stroke Patients Only)       Balance                                             Pertinent Vitals/Pain Pain Assessment Pain Assessment: 0-10 Pain Score: 10-Worst pain ever Pain Location: LLE, tailbone, RLE (wounds) Pain Descriptors / Indicators: Constant, Grimacing, Guarding, Moaning, Sharp Pain Intervention(s): Limited activity within patient's tolerance, Monitored during session, Premedicated before session, Repositioned    Home  Living Family/patient expects to be discharged to:: Private residence Living Arrangements: Other relatives (brother) Available Help at Discharge: Family;Available PRN/intermittently Type of Home: Mobile home Home Access: Ramped entrance       Home Layout: One level Home Equipment: Agricultural consultant (2 wheels);Shower seat;Wheelchair - manual;BSC/3in1;Hospital bed;Other (comment) (hoyer lift) Additional Comments: brother has been trying to care for him since he left SNF 2 weeks ago. Has been unable to roll him to his left side due to pt's pain and pt developed rt hip/buttock wound    Prior Function Prior Level of Function : Needs assist             Mobility  Comments: has been non-ambulatory and using w/c for years; had been doing scoot/squat pivot to w/c without assist and then fell (missed chair) and broke L femur 03/2023 and has not been able to tolerate getting OOB due to LLE pain       Hand Dominance   Dominant Hand: Right    Extremity/Trunk Assessment   Upper Extremity Assessment Upper Extremity Assessment: Defer to OT evaluation    Lower Extremity Assessment Lower Extremity Assessment: RLE deficits/detail;LLE deficits/detail RLE Deficits / Details: Able to extend at hip/knee to -20 extension (20 flexion); +spasms and cries out in pain with any movement; donned Prevalon boot LLE Deficits / Details: Able to extend at hip/knee to -20 extension (20 flexion); +spasms and cries out in pain with any movement; donned Prevalon boot    Cervical / Trunk Assessment Cervical / Trunk Assessment: Kyphotic  Communication   Communication: No difficulties  Cognition Arousal/Alertness: Awake/alert Behavior During Therapy: Anxious Overall Cognitive Status: No family/caregiver present to determine baseline cognitive functioning                                 General Comments: Patient with poor insight and is refusing return to SNF. Insisting on going home with brother to care for him (which was unsuccessful x 2 weeks PTA)        General Comments General comments (skin integrity, edema, etc.): Patient pleasant with frequent "I'll do what I can" however is very limited by pain.    Exercises     Assessment/Plan    PT Assessment Patient needs continued PT services  PT Problem List Decreased strength;Decreased range of motion;Decreased activity tolerance;Decreased balance;Decreased mobility;Decreased cognition;Decreased knowledge of use of DME;Impaired sensation;Impaired tone;Pain;Decreased skin integrity       PT Treatment Interventions DME instruction;Functional mobility training;Therapeutic activities;Therapeutic  exercise;Balance training;Cognitive remediation;Patient/family education;Wheelchair mobility training    PT Goals (Current goals can be found in the Care Plan section)  Acute Rehab PT Goals Patient Stated Goal: be able to get in/out of his wheelchair again PT Goal Formulation: With patient Time For Goal Achievement: 07/06/23 Potential to Achieve Goals: Poor    Frequency Min 2X/week     Co-evaluation               AM-PAC PT "6 Clicks" Mobility  Outcome Measure Help needed turning from your back to your side while in a flat bed without using bedrails?: Total Help needed moving from lying on your back to sitting on the side of a flat bed without using bedrails?: Total Help needed moving to and from a bed to a chair (including a wheelchair)?: Total Help needed standing up from a chair using your arms (e.g., wheelchair or bedside chair)?: Total Help needed to walk in hospital room?: Total  Help needed climbing 3-5 steps with a railing? : Total 6 Click Score: 6    End of Session   Activity Tolerance: Patient limited by pain Patient left: in bed;with call bell/phone within reach Nurse Communication: Need for lift equipment PT Visit Diagnosis: Muscle weakness (generalized) (M62.81);History of falling (Z91.81);Difficulty in walking, not elsewhere classified (R26.2)    Time: 1610-9604 PT Time Calculation (min) (ACUTE ONLY): 15 min   Charges:   PT Evaluation $PT Eval Low Complexity: 1 Low   PT General Charges $$ ACUTE PT VISIT: 1 Visit          [name], PT Acute Rehabilitation Services  Office [phone]   Zena Amos 06/22/2023, 1:31 PM

## 2023-06-22 NOTE — Progress Notes (Signed)
Unit acquiring low air loss mattress for patient today, patient had previously declined and is agreeable this morning.

## 2023-06-22 NOTE — Progress Notes (Signed)
Subjective:  Patient states that he is "not doing well" this morning because he developed a headache and some abdominal pain overnight.  He says he woke up sweating. Otherwise he says he is feeling well but continues to report pain in his legs bilaterally, especially with manipulation. He says his leg pain is unchanged. He has been eating and drinking well.   Objective:  Vital signs in last 24 hours: Vitals:   06/21/23 2204 06/22/23 0440 06/22/23 0500 06/22/23 0723  BP: (!) 151/51 (!) 137/58  (!) 142/61  Pulse: (!) 107 93  78  Resp: 20 18  16   Temp: 98.8 F (37.1 C) 98.2 F (36.8 C)  97.7 F (36.5 C)  TempSrc:      SpO2: 96% 97%  100%  Weight:   83.4 kg    CBC: Hgb 9.3 up from 8.9 yesterday.  Hematocrit 30.5 up from 28.3. Leukocytes stable at 17.8.   BMP: BUN 28, creatinine 1.  Otherwise unremarkable.  Physical Exam HENT:     Head: Normocephalic and atraumatic.  Cardiovascular:     Rate and Rhythm: Normal rate and regular rhythm.     Pulses: Normal pulses.     Heart sounds: Normal heart sounds.  Pulmonary:     Effort: Pulmonary effort is normal.     Breath sounds: Normal breath sounds.  Abdominal:     General: Abdomen is flat.     Palpations: Abdomen is soft.  Musculoskeletal:     Comments: Patient is preferentially on his right side with his knees flexed.  Legs are contracted bilaterally.  Muscle wasting due to immobility.  Skin:    Findings: Lesion present.     Comments: Right hip decubitus ulcer: one large and one small cavitary ulcer status postdebridement on the right hip over the greater trochanter area.  No purulent discharge.  Some signs of wound healing.  Minor skin breakdown and surrounding area which nursing says is due to Medihoney.  Mildly tender on palpation.  Right lateral ankle decubitus ulcer: There is a quarter sized ulcer on the right lateral ankle.  No signs of infection.  Left medial ankle decubitus ulcer: There is a dime sized ulcer on the left  medial ankle.  No signs of active infection.  Neurological:     General: No focal deficit present.     Mental Status: He is alert and oriented to person, place, and time. Mental status is at baseline.     Assessment/Plan:  Principal Problem:   Decubitus ulcer of right hip, unstageable (HCC) Active Problems:   Physical deconditioning   Malnutrition of moderate degree   Paraplegia (HCC)  Patient Summary: [name] is a 67 y.o. with a pertinent PMH of HTN, sarcopenia with bilateral lower extremity contractures, history of GI bleed 2/2 gastric and duodenal ulcers, and malnutrition who presents with infected right hip decubitus ulcer and admitted for treatment of this.   Infected decubitus ulcer, right hip Decubitus ulcer on right lateral ankle Decubitus ulcer on left medial ankle Patient remained afebrile with stable leukocytosis since yesterday.  Blood cultures remain negative.  Urinalysis was unremarkable. He continues to have tenderness over the wound area, but on my exam I saw no concerning signs of purulence, discoloration, or worsening infection. He is receiving PT hydrotherapy and wound care. Nursing reports that the wound looks much better than previously.  Medihoney seems to be causing some skin breakdown so we will hold off on using it on the wounds for now.  -  Order ESR/CRP -MRI of the right hip to rule out osteomyelitis -Continue vancomycin and ceftriaxone (day 4), will determine length of course based on MRI and lab findings for osteomyelitis -Scheduled Tylenol 1000 mg q8h  -Oxycodone 5 mg q3h PRN moderate -Dilaudid 0.5 mg q4h PRN severe/breakthrough -Gabapentin 800 mg 3 times daily -Continue wound care and dressing change.  -Offloading and frequent turning -PT hydrotherapy -Switched to water bed  History of gastric and duodenal ulcers Anemia Hemoglobin increased to 9.3 from 8.9 yesterday.  No active signs of bleeding.  Baseline appears to be around 9 or 10.  Patient  denies any active bleeding.  -Trend CBC -Continue Protonix 40 mg BID -Repeat outpatient EGD in July per GI  Sarcopenia Bilateral lower extremity weakness and contractures History of close left femur fracture status post IMN Physical deconditioning Malnutrition Patient has remained bedbound since April and is unable to walk.  He is able to wiggle his toes, but unable to straighten his legs due to contractures. His brother has been helping take care of him, including changing his diapers.  Patient is not amenable to SNF and would prefer home health.  Will touch base with social work to discuss options regarding [facility] program or home health. -Order PT -Pending TOC assistance  -Continue Robaxin 500 mg 3 times daily for muscle spasm -Continue home mirtazapine 15 mg at bedtime -Nutrition supplement  HTN -Continue home losartan 25 mg daily  Diet: Normal IVF: None VTE: Enoxaparin Code: Full TOC recs: Pending request for home health services, possibly has [facility] evaluation PTA Family Update: No family at bedside this morning   Dispo: Anticipated discharge to  Home vs SNF  pending medical stability and safe dispo plan.   Annett Fabian, MD Internal Medicine Resident PGY-1  06/22/2023, 11:17 AM Pager: [phone] After 5pm on weekdays and 1pm on weekends: On Call pager [phone]

## 2023-06-23 DIAGNOSIS — M86151 Other acute osteomyelitis, right femur: Secondary | ICD-10-CM | POA: Diagnosis not present

## 2023-06-23 DIAGNOSIS — L89519 Pressure ulcer of right ankle, unspecified stage: Secondary | ICD-10-CM | POA: Diagnosis not present

## 2023-06-23 DIAGNOSIS — L89529 Pressure ulcer of left ankle, unspecified stage: Secondary | ICD-10-CM | POA: Diagnosis not present

## 2023-06-23 DIAGNOSIS — L89219 Pressure ulcer of right hip, unspecified stage: Secondary | ICD-10-CM | POA: Diagnosis not present

## 2023-06-23 LAB — BASIC METABOLIC PANEL
Anion gap: 6 (ref 5–15)
BUN: 23 mg/dL (ref 8–23)
CO2: 24 mmol/L (ref 22–32)
Calcium: 7.8 mg/dL — ABNORMAL LOW (ref 8.9–10.3)
Chloride: 106 mmol/L (ref 98–111)
Creatinine, Ser: 0.75 mg/dL (ref 0.61–1.24)
GFR, Estimated: >60 mL/min (ref >60)
Glucose, Bld: 192 mg/dL — ABNORMAL HIGH (ref 70–99)
Potassium: 3.6 mmol/L (ref 3.5–5.1)
Sodium: 136 mmol/L (ref 135–145)

## 2023-06-23 LAB — CBC
HCT: 28.5 % — ABNORMAL LOW (ref 39.0–52.0)
Hemoglobin: 8.9 g/dL — ABNORMAL LOW (ref 13.0–17.0)
MCH: 28.7 pg (ref 26.0–34.0)
MCHC: 31.2 g/dL (ref 30.0–36.0)
MCV: 91.9 fL (ref 80.0–100.0)
Platelets: 403 10*3/uL — ABNORMAL HIGH (ref 150–400)
RBC: 3.1 MIL/uL — ABNORMAL LOW (ref 4.22–5.81)
RDW: 14.3 % (ref 11.5–15.5)
WBC: 14.2 10*3/uL — ABNORMAL HIGH (ref 4.0–10.5)
nRBC: 0 % (ref 0.0–0.2)

## 2023-06-23 MED ORDER — ENSURE ENLIVE PO LIQD
237.0000 mL | Freq: Three times a day (TID) | ORAL | Status: DC
  Administered 2023-06-23 – 2023-06-29 (×13): 237 mL via ORAL

## 2023-06-23 NOTE — Progress Notes (Signed)
Subjective:  No overnight events.  Patient says he is feeling better this morning, and seems more alert and conversant than yesterday.  He says his leg pain is still a 9 out of 10, but that is improved from yesterday.  He feels like his ulcer is getting better.  He expressed some anxiety about the MRI yesterday.  We discussed the results of the MRI and inflammatory markers with him this morning.  Patient's son states he will talk to his brother and updated him about this. He was notably emotional about the diagnosis of osteomyelitis and his prospects of being able to walk again in the future.   Objective:  Vital signs in last 24 hours: Vitals:   06/22/23 0500 06/22/23 0723 06/22/23 1935 06/23/23 0445  BP:  (!) 142/61 (!) 164/78 115/61  Pulse:  78 (!) 110 98  Resp:  16 18 18   Temp:  97.7 F (36.5 C) 98.1 F (36.7 C) 98.8 F (37.1 C)  TempSrc:      SpO2:  100%  95%  Weight: 83.4 kg       Physical Exam: Constitutional: Alert, conversive Cardio: Normal rate and rhythm.  No murmurs. Pulm: Lungs clear to auscultation bilaterally Abdomen: Soft, no tenderness Skin: No new lesions or notable skin changes Neuro: Alert and oriented; no focal deficit noted Psych: Normal mood and affect; expressed some sadness about new diagnosis of osteomyelitis  Labs:  CBC: Hemoglobin 8.9, anemic but stable.  Hematocrit 28.5, down from 30.5 yesterday.  White blood cells decreased to 14.2 from 17.8 yesterday.  BMP: Glucose 192, otherwise unremarkable.  ESR: 70 CRP: 16.6  Assessment/Plan:  Principal Problem:   Decubitus ulcer of right hip, unstageable (HCC) Active Problems:   Physical deconditioning   Malnutrition of moderate degree   Paraplegia (HCC)   Patient Summary: [name] is a 67 y.o. with a pertinent PMH of HTN, sarcopenia with bilateral lower extremity contractures, history of GI bleed 2/2 gastric and duodenal ulcers, and malnutrition who presents with infected decubitus ulcer  of the right hip and was subsequently found to have osteomyelitis of the right greater trochanter.  Osteomyelitis of the right greater trochanter 2/2 Infected decubitus ulcer Decubitus ulcer on right lateral ankle Decubitus ulcer on left medial ankle Patient has remained afebrile and leukocytes have decreased from yesterday.  ESR was elevated at 70.  CRP was elevated at 16.6.  MRI of the right hip demonstrated underlying cortical destruction of the right greater trochanter with marrow changes throughout the intertrochanteric region of the right femur, suspicious for osteomyelitis.  Inflammatory changes in the right gluteus and proximal right thigh abductor musculature were also noted, consistent with myositis. Blood cultures from 6/26 still show no growth.  He is still receiving PT hydrotherapy and wound care. -Continue vancomycin and ceftriaxone (day 5); given findings of osteomyelitis will likely continue antibiotics for 6 weeks minimum -Consult infectious disease for antibiotic regimen and help with OPAT -Order PICC line for long-term antibiotics -Scheduled Tylenol 1000 mg q8h  -Oxycodone 5 mg q3h PRN moderate -Dilaudid 0.5 mg q4h PRN severe/breakthrough -Gabapentin 800 mg 3 times daily -Continue wound care and dressing changes; offloading and frequent turning  Sarcopenia Bilateral lower extremity weakness and contractures History of close left femur fracture status post IMN Physical deconditioning Malnutrition PT saw the patient yesterday, and recommended HHPT including education for the brother on how to properly assist the patient, as well as getting an air mattress for home.  Social work placed a referral  to the [facility].  Patient expressed the desire to be able to walk again, as well as his sadness about his immobility.  Social work has provided the brother with education about how to help take care of him at home. -TOC assisting; waiting for response to [facility] referral,  likely not available until August if approved; working on setting him up with home health -Continue Robaxin 500 mg 3 times daily for muscle spasms -Continue home mirtazapine 15 mg at bedtime -Nutrition supplement  History of gastric and duodenal ulcers Chronic Anemia Continues to have a stable normocytic anemia, with hemoglobin of 8.9 this morning.  Baseline seems to be 9-10. -Morning CBC -Continue Protonix 40 mg twice daily -Repeat outpatient EGD in July per GI  HTN Blood pressure within normal limits this morning at 115/61.  No acute concerns. -Continue home losartan 25 mg daily  Diet: Normal IVF: None VTE: Enoxaparin Code: Full TOC recs: Pending request for PACE and home health services Family Update: No family at bedside this morning   Dispo: Anticipated discharge to  Home with PACE/Home Health pending medical stability and safe dispo plan.   Annett Fabian, MD 06/23/2023, 5:52 AM Pager: [phone] After 5pm on weekdays and 1pm on weekends: On Call pager [phone]

## 2023-06-23 NOTE — Evaluation (Signed)
Occupational Therapy Evaluation Patient Details Name: [name] MRN: [id number] DOB: 09/07/1956 Today's Date: 06/23/2023   History of Present Illness Pt is a 67 y.o. male presenting 06/19/23 with  right hip decubitus ulcer and placement issue. Surgery did bedside debridement 06/20/23; Discharged to home from SNF 2 weeks PTA. PMH includes DM, deconditioning, paraplegia, bedbound since L femur fx 03/2023, malnutrition, anemia, HTN   Clinical Impression   Pt evaluated s/p above admission list. Pt reports requiring total A for bed level ADLs from brother at baseline. Pt presents this session with generalized weakness, significant R hip pain, and fatigue. Pt agitated upon arrival and refusing EOB/OOB activity. Pt agreeable to bed level grooming and repositioning with maximal encouragement. Pt currently requires mod-max A for UB ADLs and total A +2 for LB ADLs at bed level. Pt with no acute or follow up OT needs at this time as he is total A at baseline and not interested in follow up OT services. Pt adamantly declining skilled level of care. Pt would benefit as brother unable to provide significant level of care as needed for safety. Please reconsult if pt becomes agreeable to OT services.      Recommendations for follow up therapy are one component of a multi-disciplinary discharge planning process, led by the attending physician.  Recommendations may be updated based on patient status, additional functional criteria and insurance authorization.   Assistance Recommended at Discharge Frequent or constant Supervision/Assistance  Patient can return home with the following Assist for transportation;Help with stairs or ramp for entrance;Direct supervision/assist for financial management;Direct supervision/assist for medications management;Assistance with cooking/housework;Two people to help with bathing/dressing/bathroom;Two people to help with walking and/or transfers    Functional Status Assessment   Patient has had a recent decline in their functional status and/or demonstrates limited ability to make significant improvements in function in a reasonable and predictable amount of time  Equipment Recommendations  None recommended by OT    Recommendations for Other Services       Precautions / Restrictions Precautions Precautions: Fall Precaution Comments: fell in 03/2023 with L femur fx (missed w/c during transfer) Restrictions Weight Bearing Restrictions: No      Mobility Bed Mobility Overal bed mobility: Needs Assistance Bed Mobility: Rolling Rolling: Total assist, +2 for physical assistance         General bed mobility comments: rolled to L, limited secondary to pain    Transfers                   General transfer comment: Not assessed, pt declined EOB activity      Balance                                           ADL either performed or assessed with clinical judgement   ADL Overall ADL's : Needs assistance/impaired Eating/Feeding: Set up;Bed level   Grooming: Set up;Bed level;Oral care;Wash/dry face;Wash/dry hands   Upper Body Bathing: Bed level;Maximal assistance   Lower Body Bathing: Bed level;+2 for safety/equipment;+2 for physical assistance;Total assistance   Upper Body Dressing : Maximal assistance;Bed level   Lower Body Dressing: Total assistance;Bed level;+2 for safety/equipment;+2 for physical assistance   Toilet Transfer: Total assistance;+2 for physical assistance;+2 for safety/equipment (bed level) Toilet Transfer Details (indicate cue type and reason): simulated; bed level Toileting- Clothing Manipulation and Hygiene: Total assistance;+2 for physical assistance;+2 for safety/equipment;Bed level  Functional mobility during ADLs: Total assistance (bed level rolling) General ADL Comments: total A +2 for rolling to L side, bedbound at baseline, limited secondary to fatigue and pain     Vision Baseline  Vision/History: 0 No visual deficits Ability to See in Adequate Light: 0 Adequate Vision Assessment?: No apparent visual deficits     Perception Perception Perception Tested?: No   Praxis Praxis Praxis tested?: Not tested    Pertinent Vitals/Pain Pain Assessment Pain Assessment: Faces Faces Pain Scale: Hurts even more Pain Location: R hip Pain Descriptors / Indicators: Constant, Grimacing, Discomfort Pain Intervention(s): Limited activity within patient's tolerance, Monitored during session     Hand Dominance Right   Extremity/Trunk Assessment Upper Extremity Assessment Upper Extremity Assessment: Generalized weakness   Lower Extremity Assessment Lower Extremity Assessment: Defer to PT evaluation   Cervical / Trunk Assessment Cervical / Trunk Assessment: Kyphotic   Communication Communication Communication: No difficulties   Cognition Arousal/Alertness: Awake/alert Behavior During Therapy: Agitated Overall Cognitive Status: No family/caregiver present to determine baseline cognitive functioning                                 General Comments: Lacks insight into current deficits refusing SNF and wanting to return home with brother. Perseverating on taking a nap.     General Comments  VSS on RA, sacral wounds noted with repositioning, RN aware    Exercises     Shoulder Instructions      Home Living Family/patient expects to be discharged to:: Private residence Living Arrangements: Other relatives (brother) Available Help at Discharge: Family;Available 24 hours/day Type of Home: Mobile home Home Access: Ramped entrance     Home Layout: One level     Bathroom Shower/Tub: Tub/shower unit;Walk-in shower;Sponge bathes at baseline   Bathroom Toilet: Handicapped height     Home Equipment: Agricultural consultant (2 wheels);Shower seat;Wheelchair - manual;BSC/3in1;Hospital bed;Other (comment) (hoyer lift)   Additional Comments: brother has been trying to  care for him since he left SNF 2 weeks ago. Has been unable to roll him to his left side due to pt's pain and pt developed rt hip/buttock wound      Prior Functioning/Environment Prior Level of Function : Needs assist             Mobility Comments: has been non-ambulatory and using w/c for years; had been doing scoot/squat pivot to w/c without assist and then fell (missed chair) and broke L femur 03/2023 and has not been able to tolerate getting OOB due to LLE pain ADLs Comments: Pt receives total A at bed level from brother; wears brief for toileing.        OT Problem List: Decreased strength;Decreased range of motion;Decreased activity tolerance;Impaired balance (sitting and/or standing);Decreased safety awareness;Decreased knowledge of precautions;Pain      OT Treatment/Interventions:      OT Goals(Current goals can be found in the care plan section) Acute Rehab OT Goals Patient Stated Goal: to go to sleep OT Goal Formulation: With patient Time For Goal Achievement: 07/07/23 Potential to Achieve Goals: Poor  OT Frequency:      Co-evaluation              AM-PAC OT "6 Clicks" Daily Activity     Outcome Measure Help from another person eating meals?: A Little Help from another person taking care of personal grooming?: A Little Help from another person toileting, which includes using toliet, bedpan, or urinal?:  Total Help from another person bathing (including washing, rinsing, drying)?: Total Help from another person to put on and taking off regular upper body clothing?: A Lot Help from another person to put on and taking off regular lower body clothing?: Total 6 Click Score: 11   End of Session Equipment Utilized During Treatment: Other (comment) (none) Nurse Communication: Mobility status;Need for lift equipment  Activity Tolerance: No increased pain Patient left: in bed;with call bell/phone within reach;with bed alarm set  OT Visit Diagnosis: Muscle weakness  (generalized) (M62.81);History of falling (Z91.81);Pain                Time: 1610-9604 OT Time Calculation (min): 22 min Charges:  OT General Charges $OT Visit: 1 Visit OT Evaluation $OT Eval Low Complexity: 1 Low  Sherley Bounds, OTS Acute Rehabilitation Services Office [phone] Secure Chat Communication Preferred   Sherley Bounds 06/23/2023, 9:56 AM

## 2023-06-23 NOTE — Progress Notes (Signed)
    Durable Medical Equipment  (From admission, onward)           Start     Ordered   06/23/23 1451  For home use only DME Hospital bed  Once       Comments: Bed bound, paraplegia,  Question Answer Comment  Length of Need Lifetime   Patient has (list medical condition): 2 separate areas of Unstageable Pressure Injury sacrum; most superior 2 cm x 2 cm 50% yellow slough 50% pink and moist; distal 2 cm x 3 cm 50% yellow slough 50% pink moist, R buttock Unstageable Pressure Injury 6 cm x 9 cm x 3 cm   The above medical condition requires: Patient requires the ability to reposition frequently   Head must be elevated greater than: 30 degrees   Bed type Semi-electric   Hoyer Lift Yes   Support Surface: Low Air loss Mattress      06/23/23 1453   06/23/23 1450  For home use only DME Other see comment  Once       Comments: Patient needs a bedside table since he is bed bound and unable to leave the bedroom for meals.  Question:  Length of Need  Answer:  6 Months   06/23/23 1450

## 2023-06-23 NOTE — Care Management Important Message (Signed)
Important Message  Patient Details  Name: [name] MRN: [id number] Date of Birth: 1956-11-05   Medicare Important Message Given:  Yes     [name] 06/23/2023, 4:46 PM

## 2023-06-23 NOTE — Consult Note (Signed)
[facility]    Date of Admission:  06/18/2023     Total days of antibiotics 6               Reason for Consult: Osteomyelitis    Referring Provider: Dr. 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 General Surgery evaluated and debrided at beside with wound care recommendations. Healing the wound is likely to present quite a challenge given his current nutritional state, tobacco use, and limited resources. Discussed importance of optimizing nutrition, tobacco cessation, and frequent site off loading to even give site a chance to heal and even then may require additional interventions. Will continue with broad spectrum antibiotic coverage and suspect will need a prolonged course of antibiotics. Therapeutic drug monitoring of renal function and vancomycin levels. Remaining medical and supportive care per Internal Medicine Team.   PLAN:  Continue broad spectrum coverage with vancomycin and ceftriaxone.  Wound care per General Surgery recommendations. Optimize nutrition, site unloading and encourage tobacco cessation. Monitor cultures for bacteremia.  Disposition and remaining medical and supportive care per Internal Medicine Team.    Principal Problem:   Decubitus ulcer of right hip, unstageable (HCC) Active Problems:   Physical deconditioning   Malnutrition of moderate degree   Paraplegia (HCC)    acetaminophen  1,000 mg Oral Q8H   enoxaparin (LOVENOX) injection  40 mg Subcutaneous Q24H   feeding supplement  237 mL Oral TID BM   gabapentin  800 mg Oral TID   losartan  25 mg Oral Daily   methocarbamol  500 mg Oral TID   mirtazapine  15 mg Oral QHS   multivitamin with minerals  1 tablet Oral Daily   nutrition supplement (JUVEN)  1 packet Oral BID BM   pantoprazole  40 mg Oral BID    senna  1 tablet Oral QHS     HPI: [name] is a 67 y.o. male with previous medical history of hypertension, diabetes, tobacco use, and paraplegia presenting to the ED from home with worsening decubitus ulcer.   [name] was recently hospitalized at [facility] from 04/12/23 to 04/16/23 with acute GI bleed and then [facility] from 04/30/23 to 05/04/23 for GI bleed and was also noted to have a Stage 2 pressure injury to the sacral region and protein calorie malnutrition. Was discharged to home with support of his brother and nephew. And finally from 05/05/23 to 05/08/23 again with GI bleed and then unstageable pressure ulcer of the sacrum and discharged to skilled nursing facility. Returns from home with worsening sacral wound. Denies fevers or chills. Brother was turning him 3 times a day when [name] would call him and has limited at home care. Has noted worsening pain over the last 3 weeks.  [name] has been afebrile since admission with leukocytosis with WBC count of 20.3. Inflammatory markers elevated with CRP of 16.6 and Sed rate 70. CT abdomen/pelvis with new right posterior hip decubitus ulcer overlying the greater trochanter without underlying bony changes or abscess. MRI right hip with large decubitus ulceration with cortical destruction with marrow changes suspicious for osteomyelitis. Blood cultures from 06/19/23 have been without growth to date. General Surgery performed debridement at bedside with recommendation for medihoney and PT hydrotherapy. Currently on Day 6 of antimicrobial therapy with vancomycin and  ceftriaxone.    Review of Systems: Review of Systems  Constitutional:  Negative for chills, fever and weight loss.  Respiratory:  Negative for cough, shortness of breath and wheezing.   Cardiovascular:  Negative for chest pain and leg swelling.  Gastrointestinal:  Negative for abdominal pain, constipation, diarrhea, nausea and vomiting.  Skin:  Negative for rash.     Past  Medical History:  Diagnosis Date   Cellulitis 07/17/2020   LEFT HIP   Diabetes mellitus    Dyspnea    Hyperlipidemia    Hypertension     Social History   Tobacco Use   Smoking status: Every Day    Packs/day: 0.50    Years: 40.00    Additional pack years: 0.00    Total pack years: 20.00    Types: Cigarettes   Smokeless tobacco: Never  Vaping Use   Vaping Use: Never used  Substance Use Topics   Alcohol use: No   Drug use: No    History reviewed. No pertinent family history.  Allergies  Allergen Reactions   Lipitor [Atorvastatin Calcium] Other (See Comments)    Myalgias     OBJECTIVE: Blood pressure (!) 135/57, pulse 89, temperature 98.1 F (36.7 C), temperature source Oral, resp. rate 18, weight 83.4 kg, SpO2 97 %.  Physical Exam Constitutional:      General: [name] is not in acute distress.    Appearance: [name] is well-developed.     Comments: Lying in bed with head of bed elevated; pleasant.   Cardiovascular:     Rate and Rhythm: Normal rate and regular rhythm.     Heart sounds: Normal heart sounds.  Pulmonary:     Effort: Pulmonary effort is normal.     Breath sounds: Normal breath sounds.  Skin:    General: Skin is warm and dry.  Neurological:     Mental Status: [name] is alert.  Psychiatric:        Mood and Affect: Mood normal.     Lab Results Lab Results  Component Value Date   WBC 14.2 (H) 06/23/2023   HGB 8.9 (L) 06/23/2023   HCT 28.5 (L) 06/23/2023   MCV 91.9 06/23/2023   PLT 403 (H) 06/23/2023    Lab Results  Component Value Date   CREATININE 0.75 06/23/2023   BUN 23 06/23/2023   NA 136 06/23/2023   K 3.6 06/23/2023   CL 106 06/23/2023   CO2 24 06/23/2023    Lab Results  Component Value Date   ALT 8 06/18/2023   AST 21 06/18/2023   ALKPHOS 122 06/18/2023   BILITOT 0.8 06/18/2023     Microbiology: Recent Results (from the past 240 hour(s))  Blood culture (routine x 2)     Status: None (Preliminary result)   Collection Time: 06/19/23  11:50 AM   Specimen: BLOOD RIGHT ARM  Result Value Ref Range Status   Specimen Description BLOOD RIGHT ARM  Final   Special Requests   Final    BOTTLES DRAWN AEROBIC AND ANAEROBIC Blood Culture adequate volume   Culture   Final    NO GROWTH 4 DAYS Performed at [facility], [address] [place], [place] [address]    Report Status PENDING  Incomplete  Blood culture (routine x 2)     Status: None (Preliminary result)   Collection Time: 06/19/23  5:58 PM   Specimen: BLOOD  Result Value Ref Range Status   Specimen Description BLOOD SITE NOT SPECIFIED  Final  Special Requests   Final    BOTTLES DRAWN AEROBIC ONLY Blood Culture adequate volume   Culture   Final    NO GROWTH 4 DAYS Performed at [facility], [address] [place], [place] [address]    Report Status PENDING  Incomplete     Marcos Eke, NP [facility]  06/23/2023  4:15 PM

## 2023-06-23 NOTE — TOC Progression Note (Addendum)
Transition of Care [facility]) - Progression Note    Patient Details  Name: [name] MRN: [id number] Date of Birth: October 30, 1956  Transition of [facility]) CM/SW Contact  Janae Bridgeman, RN Phone Number: 06/23/2023, 9:49 AM  Clinical Narrative:    CM spoke with Cindie, CM with [facility] and they are unable to provide [facility] services due to staffing issues.  I called and spoke with Marylene Land, CM with [facility] and they are currently reviewing patient for possible acceptance for home health services.  PCS Services documents will be filled out today and I will have attending sign.  CM will continue to follow the patient for TOC needs.  06/23/2023 - CM spoke with attending physician and patient will need IV antibiotics for home.  I spoke with the patient and his brother, [name] by phone and the patient's brother is agreeable to provide IV antibiotics for the home - Home health RN has not been secured at this time.  I called Jeri Modena, RNCM with [facility] and she will follow up with the patient and brother tomorrow 06/24/23.  Personal Care Services document was completed and attached to patient's chart to be signed by the attending physician.  I called and spoke with [facility] and they plan to meet with the patient on Monday afternoon at the home to start services July 23, 2023 - patient and brother are aware.  I called Elnita Maxwell, RNCM with [facility] and asked if they are able to provide Home health RN.  The facility is low on staffing this week due to the holiday and will return my call regarding acceptance for services or not.  The patient has a hospital bed at home but will need an air mattress.  Patient was unsure as to which company provided the bed/mattress.  Bedside table was requested by the brother.  I will follow up with the DME company.  Orders will be placed.  CM will speak with the attending team to coordinate discharge planning.  06/23/2023 1447 - CM spoke with Elnita Maxwell,  [facility] with [facility] and the facility accepted the patient for services.  Jeri Modena, RNCM updated and she plans to meet with the patient/ brother Friday, 06/26/23 for teaching and planned discharge to home on Monday, since I was unable to find home health agency to accept until MOnday due to staffing shortages relating to the holiday weekend.  Modified home health orders placed.   I called [employer] and request air flow mattress be delivered to the home over the weekend to replace the regular hospital bed mattress that patient currently has at the home.  [employer] was unable to bill the patient for the bedside table for home.  Patient will need [facility] transport to home on Monday, 06/29/23 early in the afternoon so that [facility] can see the patient at the home for evaluation.  Patient/brother are aware.  CM will continue to follow the patient for TOC needs - D/C to home mOnday - [facility] home health.    Expected Discharge Plan: Home w Home Health Services Barriers to Discharge: Continued Medical Work up  Expected Discharge Plan and Services   Discharge Planning Services: CM Consult Post Acute Care Choice: Home Health Living arrangements for the past 2 months: Single Family Home  Social Determinants of Health (SDOH) Interventions SDOH Screenings   Food Insecurity: No Food Insecurity (05/01/2023)  Housing: Low Risk  (05/01/2023)  Transportation Needs: No Transportation Needs (05/01/2023)  Utilities: Not At Risk (05/01/2023)  Tobacco Use: High Risk (06/20/2023)    Readmission Risk Interventions    06/22/2023    4:22 PM 05/04/2023    1:50 PM  Readmission Risk Prevention Plan  Transportation Screening Complete Complete  PCP or Specialist Appt within 5-7 Days  Complete  PCP or Specialist Appt within 3-5 Days Complete   Home Care Screening  Complete  Medication Review (RN CM)  Referral to Pharmacy  HRI or Home Care Consult Complete   Social Work Consult for  Recovery Care Planning/Counseling Complete   Palliative Care Screening Complete   Medication Review [employer]) Complete

## 2023-06-23 NOTE — Progress Notes (Signed)
Nutrition Follow-up  DOCUMENTATION CODES:   Not applicable  INTERVENTION:  - Increase Ensure to TID.   - Continue Juven BID.   NUTRITION DIAGNOSIS:   Increased nutrient needs related to wound healing as evidenced by estimated needs.  GOAL:   Patient will meet greater than or equal to 90% of their needs  MONITOR:   PO intake, Supplement acceptance, Labs, Weight trends, Skin, I & O's  REASON FOR ASSESSMENT:   Consult Assessment of nutrition requirement/status  ASSESSMENT:   67 year old male with PMHx of HTN, DM, HLD, paraplegia (bedridden since hospitalized for left femur fracture 03/2023), A-fib, malnutrition, multiple recent hospitalizations, recently discharged from [facility] to home presented with worsening right hip wound and found to have new infected right decubitus ulcer.  Meds reviewed: cozaar, remeron, Juven, senokot. Labs reviewed: WDL.   The pt reports that he has been eating fairly well since admission. Pt reports that he was not eating very well PTA. Pt currently has Ensure BID ordered. Pt reports that he has been drinking all of the Ensure shakes. The pt reports that he would be open to drinking 3 per day. RD will increase the supplements to TID. The pt also reports that he is drinking Juven supplements as well. RD will continue to closely monitor PO intakes.   NUTRITION - FOCUSED PHYSICAL EXAM:  Flowsheet Row Most Recent Value  Orbital Region Moderate depletion  Upper Arm Region No depletion  Thoracic and Lumbar Region Unable to assess  Buccal Region Mild depletion  Temple Region Severe depletion  Clavicle Bone Region Moderate depletion  Clavicle and Acromion Bone Region Mild depletion  Scapular Bone Region Unable to assess  Dorsal Hand No depletion  Patellar Region Unable to assess  Anterior Thigh Region Unable to assess  Posterior Calf Region Unable to assess  Edema (RD Assessment) Mild  Hair Reviewed  Eyes Reviewed  Mouth Reviewed  Skin  Reviewed  Nails Reviewed       Diet Order:   Diet Order             Diet regular Room service appropriate? Yes; Fluid consistency: Thin  Diet effective now                   EDUCATION NEEDS:   No education needs have been identified at this time  Skin:  Skin Assessment: Skin Integrity Issues: Skin Integrity Issues:: Stage II, Unstageable Stage II: Scattered areas of Stage 2 R upper thigh Unstageable: L medial heel (3cm x 3cm), R medial foot (1cm x 1cm), R lateral ankle (3cm x 2.5cm), 2 separate areas to sacrum (superior 2cm x 2cm and distal 2cm x 3cm), R buttock (6cm x 9cm x 3cm)  Last BM:  06/18/23 per chart  Height:   Ht Readings from Last 1 Encounters:  05/28/23 5\' 10"  (1.778 m)    Weight:   Wt Readings from Last 1 Encounters:  06/22/23 83.4 kg    Ideal Body Weight:     BMI:  Body mass index is 26.38 kg/m.  Estimated Nutritional Needs:   Kcal:  2200-2400  Protein:  120-140 grams  Fluid:  >/= 2 L/day  Bethann Humble, RD, LDN, CNSC.

## 2023-06-23 NOTE — Progress Notes (Signed)
Physical Therapy Wound Treatment Patient Details  Name: [name] MRN: [id number] Date of Birth: [birth year]  Today's Date: 06/23/2023 Time: 6578-4696 Time Calculation (min): 35 min  Subjective  Subjective Assessment Subjective: Do what you need to for it to get better. Patient and Family Stated Goals: For wound to heal Date of Onset:  (unknown) Prior Treatments: s/p bedside debridement 06/20/2023  Pain Score:   Denied pain; pre-medicated for pain (pt already positioned on left side on arrival)  Wound Assessment  Pressure Injury 06/19/23 Buttocks Right;Upper Unstageable - Full thickness tissue loss in which the base of the injury is covered by slough (yellow, tan, gray, green or brown) and/or eschar (tan, brown or black) in the wound bed. (Active)  Dressing Type Foam - Lift dressing to assess site every shift;Gauze (Comment);Moist to dry;Barrier Film (skin prep) 06/23/23 0943  Dressing Clean, Dry, Intact;Changed 06/23/23 0943  Dressing Change Frequency Twice a day 06/23/23 0943  State of Healing Eschar 06/23/23 0943  Site / Wound Assessment Brown;Yellow 06/23/23 0943  % Wound base Red or Granulating 10% 06/23/23 0943  % Wound base Yellow/Fibrinous Exudate 90% 06/23/23 0943  % Wound base Black/Eschar 0% 06/23/23 0943  % Wound base Other/Granulation Tissue (Comment) 0% 06/21/23 1734  Peri-wound Assessment Intact 06/23/23 0943  Wound Length (cm) 6 cm 06/21/23 1734  Wound Width (cm) 4 cm 06/21/23 1734  Wound Depth (cm) 0.1 cm 06/21/23 1734  Wound Surface Area (cm^2) 24 cm^2 06/21/23 1734  Wound Volume (cm^3) 2.4 cm^3 06/21/23 1734  Margins Unattached edges (unapproximated) 06/23/23 0943  Drainage Amount Minimal 06/23/23 0943  Drainage Description Serosanguineous 06/23/23 0943  Treatment Debridement (Selective);Irrigation;Packing (Saline gauze) 06/23/23 0943     Pressure Injury 06/19/23 Buttocks Right Unstageable - Full thickness tissue loss in which the base of the injury is  covered by slough (yellow, tan, gray, green or brown) and/or eschar (tan, brown or black) in the wound bed. (Active)  Dressing Type Moist to dry;Foam - Lift dressing to assess site every shift;Gauze (Comment);Barrier Film (skin prep) 06/23/23 0943  Dressing Clean, Dry, Intact;Changed 06/23/23 0943  Dressing Change Frequency Twice a day 06/23/23 0943  State of Healing Early/partial granulation 06/23/23 0943  Site / Wound Assessment Bleeding;Yellow;Red;Black 06/23/23 0943  % Wound base Red or Granulating 50% 06/23/23 0943  % Wound base Yellow/Fibrinous Exudate 30% 06/23/23 0943  % Wound base Black/Eschar 20% 06/23/23 0943  % Wound base Other/Granulation Tissue (Comment) 0% 06/21/23 1734  Peri-wound Assessment Pink;Maceration 06/23/23 0943  Wound Length (cm) 11 cm 06/21/23 1734  Wound Width (cm) 5.5 cm 06/21/23 1734  Wound Depth (cm) 4 cm 06/21/23 1734  Wound Surface Area (cm^2) 60.5 cm^2 06/21/23 1734  Wound Volume (cm^3) 242 cm^3 06/21/23 1734  Tunneling (cm) 6 06/21/23 1734  Undermining (cm) 3-6 06/21/23 1734  Drainage Amount Copious 06/23/23 0943  Drainage Description Serosanguineous 06/23/23 0943  Treatment Debridement (Selective);Irrigation;Packing (Saline gauze) 06/23/23 0943      Selective Debridement (non-excisional) Selective Debridement (non-excisional) - Location: right buttocks proximal and distal wound Selective Debridement (non-excisional) - Tools Used: Forceps, Scalpel, Scissors Selective Debridement (non-excisional) - Tissue Removed: proximal wound removed yellow eschar with further yellow beneath (began bleeding); distal removed black eschar and yellow slough, necrotic tissue    Wound Assessment and Plan  Wound Therapy - Assess/Plan/Recommendations Wound Therapy - Clinical Statement: Pt presents with unstageable right buttocks wounds. Will benefit from hydrotherapy to cleanse wound, remove necrotic tissue, and reduce bioburden in addition to air mattress bed and pressure  relief  schedule. Wound Therapy - Functional Problem List: immobility, paraplegia Factors Delaying/Impairing Wound Healing: Immobility Hydrotherapy Plan: Debridement, Dressing change, Patient/family education Wound Therapy - Frequency: 2X / week Wound Therapy - Follow Up Recommendations: dressing changes by RN  Wound Therapy Goals- Improve the function of patient's integumentary system by progressing the wound(s) through the phases of wound healing (inflammation - proliferation - remodeling) by: Wound Therapy Goals - Improve the function of patient's integumentary system by progressing the wound(s) through the phases of wound healing by: Decrease Necrotic Tissue to: 20 Decrease Necrotic Tissue - Progress: Progressing toward goal Increase Granulation Tissue to: 80 Increase Granulation Tissue - Progress: Progressing toward goal Goals/treatment plan/discharge plan were made with and agreed upon by patient/family: Yes Time For Goal Achievement: 7 days Wound Therapy - Potential for Goals: Fair  Goals will be updated until maximal potential achieved or discharge criteria met.  Discharge criteria: when goals achieved, discharge from hospital, MD decision/surgical intervention, no progress towards goals, refusal/missing three consecutive treatments without notification or medical reason.  GP     Charges PT Wound Care Charges $Wound Debridement up to 20 cm: < or equal to 20 cm $ Wound Debridement each add'l 20 sqcm: 2 $PT Hydrotherapy Visit: 1 Visit      [name], PT Acute Rehabilitation Services  Office [phone]   Zena Amos 06/23/2023, 9:51 AM

## 2023-06-24 ENCOUNTER — Other Ambulatory Visit: Payer: Self-pay

## 2023-06-24 DIAGNOSIS — L89519 Pressure ulcer of right ankle, unspecified stage: Secondary | ICD-10-CM | POA: Diagnosis not present

## 2023-06-24 DIAGNOSIS — L89219 Pressure ulcer of right hip, unspecified stage: Secondary | ICD-10-CM | POA: Diagnosis not present

## 2023-06-24 DIAGNOSIS — L89210 Pressure ulcer of right hip, unstageable: Secondary | ICD-10-CM

## 2023-06-24 DIAGNOSIS — M86151 Other acute osteomyelitis, right femur: Secondary | ICD-10-CM | POA: Diagnosis not present

## 2023-06-24 DIAGNOSIS — M8618 Other acute osteomyelitis, other site: Secondary | ICD-10-CM

## 2023-06-24 DIAGNOSIS — L89529 Pressure ulcer of left ankle, unspecified stage: Secondary | ICD-10-CM | POA: Diagnosis not present

## 2023-06-24 LAB — CBC
HCT: 28.1 % — ABNORMAL LOW (ref 39.0–52.0)
Hemoglobin: 8.8 g/dL — ABNORMAL LOW (ref 13.0–17.0)
MCH: 28.5 pg (ref 26.0–34.0)
MCHC: 31.3 g/dL (ref 30.0–36.0)
MCV: 90.9 fL (ref 80.0–100.0)
Platelets: 401 10*3/uL — ABNORMAL HIGH (ref 150–400)
RBC: 3.09 MIL/uL — ABNORMAL LOW (ref 4.22–5.81)
RDW: 14.3 % (ref 11.5–15.5)
WBC: 12.5 10*3/uL — ABNORMAL HIGH (ref 4.0–10.5)
nRBC: 0 % (ref 0.0–0.2)

## 2023-06-24 LAB — BASIC METABOLIC PANEL
Anion gap: 8 (ref 5–15)
BUN: 18 mg/dL (ref 8–23)
CO2: 23 mmol/L (ref 22–32)
Calcium: 8 mg/dL — ABNORMAL LOW (ref 8.9–10.3)
Chloride: 105 mmol/L (ref 98–111)
Creatinine, Ser: 0.59 mg/dL — ABNORMAL LOW (ref 0.61–1.24)
GFR, Estimated: >60 mL/min (ref >60)
Glucose, Bld: 120 mg/dL — ABNORMAL HIGH (ref 70–99)
Potassium: 3.7 mmol/L (ref 3.5–5.1)
Sodium: 136 mmol/L (ref 135–145)

## 2023-06-24 LAB — GLUCOSE, CAPILLARY: Glucose-Capillary: 119 mg/dL — ABNORMAL HIGH (ref 70–99)

## 2023-06-24 LAB — CK: Total CK: 231 U/L (ref 49–397)

## 2023-06-24 LAB — CULTURE, BLOOD (ROUTINE X 2): Culture: NO GROWTH

## 2023-06-24 MED ORDER — CHLORHEXIDINE GLUCONATE CLOTH 2 % EX PADS
6.0000 | MEDICATED_PAD | Freq: Every day | CUTANEOUS | Status: DC
  Administered 2023-06-24 – 2023-06-29 (×6): 6 via TOPICAL

## 2023-06-24 MED ORDER — SODIUM CHLORIDE 0.9% FLUSH: 10.0000 mL | INTRAVENOUS | Status: DC | PRN

## 2023-06-24 MED ORDER — SODIUM CHLORIDE 0.9 % IV SOLN
8.0000 mg/kg | Freq: Every day | INTRAVENOUS | Status: DC
  Administered 2023-06-24 – 2023-06-29 (×6): 650 mg via INTRAVENOUS
  Filled 2023-06-24 (×6): qty 13

## 2023-06-24 NOTE — Progress Notes (Signed)
PHARMACY CONSULT NOTE FOR:  OUTPATIENT  PARENTERAL ANTIBIOTIC THERAPY (OPAT)  Indication: Decubitus ulcer/osteomyelitis  Regimen: Daptomycin 650 mg IV every 24 hours + Ceftriaxone 2 gm IV Q 24 hours  End date: 07/31/23  IV antibiotic discharge orders are pended. To discharging provider:  please sign these orders via discharge navigator,  Select New Orders & click on the button choice - Manage This Unsigned Work.     Thank you for allowing pharmacy to be a part of this patient's care.  Sharin Mons, PharmD, BCPS, BCIDP Infectious Diseases Clinical Pharmacist Phone: [phone] 06/24/2023, 8:51 AM

## 2023-06-24 NOTE — Progress Notes (Signed)
[facility]   Reason for visit: Follow up on osteomyelitis  Interval History: blood cultures remain negative, WBC 12.5, remains afebrile   Physical Exam: Constitutional:  Vitals:   06/24/23 0433 06/24/23 0727  BP: (!) 141/68 (!) 146/69  Pulse: 93 96  Resp: 18 18  Temp: 97.6 F (36.4 C) 97.7 F (36.5 C)  SpO2: 93% 99%   patient appears in NAD Respiratory: Normal respiratory effort  Review of Systems: Constitutional: negative for fevers and chills  Lab Results  Component Value Date   WBC 12.5 (H) 06/24/2023   HGB 8.8 (L) 06/24/2023   HCT 28.1 (L) 06/24/2023   MCV 90.9 06/24/2023   PLT 401 (H) 06/24/2023    Lab Results  Component Value Date   CREATININE 0.59 (L) 06/24/2023   BUN 18 06/24/2023   NA 136 06/24/2023   K 3.7 06/24/2023   CL 105 06/24/2023   CO2 23 06/24/2023    Lab Results  Component Value Date   ALT 8 06/18/2023   AST 21 06/18/2023   ALKPHOS 122 06/18/2023     Microbiology: Recent Results (from the past 240 hour(s))  Blood culture (routine x 2)     Status: None   Collection Time: 06/19/23 11:50 AM   Specimen: BLOOD RIGHT ARM  Result Value Ref Range Status   Specimen Description BLOOD RIGHT ARM  Final   Special Requests   Final    BOTTLES DRAWN AEROBIC AND ANAEROBIC Blood Culture adequate volume   Culture   Final    NO GROWTH 5 DAYS Performed at [facility], [address] [place], [place] [address]    Report Status 06/24/2023 FINAL  Final  Blood culture (routine x 2)     Status: None   Collection Time: 06/19/23  5:58 PM   Specimen: BLOOD  Result Value Ref Range Status   Specimen Description BLOOD SITE NOT SPECIFIED  Final   Special Requests   Final    BOTTLES DRAWN AEROBIC ONLY Blood Culture adequate volume   Culture   Final    NO GROWTH 5 DAYS Performed at [facility], [address] [place], [place] [address]    Report Status 06/24/2023 FINAL  Final    Impression/Plan:  1.  Osteomyelitis - MRI with cortical destruction and marrow changes throughout the femur concerning for osteomyelitis at the site of the open, deep ulcer.   Will plan on prolonged IV antibiotics with daptomcyin and ceftriaxone with hopes of curing the osteomyelitis.    2.  Decubitus ulceration - deep ulceration and he will need aggressive care including offloading, nutrition support, smoking cessation for a chance at healing this.  He will continue with wound care.  Diagnosis: Femur osteomyelitis  Culture Result: negative  Allergies  Allergen Reactions   Lipitor [Atorvastatin Calcium] Other (See Comments)    Myalgias     OPAT Orders Discharge antibiotics to be given via PICC line Discharge antibiotics: datptomycin 650 mg IV once daily + ceftriaxone 2 grams IV once daily Per pharmacy protocol yes Duration: 6 weeks End Date: 07/31/23  [facility] Care Per Protocol: yes  Home health RN for IV administration and teaching; PICC line care and labs.    Labs weekly while on IV antibiotics: _x_ CBC with differential __ BMP __x CMP _x_ CRP _x_ ESR __ Vancomycin trough __ CK  _x_ Please pull PIC at completion of IV antibiotics __ Please leave PIC in place until doctor has seen  patient or been notified  Fax weekly labs to [phone]  Clinic Follow Up Appt: 07/24/23 at 11 am with Dr. Luciana Axe

## 2023-06-24 NOTE — Progress Notes (Signed)
Peripherally Inserted Central Catheter Placement  The IV Nurse has discussed with the patient and/or persons authorized to consent for the patient, the purpose of this procedure and the potential benefits and risks involved with this procedure.  The benefits include less needle sticks, lab draws from the catheter, and the patient may be discharged home with the catheter. Risks include, but not limited to, infection, bleeding, blood clot (thrombus formation), and puncture of an artery; nerve damage and irregular heartbeat and possibility to perform a PICC exchange if needed/ordered by physician.  Alternatives to this procedure were also discussed.  Bard Power PICC patient education guide, fact sheet on infection prevention and patient information card has been provided to patient /or left at bedside.    PICC Placement Documentation  PICC Single Lumen 06/24/23 Right Basilic 44 cm 0 cm (Active)  Indication for Insertion or Continuance of Line Home intravenous therapies (PICC only) 06/24/23 1801  Exposed Catheter (cm) 0 cm 06/24/23 1801  Site Assessment Clean, Dry, Intact 06/24/23 1801  Line Status Flushed;Saline locked;Blood return noted 06/24/23 1801  Dressing Type Transparent;Securing device 06/24/23 1801  Dressing Status Antimicrobial disc in place;Clean, Dry, Intact 06/24/23 1801  Line Adjustment (NICU/IV Team Only) No 06/24/23 1801  Dressing Intervention New dressing;Antimicrobial disc changed;Other (Comment) 06/24/23 1801  Dressing Change Due 07/01/23 06/24/23 1801       Reginia Forts Albarece 06/24/2023, 6:03 PM

## 2023-06-24 NOTE — Progress Notes (Signed)
Subjective:  No overnight events.  Patient reports pain in his bilateral hips and legs, worse than yesterday.  He says it started getting bad after they changed his wound dressing this morning.  He says the medications we are giving help with the pain.  Spoke with patient with the plan for home health and pending discharge on Monday.  Objective:  Vital signs in last 24 hours: Vitals:   06/23/23 1601 06/23/23 1930 06/24/23 0031 06/24/23 0433  BP: (!) 135/57 (!) 169/76 139/67 (!) 141/68  Pulse: 89 96 98 93  Resp: 18 17 17 18   Temp:  97.6 F (36.4 C) 98.2 F (36.8 C) 97.6 F (36.4 C)  TempSrc:  Oral Oral Oral  SpO2: 97% 100% 93% 93%  Weight:       Labs:  CBC: Hemoglobin 8.8, hematocrit 21.8.  Leukocytes down to 12.5 from 14.2 yesterday.  BMP: BUN 18, creatinine 0.59.  Otherwise unremarkable.  Glucose 120.   Physical Exam: Constitutional: Patient appears to be in pain following dressing change.  Alert and conversant. Cardio: Regular rate and rhythm, no murmurs Pulm: Lungs clear to auscultation bilaterally Abdomen: Soft, nontender, nondistended Skin: No new skin lesions noted Neuro: Alert and oriented x 3 Psych: Normal mood and affect  Labs:  CBC: Hemoglobin 8.8, hematocrit 28.1.  Leukocytes down to 12.5 from 14.2.  BMP: BUN 18, hematocrit 0.59.  Unremarkable.  Assessment/Plan:  Principal Problem:   Decubitus ulcer of right hip, unstageable (HCC) Active Problems:   Physical deconditioning   Malnutrition of moderate degree   Paraplegia (HCC)   Patient Summary: [name] is a 67 y.o. with a pertinent PMH of HTN, sarcopenia with bilateral lower extremity contractures, history of GI bleed 2/2 gastric and duodenal ulcers, and malnutrition who presents with infected decubitus ulcer of the right hip and was subsequently found to have osteomyelitis of the right greater trochanter.  Osteomyelitis of the right greater trochanter 2/2 Infected decubitus ulcer Decubitus  ulcer on right lateral ankle Decubitus ulcer on left medial ankle Patient has remained afebrile and leukocytes have continued to decrease, down to 12.5 today. Blood cultures from 6/28 still show no growth.  He is still receiving PT hydrotherapy and wound care.  Infectious disease consulted; will follow their recommendations. -Continue vancomycin and ceftriaxone (day 6) long-term per ID recommendations -PICC line ordered for long-term antibiotics -Scheduled Tylenol 1000 mg q8h  -Oxycodone 5 mg q3h PRN moderate -Dilaudid 0.5 mg q4h PRN severe/breakthrough -Gabapentin 800 mg 3 times daily -Continue wound care and dressing changes; offloading and frequent turning  Sarcopenia Bilateral lower extremity weakness and contractures History of close left femur fracture status post IMN Physical deconditioning Malnutrition Patient has been approved for home health on Monday.  A new bed has been ordered that should help offloading for his wound healing. Nurse from [facility] will be coming today to help give him and his brother education. -TOC assisting; plan for discharge with home health on Monday -Waiting for response to PACE referral, likely not available until August if approved -PT recommended continuing home health PT -Continue Robaxin 500 mg 3 times daily for muscle spasms -Continue home mirtazapine 15 mg at bedtime -Nutrition supplement  History of gastric and duodenal ulcers Chronic Anemia Continues to have a stable normocytic anemia, with hemoglobin of 8.8 this morning. -Trend CBC -Continue Protonix 40 mg twice daily -Repeat outpatient EGD in July per GI  HTN Blood pressure within normal limits this morning at 115/61.  No acute concerns. -Continue home  losartan 25 mg daily  Diet: Normal IVF: None VTE: Enoxaparin Code: Full TOC recs: Approved for home health on Monday; pending request for PACE  Family Update: No family at bedside this morning   Dispo: Anticipated discharge to   Home with PACE/Home Health pending medical stability and safe dispo plan.   Walter Fabian, MD 06/24/2023, 5:52 AM Pager: [phone] After 5pm on weekdays and 1pm on weekends: On Call pager [phone]

## 2023-06-24 NOTE — Progress Notes (Signed)
PT Cancellation Note  Patient Details Name: [name] MRN: [id number] DOB: 07-11-1956   Cancelled Treatment:    Reason Eval/Treat Not Completed: (P) Pain limiting ability to participate, pt declining all mobility x2 attempts throughout day. Will check back as schedule allows to continue with PT POC.   Lenora Boys. PTA Acute Rehabilitation Services Office: [phone]    Catalina Antigua 06/24/2023, 2:38 PM

## 2023-06-24 NOTE — TOC Progression Note (Signed)
Transition of Care [facility]) - Progression Note    Patient Details  Name: [name] MRN: [id number] Date of Birth: [birth year]  Transition of Care [facility]) CM/SW Contact  Janae Bridgeman, RN Phone Number: 06/24/2023, 2:22 PM  Clinical Narrative:    CM met with the patient at the bedside to discuss Personal Care Services through [facility].  Patient does not have a preference for [facility] agency for [facility] aide.  I called and spoke with Jeanice Lim, RN with [facility] and [facility] agency was provided as choice and the patient is aware.  The patient was updated that [employer] was unable to provide a bedside table at home unless he paid out of pocket.  Patient was updated that [employer] would switch out the patient's current hospital bed mattress at home to an air mattress.  Elnita Maxwell, RNCM with [facility] was updated regarding patient's PCP through Dr. Jacqualin Combes office at Internal Medicine center.  PACE plans to meet with the patient on Monday afternoon at his home for PACE evaluation for potential admission to the program in August.  Jeri Modena, plans to reach out to the brother on Friday, 06/26/23 for teaching.  Patient is pending PICC line placement for IV antibiotics.  Medicine team is aware that discharge to home is recommended since home health provider would be unable to start services until Monday, 06/29/23 after the holiday weekend.   Expected Discharge Plan: Home w Home Health Services Barriers to Discharge: Continued Medical Work up  Expected Discharge Plan and Services   Discharge Planning Services: CM Consult Post Acute Care Choice: Home Health Living arrangements for the past 2 months: Single Family Home                                       Social Determinants of Health (SDOH) Interventions SDOH Screenings   Food Insecurity: Food Insecurity Present (06/24/2023)  Housing: Low Risk  (06/24/2023)  Transportation Needs: No Transportation Needs (06/24/2023)  Utilities: Not At  Risk (06/24/2023)  Tobacco Use: High Risk (06/20/2023)    Readmission Risk Interventions    06/22/2023    4:22 PM 05/04/2023    1:50 PM  Readmission Risk Prevention Plan  Transportation Screening Complete Complete  PCP or Specialist Appt within 5-7 Days  Complete  PCP or Specialist Appt within 3-5 Days Complete   Home Care Screening  Complete  Medication Review (RN CM)  Referral to Pharmacy  HRI or Home Care Consult Complete   Social Work Consult for Recovery Care Planning/Counseling Complete   Palliative Care Screening Complete   Medication Review Oceanographer) Complete

## 2023-06-25 ENCOUNTER — Inpatient Hospital Stay (HOSPITAL_COMMUNITY): Payer: 59

## 2023-06-25 DIAGNOSIS — M7989 Other specified soft tissue disorders: Secondary | ICD-10-CM

## 2023-06-25 DIAGNOSIS — M86151 Other acute osteomyelitis, right femur: Secondary | ICD-10-CM | POA: Diagnosis not present

## 2023-06-25 DIAGNOSIS — L89219 Pressure ulcer of right hip, unspecified stage: Secondary | ICD-10-CM | POA: Diagnosis not present

## 2023-06-25 DIAGNOSIS — L89519 Pressure ulcer of right ankle, unspecified stage: Secondary | ICD-10-CM | POA: Diagnosis not present

## 2023-06-25 DIAGNOSIS — L89529 Pressure ulcer of left ankle, unspecified stage: Secondary | ICD-10-CM | POA: Diagnosis not present

## 2023-06-25 LAB — CBC
HCT: 27.5 % — ABNORMAL LOW (ref 39.0–52.0)
Hemoglobin: 8.6 g/dL — ABNORMAL LOW (ref 13.0–17.0)
MCH: 27.9 pg (ref 26.0–34.0)
MCHC: 31.3 g/dL (ref 30.0–36.0)
MCV: 89.3 fL (ref 80.0–100.0)
Platelets: 391 10*3/uL (ref 150–400)
RBC: 3.08 MIL/uL — ABNORMAL LOW (ref 4.22–5.81)
RDW: 14.6 % (ref 11.5–15.5)
WBC: 11 10*3/uL — ABNORMAL HIGH (ref 4.0–10.5)
nRBC: 0 % (ref 0.0–0.2)

## 2023-06-25 LAB — GLUCOSE, CAPILLARY: Glucose-Capillary: 172 mg/dL — ABNORMAL HIGH (ref 70–99)

## 2023-06-25 NOTE — Progress Notes (Signed)
Right upper extremity venous duplex has been completed. Preliminary results can be found in CV Proc through chart review.   06/25/23 11:43 AM Olen Cordial RVT

## 2023-06-25 NOTE — Progress Notes (Signed)
Subjective:  No overnight events.  He says he still has leg pain, 8 out of 10, but improved from yesterday.  PICC line noted in his left arm.  His right arm appeared visibly swollen, patient did not know why.  He states he has trouble with physical therapy because he is in too much pain.  Encouraged him to try his best as it is the only way for him to regain any sort of mobility, but understand if he cannot due to his severe pain.  Objective:  Vital signs in last 24 hours: Vitals:   06/25/23 0015 06/25/23 0414 06/25/23 0738 06/25/23 1144  BP: (!) 124/58 126/67 133/64 137/60  Pulse: (!) 105 88 92 90  Resp: 18 17 (!) 21 18  Temp:  98.5 F (36.9 C) (!) 97.4 F (36.3 C) 98.2 F (36.8 C)  TempSrc:   Oral Tympanic  SpO2: 99% 98% 96% 98%  Weight:      Height:       Physical Exam: Constitutional: Patient appears comfortable, no acute distress Cardio: Regular rate and rhythm, no murmurs Pulm: Lungs clear to auscultation bilaterally Extremities: Right arm visibly swollen both proximally and distally, pulses intact. PICC in left arm.  Abdomen: Soft, nontender, nondistended Skin: No new skin lesions noted Neuro: Alert and oriented x 3 Psych: Normal mood and affect  Labs:  CBC: Hemoglobin 8.6, stable from yesterday.  White blood cells down to 11 from 12.5 yesterday.   Assessment/Plan:  Principal Problem:   Decubitus ulcer of right hip, unstageable (HCC) Active Problems:   Physical deconditioning   Malnutrition of moderate degree   Paraplegia (HCC)   Patient Summary: [name] is a 67 y.o. with a pertinent PMH of HTN, sarcopenia with bilateral lower extremity contractures, history of GI bleed 2/2 gastric and duodenal ulcers, and malnutrition who presents with infected decubitus ulcer of the right hip and was subsequently found to have osteomyelitis of the right greater trochanter.   Osteomyelitis of the right greater trochanter 2/2 Infected decubitus ulcer Decubitus ulcer  on right lateral ankle Decubitus ulcer on left medial ankle Patient has remained afebrile and leukocytes have continued to decrease, down to 11 today.  PICC line was placed yesterday.  -Continue vancomycin and ceftriaxone (day 7) long-term per ID recommendations -Scheduled Tylenol 1000 mg q8h  -Oxycodone 5 mg q3h PRN moderate -Dilaudid 0.5 mg q4h PRN severe/breakthrough -Gabapentin 800 mg 3 times daily -Continue wound care and dressing changes; offloading and frequent turning -Trend CBC  Acutely swollen right upper extremity Right upper extremity noted to be swollen both proximally and distally on the morning of 7/4.  Pulses intact.  Concern for deep vein thrombosis.  Per chart review patient missed 1 dose of prophylaxis a few days ago. -Doppler ultrasound ordered  Sarcopenia Bilateral lower extremity weakness and contractures History of close left femur fracture status post IMN Physical deconditioning Malnutrition Patient was unable to do physical therapy due to pain yesterday.  Home health has been coordinated for Monday, and PACE plans to meet with the patient on Monday at his home for potential admission to the program in August.  Patient said he was unable to do PT yesterday due to pain, encouraged him to try to participate today as it is important for him to regain his mobility. -TOC assisting; plan for discharge with home health on Monday -PT today; will set up home health PT -Continue Robaxin 500 mg 3 times daily for muscle spasms -Continue home mirtazapine 15 mg  at bedtime -Nutrition supplement  History of gastric and duodenal ulcers Chronic Anemia Continues to have a stable normocytic anemia, with hemoglobin of 8.6 this morning. -Trend CBC -Continue Protonix 40 mg twice daily -Repeat outpatient EGD in July per GI  Diet: Normal IVF: None VTE: Enoxaparin Code: Full TOC recs: Approved for home health on Monday; PACE scheduled to evaluate him at home on Monday Family  Update: No family at bedside this morning   Dispo: Anticipated discharge to  Home with PACE/Home Health pending medical stability and safe dispo plan.   Annett Fabian, MD 06/25/2023, 11:49 AM Pager: [phone] After 5pm on weekdays and 1pm on weekends: On Call pager [phone]

## 2023-06-26 DIAGNOSIS — L89529 Pressure ulcer of left ankle, unspecified stage: Secondary | ICD-10-CM | POA: Diagnosis not present

## 2023-06-26 DIAGNOSIS — M86151 Other acute osteomyelitis, right femur: Secondary | ICD-10-CM | POA: Diagnosis not present

## 2023-06-26 DIAGNOSIS — L89519 Pressure ulcer of right ankle, unspecified stage: Secondary | ICD-10-CM | POA: Diagnosis not present

## 2023-06-26 DIAGNOSIS — L89219 Pressure ulcer of right hip, unspecified stage: Secondary | ICD-10-CM | POA: Diagnosis not present

## 2023-06-26 LAB — CBC
HCT: 29.5 % — ABNORMAL LOW (ref 39.0–52.0)
Hemoglobin: 9.4 g/dL — ABNORMAL LOW (ref 13.0–17.0)
MCH: 28.7 pg (ref 26.0–34.0)
MCHC: 31.9 g/dL (ref 30.0–36.0)
MCV: 90.2 fL (ref 80.0–100.0)
Platelets: 393 10*3/uL (ref 150–400)
RBC: 3.27 MIL/uL — ABNORMAL LOW (ref 4.22–5.81)
RDW: 14.4 % (ref 11.5–15.5)
WBC: 11 10*3/uL — ABNORMAL HIGH (ref 4.0–10.5)
nRBC: 0 % (ref 0.0–0.2)

## 2023-06-26 NOTE — Progress Notes (Signed)
Physical Therapy Treatment/ Discharge Patient Details Name: [name] MRN: [id number] DOB: 03/13/1956 Today's Date: 06/26/2023   History of Present Illness Pt is a 67 y.o. male presenting 06/19/23 with  right hip decubitus ulcer and placement issue. Surgery did bedside debridement 06/20/23; Discharged to home from SNF 2 weeks PTA. PMH includes DM, deconditioning, paraplegia, bedbound since L femur fx 03/2023, malnutrition, anemia, HTN    PT Comments  Pt familiar from prior admission. Pt stating willingness to attempt rolling and moving legs however once movement initiated pt stated pain and unable to tolerate further. Pt remains adament that he will not return to SNF and that brother can provide assist at home. Pt would benefit from air mattress and aide for return home. Pt has been max +2 and total assist ever since femur fx 4/24 and despite several SNF stays has not progressed with mobility and pt in agreement that acute therapy will not change function and mobility significantly. Will sign off. Frequent assist at home recommended if pt continues to refuse long term care.     Assistance Recommended at Discharge Frequent or constant Supervision/Assistance  If plan is discharge home, recommend the following:  Can travel by private vehicle    Assist for transportation;Help with stairs or ramp for entrance;Other (comment);Two people to help with walking and/or transfers;A lot of help with walking and/or transfers   No  Equipment Recommendations   (air mattress)    Recommendations for Other Services       Precautions / Restrictions       Mobility  Bed Mobility               General bed mobility comments: pt premedicated and agreeable to rolling but once leg lifted and positioned pt stating pain and denied any further attempts at mobility. Pt in agreement that acute therapy will not greatly impact mobility or D/C plan    Transfers                   General transfer  comment: unable total +2 assist for >3 months    Ambulation/Gait                   Stairs             Wheelchair Mobility     Tilt Bed    Modified Rankin (Stroke Patients Only)       Balance                                            Cognition                                                Exercises      General Comments        Pertinent Vitals/Pain      Home Living                          Prior Function            PT Goals (current goals can now be found in the care plan section) Progress towards PT goals: Not progressing toward goals - comment    Frequency  PT Plan Discharge plan needs to be updated    Co-evaluation              AM-PAC PT "6 Clicks" Mobility   Outcome Measure  Help needed turning from your back to your side while in a flat bed without using bedrails?: Total Help needed moving from lying on your back to sitting on the side of a flat bed without using bedrails?: Total Help needed moving to and from a bed to a chair (including a wheelchair)?: Total Help needed standing up from a chair using your arms (e.g., wheelchair or bedside chair)?: Total Help needed to walk in hospital room?: Total Help needed climbing 3-5 steps with a railing? : Total 6 Click Score: 6    End of Session     Patient left: in bed;with call bell/phone within reach Nurse Communication: Need for lift equipment       Time: 1245-1254 PT Time Calculation (min) (ACUTE ONLY): 9 min  Charges:  no charge    PT General Charges $$ ACUTE PT VISIT: 1 Visit                     [name], PT Acute Rehabilitation Services Office: [phone]    [name] 06/26/2023, 1:09 PM

## 2023-06-26 NOTE — Progress Notes (Addendum)
   Subjective:  No overnight events.  Patient reports pain in his right arm.  An Ace bandage is wrapped to provide compression.  Patient is elevating his arm on his leg.  He does not have any new concerns.   Objective:  Vital signs in last 24 hours: Vitals:   06/25/23 1926 06/25/23 2027 06/26/23 0401 06/26/23 0757  BP: (!) 144/77  (!) 142/66 (!) 146/74  Pulse: 97 (!) 101 93 95  Resp: 18  18 16   Temp: 98.3 F (36.8 C)  98.3 F (36.8 C) 98.1 F (36.7 C)  TempSrc:    Oral  SpO2: 98% 98% 98% 98%  Weight:      Height:       Physical Exam: Constitutional: Patient is laying in bed comfortably Cardio: Regular rate and rhythm, no murmurs Pulm: Lungs clear to auscultation bilaterally; mildly tachypneic Extremities: Right arm swollen and tender to palpation; wrapped with an Ace bandage and elevated on his hip.  Labs:  CBC: White blood cells 11, Hemoglobin 9.4 up from 8.6 yesterday.  Assessment/Plan:  Principal Problem:   Decubitus ulcer of right hip, unstageable (HCC) Active Problems:   Physical deconditioning   Malnutrition of moderate degree   Paraplegia [facility])   Patient Summary:  67 year old person with severe bilateral lower extremity weakness and malnutrition who is admitted for a right lateral hip decubitus ulcer complicated by osteomyelitis of the right greater trochanter.  Osteomyelitis of the right greater trochanter Right lateral hip decubitus ulcer Dressings continue to be changed daily.  No new changes or increased pain in the hip.  Leukocytes stable at 11. - Daptomycin and ceftriaxone daily through 07/31/23. - Wound hydrotherapy daily; continue dressing changes, offloading and frequent turning - Weekly CK while on daptomycin  Acutely swollen right upper extremity Doppler ultrasound was done on 7/4 and showed no signs of DVT.  -Compression with Ace bandage right arm -Encourage elevation to reduce swelling  Sarcopenia Bilateral lower extremity weakness and  contractures Malnutrition -Work with PT today -Continue Robaxin, Tylenol as needed -Nutrition supplementation  History of gastric and duodenal ulcers Chronic Anemia Stable normocytic anemia, hemoglobin 9.4.  No concerns of active bleeding. -Protonix 40 mg twice daily  Diet: Normal IVF: None VTE: Enoxaparin Code: Full TOC recs: Approved for home health on Monday; [facility] scheduled to evaluate him at home on Monday Family Update: Patient said he will update his brother   Dispo: Anticipated discharge to  Home with [facility]/Home Health pending medical stability and safe dispo plan.   Annett Fabian, MD 06/26/2023, 9:58 AM Pager: [phone] After 5pm on weekdays and 1pm on weekends: On Call pager [phone]

## 2023-06-26 NOTE — TOC Progression Note (Signed)
Transition of Care [facility]) - Progression Note    Patient Details  Name: NAUN COMMERFORD MRN: [id number] Date of Birth: 10/13/1956  Transition of Care [facility] CM/SW Contact  Janae Bridgeman, RN Phone Number: 06/26/2023, 2:43 PM  Clinical Narrative:    CM met with the patient and brother at the bedside and Jeri Modena, [facility] plans to meet with the brother today before 3 pm to teach IV antibx teaching and infusion technique through patient's Left arm PICC line.  The patient's brother is aware that patient will discharge home on Monday by [facility]AR.  I called [employer] and they plan to deliver the air-mattress to the home on Saturday and will reach out to the brother to coordinate.  [facility] will be providing a HH RN for IV antibiotics.  The patient's brother was provided with contact for [employer] thrift store to purchase a bedside table if they have one since [employer] unable to provide.    Expected Discharge Plan: Home w Home Health Services Barriers to Discharge: Continued Medical Work up  Expected Discharge Plan and Services   Discharge Planning Services: CM Consult Post Acute Care Choice: Home Health Living arrangements for the past 2 months: Single Family Home                                       Social Determinants of Health (SDOH) Interventions SDOH Screenings   Food Insecurity: Food Insecurity Present (06/24/2023)  Housing: Low Risk  (06/24/2023)  Transportation Needs: No Transportation Needs (06/24/2023)  Utilities: Not At Risk (06/24/2023)  Tobacco Use: High Risk (06/20/2023)    Readmission Risk Interventions    06/22/2023    4:22 PM 05/04/2023    1:50 PM  Readmission Risk Prevention Plan  Transportation Screening Complete Complete  PCP or Specialist Appt within 5-7 Days  Complete  PCP or Specialist Appt within 3-5 Days Complete   Home Care Screening  Complete  Medication Review (RN CM)  Referral to Pharmacy  HRI or Home Care Consult Complete   Social Work  Consult for Recovery Care Planning/Counseling Complete   Palliative Care Screening Complete   Medication Review [employer]) Complete

## 2023-06-26 NOTE — Progress Notes (Signed)
Physical Therapy Wound Treatment Patient Details  Name: [name] MRN: [id number] Date of Birth: 1956-06-15  Today's Date: 06/26/2023 Time: 4098-1191 Time Calculation (min): 32 min  Subjective  Subjective Assessment Subjective: Do what you need to for it to get better. Patient and Family Stated Goals: For wound to heal Date of Onset:  (unknown) Prior Treatments: s/p bedside debridement 06/20/2023  Pain Score:  Premedicated. Pain is in back and LE's with positioning.  Wound Assessment  Pressure Injury 04/30/23 Hip Anterior;Left;Proximal Stage 2 -  Partial thickness loss of dermis presenting as a shallow open injury with a red, pink wound bed without slough. close to R hip (Active)        Pressure Injury 06/19/23 Buttocks Right;Upper Unstageable - Full thickness tissue loss in which the base of the injury is covered by slough (yellow, tan, gray, green or brown) and/or eschar (tan, brown or black) in the wound bed. (Active)  Dressing Type Foam - Lift dressing to assess site every shift;Gauze (Comment);Moist to dry;Barrier Film (skin prep);Alginate 06/26/23 1056  Dressing Clean, Dry, Intact;Changed 06/26/23 1056  Dressing Change Frequency Twice a day 06/26/23 1056  State of Healing Eschar 06/26/23 1056  Site / Wound Assessment Brown;Yellow 06/26/23 1056  % Wound base Red or Granulating 10% 06/26/23 1056  % Wound base Yellow/Fibrinous Exudate 90% 06/26/23 1056  % Wound base Black/Eschar 0% 06/26/23 1056  % Wound base Other/Granulation Tissue (Comment) 0% 06/26/23 1056  Peri-wound Assessment Intact 06/26/23 1056  Wound Length (cm) 6 cm 06/21/23 1734  Wound Width (cm) 4 cm 06/21/23 1734  Wound Depth (cm) 0.1 cm 06/21/23 1734  Wound Surface Area (cm^2) 24 cm^2 06/21/23 1734  Wound Volume (cm^3) 2.4 cm^3 06/21/23 1734  Margins Unattached edges (unapproximated) 06/26/23 1056  Drainage Amount Moderate 06/26/23 1056  Drainage Description Serous 06/26/23 1056  Treatment Debridement  (Selective);Irrigation 06/26/23 1056     Pressure Injury 06/19/23 Buttocks Right Unstageable - Full thickness tissue loss in which the base of the injury is covered by slough (yellow, tan, gray, green or brown) and/or eschar (tan, brown or black) in the wound bed. (Active)  Dressing Type Foam - Lift dressing to assess site every shift;Gauze (Comment);Barrier Film (skin prep);Alginate;Moist to dry 06/26/23 1056  Dressing Clean, Dry, Intact;Changed 06/26/23 1056  Dressing Change Frequency Twice a day 06/26/23 1056  State of Healing Early/partial granulation 06/26/23 1056  Site / Wound Assessment Yellow;Red;Black 06/26/23 1056  % Wound base Red or Granulating 50% 06/26/23 1056  % Wound base Yellow/Fibrinous Exudate 30% 06/26/23 1056  % Wound base Black/Eschar 20% 06/26/23 1056  % Wound base Other/Granulation Tissue (Comment) 0% 06/26/23 1056  Peri-wound Assessment Pink 06/26/23 1056  Wound Length (cm) 11 cm 06/21/23 1734  Wound Width (cm) 5.5 cm 06/21/23 1734  Wound Depth (cm) 4 cm 06/21/23 1734  Wound Surface Area (cm^2) 60.5 cm^2 06/21/23 1734  Wound Volume (cm^3) 242 cm^3 06/21/23 1734  Tunneling (cm) 6 06/21/23 1734  Undermining (cm) 3-6 06/21/23 1734  Margins Unattached edges (unapproximated) 06/26/23 1056  Drainage Amount Moderate 06/26/23 1056  Drainage Description Purulent 06/26/23 1056  Treatment Debridement (Selective);Irrigation;Packing (Saline gauze) 06/26/23 1056      Selective Debridement (non-excisional) Selective Debridement (non-excisional) - Location: right buttocks proximal and distal wound Selective Debridement (non-excisional) - Tools Used: Forceps, Scalpel Selective Debridement (non-excisional) - Tissue Removed: Black and yellow necrotic tissue from distance wound with opening of pus pocket at 3 o'clock; Removed minimal yellow necrotic tissue from proximal wound    Wound  Assessment and Plan  Wound Therapy - Assess/Plan/Recommendations Wound Therapy - Clinical  Statement: Progressing with removal of necrotic tissue from rt hip wounds. Distal wound with pus pocket at 3 oclock that opened with debridement. Continue with hydrotherapy to cleanse, remove necrotic tissue and reduce bioburden of wound. Wound Therapy - Functional Problem List: immobility, paraplegia Factors Delaying/Impairing Wound Healing: Immobility Hydrotherapy Plan: Debridement, Dressing change, Patient/family education Wound Therapy - Frequency: 2X / week Wound Therapy - Follow Up Recommendations: dressing changes by RN  Wound Therapy Goals- Improve the function of patient's integumentary system by progressing the wound(s) through the phases of wound healing (inflammation - proliferation - remodeling) by: Wound Therapy Goals - Improve the function of patient's integumentary system by progressing the wound(s) through the phases of wound healing by: Decrease Necrotic Tissue to: 20 Decrease Necrotic Tissue - Progress: Progressing toward goal Increase Granulation Tissue to: 80 Increase Granulation Tissue - Progress: Progressing toward goal  Goals will be updated until maximal potential achieved or discharge criteria met.  Discharge criteria: when goals achieved, discharge from hospital, MD decision/surgical intervention, no progress towards goals, refusal/missing three consecutive treatments without notification or medical reason.  GP     Charges PT Wound Care Charges $Wound Debridement up to 20 cm: < or equal to 20 cm $ Wound Debridement each add'l 20 sqcm: 2 $PT Hydrotherapy Visit: 1 Visit       Angelina Ok [facility] 06/26/2023, 11:04 AM Skip Mayer PT Acute Rehabilitation Services Office [phone]

## 2023-06-27 DIAGNOSIS — L89219 Pressure ulcer of right hip, unspecified stage: Secondary | ICD-10-CM | POA: Diagnosis not present

## 2023-06-27 DIAGNOSIS — L89529 Pressure ulcer of left ankle, unspecified stage: Secondary | ICD-10-CM | POA: Diagnosis not present

## 2023-06-27 DIAGNOSIS — L89519 Pressure ulcer of right ankle, unspecified stage: Secondary | ICD-10-CM | POA: Diagnosis not present

## 2023-06-27 DIAGNOSIS — M86151 Other acute osteomyelitis, right femur: Secondary | ICD-10-CM | POA: Diagnosis not present

## 2023-06-27 LAB — GLUCOSE, CAPILLARY: Glucose-Capillary: 108 mg/dL — ABNORMAL HIGH (ref 70–99)

## 2023-06-27 NOTE — Progress Notes (Signed)
HD#8 SUBJECTIVE:  Patient Summary: [name] is a 67 y.o. with a pertinent PMH of hypertension, malnutrition, afib, and severe deconditioning (has been bedridden since April), who presented with worsening right hip pain/wound and admitted for a decubitus ulcer on the right lateral thigh complicated by osteomyelitis of the right femur at the greater trochanter.   Overnight Events: No acute events overnight   Interim History: [name] was evaluated on the bedside. He was having some neck and back pain, and appeared to be uncomfortable in the bed. LPN assisted me with adjusting the patient's position to make him more comfortable.   OBJECTIVE:  Vital Signs: Vitals:   06/26/23 0757 06/26/23 1548 06/26/23 1909 06/27/23 0406  BP: (!) 146/74 (!) 151/58 (!) 159/71 135/79  Pulse: 95 92 94 98  Resp: 16 16 18 15   Temp: 98.1 F (36.7 C) 97.7 F (36.5 C) 98 F (36.7 C) 98.3 F (36.8 C)  TempSrc: Oral  Oral Axillary  SpO2: 98% 100% 98% 99%  Weight:      Height:       Supplemental O2: Room Air SpO2: 99 %  Filed Weights   06/21/23 0500 06/22/23 0500 06/24/23 0853  Weight: 80.4 kg 83.4 kg 83.4 kg     Intake/Output Summary (Last 24 hours) at 06/27/2023 0547 Last data filed at 06/26/2023 2000 Gross per 24 hour  Intake 226 ml  Output 800 ml  Net -574 ml   Net IO Since Admission: -182.55 mL [06/27/23 0547]  Physical Exam: General: Chronically ill appearing male laying in bed, appears uncomfortable Pulmonary: Normal effort Extremities: Right arm wrapped in ace bandage, warm and well perfused. R hip dressing in place. Lower extremities contracted.  Neuro: No focal deficit. Psych: Normal mood and affect     ASSESSMENT/PLAN:  Assessment: Principal Problem:   Decubitus ulcer of right hip, unstageable (HCC) Active Problems:   Physical deconditioning   Malnutrition of moderate degree   Paraplegia (HCC)   Plan: Decubitus ulcer of right lateral thigh c/b osteomyelitis of  right greater trochanter Continue with daily hydrotherapy and daily dressing changes. No need to check labs daily, as CBC has been stable - will check weekly CK, CBC w/ diff, CMP, ESR, and CRP while on IV antibiotics.  - IV Daptomycin and ceftriaxone through 8/9 - Pain control regimen: Tylenol 1000 mg q8h, oxycodone 5 mg q3h PRN, gabapentin 800 mg TID, robaxin 500 mg TID - Miralax daily PRN + Senokot daily at bedtime while on oxycodone  Right upper extremity swelling No evident of DVT. Swelling is well controlled with ACE bandage/wrap. Continue to elevate arm.   Sarcopenia Bilateral lower extremity weakness and contractures Poor nutrition status The patient has severe lower extremity weakness and has been bedbound since April. This makes the patient very high risk for worsening outcomes after discharge, as it will be difficult for his wounds to properly heal without offloading. He has been recommended to go to SNF, however, he has declined. He notes that his brother can help him at home. -PT/OT as able - Continue nutrition supplementation - Mirtazapine 15 mg daily to stimulate appetite  Best Practice: Diet: Regular diet IVF: Fluids: none VTE: enoxaparin (LOVENOX) injection 40 mg Start: 06/19/23 1200 Code: Full AB: Vanc, dapto Therapy Recs: SNF (planning for home health) DISPO: Anticipated discharge in 2 days to Home pending  home health arrangements .  Signature: Elza Rafter, D.O.  Internal Medicine Resident, PGY-3 [facility]  Pager: [phone] 5:47  AM, 06/27/2023   Please contact the on call pager after 5 pm and on weekends at [phone].

## 2023-06-27 NOTE — Plan of Care (Signed)
Patient [name], irritable.  VSS throughout shift, generalized weakness.  All meds given on time as ordered.  Pt c/o pain relieved by PRN oxycodone and IV dilaudid.  Purewick in place.  Pt refused dressing change this shift yelling and cursing at nurse stating he didn't want to be bothered, he wanted to sleep.  Pt educated on dressing change but pt still yelled and is agitated with staff.  Charge nurse made aware.  POC maintained, will continue to monitor.  Problem: Education: Goal: Knowledge of General Education information will improve Description: Including pain rating scale, medication(s)/side effects and non-pharmacologic comfort measures Outcome: Progressing   Problem: Health Behavior/Discharge Planning: Goal: Ability to manage health-related needs will improve Outcome: Progressing   Problem: Clinical Measurements: Goal: Ability to maintain clinical measurements within normal limits will improve Outcome: Progressing Goal: Will remain free from infection Outcome: Progressing Goal: Diagnostic test results will improve Outcome: Progressing Goal: Respiratory complications will improve Outcome: Progressing Goal: Cardiovascular complication will be avoided Outcome: Progressing   Problem: Activity: Goal: Risk for activity intolerance will decrease Outcome: Progressing   Problem: Nutrition: Goal: Adequate nutrition will be maintained Outcome: Progressing   Problem: Coping: Goal: Level of anxiety will decrease Outcome: Progressing   Problem: Elimination: Goal: Will not experience complications related to bowel motility Outcome: Progressing Goal: Will not experience complications related to urinary retention Outcome: Progressing   Problem: Pain Managment: Goal: General experience of comfort will improve Outcome: Progressing   Problem: Safety: Goal: Ability to remain free from injury will improve Outcome: Progressing   Problem: Skin Integrity: Goal: Risk for impaired skin  integrity will decrease Outcome: Progressing

## 2023-06-28 DIAGNOSIS — M86151 Other acute osteomyelitis, right femur: Secondary | ICD-10-CM | POA: Diagnosis not present

## 2023-06-28 DIAGNOSIS — L89219 Pressure ulcer of right hip, unspecified stage: Secondary | ICD-10-CM | POA: Diagnosis not present

## 2023-06-28 DIAGNOSIS — L89529 Pressure ulcer of left ankle, unspecified stage: Secondary | ICD-10-CM | POA: Diagnosis not present

## 2023-06-28 DIAGNOSIS — L89519 Pressure ulcer of right ankle, unspecified stage: Secondary | ICD-10-CM | POA: Diagnosis not present

## 2023-06-28 LAB — GLUCOSE, CAPILLARY
Glucose-Capillary: 107 mg/dL — ABNORMAL HIGH (ref 70–99)
Glucose-Capillary: 143 mg/dL — ABNORMAL HIGH (ref 70–99)
Glucose-Capillary: 177 mg/dL — ABNORMAL HIGH (ref 70–99)

## 2023-06-28 NOTE — Plan of Care (Addendum)
Patient [name], irritable. VSS throughout shift, generalized weakness. All meds given on time as ordered. Pt c/o pain relieved by PRN oxycodone.  Purewick in place. Sacrum dressing change done this morning.  POC maintained, will continue to monitor.   Problem: Education: Goal: Knowledge of General Education information will improve Description: Including pain rating scale, medication(s)/side effects and non-pharmacologic comfort measures Outcome: Progressing   Problem: Health Behavior/Discharge Planning: Goal: Ability to manage health-related needs will improve Outcome: Progressing   Problem: Clinical Measurements: Goal: Ability to maintain clinical measurements within normal limits will improve Outcome: Progressing Goal: Will remain free from infection Outcome: Progressing Goal: Diagnostic test results will improve Outcome: Progressing Goal: Respiratory complications will improve Outcome: Progressing Goal: Cardiovascular complication will be avoided Outcome: Progressing   Problem: Activity: Goal: Risk for activity intolerance will decrease Outcome: Progressing   Problem: Nutrition: Goal: Adequate nutrition will be maintained Outcome: Progressing   Problem: Coping: Goal: Level of anxiety will decrease Outcome: Progressing   Problem: Elimination: Goal: Will not experience complications related to bowel motility Outcome: Progressing Goal: Will not experience complications related to urinary retention Outcome: Progressing   Problem: Pain Managment: Goal: General experience of comfort will improve Outcome: Progressing   Problem: Safety: Goal: Ability to remain free from injury will improve Outcome: Progressing   Problem: Skin Integrity: Goal: Risk for impaired skin integrity will decrease Outcome: Progressing

## 2023-06-28 NOTE — Progress Notes (Signed)
   Subjective: No acute events overnight. Patient says his leg pain is a 9/10, unchanged from yesterday. Says they changed his wound dressing this morning and told him it looked like it was healing well. R arm still hurts but no numbness or tingling.  No other concerns.  Objective:  Vital signs in last 24 hours: Vitals:   06/27/23 1541 06/27/23 1938 06/28/23 0446 06/28/23 0726  BP: (!) 153/67 134/75 130/66 (!) 148/69  Pulse: (!) 101 (!) 104 92 93  Resp: 18 17 18 18   Temp: 98.9 F (37.2 C) 98.3 F (36.8 C) 97.8 F (36.6 C) 97.9 F (36.6 C)  TempSrc: Oral Oral Oral   SpO2: 98% 98% 96% 95%  Weight:      Height:       Physical Exam: Constitutional: Laying in bed, some discomfort noted Cardiac: Regular rate and rhythm, no murmurs Skin: PICC line in the left arm; ACE bandage wrapped around right arm to reduce swelling; swelling appears unchanged.  Distal pulses in right arm strong. Neuro: Alert and oriented x 3  Labs: None  Assessment/Plan:  Principal Problem:   Decubitus ulcer of right hip, unstageable (HCC) Active Problems:   Physical deconditioning   Malnutrition of moderate degree   Paraplegia (HCC)   Patient Summary: [name] is a 67 yo male with a past medical history of hypertension, gastic/duodenal ulcers, malnutrition, and severe deconditioning, who was admitted with a right hip decubitus ulcer and subsequently found to have osteomyelitis of the right greater trochanter.  Plan:  Osteomyelitis of the right greater trochanter 2/2 infected decubitus ulcer of the right lateral hip Patient remains afebrile and continues to have daily hydrotherapy and dressing changes by nursing.  He will be receiving daptomycin and ceftriaxone through a PICC line on outpatient basis until early August.  -Daptomycin and ceftriaxone through 07/31/2023 -Daily hydrotherapy and dressing changes -Pain control regimen: Tylenol 1000 mg q8h, oxycodone 5 mg q3h PRN, gabapentin 800 mg TID,  robaxin 500 mg TID  -CK on Monday; weekly CBC w/ diff, CMP, ESR/CRP  Right upper extremity swelling Doppler negative for DVT.  Wrapped in ACE bandage.  Pain unchanged this morning, no neurological symptoms.  -Continue to encourage elevation to reduce swelling.  Sarcopenia Bilateral lower extremity weakness and contractures Poor nutrition status Patient has remained immobile since April due to severe pain and weakness in his bilateral lower extremities, secondary to a prior hip fracture and femoral fracture.  He has been unable to participate in PT due to his pain.  PT has signed off. Patient does not want to go to a SNF/rehab, which may be more beneficial for his wound healing and mobility status, due to prior negative experiences.  Home health has been set up for him for Monday discharge and the [facility] will come and evaluate him at home.  His brother is also able to assist him at home. -Nutrition supplementation -Frequent offloading/turning -Mirtazapine 15 mg daily for appetite stimulation  Diet: Regular IVF: None VTE: Enoxaparin Code: Full TOC recs: SNF (planning for d/c with home health on Monday)   Dispo: Anticipated discharge to  Home on 7/8 with home health.   Walter Fabian, MD 06/28/2023, 8:34 AM Pager: [phone] After 5pm on weekdays and 1pm on weekends: On Call pager [phone]

## 2023-06-29 ENCOUNTER — Other Ambulatory Visit: Payer: Self-pay

## 2023-06-29 ENCOUNTER — Other Ambulatory Visit (HOSPITAL_COMMUNITY): Payer: Self-pay

## 2023-06-29 ENCOUNTER — Encounter (HOSPITAL_COMMUNITY): Payer: Self-pay

## 2023-06-29 ENCOUNTER — Inpatient Hospital Stay (HOSPITAL_COMMUNITY)
Admission: EM | Admit: 2023-06-29 | Discharge: 2023-07-28 | DRG: 637 | Disposition: A | Discharge status: Skilled Nursing Facility | Payer: 59 | Attending: Internal Medicine | Admitting: Internal Medicine

## 2023-06-29 DIAGNOSIS — E7800 Pure hypercholesterolemia, unspecified: Secondary | ICD-10-CM | POA: Diagnosis present

## 2023-06-29 DIAGNOSIS — I10 Essential (primary) hypertension: Secondary | ICD-10-CM | POA: Diagnosis present

## 2023-06-29 DIAGNOSIS — M6284 Sarcopenia: Secondary | ICD-10-CM | POA: Diagnosis present

## 2023-06-29 DIAGNOSIS — E1169 Type 2 diabetes mellitus with other specified complication: Secondary | ICD-10-CM | POA: Diagnosis not present

## 2023-06-29 DIAGNOSIS — J90 Pleural effusion, not elsewhere classified: Secondary | ICD-10-CM | POA: Diagnosis not present

## 2023-06-29 DIAGNOSIS — L089 Local infection of the skin and subcutaneous tissue, unspecified: Secondary | ICD-10-CM | POA: Diagnosis present

## 2023-06-29 DIAGNOSIS — M62838 Other muscle spasm: Secondary | ICD-10-CM | POA: Diagnosis not present

## 2023-06-29 DIAGNOSIS — L89219 Pressure ulcer of right hip, unspecified stage: Secondary | ICD-10-CM | POA: Diagnosis not present

## 2023-06-29 DIAGNOSIS — G8929 Other chronic pain: Secondary | ICD-10-CM | POA: Diagnosis present

## 2023-06-29 DIAGNOSIS — L89529 Pressure ulcer of left ankle, unspecified stage: Secondary | ICD-10-CM | POA: Diagnosis not present

## 2023-06-29 DIAGNOSIS — Z7401 Bed confinement status: Secondary | ICD-10-CM

## 2023-06-29 DIAGNOSIS — J690 Pneumonitis due to inhalation of food and vomit: Secondary | ICD-10-CM | POA: Diagnosis not present

## 2023-06-29 DIAGNOSIS — B3781 Candidal esophagitis: Secondary | ICD-10-CM | POA: Diagnosis not present

## 2023-06-29 DIAGNOSIS — L89519 Pressure ulcer of right ankle, unspecified stage: Secondary | ICD-10-CM | POA: Diagnosis not present

## 2023-06-29 DIAGNOSIS — G9341 Metabolic encephalopathy: Secondary | ICD-10-CM | POA: Diagnosis present

## 2023-06-29 DIAGNOSIS — F419 Anxiety disorder, unspecified: Secondary | ICD-10-CM | POA: Diagnosis present

## 2023-06-29 DIAGNOSIS — J439 Emphysema, unspecified: Secondary | ICD-10-CM | POA: Diagnosis present

## 2023-06-29 DIAGNOSIS — Z888 Allergy status to other drugs, medicaments and biological substances status: Secondary | ICD-10-CM

## 2023-06-29 DIAGNOSIS — M86151 Other acute osteomyelitis, right femur: Secondary | ICD-10-CM | POA: Diagnosis not present

## 2023-06-29 DIAGNOSIS — F17210 Nicotine dependence, cigarettes, uncomplicated: Secondary | ICD-10-CM | POA: Diagnosis present

## 2023-06-29 DIAGNOSIS — R64 Cachexia: Secondary | ICD-10-CM | POA: Diagnosis present

## 2023-06-29 DIAGNOSIS — J189 Pneumonia, unspecified organism: Secondary | ICD-10-CM

## 2023-06-29 DIAGNOSIS — E43 Unspecified severe protein-calorie malnutrition: Secondary | ICD-10-CM | POA: Diagnosis present

## 2023-06-29 DIAGNOSIS — Z4682 Encounter for fitting and adjustment of non-vascular catheter: Secondary | ICD-10-CM

## 2023-06-29 DIAGNOSIS — Z608 Other problems related to social environment: Secondary | ICD-10-CM | POA: Diagnosis present

## 2023-06-29 DIAGNOSIS — L8990 Pressure ulcer of unspecified site, unspecified stage: Secondary | ICD-10-CM | POA: Diagnosis not present

## 2023-06-29 DIAGNOSIS — Z7189 Other specified counseling: Secondary | ICD-10-CM

## 2023-06-29 DIAGNOSIS — J9601 Acute respiratory failure with hypoxia: Secondary | ICD-10-CM | POA: Diagnosis not present

## 2023-06-29 DIAGNOSIS — J869 Pyothorax without fistula: Secondary | ICD-10-CM | POA: Diagnosis not present

## 2023-06-29 DIAGNOSIS — J918 Pleural effusion in other conditions classified elsewhere: Secondary | ICD-10-CM | POA: Diagnosis not present

## 2023-06-29 DIAGNOSIS — B370 Candidal stomatitis: Secondary | ICD-10-CM | POA: Diagnosis not present

## 2023-06-29 DIAGNOSIS — M869 Osteomyelitis, unspecified: Secondary | ICD-10-CM | POA: Diagnosis present

## 2023-06-29 DIAGNOSIS — E119 Type 2 diabetes mellitus without complications: Secondary | ICD-10-CM

## 2023-06-29 DIAGNOSIS — Z66 Do not resuscitate: Secondary | ICD-10-CM | POA: Diagnosis not present

## 2023-06-29 DIAGNOSIS — Z79899 Other long term (current) drug therapy: Secondary | ICD-10-CM

## 2023-06-29 DIAGNOSIS — D638 Anemia in other chronic diseases classified elsewhere: Secondary | ICD-10-CM | POA: Diagnosis present

## 2023-06-29 DIAGNOSIS — R627 Adult failure to thrive: Secondary | ICD-10-CM | POA: Diagnosis present

## 2023-06-29 DIAGNOSIS — E1165 Type 2 diabetes mellitus with hyperglycemia: Secondary | ICD-10-CM | POA: Diagnosis present

## 2023-06-29 DIAGNOSIS — G8220 Paraplegia, unspecified: Secondary | ICD-10-CM | POA: Diagnosis present

## 2023-06-29 DIAGNOSIS — Z515 Encounter for palliative care: Secondary | ICD-10-CM

## 2023-06-29 DIAGNOSIS — L89113 Pressure ulcer of right upper back, stage 3: Secondary | ICD-10-CM | POA: Diagnosis present

## 2023-06-29 DIAGNOSIS — J15212 Pneumonia due to Methicillin resistant Staphylococcus aureus: Secondary | ICD-10-CM | POA: Diagnosis not present

## 2023-06-29 LAB — BASIC METABOLIC PANEL
Anion gap: 12 (ref 5–15)
BUN: 18 mg/dL (ref 8–23)
CO2: 18 mmol/L — ABNORMAL LOW (ref 22–32)
Calcium: 8.5 mg/dL — ABNORMAL LOW (ref 8.9–10.3)
Chloride: 107 mmol/L (ref 98–111)
Creatinine, Ser: 0.65 mg/dL (ref 0.61–1.24)
GFR, Estimated: >60 mL/min (ref >60)
Glucose, Bld: 109 mg/dL — ABNORMAL HIGH (ref 70–99)
Potassium: 4.7 mmol/L (ref 3.5–5.1)
Sodium: 137 mmol/L (ref 135–145)

## 2023-06-29 LAB — COMPREHENSIVE METABOLIC PANEL
ALT: 25 U/L (ref 0–44)
AST: 25 U/L (ref 15–41)
Albumin: 1.7 g/dL — ABNORMAL LOW (ref 3.5–5.0)
Alkaline Phosphatase: 64 U/L (ref 38–126)
Anion gap: 8 (ref 5–15)
BUN: 22 mg/dL (ref 8–23)
CO2: 26 mmol/L (ref 22–32)
Calcium: 8.5 mg/dL — ABNORMAL LOW (ref 8.9–10.3)
Chloride: 105 mmol/L (ref 98–111)
Creatinine, Ser: 0.65 mg/dL (ref 0.61–1.24)
GFR, Estimated: >60 mL/min (ref >60)
Glucose, Bld: 171 mg/dL — ABNORMAL HIGH (ref 70–99)
Potassium: 3.7 mmol/L (ref 3.5–5.1)
Sodium: 139 mmol/L (ref 135–145)
Total Bilirubin: 0.4 mg/dL (ref 0.3–1.2)
Total Protein: 5.5 g/dL — ABNORMAL LOW (ref 6.5–8.1)

## 2023-06-29 LAB — CBC WITH DIFFERENTIAL/PLATELET
Abs Immature Granulocytes: 0.04 10*3/uL (ref 0.00–0.07)
Basophils Absolute: 0.1 10*3/uL (ref 0.0–0.1)
Basophils Relative: 1 %
Eosinophils Absolute: 0.2 10*3/uL (ref 0.0–0.5)
Eosinophils Relative: 3 %
HCT: 26.6 % — ABNORMAL LOW (ref 39.0–52.0)
Hemoglobin: 8.3 g/dL — ABNORMAL LOW (ref 13.0–17.0)
Immature Granulocytes: 1 %
Lymphocytes Relative: 22 %
Lymphs Abs: 1.7 10*3/uL (ref 0.7–4.0)
MCH: 28.4 pg (ref 26.0–34.0)
MCHC: 31.2 g/dL (ref 30.0–36.0)
MCV: 91.1 fL (ref 80.0–100.0)
Monocytes Absolute: 0.7 10*3/uL (ref 0.1–1.0)
Monocytes Relative: 10 %
Neutro Abs: 4.8 10*3/uL (ref 1.7–7.7)
Neutrophils Relative %: 63 %
Platelets: 438 10*3/uL — ABNORMAL HIGH (ref 150–400)
RBC: 2.92 MIL/uL — ABNORMAL LOW (ref 4.22–5.81)
RDW: 14.9 % (ref 11.5–15.5)
WBC: 7.6 10*3/uL (ref 4.0–10.5)
nRBC: 0 % (ref 0.0–0.2)

## 2023-06-29 LAB — CK: Total CK: 80 U/L (ref 49–397)

## 2023-06-29 LAB — GLUCOSE, CAPILLARY: Glucose-Capillary: 126 mg/dL — ABNORMAL HIGH (ref 70–99)

## 2023-06-29 LAB — C-REACTIVE PROTEIN: CRP: 9.9 mg/dL — ABNORMAL HIGH (ref <1.0)

## 2023-06-29 LAB — SEDIMENTATION RATE: Sed Rate: 89 mm/hr — ABNORMAL HIGH (ref 0–16)

## 2023-06-29 MED ORDER — ADULT MULTIVITAMIN W/MINERALS CH
1.0000 | ORAL_TABLET | Freq: Every day | ORAL | Status: DC
  Administered 2023-06-30 – 2023-07-04 (×5): 1 via ORAL
  Filled 2023-06-29 (×5): qty 1

## 2023-06-29 MED ORDER — JUVEN PO PACK: 1.0000 | PACK | Freq: Two times a day (BID) | ORAL | 0 refills | Status: AC

## 2023-06-29 MED ORDER — OXYCODONE HCL 5 MG PO TABS
5.0000 mg | ORAL_TABLET | ORAL | Status: DC | PRN
  Administered 2023-06-29 – 2023-07-04 (×13): 5 mg via ORAL
  Filled 2023-06-29 (×14): qty 1

## 2023-06-29 MED ORDER — SODIUM CHLORIDE 0.9 % IV SOLN
8.0000 mg/kg | Freq: Every day | INTRAVENOUS | Status: DC
  Administered 2023-06-30 – 2023-07-03 (×4): 650 mg via INTRAVENOUS
  Filled 2023-06-29 (×5): qty 13

## 2023-06-29 MED ORDER — ACETAMINOPHEN 500 MG PO TABS: 1000.0000 mg | ORAL_TABLET | Freq: Four times a day (QID) | ORAL | Status: DC | PRN

## 2023-06-29 MED ORDER — DAPTOMYCIN IV (FOR PTA / DISCHARGE USE ONLY): 650.0000 mg | INTRAVENOUS | 0 refills | Status: DC

## 2023-06-29 MED ORDER — ENOXAPARIN SODIUM 40 MG/0.4ML IJ SOSY
40.0000 mg | PREFILLED_SYRINGE | Freq: Every day | INTRAMUSCULAR | Status: DC
  Administered 2023-06-30 – 2023-07-03 (×4): 40 mg via SUBCUTANEOUS
  Filled 2023-06-29 (×4): qty 0.4

## 2023-06-29 MED ORDER — ACETAMINOPHEN 500 MG PO TABS
1000.0000 mg | ORAL_TABLET | Freq: Three times a day (TID) | ORAL | Status: DC
  Administered 2023-06-30 – 2023-07-11 (×27): 1000 mg via ORAL
  Filled 2023-06-29 (×33): qty 2

## 2023-06-29 MED ORDER — JUVEN PO PACK
1.0000 | PACK | Freq: Two times a day (BID) | ORAL | Status: DC
  Administered 2023-06-30 – 2023-07-04 (×8): 1 via ORAL
  Filled 2023-06-29 (×11): qty 1

## 2023-06-29 MED ORDER — GABAPENTIN 400 MG PO CAPS
800.0000 mg | ORAL_CAPSULE | Freq: Three times a day (TID) | ORAL | Status: DC
  Administered 2023-06-29 – 2023-07-21 (×60): 800 mg via ORAL
  Filled 2023-06-29 (×30): qty 2
  Filled 2023-06-29: qty 8
  Filled 2023-06-29 (×24): qty 2
  Filled 2023-06-29: qty 8
  Filled 2023-06-29 (×6): qty 2

## 2023-06-29 MED ORDER — SENNOSIDES-DOCUSATE SODIUM 8.6-50 MG PO TABS
1.0000 | ORAL_TABLET | Freq: Every day | ORAL | Status: DC
  Administered 2023-06-29 – 2023-07-19 (×12): 1 via ORAL
  Filled 2023-06-29 (×16): qty 1

## 2023-06-29 MED ORDER — MIRTAZAPINE 15 MG PO TBDP
15.0000 mg | ORAL_TABLET | Freq: Every day | ORAL | Status: DC
  Administered 2023-06-30 – 2023-07-05 (×6): 15 mg via ORAL
  Filled 2023-06-29 (×7): qty 1

## 2023-06-29 MED ORDER — ENSURE ENLIVE PO LIQD: 237.0000 mL | Freq: Three times a day (TID) | ORAL | 12 refills | Status: DC

## 2023-06-29 MED ORDER — METHOCARBAMOL 500 MG PO TABS: 500.0000 mg | ORAL_TABLET | Freq: Three times a day (TID) | ORAL | 0 refills | Status: DC

## 2023-06-29 MED ORDER — LOSARTAN POTASSIUM 25 MG PO TABS
25.0000 mg | ORAL_TABLET | Freq: Every day | ORAL | Status: DC
  Administered 2023-06-30 – 2023-07-04 (×5): 25 mg via ORAL
  Filled 2023-06-29 (×5): qty 1

## 2023-06-29 MED ORDER — METHOCARBAMOL 500 MG PO TABS
500.0000 mg | ORAL_TABLET | Freq: Three times a day (TID) | ORAL | 0 refills | Status: DC
  Filled 2023-06-29: qty 21, 7d supply, fill #0

## 2023-06-29 MED ORDER — METHOCARBAMOL 500 MG PO TABS
500.0000 mg | ORAL_TABLET | Freq: Three times a day (TID) | ORAL | Status: DC
  Administered 2023-06-29 – 2023-06-30 (×4): 500 mg via ORAL
  Filled 2023-06-29 (×4): qty 1

## 2023-06-29 MED ORDER — PANTOPRAZOLE SODIUM 40 MG PO TBEC
40.0000 mg | DELAYED_RELEASE_TABLET | Freq: Two times a day (BID) | ORAL | Status: DC
  Administered 2023-06-30 – 2023-07-04 (×9): 40 mg via ORAL
  Filled 2023-06-29 (×9): qty 1

## 2023-06-29 MED ORDER — CEFTRIAXONE IV (FOR PTA / DISCHARGE USE ONLY)
2.0000 g | INTRAVENOUS | 0 refills | Status: DC
Start: 2023-06-29 — End: 2023-07-28

## 2023-06-29 MED ORDER — SODIUM CHLORIDE 0.9 % IV SOLN
2.0000 g | INTRAVENOUS | Status: DC
  Administered 2023-06-30 – 2023-07-04 (×5): 2 g via INTRAVENOUS
  Filled 2023-06-29 (×5): qty 20

## 2023-06-29 MED ORDER — METHOCARBAMOL 500 MG PO TABS: 500.0000 mg | ORAL_TABLET | Freq: Three times a day (TID) | ORAL | Status: DC

## 2023-06-29 MED ORDER — LORAZEPAM 0.5 MG PO TABS
0.5000 mg | ORAL_TABLET | Freq: Three times a day (TID) | ORAL | Status: DC | PRN
  Administered 2023-07-02 – 2023-07-03 (×3): 0.5 mg via ORAL
  Filled 2023-06-29 (×3): qty 1

## 2023-06-29 MED ORDER — ADULT MULTIVITAMIN W/MINERALS CH: 1.0000 | ORAL_TABLET | Freq: Every day | ORAL | 0 refills | Status: DC

## 2023-06-29 NOTE — Care Management (Signed)
ED RN CM consulted patient returned to [name] after discharge home today. Patient reports being unable to care for self patient is contracted with decub ulcers, and on IV antibiotics via PICC line. Patient states, that he is in agreeable to SNF for short term rehab. TOC will follow up in the am.

## 2023-06-29 NOTE — TOC Transition Note (Signed)
Transition of Care [facility]) - CM/SW Discharge Note   Patient Details  Name: [name] MRN: [id number] Date of Birth: 1956/06/27  Transition of Care [facility]) CM/SW Contact:  Janae Bridgeman, RN Phone Number: 06/29/2023, 10:25 AM   Clinical Narrative:    CM will be discharging home today with care provided by the patient's brother at the home.  I called Jeri Modena, RNCM with [employer] and patient will need to receive IV Daptomycin and Rocephin through PICC line.  Bedside nursing is aware.  PTAR will be coordinated for home once patient has received his daily dose of Daptomycin and Rocephin.  Patient will discharge home with dressing supplies and PICC line for home.  [facility] called and a hospital follow up appointment is  scheduled.    [facility] plans to see the patient this week - possibly today to set up services starting August 1.  Patient states that [employer] has not delivered the air mattress to the patient's home.  I called Mitchel, CM with [employer]  and asked that mattress be delivered today.  Patient will be discharged once IV antibiotics and given and PTAR is coordinated later this afternoon once received.    [facility] to follow the patient for [facility] RN and will see the patient tomorrow for [facility] initial assessment.   Final next level of care: Home w Home Health Services Barriers to Discharge: Continued Medical Work up   Patient Goals and CMS Choice CMS Medicare.gov Compare Post Acute Care list provided to:: Patient Choice offered to / list presented to : Patient  Discharge Placement                         Discharge Plan and Services Additional resources added to the After Visit Summary for     Discharge Planning Services: CM Consult Post Acute Care Choice: Home Health                               Social Determinants of Health (SDOH) Interventions SDOH Screenings   Food Insecurity: Food Insecurity Present (06/24/2023)  Housing: Low Risk   (06/24/2023)  Transportation Needs: No Transportation Needs (06/24/2023)  Utilities: Not At Risk (06/24/2023)  Tobacco Use: High Risk (06/20/2023)     Readmission Risk Interventions    06/22/2023    4:22 PM 05/04/2023    1:50 PM  Readmission Risk Prevention Plan  Transportation Screening Complete Complete  PCP or Specialist Appt within 5-7 Days  Complete  PCP or Specialist Appt within 3-5 Days Complete   Home Care Screening  Complete  Medication Review (RN CM)  Referral to Pharmacy  HRI or Home Care Consult Complete   Social Work Consult for Recovery Care Planning/Counseling Complete   Palliative Care Screening Complete   Medication Review [employer]) Complete

## 2023-06-29 NOTE — Plan of Care (Signed)
Patient [name], irritable. VSS throughout shift, generalized weakness. All meds given on time as ordered. Pt c/o pain relieved by PRN oxycodone.  Purewick in place. Sacrum dressing change done this morning.  POC maintained, will continue to monitor.   Problem: Education: Goal: Knowledge of General Education information will improve Description: Including pain rating scale, medication(s)/side effects and non-pharmacologic comfort measures Outcome: Progressing   Problem: Health Behavior/Discharge Planning: Goal: Ability to manage health-related needs will improve Outcome: Progressing   Problem: Clinical Measurements: Goal: Ability to maintain clinical measurements within normal limits will improve Outcome: Progressing Goal: Will remain free from infection Outcome: Progressing Goal: Diagnostic test results will improve Outcome: Progressing Goal: Respiratory complications will improve Outcome: Progressing Goal: Cardiovascular complication will be avoided Outcome: Progressing   Problem: Activity: Goal: Risk for activity intolerance will decrease Outcome: Progressing   Problem: Nutrition: Goal: Adequate nutrition will be maintained Outcome: Progressing   Problem: Coping: Goal: Level of anxiety will decrease Outcome: Progressing   Problem: Elimination: Goal: Will not experience complications related to bowel motility Outcome: Progressing Goal: Will not experience complications related to urinary retention Outcome: Progressing   Problem: Pain Managment: Goal: General experience of comfort will improve Outcome: Progressing   Problem: Safety: Goal: Ability to remain free from injury will improve Outcome: Progressing   Problem: Skin Integrity: Goal: Risk for impaired skin integrity will decrease Outcome: Progressing   

## 2023-06-29 NOTE — H&P (Incomplete)
Date: 06/29/2023               Patient Name:  [name] MRN: [id number]  DOB: September 11, 1956 Age / Sex: 67 y.o., male   PCP: Pcp, No         Medical Service: Internal Medicine Teaching Service         Attending Physician: Dr. Oswaldo Kane, [name], *      First Contact: Dr. Annett Fabian, MD Pager [phone]    Second Contact: Dr. Elza Rafter, DO Pager [phone]         After Hours (After 5p/  First Contact Pager: [phone]  weekends / holidays): Second Contact Pager: [phone]   SUBJECTIVE   Chief Complaint: Placement  History of Present Illness:  [name]. Lady is a 67 year old man who was discharged this morning 7/8 to home with care provided by his brother at home. His brother states that no nurse came to his home to help him take care of patient's wound dressing. He was concerned and called home health, but received no response. Patient's brother also mentioned that he was not educated on wound dressing and was concerned about taking care of him. He was able to give antibiotics through patient's PICC line. Patient expressed that he does not have money to pay for SNF placement. The reason he was against SNF was because they required him to sign over his check and take his land. At home, Patient endorsed symptoms of shortness of breath and pain at his wound sites. Brother called EMS worried about his condition. Per the case manager, patient's home health was denied by insurance because of financial dilemma. Insurance will not cover for SNF either unless he provides some form of payment. Currently, patient denies chest pain, fevers, abdominal pain, and worsening shortness of breath. He continues to endorse considerable pain at his wound site.   Meds:  Ceftriaxone 2 g IVPB  Daptomycin 650 mg IVPB Gabapentin 800 mg 3 times daily Lorazepam every 8 hours as needed Losartan 25 mg daily Methocarbamol 500 mg 3 times a day for 7 days Mirtazapine 15 mg at bedtime Multivitamin with minerals  daily Juven 1 packet 2 times daily between meals Feeding supplement LIQd 3 times daily between meals Oxycodone 5 mg twice daily  Pantoprazole 40 mg twice daily Senna docusate 8.6-15 mg at bedtime  Past Medical History Paraplegia  Physical deconditioning Malnutrition  Hypertension Hyperlipidemia Osteomyelitis of the right greater trochanter GI bleed 2/2 gastric and duodenal ulcers  Past Surgical History:  Procedure Laterality Date  . BIOPSY  05/01/2023   Procedure: BIOPSY;  Surgeon: Walter Fiedler, MD;  Location: [facility] ENDOSCOPY;  Service: Gastroenterology;;  . ESOPHAGOGASTRODUODENOSCOPY (EGD) WITH PROPOFOL N/A 05/01/2023   Procedure: ESOPHAGOGASTRODUODENOSCOPY (EGD) WITH PROPOFOL;  Surgeon: Walter Fiedler, MD;  Location: [facility] ENDOSCOPY;  Service: Gastroenterology;  Laterality: N/A;  . FEMUR IM NAIL Left 03/27/2023   Procedure: INTRAMEDULLARY (IM) RETROGRADE FEMORAL NAILING;  Surgeon: Walter Galas, MD;  Location: [facility] OR;  Service: Orthopedics;  Laterality: Left;  . SPINE SURGERY      Social:  Lives With: home with brother Support: Brother Level of Function: fully dependent on ADLS and IADLS PCP: [name] Substances: Smokes 3 cigarettes a day.   Family History:  No pertinent family history   Allergies: Allergies as of 06/29/2023 - Review Complete 06/29/2023  Allergen Reaction Noted  . Lipitor [atorvastatin calcium] Other (See Comments) 01/28/2012    Review of Systems: A complete ROS was  negative except as per HPI.   OBJECTIVE:   Physical Exam: Blood pressure (!) 165/79, pulse (!) 107, temperature 98.4 F (36.9 C), temperature source Oral, resp. rate (!) 25, height 5\' 10"  (1.778 m), weight 83.5 kg, SpO2 94 % on Room Air Constitutional: cachetic, laying in bed, in mild to moderate distress  HENT: normocephalic atraumatic, mucous membranes moist Eyes: conjunctiva non-erythematous Cardiovascular: regular rate and rhythm, no m/r/g. 2+ positive bilateral radial and  dorsal pedis pulses intact Pulmonary/Chest: normal work of breathing on room air, lungs clear to auscultation bilaterally Abdominal: soft, non-tender, non-distended MSK: bilateral lower extremity contractures. +PICC line in the left upper arm Neurological: alert & oriented x 3.  Skin: Decubitus ulcer on the right hip, non erythematous, no pus/bloody discharge, covered with clean intact mepelex dressing. +ulcer on his left heel and right ankle.  Psych: teary, upset mood and affect   Labs: CBC    Component Value Date/Time   WBC 7.6 06/29/2023 0336   RBC 2.92 (L) 06/29/2023 0336   HGB 8.3 (L) 06/29/2023 0336   HCT 26.6 (L) 06/29/2023 0336   PLT 438 (H) 06/29/2023 0336   MCV 91.1 06/29/2023 0336   MCV 87.3 01/28/2012 1409   MCH 28.4 06/29/2023 0336   MCHC 31.2 06/29/2023 0336   RDW 14.9 06/29/2023 0336   LYMPHSABS 1.7 06/29/2023 0336   MONOABS 0.7 06/29/2023 0336   EOSABS 0.2 06/29/2023 0336   BASOSABS 0.1 06/29/2023 0336     CMP     Component Value Date/Time   NA 137 06/29/2023 2242   K 4.7 06/29/2023 2242   CL 107 06/29/2023 2242   CO2 18 (L) 06/29/2023 2242   GLUCOSE 109 (H) 06/29/2023 2242   BUN 18 06/29/2023 2242   CREATININE 0.65 06/29/2023 2242   CREATININE 1.09 01/28/2012 1406   CALCIUM 8.5 (L) 06/29/2023 2242   PROT 5.5 (L) 06/29/2023 0336   ALBUMIN 1.7 (L) 06/29/2023 0336   AST 25 06/29/2023 0336   ALT 25 06/29/2023 0336   ALKPHOS 64 06/29/2023 0336   BILITOT 0.4 06/29/2023 0336   GFRNONAA >60 06/29/2023 2242   GFRAA >60 09/12/2020 1500    Imaging: None per this visit EKG: None per this visit.   ASSESSMENT & PLAN:   Assessment & Plan by Problem: Principal Problem:   Infected decubitus ulcer   [name] is a 67 y.o. person with pertinent past medical history of paraplegia, physical deconditioning, malnutrition, hypertension, hyperlipidemia, osteomyelitis of the right greater trochanter, GI bleed 2/2 gastric and duodenal ulcers who presented to  ED for shortness of breath, admitted for osteomyelitis of the right greater trochanter.   Osteomyelitis of the right greater trochanter secondary to infected right decubitus ulcer MRI of the right hip performed on 07/01 with confirmed osteomyelitis. Wound appeared non erythematous, no pus or bloody discharge, with a clean mesepelx dressing. Patient is non-febrile, with mild TTP at the wound site. He is on 6 weeks of daptomycin/ceftriaxone therapy via his PICC line in the left upper arm, which we will continue as recommended during his discharge. No fevers since the discharge and overall   Plan: - Wound care is consulted for dressing change - Continue his Daptomycin/Ceftriaxone therapy here, last dose on 07/31/2023 - Pain management regimen: Tylenol 1000 mg q8h, oxycodone 5 mg q3h PRN, gabapentin 800 mg TID, robaxin 500 mg TID  Sarcopenia Bilateral lower extremity weakness and contractures Paraplegia  Bedridden since his left femur fracture in 03/2023, patient is immobile with severe bilateral  lower extremity contractures. He continues to endorse significant pain with movement. He was discharged with Robaxin, Gabapentin, and Oxycodone for pain management regimen.   Plan: - Continue pain management regimen  - Consulted PT/OT   Malnutrition  Appears cachetic and severely malnutrition.  -Will continue his home regimen mirtazapine, multivitamins, and Juven   Hypertension Continue Losartan 25 mg   Chronic Normocytic Anemia History of gastric and duodenal ulcers Per chart review, baseline hemoglobin is around 9-10. No recent GI bleed noted.  - Continue Protonix 40 mg BID  - Recommend outpatient EGD repeat   Diet: Normal VTE: Enoxaparin IVF: None,None Code: Full  Prior to Admission Living Arrangement: Home Anticipated Discharge Location: Unknown Barriers to Discharge: medical management  Dispo: Admit patient to Observation with expected length of stay less than 2  midnights.  Signed: Jeral Pinch, DO Internal Medicine Resident PGY-1  06/29/2023, 11:58 PM

## 2023-06-29 NOTE — ED Provider Notes (Signed)
[place] EMERGENCY DEPARTMENT AT [facility] Provider Note   CSN: [id number] Arrival date & time: 06/29/23  2116     History  Chief Complaint  Patient presents with   Placement    [name] is a 67 y.o. male.  Patient here from home.  He was just discharged this morning from the hospital.  Has osteomyelitis of the right hip.  He is supposed to be on daptomycin and Rocephin.  He was discharged to skilled nursing facility but when he got there he refused to stay and went back home where he lives with his brother.  However brother could not take care of him and he was sent bac to be placed again to the emergency department.  Patient has no complaints.  Vital signs normal with EMS.  The history is provided by the patient and the EMS personnel.       Home Medications Prior to Admission medications   Medication Sig Start Date End Date Taking? Authorizing Provider  cefTRIAXone (ROCEPHIN) IVPB Inject 2 g into the vein daily. Indication:  Decubitus ulcer/osteomyelitis  First Dose: Yes Last Day of Therapy:  07/31/23 Labs - Once weekly:  CBC/D and BMP, Labs - Once weekly: ESR and CRP Method of administration: IV Push Method of administration may be changed at the discretion of home infusion pharmacist based upon assessment of the patient and/or caregiver's ability to self-administer the medication ordered. 06/29/23 08/05/23  Annett Fabian, MD  daptomycin (CUBICIN) IVPB Inject 650 mg into the vein daily. Indication:  Decubitus ulcer/osteomyelitis  First Dose: Yes Last Day of Therapy:  07/31/23 Labs - Once weekly:  CBC/D, BMP, and CPK Labs - Once weekly: ESR and CRP Method of administration: IV Push Method of administration may be changed at the discretion of home infusion pharmacist based upon assessment of the patient and/or caregiver's ability to self-administer the medication ordered. 06/29/23 08/05/23  Annett Fabian, MD  feeding supplement (ENSURE ENLIVE / ENSURE PLUS)  LIQD Take 237 mLs by mouth 3 (three) times daily between meals. 06/29/23   Annett Fabian, MD  gabapentin (NEURONTIN) 800 MG tablet Take 800 mg by mouth 3 (three) times daily.    [provider]  LORazepam (ATIVAN) 0.5 MG tablet Take 1 tablet (0.5 mg total) by mouth every 8 (eight) hours as needed (nerve provlem). 05/10/23   Bethann Berkshire, MD  losartan (COZAAR) 25 MG tablet Take 25 mg by mouth daily.    [provider]  methocarbamol (ROBAXIN) 500 MG tablet Take 1 tablet (500 mg total) by mouth 3 (three) times daily for 7 days. 06/29/23 07/06/23  Atway, Rayann N, DO  mirtazapine (REMERON SOL-TAB) 15 MG disintegrating tablet Take 1 tablet (15 mg total) by mouth at bedtime. 04/01/23 03/31/24  Morene Crocker, MD  Multiple Vitamin (MULTIVITAMIN WITH MINERALS) TABS tablet Take 1 tablet by mouth daily. 06/29/23   Annett Fabian, MD  nutrition supplement, JUVEN, (JUVEN) PACK Take 1 packet by mouth 2 (two) times daily between meals. 06/29/23 07/29/23  Annett Fabian, MD  oxyCODONE (OXY IR/ROXICODONE) 5 MG immediate release tablet Take 5 mg by mouth 2 (two) times daily. 05/29/23   [provider]  pantoprazole (PROTONIX) 40 MG tablet Take 1 tablet (40 mg total) by mouth 2 (two) times daily before a meal. Patient not taking: Reported on 05/09/2023 05/04/23 06/03/23  Willette Cluster, MD  senna-docusate (SENOKOT-S) 8.6-50 MG tablet Take 1 tablet by mouth at bedtime. 05/08/23   Willette Cluster, MD  Allergies    Lipitor [atorvastatin calcium]    Review of Systems   Review of Systems  Physical Exam Updated Vital Signs There were no vitals taken for this visit. Physical Exam Vitals and nursing note reviewed.  Constitutional:      General: He is not in acute distress.    Appearance: He is well-developed.     Comments: Chronically deconditioned  HENT:     Head: Normocephalic and atraumatic.  Eyes:     Conjunctiva/sclera: Conjunctivae normal.  Cardiovascular:     Rate and Rhythm:  Normal rate and regular rhythm.     Heart sounds: No murmur heard. Pulmonary:     Effort: Pulmonary effort is normal. No respiratory distress.     Breath sounds: Normal breath sounds.  Abdominal:     Palpations: Abdomen is soft.     Tenderness: There is no abdominal tenderness.  Musculoskeletal:        General: No swelling.     Cervical back: Neck supple.  Skin:    General: Skin is warm and dry.     Capillary Refill: Capillary refill takes less than 2 seconds.     Comments: Wound dressings in place over right hip, margins are clean dry and intact  Neurological:     Mental Status: He is alert.  Psychiatric:        Mood and Affect: Mood normal.     ED Results / Procedures / Treatments   Labs (all labs ordered are listed, but only abnormal results are displayed) Labs Reviewed  CBC WITH DIFFERENTIAL/PLATELET  BASIC METABOLIC PANEL    EKG None  Radiology No results found.  Procedures Procedures    Medications Ordered in ED Medications  cefTRIAXone (ROCEPHIN) 2 g in sodium chloride 0.9 % 100 mL IVPB (has no administration in time range)  DAPTOmycin (CUBICIN) 650 mg in sodium chloride 0.9 % IVPB (has no administration in time range)    ED Course/ Medical Decision Making/ A&P                             Medical Decision Making Amount and/or Complexity of Data Reviewed Labs: ordered.  Risk Decision regarding hospitalization.   [name] is here for skilled nursing facility placement.  Normal vitals.  No fever.  History of diabetes, hypertension, high cholesterol.  Was discharged this morning to skilled nursing facility.  He was admitted here for right hip osteomyelitis.  He had normal labs this morning and was discharged to facility but when he got there he refused to stay.  He went back home and family could not take care of him and sent him back here for placement.  Overall he is well-appearing.  Vital signs are normal.  He had lab work this morning that were  unremarkable.  I have reached out to internal medicine teaching service to see if they will readmit him until he can be placed again.  Will get basic labs.  Patient to be readmitted to internal medicine team.  This chart was dictated using voice recognition software.  Despite best efforts to proofread,  errors can occur which can change the documentation meaning.         Final Clinical Impression(s) / ED Diagnoses Final diagnoses:  Osteomyelitis, unspecified site, unspecified type [facility])    Rx / DC Orders ED Discharge Orders     None         Virgina Norfolk, DO 06/29/23  2201  

## 2023-06-29 NOTE — Hospital Course (Addendum)
Aspiration pneumonia He experienced new respiratory distress on 7/10 with tachycardia and tachypnea.  CTA was obtained for concerns of pulmonary embolism, but instead demonstrated a multilobar pneumonia predominantly in the left lower lobe. This was new from a prior CT a few days earlier. Given his generalized weakness and bedbound status, we felt it was most likely aspiration pneumonia. He was already on daptomycin and ceftriaxone for his osteomyelitis; we added Flagyl for anaerobic coverage.  He experienced some respiratory distress and required up to 6 L nasal cannula.  Since the chest CT showed evidence of a loculated parapneumonic effusion, a chest tube was placed on 7/15 for drainage by pulmonology. It drained ~2L in total over a week. On 7/19 he spiked a fever after refusing a few doses of tylenol. We held the tylenol, concerned it was masking an underlying fever, and transitioned him to vancomyin/cefepime. His fever resolved, but his pneumonia continued to worsen on the right, so ID was reconsulted. They switched him to Linezolid and unasyn for better pulmonary and MRSA coverage.  He was then switched back to Daptomycin and Ceftriaxone, and repeat chest x-ray showed interval improvement on respiratory status.   Osteomyelitis of the right greater trochanter secondary to an infected decubitus ulcer of the right lateral hip He was initially admitted on 6/27 for infected ulcer of the right hip with purulent discharge.  CRP was 16.6, ESR was 70.  MRI demonstrated a large decubitus ulcer posterolateral to the right greater trochanter with cortical destruction consistent with osteomyelitis.  Infectious diseases was consulted and the patient began a 6-week course of daptomycin and ceftriaxone, which ends on 8/9. A PICC line was placed in his left arm for IV antibiotic administration. The ulcer was debrided by general surgery.  He received daily hydrotherapy and dressing changes during his hospitalization.   Blood cultures remained negative and he has remained afebrile. His leukocytosis quickly resolved with antibiotics.  His wound healing is complicated by malnutrition and immobility, as he has been bedbound for several months and has developed severe pain and contractures in his lower extremities, as well as generalized weakness and muscle atrophy.  He has been getting weekly CK labs to monitor for myositis in the setting of daptomycin use.  We have also been monitoring weekly CBC, CMP, and ESR/CRP. He is now on Ceftriaxone and daptomycin to finish on 8/9 per infectious disease.   Muscle spasms Sarcopenia, bilateral LE weakness and contractures Poor nutritional status Patient has been bedbound since April secondary to severe contractures and pain in his lower extremities.  His immobility began following a femoral fracture, which was repaired by IM in on 4/5.  He has developed significant sarcopenia due to this immobility and is unable to care for himself on his own.  He also has muscle spasms which are being treated with Robaxin 750 mg 3 times daily.  He has not been able to do much physical therapy during his hospital stay due to his pain, contractures, and weakness; PT recommended home PT if the patient is able, as it will greatly improve his condition.  He was previously discharged home by his request, and home health was set up to help care for him.,  He says he was in too much pain and his brother was unable to assist him like he needed to be assisted, so he returned to the hospital for readmission on 7/8.  He has been on nutrition supplementation throughout his hospitalization to help with wound healing; he has also received  frequent turning and offloading.  His pain regimen includes Tylenol, gabapentin 800 mg 3 times daily, and Dilaudid PO 2mg  Q6H PRN, dilaudid 1mg  Q3H PRN.  Elevated ALT, AST Recent uptake since switch back to Daptomycin and Ceftriaxone. Currently asymptomatic, RUQ ultrasound obtained  did not show any concern. Trending downwards, would recommend follow up in the outpatient setting. CK within normal limits as well.    Oral/Esophageal thrush Newly worsening dysphagia/odynophagia Silent aspiration He had a modified barium swallow, which was concerning for aspiration with possible acute on chronic dysphagia in the setting of infection and deconditioning.  SLP recommended making him NPO given his ongoing pneumonia and reengaging palliative care for discussion of goals of care. He had a CorTrak placed and is receiving all nutrition by tube. He is NPO and all medications are IV/IM/per tube. On 07/27/23, his cortrak was removed and he was tolerating PO intake well. SLP evaluated him and believe he is improving as well.  He is on IV Fluconazole for his oral thrush, set to finish on 8/9.   Chronic Normocytic anemia History of gastric and duodenal ulcers His baseline hemoglobin appears to be around 9-10.  It has remained stable throughout admission.  No recent GI bleeding has been noted.  Gastric ulcers and duodenal ulcers were noted on a prior EGD in April; he may need outpatient follow-up with GI.  He is taking Protonix 40 mg twice daily.  Hyperglycemia Pt carries diagnosis of Type 2 Diabetes Mellitus, however last A1c three months ago was 4.7. His glucose was controlled with a sliding scale of insulin, as well as semglee 10U while he was on tube feeds. Of note, there were times where his glucose level would fluctuate to 70-80 range, therefore recommend continued sliding scale with semglee of 10U.   Hypertension  Blood pressures have been slightly elevated during this admission.  He is taking losartan 25 mg daily at home, continued on admission.   Anxiety Pt has a history of anxiety which worsened during his hospitalization.  He is especially anxious about the possibility of dying.  We started him on Buspar 5 mg TID, and hydroxyzine PRN.

## 2023-06-29 NOTE — H&P (Incomplete)
Date: 06/29/2023               Patient Name:  [name] MRN: [id number]  DOB: 04/25/1956 Age / Sex: 67 y.o., male   PCP: Pcp, No         Medical Service: Internal Medicine Teaching Service         Attending Physician: Dr. Oswaldo Done, Marquita Palms, *      First Contact: Dr. Annett Fabian, MD Pager [phone]    Second Contact: Dr. Elza Rafter, DO Pager [phone]         After Hours (After 5p/  First Contact Pager: [phone]  weekends / holidays): Second Contact Pager: [phone]   SUBJECTIVE   Chief Complaint: Placement  History of Present Illness:  [name] is a 67 year old man who was discharged this morning 7/8 to home with care provided by his brother at home. His brother states that no nurse came to his home to help him take care of patient's wound dressing. He was concerned and called home health, but received no response. Patient's brother also mentioned that he was not educated on wound dressing and was concerned about taking care of him. He was able to give antibiotics through patient's PICC line. Patient expressed that he does not have money to pay for SNF placement. The reason he was against SNF was because they required him to sign over his check and take his land. At home, Patient endorsed symptoms of shortness of breath and pain at his wound sites. Brother called EMS worried about his condition. Per the case manager, patient's home health was denied by insurance because of financial dilemma. Insurance will not cover for SNF either unless he provides some form of payment. Currently, patient denies chest pain, fevers, abdominal pain, and worsening shortness of breath. He continues to endorse considerable pain at his wound site.   Meds:  Ceftriaxone 2 g IVPB  Daptomycin 650 mg IVPB Gabapentin 800 mg 3 times daily Lorazepam every 8 hours as needed Losartan 25 mg daily Methocarbamol 500 mg 3 times a day for 7 days Mirtazapine 15 mg at bedtime Multivitamin with minerals  daily Juven 1 packet 2 times daily between meals Feeding supplement LIQd 3 times daily between meals Oxycodone 5 mg twice daily  Pantoprazole 40 mg twice daily Senna docusate 8.6-15 mg at bedtime  Past Medical History Paraplegia  Physical deconditioning Malnutrition  Hypertension Hyperlipidemia Osteomyelitis of the right greater trochanter GI bleed 2/2 gastric and duodenal ulcers  Past Surgical History:  Procedure Laterality Date   BIOPSY  05/01/2023   Procedure: BIOPSY;  Surgeon: Beverley Fiedler, MD;  Location[facility] ENDOSCOPY;  Service: Gastroenterology;;   ESOPHAGOGASTRODUODENOSCOPY (EGD) WITH PROPOFOL N/A 05/01/2023   Procedure: ESOPHAGOGASTRODUODENOSCOPY (EGD) WITH PROPOFOL;  Surgeon: Beverley Fiedler, MD;  Location: [facility] ENDOSCOPY;  Service: Gastroenterology;  Laterality: N/A;   FEMUR IM NAIL Left 03/27/2023   Procedure: INTRAMEDULLARY (IM) RETROGRADE FEMORAL NAILING;  Surgeon: Myrene Galas, MD;  Location: [facility] OR;  Service: Orthopedics;  Laterality: Left;   SPINE SURGERY      Social:  Lives With: home with brother Support: Brother Level of Function: fully dependent on ADLS and IADLS PCP: Lovie Macadamia Substances: Smokes 3 cigarettes a day.   Family History:  No pertinent family history   Allergies: Allergies as of 06/29/2023 - Review Complete 06/29/2023  Allergen Reaction Noted   Lipitor [atorvastatin calcium] Other (See Comments) 01/28/2012    Review of Systems: A complete ROS was  negative except as per HPI.   OBJECTIVE:   Physical Exam: Blood pressure (!) 165/79, pulse (!) 107, temperature 98.4 F (36.9 C), temperature source Oral, resp. rate (!) 25, height 5\' 10"  (1.778 m), weight 83.5 kg, SpO2 94 % on Room Air Constitutional: cachetic, laying in bed, in mild to moderate distress  HENT: normocephalic atraumatic, mucous membranes moist Eyes: conjunctiva non-erythematous Cardiovascular: regular rate and rhythm, no m/r/g. 2+ positive bilateral radial and  dorsal pedis pulses intact Pulmonary/Chest: normal work of breathing on room air, lungs clear to auscultation bilaterally Abdominal: soft, non-tender, non-distended MSK: bilateral lower extremity contractures. +PICC line in the left upper arm Neurological: alert & oriented x 3.  Skin: Decubitus ulcer on the right hip, non erythematous, no pus/bloody discharge, covered with clean intact mepelex dressing. +ulcer on his left heel and right ankle.  Psych: teary, upset mood and affect   Labs: CBC    Component Value Date/Time   WBC 7.6 06/29/2023 0336   RBC 2.92 (L) 06/29/2023 0336   HGB 8.3 (L) 06/29/2023 0336   HCT 26.6 (L) 06/29/2023 0336   PLT 438 (H) 06/29/2023 0336   MCV 91.1 06/29/2023 0336   MCV 87.3 01/28/2012 1409   MCH 28.4 06/29/2023 0336   MCHC 31.2 06/29/2023 0336   RDW 14.9 06/29/2023 0336   LYMPHSABS 1.7 06/29/2023 0336   MONOABS 0.7 06/29/2023 0336   EOSABS 0.2 06/29/2023 0336   BASOSABS 0.1 06/29/2023 0336     CMP     Component Value Date/Time   NA 139 06/29/2023 0336   K 3.7 06/29/2023 0336   CL 105 06/29/2023 0336   CO2 26 06/29/2023 0336   GLUCOSE 171 (H) 06/29/2023 0336   BUN 22 06/29/2023 0336   CREATININE 0.65 06/29/2023 0336   CREATININE 1.09 01/28/2012 1406   CALCIUM 8.5 (L) 06/29/2023 0336   PROT 5.5 (L) 06/29/2023 0336   ALBUMIN 1.7 (L) 06/29/2023 0336   AST 25 06/29/2023 0336   ALT 25 06/29/2023 0336   ALKPHOS 64 06/29/2023 0336   BILITOT 0.4 06/29/2023 0336   GFRNONAA >60 06/29/2023 0336   GFRAA >60 09/12/2020 1500    Imaging: None per this visit EKG: None per this visit.   ASSESSMENT & PLAN:   Assessment & Plan by Problem: Principal Problem:   Infected decubitus ulcer   [name] is a 67 y.o. person with pertinent past medical history of paraplegia, physical deconditioning, malnutrition, hypertension, hyperlipidemia, osteomyelitis of the right greater trochanter, GI bleed 2/2 gastric and duodenal ulcers who presented to ED  for shortness of breath, admitted for osteomyelitis of the right greater trochanter.   Osteomyelitis of the right greater trochanter secondary to infected right decubitus ulcer MRI of the right hip performed on 07/01 with confirmed osteomyelitis. Wound appeared non erythematous, no pus or bloody discharge, with a clean mesepelx dressing. Patient is non-febrile, with mild TTP at the wound site. He is on 6 weeks of daptomycin/ceftriaxone therapy via his PICC line in the left upper arm, which we will continue as recommended during his discharge.  Plan: - Wound care is consulted for dressing change - Continue his Daptomycin/Ceftriaxone therapy here, last dose on 07/31/2023 - Pain management regimen: Tylenol 1000 mg q8h, oxycodone 5 mg q3h PRN, gabapentin 800 mg TID, robaxin 500 mg TID   Sarcopenia Bilateral lower extremity weakness and contractures Paraplegia  Bedridden since his left femur fracture in 03/2023, patient is immobile with severe bilateral lower extremity contractures. He continues to endorse significant  pain with movement. He was discharged with Robaxin, Gabapentin, and Oxycodone for pain management regimen.   Plan: - Continue pain management regimen  - Consulted PT/OT   Malnutrition  Appears cachetic and severely malnutrition.  -Will continue his home regimen mirtazapine, multivitamins, and Juven   Hypertension Continue Losartan 25 mg   Chronic Normocytic Anemia History of gastric and duodenal ulcers Per chart review, baseline hemoglobin is around 9-10. No recent GI bleed noted.  - Continue Protonix 40 mg BID  - Recommend outpatient EGD repeat   Diet: Normal VTE: Enoxaparin IVF: None,None Code: Full  Prior to Admission Living Arrangement: Home Anticipated Discharge Location: Unknown Barriers to Discharge: medical management  Dispo: Admit patient to Observation with expected length of stay less than 2 midnights.  Signed: Jeral Pinch, DO Internal Medicine  Resident PGY-1  06/29/2023, 10:29 PM

## 2023-06-29 NOTE — ED Notes (Signed)
Patient arrived via ems from home after being discharged home today and realizing he can not care for himself. Patient is bedbound has bilateral lower extremity contractures with boots to protect feet and ankles. Patient incontinent wears briefs was soiled and wet upon arrival. Patient has too many open wounds to list as they are documented from recent admission.Wounds are covered with clean dry intact mepelex dressings Patient has picc line in left upper arm.

## 2023-06-29 NOTE — ED Notes (Signed)
Patient able to swallow 4 pills at once followed by water with no difficulties. Patient turned from back to left side propped with pillows

## 2023-06-29 NOTE — Discharge Summary (Signed)
Name: [name] MRN: [id number] DOB: 02/19/1956 67 y.o. PCP: Pcp, No  Date of Admission: 06/18/2023  6:26 PM Date of Discharge:  06/29/2023 Attending Physician: Dr. Oswaldo Done  DISCHARGE DIAGNOSIS:  Primary Problem: Decubitus ulcer of right hip, unstageable [facility])   Hospital Problems: Principal Problem:   Decubitus ulcer of right hip, unstageable (HCC) Active Problems:   Physical deconditioning   Malnutrition of moderate degree   Paraplegia (HCC)    DISCHARGE MEDICATIONS:   Allergies as of 06/29/2023       Reactions   Lipitor [atorvastatin Calcium] Other (See Comments)   Myalgias         Medication List     STOP taking these medications    HYDROcodone-acetaminophen 5-325 MG tablet Commonly known as: NORCO/VICODIN       TAKE these medications    cefTRIAXone  IVPB Commonly known as: ROCEPHIN Inject 2 g into the vein daily. Indication:  Decubitus ulcer/osteomyelitis  First Dose: Yes Last Day of Therapy:  07/31/23 Labs - Once weekly:  CBC/D and BMP, Labs - Once weekly: ESR and CRP Method of administration: IV Push Method of administration may be changed at the discretion of home infusion pharmacist based upon assessment of the patient and/or caregiver's ability to self-administer the medication ordered.   daptomycin  IVPB Commonly known as: CUBICIN Inject 650 mg into the vein daily. Indication:  Decubitus ulcer/osteomyelitis  First Dose: Yes Last Day of Therapy:  07/31/23 Labs - Once weekly:  CBC/D, BMP, and CPK Labs - Once weekly: ESR and CRP Method of administration: IV Push Method of administration may be changed at the discretion of home infusion pharmacist based upon assessment of the patient and/or caregiver's ability to self-administer the medication ordered.   gabapentin 800 MG tablet Commonly known as: NEURONTIN Take 800 mg by mouth 3 (three) times daily.   LORazepam 0.5 MG tablet Commonly known as: Ativan Take 1 tablet (0.5 mg total) by mouth  every 8 (eight) hours as needed (nerve provlem).   losartan 25 MG tablet Commonly known as: COZAAR Take 25 mg by mouth daily.   methocarbamol 500 MG tablet Commonly known as: ROBAXIN Take 1 tablet (500 mg total) by mouth 3 (three) times daily for 7 days.   mirtazapine 15 MG disintegrating tablet Commonly known as: REMERON SOL-TAB Take 1 tablet (15 mg total) by mouth at bedtime.   multivitamin with minerals Tabs tablet Take 1 tablet by mouth daily.   nutrition supplement (JUVEN) Pack Take 1 packet by mouth 2 (two) times daily between meals.   feeding supplement Liqd Take 237 mLs by mouth 3 (three) times daily between meals.   oxyCODONE 5 MG immediate release tablet Commonly known as: Oxy IR/ROXICODONE Take 5 mg by mouth 2 (two) times daily.   pantoprazole 40 MG tablet Commonly known as: PROTONIX Take 1 tablet (40 mg total) by mouth 2 (two) times daily before a meal.   senna-docusate 8.6-50 MG tablet Commonly known as: Senokot-S Take 1 tablet by mouth at bedtime.               Durable Medical Equipment  (From admission, onward)           Start     Ordered   06/23/23 1451  For home use only DME Hospital bed  Once       Comments: Bed bound, paraplegia,  Question Answer Comment  Length of Need Lifetime   Patient has (list medical condition): 2 separate areas of Unstageable Pressure  Injury sacrum; most superior 2 cm x 2 cm 50% yellow slough 50% pink and moist; distal 2 cm x 3 cm 50% yellow slough 50% pink moist, R buttock Unstageable Pressure Injury 6 cm x 9 cm x 3 cm   The above medical condition requires: Patient requires the ability to reposition frequently   Head must be elevated greater than: 30 degrees   Bed type Semi-electric   Hoyer Lift Yes   Support Surface: Low Air loss Mattress      06/23/23 1453   06/23/23 1450  For home use only DME Other see comment  Once       Comments: Patient needs a bedside table since he is bed bound and unable to leave  the bedroom for meals.  Question:  Length of Need  Answer:  6 Months   06/23/23 1450              Discharge Care Instructions  (From admission, onward)           Start     Ordered   06/29/23 0000  Change dressing on IV access line weekly and PRN  (Home infusion instructions - [facility] )        06/29/23 1036   06/29/23 0000  Discharge wound care:       Comments: Wound care: Daily. Left heel, right ankle, right buttock (non-packed areas x2): Cleanse with normal saline, apply Calcium alginate and foam dressing daily and as needed.  Wound care: Every 12 hours. Right Buttock open unstageable- Cleanse with NS, Pack wet to moist, and cover surrounding skin with Calcium alginate and foam dressing twice daily and as needed.   06/29/23 1059            DISPOSITION AND FOLLOW-UP:  [name] was discharged from [facility] in [place] condition. At the hospital follow up visit please address:  1.  Osteomyelitis of the right greater trochanter: OM is secondary to a large infected decubitus ulcer of the right hip.  On 6 weeks of daptomycin/ceftriaxone via PICC line in the left arm, ending on 07/31/2023.  He will need a weekly CBC, CMP, ESR/CRP, CK.  Home health to assist at home.  Brother also educated who lives with patient about helping take care of him.  Patient will be evaluated by the [facility]ACE program upon discharge.  We also placed a referral to the wound clinic to help in managing his wounds.  2.  Deconditioning/bilateral lower extremity weakness/poor nutrition: He has been bedbound for some time following a L femoral fracture and has developed severe lower extremity contractures, weakness, and pain.  He is taking Robaxin 500 mg 3 times daily for muscle spasms, gabapentin 800 mg 3 times daily, and oxycodone 5 mg twice daily for pain.  We sent him home with nutrition supplements.  He was not amenable to PT during his hospital stay due to pain.  We  recommended a SNF, but the patient was strongly opposed and desired to proceed with [facility] home [facility].  [facility] will be providing him with an airflow mattress.  Follow-up Recommendations: Consults: Wound clinic Labs: CBC w/ diff, CMP, CK, ESR/CRP weekly Studies: None Medications: IV daptomycin and ceftriaxone until 07/31/2023.  Nutrition supplements added.  Home medications restarted as prescribed.   Follow-up Appointments:  Follow-up Information     [facility] Emergency Department at [facility]. Go to .   Specialty: Emergency Medicine Why: As needed, If symptoms worsen Contact  information: [address] [id number] mc [place] [address] [phone]        [facility] Follow up.   Why: [facility] will provide your IV anitbiotics for home.        [facility] Follow up.   Why: [facility] will provide for your home health RN. Contact information: [address] [place] [address] [phone]         [facility] Follow up.   Why: [facility] will be providing you with a air flow mattress.  I asked them about a bedside table if possible. Contact information: [address] [address] [phone]         Lovie Macadamia, MD Follow up on 07/06/2023.   Specialty: Internal Medicine Why: 1:15 pm telehealth appointment Contact information: [address] [place] [address] [phone]                 HOSPITAL COURSE:  Patient Summary: ZIER PALANCA is a 67 y.o. with a pertinent PMH of HTN, severe weakness of the lower extremities, history of GI bleed in the setting of gastric and duodenal ulcers, and malnutrition who presented with a worsening infected right hip decubitus ulcer and subsequently found to have osteomyelitis.  Osteomyelitis of the right greater trochanter 2/2 infected right decubitus ulcer On admission, noted purulent discharge from ulcer.  CRP elevated at  16.6.  ESR elevated at 70.  MRI demonstrated a large decubitus ulcer posterior lateral to the right femoral greater trochanter with underlying cortical destruction of the greater trochanter and marrow changes throughout the intertrochanteric region of the right femur, consistent with osteomyelitis.  Infectious diseases was consulted and the patient was started on daptomycin and ceftriaxone for a 6 week course, ending on 07/31/23.  General surgery performed a bedside debridement of necrotic tissue.  PT hydrotherapy was done daily.  Blood cultures remained negative; patient remained afebrile; leukocytosis resolved on antibiotics.  PICC line was placed for continued IV antibiotics in the outpatient setting.  We have been getting weekly CBC, CMP, ESR/CRP per infectious diseases recommendation.  Weekly CK has been monitored given risk of myositis with daptomycin.  Sarcopenia Bilateral lower extremity weakness and contractures Poor nutritional status Patient has been immobile since April secondary to severe pain and weakness in his bilateral lower extremities.  He had a prior femoral fracture s/p IMN on 03/27/2023.  He has remained bedbound for an extensive amount of time and has consequently developed sarcopenia and contractures bilaterally in his lower extremities.  He now experiences significant pain with movement and has been unable to complete physical therapy during his hospital stay.  He was previously in a SNF, but had negative experiences so would strongly prefer to be discharged home with home health.  We discussed the risks associated with choosing home health instead of a SNF given his significant medical problems, but the patient was adamant he wanted to go home. PT recommended home PT if the patient is able.  The [facility]ACE program will be evaluating him at home and his brother is able and willing to assist him.  He has been on nutrition supplementation. He has received frequent turning and offloading for wound  healing.  An air mattress was ordered for home.  His home pain regimen includes Tylenol, gabapentin 800 mg 3 times daily, oxycodone 5 mg twice daily, and Robaxin 500 mg 3 times daily.  He is taking mirtazapine 15 mg at home which should help with appetite  stimulation.  Chronic Normocytic anemia History of gastric and duodenal ulcers Baseline hemoglobin is around 9-10. It has ranged from 8-10 throughout admission.  Patient has not noted any recent GI bleeding.  Has a history of gastric and duodenal ulcer from prior EGD in April.  On Protonix 40 mg twice daily at home.  Will need a repeat outpatient EGD.   Hypertension He is taking losartan 25 mg daily at home for hypertension.  Blood pressures have been within normal limits throughout his admission.   DISCHARGE INSTRUCTIONS:   Discharge Instructions     [facility] pharmacist to adjust dose for Vancomycin, Aminoglycosides and other anti-infective therapies as requested by physician.   Complete by: As directed    [facility] to provide Cath Flo 2mg    Complete by: As directed    Administer for PICC line occlusion and as ordered by physician for other access device issues.   Ambulatory referral to Wound Clinic   Complete by: As directed    Anaphylaxis Kit: Provided to treat any anaphylactic reaction to the medication being provided to the patient if First Dose or when requested by physician   Complete by: As directed    Epinephrine 1mg /ml vial / amp: Administer 0.3mg  (0.68ml) subcutaneously once for moderate to severe anaphylaxis, nurse to call physician and pharmacy when reaction occurs and call 911 if needed for immediate care   Diphenhydramine 50mg /ml IV vial: Administer 25-50mg  IV/IM PRN for first dose reaction, rash, itching, mild reaction, nurse to call physician and pharmacy when reaction occurs   Sodium Chloride 0.9% NS IV: Administer if needed for hypovolemic blood pressure drop or as ordered by physician after  call to physician with anaphylactic reaction   Call MD for:  difficulty breathing, headache or visual disturbances   Complete by: As directed    Call MD for:  persistant dizziness or light-headedness   Complete by: As directed    Call MD for:  persistant nausea and vomiting   Complete by: As directed    Call MD for:  temperature >100.4   Complete by: As directed    Change dressing on IV access line weekly and PRN   Complete by: As directed    Diet - low sodium heart healthy   Complete by: As directed    Discharge instructions   Complete by: As directed    It was a pleasure to take care of you at [facility].  You were admitted for management and treatment of an infected right hip ulcer and osteomyelitis.  During your hospital stay, you received intravenous antibiotics, which you will continue at home.    Please note the following medication instructions:  You will continue to receive antibiotics (ceftriaxone and daptomycin) through the IV line in your left arm until August 9th.  Weekly labs need to be taken to monitor your progress.  Please continue to take Robaxin 500 mg 3 times daily for your muscle spasms.  For pain management, we have continued your gabapentin 800 mg 3 times a day, oxycodone 5 mg twice daily, and Tylenol as needed.  We have discontinued your norco/vicodin.  You may continue to take your Ativan 0.5 mg every 8 hours as needed, losartan 25 mg daily, mirtazapine 15 mg at bedtime, Protonix 40 mg twice daily before meals, and senna-docusate once daily at bedtime.  It is very important for you to continue to get adequate nutrition.  Please continue to take your multivitamin, Juven nutrition supplement,  and ensures.  We have coordinated to have a home health aide come to your home to help take care of you.  We have also coordinated with the PACE program, who will come evaluate you at home today.   We have placed a referral to the wound clinic.  They should be  contacting you about scheduling an appointment.  Your wound will require daily dressing changes and frequent offloading to ensure wound healing.  We have also scheduled a telehealth appointment for you on July 16th with a primary care provider at our internal medicine clinic.  If you have any concerns in the meantime, please call us at [phone].   Discharge wound care:   Complete by: As directed    Wound care: Daily. Left heel, right ankle, right buttock (non-packed areas x2): Cleanse with normal saline, apply Calcium alginate and foam dressing daily and as needed.  Wound care: Every 12 hours. Right Buttock open unstageable- Cleanse with NS, Pack wet to moist, and cover surrounding skin with Calcium alginate and foam dressing twice daily and as needed.   Flush IV access with Sodium Chloride 0.9% and Heparin 10 units/ml or 100 units/ml   Complete by: As directed    Home infusion instructions - [facility]   Complete by: As directed    Instructions: Flush IV access with Sodium Chloride 0.9% and Heparin 10units/ml or 100units/ml   Change dressing on IV access line: Weekly and PRN   Instructions Cath Flo 2mg : Administer for PICC Line occlusion and as ordered by physician for other access device   [facility] pharmacist to adjust dose for: Vancomycin, Aminoglycosides and other anti-infective therapies as requested by physician   Increase activity slowly   Complete by: As directed    Method of administration may be changed at the discretion of home infusion pharmacist based upon assessment of the patient and/or caregiver's ability to self-administer the medication ordered   Complete by: As directed        SUBJECTIVE:   Patient says he is doing okay today and would like to go home.  We spoke with him about our recommendation that he go to a SNF given his antibiotic and wound care needs, but he preferred home health.  Discussed with him his ongoing treatment plan including  antibiotics, weekly labs, adequate nutrition, and wound care.  Discharge Vitals:   BP (!) 142/67 (BP Location: Right Arm)   Pulse 89   Temp 97.7 F (36.5 C) (Oral)   Resp 18   Ht 5\' 10"  (1.778 m)   Wt 83.4 kg   SpO2 99%   BMI 26.38 kg/m   OBJECTIVE:  Physical Exam Cardiovascular:     Rate and Rhythm: Normal rate and regular rhythm.     Pulses: Normal pulses.     Heart sounds: Normal heart sounds.  Pulmonary:     Effort: Pulmonary effort is normal.     Breath sounds: Normal breath sounds.  Musculoskeletal:     Comments: There are contractures in his bilateral lower extremities, severely limiting his mobility  Skin:    Comments: Decubitus ulcers on the right hip, right lateral ankle, and left medial ankle  Neurological:     General: No focal deficit present.     Mental Status: He is alert and oriented to person, place, and time. Mental status is at baseline.     Pertinent Labs, Studies, and Procedures:     Latest Ref Rng & Units 06/29/2023  3:36 AM 06/26/2023    6:38 AM 06/25/2023    2:23 AM  CBC  WBC 4.0 - 10.5 K/uL 7.6  11.0  11.0   Hemoglobin 13.0 - 17.0 g/dL 8.3  9.4  8.6   Hematocrit 39.0 - 52.0 % 26.6  29.5  27.5   Platelets 150 - 400 K/uL 438  393  391        Latest Ref Rng & Units 06/29/2023    3:36 AM 06/24/2023    4:19 AM 06/23/2023    4:20 AM  CMP  Glucose 70 - 99 mg/dL 161  096  045   BUN 8 - 23 mg/dL 22  18  23    Creatinine 0.61 - 1.24 mg/dL 4.09  8.11  9.14   Sodium 135 - 145 mmol/L 139  136  136   Potassium 3.5 - 5.1 mmol/L 3.7  3.7  3.6   Chloride 98 - 111 mmol/L 105  105  106   CO2 22 - 32 mmol/L 26  23  24    Calcium 8.9 - 10.3 mg/dL 8.5  8.0  7.8   Total Protein 6.5 - 8.1 g/dL 5.5     Total Bilirubin 0.3 - 1.2 mg/dL 0.4     Alkaline Phos 38 - 126 U/L 64     AST 15 - 41 U/L 25     ALT 0 - 44 U/L 25       Signed: Annett Fabian, MD Internal Medicine Resident, PGY-1 [facility]  Pager: [phone]  11:27 AM,  06/29/2023

## 2023-06-29 NOTE — Progress Notes (Signed)
Pharmacy Antibiotic Note  [name] is a 67 y.o. male for which pharmacy has been consulted for  daptomycin  dosing for  decubitus ulcer -- on daptomycin OPAT .  Patient was discharged this morning 7/8 to home with care provided by his brother at home. Patient was to receive OPAT Daptomycin and Ceftriaxone until 8/9  Estimated Creatinine Clearance: 95.1 mL/min (by C-G formula based on SCr of 0.65 mg/dL).  Plan: Continue Daptomycin 650 mg IV every 24 hours + Ceftriaxone 2 gm IV Q 24 hours  Monitor WBC, fever, renal function, cultures F/u ID recommendations  Height: 5\' 10"  (177.8 cm) Weight: 83.5 kg (184 lb 1.4 oz) IBW/kg (Calculated) : 73  Temp (24hrs), Avg:98.1 F (36.7 C), Min:97.7 F (36.5 C), Max:98.4 F (36.9 C)  Recent Labs  Lab 06/23/23 0420 06/24/23 0419 06/25/23 0223 06/26/23 0638 06/29/23 0336  WBC 14.2* 12.5* 11.0* 11.0* 7.6  CREATININE 0.75 0.59*  --   --  0.65    Estimated Creatinine Clearance: 95.1 mL/min (by C-G formula based on SCr of 0.65 mg/dL).    Allergies  Allergen Reactions   Lipitor [Atorvastatin Calcium] Other (See Comments)    Myalgias     Microbiology results: Pending  Thank you for allowing pharmacy to be a part of this patient's care.  Delmar Landau, PharmD, BCPS 06/29/2023 11:29 PM ED Clinical Pharmacist -  [phone]

## 2023-06-30 DIAGNOSIS — B3781 Candidal esophagitis: Secondary | ICD-10-CM | POA: Diagnosis not present

## 2023-06-30 DIAGNOSIS — M62838 Other muscle spasm: Secondary | ICD-10-CM | POA: Diagnosis not present

## 2023-06-30 DIAGNOSIS — M86051 Acute hematogenous osteomyelitis, right femur: Secondary | ICD-10-CM

## 2023-06-30 DIAGNOSIS — L89113 Pressure ulcer of right upper back, stage 3: Secondary | ICD-10-CM | POA: Diagnosis not present

## 2023-06-30 DIAGNOSIS — M869 Osteomyelitis, unspecified: Secondary | ICD-10-CM | POA: Diagnosis not present

## 2023-06-30 DIAGNOSIS — E46 Unspecified protein-calorie malnutrition: Secondary | ICD-10-CM

## 2023-06-30 DIAGNOSIS — L089 Local infection of the skin and subcutaneous tissue, unspecified: Secondary | ICD-10-CM | POA: Diagnosis not present

## 2023-06-30 DIAGNOSIS — E43 Unspecified severe protein-calorie malnutrition: Secondary | ICD-10-CM | POA: Diagnosis not present

## 2023-06-30 DIAGNOSIS — E1165 Type 2 diabetes mellitus with hyperglycemia: Secondary | ICD-10-CM | POA: Diagnosis present

## 2023-06-30 DIAGNOSIS — Z4682 Encounter for fitting and adjustment of non-vascular catheter: Secondary | ICD-10-CM | POA: Diagnosis not present

## 2023-06-30 DIAGNOSIS — R64 Cachexia: Secondary | ICD-10-CM | POA: Diagnosis not present

## 2023-06-30 DIAGNOSIS — J90 Pleural effusion, not elsewhere classified: Secondary | ICD-10-CM | POA: Diagnosis not present

## 2023-06-30 DIAGNOSIS — B370 Candidal stomatitis: Secondary | ICD-10-CM | POA: Diagnosis not present

## 2023-06-30 DIAGNOSIS — J15212 Pneumonia due to Methicillin resistant Staphylococcus aureus: Secondary | ICD-10-CM | POA: Diagnosis not present

## 2023-06-30 DIAGNOSIS — Z79899 Other long term (current) drug therapy: Secondary | ICD-10-CM | POA: Diagnosis not present

## 2023-06-30 DIAGNOSIS — D638 Anemia in other chronic diseases classified elsewhere: Secondary | ICD-10-CM | POA: Diagnosis present

## 2023-06-30 DIAGNOSIS — M86151 Other acute osteomyelitis, right femur: Secondary | ICD-10-CM | POA: Diagnosis not present

## 2023-06-30 DIAGNOSIS — J869 Pyothorax without fistula: Secondary | ICD-10-CM | POA: Diagnosis not present

## 2023-06-30 DIAGNOSIS — G8220 Paraplegia, unspecified: Secondary | ICD-10-CM | POA: Diagnosis present

## 2023-06-30 DIAGNOSIS — R109 Unspecified abdominal pain: Secondary | ICD-10-CM | POA: Diagnosis not present

## 2023-06-30 DIAGNOSIS — G8929 Other chronic pain: Secondary | ICD-10-CM | POA: Diagnosis present

## 2023-06-30 DIAGNOSIS — Z66 Do not resuscitate: Secondary | ICD-10-CM | POA: Diagnosis not present

## 2023-06-30 DIAGNOSIS — J690 Pneumonitis due to inhalation of food and vomit: Secondary | ICD-10-CM | POA: Diagnosis not present

## 2023-06-30 DIAGNOSIS — E1169 Type 2 diabetes mellitus with other specified complication: Secondary | ICD-10-CM | POA: Diagnosis not present

## 2023-06-30 DIAGNOSIS — G9341 Metabolic encephalopathy: Secondary | ICD-10-CM | POA: Diagnosis not present

## 2023-06-30 DIAGNOSIS — R627 Adult failure to thrive: Secondary | ICD-10-CM | POA: Diagnosis present

## 2023-06-30 DIAGNOSIS — I10 Essential (primary) hypertension: Secondary | ICD-10-CM | POA: Diagnosis present

## 2023-06-30 DIAGNOSIS — L89219 Pressure ulcer of right hip, unspecified stage: Secondary | ICD-10-CM | POA: Diagnosis not present

## 2023-06-30 DIAGNOSIS — J439 Emphysema, unspecified: Secondary | ICD-10-CM | POA: Diagnosis present

## 2023-06-30 DIAGNOSIS — R0603 Acute respiratory distress: Secondary | ICD-10-CM | POA: Diagnosis not present

## 2023-06-30 DIAGNOSIS — E11622 Type 2 diabetes mellitus with other skin ulcer: Secondary | ICD-10-CM | POA: Diagnosis not present

## 2023-06-30 DIAGNOSIS — L8994 Pressure ulcer of unspecified site, stage 4: Secondary | ICD-10-CM | POA: Diagnosis not present

## 2023-06-30 DIAGNOSIS — Z515 Encounter for palliative care: Secondary | ICD-10-CM | POA: Diagnosis not present

## 2023-06-30 DIAGNOSIS — J918 Pleural effusion in other conditions classified elsewhere: Secondary | ICD-10-CM | POA: Diagnosis not present

## 2023-06-30 DIAGNOSIS — R531 Weakness: Secondary | ICD-10-CM | POA: Diagnosis not present

## 2023-06-30 DIAGNOSIS — F17210 Nicotine dependence, cigarettes, uncomplicated: Secondary | ICD-10-CM | POA: Diagnosis present

## 2023-06-30 DIAGNOSIS — F419 Anxiety disorder, unspecified: Secondary | ICD-10-CM | POA: Diagnosis not present

## 2023-06-30 DIAGNOSIS — T17908A Unspecified foreign body in respiratory tract, part unspecified causing other injury, initial encounter: Secondary | ICD-10-CM | POA: Diagnosis not present

## 2023-06-30 DIAGNOSIS — J9601 Acute respiratory failure with hypoxia: Secondary | ICD-10-CM | POA: Diagnosis not present

## 2023-06-30 DIAGNOSIS — R600 Localized edema: Secondary | ICD-10-CM

## 2023-06-30 DIAGNOSIS — L8990 Pressure ulcer of unspecified site, unspecified stage: Secondary | ICD-10-CM | POA: Diagnosis present

## 2023-06-30 LAB — BASIC METABOLIC PANEL
Anion gap: 8 (ref 5–15)
BUN: 14 mg/dL (ref 8–23)
CO2: 24 mmol/L (ref 22–32)
Calcium: 8.3 mg/dL — ABNORMAL LOW (ref 8.9–10.3)
Chloride: 103 mmol/L (ref 98–111)
Creatinine, Ser: 0.64 mg/dL (ref 0.61–1.24)
GFR, Estimated: >60 mL/min (ref >60)
Glucose, Bld: 123 mg/dL — ABNORMAL HIGH (ref 70–99)
Potassium: 3.8 mmol/L (ref 3.5–5.1)
Sodium: 135 mmol/L (ref 135–145)

## 2023-06-30 LAB — CBC
HCT: 30.1 % — ABNORMAL LOW (ref 39.0–52.0)
Hemoglobin: 9.2 g/dL — ABNORMAL LOW (ref 13.0–17.0)
MCH: 28.1 pg (ref 26.0–34.0)
MCHC: 30.6 g/dL (ref 30.0–36.0)
MCV: 92 fL (ref 80.0–100.0)
Platelets: 426 10*3/uL — ABNORMAL HIGH (ref 150–400)
RBC: 3.27 MIL/uL — ABNORMAL LOW (ref 4.22–5.81)
RDW: 14.9 % (ref 11.5–15.5)
WBC: 10.2 10*3/uL (ref 4.0–10.5)
nRBC: 0 % (ref 0.0–0.2)

## 2023-06-30 MED ORDER — COLLAGENASE 250 UNIT/GM EX OINT
TOPICAL_OINTMENT | Freq: Every day | CUTANEOUS | Status: DC
  Filled 2023-06-30 (×5): qty 30

## 2023-06-30 MED ORDER — CHLORHEXIDINE GLUCONATE CLOTH 2 % EX PADS
6.0000 | MEDICATED_PAD | Freq: Every day | CUTANEOUS | Status: DC
  Administered 2023-06-30 – 2023-07-28 (×24): 6 via TOPICAL

## 2023-06-30 NOTE — Consult Note (Addendum)
WOC Nurse Consult Note: patient well known to WOC team from previous admissions; just discharged 06/29/2023 for pressure injuries present at this visit; had bedside surgical debridement and PT hydrotherapy during that admission Reason for Consult: pressure injuries  Wound type: Unstageable Pressure Injuries, Stage 4 Pressure Injury  Pressure Injury POA: Yes Measurement: 1.  L medial heel Unstageable Pressure Injury 2 cm x 2 cm 100% soft black yellow necrotic tissue  2.  R lateral ankle Unstageable Pressure Injury 3 cm x 2 cm 100% yellow eschar  3.  R mid back Stage 3 Pressure Injury 2 cm x 5 cm x 0.1 cm 75% pink moist 25% yellow necrotic tissue  4.  R greater trochanter Stage 4 Pressure Injury total area 17 cms x 13 cms with 9 cm x 2 cm superior aspect with 50% yellow 50% pink moist tissue; distal area (that was previously debrided of a large amount of necrotic tissue) now 6 cm x 5 cm x 2 cm with approximately 6 cm undermining from 2 o'clock to 6 o'clock, 75% pink moist 25% yellow slough  Drainage (amount, consistency, odor) moderate Tan exudate from L heel, R ankle, R mid back, R trochanter  Periwound: pink scar tissue surrounding most wounds  Dressing procedure/placement/frequency:  Clean L medial heel, R lateral ankle, R mid back and R greater trochanter superior aspect with NS, apply Santyl to wound bed daily, cover with saline moist gauze and silicone foam or ABD pad.  May lift foam daily to apply Santyl. Change foam dressing q3 days and prn soiling.  Area of depth Stage 4 Right greater trochanter clean with Vashe [place] [phone]) then pack with Vashe moistened (not saturated) gauze twice daily making sure to cover all areas of undermining and wound bed.    Patient is wearing Prevalon boots at this time to offload pressure.  Ordered low air loss mattress for pressure redistribution and moisture management. Patient should remain on this level of support during entire hospitalization.    POC  discussed with patient and bedside nurse.  I did discuss with patient that his expectations for home health being at his home to provide wound care day of discharge were likely not realistic.  TOC working with patient and brother.    WOC team will not follow at this time. Re-consult if further needs arise.   Thank you,    Priscella Mann MSN, RN-BC, [employer] [phone]

## 2023-06-30 NOTE — Evaluation (Signed)
Occupational Therapy Evaluation Patient Details Name: MIRACLE NARASIMHAN MRN: [id number] DOB: Apr 03, 1956 Today's Date: 06/30/2023   History of Present Illness 67 yo male with PMH of hypertension, gastric ulcers, severe deconditioning, left femoral fracture s/p IMN, previously admitted and treated for right hip osteomyelitis.  Discharged with home health on 7/8, returned on 7/9.   Clinical Impression   Pt admitted with the above diagnosis. Pt currently with functional limitations due to the deficits listed below (see OT Problem List). Pt reports a significant change in functional performance since fracturing his left femur in April 2024. Prior to fx, pt was able to complete all BADL tasks and functional transfers at Mod I level. Brother did not need to provide any physical assist. Since fracturing his femur, pt has experienced severe pain and has not been able to tolerate any OOB activity. All movement and mobility activities increases pt's pain. Brother is unable to provide any physical assist d/t recent back surgery.  Pt will benefit from acute skilled OT to increase their safety and independence with ADL and functional mobility for ADL to facilitate discharge. Patient will benefit from continued inpatient follow up therapy, <3 hours/day. OT will continue to follow patient acutely.         Recommendations for follow up therapy are one component of a multi-disciplinary discharge planning process, led by the attending physician.  Recommendations may be updated based on patient status, additional functional criteria and insurance authorization.   Assistance Recommended at Discharge Frequent or constant Supervision/Assistance  Patient can return home with the following Two people to help with walking and/or transfers;Two people to help with bathing/dressing/bathroom;Assistance with cooking/housework;Help with stairs or ramp for entrance;Assist for transportation    Functional Status Assessment  Patient  has had a recent decline in their functional status and/or demonstrates limited ability to make significant improvements in function in a reasonable and predictable amount of time  Equipment Recommendations  None recommended by OT       Precautions / Restrictions Precautions Precautions: Fall Precaution Comments: fell in 03/2023 with L femur fx (missed w/c during transfer), 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 Bed features used to assist with boost. Pt able to hold onto bed rail with LUE and assist. total AX2 provided. VC provided during bed mobility for compensatory techniques and sequencing. Pt assisted with completing lateral supine scoot from regular bed towards left onto air mattress. Maxi move handle used to allow pt to pull trunk forward while 3 people used hospital pad to scoot pt over. 1 person assisted with BLE. Patient Response: Cooperative, Flat affect  Transfers Overall transfer level:  (unable to assess this session)           Balance Overall balance assessment: History of Falls (Eval completed at bed level. Unable to assess sitting balance d/t time constraint.)             ADL either performed or assessed with clinical judgement   ADL Overall ADL's : Needs assistance/impaired Eating/Feeding: Minimal assistance;Bed level Eating/Feeding Details (indicate cue type and reason): requires proper set up and positioning to be able to complete  self feeding effectively Grooming: Wash/dry face;Set up;Bed level   Upper Body Bathing: Bed level;Moderate assistance   Lower Body Bathing: Bed level;+2 for  safety/equipment;+2 for physical assistance;Total assistance;Cueing for compensatory techniques;Cueing for sequencing   Upper Body Dressing : Moderate assistance;Bed level   Lower Body Dressing: Total assistance;Bed level;+2 for safety/equipment;+2 for physical assistance;Cueing for sequencing;Cueing for compensatory techniques     Toilet Transfer Details (indicate cue type and reason): unable to assess transfer Toileting- Clothing Manipulation and Hygiene: Total assistance;+2 for physical assistance;+2 for safety/equipment;Bed level               Vision Baseline Vision/History: 0 No visual deficits Ability to See in Adequate Light: 0 Adequate Patient Visual Report: No change from baseline Vision Assessment?: No apparent visual deficits            Pertinent Vitals/Pain Pain Assessment Pain Assessment: 0-10 Pain Score: 10-Worst pain ever Pain Location: Buttocks, BLE during any movement Pain Descriptors / Indicators: Constant, Grimacing, Discomfort, Spasm Pain Intervention(s): Limited activity within patient's tolerance, Monitored during session, Repositioned, Other (comment) (Moved to air mattress bed during session)     Hand Dominance Right   Extremity/Trunk Assessment Upper Extremity Assessment Upper Extremity Assessment: RUE deficits/detail;LUE deficits/detail RUE Deficits / Details: A/ROM WFL. Limited er. MMT: shoulder flexion/abduction/IR/er: 4/5, elbow flexion: 5/5, elbow extension: 4/5. weak gross grasp although functional as he was able to hold onto maxi move bar and bed rails to assist with mobility. RUE Coordination: decreased fine motor LUE Deficits / Details: A/ROM WFL. Limited er. MMT: shoulder flexion/abduction/IR/er: 4/5, elbow flexion: 5/5, elbow extension: 4/5. weak gross grasp although functional as he was able to hold onto maxi move bar and bed rails to assist with mobility. LUE Coordination: decreased fine motor   Lower Extremity Assessment Lower  Extremity Assessment: Defer to PT evaluation   Cervical / Trunk Assessment Cervical / Trunk Assessment: Kyphotic   Communication Communication Communication: No difficulties   Cognition Arousal/Alertness: Awake/alert Behavior During Therapy: WFL for tasks assessed/performed Overall Cognitive Status: Within Functional Limits for tasks assessed     General Comments: Able to provide background information and level of function prior to femur fracture and how it has changed.     General Comments  Multiple covered wounds noted during evaluation. Right forearm proximal to elbow presented with swelling. Pt reports that it was from the IV.            Home Living Family/patient expects to be discharged to:: Skilled nursing facility Living Arrangements: Other relatives Available Help at Discharge: Family;Available 24 hours/day (Brother is unable to provide physical assist. Recent back surgery and walks with a cane) Type of Home: Mobile home Home Access: Ramped entrance     Home Layout: One level     Bathroom Shower/Tub: Tub/shower unit;Walk-in shower   Bathroom Toilet: Handicapped height     Home Equipment: Agricultural consultant (2 wheels);Shower seat;Wheelchair - manual;BSC/3in1;Hospital bed;Other (comment) (hoyer lift, air mattress)          Prior Functioning/Environment Prior Level of Function : Needs assist    Mobility Comments: non-ambulatory and w/c bound at baseline. Prior to femur fx, pt reports that he was able to complete scoot/squat pivot transfers to w/c/BSC without physical assist. He fell and missed the wheelchair 03/2023 sustaining a left femur fracture and has not been able to tolerate getting out of bed d/t severe pain. ADLs Comments: Reports that prior to his falling and sustaining the left femur fx, pt was able to complete all  bathing, dressing and functional transfers himself. Since April, he has only been spongebathing. He has required assist for all BADL tasks. Wears  adult briefs for incontinence.        OT Problem List: Decreased strength;Decreased range of motion;Decreased activity tolerance;Impaired balance (sitting and/or standing);Decreased safety awareness;Decreased knowledge of precautions;Pain;Impaired UE functional use;Decreased coordination      OT Treatment/Interventions: Self-care/ADL training;Therapeutic exercise;Therapeutic activities;Energy conservation;Patient/family education;DME and/or AE instruction;Manual therapy;Balance training;Modalities    OT Goals(Current goals can be found in the care plan section) Acute Rehab OT Goals Patient Stated Goal: to decrease pain OT Goal Formulation: Patient unable to participate in goal setting Time For Goal Achievement: 07/14/23 Potential to Achieve Goals: Fair  OT Frequency: Min 2X/week    Co-evaluation PT/OT/SLP Co-Evaluation/Treatment: Yes Reason for Co-Treatment: Complexity of the patient's impairments (multi-system involvement);For patient/therapist safety;To address functional/ADL transfers   OT goals addressed during session: ADL's and self-care;Proper use of Adaptive equipment and DME;Strengthening/ROM      AM-PAC OT "6 Clicks" Daily Activity     Outcome Measure Help from another person eating meals?: A Little Help from another person taking care of personal grooming?: A Little Help from another person toileting, which includes using toliet, bedpan, or urinal?: Total Help from another person bathing (including washing, rinsing, drying)?: Total Help from another person to put on and taking off regular upper body clothing?: A Lot Help from another person to put on and taking off regular lower body clothing?: Total 6 Click Score: 11   End of Session Equipment Utilized During Treatment: Other (comment) (maxi move (handle to allow pt to assist with lateral scoot)) Nurse Communication: Mobility status  Activity Tolerance: Patient limited by pain;No increased pain Patient left: in  bed;with call bell/phone within reach;with family/visitor present  OT Visit Diagnosis: Muscle weakness (generalized) (M62.81);History of falling (Z91.81);Pain Pain - Right/Left:  (bilateral) Pain - part of body: Leg (sacral region)                Time: 1610-9604 OT Time Calculation (min): 50 min Charges:  OT General Charges $OT Visit: 1 Visit OT Evaluation $OT Eval High Complexity: 1 High OT Treatments $Self Care/Home Management : 8-22 mins  Limmie Patricia, OTR/L,CBIS  Supplemental OT - [facility] and [facility] Secure Chat Preferred    Feleshia Zundel, Charisse March 06/30/2023, 11:56 AM

## 2023-06-30 NOTE — TOC CM/SW Note (Signed)
Sent messages to [place] with [facility] and Elita Quick with [facility] regarding patient being admitted to hospital.   Consults for home health, DME and SNF.   Await PT/OT evaluations.

## 2023-06-30 NOTE — Evaluation (Signed)
Physical Therapy Evaluation Patient Details Name: [name] MRN: [id number] DOB: 06-13-1956 Today's Date: 06/30/2023  History of Present Illness  67 yo male with PMH of hypertension, gastric ulcers, severe deconditioning, left femoral fracture s/p IMN, previously admitted and treated for right hip osteomyelitis.  Discharged with home health on 7/8, returned on 7/9.  Clinical Impression   Pt admitted with the above diagnosis. Pt currently with functional limitations due to the deficits listed below (see OT Problem List). Pt reports a significant change in functional performance since fracturing his left femur in April 2024. Prior to fx, pt was able to complete all BADL tasks and functional transfers at Mod I level. Brother did not need to provide any physical assist. Since fracturing his femur, pt has experienced severe pain and has not been able to tolerate any OOB activity. All movement and mobility activities increases pt's pain. Brother is unable to provide any physical assist d/t recent back surgery.  Agree with SNF, including getting PT services at SNF to maximize independence and safety with mobility and ADLs, and PT can facilitate getting an appropriate cushion for pressure redistribution in sitting in his wheelchair; Pt currently with functional limitations due to the deficits listed below (see PT Problem List). Pt will benefit from skilled PT to increase their independence and safety with mobility to allow discharge to the venue listed below.       Noted also considering [facility], which is also a solid option for DC venue if pt qualifies      Assistance Recommended at Discharge Frequent or constant Supervision/Assistance  If plan is discharge home, recommend the following:  Can travel by private vehicle  Assist for transportation;Help with stairs or ramp for entrance;Other (comment);Two people to help with walking and/or transfers;A lot of help with walking and/or transfers   No     Equipment Recommendations Other (comment) (Pretty well-equipped overall; will need a specialty wc cushion to meet pressure redistribution needs due to decubitus ulcer)  Recommendations for Other Services       Functional Status Assessment Patient has had a recent decline in their functional status and/or demonstrates limited ability to make significant improvements in function in a reasonable and predictable amount of time     Precautions / Restrictions Precautions Precautions: Fall Precaution Comments: fell in 03/2023 with L femur fx (missed w/c during transfer), 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 Bed features used to assist with boost. Pt able to hold onto bed rail with LUE and assist. total AX2 provided. VC provided during bed mobility for compensatory techniques and sequencing. Pt assisted with completing lateral supine scoot from regular bed towards left onto air mattress. Maxi move handle used to allow pt to pull trunk forward while 3 people used hospital pad to scoot pt over. 1 person assisted with BLE. Patient Response: Flat affect, Cooperative  Transfers                        Ambulation/Gait                  Stairs  Wheelchair Mobility     Tilt Bed Tilt Bed Patient Response: Flat affect, Cooperative  Modified Rankin (Stroke Patients Only)       Balance Overall balance assessment: History of Falls (Eval completed at bed level. Unable to assess sitting balance d/t time constraint.)                                           Pertinent Vitals/Pain Pain  Assessment Pain Assessment: 0-10 Pain Score: 10-Worst pain ever Pain Location: Buttocks, BLE during any movement Pain Descriptors / Indicators: Constant, Grimacing, Discomfort, Spasm Pain Intervention(s): Limited activity within patient's tolerance (Moved to air mattress bed during session)    Home Living Family/patient expects to be discharged to:: Skilled nursing facility Living Arrangements: Other relatives Available Help at Discharge: Family;Available 24 hours/day (Brother is unable to provide physical assist. Recent back surgery and walks with a cane) Type of Home: Mobile home Home Access: Ramped entrance       Home Layout: One level Home Equipment: Agricultural consultant (2 wheels);Shower seat;Wheelchair - manual;BSC/3in1;Hospital bed;Other (comment) (hoyer lift, air mattress)      Prior Function Prior Level of Function : Needs assist             Mobility Comments: non-ambulatory and w/c bound at baseline. Prior to femur fx, pt reports that he was able to complete scoot/squat pivot transfers to w/c/BSC without physical assist. He fell and missed the wheelchair 03/2023 sustaining a left femur fracture and has not been able to tolerate getting out of bed d/t severe pain. ADLs Comments: Reports that prior to his falling and sustaining the left femur fx, pt was able to complete all bathing, dressing and functional transfers himself. Since April, he has only been spongebathing. He has required assist for all BADL tasks. Wears adult briefs for incontinence.     Hand Dominance   Dominant Hand: Right    Extremity/Trunk Assessment   Upper Extremity Assessment Upper Extremity Assessment: Defer to OT evaluation RUE Deficits / Details: A/ROM [facility]. Limited er. MMT: shoulder flexion/abduction/IR/er: 4/5, elbow flexion: 5/5, elbow extension: 4/5. weak gross grasp although functional as he was able to hold onto maxi move bar and bed rails to assist with mobility. RUE Coordination: decreased fine  motor LUE Deficits / Details: A/ROM WFL. Limited er. MMT: shoulder flexion/abduction/IR/er: 4/5, elbow flexion: 5/5, elbow extension: 4/5. weak gross grasp although functional as he was able to hold onto maxi move bar and bed rails to assist with mobility. LUE Coordination: decreased fine motor    Lower Extremity Assessment Lower Extremity Assessment: Defer to PT evaluation RLE Deficits / Details: Able to extend at hip/knee to -20 extension (20 flexion); +spasms and cries out in pain with any movement; donned Prevalon boot LLE Deficits / Details: Able to extend at hip/knee to -20 extension (20 flexion); +spasms and cries out in pain with any movement; donned Prevalon boot    Cervical / Trunk Assessment Cervical / Trunk Assessment: Kyphotic  Communication   Communication: No difficulties  Cognition Arousal/Alertness: Awake/alert Behavior During Therapy: WFL for tasks assessed/performed Overall Cognitive Status: Within Functional Limits for tasks assessed                                 General Comments: Able to provide background information and level of function prior to femur  fracture and how it has changed.        General Comments General comments (skin integrity, edema, etc.): Multiple covered wounds noted during evaluation. Right forearm proximal to elbow presented with swelling. Pt reports that it was from the IV.    Exercises     Assessment/Plan    PT Assessment Patient needs continued PT services  PT Problem List Decreased strength;Decreased range of motion;Decreased activity tolerance;Decreased balance;Decreased mobility;Decreased cognition;Decreased knowledge of use of DME;Impaired sensation;Impaired tone;Pain;Decreased skin integrity       PT Treatment Interventions DME instruction;Functional mobility training;Therapeutic activities;Therapeutic exercise;Balance training;Cognitive remediation;Patient/family education;Wheelchair mobility training    PT  Goals (Current goals can be found in the Care Plan section)  Acute Rehab PT Goals Patient Stated Goal: be able to get in/out of his wheelchair again PT Goal Formulation: With patient Time For Goal Achievement: 07/14/23 Potential to Achieve Goals: Poor    Frequency Min 2X/week     Co-evaluation PT/OT/SLP Co-Evaluation/Treatment: Yes Reason for Co-Treatment: Complexity of the patient's impairments (multi-system involvement);For patient/therapist safety;To address functional/ADL transfers PT goals addressed during session: Mobility/safety with mobility OT goals addressed during session: ADL's and self-care;Proper use of Adaptive equipment and DME;Strengthening/ROM       AM-PAC PT "6 Clicks" Mobility  Outcome Measure Help needed turning from your back to your side while in a flat bed without using bedrails?: Total Help needed moving from lying on your back to sitting on the side of a flat bed without using bedrails?: Total Help needed moving to and from a bed to a chair (including a wheelchair)?: Total Help needed standing up from a chair using your arms (e.g., wheelchair or bedside chair)?: Total Help needed to walk in hospital room?: Total Help needed climbing 3-5 steps with a railing? : Total 6 Click Score: 6    End of Session Equipment Utilized During Treatment: Other (comment) (bed pads; used Maximove bar for pt to pull like an overhead trapeze) Activity Tolerance: Patient tolerated treatment well;Patient limited by pain Patient left: in bed;with call bell/phone within reach;with family/visitor present Nurse Communication: Need for lift equipment PT Visit Diagnosis: Muscle weakness (generalized) (M62.81);History of falling (Z91.81);Difficulty in walking, not elsewhere classified (R26.2)    Time: 4098-1191 PT Time Calculation (min) (ACUTE ONLY): 50 min   Charges:   PT Evaluation $PT Eval Moderate Complexity: 1 Mod   PT General Charges $$ ACUTE PT VISIT: 1 Visit          Van Clines, PT  Acute Rehabilitation Services Office [phone] Secure Chat welcomed   Levi Aland 06/30/2023, 3:26 PM

## 2023-06-30 NOTE — [place] FL2 (Signed)
[place] MEDICAID FL2 LEVEL OF CARE FORM     IDENTIFICATION  Patient Name: [name] Birthdate: 1956-08-09 Sex: male Admission Date (Current Location): 06/29/2023  [facility] and [place] Number:  Producer, television/film/video and Address:  [facility] [place], [address] [place], [place] [address]      Provider Number: [id number]  Attending Physician Name and Address:  Tyson Alias, *  Relative Name and Phone Number:       Current Level of Care: Hospital Recommended Level of Care: Skilled Nursing Facility Prior Approval Number:    Date Approved/Denied:   PASRR Number: [id number]  Discharge Plan: SNF    Current Diagnoses: Patient Active Problem List   Diagnosis Date Noted   Osteomyelitis of right femur (HCC) 06/30/2023   Infected decubitus ulcer 06/29/2023   Decubitus ulcer of right hip, unstageable (HCC) 06/19/2023   Secondary hypertension 05/06/2023   Acute upper GI bleed 05/05/2023   Paraplegia (HCC) 05/04/2023   Protein-calorie malnutrition, severe 05/02/2023   Decubitus ulcer of sacral region, unstageable (HCC) 05/01/2023   Acute esophagitis 05/01/2023   Gastric ulcer 05/01/2023   Duodenal ulcer 05/01/2023   Acute GI bleeding 04/30/2023   Acute blood loss anemia 04/30/2023   Melena 04/30/2023   History of gastric ulcer 04/30/2023   AKI (acute kidney injury) (HCC) 03/27/2023   Traumatic rhabdomyolysis (HCC) 03/27/2023   Closed femur fracture (HCC) 03/26/2023   Malnutrition of moderate degree 09/11/2020   Loss of weight 09/07/2020   Candidal intertrigo 09/07/2020   Fall at home 09/06/2020   Hypoglycemia 07/31/2020   Purulent Cellulitis of left hip 07/15/2020   Recurrent falls 07/15/2020   Frailty syndrome in geriatric patient 07/15/2020   Physical deconditioning 07/15/2020   Asymptomatic bacteriuria 07/15/2020   Diabetes mellitus (HCC) 01/28/2012    Orientation RESPIRATION BLADDER Height & Weight     Self, Time, Situation,  Place  Normal Incontinent Weight: 182 lb 1.6 oz (82.6 kg) Height:  5\' 10"  (177.8 cm)  BEHAVIORAL SYMPTOMS/MOOD NEUROLOGICAL BOWEL NUTRITION STATUS      Incontinent Diet (regular)  AMBULATORY STATUS COMMUNICATION OF NEEDS Skin   Extensive Assist Verbally PU Stage and Appropriate Care PU Stage 1 Dressing:  (unstageable wound, right foot and heel: foam dressing, changed daily) PU Stage 2 Dressing:  (buttocks: foam dressing and wet to dry, change 2x/day; left hip: foam dressing, lift to assess every shift and change every 3 days; lumbar: foam dressing, change 2x/day)   PU Stage 4 Dressing: Daily (left heel, foam dressing)               Personal Care Assistance Level of Assistance  Bathing, Feeding, Dressing Bathing Assistance: Maximum assistance Feeding assistance: Limited assistance Dressing Assistance: Maximum assistance     Functional Limitations Info  Sight, Hearing Sight Info: Impaired Hearing Info: Impaired      SPECIAL CARE FACTORS FREQUENCY  PT (By licensed PT), OT (By licensed OT)     PT Frequency: 5x/wk OT Frequency: 5x/wk            Contractures Contractures Info: Not present    Additional Factors Info  Code Status, Allergies Code Status Info: Full Allergies Info: Lipitor           Current Medications (06/30/2023):  This is the current hospital active medication list Current Facility-Administered Medications  Medication Dose Route Frequency Provider Last Rate Last Admin   acetaminophen (TYLENOL) tablet 1,000 mg  1,000 mg Oral Q8H Tawkaliyar, Roya, DO  1,000 mg at 06/30/23 1507   cefTRIAXone (ROCEPHIN) 2 g in sodium chloride 0.9 % 100 mL IVPB  2 g Intravenous Q24H Leander Rams, RPH 200 mL/hr at 06/30/23 1031 2 g at 06/30/23 1031   Chlorhexidine Gluconate Cloth 2 % PADS 6 each  6 each Topical Q0600 Tyson Alias, MD   6 each at 06/30/23 1032   collagenase (SANTYL) ointment   Topical Daily Tyson Alias, MD   Given at 06/30/23 1032    DAPTOmycin (CUBICIN) 650 mg in sodium chloride 0.9 % IVPB  8 mg/kg Intravenous Q1400 Leander Rams, RPH 126 mL/hr at 06/30/23 1508 650 mg at 06/30/23 1508   enoxaparin (LOVENOX) injection 40 mg  40 mg Subcutaneous Daily Nooruddin, Saad, MD   40 mg at 06/30/23 1610   gabapentin (NEURONTIN) capsule 800 mg  800 mg Oral TID Nooruddin, Jason Fila, MD   800 mg at 06/30/23 1507   LORazepam (ATIVAN) tablet 0.5 mg  0.5 mg Oral Q8H PRN Nooruddin, Saad, MD       losartan (COZAAR) tablet 25 mg  25 mg Oral Daily Nooruddin, Saad, MD   25 mg at 06/30/23 9604   methocarbamol (ROBAXIN) tablet 500 mg  500 mg Oral TID Nooruddin, Jason Fila, MD   500 mg at 06/30/23 1508   mirtazapine (REMERON SOL-TAB) disintegrating tablet 15 mg  15 mg Oral QHS Nooruddin, Saad, MD       multivitamin with minerals tablet 1 tablet  1 tablet Oral Daily Nooruddin, Saad, MD   1 tablet at 06/30/23 0813   nutrition supplement (JUVEN) (JUVEN) powder packet 1 packet  1 packet Oral BID BM Nooruddin, Saad, MD   1 packet at 06/30/23 1508   oxyCODONE (Oxy IR/ROXICODONE) immediate release tablet 5 mg  5 mg Oral Q3H PRN Nooruddin, Saad, MD   5 mg at 06/30/23 0440   pantoprazole (PROTONIX) EC tablet 40 mg  40 mg Oral BID Tawkaliyar, Roya, DO   40 mg at 06/30/23 0814   senna-docusate (Senokot-S) tablet 1 tablet  1 tablet Oral QHS Nooruddin, Jason Fila, MD   1 tablet at 06/29/23 2318     Discharge Medications: Please see discharge summary for a list of discharge medications.  Relevant Imaging Results:  Relevant Lab Results:   Additional Information SS#: [id number]  Baldemar Lenis, LCSW

## 2023-06-30 NOTE — ED Notes (Signed)
ED TO INPATIENT HANDOFF REPORT  ED Nurse Name and Phone #: Dahlia Client [phone]  S Name/Age/Gender [name] 67 y.o. male Room/Bed: 001C/001C  Code Status   Code Status: Full Code  Home/SNF/Other Home -recently discharged from facility  Patient oriented to: self, place, time, and situation Is this baseline? Yes   Triage Complete: Triage complete  Chief Complaint Infected decubitus ulcer [L89.90, L08.9]  Triage Note No notes on file   Allergies Allergies  Allergen Reactions   Lipitor [Atorvastatin Calcium] Other (See Comments)    Myalgias     Level of Care/Admitting Diagnosis ED Disposition     ED Disposition  Admit   Condition  --   Comment  Hospital Area: [facility] [[id number]]  Level of Care: Med-Surg [16]  May place patient in observation at [facility] or [facility] if equivalent level of care is available:: Yes  Covid Evaluation: Confirmed COVID Negative  Diagnosis: Infected decubitus ulcer [696295]  Admitting Physician: Tyson Alias [[id number]]  Attending Physician: Tyson Alias [[id number]]          B Medical/Surgery History Past Medical History:  Diagnosis Date   Cellulitis 07/17/2020   LEFT HIP   Diabetes mellitus    Dyspnea    Hyperlipidemia    Hypertension    Past Surgical History:  Procedure Laterality Date   BIOPSY  05/01/2023   Procedure: BIOPSY;  Surgeon: Beverley Fiedler, MD;  Location: [facility] END[facility]COPY;  Service: Gastroenterology;;   ESOPHAGOGASTRODUODENOSCOPY (EGD) WITH PROPOFOL N/A 05/01/2023   Procedure: ESOPHAGOGASTRODUODENOSCOPY (EGD) WITH PROPOFOL;  Surgeon: Beverley Fiedler, MD;  Location: [facility] ENDOSCOPY;  Service: Gastroenterology;  Laterality: N/A;   FEMUR IM NAIL Left 03/27/2023   Procedure: INTRAMEDULLARY (IM) RETROGRADE FEMORAL NAILING;  Surgeon: Myrene Galas, MD;  Location: [facility] OR;  Service: Orthopedics;  Laterality: Left;   SPINE SURGERY       A IV Location/Drains/Wounds Patient  Lines/Drains/Airways Status     Active Line/Drains/Airways     Name Placement date Placement time Site Days   PICC Single Lumen 06/24/23 Left Basilic 44 cm 0 cm 06/24/23  5366  Basilic  6   Pressure Injury 44/03/47 Hip Anterior;Left;Proximal Stage 2 -  Partial thickness loss of dermis presenting as a shallow open injury with a red, pink wound bed without slough. close to R hip 04/30/23  2200  -- 61   Pressure Injury 04/30/23 Hip Anterior;Proximal;Right Stage 2 -  Partial thickness loss of dermis presenting as a shallow open injury with a red, pink wound bed without slough. 1/2 cm by 0.8cm by 0.2 cm 04/30/23  2200  -- 61   Pressure Injury 04/30/23 Heel Right;Left Stage 1 -  Intact skin with non-blanchable redness of a localized area usually over a bony prominence. soft pressure on heels bilaterally foams applied 04/30/23  2200  -- 61   Pressure Injury 06/19/23 Buttocks Right;Upper Unstageable - Full thickness tissue loss in which the base of the injury is covered by slough (yellow, tan, gray, green or brown) and/or eschar (tan, brown or black) in the wound bed. 06/19/23  1408  -- 11   Pressure Injury 06/19/23 Buttocks Right Unstageable - Full thickness tissue loss in which the base of the injury is covered by slough (yellow, tan, gray, green or brown) and/or eschar (tan, brown or black) in the wound bed. 06/19/23  1408  -- 11   Pressure Injury 06/19/23 Buttocks Right Stage 2 -  Partial thickness loss of dermis presenting as a  shallow open injury with a red, pink wound bed without slough. 06/19/23  1408  -- 11   Pressure Injury 06/19/23 Hip Left;Lateral Stage 2 -  Partial thickness loss of dermis presenting as a shallow open injury with a red, pink wound bed without slough. 06/19/23  1408  -- 11   Pressure Injury 06/19/23 Lumbar Lateral;Right Stage 2 -  Partial thickness loss of dermis presenting as a shallow open injury with a red, pink wound bed without slough. 06/19/23  1408  -- 11   Pressure Injury  06/19/23 Heel Left;Lateral Stage 4 - Full thickness tissue loss with exposed bone, tendon or muscle. 06/19/23  1408  -- 11   Pressure Injury 06/19/23 Foot Right;Lateral Unstageable - Full thickness tissue loss in which the base of the injury is covered by slough (yellow, tan, gray, green or brown) and/or eschar (tan, brown or black) in the wound bed. 06/19/23  1408  -- 11   Pressure Injury 06/19/23 Heel Right;Lateral Unstageable - Full thickness tissue loss in which the base of the injury is covered by slough (yellow, tan, gray, green or brown) and/or eschar (tan, brown or black) in the wound bed. 06/19/23  1408  -- 11   Pressure Injury 06/19/23 Heel Left Unstageable - Full thickness tissue loss in which the base of the injury is covered by slough (yellow, tan, gray, green or brown) and/or eschar (tan, brown or black) in the wound bed. 06/19/23  1408  -- 11   Wound / Incision (Open or Dehisced) 08/02/20 Sacrum Medial 08/02/20  1458  Sacrum  1062   Wound / Incision (Open or Dehisced) 08/03/20 Hip Left 08/03/20  1513  Hip  1061            Intake/Output Last 24 hours No intake or output data in the 24 hours ending 06/30/23 0252  Labs/Imaging Results for orders placed or performed during the hospital encounter of 06/29/23 (from the past 48 hour(s))  Basic metabolic panel     Status: Abnormal   Collection Time: 06/29/23 10:42 PM  Result Value Ref Range   Sodium 137 135 - 145 mmol/L   Potassium 4.7 3.5 - 5.1 mmol/L    Comment: HEMOLYSIS AT THIS LEVEL MAY AFFECT RESULT   Chloride 107 98 - 111 mmol/L   CO2 18 (L) 22 - 32 mmol/L   Glucose, Bld 109 (H) 70 - 99 mg/dL    Comment: Glucose reference range applies only to samples taken after fasting for at least 8 hours.   BUN 18 8 - 23 mg/dL   Creatinine, Ser 1.61 0.61 - 1.24 mg/dL   Calcium 8.5 (L) 8.9 - 10.3 mg/dL   GFR, Estimated >09 >60 mL/min    Comment: (NOTE) Calculated using the CKD-EPI Creatinine Equation (2021)    Anion gap 12 5 - 15     Comment: Performed at [facility], [address], [place], [place] [address]  Basic metabolic panel     Status: Abnormal   Collection Time: 06/30/23 12:28 AM  Result Value Ref Range   Sodium 135 135 - 145 mmol/L   Potassium 3.8 3.5 - 5.1 mmol/L   Chloride 103 98 - 111 mmol/L   CO2 24 22 - 32 mmol/L   Glucose, Bld 123 (H) 70 - 99 mg/dL    Comment: Glucose reference range applies only to samples taken after fasting for at least 8 hours.   BUN 14 8 - 23 mg/dL   Creatinine, Ser 8.11 0.61 -  1.24 mg/dL   Calcium 8.3 (L) 8.9 - 10.3 mg/dL   GFR, Estimated >62 >13 mL/min    Comment: (NOTE) Calculated using the CKD-EPI Creatinine Equation (2021)    Anion gap 8 5 - 15    Comment: Performed at [facility], [address] [place], [place] [address]  CBC     Status: Abnormal   Collection Time: 06/30/23 12:28 AM  Result Value Ref Range   WBC 10.2 4.0 - 10.5 K/uL   RBC 3.27 (L) 4.22 - 5.81 MIL/uL   Hemoglobin 9.2 (L) 13.0 - 17.0 g/dL   HCT 84.6 (L) 96.2 - 95.2 %   MCV 92.0 80.0 - 100.0 fL   MCH 28.1 26.0 - 34.0 pg   MCHC 30.6 30.0 - 36.0 g/dL   RDW 84.1 32.4 - 40.1 %   Platelets 426 (H) 150 - 400 K/uL   nRBC 0.0 0.0 - 0.2 %    Comment: Performed at [facility], [address] [place], [place] [address]   No results found.  Pending Labs Unresulted Labs (From admission, onward)    None       Vitals/Pain Today's Vitals   06/30/23 0030 06/30/23 0104 06/30/23 0140 06/30/23 0217  BP: (!) 143/65  (!) 144/68   Pulse: (!) 101  93   Resp: (!) 24  20   Temp:   98.4 F (36.9 C)   TempSrc:   Oral   SpO2: 93%  98%   Weight:      Height:      PainSc:  3   3     Isolation Precautions No active isolations  Medications Medications  cefTRIAXone (ROCEPHIN) 2 g in sodium chloride 0.9 % 100 mL IVPB (has no administration in time range)  DAPTOmycin (CUBICIN) 650 mg in sodium chloride 0.9 % IVPB (has no administration in time range)  enoxaparin (LOVENOX)  injection 40 mg (has no administration in time range)  oxyCODONE (Oxy IR/ROXICODONE) immediate release tablet 5 mg (5 mg Oral Given 06/29/23 2318)  senna-docusate (Senokot-S) tablet 1 tablet (1 tablet Oral Given 06/29/23 2318)  LORazepam (ATIVAN) tablet 0.5 mg (has no administration in time range)  losartan (COZAAR) tablet 25 mg (has no administration in time range)  methocarbamol (ROBAXIN) tablet 500 mg (500 mg Oral Given 06/29/23 2318)  gabapentin (NEURONTIN) capsule 800 mg (800 mg Oral Given 06/29/23 2318)  mirtazapine (REMERON SOL-TAB) disintegrating tablet 15 mg (15 mg Oral Not Given 06/29/23 2319)  multivitamin with minerals tablet 1 tablet (has no administration in time range)  nutrition supplement (JUVEN) (JUVEN) powder packet 1 packet (has no administration in time range)  acetaminophen (TYLENOL) tablet 1,000 mg (1,000 mg Oral Not Given 06/29/23 2341)  pantoprazole (PROTONIX) EC tablet 40 mg (has no administration in time range)    Mobility non-ambulatory     Focused Assessments     R Recommendations: See Admitting Provider Note  Report given to:   Additional Notes: Recently changed the dressing on Pt's buttocks.  Wet to dry dressing reapplied.  Tunneling noted. Pt had other dressings, but did not appear to need to be changed.

## 2023-06-30 NOTE — TOC Initial Note (Addendum)
Transition of Care (TOC) - Initial/Assessment Note  Patient discharged yesterday to brother's home with IV [facility] and home health [facility] . Both agencies aware patient readmitted.  Received consult for LTAC. Ad[facility] La[facility] with [facility] reviewed chart and suggested LTAC to [place] .   Spoke to patient and brother at bedisde. Explained LTAC and offered choice. They would like referrals sent to both [facility] and [facility] . They understand patient would have to meet criteria and LTAc would need to submit for insurance to approve.   Referral sent to [facility] with [facility] . Referral sent to [facility] with [facility]. Both will review chart and follow up with NCM   Heard back from both [place] , patient does not have the revenue codes for LTAC. Attending team aware. NCM explained to patient and his brother. Patient states he cannot go home due to not having assistance at home. He is requesting SNF. Explained will need PT recommendations and note to submit to insurance for approval. If Insurance approves rehab at a SNF it is short term only not long term. Patient and brother voiced understanding  Patient Details  Name: [name] MRN: [id number] Date of Birth: June 02, 1956  Transition [facility]) CM/SW Contact:    Kingsley Plan, RN Phone Number: 06/30/2023, 12:47 PM  Clinical Narrative:                   Expected Discharge Plan: Long Term Acute Care (LTAC)     Patient Goals and CMS Choice            Expected Discharge Plan and Services                                              Prior Living Arrangements/Services   Lives with:: Siblings Patient language and need for interpreter reviewed:: Yes Do you feel safe going back to the place where you live?: No      Need for Family Participation in Patient Care: Yes (Comment) Care giver support system in place?: No (comment)   Criminal Activity/Legal Involvement Pertinent to Current Situation/Hospitalization: No - Comment as  needed  Activities of Daily Living Home Assistive Devices/Equipment: Wheelchair ADL Screening (condition at time of admission) Patient's cognitive ability adequate to safely complete daily activities?: Yes Does the patient have difficulty seeing, even when wearing glasses/contacts?: No Does the patient have difficulty concentrating, remembering, or making decisions?: No Patient able to express need for assistance with ADLs?: Yes Does the patient have difficulty dressing or bathing?: Yes Independently performs ADLs?: No Communication: Independent Dressing (OT): Dependent Is this a change from baseline?: Pre-admission baseline Grooming: Dependent Is this a change from baseline?: Pre-admission baseline Feeding: Independent Bathing: Dependent Is this a change from baseline?: Pre-admission baseline Toileting: Dependent Is this a change from baseline?: Pre-admission baseline In/Out Bed: Dependent Is this a change from baseline?: Pre-admission baseline Walks in Home: Dependent Is this a change from baseline?: Pre-admission baseline Does the patient have difficulty walking or climbing stairs?: Yes Weakness of Legs: Both Weakness of Arms/Hands: Both  Permission Sought/Granted   Permission granted to share information with : Yes, Verbal Permission Granted  Share Information with NAME: brother           Emotional Assessment Appearance:: Appears stated age     Orientation: : Oriented to Self, Oriented to Place, Oriented to  Time, Oriented  to Situation Alcohol / Substance Use: Not Applicable Psych Involvement: No (comment)  Admission diagnosis:  Infected decubitus ulcer [L89.90, L08.9] Osteomyelitis, unspecified site, unspecified type [facility]) [M86.9] Patient Active Problem List   Diagnosis Date Noted   Osteomyelitis of right femur (HCC) 06/30/2023   Infected decubitus ulcer 06/29/2023   Decubitus ulcer of right hip, unstageable (HCC) 06/19/2023   Secondary hypertension 05/06/2023    Acute upper GI bleed 05/05/2023   Paraplegia (HCC) 05/04/2023   Protein-calorie malnutrition, severe 05/02/2023   Decubitus ulcer of sacral region, unstageable (HCC) 05/01/2023   Acute esophagitis 05/01/2023   Gastric ulcer 05/01/2023   Duodenal ulcer 05/01/2023   Acute GI bleeding 04/30/2023   Acute blood loss anemia 04/30/2023   Melena 04/30/2023   History of gastric ulcer 04/30/2023   AKI (acute kidney injury) (HCC) 03/27/2023   Traumatic rhabdomyolysis (HCC) 03/27/2023   Closed femur fracture (HCC) 03/26/2023   Malnutrition of moderate degree 09/11/2020   Loss of weight 09/07/2020   Candidal intertrigo 09/07/2020   Fall at home 09/06/2020   Hypoglycemia 07/31/2020   Purulent Cellulitis of left hip 07/15/2020   Recurrent falls 07/15/2020   Frailty syndrome in geriatric patient 07/15/2020   Physical deconditioning 07/15/2020   Asymptomatic bacteriuria 07/15/2020   Diabetes mellitus (HCC) 01/28/2012   PCP:  Pcp, No Pharmacy:   [id number] [place], [place] - [address] [place] [address] Phone: [phone] Fax: [phone]  [facility] [address] [place] [address] Phone: [phone] Fax: [phone]     Social Determinants of Health (SDOH) Social History: SDOH Screenings   Food Insecurity: No Food Insecurity (06/30/2023)  Recent Concern: Food Insecurity - Food Insecurity Present (06/24/2023)  Housing: Low Risk  (06/30/2023)  Transportation Needs: No Transportation Needs (06/30/2023)  Utilities: Not At Risk (06/30/2023)  Tobacco Use: High Risk (06/29/2023)   SDOH Interventions:     Readmission Risk Interventions    06/22/2023    4:22 PM 05/04/2023    1:50 PM  Readmission Risk Prevention Plan  Transportation Screening Complete Complete  PCP or Specialist Appt within 5-7 Days  Complete  PCP or Specialist Appt within 3-5 Days Complete   Home Care Screening  Complete  Medication  Review (RN CM)  Referral to Pharmacy  HRI or Home Care Consult Complete   Social Work Consult for Recovery Care Planning/Counseling Complete   Palliative Care Screening Complete   Medication Review Oceanographer) Complete

## 2023-06-30 NOTE — Progress Notes (Signed)
Subjective: Patient laying in bed, says he is still experiencing significant leg pain, unchanged from previously.  Says that when he returned home yesterday he felt a lot of pain and his brother was unable to take care of him, so he returned to the hospital.  He says he is open to going to a long-term care facility now.  No new acute complaints.  Objective:  Vital signs in last 24 hours: Vitals:   06/30/23 0400 06/30/23 0502 06/30/23 0541 06/30/23 0755  BP: (!) 123/40  (!) 166/106 117/63  Pulse: 99  72 (!) 109  Resp: 17  18 18   Temp: 97.7 F (36.5 C)   98.5 F (36.9 C)  TempSrc: Oral   Oral  SpO2: 94%  99% 100%  Weight:  82.6 kg    Height:  5\' 10"  (1.778 m)     Physical Exam: Constitutional: Lying in bed, no acute distress MSK: Some mild diffuse swelling noted in the right upper extremity; distal pulses intact.  Some numbness reported but not evident on neuro exam.  Arm swelling looks slightly improved from last exam. Skin: PICC line noted in left upper extremity; dressing on right hip decubitus ulcer dry, no erythema or discharge noted  Labs: No new labs  Assessment/Plan:  Principal Problem:   Infected decubitus ulcer Active Problems:   Diabetes mellitus (HCC)   Protein-calorie malnutrition, severe   Osteomyelitis of right femur [facility])   Patient Summary: [name] is a 67 yo male with PMH of hypertension, gastric ulcers, severe deconditioning, left femoral fracture s/p IMN, previously admitted and treated for right hip osteomyelitis.  Discharged with home health on 7/8, returned on 7/9 because he did not feel he was getting adequate care at home.  Osteomyelitis of the right greater trochanter 2/2 infected decubitus ulcer of the right lateral hip Bilateral lower extremity weakness and contractures Poor nutrition status Patient is afebrile and does not report any signs of systemic infection.  He was receiving daily hydrotherapy and dressing changes by nursing, which we  will continue.  Patient states that after he was discharged yesterday, he went home and was in significant pain. He said his brother was unable to take care of him, so he returned to the hospital. He is now open to going to a long-term care facility.  Wound on right hip had dry dressing; no discoloration or drainage was noted.  -Continue daptomycin and ceftriaxone, ending on 07/31/2023 -CK, CBC with differential, CMP, ESR/CRP weekly -Daily hydrotherapy and dressing changes -Continue home pain control regimen: Tylenol 1000 mg q8h, oxycodone 5 mg q3h PRN, gabapentin 800 mg TID, robaxin 500 mg TID  -PT/OT eval - Nutrition supplementation  Social determinants of health, disposition planning Patient was discharged on 7/8 to home with home health, and stated that his brother would be able to help take care of him.  We had recommended a long-term care facility, but he declined based on past negative experiences.  Upon returning home, he says he experienced pain and his brother was unable to help him, so he returned to the hospital a few hours later.  He now says he is willing to try a long-term care facility.  We are coordinating with social work and case workers to try to come up with an appropriate discharge plan and appreciate their continued assistance.   Diet: Normal IVF: None VTE: Enoxaparin Code: Full Family Update: Called and spoke with his brother, [name].    Dispo: Anticipated discharge to  SNF  pending safe dispo plan.   Annett Fabian, MD 06/30/2023, 2:14 PM Pager: [phone] After 5pm on weekdays and 1pm on weekends: On Call pager [phone]

## 2023-07-01 DIAGNOSIS — M62838 Other muscle spasm: Secondary | ICD-10-CM

## 2023-07-01 DIAGNOSIS — R531 Weakness: Secondary | ICD-10-CM

## 2023-07-01 DIAGNOSIS — M86151 Other acute osteomyelitis, right femur: Secondary | ICD-10-CM

## 2023-07-01 DIAGNOSIS — L89219 Pressure ulcer of right hip, unspecified stage: Secondary | ICD-10-CM

## 2023-07-01 MED ORDER — METHOCARBAMOL 750 MG PO TABS
750.0000 mg | ORAL_TABLET | Freq: Three times a day (TID) | ORAL | Status: DC
  Administered 2023-07-01 – 2023-07-15 (×42): 750 mg via ORAL
  Filled 2023-07-01 (×42): qty 1

## 2023-07-01 NOTE — Progress Notes (Addendum)
Physical Therapy Wound Evaluation/Treatment Patient Details  Name: [name] MRN: [id number] Date of Birth: [birth year]  Today's Date: 07/01/2023 Time: 4098-1191 Time Calculation (min): 51 min  Subjective  Subjective Assessment Subjective: sd Patient and Family Stated Goals: For wound to heal Date of Onset:  (Present on admission) Prior Treatments: bedside debridement and wound therapy prior admission  Pain Score:  Premedicated and tolerated treatment well without complaints of pain.   Wound Assessment  Pressure Injury 06/19/23 Buttocks Right;Upper Unstageable - Full thickness tissue loss in which the base of the injury is covered by slough (yellow, tan, gray, green or brown) and/or eschar (tan, brown or black) in the wound bed. (Active)  Wound Image   07/01/23 1504  Dressing Type Foam - Lift dressing to assess site every shift;Gauze (Comment);Santyl;Barrier Film (skin prep);Moist to moist 07/01/23 1504  Dressing Clean;Intact;New drainage;Changed 07/01/23 1504  Dressing Change Frequency Twice a day 07/01/23 1504  State of Healing Non-healing 07/01/23 1504  Site / Wound Assessment Yellow;Pink 07/01/23 1504  % Wound base Red or Granulating 5% 07/01/23 1504  % Wound base Yellow/Fibrinous Exudate 95% 07/01/23 1504  % Wound base Black/Eschar 0% 07/01/23 1504  % Wound base Other/Granulation Tissue (Comment) 0% 07/01/23 1504  Peri-wound Assessment Intact 07/01/23 1504  Wound Length (cm) 4 cm 07/01/23 1400  Wound Width (cm) 2.1 cm 07/01/23 1400  Wound Depth (cm) 0.3 cm 07/01/23 1400  Wound Surface Area (cm^2) 8.4 cm^2 07/01/23 1400  Wound Volume (cm^3) 2.52 cm^3 07/01/23 1400  Tunneling (cm) 0 07/01/23 1504  Undermining (cm) 0 07/01/23 1504  Margins Unattached edges (unapproximated) 07/01/23 1504  Drainage Amount Minimal 07/01/23 1504  Drainage Description Serosanguineous 07/01/23 1504  Treatment Debridement (Selective);Irrigation;Packing (Saline gauze) 07/01/23 1504     Pressure  Injury 06/19/23 Buttocks Right Unstageable - Full thickness tissue loss in which the base of the injury is covered by slough (yellow, tan, gray, green or brown) and/or eschar (tan, brown or black) in the wound bed. (Active)  Wound Image   07/01/23 1504  Dressing Type Foam - Lift dressing to assess site every shift;Gauze (Comment);Barrier Film (skin prep);Moist to moist 07/01/23 1504  Dressing New drainage;Changed;Clean;Intact 07/01/23 1504  Dressing Change Frequency Twice a day 07/01/23 1504  State of Healing Early/partial granulation 07/01/23 1504  Site / Wound Assessment Yellow;Red;Black 07/01/23 1504  % Wound base Red or Granulating 40% 07/01/23 1504  % Wound base Yellow/Fibrinous Exudate 40% 07/01/23 1504  % Wound base Black/Eschar 20% 07/01/23 1504  % Wound base Other/Granulation Tissue (Comment) 0% 07/01/23 1504  Peri-wound Assessment Pink;Excoriated 07/01/23 1504  Wound Length (cm) 7.1 cm 07/01/23 1400  Wound Width (cm) 8.2 cm 07/01/23 1400  Wound Depth (cm) 1.2 cm 07/01/23 1400  Wound Surface Area (cm^2) 58.22 cm^2 07/01/23 1400  Wound Volume (cm^3) 69.86 cm^3 07/01/23 1400  Tunneling (cm) 0 07/01/23 1504  Undermining (cm) Up to 7.2 cm from 12:00-7:00. 7.2 cm max at 4:00 07/01/23 1400  Margins Unattached edges (unapproximated) 07/01/23 1504  Drainage Amount Moderate 07/01/23 1504  Drainage Description Purulent 07/01/23 1504  Treatment Debridement (Selective);Irrigation;Packing (Saline gauze) 07/01/23 1504      Selective Debridement (non-excisional) Selective Debridement (non-excisional) - Location: right buttocks proximal and distal wound Selective Debridement (non-excisional) - Tools Used: Forceps, Scissors, Scalpel Selective Debridement (non-excisional) - Tissue Removed: Proximal wound mostly adherent but minimal amount of tissue able to be debrided. Distal wound with large amount of yellow and black necrotic tissue in undermining.    Wound Assessment and Plan  Wound Therapy  - Assess/Plan/Recommendations Wound Therapy - Clinical Statement: Pt presents to wound therapy with proximal and distal R buttock wounds that were previously being seen by wound therapy last admission. Debridement mostly able to be completed at distal wound in undermining. This patient will benefit from continued wound therapy for selective removal of unviable tissue, to decrease bioburden, and promote wound bed healing. Wound Therapy - Functional Problem List: immobility, paraplegia Factors Delaying/Impairing Wound Healing: Immobility Hydrotherapy Plan: Debridement, Dressing change, Patient/family education Wound Therapy - Frequency: 2X / week Wound Therapy - Follow Up Recommendations: dressing changes by RN  Wound Therapy Goals- Improve the function of patient's integumentary system by progressing the wound(s) through the phases of wound healing (inflammation - proliferation - remodeling) by: Wound Therapy Goals - Improve the function of patient's integumentary system by progressing the wound(s) through the phases of wound healing by: Decrease Necrotic Tissue to: 20% Decrease Necrotic Tissue - Progress: Goal set today Increase Granulation Tissue to: 80% Increase Granulation Tissue - Progress: Goal set today Goals/treatment plan/discharge plan were made with and agreed upon by patient/family: Yes Time For Goal Achievement: 7 days Wound Therapy - Potential for Goals: Fair  Goals will be updated until maximal potential achieved or discharge criteria met.  Discharge criteria: when goals achieved, discharge from hospital, MD decision/surgical intervention, no progress towards goals, refusal/missing three consecutive treatments without notification or medical reason.  GP     Charges PT Wound Care Charges $Wound Debridement up to 20 cm: < or equal to 20 cm $ Wound Debridement each add'l 20 sqcm: 2 $PT Hydrotherapy Visit: 1 Visit       Marylynn Pearson 07/01/2023, 3:19 PM  Conni Slipper,  PT, DPT Acute Rehabilitation Services Secure Chat Preferred Office: [phone]

## 2023-07-01 NOTE — TOC Progression Note (Signed)
Transition of Care [facility]) - Progression Note    Patient Details  Name: [name] MRN: [id number] Date of Birth: 1956/08/21  Transition of Care [facility]) CM/SW Contact  Baldemar Lenis, [place] Phone Number: 07/01/2023, 3:53 PM  Clinical Narrative:   CSW met with patient to discuss bed offers for SNF. CSW answered questions and discussed options, patient chose [facility]. Patient asked CSW to call his brother and discuss. CSW contacted patient's brother to also discuss bed offers and answer questions, brother in agreement to [facility] as well. CSW confirmed bed availability with [facility] and requested CMA to initiate insurance authorization. CSW to follow.    Expected Discharge Plan: Skilled Nursing Facility Barriers to Discharge: Continued Medical Work up, English as a second language teacher  Expected Discharge Plan and Services                                               Social Determinants of Health (SDOH) Interventions SDOH Screenings   Food Insecurity: No Food Insecurity (06/30/2023)  Recent Concern: Food Insecurity - Food Insecurity Present (06/24/2023)  Housing: Low Risk  (06/30/2023)  Transportation Needs: No Transportation Needs (06/30/2023)  Utilities: Not At Risk (06/30/2023)  Tobacco Use: High Risk (06/29/2023)    Readmission Risk Interventions    06/22/2023    4:22 PM 05/04/2023    1:50 PM  Readmission Risk Prevention Plan  Transportation Screening Complete Complete  PCP or Specialist Appt within 5-7 Days  Complete  PCP or Specialist Appt within 3-5 Days Complete   Home Care Screening  Complete  Medication Review (RN CM)  Referral to Pharmacy  HRI or Home Care Consult Complete   Social Work Consult for Recovery Care Planning/Counseling Complete   Palliative Care Screening Complete   Medication Review [employer]) Complete

## 2023-07-01 NOTE — Progress Notes (Addendum)
RN asked permission to the pt if we can change the dressings. Pt refuses and verbalized leave him alone. RN tried to educate the importance of change dressing to his pressure injury but the pt stated we will let us know what time we could change his dressing. Will continue to monitor.

## 2023-07-01 NOTE — Progress Notes (Signed)
   Subjective: Patient reports that his pain is slightly worse than yesterday, especially his muscle spasms. He says they are constant. He also notes abdominal muscle spasms, which are new. Says he otherwise feels fine but is mainly concerned about the spasms which have been causing him significant pain.  Objective:  Vital signs in last 24 hours: Vitals:   06/30/23 1441 06/30/23 2218 07/01/23 0331 07/01/23 0731  BP: (!) 147/52 (!) 155/65 (!) 159/80 138/64  Pulse: (!) 102 (!) 101 (!) 101 92  Resp: 17 17 20 16   Temp: 98.4 F (36.9 C) 98.7 F (37.1 C) 97.8 F (36.6 C) 98 F (36.7 C)  TempSrc: Oral Oral Oral Oral  SpO2: 96% 92% 93% 95%  Weight:      Height:       Physical Exam: Constitutional: Laying in bed, uncomfortable Cardiac: Regular rate and rhythm, no murmurs; pulses intact in right upper extremity MSK: Contractures of bilateral lower extremities, muscles tense diffusely.  Swelling appreciated in right upper extremity, improved from prior. Skin: PICC line noted in the left arm.  A few scabs noted on right upper extremity.  Right hip ulcer dressing is dry with no discharge or discoloration noted.  Labs: None  Assessment/Plan:  Principal Problem:   Infected decubitus ulcer Active Problems:   Diabetes mellitus (HCC)   Protein-calorie malnutrition, severe   Osteomyelitis of right femur [facility])  Patient Summary: [name] is a 67 yo male with a past medical history of hypertension, gastic/duodenal ulcers, malnutrition, and severe deconditioning, who was admitted for management of osteomyelitis of the right greater trochanter complicated by severe deconditioning.  Plan:  Osteomyelitis of the right greater trochanter 2/2 infected decubitus ulcer of the right lateral hip Patient's hip pain seems unchanged from yesterday.  The dressing is dry on examination and there is mild tenderness to palpation surrounding the bandage area.  No discoloration or discharge is noted.  He has  remained afebrile and hemodynamically stable. -Daptomycin and ceftriaxone through 07/31/2023 -Continue daily hydrotherapy and dressing changes -CK, CBC w/ diff, CMP, ESR/CRP weekly  Social determinants of health, disposition planning We are continuing to work with our social work and case work colleagues to come up with an appropriate discharge plan.  We are hoping to find a long-term care facility that will accept him, so that he can receive the care he needs given his wound healing and mobility challenges.  Muscle spasms Bilateral lower extremity weakness and contractures Patient is reporting worsening muscle spasms today diffusely, including in the abdomen. -Increase Robaxin to 750 mg 3 times daily -Continue home pain control regimen: Tylenol 1000 mg q8h, oxycodone 5 mg q3h PRN, gabapentin 800 mg TID  Diet: Regular IVF: None VTE: Enoxaparin Code: Full TOC recs: SNF   Dispo: Anticipated discharge to  SNF pending discharge planning  Annett Fabian, MD 07/01/2023, 3:12 PM Pager: [phone] After 5pm on weekdays and 1pm on weekends: On Call pager [phone]

## 2023-07-02 ENCOUNTER — Inpatient Hospital Stay (HOSPITAL_COMMUNITY): Payer: 59

## 2023-07-02 DIAGNOSIS — R0603 Acute respiratory distress: Secondary | ICD-10-CM

## 2023-07-02 MED ORDER — SODIUM CHLORIDE 0.9% FLUSH
10.0000 mL | INTRAVENOUS | Status: DC | PRN
  Administered 2023-07-04: 10 mL

## 2023-07-02 MED ORDER — SODIUM CHLORIDE 0.9% FLUSH
10.0000 mL | Freq: Two times a day (BID) | INTRAVENOUS | Status: DC
  Administered 2023-07-02 – 2023-07-10 (×15): 10 mL
  Administered 2023-07-11: 30 mL
  Administered 2023-07-11 – 2023-07-12 (×2): 10 mL
  Administered 2023-07-12: 20 mL
  Administered 2023-07-13 – 2023-07-23 (×18): 10 mL
  Administered 2023-07-24: 20 mL
  Administered 2023-07-24 – 2023-07-28 (×8): 10 mL

## 2023-07-02 MED ORDER — IPRATROPIUM BROMIDE 0.02 % IN SOLN
0.5000 mg | Freq: Four times a day (QID) | RESPIRATORY_TRACT | Status: DC
  Administered 2023-07-02 (×2): 0.5 mg via RESPIRATORY_TRACT
  Filled 2023-07-02 (×3): qty 2.5

## 2023-07-02 MED ORDER — IPRATROPIUM BROMIDE 0.02 % IN SOLN
0.5000 mg | Freq: Once | RESPIRATORY_TRACT | Status: AC
  Administered 2023-07-02: 0.5 mg via RESPIRATORY_TRACT
  Filled 2023-07-02: qty 2.5

## 2023-07-02 MED ORDER — IPRATROPIUM BROMIDE 0.02 % IN SOLN
0.5000 mg | Freq: Four times a day (QID) | RESPIRATORY_TRACT | Status: DC | PRN
  Administered 2023-07-04: 0.5 mg via RESPIRATORY_TRACT
  Filled 2023-07-02: qty 2.5

## 2023-07-02 MED ORDER — IOHEXOL 350 MG/ML SOLN
75.0000 mL | Freq: Once | INTRAVENOUS | Status: AC | PRN
  Administered 2023-07-02: 75 mL via INTRAVENOUS

## 2023-07-02 MED ORDER — METRONIDAZOLE 500 MG PO TABS
500.0000 mg | ORAL_TABLET | Freq: Three times a day (TID) | ORAL | Status: DC
  Administered 2023-07-02 – 2023-07-04 (×6): 500 mg via ORAL
  Filled 2023-07-02 (×7): qty 1

## 2023-07-02 NOTE — Progress Notes (Signed)
Occupational Therapy Treatment Patient Details Name: [name] MRN: [id number] DOB: [birth year] Today's Date: 07/02/2023   History of present illness 67 yo male with PMH of hypertension, gastric ulcers, severe deconditioning, left femoral fracture s/p IMN, previously admitted and treated for right hip osteomyelitis.  Discharged with home health on 7/8, returned on 7/9.   OT comments  Pt in bed upon therapy arrival. Nursing ok'd for pt to work with OT and reports that he was provided pain medication and ativan together earlier. Pt reports that he is having a rough day. He experienced difficulty breathing this AM, had a CT, and a breathing treatment. Session focused on bed level ADL task as lunch tray was on it's way up. Pt completed grooming task with primarily set-up of items/supplies required. Encouraged pt to sit more upright in bed to eat lunch although pt unable to tolerate reporting left hip pain when attempting. Lunch tray set-up for pt at end of session to allow pt to work on self-feeding. Pt continues to be appropriate for continued inpatient follow up therapy, <3 hours/day. OT will continue to follow patient acutely.    Recommendations for follow up therapy are one component of a multi-disciplinary discharge planning process, led by the attending physician.  Recommendations may be updated based on patient status, additional functional criteria and insurance authorization.    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 Pt unable to tolerate HOB to be elevated completely stating that his hip hurt when it was raised. Unable to tolerate HOB to be completely flat also for boosting. Patient Response: Flat affect, Cooperative  Transfers Overall transfer level:  (Not completed this date as lunch tray arrived.)        Balance Overall balance assessment: History of Falls              ADL either performed or assessed with clinical judgement   ADL Overall ADL's : Needs assistance/impaired Eating/Feeding: Set up;Bed level Eating/Feeding Details (indicate cue type and reason): provided total assist for proper set up and positioning while in bed. Set-up tray by opening containers/liquids and cutting cheeseburger in quarters Grooming: Wash/dry face;Oral care;Bed level;Minimal assistance Grooming Details (indicate cue type and reason): mouthwash used prior to lunch. OT poured mouthwash into cup while pt held it.  Provided wet washcloth to wash face. Pt provided with assist to clean lip fully.       Cognition Arousal/Alertness: Awake/alert Behavior During Therapy: WFL for tasks assessed/performed Overall Cognitive Status: Within Functional Limits for tasks assessed                    Pertinent Vitals/ Pain       Pain Assessment Pain Assessment: Faces Faces Pain Scale: Hurts whole lot Pain Location: Buttockes and BLE during bed mobility Pain Descriptors / Indicators: Grimacing, Discomfort  Pain Intervention(s): Repositioned, Premedicated before session, Monitored during session         Frequency  Min 2X/week        Progress Toward Goals  OT Goals(current goals can now be found in the care plan section)  Progress towards OT goals:  Progressing toward goals     Plan Discharge plan remains appropriate;Frequency remains appropriate       AM-PAC OT "6 Clicks" Daily Activity     Outcome Measure   Help from another person eating meals?: A Little Help from another person taking care of personal grooming?: A Little Help from another person toileting, which includes using toliet, bedpan, or urinal?: Total Help from another person bathing (including washing, rinsing, drying)?: Total Help from another person to put on and taking off regular upper body clothing?: A Lot Help from another person to put on and taking off regular lower body clothing?: Total 6 Click Score: 11    End of Session    OT Visit Diagnosis: Muscle weakness (generalized) (M62.81);History of falling (Z91.81);Pain Pain - Right/Left:  (bilateral) Pain - part of body: Leg (sacral region)   Activity Tolerance Patient limited by pain;No increased pain;Patient limited by fatigue   Patient Left in bed;with call bell/phone within reach   Nurse Communication Mobility status        Time: 9147-8295 OT Time Calculation (min): 21 min  Charges: OT General Charges $OT Visit: 1 Visit OT Treatments $Self Care/Home Management : 8-22 mins  Limmie Patricia, OTR/L,CBIS  Supplemental OT - [facility] and [facility] Secure Chat Preferred    [name], Charisse March 07/02/2023, 3:11 PM

## 2023-07-02 NOTE — Progress Notes (Signed)
Pt is in bed resting. He continues to be warm, dry, no visible distress. O2@2lpm  via [place] remains intact. No signs of pain or discomfort.

## 2023-07-02 NOTE — Progress Notes (Signed)
Pt stated that he was having trouble breathing. Noted that he is using abdominal muscles when breathing. Lungs- rhonchi throughout. He is coughing up phelgm. But, he is not coughing it up and out. Encouraged him in doing so. Sats-90% on 2lpm via [place]. Increased o2 to 3lpm via [place]. Sats increased to 92%. HOB elevated. Placed page to on call provider. Provider called back. Informed him of the above events as well as events from 0150 this morning. He stated that he would be up to see the pt this morning. Informed assigned AM nurse of these events, in addition.

## 2023-07-02 NOTE — Progress Notes (Addendum)
RT x2 attempted to obtain ABG. RT unsuccessful. Attending notified.

## 2023-07-02 NOTE — Progress Notes (Signed)
Pt appeared anxious and stating that he can't move his body. Pt was repositioned. Administered Ativan. He also reported being in pain and feeling as though he needed oxygen. No visible respiratory distress. Administered Oxycodone for pain. O2 sats on room air is 91%. Applied o2 @2lpm  via [place]. Sats rose to 95% and stable. No visible respiratory distress

## 2023-07-02 NOTE — Progress Notes (Signed)
Entered into pt's room to administer 0600 medication as well as complete the CHG bath. Pt stated angrily for me to "leave" him alone and that he didn't want it. He also refused AM vital signs with NT.

## 2023-07-02 NOTE — Progress Notes (Signed)
Paged by the nurse about increasing shortness of breath and discomfort. When I arrived to evaluate the patient, he was tachypneic and tachycardic, HR 122. He had accessory muscle use. He was on 3L, satting at 37. Patient said he feels "sick and weak" and says he woke up and was unable to move his legs last night. Per chart review, he missed a few doses of his Lovenox during his previous admission in early July. Given clinical symptoms and immobility in the setting of missed anticoagulation doses, we will order a CTA.

## 2023-07-02 NOTE — Progress Notes (Signed)
Pt refused AM vitals. Pt stated " I just got to sleep leave me alone and get out"  Pt also refused CHG bath this morning.  RN Marcelle Smiling notified.

## 2023-07-02 NOTE — Progress Notes (Addendum)
   Subjective: Patient tachypneic and tachycardic this morning heart rate up to 122.  States he started to feel bad overnight, which continued to this morning.  He also notes a wet cough, but has not coughed any phlegm up.  No lightheadedness, dizziness, or chest pain. He was on 3L [place], satting at 38.   Objective:  Vital signs in last 24 hours: Vitals:   07/01/23 1555 07/01/23 2036 07/02/23 0745 07/02/23 0824  BP: 116/64 136/66 (!) 155/81   Pulse: (!) 108 (!) 109 (!) 116 (!) 109  Resp: 16 15 17 20   Temp: 98.1 F (36.7 C) 98.4 F (36.9 C) 97.8 F (36.6 C)   TempSrc: Oral Oral Oral   SpO2: 95% 97% 96% 99%  Weight:      Height:       Physical Exam: Constitutional: Uncomfortable, ill-appearing Cardiac: Tachycardic, sinus rhythm Pulm: Tachypneic; mild rales noted in the lung bases. MSK: No new erythema, swelling, or tenderness noted  Labs: None  Assessment/Plan:  Principal Problem:   Infected decubitus ulcer Active Problems:   Diabetes mellitus (HCC)   Protein-calorie malnutrition, severe   Osteomyelitis of right femur [facility])  Patient Summary: [name] is a 67 yo male with a past medical history of hypertension, gastic/duodenal ulcers, malnutrition, and severe deconditioning, who was admitted for management of osteomyelitis of the right greater trochanter complicated by severe deconditioning.  Plan:  Acute respiratory distress Called to bedside to evaluate the patient this morning.  He was tachypneic and tachycardic, with a heart rate in the 120s.  He looked uncomfortable with notable accessory muscle use. Ordered ipratropium.  At the time of rounds a hour later, he was more comfortable with oxygen saturation of 99% on 3L [place]. An ABG was ordered, but they were unable to draw it.  CTA was ordered for concerns of possible PE.  It showed no evidence of PE, but demonstrated findings concerning for multilobar pneumonia vs malignancy, predominantly on the left.  He is currently on a  6-week course of daptomycin and ceftriaxone for osteomyelitis.  For now, we will continue to monitor his respiratory status on the breathing treatments, since he responded well to them. We will consider changing his antibiotics for better pulmonary coverage if his status changes. -Ipratropium 0.5 mg q6h -CBC, BMP in the morning  Osteomyelitis of the right greater trochanter 2/2 infected decubitus ulcer of the right lateral hip No acute changes.  Dressing remains dry with no apparent discharge.  Patient is hemodynamically stable and afebrile.  -CBC tomorrow -Daptomycin and ceftriaxone through 07/31/2023 -Daily hydrotherapy and dressing changes -CK, CBC w/ diff, CMP, ESR/CRP weekly  Social determinants of health, disposition planning Per TOC, patient has been approved for a SNF ([facility]) and brother has completed the admission paperwork.  Pending medical stability, they are able to admit the patient tomorrow.  Muscle spasms Bilateral lower extremity weakness and contractures Patient says his muscle spasms have improved with the increase in his medication yesterday.  PT evaluated him and recommended he continue to work on his mobility with PT upon discharge. -Continue Robaxin 750 mg 3 times daily -Continue home pain control regimen: Tylenol 1000 mg q8h, oxycodone 5 mg q3h PRN, gabapentin 800 mg TID  Diet: Regular IVF: None VTE: Enoxaparin Code: Full TOC recs: SNF   Dispo: Anticipated discharge to  SNF ([facility]) pending medical stability  Annett Fabian, MD 07/02/2023, 3:04 PM Pager: [phone] After 5pm on weekdays and 1pm on weekends: On Call pager [phone]

## 2023-07-02 NOTE — TOC Progression Note (Addendum)
Transition of Care [facility]) - Progression Note    Patient Details  Name: HANCE CASPERS MRN: [id number] Date of Birth: 1956-03-04  Transition [facility]) CM/SW Contact  Dellie Burns [place], [place] Phone Number: 07/02/2023, 9:43 AM  Clinical Narrative:  Berkley Harvey for [facility] remains pending at this time. Will provide updates as available.   UPDATE 1450: auth received for SNF ([id number], valid 7/11-7/15) and pt's brother has completed admission paperwork at [facility] Confirmed with Janie at [facility] they are prepared to admit pt tomorrow. MD updated.    Dellie Burns, MSW, LCSW [phone] (coverage)       Expected Discharge Plan: Skilled Nursing Facility Barriers to Discharge: Continued Medical Work up, English as a second language teacher  Expected Discharge Plan and Services                                               Social Determinants of Health (SDOH) Interventions SDOH Screenings   Food Insecurity: No Food Insecurity (06/30/2023)  Recent Concern: Food Insecurity - Food Insecurity Present (06/24/2023)  Housing: Low Risk  (06/30/2023)  Transportation Needs: No Transportation Needs (06/30/2023)  Utilities: Not At Risk (06/30/2023)  Tobacco Use: High Risk (06/29/2023)    Readmission Risk Interventions    06/22/2023    4:22 PM 05/04/2023    1:50 PM  Readmission Risk Prevention Plan  Transportation Screening Complete Complete  PCP or Specialist Appt within 5-7 Days  Complete  PCP or Specialist Appt within 3-5 Days Complete   Home Care Screening  Complete  Medication Review (RN CM)  Referral to Pharmacy  HRI or Home Care Consult Complete   Social Work Consult for Recovery Care Planning/Counseling Complete   Palliative Care Screening Complete   Medication Review Oceanographer) Complete

## 2023-07-03 LAB — BASIC METABOLIC PANEL
Anion gap: 4 — ABNORMAL LOW (ref 5–15)
BUN: 16 mg/dL (ref 8–23)
CO2: 26 mmol/L (ref 22–32)
Calcium: 7.9 mg/dL — ABNORMAL LOW (ref 8.9–10.3)
Chloride: 106 mmol/L (ref 98–111)
Creatinine, Ser: 0.65 mg/dL (ref 0.61–1.24)
GFR, Estimated: >60 mL/min (ref >60)
Glucose, Bld: 148 mg/dL — ABNORMAL HIGH (ref 70–99)
Potassium: 3.4 mmol/L — ABNORMAL LOW (ref 3.5–5.1)
Sodium: 136 mmol/L (ref 135–145)

## 2023-07-03 LAB — CBC
HCT: 27 % — ABNORMAL LOW (ref 39.0–52.0)
Hemoglobin: 8.3 g/dL — ABNORMAL LOW (ref 13.0–17.0)
MCH: 27.9 pg (ref 26.0–34.0)
MCHC: 30.7 g/dL (ref 30.0–36.0)
MCV: 90.6 fL (ref 80.0–100.0)
Platelets: 484 10*3/uL — ABNORMAL HIGH (ref 150–400)
RBC: 2.98 MIL/uL — ABNORMAL LOW (ref 4.22–5.81)
RDW: 15.2 % (ref 11.5–15.5)
WBC: 8.9 10*3/uL (ref 4.0–10.5)
nRBC: 0 % (ref 0.0–0.2)

## 2023-07-03 MED ORDER — POTASSIUM CHLORIDE CRYS ER 20 MEQ PO TBCR
40.0000 meq | EXTENDED_RELEASE_TABLET | Freq: Once | ORAL | Status: AC
  Administered 2023-07-03: 40 meq via ORAL
  Filled 2023-07-03: qty 2

## 2023-07-03 NOTE — Progress Notes (Signed)
Subjective: Patient was uncomfortable this morning, sweating at the time of exam.  He was still complaining of some difficulty breathing and a persistent wet cough.  He says he has not coughed up any phlegm.  He is on 3 L nasal cannula and satting well.  He also notes some numbness/tingling in his right hand and wrist, which has been going on for a few days but is new.  He says his muscle spasms have improved from yesterday.  Objective:  Vital signs in last 24 hours: Vitals:   07/02/23 2054 07/02/23 2100 07/03/23 0414 07/03/23 0735  BP:  (!) 145/67 (!) 117/58 118/67  Pulse:  (!) 108 (!) 101 (!) 103  Resp:  16 18 18   Temp:  98.3 F (36.8 C) 98.1 F (36.7 C) 97.7 F (36.5 C)  TempSrc:  Oral Oral Oral  SpO2: 97% 97% 100% 100%  Weight:      Height:        Physical Exam: Constitutional: sweating, uncomfortable, ill-appearing Cardio: Mild tachycardia, regular rhythm, no murmurs Pulm: exam limited by immobility; mild rales predominantly in lung bases, L > R MSK: stable bilateral contractures of LE; generalized atrophy Neuro: some numbness/tingling in right hand extending to wrist  Labs:  CBC: Hemoglobin 8.3, WBC 8.9  BMP: Potassium 3.4, repleted. No acute electrolyte abnormalities.  Assessment/Plan:  Principal Problem:   Infected decubitus ulcer Active Problems:   Diabetes mellitus (HCC)   Protein-calorie malnutrition, severe   Osteomyelitis of right femur [facility])   Patient Summary: [name] is a 67 year old male with a past medical history of severe deconditioning, malnutrition, and hypertension who was admitted for right hip infected decubitus ulcers and was subsequently found to have osteomyelitis of the right greater trochanter.  He is currently being treated for this condition and suspected aspiration pneumonia.  Aspiration pneumonia Given his bedbound status and generalized weakness, he is at high risk for aspiration pneumonia.  The new respiratory distress and CT  evidence of new pneumonia, predominantly in the left lung base, suggest aspiration pneumonia. He remains afebrile.  He is satting well on 3 L nasal cannula.  He is currently on a 6-week course of daptomycin and ceftriaxone; we added Flagyl for anaerobic coverage. -Speech eval -Ipratropium 0.5 mg every 6 hours -Continue Flagyl 500 mg twice daily  Osteomyelitis of the right greater trochanter 2/2 infected decubitus ulcer of the right lateral hip No acute changes noted since yesterday.  Patient does not report any new pain.  The dressing looks dry, we will continue to do daily hydrotherapy and dressing changes.  He remains afebrile with normal WBC. -Daptomycin and ceftriaxone through 8/9 -Daily hydrotherapy -Continue dressing changes and offloading/turning -Weekly CK, CBC with differential, CMP, ESR/CRP  Muscle spasms Sarcopenia, bilateral LE weakness and contractures Numbness/tingling of R hand His muscle spasms have improved on a higher robaxin dose.  He reports new numbness in his right hand extending to the wrist, worsening over the past few days.  We think this is likely due to nerve compression given his positioning in the bed and pressure on his elbow.  Spoke with him about keeping his arm resting on his lap and not putting too much pressure down on it. -Continue Robaxin 750 mg 3 times daily -Continue home pain control regimen: Tylenol 1000 mg q8h, oxycodone 5 mg q3h PRN, gabapentin 800 mg TID  Social determinants of health, disposition planning Per TOC, the patient has been approved for a SNF, and the approval lasts until Monday.  We are planning on discharging him this weekend as long as he remains medically stable.  Diet: Normal IVF: None VTE: Enoxaparin Code: Full TOC recs: Discharge to SNF ([facility]) this weekend; auth lasts until Monday Family Update: Called and updated brother, [name].    Dispo: Anticipated discharge to SNF pending medical stability and safe dispo  plan.   Annett Fabian, MD 07/03/2023, 1:07 PM Pager: [phone] After 5pm on weekdays and 1pm on weekends: On Call pager [phone]

## 2023-07-03 NOTE — Progress Notes (Signed)
Physical Therapy Wound Treatment Patient Details  Name: [name] MRN: [id number] Date of Birth: Mar 07, 1956  Today's Date: 07/03/2023 Time: 1006-1040 Time Calculation (min): 34 min  Subjective  Subjective Assessment Subjective: I can't lay all the way flat. Roll me like this Patient and Family Stated Goals: For wound to heal Date of Onset:  (Present on admission) Prior Treatments: bedside debridement and wound therapy prior admission  Pain Score:  Pain with positioning (especially in RLE); mild pain noted by pt with debridement of upper buttock wound  Wound Assessment               Pressure Injury 06/19/23 Buttocks Right;Upper Unstageable - Full thickness tissue loss in which the base of the injury is covered by slough (yellow, tan, gray, green or brown) and/or eschar (tan, brown or black) in the wound bed. (Active)  Wound Image  07/01/23 1504  Dressing Type Foam - Lift dressing to assess site every shift;Gauze (Comment);Normal saline moist dressing;Santyl 07/03/23 1047  Dressing Clean, Dry, Intact;Changed 07/03/23 1047  Dressing Change Frequency Twice a day 07/03/23 1047  State of Healing Non-healing 07/03/23 1047  Site / Wound Assessment Yellow;Pink 07/03/23 1047  % Wound base Red or Granulating 5% 07/03/23 1047  % Wound base Yellow/Fibrinous Exudate 95% 07/03/23 1047  % Wound base Black/Eschar 0% 07/03/23 1047  % Wound base Other/Granulation Tissue (Comment) 0% 07/01/23 1504  Peri-wound Assessment Intact 07/03/23 1047  Wound Length (cm) 4 cm 07/01/23 1400  Wound Width (cm) 2.1 cm 07/01/23 1400  Wound Depth (cm) 0.3 cm 07/01/23 1400  Wound Surface Area (cm^2) 8.4 cm^2 07/01/23 1400  Wound Volume (cm^3) 2.52 cm^3 07/01/23 1400  Tunneling (cm) 0 07/01/23 1504  Undermining (cm) 0 07/01/23 1504  Margins Unattached edges (unapproximated) 07/03/23 1047  Drainage Amount Scant 07/03/23 1047  Drainage Description Serous 07/03/23 1047  Treatment Cleansed;Debridement  (Selective);Irrigation 07/03/23 1047     Pressure Injury 06/19/23 Buttocks Right Unstageable - Full thickness tissue loss in which the base of the injury is covered by slough (yellow, tan, gray, green or brown) and/or eschar (tan, brown or black) in the wound bed. (Active)  Wound Image  07/01/23 1504  Dressing Type Foam - Lift dressing to assess site every shift;Gauze (Comment);Santyl;Barrier Film (skin prep);Other (Comment) 07/03/23 1047  Dressing Clean, Dry, Intact;Changed 07/03/23 1047  Dressing Change Frequency Twice a day 07/03/23 1047  State of Healing Early/partial granulation 07/03/23 1047  Site / Wound Assessment Yellow;Red;Black 07/03/23 1047  % Wound base Red or Granulating 50% 07/03/23 1047  % Wound base Yellow/Fibrinous Exudate 40% 07/03/23 1047  % Wound base Black/Eschar 10% 07/03/23 1047  % Wound base Other/Granulation Tissue (Comment) 0% 07/01/23 1504  Peri-wound Assessment Pink;Excoriated 07/03/23 1047  Wound Length (cm) 7.1 cm 07/01/23 1400  Wound Width (cm) 8.2 cm 07/01/23 1400  Wound Depth (cm) 1.2 cm 07/01/23 1400  Wound Surface Area (cm^2) 58.22 cm^2 07/01/23 1400  Wound Volume (cm^3) 69.86 cm^3 07/01/23 1400  Tunneling (cm) 0 07/01/23 1504  Undermining (cm) Up to 7.2 cm from 12:00-7:00. 7.2 cm max at 4:00 07/01/23 1400  Margins Unattached edges (unapproximated) 07/03/23 1047  Drainage Amount Copious 07/03/23 1047  Drainage Description Serous 07/03/23 1047  Treatment Debridement (Selective);Irrigation 07/03/23 1047      Selective Debridement (non-excisional) Selective Debridement (non-excisional) - Location: right buttocks proximal and distal wound Selective Debridement (non-excisional) - Tools Used: Forceps, Scissors, Scalpel Selective Debridement (non-excisional) - Tissue Removed: Proximal wound mostly adherent but minimal amount of tissue able to  be debrided. Distal wound with large amount of yellow and black necrotic tissue in undermining.    Wound  Assessment and Plan  Wound Therapy - Assess/Plan/Recommendations Wound Therapy - Clinical Statement: Pt presents to wound therapy with proximal and distal R buttock wounds that were previously being seen by wound therapy last admission. Debridement mostly able to be completed at distal wound in undermining. This patient will benefit from continued wound therapy for selective removal of unviable tissue, to decrease bioburden, and promote wound bed healing. Wound Therapy - Functional Problem List: immobility, paraplegia Factors Delaying/Impairing Wound Healing: Immobility Hydrotherapy Plan: Debridement, Dressing change, Patient/family education Wound Therapy - Frequency: 2X / week Wound Therapy - Follow Up Recommendations: dressing changes by RN  Wound Therapy Goals- Improve the function of patient's integumentary system by progressing the wound(s) through the phases of wound healing (inflammation - proliferation - remodeling) by: Wound Therapy Goals - Improve the function of patient's integumentary system by progressing the wound(s) through the phases of wound healing by: Decrease Necrotic Tissue to: 20% Decrease Necrotic Tissue - Progress: Progressing toward goal Increase Granulation Tissue to: 80% Increase Granulation Tissue - Progress: Progressing toward goal Goals/treatment plan/discharge plan were made with and agreed upon by patient/family: Yes Time For Goal Achievement: 7 days Wound Therapy - Potential for Goals: Fair  Goals will be updated until maximal potential achieved or discharge criteria met.  Discharge criteria: when goals achieved, discharge from hospital, MD decision/surgical intervention, no progress towards goals, refusal/missing three consecutive treatments without notification or medical reason.  GP     Charges PT Wound Care Charges $Wound Debridement up to 20 cm: < or equal to 20 cm $ Wound Debridement each add'l 20 sqcm: 2 $PT Hydrotherapy Visit: 1 Visit   [name], PT Acute Rehabilitation Services  Office [phone]       Zena Amos 07/03/2023, 10:54 AM

## 2023-07-03 NOTE — TOC Progression Note (Signed)
Transition of Care [facility]) - Progression Note    Patient Details  Name: [name] MRN: [id number] Date of Birth: 08/04/1956  Transition of Care [facility]) CM/SW Contact  Dellie Burns [place], [place] Phone Number: 07/03/2023, 9:36 AM  Clinical Narrative: per MD, pt not medically stable for dc today. Pt has auth for [facility] SNF, valid through 7/15. Janie at [facility] updated possible dc over weekend or on Monday.     Dellie Burns, MSW, LCSW [phone] (coverage)      Expected Discharge Plan: Skilled Nursing Facility Barriers to Discharge: Continued Medical Work up, English as a second language teacher  Expected Discharge Plan and Services                                               Social Determinants of Health (SDOH) Interventions SDOH Screenings   Food Insecurity: No Food Insecurity (06/30/2023)  Recent Concern: Food Insecurity - Food Insecurity Present (06/24/2023)  Housing: Low Risk  (06/30/2023)  Transportation Needs: No Transportation Needs (06/30/2023)  Utilities: Not At Risk (06/30/2023)  Tobacco Use: High Risk (06/29/2023)    Readmission Risk Interventions    06/22/2023    4:22 PM 05/04/2023    1:50 PM  Readmission Risk Prevention Plan  Transportation Screening Complete Complete  PCP or Specialist Appt within 5-7 Days  Complete  PCP or Specialist Appt within 3-5 Days Complete   Home Care Screening  Complete  Medication Review (RN CM)  Referral to Pharmacy  HRI or Home Care Consult Complete   Social Work Consult for Recovery Care Planning/Counseling Complete   Palliative Care Screening Complete   Medication Review Oceanographer) Complete

## 2023-07-03 NOTE — Progress Notes (Signed)
Physical Therapy Treatment Patient Details Name: SHRI ENFINGER MRN: [id number] DOB: [birth year] Today's Date: 07/03/2023   History of Present Illness 67 yo male with PMH of hypertension, gastric ulcers, severe deconditioning, left femoral fracture s/p IMN, previously admitted and treated for right hip osteomyelitis.  Discharged with home health on 7/8, returned on 7/9.    PT Comments  The pt was agreeable to limited session only due to pain after wound care earlier in the day. The pt was able to initiate movement in BLE at ankle and knee, but is limited in full ROM by pain and muscle tightness. Completed active assisted ROM at all joints as well as passive stretching of HS and calf bilaterally. Pt reports no change in pain as pain is constantly 10/10. Will continue to follow and progress mobility as tolerated. Recommendations remain appropriate.      Assistance Recommended at Discharge Frequent or constant Supervision/Assistance  If plan is discharge home, recommend the following:  Can travel by private vehicle    Assist for transportation;Help with stairs or ramp for entrance;Other (comment);Two people to help with walking and/or transfers;A lot of help with walking and/or transfers   No  Equipment Recommendations  Other (comment) (Pretty well-equipped overall; will need a specialty wc cushion to meet pressure redistribution needs due to decubitus ulcer)    Recommendations for Other Services       Precautions / Restrictions Precautions Precautions: Fall Precaution Comments: fell in 03/2023 with L femur fx (missed w/c during transfer), 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 agreeable to bed-level exercises only     Balance Overall balance assessment: History of  Falls (session completed at bed level. Unable to assess sitting balance d/t time constraint.)                                          Cognition Arousal/Alertness: Awake/alert Behavior During Therapy: WFL for tasks assessed/performed Overall Cognitive Status: Within Functional Limits for tasks assessed                                 General Comments: Able to provide background information and level of function prior to femur fracture and how it has changed.        Exercises General Exercises - Lower Extremity Ankle Circles/Pumps: AROM, Both, 20 reps Quad Sets: AAROM, Both, 10 reps Heel Slides: AAROM, Both, 10 reps    General Comments General comments (skin integrity, edema, etc.): SpO2 of 82% on pulse ox with pt on RA, applied 3L O2 and then acquired dynamap. by the time we got that reading, pulse ox SpO2 had improved to 88% and dynamap shows 90%. suspect pulse ox reading was off by a few points but still <90%      Pertinent Vitals/Pain Pain Assessment Pain Assessment: Faces Pain Score: 10-Worst pain ever Faces Pain Scale: Hurts even more Pain Location: Buttocks, BLE during any movement Pain Descriptors / Indicators: Constant, Grimacing, Discomfort, Spasm Pain Intervention(s): Limited activity within patient's tolerance, Monitored during session, Repositioned     PT Goals (current goals can  now be found in the care plan section) Acute Rehab PT Goals Patient Stated Goal: be able to get in/out of his wheelchair again PT Goal Formulation: With patient Time For Goal Achievement: 07/14/23 Potential to Achieve Goals: Poor Progress towards PT goals: Progressing toward goals    Frequency    Min 2X/week      PT Plan Current plan remains appropriate       AM-PAC PT "6 Clicks" Mobility   Outcome Measure  Help needed turning from your back to your side while in a flat bed without using bedrails?: Total Help needed moving from lying on your  back to sitting on the side of a flat bed without using bedrails?: Total Help needed moving to and from a bed to a chair (including a wheelchair)?: Total Help needed standing up from a chair using your arms (e.g., wheelchair or bedside chair)?: Total Help needed to walk in hospital room?: Total Help needed climbing 3-5 steps with a railing? : Total 6 Click Score: 6    End of Session Equipment Utilized During Treatment: Oxygen Activity Tolerance: Patient tolerated treatment well;Patient limited by pain Patient left: in bed;with call bell/phone within reach;with family/visitor present Nurse Communication: Need for lift equipment PT Visit Diagnosis: Muscle weakness (generalized) (M62.81);History of falling (Z91.81);Difficulty in walking, not elsewhere classified (R26.2)     Time: 1341-1405 PT Time Calculation (min) (ACUTE ONLY): 24 min  Charges:    $Therapeutic Exercise: 23-37 mins PT General Charges $$ ACUTE PT VISIT: 1 Visit                     Vickki Muff, PT, DPT   Acute Rehabilitation Department Office [phone] Secure Chat Communication Preferred   Ronnie Derby 07/03/2023, 3:54 PM

## 2023-07-03 NOTE — Care Management Important Message (Signed)
Important Message  Patient Details  Name: [name] MRN: [id number] Date of Birth: 1956-01-04   Medicare Important Message Given:  Yes     Sherilyn Banker 07/03/2023, 3:52 PM

## 2023-07-03 NOTE — Evaluation (Signed)
Clinical/Bedside Swallow Evaluation Patient Details  Name: [name] MRN: [id number] Date of Birth: 1955-12-26  Today's Date: 07/03/2023 Time: SLP Start Time (ACUTE ONLY): 1305 SLP Stop Time (ACUTE ONLY): 1312 SLP Time Calculation (min) (ACUTE ONLY): 7 min  Past Medical History:  Past Medical History:  Diagnosis Date   Cellulitis 07/17/2020   LEFT HIP   Diabetes mellitus    Dyspnea    Hyperlipidemia    Hypertension    Past Surgical History:  Past Surgical History:  Procedure Laterality Date   BIOPSY  05/01/2023   Procedure: BIOPSY;  Surgeon: Beverley Fiedler, MD;  Location: [facility] ENDOSCOPY;  Service: Gastroenterology;;   ESOPHAGOGASTRODUODENOSCOPY (EGD) WITH PROPOFOL N/A 05/01/2023   Procedure: ESOPHAGOGASTRODUODENOSCOPY (EGD) WITH PROPOFOL;  Surgeon: Beverley Fiedler, MD;  Location: [facility] ENDOSCOPY;  Service: Gastroenterology;  Laterality: N/A;   FEMUR IM NAIL Left 03/27/2023   Procedure: INTRAMEDULLARY (IM) RETROGRADE FEMORAL NAILING;  Surgeon: Myrene Galas, MD;  Location: [facility] OR;  Service: Orthopedics;  Laterality: Left;   SPINE SURGERY     HPI:  [name] is a 67 yo male who was admitted for management of osteomyelitis of the right greater trochanter complicated by severe deconditioning.  Chest CT 7/11: "Dense consolidation of the left lower lobe with associated extensive alveolar opacities throughout the remaining left lower lobe and posterior aspect of left upper lobe, compatible with multilobar pneumonia. Associated small-to-moderate left pleural effusion. No discrete obstructing mass seen within the limitations of this exam. Follow-up examination in 4-6 weeks is recommended to exclude underlying neoplastic process.  3. Multiple filling defects in the right lower lobe distal segmental and subsegmental bronchial tree, suggesting mucous/secretions versus aspiration."  Pt with a past medical history of hypertension, gastic/duodenal ulcers, malnutrition, and severe deconditioning.     Assessment / Plan / Recommendation  Clinical Impression  Pt presents with functional swallowing as assessed clinically.  Pt tolerated all consistencies trialed with no clinical s/s of aspiration.  Pt exhibited adequate oral clearance of solids with only mild R>L residue.  Pt would not tolerate HOB higher than 30 degrees 2/2 leg pain.  He was noted to cough intermittently in absence of POs.  There was no perceptual change in vocal quality or congested cough throughout evaluation.  Chest CT concerning for aspiration and per team, pt may benefit from further evaluation of swallow function to rule out silent aspiration, but unfortunately, given pt's positioning he will not be able to complete MBSS.  Will plan for FEES as SLP schedule permits.    Recommend continuing regular texture diet with thin liquid at this time.   SLP Visit Diagnosis: Dysphagia, unspecified (R13.10)    Aspiration Risk  Moderate aspiration risk    Diet Recommendation Regular;Thin liquid    Liquid Administration via: Cup;Straw Medication Administration:  (As tolerated, no specific precautions) Supervision: Staff to assist with self feeding Compensations: Slow rate;Small sips/bites Postural Changes: Seated upright at 90 degrees (as high as tolerated)    Other  Recommendations Oral Care Recommendations: Oral care BID    Recommendations for follow up therapy are one component of a multi-disciplinary discharge planning process, led by the attending physician.  Recommendations may be updated based on patient status, additional functional criteria and insurance authorization.  Follow up Recommendations  (TBD)      Assistance Recommended at Discharge    Functional Status Assessment  (TBD)  Frequency and Duration  (TBD)          Prognosis Prognosis for improved oropharyngeal function:  (  TBD)      Swallow Study   General HPI: [name] is a 67 yo male who was admitted for management of osteomyelitis of the right  greater trochanter complicated by severe deconditioning.  Chest CT 7/11: "Dense consolidation of the left lower lobe with associated extensive alveolar opacities throughout the remaining left lower lobe and posterior aspect of left upper lobe, compatible with multilobar pneumonia. Associated small-to-moderate left pleural effusion. No discrete obstructing mass seen within the limitations of this exam. Follow-up examination in 4-6 weeks is recommended to exclude underlying neoplastic process.  3. Multiple filling defects in the right lower lobe distal segmental and subsegmental bronchial tree, suggesting mucous/secretions versus aspiration."  Pt with a past medical history of hypertension, gastic/duodenal ulcers, malnutrition, and severe deconditioning. Type of Study: Bedside Swallow Evaluation Previous Swallow Assessment: BSE in Sept of 2021 Diet Prior to this Study: Regular;Thin liquids (Level 0) History of Recent Intubation: No Behavior/Cognition: Alert;Cooperative;Pleasant mood Oral Cavity Assessment: Within Functional Limits Oral Care Completed by SLP: No Oral Cavity - Dentition: Adequate natural dentition Patient Positioning: Partially reclined (Cannot tolerate HOB higher than 30 degrees) Baseline Vocal Quality: Normal Volitional Cough:  (Fair) Volitional Swallow: Able to elicit    Oral/Motor/Sensory Function Overall Oral Motor/Sensory Function: Within functional limits Facial ROM: Within Functional Limits Facial Symmetry: Within Functional Limits Lingual ROM: Within Functional Limits Lingual Symmetry: Within Functional Limits Lingual Strength: Within Functional Limits Velum: Within Functional Limits Mandible: Within Functional Limits   Ice Chips Ice chips: Not tested   Thin Liquid Thin Liquid: Within functional limits Presentation: Straw    Nectar Thick Nectar Thick Liquid: Not tested   Honey Thick Honey Thick Liquid: Not tested   Puree Puree: Within functional limits Presentation:  Spoon   Solid     Solid: Within functional limits Presentation:  (SLP fed)      Kerrie Pleasure, MA, CCC-SLP Acute Rehabilitation Services Office: [phone] 07/03/2023,1:39 PM

## 2023-07-04 DIAGNOSIS — Z66 Do not resuscitate: Secondary | ICD-10-CM

## 2023-07-04 DIAGNOSIS — M869 Osteomyelitis, unspecified: Secondary | ICD-10-CM

## 2023-07-04 DIAGNOSIS — E43 Unspecified severe protein-calorie malnutrition: Secondary | ICD-10-CM

## 2023-07-04 DIAGNOSIS — Z515 Encounter for palliative care: Secondary | ICD-10-CM

## 2023-07-04 DIAGNOSIS — R627 Adult failure to thrive: Secondary | ICD-10-CM

## 2023-07-04 MED ORDER — MORPHINE SULFATE (CONCENTRATE) 10 MG/0.5ML PO SOLN
5.0000 mg | ORAL | Status: DC | PRN
  Administered 2023-07-05: 5 mg via ORAL
  Filled 2023-07-04: qty 0.5

## 2023-07-04 MED ORDER — OXYCODONE HCL 5 MG PO TABS
10.0000 mg | ORAL_TABLET | ORAL | Status: DC | PRN
  Administered 2023-07-04 – 2023-07-11 (×15): 10 mg via ORAL
  Filled 2023-07-04 (×18): qty 2

## 2023-07-04 MED ORDER — IPRATROPIUM-ALBUTEROL 0.5-2.5 (3) MG/3ML IN SOLN: 3.0000 mL | Freq: Four times a day (QID) | RESPIRATORY_TRACT | Status: DC | PRN

## 2023-07-04 MED ORDER — LORAZEPAM 1 MG PO TABS: 1.0000 mg | ORAL_TABLET | Freq: Three times a day (TID) | ORAL | Status: DC | PRN

## 2023-07-04 MED ORDER — IPRATROPIUM BROMIDE 0.02 % IN SOLN
0.5000 mg | RESPIRATORY_TRACT | Status: DC
  Administered 2023-07-04: 0.5 mg via RESPIRATORY_TRACT
  Filled 2023-07-04: qty 2.5

## 2023-07-04 NOTE — Progress Notes (Signed)
   07/04/23 0510  Hygiene  Hygiene Patient refused;Other (Comment) (PT refused peri care. Pt refused getting checked to make sure his bed was dry. Pt stated "get out and leave me alone" to RN and NT.)

## 2023-07-04 NOTE — Progress Notes (Addendum)
HD#4 SUBJECTIVE:  Patient Summary: [name] is a 67 y.o. with a pertinent PMH of severe deconditioning and malnutrition, and admitted for osteomyelitis of R greater trochanter and aspiration pneumonia.   Overnight Events: None  Interim History: [name] was evaluated at the bedside. He states that he cannot breathe, although he is saturating well on 2 L [place]. He denies any pain, but is quite worried about his breathing.   OBJECTIVE:  Vital Signs: Vitals:   07/03/23 1612 07/03/23 2208 07/04/23 0510 07/04/23 0858  BP: (!) 184/87 (!) 165/91 134/89 (!) 161/82  Pulse: (!) 108 (!) 109 98 (!) 113  Resp: 18 18 19 18   Temp: 98.2 F (36.8 C) 98.3 F (36.8 C)  (!) 97.5 F (36.4 C)  TempSrc: Oral Oral    SpO2: 99% 100% 100% 98%  Weight:      Height:       Supplemental O2: Nasal Cannula SpO2: 98 % O2 Flow Rate (L/min): 2 L/min  Filed Weights   06/29/23 2155 06/30/23 0502  Weight: 83.5 kg 82.6 kg     Intake/Output Summary (Last 24 hours) at 07/04/2023 5784 Last data filed at 07/04/2023 0900 Gross per 24 hour  Intake 100 ml  Output 600 ml  Net -500 ml   Net IO Since Admission: -1,331 mL [07/04/23 0918]  Physical Exam: General: chronically ill appearing male laying in bed. Pulmonary: Normal effort on 2L [place], no tachypnea. Some rales appreciated anteriorly and upper airway noise. Extremities: Bilateral LE contractures and muscular atrophy. Psych: Normal mood and affect   Patient Lines/Drains/Airways Status     Active Line/Drains/Airways     Name Placement date Placement time Site Days   PICC Single Lumen 06/24/23 Left Basilic 44 cm 0 cm 06/24/23  6962  Basilic  10   External Urinary Catheter 06/30/23  0430  --  4   Pressure Injury 04/30/23 Hip Anterior;Left;Proximal Stage 2 -  Partial thickness loss of dermis presenting as a shallow open injury with a red, pink wound bed without slough. close to R hip 04/30/23  2200  -- 65   Pressure Injury 04/30/23 Hip  Anterior;Proximal;Right Stage 2 -  Partial thickness loss of dermis presenting as a shallow open injury with a red, pink wound bed without slough. 1/2 cm by 0.8cm by 0.2 cm 04/30/23  2200  -- 65   Pressure Injury 04/30/23 Heel Right;Left Stage 1 -  Intact skin with non-blanchable redness of a localized area usually over a bony prominence. soft pressure on heels bilaterally foams applied 04/30/23  2200  -- 65   Pressure Injury 06/19/23 Buttocks Right;Upper Unstageable - Full thickness tissue loss in which the base of the injury is covered by slough (yellow, tan, gray, green or brown) and/or eschar (tan, brown or black) in the wound bed. 06/19/23  1408  -- 15   Pressure Injury 06/19/23 Buttocks Right Unstageable - Full thickness tissue loss in which the base of the injury is covered by slough (yellow, tan, gray, green or brown) and/or eschar (tan, brown or black) in the wound bed. 06/19/23  1408  -- 15   Pressure Injury 06/19/23 Buttocks Right Stage 2 -  Partial thickness loss of dermis presenting as a shallow open injury with a red, pink wound bed without slough. 06/19/23  1408  -- 15   Pressure Injury 06/19/23 Hip Left;Lateral Stage 2 -  Partial thickness loss of dermis presenting as a shallow open injury with a red, pink wound bed  without slough. 06/19/23  1408  -- 15   Pressure Injury 06/19/23 Lumbar Lateral;Right Stage 2 -  Partial thickness loss of dermis presenting as a shallow open injury with a red, pink wound bed without slough. 06/19/23  1408  -- 15   Pressure Injury 06/19/23 Heel Left;Lateral Stage 4 - Full thickness tissue loss with exposed bone, tendon or muscle. 06/19/23  1408  -- 15   Pressure Injury 06/19/23 Foot Right;Lateral Unstageable - Full thickness tissue loss in which the base of the injury is covered by slough (yellow, tan, gray, green or brown) and/or eschar (tan, brown or black) in the wound bed. 06/19/23  1408  -- 15   Pressure Injury 06/19/23 Heel Right;Lateral Unstageable - Full  thickness tissue loss in which the base of the injury is covered by slough (yellow, tan, gray, green or brown) and/or eschar (tan, brown or black) in the wound bed. 06/19/23  1408  -- 15   Pressure Injury 06/19/23 Heel Left Unstageable - Full thickness tissue loss in which the base of the injury is covered by slough (yellow, tan, gray, green or brown) and/or eschar (tan, brown or black) in the wound bed. 06/19/23  1408  -- 15   Wound / Incision (Open or Dehisced) 08/02/20 Sacrum Medial 08/02/20  1458  Sacrum  1066   Wound / Incision (Open or Dehisced) 08/03/20 Hip Left 08/03/20  1513  Hip  1065             ASSESSMENT/PLAN:  Assessment: Principal Problem:   Infected decubitus ulcer Active Problems:   Diabetes mellitus (HCC)   Protein-calorie malnutrition, severe   Osteomyelitis of right femur (HCC)   Plan: Aspiration pneumonia The patient is saturating well on 2 L [place], but subjectively feels dyspneic. We discussed that since he has been bedbound and is so weak, he is at risk to aspirate again. His respiratory status is stable, but he continues to speak in short sentences. His airway clearance continues to be an issue and we suspect he will not be able to clear the consolidation in his LLL. We discussed the severity of his pneumonia and hip infection, but I do not think the patient has a good understanding of the reality of his situation.  - Continue to treat PNA with ceftriaxone + flagyl for 5 day course - Scheduled ipratropium q4h while awake  Osteomyelitis of R greater trochanter No changes compared to day prior and he is not having any pain.  - Continue daily hydrotherapy - Daptomycin + ceftriaxone through 8/9 - Weekly CK, CBC w diff, CMP, ESR, CRp  Muscle spasms Sarcopenia, bilateral LE weakness and contractures Numbness/tingling of R hand His muscle spasms are improved on higher dose of  -Continue Robaxin 750 mg 3 times daily -Continue home pain control regimen: Tylenol 1000  mg q8h, oxycodone 5 mg q3h PRN, gabapentin 800 mg TID  Severe deconditioning and weakness As aforementioned, the patient is severely deconditioned and is bedbound, which is why he had a decubitus ulcer that progressed to osteomyelitis. Despite a long course of antibiotics, the patient is at risk for the infection to not heal well. Additionally, his cough and voice are so weak, and he is not able to clear his airway well on his own. He has a LLL aspiration pneumonia that we are treating with antibiotics, but we suspect he will not be able to clear the dense consolidation out of his left lower lobe. He is certainly at risk to continue aspirating. I  engaged the patient in conversation today to discuss his overall prognosis and the complexity of his medical conditions, but he does not understand the reality of this situation despite our best efforts. I have consulted palliative care today to explore more goals of care, as outpatient palliative care services would certainly be beneficial for this patient. I will also call his brother, so he understands the gravity of this situation, too.   Best Practice: Diet: Regular diet  IVF: Fluids: none VTE: enoxaparin (LOVENOX) injection 40 mg Start: 06/30/23 0800 Code: Full AB: CTX, dapto, flagyl Therapy Recs: SNF Family Contact: Brother, to be notified. DISPO: Anticipated discharge in 1-3 days to Skilled nursing facility pending  Stability of respiratory status .  Signature: Elza Rafter, D.O.  Internal Medicine Resident, PGY-3 [facility]  Pager: [phone] 9:18 AM, 07/04/2023   Please contact the on call pager after 5 pm and on weekends at [phone].

## 2023-07-04 NOTE — Plan of Care (Signed)
  Problem: Coping: Goal: Level of anxiety will decrease Outcome: Progressing   Problem: Pain Managment: Goal: General experience of comfort will improve Outcome: Progressing   Problem: Safety: Goal: Ability to remain free from injury will improve Outcome: Progressing   Problem: Skin Integrity: Goal: Risk for impaired skin integrity will decrease Outcome: Progressing   

## 2023-07-04 NOTE — Consult Note (Signed)
Consultation Note Date: 07/04/2023   Patient Name: [name]  DOB: 1956-10-25  MRN: [id number]  Age / Sex: 67 y.o., male  PCP: Pcp, No Referring Physician: Dickie La, MD  Reason for Consultation: Establishing goals of care  HPI/Patient Profile: 67 y.o. male  admitted on 06/29/2023 with   PMH of severe deconditioning and malnutrition, and admitted for osteomyelitis of R greater trochanter and aspiration pneumonia.    5 recent  hospitalization.   Overall failure to thrive, albumin 1.7  Patient and his family  faces treatmetn option decsions, advanced directive decsions.   Clinical Assessment and Goals of Care:  This NP Lorinda Creed reviewed medical records, received report from team, assessed the patient and then meet at the patient's bedside  to discuss diagnosis, prognosis, GOC, EOL wishes disposition and options.  I then met again later in the day at the bedside with patient's brother   Concept of Palliative Care was introduced as specialized medical care for people and their families living with serious illness.  If focuses on providing relief from the symptoms and stress of a serious illness.  The goal is to improve quality of life for both the patient and the family.   Values and goals of care important to patient and family were attempted to be elicited.  Created space and opportunity for patient  and family to explore thoughts and feelings regarding current medical situation.  Patient is able to verbalize his understanding of his complicated medical situation and associated poor prognosis.  He speaks to his "pretty much constant pain". He speaks to his frustration and loss of independence and overall vulnerability being dependent for all activities of daily living.    A  discussion was had today regarding advanced directives.  Concepts specific to code status, artifical feeding and hydration,  continued IV antibiotics and rehospitalization was had.    The difference between a aggressive medical intervention path  and a palliative comfort care path for this patient at this time was had.    Patient clearly articulates his desire for a full comfort path allowing for natural death.  His brother supports his decision   MOST form pleated to reflect full comfort   Natural trajectory and expectations at EOL were discussed.  Questions and concerns addressed.  Patient  encouraged to call with questions or concerns.     PMT will continue to support holistically.             NEXT OF KIN/Bobby Alverson     SUMMARY OF RECOMMENDATIONS    Focus of care is comfort, quality and dignity allowing for natural death  Code Status/Advance Care Planning: DNR-documented today No further life-prolonging measures, no diagnostics, no IV antibiotic use Diet as tolerated    Symptom Management:  Pain generalized-oxycodone 10 mg p.o. every 3 hours as needed for severe pain Anxiety-Ativan 1 mg p.o. every 8 hours as needed Dyspnea-Roxanol 5 mg p.o./sublingual every 4 hours as needed  Palliative Prophylaxis:  Aspiration, Bowel Regimen, Delirium Protocol, and Frequent Pain  Assessment  Additional Recommendations (Limitations, Scope, Preferences): Full Comfort Care  Psycho-social/Spiritual:  Desire for further Chaplaincy support:no Additional Recommendations: Education on Hospice  Prognosis:  Evaluate in 24 to 48 hours for more accurate prognostication after shift to comfort  Discharge Planning: To Be Determined      Primary Diagnoses: Present on Admission:  Infected decubitus ulcer  Protein-calorie malnutrition, severe  Osteomyelitis of right femur (HCC)   I have reviewed the medical record, interviewed the patient and family, and examined the patient. The following aspects are pertinent.  Past Medical History:  Diagnosis Date   Cellulitis 07/17/2020   LEFT HIP   Diabetes  mellitus    Dyspnea    Hyperlipidemia    Hypertension    Social History   Socioeconomic History   Marital status: Single    Spouse name: Not on file   Number of children: Not on file   Years of education: Not on file   Highest education level: Not on file  Occupational History   Not on file  Tobacco Use   Smoking status: Every Day    Current packs/day: 0.50    Average packs/day: 0.5 packs/day for 40.0 years (20.0 ttl pk-yrs)    Types: Cigarettes   Smokeless tobacco: Never  Vaping Use   Vaping status: Never Used  Substance and Sexual Activity   Alcohol use: No   Drug use: No   Sexual activity: Not Currently  Other Topics Concern   Not on file  Social History Narrative   Not on file   Social Determinants of Health   Financial Resource Strain: Not on file  Food Insecurity: No Food Insecurity (06/30/2023)   Hunger Vital Sign    Worried About Running Out of Food in the Last Year: Never true    Ran Out of Food in the Last Year: Never true  Recent Concern: Food Insecurity - Food Insecurity Present (06/24/2023)   Hunger Vital Sign    Worried About Running Out of Food in the Last Year: Sometimes true    Ran Out of Food in the Last Year: Sometimes true  Transportation Needs: No Transportation Needs (06/30/2023)   PRAPARE - Administrator, Civil Service (Medical): No    Lack of Transportation (Non-Medical): No  Physical Activity: Not on file  Stress: Not on file  Social Connections: Not on file   History reviewed. No pertinent family history. Scheduled Meds:  acetaminophen  1,000 mg Oral Q8H   Chlorhexidine Gluconate Cloth  6 each Topical Q0600   collagenase   Topical Daily   enoxaparin (LOVENOX) injection  40 mg Subcutaneous Daily   gabapentin  800 mg Oral TID   ipratropium  0.5 mg Nebulization Q4H while awake   losartan  25 mg Oral Daily   methocarbamol  750 mg Oral TID   metroNIDAZOLE  500 mg Oral Q8H   mirtazapine  15 mg Oral QHS   multivitamin with  minerals  1 tablet Oral Daily   nutrition supplement (JUVEN)  1 packet Oral BID BM   pantoprazole  40 mg Oral BID   senna-docusate  1 tablet Oral QHS   sodium chloride flush  10-40 mL Intracatheter Q12H   Continuous Infusions:  cefTRIAXone (ROCEPHIN)  IV 2 g (07/04/23 0850)   DAPTOmycin (CUBICIN) 650 mg in sodium chloride 0.9 % IVPB 650 mg (07/03/23 1459)   PRN Meds:.LORazepam, oxyCODONE, sodium chloride flush Medications Prior to Admission:  Prior to Admission medications   Medication Sig Start Date  End Date Taking? Authorizing Provider  cefTRIAXone (ROCEPHIN) IVPB Inject 2 g into the vein daily. Indication:  Decubitus ulcer/osteomyelitis  First Dose: Yes Last Day of Therapy:  07/31/23 Labs - Once weekly:  CBC/D and BMP, Labs - Once weekly: ESR and CRP Method of administration: IV Push Method of administration may be changed at the discretion of home infusion pharmacist based upon assessment of the patient and/or caregiver's ability to self-administer the medication ordered. 06/29/23 08/05/23  Annett Fabian, MD  daptomycin (CUBICIN) IVPB Inject 650 mg into the vein daily. Indication:  Decubitus ulcer/osteomyelitis  First Dose: Yes Last Day of Therapy:  07/31/23 Labs - Once weekly:  CBC/D, BMP, and CPK Labs - Once weekly: ESR and CRP Method of administration: IV Push Method of administration may be changed at the discretion of home infusion pharmacist based upon assessment of the patient and/or caregiver's ability to self-administer the medication ordered. 06/29/23 08/05/23  Annett Fabian, MD  feeding supplement (ENSURE ENLIVE / ENSURE PLUS) LIQD Take 237 mLs by mouth 3 (three) times daily between meals. 06/29/23   Annett Fabian, MD  gabapentin (NEURONTIN) 800 MG tablet Take 800 mg by mouth 3 (three) times daily.    [provider]  LORazepam (ATIVAN) 0.5 MG tablet Take 1 tablet (0.5 mg total) by mouth every 8 (eight) hours as needed (nerve provlem). 05/10/23   Bethann Berkshire, MD   losartan (COZAAR) 25 MG tablet Take 25 mg by mouth daily.    [provider]  methocarbamol (ROBAXIN) 500 MG tablet Take 1 tablet (500 mg total) by mouth 3 (three) times daily for 7 days. 06/29/23 07/06/23  Atway, Rayann N, DO  mirtazapine (REMERON SOL-TAB) 15 MG disintegrating tablet Take 1 tablet (15 mg total) by mouth at bedtime. 04/01/23 03/31/24  Morene Crocker, MD  Multiple Vitamin (MULTIVITAMIN WITH MINERALS) TABS tablet Take 1 tablet by mouth daily. 06/29/23   Annett Fabian, MD  nutrition supplement, JUVEN, (JUVEN) PACK Take 1 packet by mouth 2 (two) times daily between meals. 06/29/23 07/29/23  Annett Fabian, MD  oxyCODONE (OXY IR/ROXICODONE) 5 MG immediate release tablet Take 5 mg by mouth 2 (two) times daily. 05/29/23   [provider]  pantoprazole (PROTONIX) 40 MG tablet Take 1 tablet (40 mg total) by mouth 2 (two) times daily before a meal. Patient not taking: Reported on 05/09/2023 05/04/23 06/03/23  Willette Cluster, MD  senna-docusate (SENOKOT-S) 8.6-50 MG tablet Take 1 tablet by mouth at bedtime. 05/08/23   Willette Cluster, MD   Allergies  Allergen Reactions   Lipitor [Atorvastatin Calcium] Other (See Comments)    Myalgias    Review of Systems  Constitutional:        "I am in pain all over"    Physical Exam Cardiovascular:     Rate and Rhythm: Tachycardia present.  Musculoskeletal:     Comments: Bilateral lower extremity contracture Generalized weakness and muscle atrophy  Skin:    General: Skin is warm and dry.  Neurological:     Mental Status: He is alert.     Vital Signs: BP (!) 161/82 (BP Location: Right Arm)   Pulse (!) 113 Comment: RN aware- recheck in an hour  Temp (!) 97.5 F (36.4 C)   Resp 18   Ht 5\' 10"  (1.778 m)   Wt 82.6 kg   SpO2 98%   BMI 26.13 kg/m  Pain Scale: 0-10 POSS *See Group Information*: S-Acceptable,Sleep, easy to arouse Pain Score: 10-Worst pain ever   SpO2: SpO2: 98 % O2  Device:SpO2: 98 % O2 Flow Rate: .O2 Flow  Rate (L/min): 2 L/min  IO: Intake/output summary:  Intake/Output Summary (Last 24 hours) at 07/04/2023 0958 Last data filed at 07/04/2023 0900 Gross per 24 hour  Intake 100 ml  Output 600 ml  Net -500 ml    LBM: Last BM Date : 07/03/23 Baseline Weight: Weight: 83.5 kg Most recent weight: Weight: 82.6 kg     Palliative Assessment/Data: 30 %      Time 90 minutes  Greater than 50%  of this time was spent counseling and coordinating care related to the above assessment and plan.  Signed by: Lorinda Creed, NP   Please contact Palliative Medicine Team phone at [phone] for questions and concerns.  For individual provider: See Loretha Stapler

## 2023-07-05 DIAGNOSIS — L089 Local infection of the skin and subcutaneous tissue, unspecified: Secondary | ICD-10-CM

## 2023-07-05 DIAGNOSIS — M869 Osteomyelitis, unspecified: Secondary | ICD-10-CM | POA: Diagnosis present

## 2023-07-05 DIAGNOSIS — L8990 Pressure ulcer of unspecified site, unspecified stage: Secondary | ICD-10-CM

## 2023-07-05 DIAGNOSIS — Z7189 Other specified counseling: Secondary | ICD-10-CM

## 2023-07-05 MED ORDER — ENOXAPARIN SODIUM 40 MG/0.4ML IJ SOSY
40.0000 mg | PREFILLED_SYRINGE | INTRAMUSCULAR | Status: DC
  Administered 2023-07-05 – 2023-07-27 (×22): 40 mg via SUBCUTANEOUS
  Filled 2023-07-05 (×22): qty 0.4

## 2023-07-05 MED ORDER — SODIUM CHLORIDE 0.9 % IV SOLN
2.0000 g | INTRAVENOUS | Status: DC
  Administered 2023-07-05 – 2023-07-10 (×6): 2 g via INTRAVENOUS
  Filled 2023-07-05 (×6): qty 20

## 2023-07-05 MED ORDER — METRONIDAZOLE 500 MG PO TABS
500.0000 mg | ORAL_TABLET | Freq: Three times a day (TID) | ORAL | Status: AC
  Administered 2023-07-05 – 2023-07-07 (×9): 500 mg via ORAL
  Filled 2023-07-05 (×9): qty 1

## 2023-07-05 MED ORDER — HYDROXYZINE HCL 10 MG PO TABS: 10.0000 mg | ORAL_TABLET | Freq: Four times a day (QID) | ORAL | Status: DC | PRN

## 2023-07-05 MED ORDER — ADULT MULTIVITAMIN W/MINERALS CH
1.0000 | ORAL_TABLET | Freq: Every day | ORAL | Status: DC
  Administered 2023-07-05 – 2023-07-21 (×16): 1 via ORAL
  Filled 2023-07-05 (×17): qty 1

## 2023-07-05 MED ORDER — METHOCARBAMOL 750 MG PO TABS: 750.0000 mg | ORAL_TABLET | Freq: Three times a day (TID) | ORAL | Status: DC

## 2023-07-05 MED ORDER — SODIUM CHLORIDE 0.9 % IV SOLN
8.0000 mg/kg | Freq: Every day | INTRAVENOUS | Status: DC
  Administered 2023-07-05 – 2023-07-10 (×6): 600 mg via INTRAVENOUS
  Filled 2023-07-05 (×7): qty 12

## 2023-07-05 MED ORDER — IPRATROPIUM BROMIDE 0.02 % IN SOLN
0.5000 mg | RESPIRATORY_TRACT | Status: DC | PRN
  Administered 2023-07-05 – 2023-07-10 (×3): 0.5 mg via RESPIRATORY_TRACT
  Filled 2023-07-05 (×3): qty 2.5

## 2023-07-05 MED ORDER — HYDROXYZINE HCL 50 MG/ML IM SOLN
25.0000 mg | Freq: Four times a day (QID) | INTRAMUSCULAR | Status: DC | PRN
  Administered 2023-07-05: 25 mg via INTRAMUSCULAR
  Filled 2023-07-05 (×2): qty 0.5

## 2023-07-05 MED ORDER — LOSARTAN POTASSIUM 25 MG PO TABS
25.0000 mg | ORAL_TABLET | Freq: Every day | ORAL | Status: DC
  Administered 2023-07-05 – 2023-07-21 (×16): 25 mg via ORAL
  Filled 2023-07-05 (×17): qty 1

## 2023-07-05 NOTE — Progress Notes (Addendum)
HD#5 SUBJECTIVE:  Patient Summary: [name] is a 67 y.o. with a pertinent PMH of severe deconditioning and malnutrition, and admitted for osteomyelitis of R greater trochanter and aspiration pneumonia.   Overnight Events: Yesterday afternoon the patient met with palliative care and decided he want to transition to a comfort care approach. His brother was in agreement with his decision, and the patient was fully transitioned to comfort care. Late last night/early this morning, the patient stated that he never had such a conversation and that he is upset we aren't treating him and letting him die.   Interim History: [name] was evaluated at the bedside. He is quite upset and does not recall the conversation he had yesterday with palliative. He is in a lot of pain and wants to go home.   I re-evaluated him with his brother, [name], present at the bedside. Mitchal still did not want to engage in much conversation with me, but states that he was not happy with how conversations went yesterday. He does not want to die and wants to keep treating his infections, with his goal of getting home.   OBJECTIVE:  Vital Signs: Vitals:   07/05/23 0520 07/05/23 0525 07/05/23 0530 07/05/23 0740  BP:    (!) 163/82  Pulse:    (!) 107  Resp:    17  Temp:    97.8 F (36.6 C)  TempSrc:    Oral  SpO2: (!) 80% 92% 90% 100%  Weight:      Height:       Supplemental O2: Nasal Cannula SpO2: 100 % O2 Flow Rate (L/min): 6 L/min  Filed Weights   06/29/23 2155 06/30/23 0502  Weight: 83.5 kg 82.6 kg     Intake/Output Summary (Last 24 hours) at 07/05/2023 1027 Last data filed at 07/04/2023 2159 Gross per 24 hour  Intake 200 ml  Output 600 ml  Net -400 ml   Net IO Since Admission: -1,731 mL [07/05/23 1027]  Physical Exam: General: chronically ill appearing male laying in bed. Pulmonary: Saturating well on 6 L [place], normal work of breathing.  Extremities: B/L LE contractures, generalized muscular  atrophy. Psych: Upset and angry mood   Patient Lines/Drains/Airways Status     Active Line/Drains/Airways     Name Placement date Placement time Site Days   PICC Single Lumen 06/24/23 Left Basilic 44 cm 0 cm 06/24/23  1610  Basilic  11   External Urinary Catheter 06/30/23  0430  --  5   Pressure Injury 04/30/23 Hip Anterior;Left;Proximal Stage 2 -  Partial thickness loss of dermis presenting as a shallow open injury with a red, pink wound bed without slough. close to R hip 04/30/23  2200  -- 66   Pressure Injury 04/30/23 Hip Anterior;Proximal;Right Stage 2 -  Partial thickness loss of dermis presenting as a shallow open injury with a red, pink wound bed without slough. 1/2 cm by 0.8cm by 0.2 cm 04/30/23  2200  -- 66   Pressure Injury 04/30/23 Heel Right;Left Stage 1 -  Intact skin with non-blanchable redness of a localized area usually over a bony prominence. soft pressure on heels bilaterally foams applied 04/30/23  2200  -- 66   Pressure Injury 06/19/23 Buttocks Right;Upper Unstageable - Full thickness tissue loss in which the base of the injury is covered by slough (yellow, tan, gray, green or brown) and/or eschar (tan, brown or black) in the wound bed. 06/19/23  1408  -- 16   Pressure  Injury 06/19/23 Buttocks Right Unstageable - Full thickness tissue loss in which the base of the injury is covered by slough (yellow, tan, gray, green or brown) and/or eschar (tan, brown or black) in the wound bed. 06/19/23  1408  -- 16   Pressure Injury 06/19/23 Buttocks Right Stage 2 -  Partial thickness loss of dermis presenting as a shallow open injury with a red, pink wound bed without slough. 06/19/23  1408  -- 16   Pressure Injury 06/19/23 Hip Left;Lateral Stage 2 -  Partial thickness loss of dermis presenting as a shallow open injury with a red, pink wound bed without slough. 06/19/23  1408  -- 16   Pressure Injury 06/19/23 Lumbar Lateral;Right Stage 2 -  Partial thickness loss of dermis presenting as a  shallow open injury with a red, pink wound bed without slough. 06/19/23  1408  -- 16   Pressure Injury 06/19/23 Heel Left;Lateral Stage 4 - Full thickness tissue loss with exposed bone, tendon or muscle. 06/19/23  1408  -- 16   Pressure Injury 06/19/23 Foot Right;Lateral Unstageable - Full thickness tissue loss in which the base of the injury is covered by slough (yellow, tan, gray, green or brown) and/or eschar (tan, brown or black) in the wound bed. 06/19/23  1408  -- 16   Pressure Injury 06/19/23 Heel Right;Lateral Unstageable - Full thickness tissue loss in which the base of the injury is covered by slough (yellow, tan, gray, green or brown) and/or eschar (tan, brown or black) in the wound bed. 06/19/23  1408  -- 16   Pressure Injury 06/19/23 Heel Left Unstageable - Full thickness tissue loss in which the base of the injury is covered by slough (yellow, tan, gray, green or brown) and/or eschar (tan, brown or black) in the wound bed. 06/19/23  1408  -- 16   Wound / Incision (Open or Dehisced) 08/02/20 Sacrum Medial 08/02/20  1458  Sacrum  1067   Wound / Incision (Open or Dehisced) 08/03/20 Hip Left 08/03/20  1513  Hip  1066             ASSESSMENT/PLAN:  Assessment: Principal Problem:   Infected decubitus ulcer Active Problems:   Diabetes mellitus (HCC)   Protein-calorie malnutrition, severe   Osteomyelitis of right femur (HCC)   Plan: Aspiration pneumonia The patient desaturated overnight and was placed on a non-rebreather, but was weaned down to 6 L [place].  He still subjectively feels dyspneic.  His airway clearance continues to be an issue and we suspect he will not be able to clear the consolidation of his LLL -Continue ceftriaxone and Flagyl (3 more days of Flagyl) -Scheduled ipratropium every 4 hours while awake  Osteomyelitis of right greater trochanter No changes in care/treatment of his osteomyelitis. -Daptomycin and ceftriaxone through 8/9 -Oxycodone 10 mg every 3 hours.   Severe. -Tylenol 1000 mg every 8 hours - Weekly CBC w diff, CMP, CK, ESR, CRP to be done tomorrow  Severe deconditioning and weakness The patient has a poor prognosis, as he is severely deconditioned and is bedbound which will lead to poor healing of his osteomyelitis and decubitus ulcer.  Despite a long course of antibiotics, he is at risk for this infection did not heal.  Additionally, he has an aspiration pneumonia, but we suspect he will not be able to clear the consolidation out of his left lower lobe due to his generalized debilitated state and weak cough.  The patient met with the palliative care team yesterday  and transition to a full comfort care approach. All medications were discontinued, and pain medications were ordered. The patient's brother, [name], supported his brother's decision to pursue a comfort care approach.   Late last night early this morning, the patient stated that he never agreed to transition to comfort care and wishes to continue aggressive treatment.  I do not think he understands the gravity of his situation, and he is now upset with the team, now wanting very limited interaction with [place].  I met with him again later this morning, when his brother was present, and he states that he still wants to think about his decision.  His goal is to get home, but we discussed that it would not be safe to do so at this time.  We also discussed going to [facility] for short-term rehab, but he perseverates on the fact that he does not have money to get a ride from [facility] back home.  States that he did not give anyone permission to stop his antibiotics, despite signing a MOST form indicating transition to comfort care.  The MOST form has now been destroyed, as the patient has changed his wishes.   Best Practice: Diet: Regular diet IVF: Fluids: none VTE: enoxaparin (LOVENOX) injection 40 mg Start: 07/05/23 2200 Code: DNR AB: Dapto, ctx, flagyl Therapy Recs: SNF Family Contact:  Bobby, at bedside. DISPO: Anticipated discharge tomorrow to Skilled nursing facility pending  placement and medical stability .  Signature: Elza Rafter, D.O.  Internal Medicine Resident, PGY-3 [facility]  Pager: [phone] 10:27 AM, 07/05/2023   Please contact the on call pager after 5 pm and on weekends at [phone].

## 2023-07-05 NOTE — Progress Notes (Signed)
Patient ID: ATZEL MCCAMBRIDGE, male   DOB: Sep 08, 1956, 67 y.o.   MRN: [id number]    Progress Note from the Palliative Medicine Team at [facility]   Patient Name: [name]        Date: 07/05/2023 DOB: March 23, 1964  Age: 67 y.o. MRN#: [id number] Attending Physician: Dickie La, MD Primary Care Physician: Pcp, No Admit Date: 06/29/2023    Extensive chart review has been completed prior to meeting with patient/family  including labs, vital signs, imaging, progress/consult notes, orders, medications and available advance directive documents.   67 y.o. male  admitted on 06/29/2023 with PMH of severe deconditioning and malnutrition, and admitted for osteomyelitis of R greater trochanter and aspiration pneumonia.     67 recent  hospitalization.   Overall failure to thrive, albumin 1.7  67 years patient made decision to forego life-prolonging measures, focus on comfort and dignity and allow for natural death  This NP assessed patient at the bedside as a follow up to  yesterday's GOCs meeting, and to respond to attending's concern that patient has verbalized desire to reverse comfort path and shift to a full medical support path.  Patient is agitated and verbalizing, "I had a bad dream and do not want to hear that I am dying".          Patient refused to engage with this nurse practitioner at this time.  I spoke to patient's brother by telephone and shared with him patient's verbalization that he is now open to all offered and available medical interventions to prolong life  Patient's brother will be at bedside and the attending team plans to talk with patient when his brother is present.   Education offered today regarding  the importance of continued conversation with family and their  medical providers regarding overall plan of care and treatment options,  ensuring decisions are within the context of the patients values and GOCs.  Questions and concerns addressed   Discussed with attending  team  Will follow-up in the morning with patient after he has had a chance to process his current medical situation and its associated poor prognosis and to discuss with his treatment team next steps  Patient and family are encouraged to call with questions or concerns.   Time:  66 minutes  Detailed review of medical records ( labs, imaging, vital signs), medically appropriate exam ( MS, skin, resp)   discussed with treatment team, counseling and education to patient, family, staff, documenting clinical information, medication management, coordination of care    Lorinda Creed NP  Palliative Medicine Team Team Phone # [phone] Pager [phone]

## 2023-07-05 NOTE — Progress Notes (Addendum)
Pt screaming out for help getting up. Upon assessment, pt removed O2, pulse ox, threw many things from bed on to floor. Was disoriented, anxious, pale and Spo2 was at 80%. Pt placed on NRB at 15 for appox 5 mins until O2 above 90, then titrated down to 6 L [place]. Pt is tachypneic and sweating. HR elevated to 135 now at 110. Newly DNR but seems unaware; stating "Why would yall send that lady in here earlier talking about I'm dying and I need to go to the place where people die" and "I was screaming because I thought I was dying". Provider notified. Will reattempt to give PRN. Continuous pulse ox still in place.

## 2023-07-05 NOTE — Progress Notes (Signed)
Paged by nursing for hypoxia and patient being upset and confused about palliative care talks and comfort care orders placed yesterday.  Please see nursing note from this morning.  Went to see the patient and nurse was in the room.  He appeared anxious but was breathing well with saturations of 95+ on 6 L nasal cannula.  Mildly tachycardic with heart rate around 110.  Stated that his shortness of breath got better after suctioning.  His main concern was his comfort care orders.  He remembers the conversation yesterday with palliative care and does not want to be on comfort care.  He would like to be treated for his pneumonia.  He does not want to die.  He was alert and oriented to person, place, but not time (2004).  He seemed understand his overall health is poor but he is still not ready for comfort care orders this morning.  Discussed the current treatment with comfort care orders versus restarting aggressive care.  He would like to restart aggressive care at this time.  Clarified CODE STATUS and he will remain a DNR.  Nursing was in the room and all questions were answered.  Day team was made aware and will adjust orders detailed in their daily progress note.  Rocky Morel, DO Internal Medicine Resident, PGY-2 Please contact the on call pager after 5 pm and on weekends at [phone].

## 2023-07-05 NOTE — Plan of Care (Signed)
  Problem: Education: Goal: Knowledge of General Education information will improve Description: Including pain rating scale, medication(s)/side effects and non-pharmacologic comfort measures 07/05/2023 2033 by Johnney Killian, RN Outcome: Progressing 07/05/2023 2016 by Johnney Killian, RN Outcome: Progressing   Problem: Health Behavior/Discharge Planning: Goal: Ability to manage health-related needs will improve 07/05/2023 2033 by Johnney Killian, RN Outcome: Progressing 07/05/2023 2016 by Johnney Killian, RN Outcome: Progressing   Problem: Clinical Measurements: Goal: Ability to maintain clinical measurements within normal limits will improve 07/05/2023 2033 by Johnney Killian, RN Outcome: Progressing 07/05/2023 2016 by Johnney Killian, RN Outcome: Progressing Goal: Will remain free from infection 07/05/2023 2033 by Johnney Killian, RN Outcome: Progressing 07/05/2023 2016 by Johnney Killian, RN Outcome: Progressing Goal: Diagnostic test results will improve 07/05/2023 2033 by Johnney Killian, RN Outcome: Progressing 07/05/2023 2016 by Johnney Killian, RN Outcome: Progressing Goal: Respiratory complications will improve 07/05/2023 2033 by Johnney Killian, RN Outcome: Progressing 07/05/2023 2016 by Johnney Killian, RN Outcome: Progressing Goal: Cardiovascular complication will be avoided 07/05/2023 2033 by Johnney Killian, RN Outcome: Progressing 07/05/2023 2016 by Johnney Killian, RN Outcome: Progressing   Problem: Activity: Goal: Risk for activity intolerance will decrease 07/05/2023 2033 by Johnney Killian, RN Outcome: Progressing 07/05/2023 2016 by Johnney Killian, RN Outcome: Progressing   Problem: Nutrition: Goal: Adequate nutrition will be maintained 07/05/2023 2033 by Johnney Killian, RN Outcome: Progressing 07/05/2023 2016 by Johnney Killian, RN Outcome: Progressing   Problem: Coping: Goal: Level of anxiety will decrease 07/05/2023 2033 by Johnney Killian, RN Outcome:  Progressing 07/05/2023 2016 by Johnney Killian, RN Outcome: Progressing   Problem: Elimination: Goal: Will not experience complications related to bowel motility 07/05/2023 2033 by Johnney Killian, RN Outcome: Progressing 07/05/2023 2016 by Johnney Killian, RN Outcome: Progressing Goal: Will not experience complications related to urinary retention 07/05/2023 2033 by Johnney Killian, RN Outcome: Progressing 07/05/2023 2016 by Johnney Killian, RN Outcome: Progressing   Problem: Pain Managment: Goal: General experience of comfort will improve 07/05/2023 2033 by Johnney Killian, RN Outcome: Progressing 07/05/2023 2016 by Johnney Killian, RN Outcome: Progressing   Problem: Safety: Goal: Ability to remain free from injury will improve 07/05/2023 2033 by Johnney Killian, RN Outcome: Progressing 07/05/2023 2016 by Johnney Killian, RN Outcome: Progressing   Problem: Skin Integrity: Goal: Risk for impaired skin integrity will decrease 07/05/2023 2033 by Johnney Killian, RN Outcome: Progressing 07/05/2023 2016 by Johnney Killian, RN Outcome: Progressing

## 2023-07-05 NOTE — Plan of Care (Signed)

## 2023-07-05 NOTE — Significant Event (Signed)
Rapid Response secondary assessment:  Reason for Call : C/o SOB and tachypnea Initial Focused Assessment:  I was notified for a second assessment for [name] c/o SOB and tachypnea in the setting of multilobar pneumonia and other complicated medical issues. [name] is alert, oriented and appropriate. He is very anxious and concerned about the conversation yesterday because he is in fear of dying. I helped him reposition into a semi-fowlers position. He stated he did get some relief since being placed on NRB mask. I adjusted his flow to 10L as this is the device minimum to avoid CO2 retention. His saturations were 96-98%. RR 24-26 with the ability to hold a conversation without stopping. BBS Rhonchi with rales in the LL base.  His tachypnea is largely related to anxiety and he is receiving 25 mg Atarax IM. NIPPV is not appropriate or needed at this time. If he deteriorates, GOC reevaluation would be appropriate since he still does not want to be placed on machines to support him.    Interventions:  -No interventions by RRRN  Plan of Care:  -supportive measures such as anxiolytics and oxygen as needed for relief.  -NRB mask should be a temporary measure and wean him back to [place] 6L HFNC as tolerated after treating anxiety.     MD Notified: per primary RN Call Time: 1919 Arrival Time: 2000 End Time: 2045  Rose Fillers, RN

## 2023-07-05 NOTE — Progress Notes (Signed)
Patient c/o dyspnea while on 4L [place]. Patient repositioned and then desated to 88%, O2 increased to on 6L Patient observed with shallow respirations, diminished lung sounds,  and elevated HR. NRB applied, RT and provider notified. Patient rebounded to 98% at the time RT arrived to administer neb tx. HFNC applied by RT and patient was between 93 and 94%. Patient continued to c/o dyspnea and HR remained elevated. Patient stated the mask felt more effective and it was reapplied. Provider notified. Handoff to night shift nurse to monitor changes and for further interventions.

## 2023-07-06 ENCOUNTER — Inpatient Hospital Stay (HOSPITAL_COMMUNITY): Payer: 59

## 2023-07-06 ENCOUNTER — Telehealth: Payer: 59 | Admitting: Student

## 2023-07-06 DIAGNOSIS — J90 Pleural effusion, not elsewhere classified: Secondary | ICD-10-CM

## 2023-07-06 DIAGNOSIS — J690 Pneumonitis due to inhalation of food and vomit: Secondary | ICD-10-CM | POA: Diagnosis not present

## 2023-07-06 LAB — GLUCOSE, CAPILLARY: Glucose-Capillary: 154 mg/dL — ABNORMAL HIGH (ref 70–99)

## 2023-07-06 LAB — CBC WITH DIFFERENTIAL/PLATELET
Abs Immature Granulocytes: 0.05 10*3/uL (ref 0.00–0.07)
Basophils Absolute: 0.1 10*3/uL (ref 0.0–0.1)
Basophils Relative: 1 %
Eosinophils Absolute: 0 10*3/uL (ref 0.0–0.5)
Eosinophils Relative: 0 %
HCT: 32.3 % — ABNORMAL LOW (ref 39.0–52.0)
Hemoglobin: 10 g/dL — ABNORMAL LOW (ref 13.0–17.0)
Immature Granulocytes: 1 %
Lymphocytes Relative: 12 %
Lymphs Abs: 1.3 10*3/uL (ref 0.7–4.0)
MCH: 27.6 pg (ref 26.0–34.0)
MCHC: 31 g/dL (ref 30.0–36.0)
MCV: 89.2 fL (ref 80.0–100.0)
Monocytes Absolute: 0.9 10*3/uL (ref 0.1–1.0)
Monocytes Relative: 8 %
Neutro Abs: 8.7 10*3/uL — ABNORMAL HIGH (ref 1.7–7.7)
Neutrophils Relative %: 78 %
Platelets: 620 10*3/uL — ABNORMAL HIGH (ref 150–400)
RBC: 3.62 MIL/uL — ABNORMAL LOW (ref 4.22–5.81)
RDW: 15 % (ref 11.5–15.5)
WBC: 11.1 10*3/uL — ABNORMAL HIGH (ref 4.0–10.5)
nRBC: 0 % (ref 0.0–0.2)

## 2023-07-06 LAB — COMPREHENSIVE METABOLIC PANEL
ALT: 16 U/L (ref 0–44)
AST: 23 U/L (ref 15–41)
Albumin: 1.9 g/dL — ABNORMAL LOW (ref 3.5–5.0)
Alkaline Phosphatase: 62 U/L (ref 38–126)
Anion gap: 12 (ref 5–15)
BUN: 12 mg/dL (ref 8–23)
CO2: 23 mmol/L (ref 22–32)
Calcium: 8.3 mg/dL — ABNORMAL LOW (ref 8.9–10.3)
Chloride: 101 mmol/L (ref 98–111)
Creatinine, Ser: 0.74 mg/dL (ref 0.61–1.24)
GFR, Estimated: >60 mL/min (ref >60)
Glucose, Bld: 152 mg/dL — ABNORMAL HIGH (ref 70–99)
Potassium: 3.5 mmol/L (ref 3.5–5.1)
Sodium: 136 mmol/L (ref 135–145)
Total Bilirubin: 0.7 mg/dL (ref 0.3–1.2)
Total Protein: 6.5 g/dL (ref 6.5–8.1)

## 2023-07-06 LAB — BODY FLUID CELL COUNT WITH DIFFERENTIAL
Eos, Fluid: 0 %
Lymphs, Fluid: 75 %
Monocyte-Macrophage-Serous Fluid: 12 % — ABNORMAL LOW (ref 50–90)
Neutrophil Count, Fluid: 13 % (ref 0–25)
Total Nucleated Cell Count, Fluid: 5250 cu mm — ABNORMAL HIGH (ref 0–1000)

## 2023-07-06 LAB — GRAM STAIN

## 2023-07-06 LAB — LACTATE DEHYDROGENASE, PLEURAL OR PERITONEAL FLUID: LD, Fluid: 160 U/L — ABNORMAL HIGH (ref 3–23)

## 2023-07-06 LAB — SEDIMENTATION RATE: Sed Rate: 75 mm/hr — ABNORMAL HIGH (ref 0–16)

## 2023-07-06 LAB — ALBUMIN, PLEURAL OR PERITONEAL FLUID: Albumin, Fluid: <1.5 g/dL

## 2023-07-06 LAB — TROPONIN I (HIGH SENSITIVITY)
Troponin I (High Sensitivity): 9 ng/L (ref <18)
Troponin I (High Sensitivity): 11 ng/L (ref <18)

## 2023-07-06 LAB — D-DIMER, QUANTITATIVE: D-Dimer, Quant: 2.42 ug/mL-FEU — ABNORMAL HIGH (ref 0.00–0.50)

## 2023-07-06 LAB — PROTEIN, PLEURAL OR PERITONEAL FLUID: Total protein, fluid: <3 g/dL

## 2023-07-06 LAB — CK: Total CK: 38 U/L — ABNORMAL LOW (ref 49–397)

## 2023-07-06 LAB — GLUCOSE, PLEURAL OR PERITONEAL FLUID: Glucose, Fluid: 134 mg/dL

## 2023-07-06 LAB — C-REACTIVE PROTEIN: CRP: 17.9 mg/dL — ABNORMAL HIGH (ref <1.0)

## 2023-07-06 MED ORDER — BUSPIRONE HCL 5 MG PO TABS
5.0000 mg | ORAL_TABLET | Freq: Three times a day (TID) | ORAL | Status: DC
  Administered 2023-07-06 – 2023-07-18 (×34): 5 mg via ORAL
  Filled 2023-07-06 (×36): qty 1

## 2023-07-06 MED ORDER — SODIUM CHLORIDE 0.9% FLUSH
10.0000 mL | Freq: Three times a day (TID) | INTRAVENOUS | Status: DC
  Administered 2023-07-06 – 2023-07-19 (×34): 10 mL via INTRAPLEURAL

## 2023-07-06 MED ORDER — IOHEXOL 350 MG/ML SOLN
75.0000 mL | Freq: Once | INTRAVENOUS | Status: AC | PRN
  Administered 2023-07-06: 75 mL via INTRAVENOUS

## 2023-07-06 MED ORDER — LORAZEPAM 0.5 MG PO TABS
0.5000 mg | ORAL_TABLET | Freq: Four times a day (QID) | ORAL | Status: DC | PRN
  Administered 2023-07-06: 0.5 mg via ORAL
  Filled 2023-07-06: qty 1

## 2023-07-06 MED ORDER — LIDOCAINE 5 % EX PTCH
1.0000 | MEDICATED_PATCH | CUTANEOUS | Status: DC
  Administered 2023-07-07 – 2023-07-24 (×6): 1 via TRANSDERMAL
  Filled 2023-07-06 (×16): qty 1

## 2023-07-06 MED ORDER — LIDOCAINE HCL (PF) 1 % IJ SOLN
5.0000 mL | Freq: Once | INTRAMUSCULAR | Status: DC
  Filled 2023-07-06: qty 5

## 2023-07-06 MED ORDER — LIDOCAINE HCL (PF) 1 % IJ SOLN
INTRAMUSCULAR | Status: AC
  Filled 2023-07-06: qty 30

## 2023-07-06 MED ORDER — LORAZEPAM 2 MG/ML IJ SOLN
0.5000 mg | Freq: Once | INTRAMUSCULAR | Status: AC
  Administered 2023-07-06: 0.5 mg via INTRAVENOUS
  Filled 2023-07-06: qty 1

## 2023-07-06 MED ORDER — HYDROXYZINE HCL 50 MG/ML IM SOLN: 25.0000 mg | Freq: Four times a day (QID) | INTRAMUSCULAR | Status: DC | PRN

## 2023-07-06 NOTE — Progress Notes (Signed)
This chaplain responded to RN consult for spiritual care in the setting of early morning fear.   The chaplain reviewed the chart notes and phoned the Pt. LPN-Porshe for an update. The chaplain understands the Pt. is declining a spiritual care visit at this time.  This chaplain is available for F/U spiritual care as needed.  Chaplain Stephanie Acre [phone]

## 2023-07-06 NOTE — Progress Notes (Signed)
HD#6 SUBJECTIVE:  Patient Summary: [name] is a 67 y.o. male with a pertinent PMH of severe deconditioning and malnutrition who is being treated for osteomyelitis of the right greater trochanter and aspiration pneumonia.  Overnight Events: Patient was anxious overnight, complaining of difficulty breathing and fear of dying.  He was given 0.5 mg of Ativan and his respiratory rate decreased.  He also complained of new onset chest pain 9/10.  Troponins were negative.  D-dimer was elevated. Pain was reproducible on palpation.  Interim History: Mr. Moline seems a little more somnolent this morning, and perseverated on not wanting to die.  He was also upset about the misunderstanding regarding comfort care.  He continues to endorse chest/upper abdominal pain, unchanged from last night, reproducible on palpation.  He still reports some shortness of breath, but was satting well on 4 L nasal cannula.  We reconfirmed his CODE STATUS and assured him that we are to actively treat his infections.  OBJECTIVE:  Vital Signs: Vitals:   07/06/23 0414 07/06/23 0545 07/06/23 0907 07/06/23 1100  BP: (!) 142/72 135/73 126/69   Pulse: (!) 122 (!) 113 (!) 103   Resp: (!) 26 20 20    Temp: 98 F (36.7 C) 97.9 F (36.6 C) 97.9 F (36.6 C)   TempSrc: Oral Oral Oral   SpO2: 96% 99% 99% 100%  Weight:      Height:       Supplemental O2: Nasal Cannula SpO2: 100 % O2 Flow Rate (L/min): 4 L/min  Filed Weights   06/29/23 2155 06/30/23 0502  Weight: 83.5 kg 82.6 kg     Intake/Output Summary (Last 24 hours) at 07/06/2023 1337 Last data filed at 07/06/2023 1100 Gross per 24 hour  Intake 0 ml  Output 1200 ml  Net -1200 ml   Net IO Since Admission: -2,931 mL [07/06/23 1337]  Physical Exam: General: ill-appearing, anxious Pulm: No increased work of breathing; rales on the left, right lung sounds clear Cardio: tachycardia, regular rhythm MSK: Swelling and right upper extremity resolved; still some  numbness/tingling the right hand; bilateral lower extremity contractures Skin: Dressing over right hip dry, no apparent bleeding or discharge  Patient Lines/Drains/Airways Status     Active Line/Drains/Airways     Name Placement date Placement time Site Days   PICC Single Lumen 06/24/23 Left Basilic 44 cm 0 cm 06/24/23  4259  Basilic  12   External Urinary Catheter 06/30/23  0430  --  6   Pressure Injury 04/30/23 Hip Anterior;Left;Proximal Stage 2 -  Partial thickness loss of dermis presenting as a shallow open injury with a red, pink wound bed without slough. close to R hip 04/30/23  2200  -- 67   Pressure Injury 04/30/23 Hip Anterior;Proximal;Right Stage 2 -  Partial thickness loss of dermis presenting as a shallow open injury with a red, pink wound bed without slough. 1/2 cm by 0.8cm by 0.2 cm 04/30/23  2200  -- 67   Pressure Injury 04/30/23 Heel Right;Left Stage 1 -  Intact skin with non-blanchable redness of a localized area usually over a bony prominence. soft pressure on heels bilaterally foams applied 04/30/23  2200  -- 67   Pressure Injury 06/19/23 Buttocks Right;Upper Unstageable - Full thickness tissue loss in which the base of the injury is covered by slough (yellow, tan, gray, green or brown) and/or eschar (tan, brown or black) in the wound bed. 06/19/23  1408  -- 17   Pressure Injury 06/19/23 Buttocks Right Unstageable -  Full thickness tissue loss in which the base of the injury is covered by slough (yellow, tan, gray, green or brown) and/or eschar (tan, brown or black) in the wound bed. 06/19/23  1408  -- 17   Pressure Injury 06/19/23 Buttocks Right Stage 2 -  Partial thickness loss of dermis presenting as a shallow open injury with a red, pink wound bed without slough. 06/19/23  1408  -- 17   Pressure Injury 06/19/23 Hip Left;Lateral Stage 2 -  Partial thickness loss of dermis presenting as a shallow open injury with a red, pink wound bed without slough. 06/19/23  1408  -- 17    Pressure Injury 06/19/23 Lumbar Lateral;Right Stage 2 -  Partial thickness loss of dermis presenting as a shallow open injury with a red, pink wound bed without slough. 06/19/23  1408  -- 17   Pressure Injury 06/19/23 Heel Left;Lateral Stage 4 - Full thickness tissue loss with exposed bone, tendon or muscle. 06/19/23  1408  -- 17   Pressure Injury 06/19/23 Foot Right;Lateral Unstageable - Full thickness tissue loss in which the base of the injury is covered by slough (yellow, tan, gray, green or brown) and/or eschar (tan, brown or black) in the wound bed. 06/19/23  1408  -- 17   Pressure Injury 06/19/23 Heel Right;Lateral Unstageable - Full thickness tissue loss in which the base of the injury is covered by slough (yellow, tan, gray, green or brown) and/or eschar (tan, brown or black) in the wound bed. 06/19/23  1408  -- 17   Pressure Injury 06/19/23 Heel Left Unstageable - Full thickness tissue loss in which the base of the injury is covered by slough (yellow, tan, gray, green or brown) and/or eschar (tan, brown or black) in the wound bed. 06/19/23  1408  -- 17   Wound / Incision (Open or Dehisced) 08/02/20 Sacrum Medial 08/02/20  1458  Sacrum  1068   Wound / Incision (Open or Dehisced) 08/03/20 Hip Left 08/03/20  1513  Hip  1067            Pertinent Labs:    Latest Ref Rng & Units 07/06/2023    3:52 AM 07/03/2023    2:19 AM 06/30/2023   12:28 AM  CBC  WBC 4.0 - 10.5 K/uL 11.1  8.9  10.2   Hemoglobin 13.0 - 17.0 g/dL 19.1  8.3  9.2   Hematocrit 39.0 - 52.0 % 32.3  27.0  30.1   Platelets 150 - 400 K/uL 620  484  426        Latest Ref Rng & Units 07/06/2023    3:52 AM 07/03/2023    2:19 AM 06/30/2023   12:28 AM  CMP  Glucose 70 - 99 mg/dL 478  295  621   BUN 8 - 23 mg/dL 12  16  14    Creatinine 0.61 - 1.24 mg/dL 3.08  6.57  8.46   Sodium 135 - 145 mmol/L 136  136  135   Potassium 3.5 - 5.1 mmol/L 3.5  3.4  3.8   Chloride 98 - 111 mmol/L 101  106  103   CO2 22 - 32 mmol/L 23  26  24     Calcium 8.9 - 10.3 mg/dL 8.3  7.9  8.3   Total Protein 6.5 - 8.1 g/dL 6.5     Total Bilirubin 0.3 - 1.2 mg/dL 0.7     Alkaline Phos 38 - 126 U/L 62     AST 15 - 41 U/L  23     ALT 0 - 44 U/L 16       Recent Labs    07/06/23 0409  GLUCAP 154*   D-dimer: 2.42  Troponin: 9, 11  CRP: 17.9 ESR: 75  CK: 38  Pertinent Imaging: CT Angio Chest Pulmonary Embolism (PE) W or WO Contrast  Result Date: 07/06/2023 CLINICAL DATA:  PE suspected EXAM: CT ANGIOGRAPHY CHEST WITH CONTRAST TECHNIQUE: Multidetector CT imaging of the chest was performed using the standard protocol during bolus administration of intravenous contrast. Multiplanar CT image reconstructions and MIPs were obtained to evaluate the vascular anatomy. RADIATION DOSE REDUCTION: This exam was performed according to the departmental dose-optimization program which includes automated exposure control, adjustment of the mA and/or kV according to patient size and/or use of iterative reconstruction technique. CONTRAST:  75mL OMNIPAQUE IOHEXOL 350 MG/ML SOLN COMPARISON:  07/02/2023 FINDINGS: Cardiovascular: Satisfactory opacification of the pulmonary arteries to the segmental level. No evidence of pulmonary embolism. Normal heart size. Left and right coronary artery calcifications. No pericardial effusion. Left upper extremity PICC. Mediastinum/Nodes: No enlarged mediastinal, hilar, or axillary lymph nodes. Thyroid gland, trachea, and esophagus demonstrate no significant findings. Lungs/Pleura: Unchanged moderate, loculated appearing left pleural effusion and trace right pleural effusion with associated atelectasis or consolidation. Moderate centrilobular and paraseptal emphysema, with increased, heterogeneous airspace opacity scattered throughout the remainder of the lungs (series 12, image 41). Upper Abdomen: No acute abnormality. Musculoskeletal: No chest wall abnormality. No acute osseous findings. Disc degenerative disease and bridging  osteophytosis throughout the thoracic spine, in keeping with DISH. Review of the MIP images confirms the above findings. IMPRESSION: 1. Negative examination for pulmonary embolism. 2. Unchanged moderate, loculated appearing left pleural effusion and trace right pleural effusion with associated atelectasis or consolidation. 3. Moderate centrilobular and paraseptal emphysema, with increased, heterogeneous airspace opacity scattered throughout the remainder of the lungs, consistent with worsened multifocal infection or aspiration. 4. Coronary artery disease. Emphysema (ICD10-J43.9). Electronically Signed   By: Jearld Lesch M.D.   On: 07/06/2023 10:34    ASSESSMENT/PLAN:  Assessment: Principal Problem:   Infected decubitus ulcer Active Problems:   Diabetes mellitus (HCC)   Protein-calorie malnutrition, severe   Osteomyelitis of right femur (HCC)   Goals of care, counseling/discussion   Osteomyelitis (HCC)   Plan: Aspiration Pneumonia He is satting at 99% on 4 L nasal cannula this morning with no increased work of breathing.  CTA chest redemonstrated the left lower lobe consolidation suspected for aspiration pneumonia.  He has remained afebrile with only mild leukocytosis. -Ceftriaxone and Flagyl (2 more days) -Continue oxygen; wean as tolerated -Chest PT  Osteomyelitis of right greater trochanter Secondary to an infected decubitus ulcer s/p surgical debridement.  ESR/CRP remain elevated; CK remains normal. No new changes in treatment of his osteomyelitis today. -IV daptomycin and ceftriaxone until 8/9 -Tylenol 1000 mg every 8 hours; oxycodone 10 mg every 3 hours for severe pain -Weekly CK, CBC with differential, CMP, ESR/CRP next Monday  Severe deconditioning and weakness We spoke with him this morning about his ongoing infections and are concerned that he is not going to recover well.  His prognosis is guarded given his severe deconditioning and bedbound status, which affect his ability to  heal from his decubitus ulcers and osteomyelitis.  On top of this, he has developed an aspiration pneumonia.  He has a cough with secretions, but he is not able to cough them up.  It is likely that his overall generalized weakness and bedbound positioning have contributed to  the development of this pneumonia and his poor ability to clear it.  We reconfirmed his code status with him and he expressed his wishes for [place] to continue with active treatment of these ongoing infections.   Anxiety He has had anxiety in the past and this has been worsening during his hospitalization.  He states he is most anxious about the possibility of dying.  He has been receiving small doses of Ativan PRN when his anxiety worsens, but we will add buspirone.  -Buspirone 5 mg 3 times daily -Ativan 0.5 mg q6h PRN -Spiritual care consult  Best Practice: Diet: Regular diet IVF: None VTE: enoxaparin (LOVENOX) injection 40 mg Start: 07/05/23 2200 Code: DNR AB: IV Daptomycin and Ceftriaxone until 8/9; Flagyl, for 2 more days Therapy Recs: SNF Family Contact: Conal Shetley, to be notified. DISPO: Anticipated discharge to Skilled nursing facility pending placement and medical stability.  Signature: Annett Fabian, MD  Internal Medicine Resident, PGY-1 [facility]  Pager: [phone] 1:37 PM, 07/06/2023   Please contact the on call pager after 5 pm and on weekends at [phone].

## 2023-07-06 NOTE — Progress Notes (Addendum)
Red MEWS  Pt reporting new chest pain 9/10, diaphoretic, tachypneic, was forgetful but back at baseline. Vital signs are as follows; Provider notified.   07/06/23 0414  Vitals  Temp 98 F (36.7 C)  Temp Source Oral  BP (!) 142/72  MAP (mmHg) 87  BP Location Right Arm  BP Method Automatic  Patient Position (if appropriate) Lying  Pulse Rate (!) 122  Pulse Rate Source Monitor  Resp (!) 26  MEWS COLOR  MEWS Score Color Red  Oxygen Therapy  SpO2 96 %  O2 Device Nasal Cannula  O2 Flow Rate (L/min) 4 L/min   EKG completed, labs drawn.

## 2023-07-06 NOTE — Progress Notes (Signed)
Pharmacy Antibiotic Note  [name] is a 67 y.o. male admitted on 06/29/2023 with  decubitus ulcer .  Pharmacy has been consulted for daptomycin dosing with serial weekly (Mondays) creatinine kinase levels.  Plan: Creatinine kinase value is  0.74 mg/dL, within normal range.Will continue daptomycin until 08/01/23 after a total of 27 doses administered.  Height: 5\' 10"  (177.8 cm) Weight: 82.6 kg (182 lb 1.6 oz) IBW/kg (Calculated) : 73  Temp (24hrs), Avg:98 F (36.7 C), Min:97.8 F (36.6 C), Max:98.3 F (36.8 C)  Recent Labs  Lab 06/29/23 2242 06/30/23 0028 07/03/23 0219 07/06/23 0352  WBC  --  10.2 8.9 11.1*  CREATININE 0.65 0.64 0.65 0.74    Estimated Creatinine Clearance: 95.1 mL/min (by C-G formula based on SCr of 0.74 mg/dL).    Allergies  Allergen Reactions   Lipitor [Atorvastatin Calcium] Other (See Comments)    Myalgias     Thank you for allowing pharmacy to be a part of this patient's care.  Elicia Lamp, PharmD, CPP 07/06/2023 6:48 AM

## 2023-07-06 NOTE — Procedures (Signed)
Insertion of Chest Tube Procedure Note  [name]  [id number]  [birth year]  Date:07/06/23  Time:5:36 PM    Provider Performing: Steffanie Dunn   Procedure: Pleural Catheter Insertion w/ Imaging Guidance (40981)  Indication(s) Effusion  Consent Risks of the procedure as well as the alternatives and risks of each were explained to the patient and/or caregiver.  Consent for the procedure was obtained and is signed in the bedside chart  Anesthesia Topical only with 1% lidocaine    Time Out Verified patient identification, verified procedure, site/side was marked, verified correct patient position, special equipment/implants available, medications/allergies/relevant history reviewed, required imaging and test results available.   Sterile Technique Maximal sterile technique including full sterile barrier drape, hand hygiene, sterile gown, sterile gloves, mask, hair covering, sterile ultrasound probe cover (if used).   Procedure Description Ultrasound used to identify appropriate pleural anatomy for placement and overlying skin marked. Area of placement cleaned and draped in sterile fashion.  A 14 French pigtail pleural catheter was placed into the left pleural space using Seldinger technique. Guidewire was confirmed in pleural fluid pocket before dilation and catheter placement. Appropriate return of fluid was obtained.  The tube was connected to atrium and placed on -20 cm H2O wall suction.  700cc fluid obtained before leaving the room.   Complications/Tolerance None; patient tolerated the procedure well. Chest X-ray is ordered to verify placement.   EBL Minimal  Specimen(s) fluid  Steffanie Dunn, DO 07/06/23 5:37 PM [facility]  For contact information, see Amion. If no response to pager, please call PCCM consult pager. After hours, 7PM- 7AM, please call Elink.

## 2023-07-06 NOTE — Progress Notes (Signed)
Pt currently refusing bed bath/wound care states he's finally getting some rest and wants to continue. CHG refused. Will notify incoming Nurse/CT.

## 2023-07-06 NOTE — Progress Notes (Signed)
Holding CPT on 1600 round due to patient getting ready to have procedure done at bedside.  Will continue to monitor.

## 2023-07-06 NOTE — Consult Note (Addendum)
NAME:  [name], MRN:  [id number], DOB:  July 08, 1956, LOS: 6 ADMISSION DATE:  06/29/2023, CONSULTATION DATE:  07/06/2023 REFERRING MD:  Dr. Toula Moos - IMTS, CHIEF COMPLAINT:  Pleural effusion    History of Present Illness:  [name] is a 67 y.o. male with a PMH significant for HTN, HLD, diabetes and failure to thrive who presented to the ED 06/29/2023 for request of placement assistance. During admission patient has been diagnosed with aspiration PNA with osteomyelitis of right greater trochanter and severe deconditioning.   AM 7/15 patient was complaining of worsening dyspnea with elevated D-dimer therefore CTA chest was obtained that again showed unchanged loculated left pleural effusion. PCCM consulted for assistance in management and possible need for chest tube.  Pertinent  Medical History   HTN, HLD, diabetes and failure to thrive   Significant Hospital Events: Including procedures, antibiotic start and stop dates in addition to other pertinent events   7/8 presented for assistance in placement  7/15 PCCM consulted, CTA chest repeated with  moderate left loculated pleural effusion   Interim History / Subjective:  As above   Objective   Blood pressure 126/69, pulse (!) 103, temperature 97.9 F (36.6 C), temperature source Oral, resp. rate 20, height 5\' 10"  (1.778 m), weight 82.6 kg, SpO2 100%.        Intake/Output Summary (Last 24 hours) at 07/06/2023 1436 Last data filed at 07/06/2023 1100 Gross per 24 hour  Intake 0 ml  Output 1200 ml  Net -1200 ml   Filed Weights   06/29/23 2155 06/30/23 0502  Weight: 83.5 kg 82.6 kg    Examination: General: ill appearing man lying in bed in NAD HEENT: [place]/AT, eyes anicteric Neuro: awake, globally weak but moving extremities. Not able to reposition himself in bed CV: S1S2, RRR PULM:  CTA anteriorly. No wheezing. Decreased left basilar breath sounds GI:  soft, NT Extremities: no cyanosis or edema Skin: warm, dry, no  rashes  Resolved Hospital Problem list     Assessment & Plan:  Moderate loculated left pleural effusion > concern for parapneumonic Acute respiratory failure with hypoxia Likely aspiration pneumonia - CTA chest repeated with  moderate left loculated pleural effusion  P: -Bedside US confirmed a pocked inferiorly; discussed risks, benefits, alternatives with him regarding chest tube placement. He consented verbally for placement. Will sign consent form with RN present.  -discussed that chest tube will need to say in at least 1-2 days.  Chest tube will need to remain to -20cm H2O suction. -pulmonary hygiene -wean O2 as able -fluid will be sent for analysis -agree with empiric ceftriaxone & flagyl. Daptomycin won't cover pulmonary infections, but it appears this  antibiotic is for skin coverage. -Aspiration precautions  CT chest personally reviewed> apical emphysema, basilar fibrosis, opacification of part of LLL with loculated effusion posteriorly and laterally.   Best Practice (right click and "Reselect all SmartList Selections" daily)  Per primary   Labs   CBC: Recent Labs  Lab 06/30/23 0028 07/03/23 0219 07/06/23 0352  WBC 10.2 8.9 11.1*  NEUTROABS  --   --  8.7*  HGB 9.2* 8.3* 10.0*  HCT 30.1* 27.0* 32.3*  MCV 92.0 90.6 89.2  PLT 426* 484* 620*    Basic Metabolic Panel: Recent Labs  Lab 06/29/23 2242 06/30/23 0028 07/03/23 0219 07/06/23 0352  NA 137 135 136 136  K 4.7 3.8 3.4* 3.5  CL 107 103 106 101  CO2 18* 24 26 23   GLUCOSE 109* 123* 148*  152*  BUN 18 14 16 12   CREATININE 0.65 0.64 0.65 0.74  CALCIUM 8.5* 8.3* 7.9* 8.3*   GFR: Estimated Creatinine Clearance: 95.1 mL/min (by C-G formula based on SCr of 0.74 mg/dL). Recent Labs  Lab 06/30/23 0028 07/03/23 0219 07/06/23 0352  WBC 10.2 8.9 11.1*    Liver Function Tests: Recent Labs  Lab 07/06/23 0352  AST 23  ALT 16  ALKPHOS 62  BILITOT 0.7  PROT 6.5  ALBUMIN 1.9*   No results for input(s):  "LIPASE", "AMYLASE" in the last 168 hours. No results for input(s): "AMMONIA" in the last 168 hours.  ABG    Component Value Date/Time   PHART 7.426 07/17/2020 0648   PCO2ART 29.1 (L) 07/17/2020 0648   PO2ART 79.0 (L) 07/17/2020 0648   HCO3 28.3 (H) 05/05/2023 1751   TCO2 17 (L) 06/18/2023 2027   ACIDBASEDEF 2.0 09/01/2022 2218   O2SAT 66 05/05/2023 1751     Coagulation Profile: No results for input(s): "INR", "PROTIME" in the last 168 hours.  Cardiac Enzymes: Recent Labs  Lab 07/06/23 0352  CKTOTAL 38*    HbA1C: Hgb A1c MFr Bld  Date/Time Value Ref Range Status  03/27/2023 08:46 PM 5.7 (H) 4.8 - 5.6 % Final    Comment:    (NOTE) Pre diabetes:          5.7%-6.4%  Diabetes:              >6.4%  Glycemic control for   <7.0% adults with diabetes   07/15/2020 03:00 PM 6.9 (H) 4.8 - 5.6 % Final    Comment:    (NOTE) Pre diabetes:          5.7%-6.4%  Diabetes:              >6.4%  Glycemic control for   <7.0% adults with diabetes     CBG: Recent Labs  Lab 07/06/23 0409  GLUCAP 154*    Review of Systems:   Please see the history of present illness. All other systems reviewed and are negative   Past Medical History:  He,  has a past medical history of Cellulitis (07/17/2020), Diabetes mellitus, Dyspnea, Hyperlipidemia, and Hypertension.   Surgical History:   Past Surgical History:  Procedure Laterality Date   BIOPSY  05/01/2023   Procedure: BIOPSY;  Surgeon: Beverley Fiedler, MD;  Location: [facility] ENDOSCOPY;  Service: Gastroenterology;;   ESOPHAGOGASTRODUODENOSCOPY (EGD) WITH PROPOFOL N/A 05/01/2023   Procedure: ESOPHAGOGASTRODUODENOSCOPY (EGD) WITH PROPOFOL;  Surgeon: Beverley Fiedler, MD;  Location: [facility] ENDOSCOPY;  Service: Gastroenterology;  Laterality: N/A;   FEMUR IM NAIL Left 03/27/2023   Procedure: INTRAMEDULLARY (IM) RETROGRADE FEMORAL NAILING;  Surgeon: Myrene Galas, MD;  Location: [facility] OR;  Service: Orthopedics;  Laterality: Left;   SPINE SURGERY        Social History:   reports that he has been smoking cigarettes. He has a 20 pack-year smoking history. He has never used smokeless tobacco. He reports that he does not drink alcohol and does not use drugs.   Family History:  His family history is not on file.   Allergies Allergies  Allergen Reactions   Lipitor [Atorvastatin Calcium] Other (See Comments)    Myalgias      Home Medications  Prior to Admission medications   Medication Sig Start Date End Date Taking? Authorizing Provider  cefTRIAXone (ROCEPHIN) IVPB Inject 2 g into the vein daily. Indication:  Decubitus ulcer/osteomyelitis  First Dose: Yes Last Day of Therapy:  07/31/23 Labs -  Once weekly:  CBC/D and BMP, Labs - Once weekly: ESR and CRP Method of administration: IV Push Method of administration may be changed at the discretion of home infusion pharmacist based upon assessment of the patient and/or caregiver's ability to self-administer the medication ordered. 06/29/23 08/05/23  Annett Fabian, MD  daptomycin (CUBICIN) IVPB Inject 650 mg into the vein daily. Indication:  Decubitus ulcer/osteomyelitis  First Dose: Yes Last Day of Therapy:  07/31/23 Labs - Once weekly:  CBC/D, BMP, and CPK Labs - Once weekly: ESR and CRP Method of administration: IV Push Method of administration may be changed at the discretion of home infusion pharmacist based upon assessment of the patient and/or caregiver's ability to self-administer the medication ordered. 06/29/23 08/05/23  Annett Fabian, MD  feeding supplement (ENSURE ENLIVE / ENSURE PLUS) LIQD Take 237 mLs by mouth 3 (three) times daily between meals. 06/29/23   Annett Fabian, MD  gabapentin (NEURONTIN) 800 MG tablet Take 800 mg by mouth 3 (three) times daily.    [provider]  LORazepam (ATIVAN) 0.5 MG tablet Take 1 tablet (0.5 mg total) by mouth every 8 (eight) hours as needed (nerve provlem). 05/10/23   Bethann Berkshire, MD  losartan (COZAAR) 25 MG tablet Take 25 mg by mouth  daily.    [provider]  methocarbamol (ROBAXIN) 500 MG tablet Take 1 tablet (500 mg total) by mouth 3 (three) times daily for 7 days. 06/29/23 07/06/23  Atway, Rayann N, DO  mirtazapine (REMERON SOL-TAB) 15 MG disintegrating tablet Take 1 tablet (15 mg total) by mouth at bedtime. 04/01/23 03/31/24  Morene Crocker, MD  Multiple Vitamin (MULTIVITAMIN WITH MINERALS) TABS tablet Take 1 tablet by mouth daily. 06/29/23   Annett Fabian, MD  nutrition supplement, JUVEN, (JUVEN) PACK Take 1 packet by mouth 2 (two) times daily between meals. 06/29/23 07/29/23  Annett Fabian, MD  oxyCODONE (OXY IR/ROXICODONE) 5 MG immediate release tablet Take 5 mg by mouth 2 (two) times daily. 05/29/23   [provider]  pantoprazole (PROTONIX) 40 MG tablet Take 1 tablet (40 mg total) by mouth 2 (two) times daily before a meal. Patient not taking: Reported on 05/09/2023 05/04/23 06/03/23  Willette Cluster, MD  senna-docusate (SENOKOT-S) 8.6-50 MG tablet Take 1 tablet by mouth at bedtime. 05/08/23   Willette Cluster, MD     Critical care time: NA  Whitney D. Harris, NP-C [facility] Personal contact information can be found on Amion  If no contact or response made please call [phone] 07/06/2023, 3:03 PM     Steffanie Dunn, DO 07/06/23 4:12 PM [facility]  For contact information, see Amion. If no response to pager, please call PCCM consult pager. After hours, 7PM- 7AM, please call Elink.

## 2023-07-06 NOTE — Progress Notes (Signed)
Pt tachypneic, anxious, reporting they can't breathe (O2 96% on 6L [place], RR 30, HR 111). Providers notified; at bedside.

## 2023-07-06 NOTE — Progress Notes (Signed)
Patient ID: [name], male   DOB: 1956-05-09, 67 y.o.   MRN: [id number]    Progress Note from the Palliative Medicine Team at [facility]   Patient Name: [name]        Date: 07/06/2023 DOB: 1956-01-08  Age: 67 y.o. MRN#: [id number] Attending Physician: Dickie La, MD Primary Care Physician: Pcp, No Admit Date: 06/29/2023    Extensive chart review has been completed prior to meeting with patient/family  including labs, vital signs, imaging, progress/consult notes, orders, medications and available advance directive documents.   67 y.o. male  admitted on 06/29/2023 with PMH of severe deconditioning and malnutrition, and admitted for osteomyelitis of R greater trochanter and aspiration pneumonia.     5 recent  hospitalizations.   Overall failure to thrive, albumin 1.7    Yesterday patient verbalized frustration with conversation with PMT regarding goals of care, patient remembers portions of the conversation, and adamantly requested to shift from comfort to a full medical support treatment plan, being open to all offered and available medical interventions to prolong life.   "  I do not want to be told I am dying".    This NP assessed patient at the bedside as a follow up palliative medicine needs and emotional support.    I spoke briefly to patient at bedside, he did not voice any questions or concerns but was not willing to engage in conversation today.    Education offered today regarding  the importance of continued conversation with family and their  medical providers regarding overall plan of care and treatment options,  ensuring decisions are within the context of the patients values and GOCs.  Questions and concerns addressed   Discussed with attending team.   PMT will sign off at this time, patient's goals are clearly set and plan will be to skilled nursing facility when medically stable.  Recommend outpatient palliative care to follow at facility.  Time:  25  minutes  Detailed review of medical records ( labs, imaging, vital signs), medically appropriate exam ( MS, skin, resp)   discussed with treatment team, counseling and education to patient, family, staff, documenting clinical information, medication management, coordination of care    Lorinda Creed NP  Palliative Medicine Team Team Phone # [phone] Pager [phone]

## 2023-07-06 NOTE — Progress Notes (Signed)
Assisted with chest tube insertion. Consent signed and in chart, pt verified, time out performed. Pigtail catheter placed to Left side with serous drainage noted. Atrium placed on -20cm suction. Pt tolerated procedure well, VSS. Pleural fluid collected and labeled for lab.   Primary RN notified of procedure and specimen to be sent down to lab.

## 2023-07-06 NOTE — Progress Notes (Cosign Needed)
   [name] is a 67 y.o. with severe deconditioning and malnutrition currently admitted for osteomyelitis of the R trochanter, infected decubitus ulcer, and aspiration pneumonia since 6/28. [name] was agitated most of the night, he has difficulty breathing but also is anxious about his prognosis. He is afraid of dying. His RR did go down with ativan. At around 4:30AM the nurse notified us of new onset chest pain. On evaluation, he said the chest pain is dull, does not radiate anywhere and not associated with any other new symptoms besides the SOB. No N/V. Rates the pain a 9/10. Reproducible upon palpation and movement. Wells Score is 3 (moderate risk). Of note, he had a similar event in the past. A CTA was done on 7/11 and did not show a PE. Pain was reproducible on palpation on exam. EKG, troponin, and d-dimer were ordered. The EKG showed sinus tachycardia, unchanged from prior EKG.  First troponin is 9 d-dimer 2.42. At this time there are no concerns for an acute MI. D-dimer could be elevated from infection. Case signed out to Dr. Sol Blazing.   Signature: [facility]  Internal Medicine Resident, PGY-1 [facility]  Pager: [phone] 4:39 AM, 07/06/2023   Please contact the on call pager after 5 pm and on weekends at [phone].

## 2023-07-06 NOTE — Care Management Important Message (Signed)
Important Message  Patient Details  Name: [name] MRN: [id number] Date of Birth: 09/18/1956   Medicare Important Message Given:  Yes     Sherilyn Banker 07/06/2023, 4:05 PM

## 2023-07-06 NOTE — Progress Notes (Signed)
Pt reports they are feeling much better since having the fluid removed/chest tube inserted. Pts affect/mood noted to be significantly calmer and pleasant.

## 2023-07-07 ENCOUNTER — Inpatient Hospital Stay (HOSPITAL_COMMUNITY): Payer: 59

## 2023-07-07 DIAGNOSIS — F419 Anxiety disorder, unspecified: Secondary | ICD-10-CM

## 2023-07-07 DIAGNOSIS — J189 Pneumonia, unspecified organism: Secondary | ICD-10-CM | POA: Diagnosis not present

## 2023-07-07 LAB — ALBUMIN: Albumin: 1.8 g/dL — ABNORMAL LOW (ref 3.5–5.0)

## 2023-07-07 LAB — LACTATE DEHYDROGENASE: LDH: 159 U/L (ref 98–192)

## 2023-07-07 MED ORDER — ENSURE ENLIVE PO LIQD
237.0000 mL | Freq: Two times a day (BID) | ORAL | Status: DC
  Administered 2023-07-07 – 2023-07-14 (×15): 237 mL via ORAL

## 2023-07-07 MED ORDER — JUVEN PO PACK
1.0000 | PACK | Freq: Two times a day (BID) | ORAL | Status: DC
  Administered 2023-07-07 – 2023-07-16 (×18): 1 via ORAL
  Filled 2023-07-07 (×18): qty 1

## 2023-07-07 NOTE — Progress Notes (Signed)
   07/07/23 0837  Assess: MEWS Score  Temp 97.7 F (36.5 C)  BP (!) 140/85  MAP (mmHg) 101  Pulse Rate (!) 141  Resp 16  SpO2 95 %  Assess: MEWS Score  MEWS Temp 0  MEWS Systolic 0  MEWS Pulse 3  MEWS RR 0  MEWS LOC 0  MEWS Score 3  MEWS Score Color Yellow  Assess: SIRS CRITERIA  SIRS Temperature  0  SIRS Pulse 1  SIRS Respirations  0  SIRS WBC 0  SIRS Score Sum  1   Will resume vitals every 4 hrs until green for 12 hrs. MD is aware from night nurse report.

## 2023-07-07 NOTE — Plan of Care (Signed)
  Problem: Education: Goal: Knowledge of General Education information will improve Description: Including pain rating scale, medication(s)/side effects and non-pharmacologic comfort measures Outcome: Progressing   Problem: Clinical Measurements: Goal: Ability to maintain clinical measurements within normal limits will improve Outcome: Progressing Goal: Diagnostic test results will improve Outcome: Progressing   Problem: Activity: Goal: Risk for activity intolerance will decrease Outcome: Progressing   Problem: Nutrition: Goal: Adequate nutrition will be maintained Outcome: Progressing   Problem: Coping: Goal: Level of anxiety will decrease Outcome: Progressing   Problem: Elimination: Goal: Will not experience complications related to urinary retention Outcome: Progressing

## 2023-07-07 NOTE — Progress Notes (Signed)
Physical Therapy Wound Treatment Patient Details  Name: [name] MRN: [id number] Date of Birth: May 23, 1956  Today's Date: 07/07/2023 Time: 4098-1191 Time Calculation (min): 33 min  Subjective  Subjective Assessment Subjective: What's the yellow mean? Patient and Family Stated Goals: For wound to heal Date of Onset:  (Present on admission) Prior Treatments: bedside debridement and wound therapy prior admission  Pain Score:   pre-medicated for pain; no pain indicated related to wound care  Wound Assessment  Pressure Injury 06/19/23 Buttocks Right;Upper Unstageable - Full thickness tissue loss in which the base of the injury is covered by slough (yellow, tan, gray, green or brown) and/or eschar (tan, brown or black) in the wound bed. (Active)  Wound Image   07/07/23 0939  Dressing Type Foam - Lift dressing to assess site every shift;Gauze (Comment);Normal saline moist dressing;Santyl 07/07/23 0939  Dressing Clean, Dry, Intact;Changed 07/07/23 0939  Dressing Change Frequency Twice a day 07/07/23 0939  State of Healing Non-healing 07/07/23 0939  Site / Wound Assessment Yellow;Pink 07/07/23 0939  % Wound base Red or Granulating 5% 07/07/23 0939  % Wound base Yellow/Fibrinous Exudate 95% 07/07/23 0939  % Wound base Black/Eschar 0% 07/03/23 1047  % Wound base Other/Granulation Tissue (Comment) 0% 07/01/23 1504  Peri-wound Assessment Intact 07/07/23 0939  Wound Length (cm) 3.7 cm 07/07/23 0939  Wound Width (cm) 2 cm 07/07/23 0939  Wound Depth (cm) 0.1 cm 07/07/23 0939  Wound Surface Area (cm^2) 7.4 cm^2 07/07/23 0939  Wound Volume (cm^3) 0.74 cm^3 07/07/23 0939  Tunneling (cm) 0 07/01/23 1504  Undermining (cm) 0 07/01/23 1504  Margins Unattached edges (unapproximated) 07/07/23 0939  Drainage Amount Scant 07/07/23 0939  Drainage Description Serous 07/07/23 0939  Treatment Debridement (Selective);Irrigation 07/07/23 0939     Pressure Injury 06/19/23 Buttocks Right Unstageable -  Full thickness tissue loss in which the base of the injury is covered by slough (yellow, tan, gray, green or brown) and/or eschar (tan, brown or black) in the wound bed. (Active)  Wound Image   07/07/23 0939  Dressing Type Foam - Lift dressing to assess site every shift;Gauze (Comment);Santyl;Barrier Film (skin prep);Other (Comment) 07/07/23 0939  Dressing Clean, Dry, Intact;Changed 07/07/23 0939  Dressing Change Frequency Twice a day 07/07/23 0939  State of Healing Early/partial granulation 07/07/23 0939  Site / Wound Assessment Yellow;Red;Black 07/07/23 0939  % Wound base Red or Granulating 95% 07/07/23 0939  % Wound base Yellow/Fibrinous Exudate 5% 07/07/23 0939  % Wound base Black/Eschar 0% 07/07/23 0939  % Wound base Other/Granulation Tissue (Comment) 0% 07/07/23 0939  Peri-wound Assessment Pink;Excoriated 07/07/23 0939  Wound Length (cm) 9.2 cm 07/07/23 0939  Wound Width (cm) 6.1 cm 07/07/23 0939  Wound Depth (cm) 1.3 cm 07/07/23 0939  Wound Surface Area (cm^2) 56.12 cm^2 07/07/23 0939  Wound Volume (cm^3) 72.96 cm^3 07/07/23 0939  Tunneling (cm) 0 07/01/23 1504  Undermining (cm) 12-7:00 deepest at 4:00 7.5cm 07/07/23 0939  Margins Unattached edges (unapproximated) 07/07/23 0939  Drainage Amount Copious 07/07/23 0939  Drainage Description Serosanguineous 07/07/23 0939  Treatment Debridement (Selective);Irrigation 07/07/23 0939      Selective Debridement (non-excisional) Selective Debridement (non-excisional) - Location: right buttocks proximal and distal wound Selective Debridement (non-excisional) - Tools Used: Forceps, Scalpel Selective Debridement (non-excisional) - Tissue Removed: Proximal wound mostly adherent but minimal amount of tissue able to be debrided. Distal wound with small amount of necrotic tissue at edge ~5:00    Wound Assessment and Plan  Wound Therapy - Assess/Plan/Recommendations Wound Therapy - Clinical Statement: Pt  presents to wound therapy with proximal  and distal R buttock wounds that were previously being seen by wound therapy last admission. Debridement mostly able to be completed at proximal wound. This patient will benefit from continued wound therapy for selective removal of unviable tissue, to decrease bioburden, and promote wound bed healing. Wound Therapy - Functional Problem List: immobility Factors Delaying/Impairing Wound Healing: Immobility Hydrotherapy Plan: Debridement, Dressing change, Patient/family education Wound Therapy - Frequency: 2X / week Wound Therapy - Follow Up Recommendations: dressing changes by RN  Wound Therapy Goals- Improve the function of patient's integumentary system by progressing the wound(s) through the phases of wound healing (inflammation - proliferation - remodeling) by: Wound Therapy Goals - Improve the function of patient's integumentary system by progressing the wound(s) through the phases of wound healing by: Decrease Necrotic Tissue to: 20% Decrease Necrotic Tissue - Progress: Progressing toward goal Increase Granulation Tissue to: 80% Increase Granulation Tissue - Progress: Progressing toward goal Goals/treatment plan/discharge plan were made with and agreed upon by patient/family: Yes Time For Goal Achievement: 7 days Wound Therapy - Potential for Goals: Fair  Goals will be updated until maximal potential achieved or discharge criteria met.  Discharge criteria: when goals achieved, discharge from hospital, MD decision/surgical intervention, no progress towards goals, refusal/missing three consecutive treatments without notification or medical reason.  GP     Charges PT Wound Care Charges $Wound Debridement up to 20 cm: < or equal to 20 cm $ Wound Debridement each add'l 20 sqcm: 1 $PT Hydrotherapy Visit: 1 Visit      [name], PT Acute Rehabilitation Services  Office [phone]   Zena Amos 07/07/2023, 9:47 AM

## 2023-07-07 NOTE — Plan of Care (Signed)
  Problem: Clinical Measurements: Goal: Diagnostic test results will improve Outcome: Progressing Goal: Respiratory complications will improve Outcome: Progressing   Problem: Nutrition: Goal: Adequate nutrition will be maintained Outcome: Not Progressing

## 2023-07-07 NOTE — Progress Notes (Addendum)
HD#7 SUBJECTIVE:  Patient Summary: [name] is a 67 y.o. male with a pertinent PMH of severe deconditioning and malnutrition who is being treated for osteomyelitis of the right greater trochanter and aspiration pneumonia.  Overnight Events: Tachycardic overnight into the 140s with no dyspnea or pain, self-resolved.  Interim History: Patient is doing okay this morning, still complaining of feeling terrible all over.  Says his shortness of breath is better after chest tube placement, down to 2 L nasal cannula satting 95%.  Was reporting some gluteal pain.  PT was present about to work with him.  OBJECTIVE:  Vital Signs: Vitals:   07/07/23 0355 07/07/23 0610 07/07/23 0636 07/07/23 0837  BP: (!) 137/59  (!) 121/58 (!) 140/85  Pulse: (!) 111 (!) 101 (!) 106 (!) 141  Resp: 18 18 18 16   Temp: 98.2 F (36.8 C)  97.9 F (36.6 C) 97.7 F (36.5 C)  TempSrc: Oral  Oral Oral  SpO2: 96% 95% 94% 95%  Weight:      Height:       Supplemental O2: Nasal Cannula SpO2: 95 % O2 Flow Rate (L/min): 2 L/min  Filed Weights   06/29/23 2155 06/30/23 0502  Weight: 83.5 kg 82.6 kg     Intake/Output Summary (Last 24 hours) at 07/07/2023 1303 Last data filed at 07/07/2023 0757 Gross per 24 hour  Intake 624 ml  Output 1600 ml  Net -976 ml   Net IO Since Admission: -3,907 mL [07/07/23 1303]  Physical Exam: General: Lying in bed, mildly uncomfortable Pulm: Normal pulmonary effort: Chest tube in place with serous discharge draining Cardio: Tachycardic, normal rhythm MSK: No acute changes; generalized weakness and lower extremity contractures  Patient Lines/Drains/Airways Status     Active Line/Drains/Airways     Name Placement date Placement time Site Days   PICC Single Lumen 06/24/23 Left Basilic 44 cm 0 cm 06/24/23  1610  Basilic  12   External Urinary Catheter 06/30/23  0430  --  6   Pressure Injury 04/30/23 Hip Anterior;Left;Proximal Stage 2 -  Partial thickness loss of dermis  presenting as a shallow open injury with a red, pink wound bed without slough. close to R hip 04/30/23  2200  -- 67   Pressure Injury 04/30/23 Hip Anterior;Proximal;Right Stage 2 -  Partial thickness loss of dermis presenting as a shallow open injury with a red, pink wound bed without slough. 1/2 cm by 0.8cm by 0.2 cm 04/30/23  2200  -- 67   Pressure Injury 04/30/23 Heel Right;Left Stage 1 -  Intact skin with non-blanchable redness of a localized area usually over a bony prominence. soft pressure on heels bilaterally foams applied 04/30/23  2200  -- 67   Pressure Injury 06/19/23 Buttocks Right;Upper Unstageable - Full thickness tissue loss in which the base of the injury is covered by slough (yellow, tan, gray, green or brown) and/or eschar (tan, brown or black) in the wound bed. 06/19/23  1408  -- 17   Pressure Injury 06/19/23 Buttocks Right Unstageable - Full thickness tissue loss in which the base of the injury is covered by slough (yellow, tan, gray, green or brown) and/or eschar (tan, brown or black) in the wound bed. 06/19/23  1408  -- 17   Pressure Injury 06/19/23 Buttocks Right Stage 2 -  Partial thickness loss of dermis presenting as a shallow open injury with a red, pink wound bed without slough. 06/19/23  1408  -- 17   Pressure Injury 06/19/23 Hip Left;Lateral  Stage 2 -  Partial thickness loss of dermis presenting as a shallow open injury with a red, pink wound bed without slough. 06/19/23  1408  -- 17   Pressure Injury 06/19/23 Lumbar Lateral;Right Stage 2 -  Partial thickness loss of dermis presenting as a shallow open injury with a red, pink wound bed without slough. 06/19/23  1408  -- 17   Pressure Injury 06/19/23 Heel Left;Lateral Stage 4 - Full thickness tissue loss with exposed bone, tendon or muscle. 06/19/23  1408  -- 17   Pressure Injury 06/19/23 Foot Right;Lateral Unstageable - Full thickness tissue loss in which the base of the injury is covered by slough (yellow, tan, gray, green or  brown) and/or eschar (tan, brown or black) in the wound bed. 06/19/23  1408  -- 17   Pressure Injury 06/19/23 Heel Right;Lateral Unstageable - Full thickness tissue loss in which the base of the injury is covered by slough (yellow, tan, gray, green or brown) and/or eschar (tan, brown or black) in the wound bed. 06/19/23  1408  -- 17   Pressure Injury 06/19/23 Heel Left Unstageable - Full thickness tissue loss in which the base of the injury is covered by slough (yellow, tan, gray, green or brown) and/or eschar (tan, brown or black) in the wound bed. 06/19/23  1408  -- 17   Wound / Incision (Open or Dehisced) 08/02/20 Sacrum Medial 08/02/20  1458  Sacrum  1068   Wound / Incision (Open or Dehisced) 08/03/20 Hip Left 08/03/20  1513  Hip  1067            Pertinent Labs:    Latest Ref Rng & Units 07/06/2023    3:52 AM 07/03/2023    2:19 AM 06/30/2023   12:28 AM  CBC  WBC 4.0 - 10.5 K/uL 11.1  8.9  10.2   Hemoglobin 13.0 - 17.0 g/dL 09.3  8.3  9.2   Hematocrit 39.0 - 52.0 % 32.3  27.0  30.1   Platelets 150 - 400 K/uL 620  484  426        Latest Ref Rng & Units 07/06/2023    3:52 AM 07/03/2023    2:19 AM 06/30/2023   12:28 AM  CMP  Glucose 70 - 99 mg/dL 235  573  220   BUN 8 - 23 mg/dL 12  16  14    Creatinine 0.61 - 1.24 mg/dL 2.54  2.70  6.23   Sodium 135 - 145 mmol/L 136  136  135   Potassium 3.5 - 5.1 mmol/L 3.5  3.4  3.8   Chloride 98 - 111 mmol/L 101  106  103   CO2 22 - 32 mmol/L 23  26  24    Calcium 8.9 - 10.3 mg/dL 8.3  7.9  8.3   Total Protein 6.5 - 8.1 g/dL 6.5     Total Bilirubin 0.3 - 1.2 mg/dL 0.7     Alkaline Phos 38 - 126 U/L 62     AST 15 - 41 U/L 23     ALT 0 - 44 U/L 16       Recent Labs    07/06/23 0409  GLUCAP 154*    Pertinent Imaging:  Chest x-ray: No acute changes.  Kinking of chest tube noted at chest wall.  ASSESSMENT/PLAN:  Assessment: Principal Problem:   Infected decubitus ulcer Active Problems:   Diabetes mellitus (HCC)   Protein-calorie  malnutrition, severe   Osteomyelitis of right femur (HCC)   Goals  of care, counseling/discussion   Osteomyelitis (HCC)   Aspiration pneumonia of left lower lobe (HCC)   Plan: Aspiration Pneumonia PCCM was consulted and a chest tube was placed yesterday with 1 L of serous output.  Fluid analysis shows an exudative effusion, likely parapneumonic versus neoplastic; given absence of evidence of malignancy on previous chest CT, most likely pneumonia. Cultures pending. -Ceftriaxone and Flagyl -Wean O2 as tolerated -PCCM following  Osteomyelitis of right greater trochanter No acute changes. Continue management/treatment of his osteomyelitis. -IV daptomycin and ceftriaxone until 8/9 -Wound care and frequent offloading -Tylenol 1000 mg every 8 hours; oxycodone 10 mg every 3 hours for severe pain -CK, CBC with differential, CMP, ESR/CRP next Monday  Severe deconditioning and weakness Patient has been intermittently refusing offloading and turning, as well as dressing changes by the nurses.  We are worried about his nutrition and its effects on his recovery from his multiple infections and wounds.  -Added Ensure and Juven twice daily between meals -Continue offloading and turning -PT wound care daily  Anxiety No new changes since yesterday.  Patient refused a spiritual care consult, and given his frustration with previous conversations, we feel it would be better to hold off on further palliative care discussions at this time. -Buspirone 5 mg 3 times daily -Hydroxyzine 25 mg PRN -Discontinued Ativan  Best Practice: Diet: Regular diet IVF: None VTE: enoxaparin (LOVENOX) injection 40 mg Start: 07/05/23 2200 Code: DNR AB: IV Daptomycin and Ceftriaxone until 8/9; Flagyl Therapy Recs: SNF Family Contact: Elissa Hefty, to be notified. DISPO: Anticipated discharge to Skilled nursing facility pending placement and medical stability.  Signature: Annett Fabian, MD  Internal Medicine  Resident, PGY-1 [facility]  Pager: [phone] 1:03 PM, 07/07/2023   Please contact the on call pager after 5 pm and on weekends at [phone].

## 2023-07-07 NOTE — Progress Notes (Signed)
Pt continues to refuse repositioning, bed bath and CHG.

## 2023-07-07 NOTE — Progress Notes (Signed)
   07/07/23 1959  Assess: MEWS Score  Temp 97.7 F (36.5 C)  BP (!) 150/68  MAP (mmHg) 89  Pulse Rate (!) 135  Resp 18  SpO2 93 %  O2 Device Nasal Cannula  Assess: MEWS Score  MEWS Temp 0  MEWS Systolic 0  MEWS Pulse 3  MEWS RR 0  MEWS LOC 0  MEWS Score 3  MEWS Score Color Yellow  Assess: if the MEWS score is Yellow or Red  Were vital signs taken at a resting state? Yes  Focused Assessment Change from prior assessment (see assessment flowsheet)  Does the patient meet 2 or more of the SIRS criteria? Yes  Does the patient have a confirmed or suspected source of infection? No  MEWS guidelines implemented  Yes, yellow  Treat  MEWS Interventions Considered administering scheduled or prn medications/treatments as ordered  Take Vital Signs  Increase Vital Sign Frequency  Yellow: Q2hr x1, continue Q4hrs until patient remains green for 12hrs  Escalate  MEWS: Escalate Yellow: Discuss with charge nurse and consider notifying provider and/or RRT  Notify: Charge Nurse/RN  Name of Charge Nurse/RN Notified Windell Moulding RN  Assess: SIRS CRITERIA  SIRS Temperature  0  SIRS Pulse 1  SIRS Respirations  0  SIRS WBC 0  SIRS Score Sum  1

## 2023-07-08 ENCOUNTER — Inpatient Hospital Stay (HOSPITAL_COMMUNITY): Payer: 59

## 2023-07-08 DIAGNOSIS — Z4682 Encounter for fitting and adjustment of non-vascular catheter: Secondary | ICD-10-CM

## 2023-07-08 DIAGNOSIS — J90 Pleural effusion, not elsewhere classified: Secondary | ICD-10-CM | POA: Diagnosis not present

## 2023-07-08 LAB — CYTOLOGY - NON PAP

## 2023-07-08 MED ORDER — METRONIDAZOLE 500 MG PO TABS
500.0000 mg | ORAL_TABLET | Freq: Three times a day (TID) | ORAL | Status: AC
  Administered 2023-07-08 – 2023-07-09 (×5): 500 mg via ORAL
  Filled 2023-07-08 (×6): qty 1

## 2023-07-08 MED ORDER — VITAMIN C 500 MG PO TABS
500.0000 mg | ORAL_TABLET | Freq: Every day | ORAL | Status: DC
  Administered 2023-07-08 – 2023-07-21 (×13): 500 mg via ORAL
  Filled 2023-07-08 (×14): qty 1

## 2023-07-08 MED ORDER — ZINC SULFATE 220 (50 ZN) MG PO CAPS
220.0000 mg | ORAL_CAPSULE | Freq: Every day | ORAL | Status: AC
  Administered 2023-07-08 – 2023-07-21 (×13): 220 mg via ORAL
  Filled 2023-07-08 (×14): qty 1

## 2023-07-08 NOTE — Progress Notes (Signed)
HD#8 SUBJECTIVE:  Patient Summary: [name] is a 67 y.o. male with a pertinent PMH of severe deconditioning and malnutrition who is being treated for osteomyelitis of the right greater trochanter and aspiration pneumonia.  Overnight Events: None  Interim History: He still complaining of some shortness of breath and had difficulty sleeping last night.  Currently on 3 L nasal cannula and satting well.  No increased work of breathing.  Says he has been coughing but has been unable to cough anything up.  Requested that nursing don't come in at night/early morning to wake him up for dressing changes.  OBJECTIVE:  Vital Signs: Vitals:   07/07/23 1959 07/07/23 2351 07/08/23 0410 07/08/23 0839  BP: (!) 150/68 (!) 145/68 133/72 132/65  Pulse: (!) 135 (!) 109 95 91  Resp: 18 19 16 17   Temp: 97.7 F (36.5 C) 97.8 F (36.6 C) 98.6 F (37 C) (!) 97.5 F (36.4 C)  TempSrc: Oral Oral Oral Oral  SpO2: 93% 99% 99% 100%  Weight:      Height:       Supplemental O2: Nasal Cannula SpO2: 100 % O2 Flow Rate (L/min): 3 L/min  Filed Weights   06/29/23 2155 06/30/23 0502  Weight: 83.5 kg 82.6 kg     Intake/Output Summary (Last 24 hours) at 07/08/2023 1158 Last data filed at 07/08/2023 0400 Gross per 24 hour  Intake 122 ml  Output 700 ml  Net -578 ml   Net IO Since Admission: -4,485 mL [07/08/23 1158]  Physical Exam: General: Laying in bed, no acute distress Pulm: Normal respiratory rate and effort; chest tube still in place on left, draining serous fluid Cardio: Normal rate and rhythm MSK: No acute changes; generalized weakness and lower extremity contractures  Patient Lines/Drains/Airways Status     Active Line/Drains/Airways     Name Placement date Placement time Site Days   PICC Single Lumen 06/24/23 Left Basilic 44 cm 0 cm 06/24/23  6962  Basilic  12   External Urinary Catheter 06/30/23  0430  --  6   Pressure Injury 04/30/23 Hip Anterior;Left;Proximal Stage 2 -  Partial  thickness loss of dermis presenting as a shallow open injury with a red, pink wound bed without slough. close to R hip 04/30/23  2200  -- 67   Pressure Injury 04/30/23 Hip Anterior;Proximal;Right Stage 2 -  Partial thickness loss of dermis presenting as a shallow open injury with a red, pink wound bed without slough. 1/2 cm by 0.8cm by 0.2 cm 04/30/23  2200  -- 67   Pressure Injury 04/30/23 Heel Right;Left Stage 1 -  Intact skin with non-blanchable redness of a localized area usually over a bony prominence. soft pressure on heels bilaterally foams applied 04/30/23  2200  -- 67   Pressure Injury 06/19/23 Buttocks Right;Upper Unstageable - Full thickness tissue loss in which the base of the injury is covered by slough (yellow, tan, gray, green or brown) and/or eschar (tan, brown or black) in the wound bed. 06/19/23  1408  -- 17   Pressure Injury 06/19/23 Buttocks Right Unstageable - Full thickness tissue loss in which the base of the injury is covered by slough (yellow, tan, gray, green or brown) and/or eschar (tan, brown or black) in the wound bed. 06/19/23  1408  -- 17   Pressure Injury 06/19/23 Buttocks Right Stage 2 -  Partial thickness loss of dermis presenting as a shallow open injury with a red, pink wound bed without slough. 06/19/23  1408  --  17   Pressure Injury 06/19/23 Hip Left;Lateral Stage 2 -  Partial thickness loss of dermis presenting as a shallow open injury with a red, pink wound bed without slough. 06/19/23  1408  -- 17   Pressure Injury 06/19/23 Lumbar Lateral;Right Stage 2 -  Partial thickness loss of dermis presenting as a shallow open injury with a red, pink wound bed without slough. 06/19/23  1408  -- 17   Pressure Injury 06/19/23 Heel Left;Lateral Stage 4 - Full thickness tissue loss with exposed bone, tendon or muscle. 06/19/23  1408  -- 17   Pressure Injury 06/19/23 Foot Right;Lateral Unstageable - Full thickness tissue loss in which the base of the injury is covered by slough  (yellow, tan, gray, green or brown) and/or eschar (tan, brown or black) in the wound bed. 06/19/23  1408  -- 17   Pressure Injury 06/19/23 Heel Right;Lateral Unstageable - Full thickness tissue loss in which the base of the injury is covered by slough (yellow, tan, gray, green or brown) and/or eschar (tan, brown or black) in the wound bed. 06/19/23  1408  -- 17   Pressure Injury 06/19/23 Heel Left Unstageable - Full thickness tissue loss in which the base of the injury is covered by slough (yellow, tan, gray, green or brown) and/or eschar (tan, brown or black) in the wound bed. 06/19/23  1408  -- 17   Wound / Incision (Open or Dehisced) 08/02/20 Sacrum Medial 08/02/20  1458  Sacrum  1068   Wound / Incision (Open or Dehisced) 08/03/20 Hip Left 08/03/20  1513  Hip  1067            Pertinent Labs:    Latest Ref Rng & Units 07/06/2023    3:52 AM 07/03/2023    2:19 AM 06/30/2023   12:28 AM  CBC  WBC 4.0 - 10.5 K/uL 11.1  8.9  10.2   Hemoglobin 13.0 - 17.0 g/dL 66.4  8.3  9.2   Hematocrit 39.0 - 52.0 % 32.3  27.0  30.1   Platelets 150 - 400 K/uL 620  484  426        Latest Ref Rng & Units 07/06/2023    3:52 AM 07/03/2023    2:19 AM 06/30/2023   12:28 AM  CMP  Glucose 70 - 99 mg/dL 403  474  259   BUN 8 - 23 mg/dL 12  16  14    Creatinine 0.61 - 1.24 mg/dL 5.63  8.75  6.43   Sodium 135 - 145 mmol/L 136  136  135   Potassium 3.5 - 5.1 mmol/L 3.5  3.4  3.8   Chloride 98 - 111 mmol/L 101  106  103   CO2 22 - 32 mmol/L 23  26  24    Calcium 8.9 - 10.3 mg/dL 8.3  7.9  8.3   Total Protein 6.5 - 8.1 g/dL 6.5     Total Bilirubin 0.3 - 1.2 mg/dL 0.7     Alkaline Phos 38 - 126 U/L 62     AST 15 - 41 U/L 23     ALT 0 - 44 U/L 16       Recent Labs    07/06/23 0409  GLUCAP 154*   Pleural fluid culture: no growth after 2 days  Pertinent Imaging:  Chest x-ray: No changes from prior  ASSESSMENT/PLAN:  Assessment: Principal Problem:   Infected decubitus ulcer Active Problems:   Diabetes  mellitus (HCC)   Protein-calorie malnutrition, severe  Osteomyelitis of right femur (HCC)   Goals of care, counseling/discussion   Osteomyelitis (HCC)   Aspiration pneumonia of left lower lobe (HCC)   Parapneumonic effusion   Plan: Aspiration Pneumonia Total chest tube output was 400 mL serous fluid yesterday.  Chest x-ray this morning showed no significant change from prior.  He is satting well on 3 L this morning with normal respiratory rate and effort, so we will try to wean his oxygen down today and see how he does. -Extend Flagyl for 2 more days, totaling a 7-day course; continue ceftriaxone -Wean O2 as tolerated -PCCM following  Osteomyelitis of right greater trochanter No acute changes. Continue management/treatment of his osteomyelitis. -IV daptomycin and ceftriaxone until 8/9 -Wound care and frequent offloading -Tylenol 1000 mg every 8 hours; oxycodone 10 mg every 3 hours for severe pain -CK, CBC with differential, CMP, ESR/CRP next Monday  Severe deconditioning and weakness He is requesting that we do not change his dressings at night or early in the morning, so we will coordinate with nursing to change them during the day.  He says he has been eating, we encouraged him to continue to eat and drink as much as he is able.  He drank both ensures yesterday. -Continue nutritional supplementation -Frequent offloading and turning -PT wound care daily, changed to 8am  Anxiety No acute changes since yesterday or updates to management plan. -Buspirone 5 mg 3 times daily -Hydroxyzine 25 mg PRN  Best Practice: Diet: Regular diet IVF: None VTE: enoxaparin (LOVENOX) injection 40 mg Start: 07/05/23 2200 Code: DNR AB: IV Daptomycin and Ceftriaxone until 8/9; Flagyl Therapy Recs: SNF Family Contact: Elissa Hefty, to be notified. DISPO: Anticipated discharge to Skilled nursing facility pending placement and medical stability.  Signature: Annett Fabian, MD  Internal Medicine  Resident, PGY-1 [facility]  Pager: [phone] 11:58 AM, 07/08/2023   Please contact the on call pager after 5 pm and on weekends at [phone].

## 2023-07-08 NOTE — Progress Notes (Signed)
NAME:  [name], MRN:  [id number], DOB:  [birth year], LOS: 8 ADMISSION DATE:  06/29/2023, CONSULTATION DATE:  07/06/2023 REFERRING MD:  Dr. Toula Moos - IMTS, CHIEF COMPLAINT:  Pleural effusion    History of Present Illness:  [name] is a 67 y.o. male with a PMH significant for HTN, HLD, diabetes and failure to thrive who presented to the ED 06/29/2023 for request of placement assistance. During admission patient has been diagnosed with aspiration PNA with osteomyelitis of right greater trochanter and severe deconditioning.   AM 7/15 patient was complaining of worsening dyspnea with elevated D-dimer therefore CTA chest was obtained that again showed unchanged loculated left pleural effusion. PCCM consulted for assistance in management and possible need for chest tube.  Pertinent  Medical History   HTN, HLD, diabetes and failure to thrive   Significant Hospital Events: Including procedures, antibiotic start and stop dates in addition to other pertinent events   7/8 presented for assistance in placement  7/15 PCCM consulted, CTA chest repeated with  moderate left loculated pleural effusion  07/06/2023 left chest tube placed  Interim History / Subjective:  As above   Objective   Blood pressure 133/72, pulse 95, temperature 98.6 F (37 C), temperature source Oral, resp. rate 16, height 5\' 10"  (1.778 m), weight 82.6 kg, SpO2 99%.        Intake/Output Summary (Last 24 hours) at 07/08/2023 0827 Last data filed at 07/08/2023 0400 Gross per 24 hour  Intake 122 ml  Output 700 ml  Net -578 ml   Filed Weights   06/29/23 2155 06/30/23 0502  Weight: 83.5 kg 82.6 kg    Examination: Ill appearing male who reports burning with chest tube flush No JVD or lymphadenopathy is appreciated Diminished breath sounds throughout Heart sounds are distant but regular Abdomen soft nontender Lower extremities with edema and space boots Left chest tube with no air leak nonfluctuating with recent 200 cc  of fluid with marked.  Resolved Hospital Problem list     Assessment & Plan:  Moderate loculated left pleural effusion > concern for parapneumonic Acute respiratory failure with hypoxia Likely aspiration pneumonia - CTA chest repeated with  moderate left loculated pleural effusion  P: Left chest tube placed without problem 07/06/2023 700 cc of fluid obtained at initial placement of chest tube 400 cc on 07/07/2023 200 cc on 07/08/2023 He does complain of burning with flushing of chest tube chest x-ray reviewed and chest tube appears to be in appropriate position. Consider DC and chest tube as drainage decreases. Continue antimicrobial therapy Pleural fluid cytology is pendin appearance of fluid was cloudy with total nucleated cell count of 5250    Best Practice (right click and "Reselect all SmartList Selections" daily)  Per primary   Labs   CBC: Recent Labs  Lab 07/03/23 0219 07/06/23 0352  WBC 8.9 11.1*  NEUTROABS  --  8.7*  HGB 8.3* 10.0*  HCT 27.0* 32.3*  MCV 90.6 89.2  PLT 484* 620*    Basic Metabolic Panel: Recent Labs  Lab 07/03/23 0219 07/06/23 0352  NA 136 136  K 3.4* 3.5  CL 106 101  CO2 26 23  GLUCOSE 148* 152*  BUN 16 12  CREATININE 0.65 0.74  CALCIUM 7.9* 8.3*   GFR: Estimated Creatinine Clearance: 95.1 mL/min (by C-G formula based on SCr of 0.74 mg/dL). Recent Labs  Lab 07/03/23 0219 07/06/23 0352  WBC 8.9 11.1*    Liver Function Tests: Recent Labs  Lab 07/06/23 [phone]  07/07/23 0311  AST 23  --   ALT 16  --   ALKPHOS 62  --   BILITOT 0.7  --   PROT 6.5  --   ALBUMIN 1.9* 1.8*   No results for input(s): "LIPASE", "AMYLASE" in the last 168 hours. No results for input(s): "AMMONIA" in the last 168 hours.  ABG    Component Value Date/Time   PHART 7.426 07/17/2020 0648   PCO2ART 29.1 (L) 07/17/2020 0648   PO2ART 79.0 (L) 07/17/2020 0648   HCO3 28.3 (H) 05/05/2023 1751   TCO2 17 (L) 06/18/2023 2027   ACIDBASEDEF 2.0 09/01/2022  2218   O2SAT 66 05/05/2023 1751     Coagulation Profile: No results for input(s): "INR", "PROTIME" in the last 168 hours.  Cardiac Enzymes: Recent Labs  Lab 07/06/23 0352  CKTOTAL 38*    HbA1C: Hgb A1c MFr Bld  Date/Time Value Ref Range Status  03/27/2023 08:46 PM 5.7 (H) 4.8 - 5.6 % Final    Comment:    (NOTE) Pre diabetes:          5.7%-6.4%  Diabetes:              >6.4%  Glycemic control for   <7.0% adults with diabetes   07/15/2020 03:00 PM 6.9 (H) 4.8 - 5.6 % Final    Comment:    (NOTE) Pre diabetes:          5.7%-6.4%  Diabetes:              >6.4%  Glycemic control for   <7.0% adults with diabetes     CBG: Recent Labs  Lab 07/06/23 0409  GLUCAP 154Brett Canales Charlese Gruetzmacher ACNP Acute Care Nurse Practitioner [facility] Pulmonary/Critical Care Please consult Amion 07/08/2023, 8:27 AM

## 2023-07-08 NOTE — Progress Notes (Signed)
Initial Nutrition Assessment  DOCUMENTATION CODES:   Severe malnutrition in context of acute illness/injury  INTERVENTION:  - Continue Ensure Enlive po TID, each supplement provides 350 kcal and 20 grams of protein.  - Continue -1 packet Juven BID, each packet provides 95 calories, 2.5 grams of protein (collagen), and 9.8 grams of carbohydrate (3 grams sugar); also contains 7 grams of L-arginine and L-glutamine, 300 mg vitamin C, 15 mg vitamin E, 1.2 mcg vitamin B-12, 9.5 mg zinc, 200 mg calcium, and 1.5 g  Calcium Beta-hydroxy-Beta-methylbutyrate to support wound healing  - Continue MVI.   - Add Vit C q day.   - Add Zinc x14 days.   NUTRITION DIAGNOSIS:   Severe Malnutrition related to acute illness as evidenced by severe fat depletion, severe muscle depletion, energy intake < 75% for > 7 days.  GOAL:   Patient will meet greater than or equal to 90% of their needs  MONITOR:   PO intake, Supplement acceptance  REASON FOR ASSESSMENT:   Malnutrition Screening Tool   ASSESSMENT:   67 y.o. male admits related to infected decubitus ulcer. PMH includes: HTN, DM, HLD, paraplegia, severe deconditioning. Pt is currently receiving medical management related to infected decubitus ulcer and aspiration pneumonia.  Meds reviewed: cozaar, flagyl, MVI, Juven BID, senokot. Labs reviewed: WDL.   Pt is oriented x2 and speech is garbled. The pt is currently on 3 L nasal cannula. Pt with L lateral chest tube. The pt was somewhat difficult to understand. He had lunch tray on bedside table. RD helped set up pt's lunch tray and got him an Ensure. Pt is on regular diet and was seen by SLP on 07/03/23.   Pt has not been eating well since admission (9 days) and RN reports that the pt is only drinking about 50% of his Ensure shakes but is drinking the Juven. Pt is not meeting his nutritional needs at this time. Pt's needs are increased related to wound healing due to several pressure injuries. RD  discussed with MD the need for Cortrak to help pt meet his nutritional needs until appetite and intake improve or a long-term plan for adequate nutrition can be established. Pt verbalized to MD and RD that he does not want a Cortrak tube. RD discussed with the pt that his intakes would need to drastically improve and he would need to start drinking all of his Ensure shakes as well. Pt verbalized understanding and seemed encouraged to start eating. RD will continue to monitor PO intakes.   If intakes do not improve within 24-48 hrs, consider moving forward with Cortrak placement.   NUTRITION - FOCUSED PHYSICAL EXAM:  Flowsheet Row Most Recent Value  Orbital Region Moderate depletion  Upper Arm Region Mild depletion  Thoracic and Lumbar Region Severe depletion  Buccal Region Moderate depletion  Temple Region Severe depletion  Clavicle Bone Region Severe depletion  Clavicle and Acromion Bone Region Severe depletion  Scapular Bone Region Moderate depletion  Dorsal Hand Mild depletion  Patellar Region Moderate depletion  Anterior Thigh Region Moderate depletion  Posterior Calf Region Moderate depletion  Edema (RD Assessment) Mild  Hair Reviewed  Eyes Reviewed  Mouth Reviewed  Skin Reviewed  Nails Reviewed       Diet Order:   Diet Order             Diet regular Room service appropriate? Yes; Fluid consistency: Thin  Diet effective now  EDUCATION NEEDS:   Not appropriate for education at this time  Skin:  Skin Assessment: Skin Integrity Issues: Skin Integrity Issues:: Stage IV Stage II: R buttocks; L Hip; R lateral lumbar Stage IV: L lateral heel Unstageable: R upper buttocks; R lateral foot; R lateral heel; L heel  Last BM:  7/16 - type 5  Height:   Ht Readings from Last 1 Encounters:  06/30/23 5\' 10"  (1.778 m)    Weight:   Wt Readings from Last 1 Encounters:  06/30/23 82.6 kg    Ideal Body Weight:     BMI:  Body mass index is 26.13  kg/m.  Estimated Nutritional Needs:   Kcal:  5284-1324 kcals  Protein:  125-145 gm  Fluid:  >/= 2 L/day  Bethann Humble, RD, LDN, CNSC.

## 2023-07-08 NOTE — Progress Notes (Signed)
Patient refused dressing changes stated he wanted to sleep. Report to oncoming nurse to change dressings once patient is awake.

## 2023-07-08 NOTE — Plan of Care (Signed)
  Problem: Education: Goal: Knowledge of General Education information will improve Description: Including pain rating scale, medication(s)/side effects and non-pharmacologic comfort measures Outcome: Progressing   Problem: Clinical Measurements: Goal: Will remain free from infection Outcome: Progressing   Problem: Activity: Goal: Risk for activity intolerance will decrease Outcome: Progressing   Problem: Elimination: Goal: Will not experience complications related to urinary retention Outcome: Progressing

## 2023-07-08 NOTE — TOC Progression Note (Signed)
Transition of Care [facility]) - Progression Note    Patient Details  Name: [name] MRN: [id number] Date of Birth: 07-24-1956  Transition of [facility]) CM/SW Contact  Axavier Pressley Aris [place], [place] Phone Number: 07/08/2023, 3:26 PM  Clinical Narrative:     CSW received call from [facility] with [facility] in [place] and is informed they received a referral for their program for pt. Pt wouldn't be eligible for their program until he completed rehab an returned home and then he could be evaluated for their program. CSW provided update that plan is for pt to DC to SNF for short term rehab once medically stable. Yvonna Alanis provides her contact [phone]  Expected Discharge Plan: Skilled Nursing Facility Barriers to Discharge: Continued Medical Work up, English as a second language teacher  Expected Discharge Plan and Services                                               Social Determinants of Health (SDOH) Interventions SDOH Screenings   Food Insecurity: No Food Insecurity (06/30/2023)  Recent Concern: Food Insecurity - Food Insecurity Present (06/24/2023)  Housing: Low Risk  (06/30/2023)  Transportation Needs: No Transportation Needs (06/30/2023)  Utilities: Not At Risk (06/30/2023)  Tobacco Use: High Risk (06/29/2023)    Readmission Risk Interventions    06/22/2023    4:22 PM 05/04/2023    1:50 PM  Readmission Risk Prevention Plan  Transportation Screening Complete Complete  PCP or Specialist Appt within 5-7 Days  Complete  PCP or Specialist Appt within 3-5 Days Complete   Home Care Screening  Complete  Medication Review (RN CM)  Referral to Pharmacy  HRI or Home Care Consult Complete   Social Work Consult for Recovery Care Planning/Counseling Complete   Palliative Care Screening Complete   Medication Review Oceanographer) Complete

## 2023-07-09 ENCOUNTER — Inpatient Hospital Stay (HOSPITAL_COMMUNITY): Payer: 59

## 2023-07-09 LAB — COMPREHENSIVE METABOLIC PANEL
ALT: 11 U/L (ref 0–44)
AST: 15 U/L (ref 15–41)
Albumin: 1.7 g/dL — ABNORMAL LOW (ref 3.5–5.0)
Alkaline Phosphatase: 57 U/L (ref 38–126)
Anion gap: 6 (ref 5–15)
BUN: 20 mg/dL (ref 8–23)
CO2: 27 mmol/L (ref 22–32)
Calcium: 8.1 mg/dL — ABNORMAL LOW (ref 8.9–10.3)
Chloride: 106 mmol/L (ref 98–111)
Creatinine, Ser: 0.56 mg/dL — ABNORMAL LOW (ref 0.61–1.24)
GFR, Estimated: >60 mL/min (ref >60)
Glucose, Bld: 174 mg/dL — ABNORMAL HIGH (ref 70–99)
Potassium: 3.6 mmol/L (ref 3.5–5.1)
Sodium: 139 mmol/L (ref 135–145)
Total Bilirubin: 0.4 mg/dL (ref 0.3–1.2)
Total Protein: 5.6 g/dL — ABNORMAL LOW (ref 6.5–8.1)

## 2023-07-09 LAB — CBC
HCT: 29.4 % — ABNORMAL LOW (ref 39.0–52.0)
Hemoglobin: 9.1 g/dL — ABNORMAL LOW (ref 13.0–17.0)
MCH: 27.7 pg (ref 26.0–34.0)
MCHC: 31 g/dL (ref 30.0–36.0)
MCV: 89.4 fL (ref 80.0–100.0)
Platelets: 527 10*3/uL — ABNORMAL HIGH (ref 150–400)
RBC: 3.29 MIL/uL — ABNORMAL LOW (ref 4.22–5.81)
RDW: 15.4 % (ref 11.5–15.5)
WBC: 3.2 10*3/uL — ABNORMAL LOW (ref 4.0–10.5)
nRBC: 0 % (ref 0.0–0.2)

## 2023-07-09 MED ORDER — LACTATED RINGERS IV SOLN: INTRAVENOUS | Status: AC

## 2023-07-09 NOTE — Progress Notes (Signed)
NAME:  [name], MRN:  [id number], DOB:  1956-09-18, LOS: 9 ADMISSION DATE:  06/29/2023, CONSULTATION DATE:  07/06/2023 REFERRING MD:  Dr. Toula Moos - IMTS, CHIEF COMPLAINT:  Pleural effusion    History of Present Illness:  [name] is a 68 y.o. male with a PMH significant for HTN, HLD, diabetes and failure to thrive who presented to the ED 06/29/2023 for request of placement assistance. During admission patient has been diagnosed with aspiration PNA with osteomyelitis of right greater trochanter and severe deconditioning.   AM 7/15 patient was complaining of worsening dyspnea with elevated D-dimer therefore CTA chest was obtained that again showed unchanged loculated left pleural effusion. PCCM consulted for assistance in management and possible need for chest tube.  Pertinent  Medical History   HTN, HLD, diabetes and failure to thrive   Significant Hospital Events: Including procedures, antibiotic start and stop dates in addition to other pertinent events   7/8 presented for assistance in placement  7/15 PCCM consulted, CTA chest repeated with  moderate left loculated pleural effusion  07/06/2023 left chest tube placed 07/09/2023 left chest tube clamped for attempt at repeat cytology.  Interim History / Subjective:  Chest tube has been clamped RN has been given instructions on obtaining a specimen to send for cytology on 07/09/2023  Objective   Blood pressure (!) 143/78, pulse 94, temperature 97.7 F (36.5 C), temperature source Oral, resp. rate 17, height 5\' 10"  (1.778 m), weight 82.6 kg, SpO2 99%.        Intake/Output Summary (Last 24 hours) at 07/09/2023 1105 Last data filed at 07/09/2023 0545 Gross per 24 hour  Intake --  Output 200 ml  Net -200 ml   Filed Weights   06/29/23 2155 06/30/23 0502  Weight: 83.5 kg 82.6 kg    Examination: I ill-appearing 67 year old male No JVD or lymphadenopathy is appreciated Decreased breath sounds on the left left chest tube with serous  drainage 200 cc over the last 24 hours Heart sounds are distant Abdomen soft nontender Remedies with edema  Resolved Hospital Problem list     Assessment & Plan:  Moderate loculated left pleural effusion > concern for parapneumonic Acute respiratory failure with hypoxia Likely aspiration pneumonia - CTA chest repeated with  moderate left loculated pleural effusion  P: Left chest tube placed without problem 07/06/2023 700 cc of fluid obtained at initial placement of chest tube 400 cc on 07/07/2023 200 cc on 07/08/2023 200 cc on 07/09/2023 Left chest tube clamped with RN at bedside with instructions on obtaining 20 cc of pleural fluid for a repeat cytology and then unclamping the tube Continue chest tube until drainage is less than 100 cc a day He does complain of burning with flushing of chest tube chest x-ray reviewed and chest tube appears to be in appropriate position.     Best Practice (right click and "Reselect all SmartList Selections" daily)  Per primary   Labs   CBC: Recent Labs  Lab 07/03/23 0219 07/06/23 0352  WBC 8.9 11.1*  NEUTROABS  --  8.7*  HGB 8.3* 10.0*  HCT 27.0* 32.3*  MCV 90.6 89.2  PLT 484* 620*    Basic Metabolic Panel: Recent Labs  Lab 07/03/23 0219 07/06/23 0352  NA 136 136  K 3.4* 3.5  CL 106 101  CO2 26 23  GLUCOSE 148* 152*  BUN 16 12  CREATININE 0.65 0.74  CALCIUM 7.9* 8.3*   GFR: Estimated Creatinine Clearance: 95.1 mL/min (by C-G formula based  on SCr of 0.74 mg/dL). Recent Labs  Lab 07/03/23 0219 07/06/23 0352  WBC 8.9 11.1*    Liver Function Tests: Recent Labs  Lab 07/06/23 0352 07/07/23 0311  AST 23  --   ALT 16  --   ALKPHOS 62  --   BILITOT 0.7  --   PROT 6.5  --   ALBUMIN 1.9* 1.8*   No results for input(s): "LIPASE", "AMYLASE" in the last 168 hours. No results for input(s): "AMMONIA" in the last 168 hours.  ABG    Component Value Date/Time   PHART 7.426 07/17/2020 0648   PCO2ART 29.1 (L) 07/17/2020  0648   PO2ART 79.0 (L) 07/17/2020 0648   HCO3 28.3 (H) 05/05/2023 1751   TCO2 17 (L) 06/18/2023 2027   ACIDBASEDEF 2.0 09/01/2022 2218   O2SAT 66 05/05/2023 1751     Coagulation Profile: No results for input(s): "INR", "PROTIME" in the last 168 hours.  Cardiac Enzymes: Recent Labs  Lab 07/06/23 0352  CKTOTAL 38*    HbA1C: Hgb A1c MFr Bld  Date/Time Value Ref Range Status  03/27/2023 08:46 PM 5.7 (H) 4.8 - 5.6 % Final    Comment:    (NOTE) Pre diabetes:          5.7%-6.4%  Diabetes:              >6.4%  Glycemic control for   <7.0% adults with diabetes   07/15/2020 03:00 PM 6.9 (H) 4.8 - 5.6 % Final    Comment:    (NOTE) Pre diabetes:          5.7%-6.4%  Diabetes:              >6.4%  Glycemic control for   <7.0% adults with diabetes     CBG: Recent Labs  Lab 07/06/23 0409  GLUCAP 154Brett Canales Denham Mose ACNP Acute Care Nurse Practitioner [facility] Pulmonary/Critical Care Please consult Amion 07/09/2023, 11:05 AM

## 2023-07-09 NOTE — Progress Notes (Addendum)
HD#9 SUBJECTIVE:  Patient Summary: [name] is a 67 y.o. male with a pertinent PMH of severe deconditioning and malnutrition who is being treated for osteomyelitis of the right greater trochanter and aspiration pneumonia.  Overnight Events: None  Interim History: He is feeling okay this morning, some shortness of breath but improved.  He says his left side where the chest tube is hurts. No other acute concerns.   OBJECTIVE:  Vital Signs: Vitals:   07/08/23 1600 07/08/23 2022 07/08/23 2300 07/09/23 0854  BP: 115/63 130/76  (!) 143/78  Pulse: 96  99 94  Resp: 17 18  17   Temp: (!) 97.5 F (36.4 C) 98.2 F (36.8 C)  97.7 F (36.5 C)  TempSrc: Oral Oral  Oral  SpO2: 97% 97%  99%  Weight:      Height:       Supplemental O2: Nasal Cannula SpO2: 99 % O2 Flow Rate (L/min): 3 L/min  Filed Weights   06/29/23 2155 06/30/23 0502  Weight: 83.5 kg 82.6 kg     Intake/Output Summary (Last 24 hours) at 07/09/2023 1325 Last data filed at 07/09/2023 0545 Gross per 24 hour  Intake --  Output 200 ml  Net -200 ml   Net IO Since Admission: -4,685 mL [07/09/23 1325]  Physical Exam: General: laying in bed, mildly uncomfortable Pulm: Normal rate and effort of breathing, some slight rales on the left base but improved from prior Cardio: tachycardic, normal rhythm  Patient Lines/Drains/Airways Status     Active Line/Drains/Airways     Name Placement date Placement time Site Days   PICC Single Lumen 06/24/23 Left Basilic 44 cm 0 cm 06/24/23  0272  Basilic  12   External Urinary Catheter 06/30/23  0430  --  6   Pressure Injury 04/30/23 Hip Anterior;Left;Proximal Stage 2 -  Partial thickness loss of dermis presenting as a shallow open injury with a red, pink wound bed without slough. close to R hip 04/30/23  2200  -- 67   Pressure Injury 04/30/23 Hip Anterior;Proximal;Right Stage 2 -  Partial thickness loss of dermis presenting as a shallow open injury with a red, pink wound bed  without slough. 1/2 cm by 0.8cm by 0.2 cm 04/30/23  2200  -- 67   Pressure Injury 04/30/23 Heel Right;Left Stage 1 -  Intact skin with non-blanchable redness of a localized area usually over a bony prominence. soft pressure on heels bilaterally foams applied 04/30/23  2200  -- 68   Pressure Injury 06/19/23 Buttocks Right;Upper Unstageable - Full thickness tissue loss in which the base of the injury is covered by slough (yellow, tan, gray, green or brown) and/or eschar (tan, brown or black) in the wound bed. 06/19/23  1408  -- 17   Pressure Injury 06/19/23 Buttocks Right Unstageable - Full thickness tissue loss in which the base of the injury is covered by slough (yellow, tan, gray, green or brown) and/or eschar (tan, brown or black) in the wound bed. 06/19/23  1408  -- 17   Pressure Injury 06/19/23 Buttocks Right Stage 2 -  Partial thickness loss of dermis presenting as a shallow open injury with a red, pink wound bed without slough. 06/19/23  1408  -- 17   Pressure Injury 06/19/23 Hip Left;Lateral Stage 2 -  Partial thickness loss of dermis presenting as a shallow open injury with a red, pink wound bed without slough. 06/19/23  1408  -- 17   Pressure Injury 06/19/23 Lumbar Lateral;Right Stage 2 -  Partial thickness loss of dermis presenting as a shallow open injury with a red, pink wound bed without slough. 06/19/23  1408  -- 17   Pressure Injury 06/19/23 Heel Left;Lateral Stage 4 - Full thickness tissue loss with exposed bone, tendon or muscle. 06/19/23  1408  -- 17   Pressure Injury 06/19/23 Foot Right;Lateral Unstageable - Full thickness tissue loss in which the base of the injury is covered by slough (yellow, tan, gray, green or brown) and/or eschar (tan, brown or black) in the wound bed. 06/19/23  1408  -- 17   Pressure Injury 06/19/23 Heel Right;Lateral Unstageable - Full thickness tissue loss in which the base of the injury is covered by slough (yellow, tan, gray, green or brown) and/or eschar (tan,  brown or black) in the wound bed. 06/19/23  1408  -- 17   Pressure Injury 06/19/23 Heel Left Unstageable - Full thickness tissue loss in which the base of the injury is covered by slough (yellow, tan, gray, green or brown) and/or eschar (tan, brown or black) in the wound bed. 06/19/23  1408  -- 17   Wound / Incision (Open or Dehisced) 08/02/20 Sacrum Medial 08/02/20  1458  Sacrum  1068   Wound / Incision (Open or Dehisced) 08/03/20 Hip Left 08/03/20  1513  Hip  1067            Pertinent Labs:    Latest Ref Rng & Units 07/06/2023    3:52 AM 07/03/2023    2:19 AM 06/30/2023   12:28 AM  CBC  WBC 4.0 - 10.5 K/uL 11.1  8.9  10.2   Hemoglobin 13.0 - 17.0 g/dL 74.2  8.3  9.2   Hematocrit 39.0 - 52.0 % 32.3  27.0  30.1   Platelets 150 - 400 K/uL 620  484  426        Latest Ref Rng & Units 07/06/2023    3:52 AM 07/03/2023    2:19 AM 06/30/2023   12:28 AM  CMP  Glucose 70 - 99 mg/dL 595  638  756   BUN 8 - 23 mg/dL 12  16  14    Creatinine 0.61 - 1.24 mg/dL 4.33  2.95  1.88   Sodium 135 - 145 mmol/L 136  136  135   Potassium 3.5 - 5.1 mmol/L 3.5  3.4  3.8   Chloride 98 - 111 mmol/L 101  106  103   CO2 22 - 32 mmol/L 23  26  24    Calcium 8.9 - 10.3 mg/dL 8.3  7.9  8.3   Total Protein 6.5 - 8.1 g/dL 6.5     Total Bilirubin 0.3 - 1.2 mg/dL 0.7     Alkaline Phos 38 - 126 U/L 62     AST 15 - 41 U/L 23     ALT 0 - 44 U/L 16       Pleural fluid culture: no growth after 3 days  Pertinent Imaging:  Chest x-ray: decreasing left effusion; increasing interstitial pattern bilaterally in upper lobes, L > R  ASSESSMENT/PLAN:  Assessment: Principal Problem:   Infected decubitus ulcer Active Problems:   Diabetes mellitus (HCC)   Protein-calorie malnutrition, severe   Osteomyelitis of right femur (HCC)   Goals of care, counseling/discussion   Osteomyelitis (HCC)   Aspiration pneumonia of left lower lobe (HCC)   Parapneumonic effusion   Need for management of chest tube   Loculated pleural  effusion   Plan: Aspiration Pneumonia Turned on O2 to 2  L this morning and saturations remained 98-99%.  Afebrile.  Clinically, respiratory status seems to be improving.  Will continue to wean oxygen as tolerated. Chest tube drained 200cc serous fluid in the last 24 hrs.  -Continue Flagyl (day 7/7); continue ceftriaxone -Wean O2 as tolerated -PCCM following; will remove tube once drainage decreases  Osteomyelitis of right greater trochanter No acute changes. Continue management/treatment of his osteomyelitis. -IV daptomycin and ceftriaxone until 8/9 -Wound care and frequent offloading -Tylenol 1000 mg every 8 hours; oxycodone 10 mg every 3 hours for severe pain -CK, CBC with differential, CMP, ESR/CRP next Monday  Severe deconditioning and weakness The nutrition team evaluated the patient for malnutrition and recommended CorTrak placement to ensure adequate nutrition for his wound healing. The patient is declining the tube at this time. I shared with him our concern that unless he significantly increases his nutrition intake, he will not heal properly, and he voiced understanding. We will continue to touch base with him about our recommendation for a CorTrak. I have requested that the nursing team document his oral intake so we have a better understanding of his nutrition intake.  -Monitor oral intake -Nutritional supplementation -PT mobility -PT wound care daily, asked to see him later in the day  Anxiety No new anxiety reported or changes in management. -Buspirone 5 mg 3 times daily -Hydroxyzine 25 mg PRN  Best Practice: Diet: Regular diet IVF: None VTE: enoxaparin (LOVENOX) injection 40 mg Start: 07/05/23 2200 Code: DNR AB: IV Daptomycin and Ceftriaxone until 8/9; Flagyl Therapy Recs: SNF Family Contact: Elissa Hefty, to be notified. DISPO: Anticipated discharge to Skilled nursing facility pending placement and medical stability.  Signature: Annett Fabian, MD  Internal  Medicine Resident, PGY-1 [facility]  Pager: [phone] 1:25 PM, 07/09/2023   Please contact the on call pager after 5 pm and on weekends at [phone].

## 2023-07-09 NOTE — Progress Notes (Signed)
Lunch- Patient ate 25% of one burger. He also did take in 100% of the Juven and Ensure with medications.

## 2023-07-09 NOTE — Plan of Care (Signed)
  Problem: Education: Goal: Knowledge of General Education information will improve Description: Including pain rating scale, medication(s)/side effects and non-pharmacologic comfort measures Outcome: Not Progressing   Problem: Health Behavior/Discharge Planning: Goal: Ability to manage health-related needs will improve Outcome: Not Progressing   Problem: Elimination: Goal: Will not experience complications related to urinary retention Outcome: Progressing

## 2023-07-09 NOTE — Progress Notes (Addendum)
Chest tube placed to wall suction at this time. Instructed to this by Cloyd Stagers PA by text message.

## 2023-07-09 NOTE — Progress Notes (Signed)
PT Cancellation Note  Patient Details Name: [name] MRN: [id number] DOB: September 02, 1956   Cancelled Treatment:    Reason Eval/Treat Not Completed: Patient at procedure or test/unavailable this afternoon, having wound care done by RN upon my arrival. Will attempt as time/schedule allows for mobility progression.   Vickki Muff, PT, DPT   Acute Rehabilitation Department Office [phone] Secure Chat Communication Preferred   Ronnie Derby 07/09/2023, 2:33 PM

## 2023-07-09 NOTE — Progress Notes (Signed)
Breakfast intake was 0% of food or drink on the tray.  He did take Ensure and Juven ordered with medication.

## 2023-07-09 NOTE — Progress Notes (Signed)
Pt hollering and using profanity for staff to get out of his room. Refused 0600 meds and dressing change.

## 2023-07-10 ENCOUNTER — Inpatient Hospital Stay (HOSPITAL_COMMUNITY): Payer: 59

## 2023-07-10 LAB — CULTURE, BODY FLUID W GRAM STAIN -BOTTLE: Culture: NO GROWTH

## 2023-07-10 LAB — CYTOLOGY - NON PAP

## 2023-07-10 MED ORDER — METRONIDAZOLE 500 MG PO TABS
500.0000 mg | ORAL_TABLET | Freq: Three times a day (TID) | ORAL | Status: DC
  Administered 2023-07-10 – 2023-07-16 (×17): 500 mg via ORAL
  Filled 2023-07-10 (×17): qty 1

## 2023-07-10 MED ORDER — CHLORHEXIDINE GLUCONATE CLOTH 2 % EX PADS
6.0000 | MEDICATED_PAD | Freq: Once | CUTANEOUS | Status: AC
  Administered 2023-07-10: 6 via TOPICAL

## 2023-07-10 NOTE — Significant Event (Signed)
Rapid Response Event Note   Reason for Call :  YELLOW MEWS-HR-120s  Asked to see pt as a "second set of eyes" for tachycardia. Per RN, tachycardia is not new and other VS are stable.  Initial Focused Assessment:  Pt lying in bed with eyes open. He is very irritable and wants to be left alone. He is c/o 10/10 L sided chest pain where his L chest tube is. Pt turned to R side off of CT site and CT assessed(WNL) and dressing changed. Lungs clear/diminished. Skin: extremities cool to touch but core is hot.   T-101.2(Ax), HR-153, BP-172/85, RR-20, SpO2-99% on 2L [place].   Interventions:  Tylenol PO BC x 2 Plan of Care:  Pt has been tachycardic. Fever and pain may be contributing factors. Allow tylenol time to work. Oxy IR available for pain. Please call RRT if further assistance needed.   Event Summary:   MD Notified: Dr. August Saucer notified by bedside RN Call [phone] Arrival [phone] End Time:2340  Terrilyn Saver, RN

## 2023-07-10 NOTE — Progress Notes (Signed)
Pt refusing hygiene care and wound care. Allowed frequent repositioning overnight and states they just want to be left alone right now.

## 2023-07-10 NOTE — Progress Notes (Addendum)
   NAME:  [name], MRN:  [id number], DOB:  08/30/1956, LOS: 10 ADMISSION DATE:  06/29/2023, CONSULTATION DATE:  07/06/2023 REFERRING MD:  Dr. Toula Moos - IMTS, CHIEF COMPLAINT:  Pleural effusion    History of Present Illness:  [name] is a 67 y.o. male with a PMH significant for HTN, HLD, diabetes and failure to thrive who presented to the ED 06/29/2023 for request of placement assistance. During admission patient has been diagnosed with aspiration PNA with osteomyelitis of right greater trochanter and severe deconditioning.   AM 7/15 patient was complaining of worsening dyspnea with elevated D-dimer therefore CTA chest was obtained that again showed unchanged loculated left pleural effusion. PCCM consulted for assistance in management and possible need for chest tube.  Pertinent  Medical History   HTN, HLD, diabetes and failure to thrive   Significant Hospital Events: Including procedures, antibiotic start and stop dates in addition to other pertinent events   7/8 presented for assistance in placement  7/15 PCCM consulted, CTA chest repeated with  moderate left loculated pleural effusion  07/06/2023 left chest tube placed 07/09/2023 left chest tube clamped and repeat cytology sent.  Interim History / Subjective:   Repeat cytology sent yesterday and is still pending.   Objective   Blood pressure (!) 142/86, pulse (!) 110, temperature 98.2 F (36.8 C), temperature source Oral, resp. rate 16, height 5\' 10"  (1.778 m), weight 82.6 kg, SpO2 95%.        Intake/Output Summary (Last 24 hours) at 07/10/2023 1234 Last data filed at 07/10/2023 0540 Gross per 24 hour  Intake 1298.02 ml  Output 350 ml  Net 948.02 ml   Filed Weights   06/29/23 2155 06/30/23 0502  Weight: 83.5 kg 82.6 kg    Examination: Gen:      No acute distress, chronically ill apprearing HEENT:  EOMI, sclera anicteric Neck:     No masses; no thyromegaly Lungs:    Clear to auscultation bilaterally; normal respiratory  effort CV:         Regular rate and rhythm; no murmurs Abd:      + bowel sounds; soft, non-tender; no palpable masses, no distension Ext:    No edema; adequate peripheral perfusion Skin:      Warm and dry; no rash Neuro: Responds yes or no to questions.  CXR 7/19 reviewed with patchy airspace disease, trace left effusion  Resolved Hospital Problem list     Assessment & Plan:  Moderate loculated left pleural effusion > concern for parapneumonic Acute respiratory failure with hypoxia Likely aspiration pneumonia - CTA chest repeated with  moderate left loculated pleural effusion  P: Left chest tube placed without problem 07/06/2023 700 cc of fluid obtained at initial placement of chest tube 400 cc on 07/07/2023 200 cc on 07/08/2023 200 cc on 07/09/2023 350 cc on 07/10/2023  Continue chest tube until drainage is less than 100 cc a day Follow cytology results. We will check back on Monday 7/22. Please call over the weekend with any questions.  Best Practice (right click and "Reselect all SmartList Selections" daily)  Per primary   Signature:   Chilton Greathouse MD [facility] See Amion for pager  If no response to pager , please call [phone] until 7pm After 7:00 pm call Elink  [phone] 07/10/2023, 12:43 PM

## 2023-07-10 NOTE — Progress Notes (Signed)
Nutrition Brief Note  RD received a consult to initiate a calorie count. Envelope hung on door. RN informed. RD will follow-up with results on Monday.    Kirby Crigler RD, LDN Clinical Dietitian See Loretha Stapler for contact information.

## 2023-07-10 NOTE — Progress Notes (Signed)
Chest tube reservoir changed.

## 2023-07-10 NOTE — Progress Notes (Addendum)
HD#10 SUBJECTIVE:  Patient Summary: [name] is a 67 y.o. male with a pertinent PMH of severe deconditioning and malnutrition who is being treated for osteomyelitis of the right greater trochanter and aspiration pneumonia.  Overnight Events: None  Interim History: He says his breathing is better this morning. He notes some gluteal discomfort due to positioning, so we repositioned him. Otherwise he has no new concerns and says he is feeling better than yesterday.   OBJECTIVE:  Vital Signs: Vitals:   07/09/23 1954 07/10/23 0700 07/10/23 0835 07/10/23 0839  BP: (!) 150/63 138/65 (!) 142/86   Pulse: (!) 101 98 (!) 130 (!) 110  Resp: 17 17 16    Temp: 98.4 F (36.9 C) 97.7 F (36.5 C) 98.2 F (36.8 C)   TempSrc: Oral Oral Oral   SpO2: 96% 99% 95%   Weight:      Height:       Supplemental O2: Nasal Cannula SpO2: 95 % O2 Flow Rate (L/min): 2 L/min  Filed Weights   06/29/23 2155 06/30/23 0502  Weight: 83.5 kg 82.6 kg     Intake/Output Summary (Last 24 hours) at 07/10/2023 1258 Last data filed at 07/10/2023 0540 Gross per 24 hour  Intake 1298.02 ml  Output 350 ml  Net 948.02 ml   Net IO Since Admission: -3,436.98 mL [07/10/23 1258]  Physical Exam: General: laying in bed on left side Pulm: Normal rate of breathing, no accessory muscles, no lung sounds noted this morning but exam limited by patient positioning Cardio: mildly tachycardic, normal rhythm  Patient Lines/Drains/Airways Status     Active Line/Drains/Airways     Name Placement date Placement time Site Days   PICC Single Lumen 06/24/23 Left Basilic 44 cm 0 cm 06/24/23  9629  Basilic  12   External Urinary Catheter 06/30/23  0430  --  6   Pressure Injury 04/30/23 Hip Anterior;Left;Proximal Stage 2 -  Partial thickness loss of dermis presenting as a shallow open injury with a red, pink wound bed without slough. close to R hip 04/30/23  2200  -- 67   Pressure Injury 04/30/23 Hip Anterior;Proximal;Right Stage  2 -  Partial thickness loss of dermis presenting as a shallow open injury with a red, pink wound bed without slough. 1/2 cm by 0.8cm by 0.2 cm 04/30/23  2200  -- 67   Pressure Injury 04/30/23 Heel Right;Left Stage 1 -  Intact skin with non-blanchable redness of a localized area usually over a bony prominence. soft pressure on heels bilaterally foams applied 04/30/23  2200  -- 67   Pressure Injury 06/19/23 Buttocks Right;Upper Unstageable - Full thickness tissue loss in which the base of the injury is covered by slough (yellow, tan, gray, green or brown) and/or eschar (tan, brown or black) in the wound bed. 06/19/23  1408  -- 17   Pressure Injury 06/19/23 Buttocks Right Unstageable - Full thickness tissue loss in which the base of the injury is covered by slough (yellow, tan, gray, green or brown) and/or eschar (tan, brown or black) in the wound bed. 06/19/23  1408  -- 17   Pressure Injury 06/19/23 Buttocks Right Stage 2 -  Partial thickness loss of dermis presenting as a shallow open injury with a red, pink wound bed without slough. 06/19/23  1408  -- 17   Pressure Injury 06/19/23 Hip Left;Lateral Stage 2 -  Partial thickness loss of dermis presenting as a shallow open injury with a red, pink wound bed without slough. 06/19/23  1408  -- 17   Pressure Injury 06/19/23 Lumbar Lateral;Right Stage 2 -  Partial thickness loss of dermis presenting as a shallow open injury with a red, pink wound bed without slough. 06/19/23  1408  -- 17   Pressure Injury 06/19/23 Heel Left;Lateral Stage 4 - Full thickness tissue loss with exposed bone, tendon or muscle. 06/19/23  1408  -- 17   Pressure Injury 06/19/23 Foot Right;Lateral Unstageable - Full thickness tissue loss in which the base of the injury is covered by slough (yellow, tan, gray, green or brown) and/or eschar (tan, brown or black) in the wound bed. 06/19/23  1408  -- 17   Pressure Injury 06/19/23 Heel Right;Lateral Unstageable - Full thickness tissue loss in  which the base of the injury is covered by slough (yellow, tan, gray, green or brown) and/or eschar (tan, brown or black) in the wound bed. 06/19/23  1408  -- 17   Pressure Injury 06/19/23 Heel Left Unstageable - Full thickness tissue loss in which the base of the injury is covered by slough (yellow, tan, gray, green or brown) and/or eschar (tan, brown or black) in the wound bed. 06/19/23  1408  -- 17   Wound / Incision (Open or Dehisced) 08/02/20 Sacrum Medial 08/02/20  1458  Sacrum  1068   Wound / Incision (Open or Dehisced) 08/03/20 Hip Left 08/03/20  1513  Hip  1067            Pertinent Labs:    Latest Ref Rng & Units 07/09/2023    4:31 PM 07/06/2023    3:52 AM 07/03/2023    2:19 AM  CBC  WBC 4.0 - 10.5 K/uL 3.2  11.1  8.9   Hemoglobin 13.0 - 17.0 g/dL 9.1  21.3  8.3   Hematocrit 39.0 - 52.0 % 29.4  32.3  27.0   Platelets 150 - 400 K/uL 527  620  484        Latest Ref Rng & Units 07/09/2023    4:31 PM 07/06/2023    3:52 AM 07/03/2023    2:19 AM  CMP  Glucose 70 - 99 mg/dL 086  578  469   BUN 8 - 23 mg/dL 20  12  16    Creatinine 0.61 - 1.24 mg/dL 6.29  5.28  4.13   Sodium 135 - 145 mmol/L 139  136  136   Potassium 3.5 - 5.1 mmol/L 3.6  3.5  3.4   Chloride 98 - 111 mmol/L 106  101  106   CO2 22 - 32 mmol/L 27  23  26    Calcium 8.9 - 10.3 mg/dL 8.1  8.3  7.9   Total Protein 6.5 - 8.1 g/dL 5.6  6.5    Total Bilirubin 0.3 - 1.2 mg/dL 0.4  0.7    Alkaline Phos 38 - 126 U/L 57  62    AST 15 - 41 U/L 15  23    ALT 0 - 44 U/L 11  16      Pleural fluid culture: no growth after 3 days Cytology pending  Pertinent Imaging:  Chest x-ray: coarsened interstitial markings with interval development of patchy airspace opacities along the left mid lung  ASSESSMENT/PLAN:  Assessment: Principal Problem:   Infected decubitus ulcer Active Problems:   Diabetes mellitus (HCC)   Protein-calorie malnutrition, severe   Osteomyelitis of right femur (HCC)   Goals of care,  counseling/discussion   Osteomyelitis (HCC)   Aspiration pneumonia of left lower lobe (HCC)  Parapneumonic effusion   Need for management of chest tube   Loculated pleural effusion  Plan: Aspiration Pneumonia He says his breathing is better today, despite oxygen nasal cannula not being in his nose. He remains afebrile and his respiratory status seems to be improving. New interval airspace opacities noted on CXR, which we will monitor. We will continue to attempt to wean his O2. Chest tube drained 350cc on 7/19.  -Continue ceftriaxone -Wean O2 as tolerated -Cytology of pleural fluid pending -PCCM following; plan is to remove tube once drainage ceases  Severe deconditioning and weakness We are doing a calorie count to better understand his nutrition intake. He did not eat much yesterday- 0% of breakfast, 25% of lunch. We spoke with him again about the importance of nutrition and the option of placing a CorTrak, but he does not want one at this time.  -Calorie Count -Nutrition supplementation -PT mobility -PT wound care daily  Osteomyelitis of right greater trochanter No acute changes. Continue management/treatment of his osteomyelitis. -IV daptomycin and ceftriaxone until 8/9 -Wound care and frequent offloading; spoke with nursing about avoiding early morning interruptions per patient request -Tylenol 1000 mg every 8 hours; oxycodone 10 mg every 3 hours for severe pain -CK, CBC with differential, CMP, ESR/CRP next Monday  Best Practice: Diet: Regular diet IVF: None VTE: enoxaparin (LOVENOX) injection 40 mg Start: 07/05/23 2200 Code: DNR AB: IV Daptomycin and Ceftriaxone until 8/9; Flagyl Therapy Recs: SNF DISPO: Anticipated discharge to Skilled nursing facility pending placement and medical stability.  Signature: Annett Fabian, MD  Internal Medicine Resident, PGY-1 [facility]  Pager: [phone] 12:58 PM, 07/10/2023   Please contact the on  call pager after 5 pm and on weekends at [phone].

## 2023-07-10 NOTE — Progress Notes (Signed)
**Note Walter-Identified via Obfuscation** Physical Therapy Treatment Patient Details Name: DEMPSY Kane MRN: [id number] DOB: 09-09-1956 Today's Date: 07/10/2023   History of Present Illness 67 yo male admitted on 7/9 with decubitus ulcer; pt and brother unable to manage at home; recent admission, dc'd 7/8, returned on 7/9; Hydrotherapy initiated; 7/15 difficulty breathing with pleural effusion, L chest tube placed; with PMH of hypertension, gastric ulcers, severe deconditioning, left femoral fracture s/p IMN, previously admitted and treated for right hip osteomyelitis. Discharged with home health    PT Comments  Pt today agreeable to bed level exercises. Pt reports being in too much pain to mobilize. No change in DC/DME recs at this time. PT will continue to follow.     Assistance Recommended at Discharge Frequent or constant Supervision/Assistance  If plan is discharge home, recommend the following:  Can travel by private vehicle    Assist for transportation;Help with stairs or ramp for entrance;Other (comment);Two people to help with walking and/or transfers;A lot of help with walking and/or transfers   No  Equipment Recommendations  Other (comment) (Per accepting facility)    Recommendations for Other Services       Precautions / Restrictions Precautions Precautions: Fall Restrictions Weight Bearing Restrictions: No     Mobility  Bed Mobility                    Transfers                        Ambulation/Gait                   Stairs             Wheelchair Mobility     Tilt Bed    Modified Rankin (Stroke Patients Only)       Balance                                            Cognition Arousal/Alertness: Awake/alert Behavior During Therapy: WFL for tasks assessed/performed Overall Cognitive Status: Within Functional Limits for tasks assessed                                 General Comments: Able to provide background  information and level of function prior to femur fracture and how it has changed.        Exercises General Exercises - Upper Extremity Elbow Flexion: AROM, Both, 10 reps Elbow Extension: AROM, Both, 10 reps, Supine, Other (comment) (Resistance provided) General Exercises - Lower Extremity Ankle Circles/Pumps: AROM, Both, 15 reps Other Exercises Other Exercises: Boxing using pillow as punching bag 3x20 punches BUE    General Comments        Pertinent Vitals/Pain Pain Assessment Pain Assessment: Faces Faces Pain Scale: Hurts even more Pain Location: Buttocks, BLE during any movement Pain Descriptors / Indicators: Constant, Grimacing, Discomfort, Spasm Pain Intervention(s): Monitored during session, Limited activity within patient's tolerance    Home Living                          Prior Function            PT Goals (current goals can now be found in the care plan section) Progress towards PT goals: Progressing toward goals    Frequency  Min 2X/week      PT Plan Current plan remains appropriate    Co-evaluation              AM-PAC PT "6 Clicks" Mobility   Outcome Measure  Help needed turning from your back to your side while in a flat bed without using bedrails?: Total Help needed moving from lying on your back to sitting on the side of a flat bed without using bedrails?: Total Help needed moving to and from a bed to a chair (including a wheelchair)?: Total Help needed standing up from a chair using your arms (e.g., wheelchair or bedside chair)?: Total Help needed to walk in hospital room?: Total Help needed climbing 3-5 steps with a railing? : Total 6 Click Score: 6    End of Session Equipment Utilized During Treatment: Oxygen Activity Tolerance: Patient tolerated treatment well;Patient limited by pain Patient left: in bed;with call bell/phone within reach Nurse Communication: Mobility status;Need for lift equipment PT Visit Diagnosis:  Muscle weakness (generalized) (M62.81);History of falling (Z91.81);Difficulty in walking, not elsewhere classified (R26.2)     Time: 4098-1191 PT Time Calculation (min) (ACUTE ONLY): 15 min  Charges:    $Therapeutic Exercise: 8-22 mins PT General Charges $$ ACUTE PT VISIT: 1 Visit                     Shela Nevin, PT, DPT Acute Rehab Services [phone]    [name] 07/10/2023, 4:53 PM

## 2023-07-10 NOTE — Progress Notes (Signed)
Patient was irritable after the debridement. He stated, " he is too tired to just leave him alone." We changed him then wound care came in and he said he didn't expect that.  Patient refused 4:00 meds. Was able to hang IV medications

## 2023-07-10 NOTE — Progress Notes (Signed)
Physical Therapy Wound Treatment Patient Details  Name: [name] MRN: [id number] Date of Birth: [birth year]  Today's Date: 07/10/2023 Time: 4098-1191 Time Calculation (min): 40 min  Subjective  Subjective Assessment Subjective: Pt pleasant and agreeable to wound therapy session Patient and Family Stated Goals: For wound to heal Date of Onset:  (Present on admission) Prior Treatments: bedside debridement and wound therapy prior admission  Pain Score:  Premedicated for pain and pt tolerated without complaints of pain.  Wound Assessment  Pressure Injury 06/19/23 Buttocks Right;Upper Unstageable - Full thickness tissue loss in which the base of the injury is covered by slough (yellow, tan, gray, green or brown) and/or eschar (tan, brown or black) in the wound bed. (Active)  Dressing Type Foam - Lift dressing to assess site every shift;Gauze (Comment);Santyl;Barrier Film (skin prep);Moist to moist 07/10/23 1358  Dressing Clean, Dry, Intact 07/10/23 1358  Dressing Change Frequency Twice a day 07/10/23 1358  State of Healing Non-healing 07/10/23 1358  Site / Wound Assessment Yellow;Pink 07/10/23 1358  % Wound base Red or Granulating 5% 07/10/23 1358  % Wound base Yellow/Fibrinous Exudate 95% 07/10/23 1358  % Wound base Black/Eschar 0% 07/10/23 1358  % Wound base Other/Granulation Tissue (Comment) 0% 07/10/23 1358  Peri-wound Assessment Intact 07/10/23 1358  Wound Length (cm) 3.7 cm 07/07/23 0939  Wound Width (cm) 2 cm 07/07/23 0939  Wound Depth (cm) 0.1 cm 07/07/23 0939  Wound Surface Area (cm^2) 7.4 cm^2 07/07/23 0939  Wound Volume (cm^3) 0.74 cm^3 07/07/23 0939  Tunneling (cm) 0 07/01/23 1504  Undermining (cm) 0 07/01/23 1504  Margins Unattached edges (unapproximated) 07/10/23 1358  Drainage Amount Moderate 07/10/23 1358  Drainage Description Purulent 07/10/23 1358  Treatment Debridement (Selective);Irrigation;Packing (Saline gauze) 07/10/23 1358     Pressure Injury 06/19/23  Buttocks Right Unstageable - Full thickness tissue loss in which the base of the injury is covered by slough (yellow, tan, gray, green or brown) and/or eschar (tan, brown or black) in the wound bed. (Active)  Dressing Type Foam - Lift dressing to assess site every shift;Gauze (Comment);Santyl;Barrier Film (skin prep);Moist to moist 07/10/23 1358  Dressing Clean, Dry, Intact 07/10/23 1358  Dressing Change Frequency Twice a day 07/10/23 1358  State of Healing Early/partial granulation 07/10/23 1358  Site / Wound Assessment Yellow;Red;Black;Brown 07/10/23 1358  % Wound base Red or Granulating 60% 07/10/23 1358  % Wound base Yellow/Fibrinous Exudate 20% 07/10/23 1358  % Wound base Black/Eschar 20% 07/10/23 1358  % Wound base Other/Granulation Tissue (Comment) 0% 07/10/23 1358  Peri-wound Assessment Pink;Excoriated 07/10/23 1358  Wound Length (cm) 9.2 cm 07/07/23 0939  Wound Width (cm) 6.1 cm 07/07/23 0939  Wound Depth (cm) 1.3 cm 07/07/23 0939  Wound Surface Area (cm^2) 56.12 cm^2 07/07/23 0939  Wound Volume (cm^3) 72.96 cm^3 07/07/23 0939  Tunneling (cm) 0 07/01/23 1504  Undermining (cm) 12-7:00 deepest at 4:00 7.5cm 07/07/23 0939  Margins Unattached edges (unapproximated) 07/10/23 1358  Drainage Amount Copious 07/10/23 1358  Drainage Description Purulent;Odor - foul 07/10/23 1358  Treatment Debridement (Selective);Irrigation;Packing (Saline gauze) 07/10/23 1358      Selective Debridement (non-excisional) Selective Debridement (non-excisional) - Location: right buttocks proximal and distal wound Selective Debridement (non-excisional) - Tools Used: Forceps, Scalpel, Scissors Selective Debridement (non-excisional) - Tissue Removed: yellow and black/brown necrotic tissue at both wound locations    Wound Assessment and Plan  Wound Therapy - Assess/Plan/Recommendations Wound Therapy - Clinical Statement: Proximal and distal wounds now connected and can be considered one wound. Significant  amount of necrotic  tissue in undermining areas which is difficult to access for debridement. This patient will benefit from continued wound therapy for selective removal of unviable tissue, to decrease bioburden, and promote wound bed healing. Wound Therapy - Functional Problem List: immobility Factors Delaying/Impairing Wound Healing: Immobility Hydrotherapy Plan: Debridement, Dressing change, Patient/family education Wound Therapy - Frequency: 2X / week Wound Therapy - Follow Up Recommendations: dressing changes by RN  Wound Therapy Goals- Improve the function of patient's integumentary system by progressing the wound(s) through the phases of wound healing (inflammation - proliferation - remodeling) by: Wound Therapy Goals - Improve the function of patient's integumentary system by progressing the wound(s) through the phases of wound healing by: Decrease Necrotic Tissue to: 20% Decrease Necrotic Tissue - Progress: Progressing toward goal Increase Granulation Tissue to: 80% Increase Granulation Tissue - Progress: Progressing toward goal Goals/treatment plan/discharge plan were made with and agreed upon by patient/family: Yes Time For Goal Achievement: 7 days Wound Therapy - Potential for Goals: Fair  Goals will be updated until maximal potential achieved or discharge criteria met.  Discharge criteria: when goals achieved, discharge from hospital, MD decision/surgical intervention, no progress towards goals, refusal/missing three consecutive treatments without notification or medical reason.  GP     Charges PT Wound Care Charges $Wound Debridement up to 20 cm: < or equal to 20 cm $ Wound Debridement each add'l 20 sqcm: 1 $PT Hydrotherapy Dressing: 1 dressing $PT Hydrotherapy Visit: 1 Visit       [name] 07/10/2023, 2:08 PM  Conni Slipper, PT, DPT Acute Rehabilitation Services Secure Chat Preferred Office: [phone]

## 2023-07-10 NOTE — Plan of Care (Signed)
  Problem: Health Behavior/Discharge Planning: Goal: Ability to manage health-related needs will improve Outcome: Progressing   Problem: Clinical Measurements: Goal: Will remain free from infection Outcome: Progressing Goal: Respiratory complications will improve Outcome: Progressing   Problem: Activity: Goal: Risk for activity intolerance will decrease Outcome: Progressing   Problem: Nutrition: Goal: Adequate nutrition will be maintained Outcome: Progressing   Problem: Coping: Goal: Level of anxiety will decrease Outcome: Progressing   Problem: Pain Managment: Goal: General experience of comfort will improve Outcome: Progressing   Problem: Skin Integrity: Goal: Risk for impaired skin integrity will decrease Outcome: Progressing

## 2023-07-11 DIAGNOSIS — J9601 Acute respiratory failure with hypoxia: Secondary | ICD-10-CM

## 2023-07-11 DIAGNOSIS — F17210 Nicotine dependence, cigarettes, uncomplicated: Secondary | ICD-10-CM

## 2023-07-11 LAB — CBC WITH DIFFERENTIAL/PLATELET
Abs Immature Granulocytes: 0.03 10*3/uL (ref 0.00–0.07)
Basophils Absolute: 0 10*3/uL (ref 0.0–0.1)
Basophils Relative: 1 %
Eosinophils Absolute: 0 10*3/uL (ref 0.0–0.5)
Eosinophils Relative: 0 %
HCT: 28.3 % — ABNORMAL LOW (ref 39.0–52.0)
Hemoglobin: 8.8 g/dL — ABNORMAL LOW (ref 13.0–17.0)
Immature Granulocytes: 0 %
Lymphocytes Relative: 12 %
Lymphs Abs: 0.9 10*3/uL (ref 0.7–4.0)
MCH: 27.7 pg (ref 26.0–34.0)
MCHC: 31.1 g/dL (ref 30.0–36.0)
MCV: 89 fL (ref 80.0–100.0)
Monocytes Absolute: 0.5 10*3/uL (ref 0.1–1.0)
Monocytes Relative: 7 %
Neutro Abs: 6.2 10*3/uL (ref 1.7–7.7)
Neutrophils Relative %: 80 %
Platelets: 461 10*3/uL — ABNORMAL HIGH (ref 150–400)
RBC: 3.18 MIL/uL — ABNORMAL LOW (ref 4.22–5.81)
RDW: 15.5 % (ref 11.5–15.5)
WBC: 7.7 10*3/uL (ref 4.0–10.5)
nRBC: 0 % (ref 0.0–0.2)

## 2023-07-11 LAB — MRSA NEXT GEN BY PCR, NASAL: MRSA by PCR Next Gen: NOT DETECTED

## 2023-07-11 MED ORDER — SODIUM CHLORIDE 0.9 % IV SOLN
2.0000 g | Freq: Three times a day (TID) | INTRAVENOUS | Status: DC
  Administered 2023-07-11 – 2023-07-13 (×7): 2 g via INTRAVENOUS
  Filled 2023-07-11 (×7): qty 12.5

## 2023-07-11 MED ORDER — OXYCODONE HCL 5 MG PO TABS
10.0000 mg | ORAL_TABLET | ORAL | Status: DC | PRN
  Administered 2023-07-12 – 2023-07-15 (×11): 10 mg via ORAL
  Filled 2023-07-11 (×14): qty 2

## 2023-07-11 MED ORDER — VANCOMYCIN HCL 1750 MG/350ML IV SOLN
1750.0000 mg | Freq: Once | INTRAVENOUS | Status: AC
  Administered 2023-07-11: 1750 mg via INTRAVENOUS
  Filled 2023-07-11: qty 350

## 2023-07-11 MED ORDER — VANCOMYCIN HCL 1500 MG/300ML IV SOLN
1500.0000 mg | Freq: Two times a day (BID) | INTRAVENOUS | Status: DC
  Administered 2023-07-11 – 2023-07-13 (×3): 1500 mg via INTRAVENOUS
  Filled 2023-07-11 (×4): qty 300

## 2023-07-11 MED ORDER — LACTATED RINGERS IV SOLN: INTRAVENOUS | Status: AC

## 2023-07-11 NOTE — Progress Notes (Signed)
   07/10/23 2050  Assess: MEWS Score  Temp 99.3 F (37.4 C)  BP (!) 176/67  MAP (mmHg) 93  Pulse Rate (!) 128  Resp 18  SpO2 96 %  O2 Device Nasal Cannula  O2 Flow Rate (L/min) 2 L/min  Assess: MEWS Score  MEWS Temp 0  MEWS Systolic 0  MEWS Pulse 2  MEWS RR 0  MEWS LOC 0  MEWS Score 2  MEWS Score Color Yellow  Assess: if the MEWS score is Yellow or Red  Were vital signs taken at a resting state? Yes  Focused Assessment Change from prior assessment (see assessment flowsheet)  Does the patient meet 2 or more of the SIRS criteria? Yes  Does the patient have a confirmed or suspected source of infection? No  MEWS guidelines implemented  Yes, yellow  Treat  MEWS Interventions Considered administering scheduled or prn medications/treatments as ordered  Take Vital Signs  Increase Vital Sign Frequency  Yellow: Q2hr x1, continue Q4hrs until patient remains green for 12hrs  Escalate  MEWS: Escalate Yellow: Discuss with charge nurse and consider notifying provider and/or RRT  Notify: Charge Nurse/RN  Name of Charge Nurse/RN Notified Amber,RN  Provider Notification  Provider Name/Title Hassan Rowan  Date Provider Notified 07/10/23  Time Provider Notified 2117  Method of Notification Page  Notification Reason Change in status  Provider response See new orders  Date of Provider Response 07/10/23  Time of Provider Response 2118  Notify: Rapid Response  Name of Rapid Response RN Notified Mindy Harper,MD  Date Rapid Response Notified 07/10/23  Time Rapid Response Notified 2154  Assess: SIRS CRITERIA  SIRS Temperature  0  SIRS Pulse 1  SIRS Respirations  0  SIRS WBC 0  SIRS Score Sum  1

## 2023-07-11 NOTE — Progress Notes (Signed)
   07/10/23 2339  Assess: MEWS Score  Temp (!) 101.2 F (38.4 C)  Assess: MEWS Score  MEWS Temp 1  MEWS Systolic 0  MEWS Pulse 3  MEWS RR 0  MEWS LOC 0  MEWS Score 4  MEWS Score Color Red  Assess: if the MEWS score is Yellow or Red  Were vital signs taken at a resting state? Yes  Focused Assessment Change from prior assessment (see assessment flowsheet)  Does the patient meet 2 or more of the SIRS criteria? Yes  Does the patient have a confirmed or suspected source of infection? No  MEWS guidelines implemented  Yes, red  Treat  MEWS Interventions Considered administering scheduled or prn medications/treatments as ordered  Take Vital Signs  Increase Vital Sign Frequency  Red: Q1hr x2, continue Q4hrs until patient remains green for 12hrs  Escalate  MEWS: Escalate Red: Discuss with charge nurse and notify provider. Consider notifying RRT. If remains red for 2 hours consider need for higher level of care  Notify: Charge Nurse/RN  Name of Charge Nurse/RN Notified Amber,RN  Provider Notification  Provider Name/Title Hassan Rowan  Date Provider Notified 07/10/23  Time Provider Notified 2339  Method of Notification Page  Notification Reason Change in status  Provider response At bedside  Date of Provider Response 07/10/23  Time of Provider Response 2339  Notify: Rapid Response  Name of Rapid Response RN Notified Mindy Harper,MD  Date Rapid Response Notified 07/10/23  Time Rapid Response Notified 2335  Assess: SIRS CRITERIA  SIRS Temperature  1  SIRS Pulse 1  SIRS Respirations  0  SIRS WBC 0  SIRS Score Sum  2

## 2023-07-11 NOTE — Progress Notes (Signed)
Pharmacy Antibiotic Note  VALON GLASSCOCK is a 67 y.o. male admitted on 06/29/2023 with pneumonia and osteomyelitis .  Pharmacy has been consulted for Vancomycin and Cefepime dosing.  Plan: Cefepime 2 g IV every 8 hours Vancomycin 1750 mg IV x1, then 1500 mg IV Q 12 hrs. Goal AUC 400-550. Expected AUC: 538.5 SCr used: 0.8  Height: 5\' 10"  (177.8 cm) Weight: 82.6 kg (182 lb 1.6 oz) IBW/kg (Calculated) : 73  Temp (24hrs), Avg:98.9 F (37.2 C), Min:97.6 F (36.4 C), Max:101.2 F (38.4 C)  Recent Labs  Lab 07/06/23 0352 07/09/23 1631 07/11/23 0300  WBC 11.1* 3.2* 7.7  CREATININE 0.74 0.56*  --     Estimated Creatinine Clearance: 95.1 mL/min (A) (by C-G formula based on SCr of 0.56 mg/dL (L)).    Allergies  Allergen Reactions   Lipitor [Atorvastatin Calcium] Other (See Comments)    Myalgias     Antimicrobials this admission: Bactrim 6/27 x1 Vanc 6/28> 7/3 Ceftriaxone 6/28 > 7/20 Dapto 7/3 > 7/20 Flagyl 6/28>Restarted 7/11>7/19> Restarted 7/20 Vancomycin 7/20 >>  Microbiology results: 7/20 BCx: in process 7/20 MRSA PCR: in process  Thank you for allowing pharmacy to be a part of this patient's care.  Dalene Carrow, PharmD 07/11/2023 9:53 AM

## 2023-07-11 NOTE — Plan of Care (Signed)
progressing 

## 2023-07-11 NOTE — Progress Notes (Addendum)
HD#11 SUBJECTIVE:  Patient Summary: [name] is a 67 y.o. male with a pertinent PMH of severe deconditioning and malnutrition who is being treated for osteomyelitis of the right greater trochanter and aspiration pneumonia.  Overnight Events: None  Interim History: He says he is feeling better this morning, sitting up and smiling.  He is not having shortness of breath.  No new changes overnight.  He ate all of his breakfast this morning. Chest tube drained 470cc.   OBJECTIVE:  Vital Signs: Vitals:   07/10/23 2339 07/11/23 0111 07/11/23 0339 07/11/23 0800  BP:  126/70 (!) 140/78 (!) 95/49  Pulse:  (!) 124 (!) 145 (!) 125  Resp:  20 20 20   Temp: (!) 101.2 F (38.4 C) 98.4 F (36.9 C) 97.7 F (36.5 C) 97.6 F (36.4 C)  TempSrc: Axillary Oral Oral Oral  SpO2:  97% 96% 98%  Weight:      Height:       Supplemental O2: Nasal Cannula SpO2: 98 % O2 Flow Rate (L/min): 3 L/min  Filed Weights   06/29/23 2155 06/30/23 0502  Weight: 83.5 kg 82.6 kg    Intake/Output Summary (Last 24 hours) at 07/11/2023 0954 Last data filed at 07/11/2023 0648 Gross per 24 hour  Intake --  Output 470 ml  Net -470 ml   Net IO Since Admission: -3,906.98 mL [07/11/23 0954]  Physical Exam: General: Sitting up in bed comfortably Pulm: Normal rate and effort of breathing no lung sounds noted Cardio: tachycardic, normal rhythm MSK: chronic contractures of bilat lower extremities Skin: wound dressings dry; no discharge or bleeding noted  Patient Lines/Drains/Airways Status     Active Line/Drains/Airways     Name Placement date Placement time Site Days   PICC Single Lumen 06/24/23 Left Basilic 44 cm 0 cm 06/24/23  1610  Basilic  12   External Urinary Catheter 06/30/23  0430  --  6   Pressure Injury 04/30/23 Hip Anterior;Left;Proximal Stage 2 -  Partial thickness loss of dermis presenting as a shallow open injury with a red, pink wound bed without slough. close to R hip 04/30/23  2200  -- 67    Pressure Injury 04/30/23 Hip Anterior;Proximal;Right Stage 2 -  Partial thickness loss of dermis presenting as a shallow open injury with a red, pink wound bed without slough. 1/2 cm by 0.8cm by 0.2 cm 04/30/23  2200  -- 67   Pressure Injury 04/30/23 Heel Right;Left Stage 1 -  Intact skin with non-blanchable redness of a localized area usually over a bony prominence. soft pressure on heels bilaterally foams applied 04/30/23  2200  -- 67   Pressure Injury 06/19/23 Buttocks Right;Upper Unstageable - Full thickness tissue loss in which the base of the injury is covered by slough (yellow, tan, gray, green or brown) and/or eschar (tan, brown or black) in the wound bed. 06/19/23  1408  -- 17   Pressure Injury 06/19/23 Buttocks Right Unstageable - Full thickness tissue loss in which the base of the injury is covered by slough (yellow, tan, gray, green or brown) and/or eschar (tan, brown or black) in the wound bed. 06/19/23  1408  -- 17   Pressure Injury 06/19/23 Buttocks Right Stage 2 -  Partial thickness loss of dermis presenting as a shallow open injury with a red, pink wound bed without slough. 06/19/23  1408  -- 17   Pressure Injury 06/19/23 Hip Left;Lateral Stage 2 -  Partial thickness loss of dermis presenting as a shallow open  injury with a red, pink wound bed without slough. 06/19/23  1408  -- 17   Pressure Injury 06/19/23 Lumbar Lateral;Right Stage 2 -  Partial thickness loss of dermis presenting as a shallow open injury with a red, pink wound bed without slough. 06/19/23  1408  -- 17   Pressure Injury 06/19/23 Heel Left;Lateral Stage 4 - Full thickness tissue loss with exposed bone, tendon or muscle. 06/19/23  1408  -- 17   Pressure Injury 06/19/23 Foot Right;Lateral Unstageable - Full thickness tissue loss in which the base of the injury is covered by slough (yellow, tan, gray, green or brown) and/or eschar (tan, brown or black) in the wound bed. 06/19/23  1408  -- 17   Pressure Injury 06/19/23 Heel  Right;Lateral Unstageable - Full thickness tissue loss in which the base of the injury is covered by slough (yellow, tan, gray, green or brown) and/or eschar (tan, brown or black) in the wound bed. 06/19/23  1408  -- 17   Pressure Injury 06/19/23 Heel Left Unstageable - Full thickness tissue loss in which the base of the injury is covered by slough (yellow, tan, gray, green or brown) and/or eschar (tan, brown or black) in the wound bed. 06/19/23  1408  -- 17   Wound / Incision (Open or Dehisced) 08/02/20 Sacrum Medial 08/02/20  1458  Sacrum  1068   Wound / Incision (Open or Dehisced) 08/03/20 Hip Left 08/03/20  1513  Hip  1067            Pertinent Labs:    Latest Ref Rng & Units 07/11/2023    3:00 AM 07/09/2023    4:31 PM 07/06/2023    3:52 AM  CBC  WBC 4.0 - 10.5 K/uL 7.7  3.2  11.1   Hemoglobin 13.0 - 17.0 g/dL 8.8  9.1  19.1   Hematocrit 39.0 - 52.0 % 28.3  29.4  32.3   Platelets 150 - 400 K/uL 461  527  620        Latest Ref Rng & Units 07/09/2023    4:31 PM 07/06/2023    3:52 AM 07/03/2023    2:19 AM  CMP  Glucose 70 - 99 mg/dL 478  295  621   BUN 8 - 23 mg/dL 20  12  16    Creatinine 0.61 - 1.24 mg/dL 3.08  6.57  8.46   Sodium 135 - 145 mmol/L 139  136  136   Potassium 3.5 - 5.1 mmol/L 3.6  3.5  3.4   Chloride 98 - 111 mmol/L 106  101  106   CO2 22 - 32 mmol/L 27  23  26    Calcium 8.9 - 10.3 mg/dL 8.1  8.3  7.9   Total Protein 6.5 - 8.1 g/dL 5.6  6.5    Total Bilirubin 0.3 - 1.2 mg/dL 0.4  0.7    Alkaline Phos 38 - 126 U/L 57  62    AST 15 - 41 U/L 15  23    ALT 0 - 44 U/L 11  16      Pleural fluid culture: no growth after 4 days Cytology pending  Blood cultures pending   ASSESSMENT/PLAN:  Assessment: Principal Problem:   Infected decubitus ulcer Active Problems:   Diabetes mellitus (HCC)   Protein-calorie malnutrition, severe   Osteomyelitis of right femur (HCC)   Goals of care, counseling/discussion   Osteomyelitis (HCC)   Aspiration pneumonia of left  lower lobe (HCC)   Parapneumonic effusion  Need for management of chest tube   Loculated pleural effusion  Plan: Aspiration Pneumonia He says he is breathing well today with no shortness of breath.  Chest tube drained 470 mL. He is still on 3 L nasal cannula.  He missed a few doses of Tylenol yesterday and spiked a fever of 101.2 with a heart rate in the 140s.  We are concerned that the Tylenol has been masking an ongoing fever, so we will stop it for now.  Blood cultures were ordered. We are switching his antibiotics to vanc/cefepime to cover for Pseudomonas and pulmonary staph infections, as well as his ongoing osteomyelitis.  -Vancomycin and cefepime dosed per pharmacy -Discontinue daptomycin and ceftriaxone -Continue Flagyl -Follow blood cultures -Cytology of pleural fluid pending -PCCM following; plan is to remove tube once drainage ceases  Severe deconditioning and weakness We are continuing to do a calorie count to monitor his nutrition.  It appears that he is entire breakfast this morning.  We encouraged him to continue to eat as much as possible as it will be very important for his wound healing and recovery from these infections.  No new changes in his management. -Calorie Count -Nutrition supplementation -PT mobility -PT wound care daily  Osteomyelitis of right greater trochanter We are broadening his antibiotics in the setting of new fever and increased tachycardia.  No other changes in management. -Vancomycin and cefepime dosed per pharmacy -Discontinue daptomycin and ceftriaxone -Hold Tylenol and monitor fever -Oxycodone 10 mg every 3 hours for moderate pain -Wound care and frequent offloading -CK, CBC with differential, CMP, ESR/CRP on Monday  Best Practice: Diet: Regular diet IVF: LR VTE: enoxaparin (LOVENOX) injection 40 mg Start: 07/05/23 2200 Code: DNR AB: IV vancomyin and cefepime; Flagyl Therapy Recs: SNF DISPO: Anticipated discharge to Skilled nursing  facility pending placement and medical stability.  Signature: Annett Fabian, MD  Internal Medicine Resident, PGY-1 [facility]  Pager: [phone] 9:54 AM, 07/11/2023   Please contact the on call pager after 5 pm and on weekends at [phone].

## 2023-07-12 NOTE — Progress Notes (Signed)
HD#12 SUBJECTIVE:  Patient Summary: [name] is a 67 y.o. with a pertinent PMH of severe deconditioning/functional paraplegia and malnutrition, who is being treated for osteomyelitis of the R greater trochanter and aspiration pneumonia  Overnight Events: No acute events overnight  Interim History: [name] was evaluated at the bedside this morning. He notes that he ate all 3 of his meals yesterday. His breathing feels fine, and he denies any chest pain.   OBJECTIVE:  Vital Signs: Vitals:   07/11/23 0800 07/11/23 1641 07/11/23 2249 07/12/23 0617  BP: (!) 95/49 (!) 136/55 (!) 142/65 121/75  Pulse: (!) 125 100 (!) 110 (!) 108  Resp: 20 18 19 18   Temp: 97.6 F (36.4 C) 97.7 F (36.5 C) 98.3 F (36.8 C) 98.2 F (36.8 C)  TempSrc: Oral Oral Oral Oral  SpO2: 98% 97% 99% 100%  Weight:      Height:       Supplemental O2: Nasal Cannula SpO2: 100 % O2 Flow Rate (L/min): 3 L/min  Filed Weights   06/29/23 2155 06/30/23 0502  Weight: 83.5 kg 82.6 kg     Intake/Output Summary (Last 24 hours) at 07/12/2023 1610 Last data filed at 07/12/2023 0600 Gross per 24 hour  Intake 2609.96 ml  Output 880 ml  Net 1729.96 ml   Net IO Since Admission: -2,007.02 mL [07/12/23 0633]  Physical Exam: General: chronically ill appearing male, laying in bed comfortably Cardio: Tachycardic rate, regular rhythm Pulmonary: Normal work of breathing on nasal cannula. Lungs clear anteriorly.  Extremities: Bilateral lower extremity chronic contractures and sarcopenia, in prevalon boots. Skin: Wound dressing on R greater trochanter is clean and dry.  Psych: Normal mood and affect   Patient Lines/Drains/Airways Status     Active Line/Drains/Airways     Name Placement date Placement time Site Days   PICC Single Lumen 06/24/23 Left Basilic 44 cm 0 cm 06/24/23  9604  Basilic  18   Chest Tube 1 Lateral;Left Pleural 14 Fr. 07/06/23  1725  Pleural  6   External Urinary Catheter 06/30/23  0430   --  12   Pressure Injury 04/30/23 Hip Anterior;Left;Proximal Stage 2 -  Partial thickness loss of dermis presenting as a shallow open injury with a red, pink wound bed without slough. close to R hip 04/30/23  2200  -- 73   Pressure Injury 04/30/23 Hip Anterior;Proximal;Right Stage 2 -  Partial thickness loss of dermis presenting as a shallow open injury with a red, pink wound bed without slough. 1/2 cm by 0.8cm by 0.2 cm 04/30/23  2200  -- 73   Pressure Injury 04/30/23 Heel Right;Left Stage 1 -  Intact skin with non-blanchable redness of a localized area usually over a bony prominence. soft pressure on heels bilaterally foams applied 04/30/23  2200  -- 73   Pressure Injury 06/19/23 Buttocks Right;Upper Unstageable - Full thickness tissue loss in which the base of the injury is covered by slough (yellow, tan, gray, green or brown) and/or eschar (tan, brown or black) in the wound bed. 06/19/23  1408  -- 23   Pressure Injury 06/19/23 Buttocks Right Unstageable - Full thickness tissue loss in which the base of the injury is covered by slough (yellow, tan, gray, green or brown) and/or eschar (tan, brown or black) in the wound bed. 06/19/23  1408  -- 23   Pressure Injury 06/19/23 Buttocks Right Stage 2 -  Partial thickness loss of dermis presenting as a shallow open injury with a red,  pink wound bed without slough. 06/19/23  1408  -- 23   Pressure Injury 06/19/23 Hip Left;Lateral Stage 2 -  Partial thickness loss of dermis presenting as a shallow open injury with a red, pink wound bed without slough. 06/19/23  1408  -- 23   Pressure Injury 06/19/23 Lumbar Lateral;Right Stage 2 -  Partial thickness loss of dermis presenting as a shallow open injury with a red, pink wound bed without slough. 06/19/23  1408  -- 23   Pressure Injury 06/19/23 Heel Left;Lateral Stage 4 - Full thickness tissue loss with exposed bone, tendon or muscle. 06/19/23  1408  -- 23   Pressure Injury 06/19/23 Foot Right;Lateral Unstageable -  Full thickness tissue loss in which the base of the injury is covered by slough (yellow, tan, gray, green or brown) and/or eschar (tan, brown or black) in the wound bed. 06/19/23  1408  -- 23   Pressure Injury 06/19/23 Heel Right;Lateral Unstageable - Full thickness tissue loss in which the base of the injury is covered by slough (yellow, tan, gray, green or brown) and/or eschar (tan, brown or black) in the wound bed. 06/19/23  1408  -- 23   Pressure Injury 06/19/23 Heel Left Unstageable - Full thickness tissue loss in which the base of the injury is covered by slough (yellow, tan, gray, green or brown) and/or eschar (tan, brown or black) in the wound bed. 06/19/23  1408  -- 23   Wound / Incision (Open or Dehisced) 08/02/20 Sacrum Medial 08/02/20  1458  Sacrum  1074   Wound / Incision (Open or Dehisced) 08/03/20 Hip Left 08/03/20  1513  Hip  1073             ASSESSMENT/PLAN:  Assessment: Principal Problem:   Infected decubitus ulcer Active Problems:   Diabetes mellitus (HCC)   Protein-calorie malnutrition, severe   Osteomyelitis of right femur (HCC)   Goals of care, counseling/discussion   Osteomyelitis (HCC)   Aspiration pneumonia of left lower lobe (HCC)   Parapneumonic effusion   Need for management of chest tube   Loculated pleural effusion   Plan: Aspiration pneumonia c/b loculated L pleural effusion Acute hypoxic respiratory failure Concern for parapneumonic effusion. Chest tube output has diminished significantly, with only ~5 cc of output noted in the last 24 hrs. PCCM plans to re-evaluate the patient tomorrow to see if the chest tube can be removed. Additionally, the patient did not have any fevers in the last 24 hrs since discontinuing tylenol and since broadening antibiotic coverage. HR improved to 110s, but still tachycardic. Repeat blood cultures from 7/20 with no growth at 24 hrs - will continue to follow.  - Continue cefepime + flagyl for aspiration pneumonia until  chest tube pulled - Follow up cytology  Osteomyelitis of R greater trochanter Patient was to continue on daptomycin and ceftriaxone through 8/9, however, antibiotics were broadened yesterday in the setting of a new fever.  - Continue vanc and cefepime, dosing per pharmacy - Continue to hold tylenol - Oxycodone 10 mg q3h PRN for pain - Wound care, offloading - Weekly CK, CBC w diff, CMP, ESR/ CRP  Severe deconditioning and weakness Started a calorie count as the patient's appetite has been poor. The patient was able to eat all 3 meals yesterday. Will hold off on Cortrak at this time.   Best Practice: Diet: Regular diet IVF: Fluids: none VTE: enoxaparin (LOVENOX) injection 40 mg Start: 07/05/23 2200 Code: DNR AB: Vanc, cefepime, flagyl Therapy Recs: SNF  Family Contact: brother, [name], to be notified. DISPO: Anticipated discharge to Skilled nursing facility pending  medical stability .  Signature: Elza Rafter, D.O.  Internal Medicine Resident, PGY-3 [facility]  Pager: [phone] 6:33 AM, 07/12/2023   Please contact the on call pager after 5 pm and on weekends at [phone].

## 2023-07-13 ENCOUNTER — Inpatient Hospital Stay (HOSPITAL_COMMUNITY): Payer: 59

## 2023-07-13 DIAGNOSIS — R109 Unspecified abdominal pain: Secondary | ICD-10-CM

## 2023-07-13 LAB — CBC WITH DIFFERENTIAL/PLATELET
Abs Immature Granulocytes: 0.04 10*3/uL (ref 0.00–0.07)
Basophils Absolute: 0 10*3/uL (ref 0.0–0.1)
Basophils Relative: 1 %
Eosinophils Absolute: 0.2 10*3/uL (ref 0.0–0.5)
Eosinophils Relative: 3 %
HCT: 26.4 % — ABNORMAL LOW (ref 39.0–52.0)
Hemoglobin: 7.9 g/dL — ABNORMAL LOW (ref 13.0–17.0)
Immature Granulocytes: 1 %
Lymphocytes Relative: 21 %
Lymphs Abs: 1.4 10*3/uL (ref 0.7–4.0)
MCH: 27 pg (ref 26.0–34.0)
MCHC: 29.9 g/dL — ABNORMAL LOW (ref 30.0–36.0)
MCV: 90.1 fL (ref 80.0–100.0)
Monocytes Absolute: 0.7 10*3/uL (ref 0.1–1.0)
Monocytes Relative: 10 %
Neutro Abs: 4.4 10*3/uL (ref 1.7–7.7)
Neutrophils Relative %: 64 %
Platelets: 462 10*3/uL — ABNORMAL HIGH (ref 150–400)
RBC: 2.93 MIL/uL — ABNORMAL LOW (ref 4.22–5.81)
RDW: 15.4 % (ref 11.5–15.5)
WBC: 6.8 10*3/uL (ref 4.0–10.5)
nRBC: 0 % (ref 0.0–0.2)

## 2023-07-13 LAB — FERRITIN: Ferritin: 313 ng/mL (ref 24–336)

## 2023-07-13 LAB — IRON AND TIBC
Iron: 17 ug/dL — ABNORMAL LOW (ref 45–182)
Saturation Ratios: 16 % — ABNORMAL LOW (ref 17.9–39.5)
TIBC: 105 ug/dL — ABNORMAL LOW (ref 250–450)
UIBC: 88 ug/dL

## 2023-07-13 LAB — VANCOMYCIN, PEAK: Vancomycin Pk: 34 ug/mL (ref 30–40)

## 2023-07-13 LAB — COMPREHENSIVE METABOLIC PANEL
ALT: 15 U/L (ref 0–44)
AST: 22 U/L (ref 15–41)
Albumin: <1.5 g/dL — ABNORMAL LOW (ref 3.5–5.0)
Alkaline Phosphatase: 52 U/L (ref 38–126)
Anion gap: 6 (ref 5–15)
BUN: 22 mg/dL (ref 8–23)
CO2: 26 mmol/L (ref 22–32)
Calcium: 8 mg/dL — ABNORMAL LOW (ref 8.9–10.3)
Chloride: 105 mmol/L (ref 98–111)
Creatinine, Ser: 0.53 mg/dL — ABNORMAL LOW (ref 0.61–1.24)
GFR, Estimated: >60 mL/min (ref >60)
Glucose, Bld: 104 mg/dL — ABNORMAL HIGH (ref 70–99)
Potassium: 3.6 mmol/L (ref 3.5–5.1)
Sodium: 137 mmol/L (ref 135–145)
Total Bilirubin: 0.6 mg/dL (ref 0.3–1.2)
Total Protein: 5.1 g/dL — ABNORMAL LOW (ref 6.5–8.1)

## 2023-07-13 LAB — CK: Total CK: 32 U/L — ABNORMAL LOW (ref 49–397)

## 2023-07-13 LAB — C-REACTIVE PROTEIN: CRP: 13.1 mg/dL — ABNORMAL HIGH (ref <1.0)

## 2023-07-13 LAB — SEDIMENTATION RATE: Sed Rate: 95 mm/hr — ABNORMAL HIGH (ref 0–16)

## 2023-07-13 MED ORDER — SODIUM CHLORIDE 0.9 % IV SOLN
8.0000 mg/kg | Freq: Every day | INTRAVENOUS | Status: DC
  Administered 2023-07-13 – 2023-07-16 (×4): 600 mg via INTRAVENOUS
  Filled 2023-07-13 (×4): qty 12

## 2023-07-13 MED ORDER — SODIUM CHLORIDE 0.9 % IV SOLN
2.0000 g | INTRAVENOUS | Status: DC
  Administered 2023-07-13 – 2023-07-16 (×4): 2 g via INTRAVENOUS
  Filled 2023-07-13 (×4): qty 20

## 2023-07-13 NOTE — Progress Notes (Signed)
Pt refuse dressing change.

## 2023-07-13 NOTE — Progress Notes (Signed)
   NAME:  [name], MRN:  [id number], DOB:  Feb 12, 1956, LOS: 13 ADMISSION DATE:  06/29/2023, CONSULTATION DATE:  07/06/2023 REFERRING MD:  Dr. Toula Moos - IMTS, CHIEF COMPLAINT:  Pleural effusion    History of Present Illness:  [name] is a 67 y.o. male with a PMH significant for HTN, HLD, diabetes and failure to thrive who presented to the ED 06/29/2023 for request of placement assistance. During admission patient has been diagnosed with aspiration PNA with osteomyelitis of right greater trochanter and severe deconditioning.   AM 7/15 patient was complaining of worsening dyspnea with elevated D-dimer therefore CTA chest was obtained that again showed unchanged loculated left pleural effusion. PCCM consulted for assistance in management and possible need for chest tube.  Pertinent  Medical History   HTN, HLD, diabetes and failure to thrive   Significant Hospital Events: Including procedures, antibiotic start and stop dates in addition to other pertinent events   7/8 presented for assistance in placement  7/15 PCCM consulted, CTA chest repeated with  moderate left loculated pleural effusion  07/06/2023 left chest tube placed 07/09/2023 left chest tube clamped and repeat cytology sent. 7/22 400 cc last 18 hours  Interim History / Subjective:   No follow up or chest xray since 7/19. No water in water seal compartment, 20 cc sterile saline added by me this AM. CXR ordered.  Objective   Blood pressure 112/62, pulse 95, temperature 97.8 F (36.6 C), temperature source Oral, resp. rate 19, height 5\' 10"  (1.778 m), weight 82.6 kg, SpO2 98%.        Intake/Output Summary (Last 24 hours) at 07/13/2023 1219 Last data filed at 07/13/2023 1150 Gross per 24 hour  Intake 1385 ml  Output 1155 ml  Net 230 ml   Filed Weights   06/29/23 2155 06/30/23 0502  Weight: 83.5 kg 82.6 kg    Examination: Gen:      No acute distress, chronically ill apprearing HEENT:  EOMI, sclera anicteric Neck:     No  masses; no thyromegaly Lungs:    Clear to auscultation bilaterally; normal respiratory effort CV:         Regular rate and rhythm; no murmurs Abd:      + bowel sounds; soft, non-tender; no palpable masses, no distension Ext:    No edema; adequate peripheral perfusion Skin:      Warm and dry; no rash Neuro: Responds yes or no to questions.  CXR 7/19 reviewed with patchy airspace disease, trace left effusion  Resolved Hospital Problem list     Assessment & Plan:  Moderate loculated left pleural effusion > concern for parapneumonic Acute respiratory failure with hypoxia Likely aspiration pneumonia - CTA chest repeated with moderate left loculated pleural effusion  P: Left chest tube placed without problem 07/06/2023 400 cc output 7/22 Daily CXR while tube in place, continue to suction  Continue chest tube until drainage is less than 200 cc a day Cytology negative   Best Practice (right click and "Reselect all SmartList Selections" daily)  Per primary   Signature:   Karren Burly, MD [facility] See Amion for pager  If no response to pager , please call [phone] until 7pm After 7:00 pm call Elink  [phone] 07/13/2023, 12:19 PM

## 2023-07-13 NOTE — TOC Progression Note (Signed)
Transition of Care [facility]) - Progression Note    Patient Details  Name: FABIANO GINLEY MRN: [id number] Date of Birth: [birth year]  Transition of Care [facility]) CM/SW Contact  Mearl Latin, LCSW Phone Number: 07/13/2023, 4:47 PM  Clinical Narrative:    CSW continuing to follow.    Expected Discharge Plan: Skilled Nursing Facility Barriers to Discharge: Continued Medical Work up, English as a second language teacher  Expected Discharge Plan and Services                                               Social Determinants of Health (SDOH) Interventions SDOH Screenings   Food Insecurity: No Food Insecurity (06/30/2023)  Recent Concern: Food Insecurity - Food Insecurity Present (06/24/2023)  Housing: Low Risk  (06/30/2023)  Transportation Needs: No Transportation Needs (06/30/2023)  Utilities: Not At Risk (06/30/2023)  Tobacco Use: High Risk (06/29/2023)    Readmission Risk Interventions    06/22/2023    4:22 PM 05/04/2023    1:50 PM  Readmission Risk Prevention Plan  Transportation Screening Complete Complete  PCP or Specialist Appt within 5-7 Days  Complete  PCP or Specialist Appt within 3-5 Days Complete   Home Care Screening  Complete  Medication Review (RN CM)  Referral to Pharmacy  HRI or Home Care Consult Complete   Social Work Consult for Recovery Care Planning/Counseling Complete   Palliative Care Screening Complete   Medication Review Oceanographer) Complete

## 2023-07-13 NOTE — Plan of Care (Signed)
?  Problem: Clinical Measurements: ?Goal: Will remain free from infection ?Outcome: Progressing ?Goal: Diagnostic test results will improve ?Outcome: Progressing ?Goal: Respiratory complications will improve ?Outcome: Progressing ?  ?

## 2023-07-13 NOTE — Progress Notes (Addendum)
HD#13 SUBJECTIVE:  Patient Summary: [name] is a 67 y.o. with a pertinent PMH of severe deconditioning/functional paraplegia and malnutrition, who is being treated for osteomyelitis of the right greater trochanter and aspiration pneumonia.   Overnight Events: None  Interim History: The patient was evaluated at bedside.  He is breathing okay, some shortness of breath but unchanged from yesterday.  His pain is unchanged.  He says he has been eating the majority of his meals.  He also reports some new abdominal pain that began yesterday.  He had 1 bowel movement, which he said was loose.  OBJECTIVE:  Vital Signs: Vitals:   07/12/23 0617 07/12/23 1300 07/12/23 2011 07/13/23 0400  BP: 121/75 117/71 118/67 (!) 142/90  Pulse: (!) 108  (!) 110 91  Resp: 18 19 18 19   Temp: 98.2 F (36.8 C) 98.3 F (36.8 C) 98 F (36.7 C) 97.8 F (36.6 C)  TempSrc: Oral Oral Oral Oral  SpO2: 100% 98% 98% 100%  Weight:      Height:       Supplemental O2: Nasal Cannula SpO2: 100 % O2 Flow Rate (L/min): 3 L/min  Filed Weights   06/29/23 2155 06/30/23 0502  Weight: 83.5 kg 82.6 kg    Intake/Output Summary (Last 24 hours) at 07/13/2023 2956 Last data filed at 07/13/2023 0400 Gross per 24 hour  Intake 895 ml  Output 800 ml  Net 95 ml   Net IO Since Admission: -1,912.02 mL [07/13/23 0605]  Physical Exam: General: Sitting in bed, comfortable, on 3 L nasal cannula Pulm: Slightly, normal respiratory effort, no rales heard this mornign Cardio: Regular rate and rhythm MSK: generalized weakness, contractures of the lower extremities bilaterally Abdomen: Mild, diffuse tenderness to palpation; soft   Patient Lines/Drains/Airways Status     Active Line/Drains/Airways     Name Placement date Placement time Site Days   PICC Single Lumen 06/24/23 Left Basilic 44 cm 0 cm 06/24/23  2130  Basilic  19   Chest Tube 1 Lateral;Left Pleural 14 Fr. 07/06/23  1725  Pleural  7   External Urinary Catheter  06/30/23  0430  --  13   Pressure Injury 04/30/23 Hip Anterior;Left;Proximal Stage 2 -  Partial thickness loss of dermis presenting as a shallow open injury with a red, pink wound bed without slough. close to R hip 04/30/23  2200  -- 74   Pressure Injury 04/30/23 Hip Anterior;Proximal;Right Stage 2 -  Partial thickness loss of dermis presenting as a shallow open injury with a red, pink wound bed without slough. 1/2 cm by 0.8cm by 0.2 cm 04/30/23  2200  -- 74   Pressure Injury 04/30/23 Heel Right;Left Stage 1 -  Intact skin with non-blanchable redness of a localized area usually over a bony prominence. soft pressure on heels bilaterally foams applied 04/30/23  2200  -- 74   Pressure Injury 06/19/23 Buttocks Right;Upper Unstageable - Full thickness tissue loss in which the base of the injury is covered by slough (yellow, tan, gray, green or brown) and/or eschar (tan, brown or black) in the wound bed. 06/19/23  1408  -- 24   Pressure Injury 06/19/23 Buttocks Right Unstageable - Full thickness tissue loss in which the base of the injury is covered by slough (yellow, tan, gray, green or brown) and/or eschar (tan, brown or black) in the wound bed. 06/19/23  1408  -- 24   Pressure Injury 06/19/23 Buttocks Right Stage 2 -  Partial thickness loss of dermis presenting as  a shallow open injury with a red, pink wound bed without slough. 06/19/23  1408  -- 24   Pressure Injury 06/19/23 Hip Left;Lateral Stage 2 -  Partial thickness loss of dermis presenting as a shallow open injury with a red, pink wound bed without slough. 06/19/23  1408  -- 24   Pressure Injury 06/19/23 Lumbar Lateral;Right Stage 2 -  Partial thickness loss of dermis presenting as a shallow open injury with a red, pink wound bed without slough. 06/19/23  1408  -- 24   Pressure Injury 06/19/23 Heel Left;Lateral Stage 4 - Full thickness tissue loss with exposed bone, tendon or muscle. 06/19/23  1408  -- 24   Pressure Injury 06/19/23 Foot Right;Lateral  Unstageable - Full thickness tissue loss in which the base of the injury is covered by slough (yellow, tan, gray, green or brown) and/or eschar (tan, brown or black) in the wound bed. 06/19/23  1408  -- 24   Pressure Injury 06/19/23 Heel Right;Lateral Unstageable - Full thickness tissue loss in which the base of the injury is covered by slough (yellow, tan, gray, green or brown) and/or eschar (tan, brown or black) in the wound bed. 06/19/23  1408  -- 24   Pressure Injury 06/19/23 Heel Left Unstageable - Full thickness tissue loss in which the base of the injury is covered by slough (yellow, tan, gray, green or brown) and/or eschar (tan, brown or black) in the wound bed. 06/19/23  1408  -- 24   Wound / Incision (Open or Dehisced) 08/02/20 Sacrum Medial 08/02/20  1458  Sacrum  1075   Wound / Incision (Open or Dehisced) 08/03/20 Hip Left 08/03/20  1513  Hip  1074            Pertinent Labs:    Latest Ref Rng & Units 07/13/2023    3:07 AM 07/11/2023    3:00 AM 07/09/2023    4:31 PM  CBC  WBC 4.0 - 10.5 K/uL 6.8  7.7  3.2   Hemoglobin 13.0 - 17.0 g/dL 7.9  8.8  9.1   Hematocrit 39.0 - 52.0 % 26.4  28.3  29.4   Platelets 150 - 400 K/uL 462  461  527        Latest Ref Rng & Units 07/13/2023    3:07 AM 07/09/2023    4:31 PM 07/06/2023    3:52 AM  CMP  Glucose 70 - 99 mg/dL 621  308  657   BUN 8 - 23 mg/dL 22  20  12    Creatinine 0.61 - 1.24 mg/dL 8.46  9.62  9.52   Sodium 135 - 145 mmol/L 137  139  136   Potassium 3.5 - 5.1 mmol/L 3.6  3.6  3.5   Chloride 98 - 111 mmol/L 105  106  101   CO2 22 - 32 mmol/L 26  27  23    Calcium 8.9 - 10.3 mg/dL 8.0  8.1  8.3   Total Protein 6.5 - 8.1 g/dL 5.1  5.6  6.5   Total Bilirubin 0.3 - 1.2 mg/dL 0.6  0.4  0.7   Alkaline Phos 38 - 126 U/L 52  57  62   AST 15 - 41 U/L 22  15  23    ALT 0 - 44 U/L 15  11  16     Blood cultures: no growth  Cytology: no malignant cells  CRP: 13.1 ESR: 95 CK: 32  MRSA PCR: not detected   ASSESSMENT/PLAN:   Assessment: Principal  Problem:   Infected decubitus ulcer Active Problems:   Diabetes mellitus (HCC)   Protein-calorie malnutrition, severe   Osteomyelitis of right femur (HCC)   Goals of care, counseling/discussion   Osteomyelitis (HCC)   Aspiration pneumonia of left lower lobe (HCC)   Parapneumonic effusion   Need for management of chest tube   Loculated pleural effusion   Plan: Aspiration pneumonia Loculated left pleural effusion s/p chest tube Has not had any fevers in the last 24 hours off of Tylenol.  Blood cultures from 7/20 are still negative.  Heart rate improved, now in the 90s.  Pleural fluid cytology showed no malignant cells, reactive mesothelial cells consistent with chronic inflammation.  He had 130 cc output from his chest tube, so we will continue Flagyl until his chest tube is removed. MRSA PCR is negative. Spoke with ID, who recommended switching back to daptomycin and ceftriaxone as he was originally on to treat his osteomyelitis. -Ceftriaxone (until 8/9 for osteo) and Flagyl -PCCM following; appreciate their assistance -Follow blood cultures  Osteomyelitis of the right greater trochanter He was started on vancomycin and cefepime in the setting of a new fever over the weekend, which has resolved. We have a low suspicion for MRSA or pseudomonas, so we will switch back to his original antibiotic regimen of daptomycin and ceftriaxone through 8/9. -IV daptomycin and ceftriaxone until 8/9 -Oxycodone 10 mg every 3 hours as needed for moderate pain -Daily wound care, frequent offloading  Abdominal pain Loose stool Given significant broad-spectrum antibiotic use, we will have a low threshold for C. Difficile testing. We will continue to monitor closely for worsening abdominal pain/diarrhea.   Severe deconditioning and weakness We are continuing a calorie count for concerns of malnutrition.  Patient is eating better than previously. We will hold off for now on Cortrak  placement.    Best Practice: Diet: Regular diet IVF:  VTE: enoxaparin (LOVENOX) injection 40 mg Start: 07/05/23 2200 Code: DNR AB: Vanc, cefepime, flagyl Therapy Recs: SNF, pending authorization Family Contact: brother, [name], to be notified. DISPO: Anticipated discharge to SNF pending medical stability   Signature: Annett Fabian, MD  Internal Medicine Resident, PGY-1 [facility]  Pager: [phone] 6:05 AM, 07/13/2023   Please contact the on call pager after 5 pm and on weekends at [phone].

## 2023-07-14 ENCOUNTER — Inpatient Hospital Stay (HOSPITAL_COMMUNITY): Payer: 59

## 2023-07-14 LAB — CULTURE, BLOOD (ROUTINE X 2)
Culture: NO GROWTH
Special Requests: ADEQUATE

## 2023-07-14 LAB — CBC
HCT: 30.2 % — ABNORMAL LOW (ref 39.0–52.0)
Hemoglobin: 9.4 g/dL — ABNORMAL LOW (ref 13.0–17.0)
MCH: 27.1 pg (ref 26.0–34.0)
MCHC: 31.1 g/dL (ref 30.0–36.0)
MCV: 87 fL (ref 80.0–100.0)
Platelets: 597 10*3/uL — ABNORMAL HIGH (ref 150–400)
RBC: 3.47 MIL/uL — ABNORMAL LOW (ref 4.22–5.81)
RDW: 15.4 % (ref 11.5–15.5)
WBC: 10.9 10*3/uL — ABNORMAL HIGH (ref 4.0–10.5)
nRBC: 0 % (ref 0.0–0.2)

## 2023-07-14 MED ORDER — ENSURE ENLIVE PO LIQD
237.0000 mL | Freq: Three times a day (TID) | ORAL | Status: DC
  Administered 2023-07-14 – 2023-07-16 (×3): 237 mL via ORAL

## 2023-07-14 NOTE — Progress Notes (Signed)
Physical Therapy Wound Treatment and discharge Patient Details  Name: [name] MRN: [id number] Date of Birth: 1955/12/28  Today's Date: 07/14/2023 Time: 4098-1191 Time Calculation (min): 28 min  Subjective  Subjective Assessment Subjective: Pt agreeable to wound therapy session Patient and Family Stated Goals: For wound to heal Date of Onset:  (Present on admission) Prior Treatments: bedside debridement and wound therapy prior admission  Pain Score:   Patient with pain with positioning, however reports none with wound care  Wound Assessment  Pressure Injury 06/19/23 Buttocks Right Unstageable - Full thickness tissue loss in which the base of the injury is covered by slough (yellow, tan, gray, green or brown) and/or eschar (tan, brown or black) in the wound bed. (Active)  Wound Image   07/14/23 1501  Dressing Type Foam - Lift dressing to assess site every shift;Gauze (Comment);Barrier Film (skin prep);Moist to moist;Other (Comment) 07/14/23 1501  Dressing Clean, Dry, Intact;Changed 07/14/23 1501  Dressing Change Frequency Twice a day 07/14/23 1501  State of Healing Early/partial granulation 07/14/23 1501  Site / Wound Assessment Yellow;Red;Brown 07/14/23 1501  % Wound base Red or Granulating 70% 07/14/23 1501  % Wound base Yellow/Fibrinous Exudate 30% 07/14/23 1501  % Wound base Black/Eschar 0% 07/14/23 1501  % Wound base Other/Granulation Tissue (Comment) 0% 07/14/23 1501  Peri-wound Assessment Pink 07/14/23 1501  Wound Length (cm) 9.3 cm 07/14/23 1501  Wound Width (cm) 7.5 cm 07/14/23 1501  Wound Depth (cm) 1.6 cm 07/14/23 1501  Wound Surface Area (cm^2) 69.75 cm^2 07/14/23 1501  Wound Volume (cm^3) 111.6 cm^3 07/14/23 1501  Tunneling (cm) 0 07/01/23 1504  Undermining (cm) from 12:00-7:00 deepest at 4:00 5.9 07/14/23 1501  Margins Unattached edges (unapproximated) 07/14/23 1501  Drainage Amount Copious 07/14/23 1501  Drainage Description Serosanguineous 07/14/23 1501   Treatment Debridement (Selective);Irrigation 07/14/23 1501      Selective Debridement (non-excisional) Selective Debridement (non-excisional) - Location: right buttocks wound (proximal and distal have merged) Selective Debridement (non-excisional) - Tools Used: Forceps, Scalpel Selective Debridement (non-excisional) - Tissue Removed: yellow and brown necrotic tissue at both wound locations    Wound Assessment and Plan  Wound Therapy - Assess/Plan/Recommendations Wound Therapy - Clinical Statement: Necrotic tissue now solely in the  undermining areas which is difficult to access for debridement. Will notify MD re: surgery should be re-consulted as they may need to further open wound to allow access to necrotic areas. Patient is being discharged from hydrotherapy as we cannot access deep area for debridement. MD and nursing made aware. Wound Therapy - Functional Problem List: immobility Factors Delaying/Impairing Wound Healing: Immobility Hydrotherapy Plan: Debridement, Dressing change, Patient/family education Wound Therapy - Frequency: 2X / week Wound Therapy - Follow Up Recommendations: dressing changes by RN  Wound Therapy Goals- Improve the function of patient's integumentary system by progressing the wound(s) through the phases of wound healing (inflammation - proliferation - remodeling) by: Wound Therapy Goals - Improve the function of patient's integumentary system by progressing the wound(s) through the phases of wound healing by: Decrease Necrotic Tissue to: 20% Decrease Necrotic Tissue - Progress: Partly met Increase Granulation Tissue to: 80% Increase Granulation Tissue - Progress: Partly met Goals/treatment plan/discharge plan were made with and agreed upon by patient/family: Yes Time For Goal Achievement: 7 days Wound Therapy - Potential for Goals: Fair  Goals will be updated until maximal potential achieved or discharge criteria met.  Discharge criteria: when goals  achieved, discharge from hospital, MD decision/surgical intervention, no progress towards goals, refusal/missing three consecutive treatments without  notification or medical reason.  GP    Patient is being discharged from hydrotherapy services secondary to:  Goals partly met and no further hydrotherapy needs identified. Necrotic tissue is inaccessible for debridement due to depth of undermining.    Please see latest Therapy Progress Note for current wound status.   Progress and discharge plan and discussed with patient/caregiver and they  Agree  Charges PT Wound Care Charges $Wound Debridement up to 20 cm: < or equal to 20 cm $ Wound Debridement each add'l 20 sqcm: 1 $PT Hydrotherapy Visit: 1 Visit   [name], PT Acute Rehabilitation Services  Office [phone]       Zena Amos 07/14/2023, 3:28 PM

## 2023-07-14 NOTE — Progress Notes (Signed)
Decrease oxygen liter down to 1mL, pt tolerating well. Oxygen level was 95% at 1200. Will monitor pt oxygen level and level of conscious during my shift.

## 2023-07-14 NOTE — Progress Notes (Signed)
Calorie Count Note  48 hour calorie count ordered.  Diet: Regular  Supplements: Ensure Enlive - BID  Estimated Nutritional Needs:  Kcal:  2130-8657 kcals Protein:  125-145 gm Fluid:  >/= 2 L/day  7/20 Results 1 meal - 100%, unsure of what due to computer downtime Supplements: 2 Ensures given, unsure completions  7/21 Results Lunch: 151 kcal, 10 gm protein Supplements: 1 Ensures given, unsure completions  7/22 Results Breakfast: nothing documented on ticket Lunch: 100 kcal, 6 gm protein Supplements: 2 Ensures given, unsure completions  Nutrition Diagnosis: Severe Malnutrition related to acute illness as evidenced by severe fat depletion, severe muscle depletion, energy intake < 75% for > 7 days.   Goal: Patient will meet greater than or equal to 90% of their needs   Intervention:  Continue Ensure Enlive po TID, each supplement provides 350 kcal and 20 grams of protein. Continue 1 packet Juven BID, each packet provides 95 calories, 2.5 grams of protein (collagen), and 9.8 grams of carbohydrate (3 grams sugar); also contains 7 grams of L-arginine and L-glutamine, 300 mg vitamin C, 15 mg vitamin E, 1.2 mcg vitamin B-12, 9.5 mg zinc, 200 mg calcium, and 1.5 g  Calcium Beta-hydroxy-Beta-methylbutyrate to support wound healing Multivitamin w/ minerals daily Magic cup TID with meals, each supplement provides 290 kcal and 9 grams of protein  Meal ordering with assist    Kirby Crigler RD, LDN Clinical Dietitian See [facility] for contact information.

## 2023-07-14 NOTE — Plan of Care (Signed)
  Problem: Elimination: Goal: Will not experience complications related to bowel motility Outcome: Progressing Goal: Will not experience complications related to urinary retention Outcome: Progressing   

## 2023-07-14 NOTE — Progress Notes (Signed)
Nutrition Follow-up  DOCUMENTATION CODES:   Severe malnutrition in context of acute illness/injury  INTERVENTION:  Increase Ensure Enlive po TID, each supplement provides 350 kcal and 20 grams of protein. Continue -1 packet Juven BID, each packet provides 95 calories, 2.5 grams of protein (collagen), and 9.8 grams of carbohydrate (3 grams sugar); also contains 7 grams of L-arginine and L-glutamine, 300 mg vitamin C, 15 mg vitamin E, 1.2 mcg vitamin B-12, 9.5 mg zinc, 200 mg calcium, and 1.5 g  Calcium Beta-hydroxy-Beta-methylbutyrate to support wound healing Multivitamin w/ minerals daily Magic cup TID with meals, each supplement provides 290 kcal and 9 grams of protein Discontinue Calorie Count  Recommend obtaining new weight due to no weight in > 7 days Meal ordering with assist   NUTRITION DIAGNOSIS:   Severe Malnutrition related to acute illness as evidenced by severe fat depletion, severe muscle depletion, energy intake < 75% for > 7 days. - Remains applicable  GOAL:   Patient will meet greater than or equal to 90% of their needs - Ongoing   MONITOR:   PO intake, Supplement acceptance  REASON FOR ASSESSMENT:   Malnutrition Screening Tool    ASSESSMENT:   67 y.o. male admits related to infected decubitus ulcer. PMH includes: HTN, DM, HLD, paraplegia, severe deconditioning. Pt is currently receiving medical management related to infected decubitus ulcer and aspiration pneumonia.  7/08 - Admitted  7/15 - L chest tube placed 7/23 - Calorie count concluded  Pt laying in bed, brother at bedside. Reports that he does not want any more chicken. Reports drinking 3 Ensure's per day and 1 Juven per day. Denies any nausea or vomiting. Discussed making meal ordering with assist to allow pt to pick foods he enjoys, pt agreeable.   Discussed with RN, reports that pt did not eat breakfast this morning.   Calorie count concluded, please see separate note. Discussed with MD.   Meal  Intake 7/18-7/23: 0-100% x 8 meals (average 44%)  Medications reviewed and include: Vitamin C, Flagyl, MVI, Senokot-S, Zinc Sulfate, IV antibiotics  Labs reviewed.   UOP: 400 mL x 24 hrs Chest tube output: 225 mL x 24 hrs   Diet Order:   Diet Order             Diet regular Room service appropriate? Yes; Fluid consistency: Thin  Diet effective now                   EDUCATION NEEDS:   Not appropriate for education at this time  Skin:  Skin Assessment: Skin Integrity Issues: Skin Integrity Issues:: Stage IV Stage II: R buttocks; L Hip; R lateral lumbar Stage IV: L lateral heel Unstageable: R upper buttocks; R lateral foot; R lateral heel; L heel  Last BM:  7/21  Height:   Ht Readings from Last 1 Encounters:  06/30/23 5\' 10"  (1.778 m)    Weight:   Wt Readings from Last 1 Encounters:  06/30/23 82.6 kg   BMI:  Body mass index is 26.13 kg/m.  Estimated Nutritional Needs:  Kcal:  0981-1914 kcals Protein:  125-145 gm Fluid:  >/= 2 L/day   Kirby Crigler RD, LDN Clinical Dietitian See [facility] for contact information.

## 2023-07-14 NOTE — Progress Notes (Signed)
   NAME:  [name], MRN:  [id number], DOB:  02/17/1956, LOS: 14 ADMISSION DATE:  06/29/2023, CONSULTATION DATE:  07/06/2023 REFERRING MD:  Dr. Toula Moos - IMTS, CHIEF COMPLAINT:  Pleural effusion    History of Present Illness:  [name] is a 67 y.o. male with a PMH significant for HTN, HLD, diabetes and failure to thrive who presented to the ED 06/29/2023 for request of placement assistance. During admission patient has been diagnosed with aspiration PNA with osteomyelitis of right greater trochanter and severe deconditioning.   AM 7/15 patient was complaining of worsening dyspnea with elevated D-dimer therefore CTA chest was obtained that again showed unchanged loculated left pleural effusion. PCCM consulted for assistance in management and possible need for chest tube.  Pertinent  Medical History   HTN, HLD, diabetes and failure to thrive   Significant Hospital Events: Including procedures, antibiotic start and stop dates in addition to other pertinent events   7/8 presented for assistance in placement  7/15 PCCM consulted, CTA chest repeated with  moderate left loculated pleural effusion  07/06/2023 left chest tube placed 07/09/2023 left chest tube clamped and repeat cytology sent. 7/22 400 cc last 18 hours  Interim History / Subjective:   No follow up or chest xray since 7/19. No water in water seal compartment, 20 cc sterile saline added by me this AM. CXR ordered.  Objective   Blood pressure 129/60, pulse (!) 102, temperature 97.6 F (36.4 C), temperature source Oral, resp. rate 19, height 5\' 10"  (1.778 m), weight 82.6 kg, SpO2 93%.        Intake/Output Summary (Last 24 hours) at 07/14/2023 1858 Last data filed at 07/14/2023 1756 Gross per 24 hour  Intake 550 ml  Output 975 ml  Net -425 ml   Filed Weights   06/29/23 2155 06/30/23 0502  Weight: 83.5 kg 82.6 kg    Examination:   CXR 7/23 reviewed with patchy airspace disease, no  left effusion  Resolved Hospital  Problem list     Assessment & Plan:  Moderate loculated left pleural effusion > concern for parapneumonic Acute respiratory failure with hypoxia Likely aspiration pneumonia - CTA chest repeated with moderate left loculated pleural effusion  P: Left chest tube placed without problem 07/06/2023 300 cc output 7/23 Daily CXR while tube in place, continue to suction  Continue chest tube until drainage is less than 200 cc a day Cytology negative   Best Practice (right click and "Reselect all SmartList Selections" daily)  Per primary   Signature:   Karren Burly, MD [facility] See [facility] for pager  If no response to pager , please call [phone] until 7pm After 7:00 pm call Elink  [phone] 07/14/2023, 6:58 PM

## 2023-07-14 NOTE — Progress Notes (Signed)
HD#14 SUBJECTIVE:  Patient Summary: [name] is a 67 y.o. with a pertinent PMH of severe deconditioning/functional paraplegia and malnutrition, who is being treated for osteomyelitis of the right greater trochanter and aspiration pneumonia.   Overnight Events: None  Interim History: Patient was evaluated at bedside this morning.  He says he is feeling okay, no new changes overnight.  He is not having any respiratory difficulty.  He is not in any new pain. He states his abdominal pain has resolved and he has not had any loose stools.  OBJECTIVE:  Vital Signs: Vitals:   07/13/23 1659 07/13/23 2148 07/14/23 0348 07/14/23 0804  BP: 118/77 137/78 (!) 124/57 (!) 125/56  Pulse: (!) 103 60 95   Resp: 18   17  Temp: 98 F (36.7 C) 98.9 F (37.2 C)  97.9 F (36.6 C)  TempSrc: Oral Oral  Oral  SpO2: 96% 92% 93% 96%  Weight:      Height:       Supplemental O2: Nasal Cannula SpO2: 96 % O2 Flow Rate (L/min): 2 L/min  Filed Weights   06/29/23 2155 06/30/23 0502  Weight: 83.5 kg 82.6 kg    Intake/Output Summary (Last 24 hours) at 07/14/2023 1129 Last data filed at 07/14/2023 9811 Gross per 24 hour  Intake 642 ml  Output 1100 ml  Net -458 ml   Net IO Since Admission: -2,010.02 mL [07/14/23 1129]  Physical Exam: General: Sitting in bed, on 2L [place] Pulm: Normal rate and rhythm, no accessory muscle use, very mild rales on left  Cardio: regular rate and rhythm, no murmurs Abdomen: soft, non-tender, non-distended  Patient Lines/Drains/Airways Status     Active Line/Drains/Airways     Name Placement date Placement time Site Days   PICC Single Lumen 06/24/23 Left Basilic 44 cm 0 cm 06/24/23  9147  Basilic  19   Chest Tube 1 Lateral;Left Pleural 14 Fr. 07/06/23  1725  Pleural  7   External Urinary Catheter 06/30/23  0430  --  13   Pressure Injury 04/30/23 Hip Anterior;Left;Proximal Stage 2 -  Partial thickness loss of dermis presenting as a shallow open injury with a red, pink  wound bed without slough. close to R hip 04/30/23  2200  -- 74   Pressure Injury 04/30/23 Hip Anterior;Proximal;Right Stage 2 -  Partial thickness loss of dermis presenting as a shallow open injury with a red, pink wound bed without slough. 1/2 cm by 0.8cm by 0.2 cm 04/30/23  2200  -- 74   Pressure Injury 04/30/23 Heel Right;Left Stage 1 -  Intact skin with non-blanchable redness of a localized area usually over a bony prominence. soft pressure on heels bilaterally foams applied 04/30/23  2200  -- 74   Pressure Injury 06/19/23 Buttocks Right;Upper Unstageable - Full thickness tissue loss in which the base of the injury is covered by slough (yellow, tan, gray, green or brown) and/or eschar (tan, brown or black) in the wound bed. 06/19/23  1408  -- 24   Pressure Injury 06/19/23 Buttocks Right Unstageable - Full thickness tissue loss in which the base of the injury is covered by slough (yellow, tan, gray, green or brown) and/or eschar (tan, brown or black) in the wound bed. 06/19/23  1408  -- 24   Pressure Injury 06/19/23 Buttocks Right Stage 2 -  Partial thickness loss of dermis presenting as a shallow open injury with a red, pink wound bed without slough. 06/19/23  1408  -- 24   Pressure  Injury 06/19/23 Hip Left;Lateral Stage 2 -  Partial thickness loss of dermis presenting as a shallow open injury with a red, pink wound bed without slough. 06/19/23  1408  -- 24   Pressure Injury 06/19/23 Lumbar Lateral;Right Stage 2 -  Partial thickness loss of dermis presenting as a shallow open injury with a red, pink wound bed without slough. 06/19/23  1408  -- 24   Pressure Injury 06/19/23 Heel Left;Lateral Stage 4 - Full thickness tissue loss with exposed bone, tendon or muscle. 06/19/23  1408  -- 24   Pressure Injury 06/19/23 Foot Right;Lateral Unstageable - Full thickness tissue loss in which the base of the injury is covered by slough (yellow, tan, gray, green or brown) and/or eschar (tan, brown or black) in the  wound bed. 06/19/23  1408  -- 24   Pressure Injury 06/19/23 Heel Right;Lateral Unstageable - Full thickness tissue loss in which the base of the injury is covered by slough (yellow, tan, gray, green or brown) and/or eschar (tan, brown or black) in the wound bed. 06/19/23  1408  -- 24   Pressure Injury 06/19/23 Heel Left Unstageable - Full thickness tissue loss in which the base of the injury is covered by slough (yellow, tan, gray, green or brown) and/or eschar (tan, brown or black) in the wound bed. 06/19/23  1408  -- 24   Wound / Incision (Open or Dehisced) 08/02/20 Sacrum Medial 08/02/20  1458  Sacrum  1075   Wound / Incision (Open or Dehisced) 08/03/20 Hip Left 08/03/20  1513  Hip  1074            Pertinent Labs:    Latest Ref Rng & Units 07/14/2023    4:06 AM 07/13/2023    3:07 AM 07/11/2023    3:00 AM  CBC  WBC 4.0 - 10.5 K/uL 10.9  6.8  7.7   Hemoglobin 13.0 - 17.0 g/dL 9.4  7.9  8.8   Hematocrit 39.0 - 52.0 % 30.2  26.4  28.3   Platelets 150 - 400 K/uL 597  462  461        Latest Ref Rng & Units 07/13/2023    3:07 AM 07/09/2023    4:31 PM 07/06/2023    3:52 AM  CMP  Glucose 70 - 99 mg/dL 161  096  045   BUN 8 - 23 mg/dL 22  20  12    Creatinine 0.61 - 1.24 mg/dL 4.09  8.11  9.14   Sodium 135 - 145 mmol/L 137  139  136   Potassium 3.5 - 5.1 mmol/L 3.6  3.6  3.5   Chloride 98 - 111 mmol/L 105  106  101   CO2 22 - 32 mmol/L 26  27  23    Calcium 8.9 - 10.3 mg/dL 8.0  8.1  8.3   Total Protein 6.5 - 8.1 g/dL 5.1  5.6  6.5   Total Bilirubin 0.3 - 1.2 mg/dL 0.6  0.4  0.7   Alkaline Phos 38 - 126 U/L 52  57  62   AST 15 - 41 U/L 22  15  23    ALT 0 - 44 U/L 15  11  16     Blood cultures: no growth  Iron: 17  TIBC: 105  Ferritin: 313  ASSESSMENT/PLAN:  Assessment: Principal Problem:   Infected decubitus ulcer Active Problems:   Diabetes mellitus (HCC)   Protein-calorie malnutrition, severe   Osteomyelitis of right femur (HCC)   Goals of care, counseling/discussion  Osteomyelitis (HCC)   Aspiration pneumonia of left lower lobe (HCC)   Parapneumonic effusion   Need for management of chest tube   Pleural effusion  Plan: Aspiration pneumonia Loculated left pleural effusion s/p chest tube Chest tube output 225cc.  On 2 L nasal cannula today, will try to wean.  Spoke with him about the importance of getting off of oxygen for future disposition.  He does not seem to be having any shortness of breath.  Remains afebrile.  No growth on repeat blood cultures.  -Continue ceftriaxone (until 8/9 for osteo) and Flagyl (until chest tube out) -PCCM following; appreciate their assistance  Osteomyelitis of the right greater trochanter No changes in his condition or management of osteomyelitis.  -IV daptomycin and ceftriaxone until 8/9 -Oxycodone 10 mg every 3 hours as needed for moderate pain -Daily wound care, PT hydrotherapy, frequent offloading  Abdominal pain, resolved Loose stool He reports that his abdominal pain is resolved.  He has not had any loose stools overnight.  We will continue to monitor closely given his high risk of C. difficile in the setting of his broad antibiotic use.  Severe deconditioning and weakness No new changes.  We are continuing the calorie count and encouraging the patient to eat as much as he is able to.  He has not been seen by physical therapy in a while, which is important for his healing and building up strength from his deconditioning.  We will touch base with them to ensure he is being seen regularly.  Best Practice: Diet: Regular diet IVF: None VTE: enoxaparin (LOVENOX) injection 40 mg Start: 07/05/23 2200 Code: DNR AB: Vanc, cefepime, flagyl Therapy Recs: SNF, pending authorization Family Contact: brother, [name], to be notified. DISPO: Anticipated discharge to SNF pending medical stability   Signature: Annett Fabian, MD  Internal Medicine Resident, PGY-1 [facility]  Pager:  [phone] 11:29 AM, 07/14/2023   Please contact the on call pager after 5 pm and on weekends at [phone].

## 2023-07-15 ENCOUNTER — Inpatient Hospital Stay (HOSPITAL_COMMUNITY): Payer: 59

## 2023-07-15 DIAGNOSIS — R000 Tachycardia, unspecified: Secondary | ICD-10-CM

## 2023-07-15 LAB — CULTURE, BLOOD (ROUTINE X 2): Special Requests: ADEQUATE

## 2023-07-15 LAB — POTASSIUM: Potassium: 3.7 mmol/L (ref 3.5–5.1)

## 2023-07-15 LAB — MAGNESIUM: Magnesium: 1.8 mg/dL (ref 1.7–2.4)

## 2023-07-15 MED ORDER — LACTATED RINGERS IV BOLUS
1000.0000 mL | Freq: Once | INTRAVENOUS | Status: AC
  Administered 2023-07-15: 1000 mL via INTRAVENOUS

## 2023-07-15 MED ORDER — ACETAMINOPHEN 325 MG PO TABS: 650.0000 mg | ORAL_TABLET | Freq: Four times a day (QID) | ORAL | Status: DC | PRN

## 2023-07-15 MED ORDER — METHOCARBAMOL 750 MG PO TABS
750.0000 mg | ORAL_TABLET | Freq: Four times a day (QID) | ORAL | Status: DC
  Administered 2023-07-15 – 2023-07-16 (×4): 750 mg via ORAL
  Filled 2023-07-15 (×4): qty 1

## 2023-07-15 NOTE — Progress Notes (Signed)
   07/15/23 0555  Assess: MEWS Score  Temp 99.7 F (37.6 C)  BP (!) 113/40  MAP (mmHg) (!) 64  Pulse Rate (!) 115  Resp 18  Level of Consciousness Alert  SpO2 98 %  O2 Device Nasal Cannula  O2 Flow Rate (L/min) 2 L/min  Assess: MEWS Score  MEWS Temp 0  MEWS Systolic 0  MEWS Pulse 2  MEWS RR 0  MEWS LOC 0  MEWS Score 2  MEWS Score Color Yellow  Assess: if the MEWS score is Yellow or Red  Were vital signs taken at a resting state? Yes  Focused Assessment Change from prior assessment (see assessment flowsheet)  Does the patient meet 2 or more of the SIRS criteria? No  Does the patient have a confirmed or suspected source of infection? No  MEWS guidelines implemented  Yes, yellow  Treat  MEWS Interventions Considered administering scheduled or prn medications/treatments as ordered  Take Vital Signs  Increase Vital Sign Frequency  Yellow: Q2hr x1, continue Q4hrs until patient remains green for 12hrs  Escalate  MEWS: Escalate Yellow: Discuss with charge nurse and consider notifying provider and/or RRT  Notify: Charge Nurse/RN  Name of Charge Nurse/RN Notified Windell Moulding, RN  Provider Notification  Provider Name/Title Manuela Neptune  Date Provider Notified 07/15/23  Time Provider Notified [phone]  Method of Notification Page  Notification Reason Change in status (Yellow MEWS)  Provider response Other (Comment) (Acknowledged)  Date of Provider Response 07/15/23  Time of Provider Response [phone]  Assess: SIRS CRITERIA  SIRS Temperature  0  SIRS Pulse 1  SIRS Respirations  0  SIRS WBC 0  SIRS Score Sum  1

## 2023-07-15 NOTE — Consult Note (Addendum)
[name] 10-31-1956  [id number].    Requesting MD: Sol Blazing, MD Chief Complaint/Reason for Consult: R ischial ulcer  HPI:  67 y/o M who is bedbound with lower extremity weakness who was admitted for management of osteomyelitis and decubitus ulcer of the R greater trochanter as well as aspiration PNA. He has a left-sided loculated pleural effusion with a chest tube that is being managed by pulmonary medicine. The patient has been receiving hydrotherapy to his right decubitus ulcer and general surgery is consulted for debridement of some thick fibrinous exudate.   ROS: Review of Systems  All other systems reviewed and are negative.   History reviewed. No pertinent family history.  Past Medical History:  Diagnosis Date   Cellulitis 07/17/2020   LEFT HIP   Diabetes mellitus    Dyspnea    Hyperlipidemia    Hypertension     Past Surgical History:  Procedure Laterality Date   BIOPSY  05/01/2023   Procedure: BIOPSY;  Surgeon: Beverley Fiedler, MD;  Location: [facility] ENDOSCOPY;  Service: Gastroenterology;;   ESOPHAGOGASTRODUODENOSCOPY (EGD) WITH PROPOFOL N/A 05/01/2023   Procedure: ESOPHAGOGASTRODUODENOSCOPY (EGD) WITH PROPOFOL;  Surgeon: Beverley Fiedler, MD;  Location[facility] ENDOSCOPY;  Service: Gastroenterology;  Laterality: N/A;   FEMUR IM NAIL Left 03/27/2023   Procedure: INTRAMEDULLARY (IM) RETROGRADE FEMORAL NAILING;  Surgeon: Myrene Galas, MD;  Location: [facility] OR;  Service: Orthopedics;  Laterality: Left;   SPINE SURGERY      Social History:  reports that he has been smoking cigarettes. He has a 20 pack-year smoking history. He has never used smokeless tobacco. He reports that he does not drink alcohol and does not use drugs.  Allergies:  Allergies  Allergen Reactions   Lipitor [Atorvastatin Calcium] Other (See Comments)    Myalgias     Medications Prior to Admission  Medication Sig Dispense Refill   cefTRIAXone (ROCEPHIN) IVPB Inject 2 g into the vein daily. Indication:   Decubitus ulcer/osteomyelitis  First Dose: Yes Last Day of Therapy:  07/31/23 Labs - Once weekly:  CBC/D and BMP, Labs - Once weekly: ESR and CRP Method of administration: IV Push Method of administration may be changed at the discretion of home infusion pharmacist based upon assessment of the patient and/or caregiver's ability to self-administer the medication ordered. 37 Units 0   daptomycin (CUBICIN) IVPB Inject 650 mg into the vein daily. Indication:  Decubitus ulcer/osteomyelitis  First Dose: Yes Last Day of Therapy:  07/31/23 Labs - Once weekly:  CBC/D, BMP, and CPK Labs - Once weekly: ESR and CRP Method of administration: IV Push Method of administration may be changed at the discretion of home infusion pharmacist based upon assessment of the patient and/or caregiver's ability to self-administer the medication ordered. 37 Units 0   feeding supplement (ENSURE ENLIVE / ENSURE PLUS) LIQD Take 237 mLs by mouth 3 (three) times daily between meals. 237 mL 12   gabapentin (NEURONTIN) 800 MG tablet Take 800 mg by mouth 3 (three) times daily.     LORazepam (ATIVAN) 0.5 MG tablet Take 1 tablet (0.5 mg total) by mouth every 8 (eight) hours as needed (nerve provlem). 20 tablet 1   losartan (COZAAR) 25 MG tablet Take 25 mg by mouth daily.     [EXPIRED] methocarbamol (ROBAXIN) 500 MG tablet Take 1 tablet (500 mg total) by mouth 3 (three) times daily for 7 days. 21 tablet 0   mirtazapine (REMERON SOL-TAB) 15 MG disintegrating tablet Take 1 tablet (15 mg total) by  mouth at bedtime. 90 tablet 3   Multiple Vitamin (MULTIVITAMIN WITH MINERALS) TABS tablet Take 1 tablet by mouth daily. 30 tablet 0   nutrition supplement, JUVEN, (JUVEN) PACK Take 1 packet by mouth 2 (two) times daily between meals. 60 packet 0   oxyCODONE (OXY IR/ROXICODONE) 5 MG immediate release tablet Take 5 mg by mouth 2 (two) times daily.     pantoprazole (PROTONIX) 40 MG tablet Take 1 tablet (40 mg total) by mouth 2 (two) times daily  before a meal. (Patient not taking: Reported on 05/09/2023) 60 tablet 0   senna-docusate (SENOKOT-S) 8.6-50 MG tablet Take 1 tablet by mouth at bedtime.       Physical Exam: Blood pressure 108/69, pulse (!) 115, temperature 99.1 F (37.3 C), temperature source Oral, resp. rate 16, height 5\' 10"  (1.778 m), weight 82.6 kg, SpO2 90%. General: chronically ill appearing male in NAD. HEENT: head -normocephalic, atraumatic; Eyes: PERRLA, no conjunctival injection;  Neck- Trachea is midline CV- sinus tachycardia, no m/r/g, no lower extremity edema. PRAFO boots on Pulm- breathing is slightly labored on HFNC, chest tube left side with purulent effluent Abd- soft, NT/ND GU- there is a wound lateral to right greater troch that is about 9x6x4 cm deep with undermining ,medially in the wound bed there is visible healthy muscle tissue. centrally there is exposed tendon that is viable. Laterally and inferiorly there is about 8 x 3 cm of necrotic tissue and muscle that is partially covered by overlying skin. Using an 11 blade scalpel and scissors I was able to debride some of this but not all of it (about 2x1 cm necrotic muscle removed and about 3x3 cm necrotic  tissue removed). No appreciable abscess or cellulitis.   Procedures 1.  Excisional vs non-excisional. Excisional   2.  Tool used for debridement (curette, scapel, etc.)  scissors,scalpel #11   3.  Frequency of surgical debridement.   once   4.  Measurement of total devitalized tissue (wound surface) before and after surgical debridement.   9x6x4cm before 9x6x4cm after   5.  Area and depth of devitalized tissue removed from wound.  2x1 cm necrotic muscle removed and about 3x3 cm necrotic tissue   6.  Blood loss and description of tissue removed. no blood loss, all tissue removed was necrotic   7.  Evidence of the progress of the wound's response to treatment.             A.  Current wound volume (current dimensions and depth).   9x6x4cm              B.  Presence (and extent of) of infection.  Yes - underlying osteomyelitis              C.  Presence (and extent of) of non viable tissue.  Yes             D.  Other material in the wound that is expected to inhibit healing.   There is still some more adherent necrotic tissue at the base of the wound that will need medihoney and PT hydrotherapy   8.  Was there any viable tissue removed (measurements): No     MSK- UE/LE symmetrical, no cyanosis, clubbing, or edema. Psych- Alert and Oriented x3 with appropriate affect Skin: warm and dry, no rashes or lesions   Results for orders placed or performed during the hospital encounter of 06/29/23 (from the past 48 hour(s))  CBC     Status: Abnormal  Collection Time: 07/14/23  4:06 AM  Result Value Ref Range   WBC 10.9 (H) 4.0 - 10.5 K/uL   RBC 3.47 (L) 4.22 - 5.81 MIL/uL   Hemoglobin 9.4 (L) 13.0 - 17.0 g/dL   HCT 27.2 (L) 53.6 - 64.4 %   MCV 87.0 80.0 - 100.0 fL   MCH 27.1 26.0 - 34.0 pg   MCHC 31.1 30.0 - 36.0 g/dL   RDW 03.4 74.2 - 59.5 %   Platelets 597 (H) 150 - 400 K/uL   nRBC 0.0 0.0 - 0.2 %    Comment: Performed at [facility], [address], [place], [place] [address]   DG CHEST PORT 1 VIEW  Result Date: 07/15/2023 CLINICAL DATA:  Chest tube. EXAM: PORTABLE CHEST 1 VIEW COMPARISON:  July 14, 2023. FINDINGS: Stable cardiomediastinal silhouette. Stable position of left-sided chest tube. No definite pneumothorax is noted. Stable left-sided PICC line. Stable diffuse coarse interstitial densities throughout both lungs. Bony thorax is unremarkable. IMPRESSION: Stable bilateral lung opacities are noted most consistent with scarring, although acute superimposed atypical infection or edema cannot be excluded. Stable position of left-sided chest tube without pneumothorax. Electronically Signed   By: Lupita Raider M.D.   On: 07/15/2023 08:54   DG CHEST PORT 1 VIEW  Result Date: 07/14/2023 CLINICAL DATA:  Difficulty breathing,  left chest tube in place EXAM: PORTABLE CHEST 1 VIEW COMPARISON:  Previous studies including the examination of 07/13/2023 FINDINGS: Transverse diameter of heart is slightly increased. Left chest tube is noted with its tip in the left lower lung field. A kink is noted in the left chest tube lateral to the ribs. Extensive increased interstitial markings are seen in both lungs. There is improvement in aeration in left upper lung field. Tip of PICC line is seen in the course of superior vena cava. There is blunting of right lateral CP angle. There is no pneumothorax. IMPRESSION: Diffuse increase in interstitial markings in both lungs may suggest underlying scarring and superimposed interstitial edema and interstitial pneumonia. There is improvement in aeration in left upper lung field. There is no pneumothorax. Electronically Signed   By: Ernie Avena M.D.   On: 07/14/2023 08:25   DG CHEST PORT 1 VIEW  Result Date: 07/13/2023 CLINICAL DATA:  Chest tube EXAM: PORTABLE CHEST 1 VIEW COMPARISON:  07/10/2023 FINDINGS: Left PICC line remains positioned at the level of the SVC. Left basilar chest tube in place without kinking. Normal heart size. Diffusely coarsened interstitial markings bilaterally with progression of airspace opacity, most pronounced within the left upper lobe. Probable trace bilateral pleural effusions. Right apical blebs. No evidence of a pneumothorax. IMPRESSION: 1. Left basilar chest tube in place without kinking. No evidence of a pneumothorax. 2. Progression of airspace opacity, most pronounced within the left upper lobe. Electronically Signed   By: Duanne Guess D.O.   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  He has a wound noted on his right hip secondary to preferentially laying on this side.  Debridement performed at the bedside.  Some  underlying necrotic tissue still noted at the base.  Recommend increasing dressing changes to TID with DRY kerlix gauze, medihoney or santyl, and continuing PT hydrotherapy.  No further surgical debridement warranted. We will sign off at this time.     I reviewed nursing notes, Consultant CCM notes, hospitalist notes,  last 24 h vitals and pain scores, last 48 h intake and output, last 24 h labs and trends, and last 24 h imaging results.  Adam Phenix, [facility] 07/15/2023, 9:03 AM Please see Amion for pager number during day hours 7:00am-4:30pm or 7:00am -11:30am on weekends

## 2023-07-15 NOTE — Progress Notes (Signed)
   NAME:  [name], MRN:  [id number], DOB:  02/21/1956, LOS: 15 ADMISSION DATE:  06/29/2023, CONSULTATION DATE:  07/06/2023 REFERRING MD:  Dr. Toula Moos - [facility]MT[facility], CHIEF COMPLAINT:  Pleural effusion    History of Present Illness:  [name] is a 67 y.o. male with a PMH significant for HTN, HLD, diabetes and failure to thrive who presented to the ED 06/29/2023 for request of placement assistance. During admission patient has been diagnosed with aspiration PNA with osteomyelitis of right greater trochanter and severe deconditioning.   AM 7/15 patient was complaining of worsening dyspnea with elevated D-dimer therefore CTA chest was obtained that again showed unchanged loculated left pleural effusion. PCCM consulted for assistance in management and possible need for chest tube.  Pertinent  Medical History   HTN, HLD, diabetes and failure to thrive   Significant Hospital Events: Including procedures, antibiotic start and stop dates in addition to other pertinent events   7/8 presented for assistance in placement  7/15 PCCM consulted, CTA chest repeated with  moderate left loculated pleural effusion  07/06/2023 left chest tube placed 07/09/2023 left chest tube clamped and repeat cytology sent. 7/22 400 cc last 18 hours  Interim History / Subjective:  Chest drainage is slowly decreasing  Objective   Blood pressure 108/69, pulse (!) 115, temperature 99.1 F (37.3 C), temperature source Oral, resp. rate 16, height 5\' 10"  (1.778 m), weight 82.6 kg, SpO2 90%.        Intake/Output Summary (Last 24 hours) at 07/15/2023 1007 Last data filed at 07/15/2023 0600 Gross per 24 hour  Intake 560 ml  Output 620 ml  Net -60 ml   Filed Weights   06/29/23 2155 06/30/23 0502  Weight: 83.5 kg 82.6 kg    Examination: Awake follows simple commands appears weak No JVD lymphadenopathy is appreciated Decreased breath sounds throughout left chest tube without airleak Heart sounds are distant Abdomen is obese  soft nontender Extremities with edema Chest x-ray 07/15/2023 no significant change  Resolved Hospital Problem list     Assessment & Plan:  Moderate loculated left pleural effusion > concern for parapneumonic Acute respiratory failure with hypoxia Likely aspiration pneumonia - CTA chest repeated with moderate left loculated pleural effusion  P: Left chest tube placed without problem 07/06/2023 275 cc output on 07/15/2023 Chest x-ray 07/15/2023 no significant change Continue chest tube to drainage until drainage is less than 200 cc a day Cytology is negative    Best Practice (right click and "Reselect all SmartList Selections" daily)  Per primary   Signature:   Brett Canales Samer Dutton ACNP Acute Care Nurse Practitioner [facility]/Critical [facility] Please consult Amion 07/15/2023, 10:07 AM '

## 2023-07-15 NOTE — Progress Notes (Addendum)
HD#15 SUBJECTIVE:  Patient Summary: [name] is a 67 y.o. with a pertinent PMH of severe deconditioning/functional paraplegia and malnutrition, who is being treated for osteomyelitis of the right greater trochanter and aspiration pneumonia.   Overnight Events: None  Interim History: Patient was evaluated at bedside this morning.  He reports some shortness of breath and was upset that we tried to wean his oxygen yesterday.  He is not having any abdominal pain or loose stools.  No other acute concerns were noted.  OBJECTIVE:  Vital Signs: Vitals:   07/14/23 2042 07/15/23 0555 07/15/23 0718 07/15/23 1024  BP:  (!) 113/40 108/69 102/63  Pulse:  (!) 115  (!) 147  Resp:  18 16 16   Temp:  99.7 F (37.6 C) 99.1 F (37.3 C) 98.4 F (36.9 C)  TempSrc:  Oral Oral Oral  SpO2: 92% 98% 90% 91%  Weight:      Height:       Supplemental O2: Nasal Cannula SpO2: 91 % O2 Flow Rate (L/min): 2 L/min  Filed Weights   06/29/23 2155 06/30/23 0502  Weight: 83.5 kg 82.6 kg    Intake/Output Summary (Last 24 hours) at 07/15/2023 1350 Last data filed at 07/15/2023 0600 Gross per 24 hour  Intake 500 ml  Output 570 ml  Net -70 ml   Net IO Since Admission: -2,070.02 mL [07/15/23 1350]  Physical Exam: General: Sitting up in bed, appears in some discomfort Pulm: Mild tachypnea, no abnormal lung sounds noted, no accessory muscle use present Cardio: Tachycardic with a heart rate in the 140s, regular rhythm Abdomen: Soft, nontender, nondistended Skin: Dressings dry and intact over the right hip wound, no bleeding or discharge noted  Patient Lines/Drains/Airways Status     Active Line/Drains/Airways     Name Placement date Placement time Site Days   PICC Single Lumen 06/24/23 Left Basilic 44 cm 0 cm 06/24/23  0109  Basilic  19   Chest Tube 1 Lateral;Left Pleural 14 Fr. 07/06/23  1725  Pleural  7   External Urinary Catheter 06/30/23  0430  --  13   Pressure Injury 04/30/23 Hip  Anterior;Left;Proximal Stage 2 -  Partial thickness loss of dermis presenting as a shallow open injury with a red, pink wound bed without slough. close to R hip 04/30/23  2200  -- 74   Pressure Injury 04/30/23 Hip Anterior;Proximal;Right Stage 2 -  Partial thickness loss of dermis presenting as a shallow open injury with a red, pink wound bed without slough. 1/2 cm by 0.8cm by 0.2 cm 04/30/23  2200  -- 74   Pressure Injury 04/30/23 Heel Right;Left Stage 1 -  Intact skin with non-blanchable redness of a localized area usually over a bony prominence. soft pressure on heels bilaterally foams applied 04/30/23  2200  -- 74   Pressure Injury 06/19/23 Buttocks Right;Upper Unstageable - Full thickness tissue loss in which the base of the injury is covered by slough (yellow, tan, gray, green or brown) and/or eschar (tan, brown or black) in the wound bed. 06/19/23  1408  -- 24   Pressure Injury 06/19/23 Buttocks Right Unstageable - Full thickness tissue loss in which the base of the injury is covered by slough (yellow, tan, gray, green or brown) and/or eschar (tan, brown or black) in the wound bed. 06/19/23  1408  -- 24   Pressure Injury 06/19/23 Buttocks Right Stage 2 -  Partial thickness loss of dermis presenting as a shallow open injury with a red, pink  wound bed without slough. 06/19/23  1408  -- 24   Pressure Injury 06/19/23 Hip Left;Lateral Stage 2 -  Partial thickness loss of dermis presenting as a shallow open injury with a red, pink wound bed without slough. 06/19/23  1408  -- 24   Pressure Injury 06/19/23 Lumbar Lateral;Right Stage 2 -  Partial thickness loss of dermis presenting as a shallow open injury with a red, pink wound bed without slough. 06/19/23  1408  -- 24   Pressure Injury 06/19/23 Heel Left;Lateral Stage 4 - Full thickness tissue loss with exposed bone, tendon or muscle. 06/19/23  1408  -- 24   Pressure Injury 06/19/23 Foot Right;Lateral Unstageable - Full thickness tissue loss in which the  base of the injury is covered by slough (yellow, tan, gray, green or brown) and/or eschar (tan, brown or black) in the wound bed. 06/19/23  1408  -- 24   Pressure Injury 06/19/23 Heel Right;Lateral Unstageable - Full thickness tissue loss in which the base of the injury is covered by slough (yellow, tan, gray, green or brown) and/or eschar (tan, brown or black) in the wound bed. 06/19/23  1408  -- 24   Pressure Injury 06/19/23 Heel Left Unstageable - Full thickness tissue loss in which the base of the injury is covered by slough (yellow, tan, gray, green or brown) and/or eschar (tan, brown or black) in the wound bed. 06/19/23  1408  -- 24   Wound / Incision (Open or Dehisced) 08/02/20 Sacrum Medial 08/02/20  1458  Sacrum  1075   Wound / Incision (Open or Dehisced) 08/03/20 Hip Left 08/03/20  1513  Hip  1074            Pertinent Labs:    Latest Ref Rng & Units 07/14/2023    4:06 AM 07/13/2023    3:07 AM 07/11/2023    3:00 AM  CBC  WBC 4.0 - 10.5 K/uL 10.9  6.8  7.7   Hemoglobin 13.0 - 17.0 g/dL 9.4  7.9  8.8   Hematocrit 39.0 - 52.0 % 30.2  26.4  28.3   Platelets 150 - 400 K/uL 597  462  461        Latest Ref Rng & Units 07/13/2023    3:07 AM 07/09/2023    4:31 PM 07/06/2023    3:52 AM  CMP  Glucose 70 - 99 mg/dL 295  621  308   BUN 8 - 23 mg/dL 22  20  12    Creatinine 0.61 - 1.24 mg/dL 6.57  8.46  9.62   Sodium 135 - 145 mmol/L 137  139  136   Potassium 3.5 - 5.1 mmol/L 3.6  3.6  3.5   Chloride 98 - 111 mmol/L 105  106  101   CO2 22 - 32 mmol/L 26  27  23    Calcium 8.9 - 10.3 mg/dL 8.0  8.1  8.3   Total Protein 6.5 - 8.1 g/dL 5.1  5.6  6.5   Total Bilirubin 0.3 - 1.2 mg/dL 0.6  0.4  0.7   Alkaline Phos 38 - 126 U/L 52  57  62   AST 15 - 41 U/L 22  15  23    ALT 0 - 44 U/L 15  11  16      ASSESSMENT/PLAN:  Assessment: Principal Problem:   Infected decubitus ulcer Active Problems:   Diabetes mellitus (HCC)   Protein-calorie malnutrition, severe   Osteomyelitis of right  femur (HCC)   Goals of care,  counseling/discussion   Osteomyelitis (HCC)   Aspiration pneumonia of left lower lobe (HCC)   Parapneumonic effusion   Need for management of chest tube   Pleural effusion  Plan: Aspiration pneumonia Loculated left pleural effusion s/p chest tube Chest tube output today 275 cc. Patient remains afebrile.  He is slightly tachypneic but unchanged from yesterday.  No changes in management today. -Continue ceftriaxone (until 8/9 for osteo) and Flagyl (until chest tube out) -PCCM following; appreciate their assistance  Osteomyelitis of the right greater trochanter R hip decubitus ulcer PT hydrotherapy noted some necrotic tissue in the wound yesterday which prevented them from adequate cleaning.  General surgery was consulted and did a bedside debridement this morning.  We will follow general surgery's recommendations for wound care and continue the IV antibiotics for osteomyelitis. -Dressing changes 3 times daily per general surgery recommendations with dry kerlix gauze, medihoney or santyl -Continue PT hydrotherapy -IV daptomycin and ceftriaxone until 8/9 -Tylenol q6h PRN for mild pain; Oxycodone 10 mg q3h PRN for moderate pain  Abdominal pain, resolved Loose stool, resolved No abdominal pain or loose stools in the past 24 hours.  Low suspicion for C. difficile.  Severe deconditioning and weakness Severe malnutrition in the setting of acute illness Muscle spasms Tachycardia Unfortunately the calorie count was not adequate to give a clear picture of the patient's nutrition intake.  Spoke with the clinical dietitian, who will be adding more robust nutrition supplementation to ensure that the patient receives a substantial amount of protein and calories as needed for wound healing.  His heart rate was in the 140s this morning, this may be in the setting of dehydration due to poor oral intake. -1L LR given -Increase Robaxin to 750 mg 4 times daily  Best  Practice: Diet: Regular diet IVF: None VTE: enoxaparin (LOVENOX) injection 40 mg Start: 07/05/23 2200 Code: DNR AB: Vanc, cefepime, flagyl Therapy Recs: SNF, pending authorization Family Contact: brother, [name], to be notified. DISPO: Anticipated discharge to SNF pending medical stability   Signature: Annett Fabian, MD  Internal Medicine Resident, PGY-1 [facility]  Pager: [phone] 1:50 PM, 07/15/2023   Please contact the on call pager after 5 pm and on weekends at [phone].

## 2023-07-15 NOTE — Progress Notes (Signed)
Physical Therapy Treatment Patient Details Name: [name] MRN: [id number] DOB: October 04, 1956 Today's Date: 07/15/2023   History of Present Illness 67 yo male admitted on 7/9 with decubitus ulcer; pt and brother unable to manage at home; recent admission, dc'd 7/8, returned on 7/9; Hydrotherapy initiated; 7/15 difficulty breathing with pleural effusion, L chest tube placed; with PMH of hypertension, gastric ulcers, severe deconditioning, left femoral fracture s/p IMN, previously admitted and treated for right hip osteomyelitis. Discharged with home health    PT Comments  Pt tolerated treatment well today. Pt was able to sit EOB ~30 seconds before requesting to lay back down. Pt required total A to EOB via helicopter method. No change in DC/DME recs at this time. Pt is likely close to baseline for mobility. PT will continue to follow.    Assistance Recommended at Discharge Frequent or constant Supervision/Assistance  If plan is discharge home, recommend the following:  Can travel by private vehicle    Assist for transportation;Help with stairs or ramp for entrance;Other (comment);Two people to help with walking and/or transfers;A lot of help with walking and/or transfers   No  Equipment Recommendations  Other (comment) (Per accepting facility)    Recommendations for Other Services       Precautions / Restrictions Precautions Precautions: Fall Precaution Comments: fell in 03/2023 with L femur fx (missed w/c during transfer), 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 Pt required total A via helicopter method.    Transfers                   General  transfer comment: unable total +2 assist for >4 months    Ambulation/Gait                   Stairs             Wheelchair Mobility     Tilt Bed    Modified Rankin (Stroke Patients Only)       Balance Overall balance assessment: Needs assistance Sitting-balance support: Bilateral upper extremity supported, Feet supported Sitting balance-Leahy Scale: Poor Sitting balance - Comments: Posterior and L lateral lean Postural control: Posterior lean, Left lateral lean                                  Cognition Arousal/Alertness: Awake/alert Behavior During Therapy: WFL for tasks assessed/performed Overall Cognitive Status: Within Functional Limits for tasks assessed                                          Exercises      General Comments General comments (skin integrity, edema, etc.): VSS on 2L      Pertinent Vitals/Pain Pain Assessment Pain Assessment: Faces Faces Pain Scale: Hurts even more Pain Location: Buttocks, BLE during any movement Pain Descriptors / Indicators: Constant, Grimacing, Discomfort, Spasm Pain Intervention(s): Monitored during session, Limited activity within patient's tolerance, Repositioned    Home Living  Prior Function            PT Goals (current goals can now be found in the care plan section) Progress towards PT goals: Progressing toward goals    Frequency    Min 2X/week      PT Plan Current plan remains appropriate    Co-evaluation              AM-PAC PT "6 Clicks" Mobility   Outcome Measure  Help needed turning from your back to your side while in a flat bed without using bedrails?: Total Help needed moving from lying on your back to sitting on the side of a flat bed without using bedrails?: Total Help needed moving to and from a bed to a chair (including a wheelchair)?: Total Help needed standing up from a chair using your arms (e.g.,  wheelchair or bedside chair)?: Total Help needed to walk in hospital room?: Total Help needed climbing 3-5 steps with a railing? : Total 6 Click Score: 6    End of Session Equipment Utilized During Treatment: Oxygen Activity Tolerance: Patient tolerated treatment well;Patient limited by pain Patient left: in bed;with call bell/phone within reach;with family/visitor present Nurse Communication: Mobility status;Need for lift equipment PT Visit Diagnosis: Muscle weakness (generalized) (M62.81);History of falling (Z91.81);Difficulty in walking, not elsewhere classified (R26.2)     Time: 1416-1430 PT Time Calculation (min) (ACUTE ONLY): 14 min  Charges:    $Therapeutic Activity: 8-22 mins PT General Charges $$ ACUTE PT VISIT: 1 Visit                     Shela Nevin, PT, DPT Acute Rehab Services [phone]    [name] 07/15/2023, 4:33 PM

## 2023-07-16 ENCOUNTER — Inpatient Hospital Stay (HOSPITAL_COMMUNITY): Payer: 59

## 2023-07-16 DIAGNOSIS — J918 Pleural effusion in other conditions classified elsewhere: Secondary | ICD-10-CM

## 2023-07-16 DIAGNOSIS — E11622 Type 2 diabetes mellitus with other skin ulcer: Secondary | ICD-10-CM

## 2023-07-16 DIAGNOSIS — Z7189 Other specified counseling: Secondary | ICD-10-CM

## 2023-07-16 DIAGNOSIS — J189 Pneumonia, unspecified organism: Secondary | ICD-10-CM

## 2023-07-16 DIAGNOSIS — B370 Candidal stomatitis: Secondary | ICD-10-CM

## 2023-07-16 DIAGNOSIS — R1310 Dysphagia, unspecified: Secondary | ICD-10-CM

## 2023-07-16 LAB — CBC WITH DIFFERENTIAL/PLATELET
Abs Immature Granulocytes: 0.15 10*3/uL — ABNORMAL HIGH (ref 0.00–0.07)
Basophils Absolute: 0.1 10*3/uL (ref 0.0–0.1)
Basophils Relative: 0 %
Eosinophils Absolute: 0 10*3/uL (ref 0.0–0.5)
Eosinophils Relative: 0 %
HCT: 28.5 % — ABNORMAL LOW (ref 39.0–52.0)
Hemoglobin: 9 g/dL — ABNORMAL LOW (ref 13.0–17.0)
Immature Granulocytes: 1 %
Lymphocytes Relative: 5 %
Lymphs Abs: 1 10*3/uL (ref 0.7–4.0)
MCH: 27.9 pg (ref 26.0–34.0)
MCHC: 31.6 g/dL (ref 30.0–36.0)
MCV: 88.2 fL (ref 80.0–100.0)
Monocytes Absolute: 1 10*3/uL (ref 0.1–1.0)
Monocytes Relative: 5 %
Neutro Abs: 19.5 10*3/uL — ABNORMAL HIGH (ref 1.7–7.7)
Neutrophils Relative %: 89 %
Platelets: 627 10*3/uL — ABNORMAL HIGH (ref 150–400)
RBC: 3.23 MIL/uL — ABNORMAL LOW (ref 4.22–5.81)
RDW: 15.9 % — ABNORMAL HIGH (ref 11.5–15.5)
WBC: 21.7 10*3/uL — ABNORMAL HIGH (ref 4.0–10.5)
nRBC: 0 % (ref 0.0–0.2)

## 2023-07-16 LAB — CULTURE, BLOOD (ROUTINE X 2): Culture: NO GROWTH

## 2023-07-16 MED ORDER — HYDROMORPHONE HCL 1 MG/ML IJ SOLN
0.5000 mg | INTRAMUSCULAR | Status: DC | PRN
  Administered 2023-07-16 – 2023-07-23 (×35): 0.5 mg via INTRAVENOUS
  Filled 2023-07-16 (×35): qty 0.5

## 2023-07-16 MED ORDER — LINEZOLID 600 MG/300ML IV SOLN
600.0000 mg | Freq: Two times a day (BID) | INTRAVENOUS | Status: DC
  Administered 2023-07-16 – 2023-07-22 (×13): 600 mg via INTRAVENOUS
  Filled 2023-07-16 (×17): qty 300

## 2023-07-16 MED ORDER — FLUCONAZOLE 100 MG PO TABS: 400.0000 mg | ORAL_TABLET | Freq: Every day | ORAL | Status: DC

## 2023-07-16 MED ORDER — FLUCONAZOLE IN SODIUM CHLORIDE 400-0.9 MG/200ML-% IV SOLN
400.0000 mg | Freq: Once | INTRAVENOUS | Status: AC
  Administered 2023-07-16: 400 mg via INTRAVENOUS
  Filled 2023-07-16: qty 200

## 2023-07-16 MED ORDER — HYDROXYZINE HCL 50 MG/ML IM SOLN
25.0000 mg | Freq: Four times a day (QID) | INTRAMUSCULAR | Status: DC | PRN
  Filled 2023-07-16: qty 0.5

## 2023-07-16 MED ORDER — METHOCARBAMOL 1000 MG/10ML IJ SOLN
500.0000 mg | Freq: Four times a day (QID) | INTRAVENOUS | Status: DC | PRN
  Administered 2023-07-16 – 2023-07-19 (×3): 500 mg via INTRAVENOUS
  Filled 2023-07-16 (×2): qty 500
  Filled 2023-07-16: qty 5

## 2023-07-16 MED ORDER — FLUCONAZOLE 100 MG PO TABS: 200.0000 mg | ORAL_TABLET | Freq: Every day | ORAL | Status: DC

## 2023-07-16 MED ORDER — ACETAMINOPHEN 10 MG/ML IV SOLN
1000.0000 mg | Freq: Four times a day (QID) | INTRAVENOUS | Status: AC
  Administered 2023-07-16 – 2023-07-17 (×3): 1000 mg via INTRAVENOUS
  Filled 2023-07-16 (×3): qty 100

## 2023-07-16 MED ORDER — FLUCONAZOLE IN SODIUM CHLORIDE 200-0.9 MG/100ML-% IV SOLN
200.0000 mg | INTRAVENOUS | Status: DC
  Administered 2023-07-17 – 2023-07-27 (×11): 200 mg via INTRAVENOUS
  Filled 2023-07-16 (×13): qty 100

## 2023-07-16 NOTE — Progress Notes (Signed)
Pt refusing oral meds. Micheal Justus,MD made aware

## 2023-07-16 NOTE — Progress Notes (Signed)
Physical Therapy Wound Evaluation, Treatment, and Discharge Patient Details  Name: [name] MRN: [id number] Date of Birth: 02/26/1956  Today's Date: 07/16/2023 Time: 6578-4696 Time Calculation (min): 34 min  Subjective  Subjective Assessment Subjective: Pt agreeable to wound therapy session Patient and Family Stated Goals: For wound to heal Date of Onset:  (Present on admission) Prior Treatments: bedside debridement and wound therapy prior admission  Pain Score:   No indication of pain throughout wound care  Wound Assessment  Pressure Injury 06/19/23 Buttocks Right Unstageable - Full thickness tissue loss in which the base of the injury is covered by slough (yellow, tan, gray, green or brown) and/or eschar (tan, brown or black) in the wound bed. (Active)  Wound Image   07/16/23 1453  Dressing Type Foam - Lift dressing to assess site every shift;Gauze (Comment);Barrier Film (skin prep);Moist to moist;Other (Comment);Santyl 07/16/23 1453  Dressing Clean, Dry, Intact;Changed 07/16/23 1453  Dressing Change Frequency Twice a day 07/16/23 1453  State of Healing Early/partial granulation 07/16/23 1453  Site / Wound Assessment Yellow;Red;Other (Comment) 07/16/23 1453  % Wound base Red or Granulating 70% 07/16/23 1453  % Wound base Yellow/Fibrinous Exudate 15% 07/16/23 1453  % Wound base Black/Eschar 0% 07/16/23 1453  % Wound base Other/Granulation Tissue (Comment) 15% 07/16/23 1453  Peri-wound Assessment Pink 07/16/23 1453  Wound Length (cm) 9.4 cm 07/16/23 1453  Wound Width (cm) 9.3 cm 07/16/23 1453  Wound Depth (cm) 2.7 cm 07/16/23 1453  Wound Surface Area (cm^2) 87.42 cm^2 07/16/23 1453  Wound Volume (cm^3) 236.03 cm^3 07/16/23 1453  Tunneling (cm) between proximal opening (small) and larger opening 07/16/23 1453  Undermining (cm) 12:00-8:00; at 2:00 6.5 cm 07/16/23 1453  Margins Unattached edges (unapproximated) 07/16/23 1453  Drainage Amount Copious 07/16/23 1453  Drainage  Description Serosanguineous 07/16/23 1453  Treatment Debridement (Selective);Irrigation;Other (Comment) 07/16/23 1453   Selective Debridement (non-excisional) Selective Debridement (non-excisional) - Location: right buttocks wound (proximal and distal have merged) Selective Debridement (non-excisional) - Tools Used: Forceps, Scalpel Selective Debridement (non-excisional) - Tissue Removed: yellow necrotic tissue    Wound Assessment and Plan  Wound Therapy - Assess/Plan/Recommendations Wound Therapy - Clinical Statement: Necrotic tissue around small proximal opening is very adherent and bleeds easily with attempts to debride. Continue Santyl in this area. The  undermining areas now have a large area of white, healthy tendon and a deeper area of necrotic tendon which is difficult to access for debridement. Will notify MD re: patient is being discharged from hydrotherapy as we cannot access deep area for debridement. MD and nursing made aware. Wound Therapy - Functional Problem List: immobility Factors Delaying/Impairing Wound Healing: Immobility Hydrotherapy Plan: Debridement, Dressing change, Patient/family education Wound Therapy - Frequency: 2X / week Wound Therapy - Follow Up Recommendations: dressing changes by RN  Wound Therapy Goals- Improve the function of patient's integumentary system by progressing the wound(s) through the phases of wound healing (inflammation - proliferation - remodeling) by: Wound Therapy Goals - Improve the function of patient's integumentary system by progressing the wound(s) through the phases of wound healing by: Decrease Necrotic Tissue to: 20% Decrease Necrotic Tissue - Progress: Partly met Increase Granulation Tissue to: 80% Increase Granulation Tissue - Progress: Partly met Goals/treatment plan/discharge plan were made with and agreed upon by patient/family: Yes Time For Goal Achievement: 7 days Wound Therapy - Potential for Goals: Fair  Goals will be  updated until maximal potential achieved or discharge criteria met.  Discharge criteria: when goals achieved, discharge from hospital, MD decision/surgical intervention,  no progress towards goals, refusal/missing three consecutive treatments without notification or medical reason.  GP     Charges PT Wound Care Charges $Wound Debridement up to 20 cm: < or equal to 20 cm $PT Hydrotherapy Visit: 1 Visit      [name], PT Acute Rehabilitation Services  Office [phone]   Zena Amos 07/16/2023, 3:01 PM

## 2023-07-16 NOTE — Progress Notes (Signed)
HD#16 SUBJECTIVE:  Patient Summary: [name] is a 67 y.o. with a pertinent PMH of severe deconditioning/functional paraplegia and malnutrition, who is being treated for osteomyelitis of the right greater trochanter and aspiration pneumonia.   Overnight Events: None  Interim History: Patient was evaluated at bedside this morning.  He says his shortness of breath is better than yesterday.  He is experiencing new dysphagia/odynophagia this morning and has been refusing medications as a result.  He is not having any new pain.  No abdominal pain loose stools reported.  OBJECTIVE:  Vital Signs: Vitals:   07/15/23 2037 07/16/23 0337 07/16/23 0917 07/16/23 1231  BP: (!) 153/88 (!) 114/55 115/68 123/68  Pulse: (!) 110 (!) 109 (!) 146 (!) 49  Resp: 17 18 17 17   Temp: 97.8 F (36.6 C) 98.1 F (36.7 C) 98.4 F (36.9 C) 98.6 F (37 C)  TempSrc: Oral Oral Oral Oral  SpO2: 95% 93% 93% 95%  Weight:      Height:       Supplemental O2: Nasal Cannula SpO2: 95 % O2 Flow Rate (L/min): 2 L/min  Filed Weights   06/29/23 2155 06/30/23 0502  Weight: 83.5 kg 82.6 kg    Intake/Output Summary (Last 24 hours) at 07/16/2023 1412 Last data filed at 07/16/2023 0532 Gross per 24 hour  Intake 513.5 ml  Output 780 ml  Net -266.5 ml   Net IO Since Admission: -2,336.52 mL [07/16/23 1412]  Physical Exam: General: Sitting up in bed, appears in mild discomfort on 3 L nasal cannula Pulm: Slightly tachypneic, no accessory muscle use, mild rales bilaterally Cardio: Regular rhythm, mild tachycardia Mouth: Oral thrush noted on the tongue, no lesions noted on the palate  Patient Lines/Drains/Airways Status     Active Line/Drains/Airways     Name Placement date Placement time Site Days   PICC Single Lumen 06/24/23 Left Basilic 44 cm 0 cm 06/24/23  0102  Basilic  19   Chest Tube 1 Lateral;Left Pleural 14 Fr. 07/06/23  1725  Pleural  7   External Urinary Catheter 06/30/23  0430  --  13   Pressure  Injury 04/30/23 Hip Anterior;Left;Proximal Stage 2 -  Partial thickness loss of dermis presenting as a shallow open injury with a red, pink wound bed without slough. close to R hip 04/30/23  2200  -- 74   Pressure Injury 04/30/23 Hip Anterior;Proximal;Right Stage 2 -  Partial thickness loss of dermis presenting as a shallow open injury with a red, pink wound bed without slough. 1/2 cm by 0.8cm by 0.2 cm 04/30/23  2200  -- 74   Pressure Injury 04/30/23 Heel Right;Left Stage 1 -  Intact skin with non-blanchable redness of a localized area usually over a bony prominence. soft pressure on heels bilaterally foams applied 04/30/23  2200  -- 74   Pressure Injury 06/19/23 Buttocks Right;Upper Unstageable - Full thickness tissue loss in which the base of the injury is covered by slough (yellow, tan, gray, green or brown) and/or eschar (tan, brown or black) in the wound bed. 06/19/23  1408  -- 24   Pressure Injury 06/19/23 Buttocks Right Unstageable - Full thickness tissue loss in which the base of the injury is covered by slough (yellow, tan, gray, green or brown) and/or eschar (tan, brown or black) in the wound bed. 06/19/23  1408  -- 24   Pressure Injury 06/19/23 Buttocks Right Stage 2 -  Partial thickness loss of dermis presenting as a shallow open injury with a  red, pink wound bed without slough. 06/19/23  1408  -- 24   Pressure Injury 06/19/23 Hip Left;Lateral Stage 2 -  Partial thickness loss of dermis presenting as a shallow open injury with a red, pink wound bed without slough. 06/19/23  1408  -- 24   Pressure Injury 06/19/23 Lumbar Lateral;Right Stage 2 -  Partial thickness loss of dermis presenting as a shallow open injury with a red, pink wound bed without slough. 06/19/23  1408  -- 24   Pressure Injury 06/19/23 Heel Left;Lateral Stage 4 - Full thickness tissue loss with exposed bone, tendon or muscle. 06/19/23  1408  -- 24   Pressure Injury 06/19/23 Foot Right;Lateral Unstageable - Full thickness tissue  loss in which the base of the injury is covered by slough (yellow, tan, gray, green or brown) and/or eschar (tan, brown or black) in the wound bed. 06/19/23  1408  -- 24   Pressure Injury 06/19/23 Heel Right;Lateral Unstageable - Full thickness tissue loss in which the base of the injury is covered by slough (yellow, tan, gray, green or brown) and/or eschar (tan, brown or black) in the wound bed. 06/19/23  1408  -- 24   Pressure Injury 06/19/23 Heel Left Unstageable - Full thickness tissue loss in which the base of the injury is covered by slough (yellow, tan, gray, green or brown) and/or eschar (tan, brown or black) in the wound bed. 06/19/23  1408  -- 24   Wound / Incision (Open or Dehisced) 08/02/20 Sacrum Medial 08/02/20  1458  Sacrum  1075   Wound / Incision (Open or Dehisced) 08/03/20 Hip Left 08/03/20  1513  Hip  1074            Pertinent Labs:    Latest Ref Rng & Units 07/14/2023    4:06 AM 07/13/2023    3:07 AM 07/11/2023    3:00 AM  CBC  WBC 4.0 - 10.5 K/uL 10.9  6.8  7.7   Hemoglobin 13.0 - 17.0 g/dL 9.4  7.9  8.8   Hematocrit 39.0 - 52.0 % 30.2  26.4  28.3   Platelets 150 - 400 K/uL 597  462  461        Latest Ref Rng & Units 07/15/2023    5:36 PM 07/13/2023    3:07 AM 07/09/2023    4:31 PM  CMP  Glucose 70 - 99 mg/dL  161  096   BUN 8 - 23 mg/dL  22  20   Creatinine 0.45 - 1.24 mg/dL  4.09  8.11   Sodium 914 - 145 mmol/L  137  139   Potassium 3.5 - 5.1 mmol/L 3.7  3.6  3.6   Chloride 98 - 111 mmol/L  105  106   CO2 22 - 32 mmol/L  26  27   Calcium 8.9 - 10.3 mg/dL  8.0  8.1   Total Protein 6.5 - 8.1 g/dL  5.1  5.6   Total Bilirubin 0.3 - 1.2 mg/dL  0.6  0.4   Alkaline Phos 38 - 126 U/L  52  57   AST 15 - 41 U/L  22  15   ALT 0 - 44 U/L  15  11     ASSESSMENT/PLAN:  Assessment: Principal Problem:   Infected decubitus ulcer Active Problems:   Diabetes mellitus (HCC)   Protein-calorie malnutrition, severe   Osteomyelitis of right femur (HCC)   Goals of care,  counseling/discussion   Osteomyelitis (HCC)   Aspiration pneumonia of  left lower lobe (HCC)   Parapneumonic effusion   Need for management of chest tube   Pleural effusion  Plan: Aspiration pneumonia Loculated left pleural effusion s/p chest tube Worsening pneumonia of the right lung Chest x-ray this morning redemonstrated bilateral opacities similar to the prior study, but also showed a worsening dense pneumonia on the lateral aspect of the right mid lower lung zones.  Bilateral pleural effusions were also noted, right greater than the left.  He remains on 3 L nasal cannula, satting well.  Still afebrile, hemodynamically stable.  Chest tube had 280 cc in past 24 hours.  -ID consulted for antibiotic recs -Continue ceftriaxone (until 8/9 for osteo) and Flagyl -PCCM following; appreciate their assistance  Oral/Esophageal thrush Newly worsening dysphagia/odynophagia He says he is having difficulty swallowing and now experiences pain with swallowing.  Oral thrush was noted on his tongue. No thrush noted elsewhere in his mouth, but given his difficulty and pain with swallowing we will treat him for esophageal candidiasis.  Additionally, he was refusing medications because the pills were too large and cause discomfort, so we will crush the meds we are able to for him. -SLP eval for new dysphagia -Switch medications to crushed so they are better tolerated -Fluconazole 400 mg today, followed by 200 mg daily for 14 days total  Osteomyelitis of the right greater trochanter R hip decubitus ulcer No new changes/updates to management.  -IV daptomycin and ceftriaxone until 8/9 -Frequent offloading and turning -PT hydrotherapy daily -Dressing changes 3 times daily per general surgery recommendations with dry kerlix gauze, medihoney or santyl -Tylenol q6h PRN for mild pain; Oxycodone 10 mg q3h PRN for moderate pain  Severe deconditioning and weakness Severe malnutrition in the setting of acute  illness Muscle spasms No new changes in management today.  Patient is intermittently refusing dressing changes and turning.  PT saw him yesterday and we hope they will continue to work with him to try to regain some mobility.  His muscle spasms are improved on the increase in Robaxin.  We will continue to encourage him to increase his oral intake in order to meet his daily caloric and protein needs for healing.  Best Practice: Diet: Regular diet IVF: None VTE: enoxaparin (LOVENOX) injection 40 mg Start: 07/05/23 2200 Code: DNR AB: Vanc, cefepime, flagyl Therapy Recs: SNF, pending authorization Family Contact: brother, [name], to be notified. DISPO: Anticipated discharge to SNF pending medical stability  Signature: Annett Fabian, MD  Internal Medicine Resident, PGY-1 [facility]  Pager: [phone] 2:12 PM, 07/16/2023   Please contact the on call pager after 5 pm and on weekends at [phone].

## 2023-07-16 NOTE — Progress Notes (Signed)
[facility]  Date of Admission:  06/29/2023     Total days of antibiotics 29         ASSESSMENT:  [name] continues to receive daptomycin, ceftriaxone and metronidazole for osteomyelitis through 8/9 and course complicated by development of aspiration pneumonia and loculated effusion s/p chest tube placement now with worsening pneumonia. Pleural fluid and blood cultures are negative and has been afebrile. Will change daptomycin to linezolid for lung penetration in event this is MRSA and continue with current dose of ceftriaxone and metronidazole. Continue chest tube management per Pulmonology with remaining medical and supportive care per Internal Medicine.   PLAN:  Change daptomycin to linezolid.  Continue ceftriaxone and metronidazole. Chest tube management per Pulmonology. Remaining medical and supportive care per Internal Medicine Team.    Principal Problem:   Infected decubitus ulcer Active Problems:   Diabetes mellitus (HCC)   Protein-calorie malnutrition, severe   Osteomyelitis of right femur (HCC)   Goals of care, counseling/discussion   Osteomyelitis (HCC)   Aspiration pneumonia of left lower lobe (HCC)   Parapneumonic effusion   Need for management of chest tube   Pleural effusion    ascorbic acid  500 mg Oral Daily   busPIRone  5 mg Oral TID   Chlorhexidine Gluconate Cloth  6 each Topical Q0600   collagenase   Topical Daily   enoxaparin (LOVENOX) injection  40 mg Subcutaneous Q24H   feeding supplement  237 mL Oral TID BM   [START ON 07/17/2023] fluconazole  200 mg Oral Daily   fluconazole  400 mg Oral Daily   gabapentin  800 mg Oral TID   lidocaine  1 patch Transdermal Q24H   lidocaine (PF)  5 mL Intradermal Once   losartan  25 mg Oral Daily   methocarbamol  750 mg Oral QID   metroNIDAZOLE  500 mg Oral Q8H   multivitamin with minerals  1 tablet Oral Daily   nutrition supplement (JUVEN)  1 packet Oral BID BM   senna-docusate   1 tablet Oral QHS   sodium chloride flush  10 mL Intrapleural Q8H   sodium chloride flush  10-40 mL Intracatheter Q12H   zinc sulfate  220 mg Oral Daily    SUBJECTIVE:  [name] was last seen by the ID service on 06/24/23 with plans for 6 weeks of IV antibiotics with daptomycin, ceftriaxone and metronidazole for osteomyelitis. On 07/06/23 developed worsening shortness of the breath with elevated d-dimer and found to have unchanged left loculated pleural effusion. Chest tube was placed. Continued on antibiotics with daptomycin, ceftriaxone and metronidazole. Febrile on 7/19 with blood cultures negative. Chest x-ray today with superimposed worsening pneumonia.   [name] does have shortness of breath and complains of having difficulties with swallowing. Chest tube remains in place to suction with 280 cc in the last 24 hours.   Allergies  Allergen Reactions   Lipitor [Atorvastatin Calcium] Other (See Comments)    Myalgias      Review of Systems: Review of Systems  Constitutional:  Negative for chills, fever and weight loss.  HENT:  Positive for sore throat.   Respiratory:  Positive for shortness of breath. Negative for cough and wheezing.   Cardiovascular:  Negative for chest pain and leg swelling.  Gastrointestinal:  Negative for abdominal pain, constipation, diarrhea, nausea and vomiting.  Skin:  Negative for rash.      OBJECTIVE: Vitals:   07/15/23 2037 07/16/23 2952 07/16/23 8413 07/16/23 1231  BP: (!) 153/88 (!) 114/55 115/68 123/68  Pulse: (!) 110 (!) 109 (!) 146 (!) 49  Resp: 17 18 17 17   Temp: 97.8 F (36.6 C) 98.1 F (36.7 C) 98.4 F (36.9 C) 98.6 F (37 C)  TempSrc: Oral Oral Oral Oral  SpO2: 95% 93% 93% 95%  Weight:      Height:       Body mass index is 26.13 kg/m.  Physical Exam Constitutional:      General: He is not in acute distress.    Appearance: He is well-developed.  Cardiovascular:     Rate and Rhythm: Normal rate and regular rhythm.      Heart sounds: Normal heart sounds.  Pulmonary:     Effort: Pulmonary effort is normal.     Breath sounds: Wheezing and rhonchi present.  Skin:    General: Skin is warm and dry.  Neurological:     Mental Status: He is alert and oriented to person, place, and time.  Psychiatric:        Behavior: Behavior normal.        Thought Content: Thought content normal.        Judgment: Judgment normal.     Lab Results Lab Results  Component Value Date   WBC 10.9 (H) 07/14/2023   HGB 9.4 (L) 07/14/2023   HCT 30.2 (L) 07/14/2023   MCV 87.0 07/14/2023   PLT 597 (H) 07/14/2023    Lab Results  Component Value Date   CREATININE 0.53 (L) 07/13/2023   BUN 22 07/13/2023   NA 137 07/13/2023   K 3.7 07/15/2023   CL 105 07/13/2023   CO2 26 07/13/2023    Lab Results  Component Value Date   ALT 15 07/13/2023   AST 22 07/13/2023   ALKPHOS 52 07/13/2023   BILITOT 0.6 07/13/2023     Microbiology: Recent Results (from the past 240 hour(s))  Culture, body fluid w Gram Stain-bottle     Status: None   Collection Time: 07/06/23  7:37 PM   Specimen: Pleura  Result Value Ref Range Status   Specimen Description PLEURAL  Final   Special Requests LEFT  Final   Culture   Final    NO GROWTH 5 DAYS Performed at [facility], [address], [place], [place] [address]    Report Status 07/11/2023 FINAL  Final  Gram stain     Status: None   Collection Time: 07/06/23  7:37 PM   Specimen: Pleura  Result Value Ref Range Status   Specimen Description PLEURAL  Final   Special Requests LEFT  Final   Gram Stain   Final    WBC PRESENT, PREDOMINANTLY MONONUCLEAR NO ORGANISMS SEEN CYTOSPIN SMEAR Performed at [facility], [address], [place], [place] [address]    Report Status 07/06/2023 FINAL  Final  Culture, blood (Routine X 2) w Reflex to ID Panel     Status: None   Collection Time: 07/11/23 12:32 AM   Specimen: BLOOD RIGHT HAND  Result Value Ref Range Status   Specimen Description  BLOOD RIGHT HAND  Final   Special Requests   Final    BOTTLES DRAWN AEROBIC AND ANAEROBIC Blood Culture adequate volume   Culture   Final    NO GROWTH 5 DAYS Performed at [facility], [address], [place], [place] [address]    Report Status 07/16/2023 FINAL  Final  Culture, blood (Routine X 2) w Reflex to ID Panel  Status: None   Collection Time: 07/11/23 12:33 AM   Specimen: BLOOD RIGHT HAND  Result Value Ref Range Status   Specimen Description BLOOD RIGHT HAND  Final   Special Requests   Final    BOTTLES DRAWN AEROBIC AND ANAEROBIC Blood Culture adequate volume   Culture   Final    NO GROWTH 5 DAYS Performed at [facility], [address] [place], [place] [address]    Report Status 07/16/2023 FINAL  Final  MRSA Next Gen by PCR, Nasal     Status: None   Collection Time: 07/11/23 10:04 AM   Specimen: Nasal Mucosa; Nasal Swab  Result Value Ref Range Status   MRSA by PCR Next Gen NOT DETECTED NOT DETECTED Final    Comment: (NOTE) The GeneXpert MRSA Assay (FDA approved for NASAL specimens only), is one component of a comprehensive MRSA colonization surveillance program. It is not intended to diagnose MRSA infection nor to guide or monitor treatment for MRSA infections. Test performance is not FDA approved in patients less than 75 years old. Performed at [facility], [address], [place], [place] [address]      Marcos Eke, NP [facility]  07/16/2023  3:35 PM

## 2023-07-16 NOTE — Progress Notes (Signed)
Pt refuses bath, peri care and change of dressing.

## 2023-07-16 NOTE — Progress Notes (Signed)
   07/16/23 1231  Vitals  Temp 98.6 F (37 C)  Temp Source Oral  BP 123/68  MAP (mmHg) 77  BP Location Right Arm  BP Method Automatic  Patient Position (if appropriate) Lying  Pulse Rate (!) 49  Pulse Rate Source Monitor  Resp 17  MEWS COLOR  MEWS Score Color Green  Oxygen Therapy  SpO2 95 %  O2 Device Room Air  MEWS Score  MEWS Temp 0  MEWS Systolic 0  MEWS Pulse 1  MEWS RR 0  MEWS LOC 0  MEWS Score 1

## 2023-07-16 NOTE — Progress Notes (Signed)
   07/16/23 0917  Assess: MEWS Score  Temp 98.4 F (36.9 C)  BP 115/68  MAP (mmHg) 78  Pulse Rate (!) 146  Resp 17  Level of Consciousness Alert  SpO2 93 %  O2 Device Nasal Cannula  O2 Flow Rate (L/min) 2 L/min  Assess: MEWS Score  MEWS Temp 0  MEWS Systolic 0  MEWS Pulse 3  MEWS RR 0  MEWS LOC 0  MEWS Score 3  MEWS Score Color Yellow  Assess: if the MEWS score is Yellow or Red  Were vital signs accurate and taken at a resting state? Yes  Does the patient meet 2 or more of the SIRS criteria? No  Does the patient have a confirmed or suspected source of infection? No  MEWS guidelines implemented  Yes, yellow  Treat  MEWS Interventions Considered administering scheduled or prn medications/treatments as ordered  Take Vital Signs  Increase Vital Sign Frequency  Yellow: Q2hr x1, continue Q4hrs until patient remains green for 12hrs  Escalate  MEWS: Escalate Yellow: Discuss with charge nurse and consider notifying provider and/or RRT  Notify: Charge Nurse/RN  Name of Charge Nurse/RN Notified Ben,RN  Provider Notification  Provider Name/Title Westley Gambles  Date Provider Notified 07/16/23  Time Provider Notified 0930  Method of Notification Page  Notification Reason Other (Comment) (Yellow MEWS)  Provider response At bedside  Date of Provider Response 07/16/23  Time of Provider Response 1000  Assess: SIRS CRITERIA  SIRS Temperature  0  SIRS Pulse 1  SIRS Respirations  0  SIRS WBC 0  SIRS Score Sum  1

## 2023-07-16 NOTE — Progress Notes (Signed)
SLP Cancellation Note  Patient Details Name: [name] MRN: [id number] DOB: 1956/03/30   Cancelled treatment:       Reason Eval/Treat Not Completed: Other (comment) Received new orders for a swallow evaluation on this patient. Based on notes, he was seen by SLP on 7/12 during which he appeared to have a normal swallow but at the time team was concerned about aspiration PNA/silent aspiration and decision made to complete FEES for instrumental testing of swallow. It appears that because on 7/13, patient transitioned to comfort care, that all SLP orders were discontinued so testing was never complete. Patient is no longer comfort care. After discussion with MD, decision made to defer bedside/clinical swallow exam today and proceed with MBS next date for more detailed swallow assessment. RN aware.   Ferdinand Lango MA, CCC-SLP   Tashaya Ancrum Meryl 07/16/2023, 2:06 PM

## 2023-07-17 ENCOUNTER — Inpatient Hospital Stay (HOSPITAL_COMMUNITY): Payer: 59

## 2023-07-17 LAB — PHOSPHORUS: Phosphorus: 2.9 mg/dL (ref 2.5–4.6)

## 2023-07-17 LAB — GLUCOSE, CAPILLARY: Glucose-Capillary: 121 mg/dL — ABNORMAL HIGH (ref 70–99)

## 2023-07-17 LAB — MAGNESIUM: Magnesium: 1.8 mg/dL (ref 1.7–2.4)

## 2023-07-17 MED ORDER — METRONIDAZOLE 500 MG/100ML IV SOLN
500.0000 mg | Freq: Three times a day (TID) | INTRAVENOUS | Status: DC
  Administered 2023-07-17: 500 mg via INTRAVENOUS
  Filled 2023-07-17: qty 100

## 2023-07-17 MED ORDER — ACETAMINOPHEN 10 MG/ML IV SOLN
1000.0000 mg | Freq: Four times a day (QID) | INTRAVENOUS | Status: DC
  Administered 2023-07-17 – 2023-07-18 (×3): 1000 mg via INTRAVENOUS
  Filled 2023-07-17 (×3): qty 100

## 2023-07-17 MED ORDER — THIAMINE MONONITRATE 100 MG PO TABS
100.0000 mg | ORAL_TABLET | Freq: Every day | ORAL | Status: DC
  Administered 2023-07-17 – 2023-07-27 (×11): 100 mg
  Filled 2023-07-17 (×11): qty 1

## 2023-07-17 MED ORDER — LACTATED RINGERS IV BOLUS
1000.0000 mL | Freq: Once | INTRAVENOUS | Status: AC
  Administered 2023-07-17: 1000 mL via INTRAVENOUS

## 2023-07-17 MED ORDER — SODIUM CHLORIDE 0.9 % IV SOLN
3.0000 g | Freq: Four times a day (QID) | INTRAVENOUS | Status: DC
  Administered 2023-07-17 – 2023-07-23 (×24): 3 g via INTRAVENOUS
  Filled 2023-07-17 (×23): qty 8

## 2023-07-17 MED ORDER — LACTATED RINGERS IV SOLN: INTRAVENOUS | Status: AC

## 2023-07-17 MED ORDER — PIVOT 1.5 CAL PO LIQD
1000.0000 mL | ORAL | Status: DC
  Administered 2023-07-17 – 2023-07-22 (×3): 1000 mL
  Filled 2023-07-17 (×12): qty 1000

## 2023-07-17 NOTE — Progress Notes (Signed)
[facility]  Date of Admission:  06/29/2023     Total days of antibiotics 30         ASSESSMENT:  Mr. Helming completed a modified barium swallow with findings of silent aspiration which is likely contributing to his current respiratory picture and SLP recommendation for NPO status. Will change ceftriaxone to ampicillin-sulbactam and discontinue metronidazole. Continue linezolid and fluconzole. Agree with palliative care for goals of care. Chest tube management per Pulmonology with remaining medical and supportive care per Internal Medicine.   PLAN:  Continue current dose of linezolid and fluconazole. Start ampicillin-sulbactam and discontinue metronidazole.  Agree with palliative care for goals of care.  Chest tube management per Pulmonology.  Remaining medical and supportive care per Internal Medicine.   Dr. Renold Don is available is this weekend for ID related questions or concerns.   Principal Problem:   Infected decubitus ulcer Active Problems:   Diabetes mellitus (HCC)   Protein-calorie malnutrition, severe   Osteomyelitis of right femur (HCC)   Goals of care, counseling/discussion   Osteomyelitis (HCC)   Aspiration pneumonia of left lower lobe (HCC)   Parapneumonic effusion   Need for management of chest tube   Pleural effusion   Pneumonia of right lung due to infectious organism    ascorbic acid  500 mg Oral Daily   busPIRone  5 mg Oral TID   Chlorhexidine Gluconate Cloth  6 each Topical Q0600   collagenase   Topical Daily   enoxaparin (LOVENOX) injection  40 mg Subcutaneous Q24H   feeding supplement  237 mL Oral TID BM   gabapentin  800 mg Oral TID   lidocaine  1 patch Transdermal Q24H   lidocaine (PF)  5 mL Intradermal Once   losartan  25 mg Oral Daily   multivitamin with minerals  1 tablet Oral Daily   nutrition supplement (JUVEN)  1 packet Oral BID BM   senna-docusate  1 tablet Oral QHS   sodium chloride flush  10 mL Intrapleural Q8H    sodium chloride flush  10-40 mL Intracatheter Q12H   zinc sulfate  220 mg Oral Daily    SUBJECTIVE:  Afebrile overnight with no acute events. Breathing remains about the same. Difficulty swallowing.   Allergies  Allergen Reactions   Lipitor [Atorvastatin Calcium] Other (See Comments)    Myalgias      Review of Systems: Review of Systems  Constitutional:  Negative for chills, fever and weight loss.  HENT:  Positive for sore throat.   Respiratory:  Positive for shortness of breath. Negative for cough and wheezing.   Cardiovascular:  Negative for chest pain and leg swelling.  Gastrointestinal:  Negative for abdominal pain, constipation, diarrhea, nausea and vomiting.  Skin:  Negative for rash.      OBJECTIVE: Vitals:   07/16/23 2024 07/16/23 2353 07/17/23 0435 07/17/23 0807  BP: (!) 140/64 111/64 131/81 135/73  Pulse: (!) 144 (!) 128 98 95  Resp: 19 18 18    Temp: 98.2 F (36.8 C) 98.4 F (36.9 C) 97.6 F (36.4 C) (!) 97.5 F (36.4 C)  TempSrc: Oral Oral Oral Oral  SpO2: 93% 92% 90% 94%  Weight:      Height:       Body mass index is 26.13 kg/m.  Physical Exam Constitutional:      General: He is not in acute distress.    Appearance: He is well-developed.  HENT:     Mouth/Throat:     Comments: Tongue  with coating of white.  Cardiovascular:     Rate and Rhythm: Regular rhythm. Tachycardia present.     Heart sounds: Normal heart sounds.  Pulmonary:     Effort: Pulmonary effort is normal.     Breath sounds: Normal breath sounds.  Skin:    General: Skin is warm and dry.  Neurological:     Mental Status: He is alert.  Psychiatric:        Mood and Affect: Mood normal.     Lab Results Lab Results  Component Value Date   WBC 12.5 (H) 07/17/2023   HGB 8.1 (L) 07/17/2023   HCT 25.9 (L) 07/17/2023   MCV 88.1 07/17/2023   PLT 592 (H) 07/17/2023    Lab Results  Component Value Date   CREATININE 0.63 07/17/2023   BUN 25 (H) 07/17/2023   NA 135  07/17/2023   K 3.6 07/17/2023   CL 103 07/17/2023   CO2 24 07/17/2023    Lab Results  Component Value Date   ALT 15 07/13/2023   AST 22 07/13/2023   ALKPHOS 52 07/13/2023   BILITOT 0.6 07/13/2023     Microbiology: Recent Results (from the past 240 hour(s))  Culture, blood (Routine X 2) w Reflex to ID Panel     Status: None   Collection Time: 07/11/23 12:32 AM   Specimen: BLOOD RIGHT HAND  Result Value Ref Range Status   Specimen Description BLOOD RIGHT HAND  Final   Special Requests   Final    BOTTLES DRAWN AEROBIC AND ANAEROBIC Blood Culture adequate volume   Culture   Final    NO GROWTH 5 DAYS Performed at [facility], [address] [place], [place] [address]    Report Status 07/16/2023 FINAL  Final  Culture, blood (Routine X 2) w Reflex to ID Panel     Status: None   Collection Time: 07/11/23 12:33 AM   Specimen: BLOOD RIGHT HAND  Result Value Ref Range Status   Specimen Description BLOOD RIGHT HAND  Final   Special Requests   Final    BOTTLES DRAWN AEROBIC AND ANAEROBIC Blood Culture adequate volume   Culture   Final    NO GROWTH 5 DAYS Performed at [facility], [address], [place], [place] [address]    Report Status 07/16/2023 FINAL  Final  MRSA Next Gen by PCR, Nasal     Status: None   Collection Time: 07/11/23 10:04 AM   Specimen: Nasal Mucosa; Nasal Swab  Result Value Ref Range Status   MRSA by PCR Next Gen NOT DETECTED NOT DETECTED Final    Comment: (NOTE) The GeneXpert MRSA Assay (FDA approved for NASAL specimens only), is one component of a comprehensive MRSA colonization surveillance program. It is not intended to diagnose MRSA infection nor to guide or monitor treatment for MRSA infections. Test performance is not FDA approved in patients less than 78 years old. Performed at [facility], [address] [place], [place] [address]      Marcos Eke, NP [facility]  07/17/2023  9:48 AM

## 2023-07-17 NOTE — Progress Notes (Addendum)
HD#17 SUBJECTIVE:  Patient Summary: [name] is a 67 y.o. with a pertinent PMH of severe deconditioning/functional paraplegia and malnutrition, who is being treated for osteomyelitis of the right greater trochanter and aspiration pneumonia.   Overnight Events: None  Interim History: Patient was evaluated at bedside this morning.  He says his breathing and pain are unchanged from yesterday.  We helped turn him to offload him from his right hip wound.  His main concern was his throat and inability to swallow well, for which he had a modified barium swallow this morning.  OBJECTIVE:  Vital Signs: Vitals:   07/16/23 2353 07/17/23 0435 07/17/23 0807 07/17/23 1221  BP: 111/64 131/81 135/73 131/75  Pulse: (!) 128 98 95 92  Resp: 18 18  16   Temp: 98.4 F (36.9 C) 97.6 F (36.4 C) (!) 97.5 F (36.4 C) 97.7 F (36.5 C)  TempSrc: Oral Oral Oral Oral  SpO2: 92% 90% 94% 99%  Weight:      Height:       Supplemental O2: Nasal Cannula SpO2: 99 % O2 Flow Rate (L/min): 2 L/min  Filed Weights   06/29/23 2155 06/30/23 0502  Weight: 83.5 kg 82.6 kg    Intake/Output Summary (Last 24 hours) at 07/17/2023 1312 Last data filed at 07/17/2023 4098 Gross per 24 hour  Intake 1637.37 ml  Output 1005 ml  Net 632.37 ml   Net IO Since Admission: -1,704.15 mL [07/17/23 1312]  Physical Exam: General: Appears in mild discomfort, laying in bed Pulm: Mildly increased rate and respiratory effort, on 2 L with minimal accessory muscle use; lung sounds unchanged from yesterday Cardio: Mildly tachycardic, normal rhythm Mouth: Oral thrush present  Patient Lines/Drains/Airways Status     Active Line/Drains/Airways     Name Placement date Placement time Site Days   PICC Single Lumen 06/24/23 Left Basilic 44 cm 0 cm 06/24/23  1191  Basilic  19   Chest Tube 1 Lateral;Left Pleural 14 Fr. 07/06/23  1725  Pleural  7   External Urinary Catheter 06/30/23  0430  --  13   Pressure Injury 04/30/23 Hip  Anterior;Left;Proximal Stage 2 -  Partial thickness loss of dermis presenting as a shallow open injury with a red, pink wound bed without slough. close to R hip 04/30/23  2200  -- 74   Pressure Injury 04/30/23 Hip Anterior;Proximal;Right Stage 2 -  Partial thickness loss of dermis presenting as a shallow open injury with a red, pink wound bed without slough. 1/2 cm by 0.8cm by 0.2 cm 04/30/23  2200  -- 74   Pressure Injury 04/30/23 Heel Right;Left Stage 1 -  Intact skin with non-blanchable redness of a localized area usually over a bony prominence. soft pressure on heels bilaterally foams applied 04/30/23  2200  -- 74   Pressure Injury 06/19/23 Buttocks Right;Upper Unstageable - Full thickness tissue loss in which the base of the injury is covered by slough (yellow, tan, gray, green or brown) and/or eschar (tan, brown or black) in the wound bed. 06/19/23  1408  -- 24   Pressure Injury 06/19/23 Buttocks Right Unstageable - Full thickness tissue loss in which the base of the injury is covered by slough (yellow, tan, gray, green or brown) and/or eschar (tan, brown or black) in the wound bed. 06/19/23  1408  -- 24   Pressure Injury 06/19/23 Buttocks Right Stage 2 -  Partial thickness loss of dermis presenting as a shallow open injury with a red, pink wound bed without  slough. 06/19/23  1408  -- 24   Pressure Injury 06/19/23 Hip Left;Lateral Stage 2 -  Partial thickness loss of dermis presenting as a shallow open injury with a red, pink wound bed without slough. 06/19/23  1408  -- 24   Pressure Injury 06/19/23 Lumbar Lateral;Right Stage 2 -  Partial thickness loss of dermis presenting as a shallow open injury with a red, pink wound bed without slough. 06/19/23  1408  -- 24   Pressure Injury 06/19/23 Heel Left;Lateral Stage 4 - Full thickness tissue loss with exposed bone, tendon or muscle. 06/19/23  1408  -- 24   Pressure Injury 06/19/23 Foot Right;Lateral Unstageable - Full thickness tissue loss in which the  base of the injury is covered by slough (yellow, tan, gray, green or brown) and/or eschar (tan, brown or black) in the wound bed. 06/19/23  1408  -- 24   Pressure Injury 06/19/23 Heel Right;Lateral Unstageable - Full thickness tissue loss in which the base of the injury is covered by slough (yellow, tan, gray, green or brown) and/or eschar (tan, brown or black) in the wound bed. 06/19/23  1408  -- 24   Pressure Injury 06/19/23 Heel Left Unstageable - Full thickness tissue loss in which the base of the injury is covered by slough (yellow, tan, gray, green or brown) and/or eschar (tan, brown or black) in the wound bed. 06/19/23  1408  -- 24   Wound / Incision (Open or Dehisced) 08/02/20 Sacrum Medial 08/02/20  1458  Sacrum  1075   Wound / Incision (Open or Dehisced) 08/03/20 Hip Left 08/03/20  1513  Hip  1074            Pertinent Labs:    Latest Ref Rng & Units 07/17/2023    4:28 AM 07/16/2023    2:54 PM 07/14/2023    4:06 AM  CBC  WBC 4.0 - 10.5 K/uL 12.5  21.7  10.9   Hemoglobin 13.0 - 17.0 g/dL 8.1  9.0  9.4   Hematocrit 39.0 - 52.0 % 25.9  28.5  30.2   Platelets 150 - 400 K/uL 592  627  597        Latest Ref Rng & Units 07/17/2023    4:28 AM 07/15/2023    5:36 PM 07/13/2023    3:07 AM  CMP  Glucose 70 - 99 mg/dL 161   096   BUN 8 - 23 mg/dL 25   22   Creatinine 0.45 - 1.24 mg/dL 4.09   8.11   Sodium 914 - 145 mmol/L 135   137   Potassium 3.5 - 5.1 mmol/L 3.6  3.7  3.6   Chloride 98 - 111 mmol/L 103   105   CO2 22 - 32 mmol/L 24   26   Calcium 8.9 - 10.3 mg/dL 8.0   8.0   Total Protein 6.5 - 8.1 g/dL   5.1   Total Bilirubin 0.3 - 1.2 mg/dL   0.6   Alkaline Phos 38 - 126 U/L   52   AST 15 - 41 U/L   22   ALT 0 - 44 U/L   15     ASSESSMENT/PLAN:  Assessment: Principal Problem:   Infected decubitus ulcer Active Problems:   Diabetes mellitus (HCC)   Protein-calorie malnutrition, severe   Osteomyelitis of right femur (HCC)   Goals of care, counseling/discussion    Osteomyelitis (HCC)   Aspiration pneumonia of left lower lobe (HCC)   Parapneumonic effusion  Need for management of chest tube   Pleural effusion   Pneumonia of right lung due to infectious organism  Plan: Aspiration pneumonia Loculated left pleural effusion s/p chest tube Worsening pneumonia of the right lung Consulted ID for the rising white count and worsening pneumonia.  Per their recommendations, daptomycin was switched to linezolid for stronger lung penetration and MRSA coverage and flagyl/ceftriaxone was switched to unasyn.  He had over 400 cc output in the past 24 hours from the chest tube.  He is satting well on 2 L nasal cannula. -Unasyn 3 g IV, Linezolid 600 mg q12h IV; discontinued ceftriaxone and metronidazole -Repeat pleural fluid cytology for greater sensitivity -PCCM following; appreciate their assistance  Oral/Esophageal thrush Newly worsening dysphagia/odynophagia Silent aspiration He had a modified barium swallow this morning, which was concerning for aspiration with possible acute on chronic dysphagia in the setting of infection and deconditioning.  SLP recommended making him NPO given his ongoing pneumonia and reengaging palliative care for discussion of goals of care.  We spoke with the patient this morning about our concerns regarding this worsening dysphagia, and he was still resistant to cortrak placement.  We will touch base with him again this afternoon to come up with a plan moving forward, both regarding cortrak placement and his goals of care. -NPO for concerns of dysphagia and continued aspiration -Medications administered IV/IM given his intolerance of oral medications -Fluconazole 200 mg IV daily, ending on 8/9  Osteomyelitis of the right greater trochanter R hip decubitus ulcer No new changes/updates to management other than antibiotic changes recommended by ID as noted above. -Unasyn 3 g IV, Linezolid 600 mg q12h IV; discontinued ceftriaxone -IV  Tylenol 1,000 mg q6h scheduled; IV dilaudid 0.5 mg q4h PRN -Frequent offloading and turning -Dressing changes 3 times daily per general surgery recommendations with dry kerlix gauze, medihoney or santyl  Severe deconditioning and weakness Severe malnutrition in the setting of acute illness We are increasingly concerned about his overall picture and poor prognosis given his severe deconditioning, inability to tolerate oral intake, acute on chronic dysphagia, silent aspiration, and worsening pneumonia despite antibiotic use.  Several weeks ago, we previously engaged him about comfort care with the palliative care team; as a result of those discussions, he was placed on comfort care.  The next day, however, he said there had been a misunderstanding and that he wanted aggressive treatment of his infections and did not want to be comfort care.  Nevertheless, given the above listed conditions we think that this is likely the direction that we are heading, and we will continue to try to engage with him about his goals of care and his understanding of his prognosis.  In the meantime, we are continuing to actively treat his infections and do all we can for his recovery.  Best Practice: Diet: Regular diet IVF: None VTE: enoxaparin (LOVENOX) injection 40 mg Start: 07/05/23 2200 Code: DNR AB: Linezolid, Unasyn Therapy Recs: SNF, pending authorization Family Contact: brother, [name], to be notified. DISPO: Anticipated discharge to SNF pending medical stability  Signature: Annett Fabian, MD  Internal Medicine Resident, PGY-1 [facility]  Pager: [phone] 1:12 PM, 07/17/2023   Please contact the on call pager after 5 pm and on weekends at [phone].

## 2023-07-17 NOTE — Procedures (Signed)
Cortrak  Person Inserting Tube:  Hunter, Rachel D, RD Tube Type:  Cortrak - 43 inches Tube Size:  10 Tube Location:  Left nare Secured by: Bridle Technique Used to Measure Tube Placement:  Marking at nare/corner of mouth Cortrak Secured At:  75 cm Procedure Comments:  Cortrak Tube Team Note:  Consult received to place a Cortrak feeding tube.   X-ray is required, abdominal x-ray has been ordered by the Cortrak team. Please confirm tube placement before using the Cortrak tube.   If the tube becomes dislodged please keep the tube and contact the Cortrak team at [email or web address] for replacement.  If after hours and replacement cannot be delayed, place a NG tube and confirm placement with an abdominal x-ray.    Rachel Hunter, RD, LDN Clinical Dietitian RD pager # available in AMION  After hours/weekend pager # available in AMION    

## 2023-07-17 NOTE — Progress Notes (Addendum)
Modified Barium Swallow Study  Patient Details  Name: [name] MRN: [id number] Date of Birth: Jan 31, 1956  Today's Date: 07/17/2023  Modified Barium Swallow completed.  Full report located under Chart Review in the Imaging Section.  History of Present Illness Pt is a 67 yo male presenting 7/8 with osteomyelitis of the right greater trochanter and aspiration pneumonia. Clinical SLP eval this admission was suggestive of functional swallowing (similar to presentation in [place], at which time pt was also admitted with PNA). Instrumental testing was recommending given concern for silent aspiration but orders were d/c 7/13 when decision was made to pursue comfort measures. Pt rescinded comfort measures 7/14 and on 7/25 SLP was reconsulted due to concern for new symptoms of dysphagia/odynophagia (tx initiated for thrush). PMH includes: severe deconditioning/functional paraplegia and malnutrition, cervical fusion   Clinical Impression Pt presents with a signifcant pharyngeal dysphagia that is suspected to be acute on chronic. Oral phase is generally functional. He has suspected cervical osteophytes that completely hinder epiglottic inversion. He also has cervical hardware around the level of the UES, with UES opening that is diminished in both distention and duration. As a result, pt has ineffective pharyngeal clearance with the majority of each bolus remaining in the pharynx. Barium entered the airway at times during the swallow (especially after his pyriform sinuses became full) but residue also spills in after the swallow. He cannot perform a volitional swallow even with cues to try to reduce residuals, and he cannot move his head into any other positions to attempt strategies. SLP did attempt a more reclined position with minimal effect. Aspiration that occurs is silent (PAS 8) and his cued cough does not clear the airway. In light of his risk factors and current concern for PNA, recommend that he be  NPO. It is possible that some of this could reflect acute deconditioning in the setting of infection and acute deconditioning; however, given his structural components and h/o PNA there is concern for a more chronic underlying dysphagia. Notified MD of results and would consider re-endaging palliative care.  Factors that may increase risk of adverse event in presence of aspiration [name] & Clearance [name]): Poor general health and/or compromised immunity;Respiratory or GI disease;Limited mobility;Frail or deconditioned;Dependence for feeding and/or oral hygiene;Inadequate oral hygiene;Weak cough;Aspiration of thick, dense, and/or acidic materials  Swallow Evaluation Recommendations Recommendations: NPO Medication Administration: Via alternative means Oral care recommendations: Oral care QID (4x/day) Recommended consults: Consider Palliative care      [name]., M.A. CCC-SLP Acute Rehabilitation Services Office [phone]  Secure chat preferred  07/17/2023,9:50 AM

## 2023-07-17 NOTE — Progress Notes (Signed)
Informed on call provider of pt's heart rate range of 128-144 along with him refusing to take any of his oral medications. Orders received.

## 2023-07-17 NOTE — Progress Notes (Signed)
Nutrition Brief Note:   Pt with Cortrak placed today. Pt seen by SLP today and has now been made NPO. Pt with significant wounds.   Skin:  Skin Assessment: Skin Integrity Issues: Skin Integrity Issues:: Stage IV Stage II: R buttocks; L Hip; R lateral lumbar Stage IV: L lateral heel Unstageable: R upper buttocks; R lateral foot; R lateral heel; L heel  Estimated Nutritional Needs:  Kcal:  0865-7846 kcals Protein:  125-145 gm Fluid:  >/= 2 L/day  Meds reviewed. Labs reviewed. I/Os reviewed: -1 L in past 24 hrs. (Pt with chest tube) IVF: LR @ 75 mL/hr  Pt is at high risk for refeeding. RD recommends slow advancement of tube feeding.   RD recommends the following regimen (once TF placement confirmed):  Initiate Pivot 1.5 @ 20 mL/hr and advance by 10 mL/hr Q8H to goal rate of 70 mL/hr.   - This will provide (1680 mL) 2520 kcals, 157 gm protein, and 1262 mL free water.  - FWF 125 mL Q4H - this will provide a total of 2012 mL free water.  - Consider additional thiamine via tube in setting of refeeding risk (will message MD).   Bethann Humble, RD, LDN, CNSC.

## 2023-07-17 NOTE — Progress Notes (Signed)
PCCM Interval Note  Chest tube output > 400cc last 24h.  Not in a position to consider removal today.   Goal output < 50-110cc/day  We will continue to follow   Levy Pupa, MD, PhD 07/17/2023, 10:54 AM [facility] [phone] or if no answer before 7:00PM call [phone] For any issues after 7:00PM please call eLink [phone]

## 2023-07-17 NOTE — TOC Progression Note (Signed)
Transition of Care [facility]) - Progression Note    Patient Details  Name: [name] MRN: [id number] Date of Birth: 30-Jul-1956  [facility] CM/SW Contact  Delilah Shan, LCSWA Phone Number: 07/17/2023, 12:18 PM  Clinical Narrative:     CSW continuing to follow.  Expected Discharge Plan: Skilled Nursing Facility Barriers to Discharge: Continued Medical Work up, English as a second language teacher  Expected Discharge Plan and Services                                               Social Determinants of Health (SDOH) Interventions SDOH Screenings   Food Insecurity: No Food Insecurity (06/30/2023)  Recent Concern: Food Insecurity - Food Insecurity Present (06/24/2023)  Housing: Low Risk  (06/30/2023)  Transportation Needs: No Transportation Needs (06/30/2023)  Utilities: Not At Risk (06/30/2023)  Tobacco Use: High Risk (06/29/2023)    Readmission Risk Interventions    06/22/2023    4:22 PM 05/04/2023    1:50 PM  Readmission Risk Prevention Plan  Transportation Screening Complete Complete  PCP or Specialist Appt within 5-7 Days  Complete  PCP or Specialist Appt within 3-5 Days Complete   Home Care Screening  Complete  Medication Review (RN CM)  Referral to Pharmacy  HRI or Home Care Consult Complete   Social Work Consult for Recovery Care Planning/Counseling Complete   Palliative Care Screening Complete   Medication Review Oceanographer) Complete

## 2023-07-17 NOTE — Progress Notes (Signed)
Chest tube canister replaced. 2000 mL sanguinous fluid marked. Chest tube to suction per order.

## 2023-07-18 ENCOUNTER — Inpatient Hospital Stay (HOSPITAL_COMMUNITY): Payer: 59

## 2023-07-18 DIAGNOSIS — J90 Pleural effusion, not elsewhere classified: Secondary | ICD-10-CM | POA: Diagnosis not present

## 2023-07-18 DIAGNOSIS — T17908A Unspecified foreign body in respiratory tract, part unspecified causing other injury, initial encounter: Secondary | ICD-10-CM | POA: Diagnosis not present

## 2023-07-18 LAB — GLUCOSE, CAPILLARY
Glucose-Capillary: 122 mg/dL — ABNORMAL HIGH (ref 70–99)
Glucose-Capillary: 162 mg/dL — ABNORMAL HIGH (ref 70–99)
Glucose-Capillary: 168 mg/dL — ABNORMAL HIGH (ref 70–99)
Glucose-Capillary: 169 mg/dL — ABNORMAL HIGH (ref 70–99)
Glucose-Capillary: 189 mg/dL — ABNORMAL HIGH (ref 70–99)
Glucose-Capillary: 201 mg/dL — ABNORMAL HIGH (ref 70–99)

## 2023-07-18 MED ORDER — ACETAMINOPHEN 10 MG/ML IV SOLN
1000.0000 mg | Freq: Four times a day (QID) | INTRAVENOUS | Status: AC
  Administered 2023-07-18 – 2023-07-19 (×2): 1000 mg via INTRAVENOUS
  Filled 2023-07-18 (×2): qty 100

## 2023-07-18 NOTE — Plan of Care (Signed)
  Problem: Education: Goal: Knowledge of General Education information will improve Description Including pain rating scale, medication(s)/side effects and non-pharmacologic comfort measures Outcome: Progressing   Problem: Nutrition: Goal: Adequate nutrition will be maintained Outcome: Progressing   Problem: Pain Managment: Goal: General experience of comfort will improve Outcome: Progressing

## 2023-07-18 NOTE — Progress Notes (Signed)
   NAME:  GEMARI KENDAL, MRN:  [id number], DOB:  1956-05-19, LOS: 18 ADMISSION DATE:  06/29/2023, CONSULTATION DATE:  07/06/2023 REFERRING MD:  Dr. Toula Moos - IMTS, CHIEF COMPLAINT:  Pleural effusion    History of Present Illness:  KAREAM KISIEL is a 67 y.o. male with a PMH significant for HTN, HLD, diabetes and failure to thrive who presented to the ED 06/29/2023 for request of placement assistance. During admission patient has been diagnosed with aspiration PNA with osteomyelitis of right greater trochanter and severe deconditioning.   AM 7/15 patient was complaining of worsening dyspnea with elevated D-dimer therefore CTA chest was obtained that again showed unchanged loculated left pleural effusion. PCCM consulted for assistance in management and possible need for chest tube.  Pertinent  Medical History   HTN, HLD, diabetes and failure to thrive   Significant Hospital Events: Including procedures, antibiotic start and stop dates in addition to other pertinent events   7/8 presented for assistance in placement  7/15 PCCM consulted, CTA chest repeated with  moderate left loculated pleural effusion  07/06/2023 left chest tube placed cyto and usual pleural labs sent -- exudative.  07/09/2023 left chest tube clamped and repeat cytology sent. 7/22 400 cc last 18 hours 7/26 >400 cc last 24 hr. EN started via cortrak. Cyto sent again 7/27 about 400cc the last 24 hr   Interim History / Subjective:  About 400cc out in the last hour   Objective   Blood pressure 124/76, pulse 82, temperature (!) 97.5 F (36.4 C), temperature source Oral, resp. rate 17, height 5\' 10"  (1.778 m), weight 87.1 kg, SpO2 97%.        Intake/Output Summary (Last 24 hours) at 07/18/2023 1141 Last data filed at 07/18/2023 0559 Gross per 24 hour  Intake 2029 ml  Output 150 ml  Net 1879 ml   Filed Weights   06/29/23 2155 06/30/23 0502 07/18/23 0351  Weight: 83.5 kg 82.6 kg 87.1 kg    Examination: General: chronically ill  debilitated appearing M NAD  Neuro: drowsy, following commands HEENT: pink mm Pulm: L pigtail with about 350cc in sahara. No air leak  CV: cap refill < 3 sec Abd: round   Resolved Hospital Problem list     Assessment & Plan:   L exudative pleural effusion, favored to be parapneumonic Acute respiratory failure with hypoxia Bilateral PNA, aspiration  -chest tube placed 7/15  Cyto 7/15, 7/18 with no malignant cells. It looks like primary has sent repeat 7/26  P: -cont routine chest tube care, flush per protocol -cont on -20 sxn  -following volume; still putting out a fair amount, not yet ready to remove  -have asked RN to mark on sahara fluid level at start/end of shift to help track output trend  -unasyn, zyvox, fluc per ID  -cyto was sent again 7/26 per primary, follow  -NPO, EN via cortrak     Best Practice (right click and "Reselect all SmartList Selections" daily)  Per primary   Signature:   Tessie Fass MSN, AGACNP-BC [facility] Amion for pager  07/18/2023, 11:41 AM

## 2023-07-18 NOTE — Progress Notes (Addendum)
HD#18 SUBJECTIVE:  Patient Summary: [name] is a 67 y.o. with a pertinent PMH of severe deconditioning/functional paraplegia and malnutrition, who is being treated for osteomyelitis of the right greater trochanter and aspiration pneumonia.   Overnight Events: None  Interim History: Patient was evaluated at bedside this morning.  He says he feels better than yesterday.  He is on 2 L nasal cannula, satting well with no worsening shortness of breath.  He denies abdominal pain and loose stool. No other new symptoms reported. Chest tube had 55cc output this morning.   OBJECTIVE:  Vital Signs: Vitals:   07/17/23 1511 07/18/23 0348 07/18/23 0351 07/18/23 0741  BP: 121/71 123/62  124/76  Pulse: 92 89  82  Resp: 16 15  17   Temp: 97.6 F (36.4 C) (!) 97.5 F (36.4 C)  (!) 97.5 F (36.4 C)  TempSrc: Oral Oral  Oral  SpO2: 98% 94%  97%  Weight:   87.1 kg   Height:       Supplemental O2: Nasal Cannula SpO2: 97 % O2 Flow Rate (L/min): 2 L/min  Filed Weights   06/29/23 2155 06/30/23 0502 07/18/23 0351  Weight: 83.5 kg 82.6 kg 87.1 kg    Intake/Output Summary (Last 24 hours) at 07/18/2023 1127 Last data filed at 07/18/2023 0559 Gross per 24 hour  Intake 2029 ml  Output 150 ml  Net 1879 ml   Net IO Since Admission: 174.85 mL [07/18/23 1127]  Physical Exam: General: Sitting up in bed, on 2 L nasal cannula with cortrak in place Pulm: Regular respiratory rate and respiratory effort, no accessory muscle use noted, lung sounds improved from yesterday, very minimal Cardio: Normal rate and rhythm this morning Mouth: Oral thrush present, slightly improved  Patient Lines/Drains/Airways Status     Active Line/Drains/Airways     Name Placement date Placement time Site Days   PICC Single Lumen 06/24/23 Left Basilic 44 cm 0 cm 06/24/23  4132  Basilic  19   Chest Tube 1 Lateral;Left Pleural 14 Fr. 07/06/23  1725  Pleural  7   External Urinary Catheter 06/30/23  0430  --  13    Pressure Injury 04/30/23 Hip Anterior;Left;Proximal Stage 2 -  Partial thickness loss of dermis presenting as a shallow open injury with a red, pink wound bed without slough. close to R hip 04/30/23  2200  -- 74   Pressure Injury 04/30/23 Hip Anterior;Proximal;Right Stage 2 -  Partial thickness loss of dermis presenting as a shallow open injury with a red, pink wound bed without slough. 1/2 cm by 0.8cm by 0.2 cm 04/30/23  2200  -- 74   Pressure Injury 04/30/23 Heel Right;Left Stage 1 -  Intact skin with non-blanchable redness of a localized area usually over a bony prominence. soft pressure on heels bilaterally foams applied 04/30/23  2200  -- 74   Pressure Injury 06/19/23 Buttocks Right;Upper Unstageable - Full thickness tissue loss in which the base of the injury is covered by slough (yellow, tan, gray, green or brown) and/or eschar (tan, brown or black) in the wound bed. 06/19/23  1408  -- 24   Pressure Injury 06/19/23 Buttocks Right Unstageable - Full thickness tissue loss in which the base of the injury is covered by slough (yellow, tan, gray, green or brown) and/or eschar (tan, brown or black) in the wound bed. 06/19/23  1408  -- 24   Pressure Injury 06/19/23 Buttocks Right Stage 2 -  Partial thickness loss of dermis presenting as a  shallow open injury with a red, pink wound bed without slough. 06/19/23  1408  -- 24   Pressure Injury 06/19/23 Hip Left;Lateral Stage 2 -  Partial thickness loss of dermis presenting as a shallow open injury with a red, pink wound bed without slough. 06/19/23  1408  -- 24   Pressure Injury 06/19/23 Lumbar Lateral;Right Stage 2 -  Partial thickness loss of dermis presenting as a shallow open injury with a red, pink wound bed without slough. 06/19/23  1408  -- 24   Pressure Injury 06/19/23 Heel Left;Lateral Stage 4 - Full thickness tissue loss with exposed bone, tendon or muscle. 06/19/23  1408  -- 24   Pressure Injury 06/19/23 Foot Right;Lateral Unstageable - Full  thickness tissue loss in which the base of the injury is covered by slough (yellow, tan, gray, green or brown) and/or eschar (tan, brown or black) in the wound bed. 06/19/23  1408  -- 24   Pressure Injury 06/19/23 Heel Right;Lateral Unstageable - Full thickness tissue loss in which the base of the injury is covered by slough (yellow, tan, gray, green or brown) and/or eschar (tan, brown or black) in the wound bed. 06/19/23  1408  -- 24   Pressure Injury 06/19/23 Heel Left Unstageable - Full thickness tissue loss in which the base of the injury is covered by slough (yellow, tan, gray, green or brown) and/or eschar (tan, brown or black) in the wound bed. 06/19/23  1408  -- 24   Wound / Incision (Open or Dehisced) 08/02/20 Sacrum Medial 08/02/20  1458  Sacrum  1075   Wound / Incision (Open or Dehisced) 08/03/20 Hip Left 08/03/20  1513  Hip  1074            Pertinent Labs:    Latest Ref Rng & Units 07/18/2023    4:39 AM 07/17/2023    4:28 AM 07/16/2023    2:54 PM  CBC  WBC 4.0 - 10.5 K/uL 8.0  12.5  21.7   Hemoglobin 13.0 - 17.0 g/dL 7.9  8.1  9.0   Hematocrit 39.0 - 52.0 % 25.0  25.9  28.5   Platelets 150 - 400 K/uL 623  592  627        Latest Ref Rng & Units 07/17/2023    4:28 AM 07/15/2023    5:36 PM 07/13/2023    3:07 AM  CMP  Glucose 70 - 99 mg/dL 914   782   BUN 8 - 23 mg/dL 25   22   Creatinine 9.56 - 1.24 mg/dL 2.13   0.86   Sodium 578 - 145 mmol/L 135   137   Potassium 3.5 - 5.1 mmol/L 3.6  3.7  3.6   Chloride 98 - 111 mmol/L 103   105   CO2 22 - 32 mmol/L 24   26   Calcium 8.9 - 10.3 mg/dL 8.0   8.0   Total Protein 6.5 - 8.1 g/dL   5.1   Total Bilirubin 0.3 - 1.2 mg/dL   0.6   Alkaline Phos 38 - 126 U/L   52   AST 15 - 41 U/L   22   ALT 0 - 44 U/L   15     ASSESSMENT/PLAN:  Assessment: Principal Problem:   Infected decubitus ulcer Active Problems:   Diabetes mellitus (HCC)   Protein-calorie malnutrition, severe   Osteomyelitis of right femur (HCC)   Goals of care,  counseling/discussion   Osteomyelitis (HCC)   Aspiration pneumonia of  left lower lobe (HCC)   Parapneumonic effusion   Need for management of chest tube   Pleural effusion   Pneumonia of right lung due to infectious organism  Plan: Aspiration pneumonia Loculated left pleural effusion s/p chest tube Worsening pneumonia of the right lung Breathing this morning seems improved from yesterday, no longer tachypneic.  His heart rate is also improved, no longer tachycardic this morning.  55cc chest tube output in pleuro-vac at time of exam.  Still satting well on 2 L nasal cannula.  -Unasyn 3 g IV, Linezolid 600 mg q12h IV until 8/9 -F/u repeat pleural fluid cytology -PCCM following  Oral/Esophageal thrush Newly worsening dysphagia/odynophagia Silent aspiration After talking with the patient and his brother yesterday afternoon, he elected to proceed with the CorTrak.  It was placed yesterday and he was started on tube feeds.  He is tolerating it well this morning. Mag and Phos are WNL. Will continue to work with the dietitians to ensure he is receiving adequate nutrition through his tube feeds.  We hope that his pneumonia improves with the elimination of active aspiration. -Tube feeds; RD consulted, assistance appreciated -NPO  -Medications administered IV/IM given his intolerance of oral medications -Fluconazole 200 mg IV daily, ending on 8/9  Osteomyelitis of the right greater trochanter R hip decubitus ulcer No new changes/updates to management.  -Unasyn 3 g IV, Linezolid 600 mg q12h IV; discontinued ceftriaxone -IV Tylenol 1,000 mg q6h scheduled; IV dilaudid 0.5 mg q4h PRN -Frequent offloading and turning -Dressing changes 3 times daily per general surgery recommendations with dry kerlix gauze, medihoney or santyl  Severe deconditioning and weakness Severe malnutrition in the setting of acute illness We are increasingly concerned about his overall picture and poor prognosis given his  severe deconditioning, acute on chronic dysphagia/silent aspiration, worsening infections despite antibiotic use, and poor wound healing.  We will put in a palliative care consult as we believe it is a good time for him to reengage with their team about his goals for care moving forward.  Best Practice: Diet: NPO; Tube feeds IVF: None VTE: enoxaparin (LOVENOX) injection 40 mg Start: 07/05/23 2200 Code: DNR AB: Linezolid, Unasyn Therapy Recs: SNF, pending authorization Family Contact: brother, [name], to be notified. DISPO: Anticipated discharge to SNF pending medical stability  Signature: Annett Fabian, MD  Internal Medicine Resident, PGY-1 [facility]  Pager: [phone] 11:27 AM, 07/18/2023   Please contact the on call pager after 5 pm and on weekends at [phone].

## 2023-07-19 ENCOUNTER — Inpatient Hospital Stay (HOSPITAL_COMMUNITY): Payer: 59

## 2023-07-19 DIAGNOSIS — J869 Pyothorax without fistula: Secondary | ICD-10-CM

## 2023-07-19 LAB — MAGNESIUM: Magnesium: 1.7 mg/dL (ref 1.7–2.4)

## 2023-07-19 LAB — GLUCOSE, CAPILLARY
Glucose-Capillary: 157 mg/dL — ABNORMAL HIGH (ref 70–99)
Glucose-Capillary: 182 mg/dL — ABNORMAL HIGH (ref 70–99)
Glucose-Capillary: 188 mg/dL — ABNORMAL HIGH (ref 70–99)
Glucose-Capillary: 194 mg/dL — ABNORMAL HIGH (ref 70–99)
Glucose-Capillary: 199 mg/dL — ABNORMAL HIGH (ref 70–99)

## 2023-07-19 LAB — PHOSPHORUS: Phosphorus: 2.9 mg/dL (ref 2.5–4.6)

## 2023-07-19 MED ORDER — ACETAMINOPHEN 160 MG/5ML PO SOLN
650.0000 mg | Freq: Four times a day (QID) | ORAL | Status: DC
  Administered 2023-07-19 – 2023-07-27 (×18): 650 mg
  Filled 2023-07-19 (×21): qty 20.3

## 2023-07-19 MED ORDER — METHOCARBAMOL 500 MG PO TABS
500.0000 mg | ORAL_TABLET | Freq: Four times a day (QID) | ORAL | Status: DC
  Administered 2023-07-19 – 2023-07-27 (×32): 500 mg
  Filled 2023-07-19 (×33): qty 1

## 2023-07-19 MED ORDER — ACETAMINOPHEN 325 MG PO TABS: 650.0000 mg | ORAL_TABLET | Freq: Four times a day (QID) | ORAL | Status: DC

## 2023-07-19 MED ORDER — BUSPIRONE HCL 5 MG PO TABS
5.0000 mg | ORAL_TABLET | Freq: Three times a day (TID) | ORAL | Status: DC
  Administered 2023-07-19 – 2023-07-27 (×25): 5 mg
  Filled 2023-07-19 (×25): qty 1

## 2023-07-19 NOTE — Plan of Care (Signed)

## 2023-07-19 NOTE — Progress Notes (Signed)
HD#19 SUBJECTIVE:  Patient Summary: [name] is a 67 y.o. with a pertinent PMH of severe deconditioning/functional paraplegia and malnutrition, who is being treated for osteomyelitis of the right greater trochanter and aspiration pneumonia.   Overnight Events: None  Interim History: Patient was evaluated at bedside this morning.  He reports that his shortness of breath and hip pain are unchanged from yesterday.  His muscle spasms are little worse.  He says his throat feels better but it still hurts when he swallows.  He still on 3 L nasal cannula, satting appropriately.  OBJECTIVE:  Vital Signs: Vitals:   07/18/23 2133 07/19/23 0500 07/19/23 0532 07/19/23 0752  BP: 124/71  113/72   Pulse: 84  81 82  Resp:   18 19  Temp: 98 F (36.7 C)  97.6 F (36.4 C)   TempSrc: Oral  Oral   SpO2: 98%  97% 96%  Weight:  89 kg    Height:       Supplemental O2: Nasal Cannula SpO2: 96 % O2 Flow Rate (L/min): 3 L/min  Filed Weights   06/30/23 0502 07/18/23 0351 07/19/23 0500  Weight: 82.6 kg 87.1 kg 89 kg    Intake/Output Summary (Last 24 hours) at 07/19/2023 1037 Last data filed at 07/19/2023 0600 Gross per 24 hour  Intake 1783.5 ml  Output 75 ml  Net 1708.5 ml   Net IO Since Admission: 1,883.35 mL [07/19/23 1037]  Physical Exam: General: Lying in bed on 3 L nasal cannula, cortrak present Pulm: Normal respiratory rate, no accessory muscle use, normal work of breathing lungs CTA bilaterally but exam limited by patient immobility; chest tube in place on the left, entry site looks clean Cardio: Normal rate and rhythm this morning, no edema Mouth: Oral thrush present, improved from yesterday  Patient Lines/Drains/Airways Status     Active Line/Drains/Airways     Name Placement date Placement time Site Days   PICC Single Lumen 06/24/23 Left Basilic 44 cm 0 cm 06/24/23  4098  Basilic  19   Chest Tube 1 Lateral;Left Pleural 14 Fr. 07/06/23  1725  Pleural  7   External Urinary  Catheter 06/30/23  0430  --  13   Pressure Injury 04/30/23 Hip Anterior;Left;Proximal Stage 2 -  Partial thickness loss of dermis presenting as a shallow open injury with a red, pink wound bed without slough. close to R hip 04/30/23  2200  -- 74   Pressure Injury 04/30/23 Hip Anterior;Proximal;Right Stage 2 -  Partial thickness loss of dermis presenting as a shallow open injury with a red, pink wound bed without slough. 1/2 cm by 0.8cm by 0.2 cm 04/30/23  2200  -- 74   Pressure Injury 04/30/23 Heel Right;Left Stage 1 -  Intact skin with non-blanchable redness of a localized area usually over a bony prominence. soft pressure on heels bilaterally foams applied 04/30/23  2200  -- 74   Pressure Injury 06/19/23 Buttocks Right;Upper Unstageable - Full thickness tissue loss in which the base of the injury is covered by slough (yellow, tan, gray, green or brown) and/or eschar (tan, brown or black) in the wound bed. 06/19/23  1408  -- 24   Pressure Injury 06/19/23 Buttocks Right Unstageable - Full thickness tissue loss in which the base of the injury is covered by slough (yellow, tan, gray, green or brown) and/or eschar (tan, brown or black) in the wound bed. 06/19/23  1408  -- 24   Pressure Injury 06/19/23 Buttocks Right Stage 2 -  Partial thickness loss of dermis presenting as a shallow open injury with a red, pink wound bed without slough. 06/19/23  1408  -- 24   Pressure Injury 06/19/23 Hip Left;Lateral Stage 2 -  Partial thickness loss of dermis presenting as a shallow open injury with a red, pink wound bed without slough. 06/19/23  1408  -- 24   Pressure Injury 06/19/23 Lumbar Lateral;Right Stage 2 -  Partial thickness loss of dermis presenting as a shallow open injury with a red, pink wound bed without slough. 06/19/23  1408  -- 24   Pressure Injury 06/19/23 Heel Left;Lateral Stage 4 - Full thickness tissue loss with exposed bone, tendon or muscle. 06/19/23  1408  -- 24   Pressure Injury 06/19/23 Foot  Right;Lateral Unstageable - Full thickness tissue loss in which the base of the injury is covered by slough (yellow, tan, gray, green or brown) and/or eschar (tan, brown or black) in the wound bed. 06/19/23  1408  -- 24   Pressure Injury 06/19/23 Heel Right;Lateral Unstageable - Full thickness tissue loss in which the base of the injury is covered by slough (yellow, tan, gray, green or brown) and/or eschar (tan, brown or black) in the wound bed. 06/19/23  1408  -- 24   Pressure Injury 06/19/23 Heel Left Unstageable - Full thickness tissue loss in which the base of the injury is covered by slough (yellow, tan, gray, green or brown) and/or eschar (tan, brown or black) in the wound bed. 06/19/23  1408  -- 24   Wound / Incision (Open or Dehisced) 08/02/20 Sacrum Medial 08/02/20  1458  Sacrum  1075   Wound / Incision (Open or Dehisced) 08/03/20 Hip Left 08/03/20  1513  Hip  1074            Pertinent Labs:    Latest Ref Rng & Units 07/18/2023    4:39 AM 07/17/2023    4:28 AM 07/16/2023    2:54 PM  CBC  WBC 4.0 - 10.5 K/uL 8.0  12.5  21.7   Hemoglobin 13.0 - 17.0 g/dL 7.9  8.1  9.0   Hematocrit 39.0 - 52.0 % 25.0  25.9  28.5   Platelets 150 - 400 K/uL 623  592  627        Latest Ref Rng & Units 07/17/2023    4:28 AM 07/15/2023    5:36 PM 07/13/2023    3:07 AM  CMP  Glucose 70 - 99 mg/dL 621   308   BUN 8 - 23 mg/dL 25   22   Creatinine 6.57 - 1.24 mg/dL 8.46   9.62   Sodium 952 - 145 mmol/L 135   137   Potassium 3.5 - 5.1 mmol/L 3.6  3.7  3.6   Chloride 98 - 111 mmol/L 103   105   CO2 22 - 32 mmol/L 24   26   Calcium 8.9 - 10.3 mg/dL 8.0   8.0   Total Protein 6.5 - 8.1 g/dL   5.1   Total Bilirubin 0.3 - 1.2 mg/dL   0.6   Alkaline Phos 38 - 126 U/L   52   AST 15 - 41 U/L   22   ALT 0 - 44 U/L   15    Phos: 2.9 Mag: 1.7  ASSESSMENT/PLAN:  Assessment: Principal Problem:   Infected decubitus ulcer Active Problems:   Diabetes mellitus (HCC)   Protein-calorie malnutrition, severe    Osteomyelitis of right femur (HCC)   Goals  of care, counseling/discussion   Osteomyelitis (HCC)   Aspiration pneumonia of left lower lobe (HCC)   Parapneumonic effusion   Need for management of chest tube   Pleural effusion   Pneumonia of right lung due to infectious organism  Plan: Aspiration pneumonia Loculated left pleural effusion s/p chest tube Worsening pneumonia of the right lung He is breathing comfortably this morning on 3 L nasal cannula.  Not tachypneic or tachycardic.  Remains afebrile.  Chest tube drained 75 cc fluid overnight.  Once his chest tube is removed by PCCM, we will reengage infectious diseases about possibly narrowing his antibiotics and returning to his prior regimen. - Continue Unasyn 3 g IV, Linezolid 600 mg q12h IV until 8/9 - Repeat pleural fluid cytology still pending - PCCM following  Oral/Esophageal thrush Acute on chronic dysphagia Silent aspiration His oral thrush is somewhat improved this morning, as well as his odynophagia.  He is receiving tube feeds through a CorTrak. Mag and Phos remain WNL.  No new changes in management. -Tube feeds; RD consulted, assistance appreciated -NPO  -Medications administered IV/IM given his intolerance of oral medications -Fluconazole 200 mg IV daily, ending on 8/9  Osteomyelitis of the right greater trochanter R hip decubitus ulcer No new changes/updates to management.  -Continue Unasyn 3 g IV, Linezolid 600 mg q12h IV -Tylenol per tube q6h scheduled; IV dilaudid 0.5 mg q4h PRN -Frequent offloading and turning -Dressing changes 3 times daily per general surgery recommendations with dry kerlix gauze, medihoney or santyl  Severe deconditioning and weakness Severe malnutrition in the setting of acute illness We are increasingly concerned about his overall picture and poor prognosis given his severe deconditioning, acute on chronic dysphagia/silent aspiration, worsening infections despite antibiotic use, and poor  wound healing.  We will continue to engage with him and his brother about goals of care discussions moving forward.    Best Practice: Diet: NPO; Tube feeds IVF: None VTE: enoxaparin (LOVENOX) injection 40 mg Start: 07/05/23 2200 Code: DNR AB: Linezolid, Unasyn Therapy Recs: SNF, pending authorization Family Contact: brother, [name], to be notified. DISPO: Anticipated discharge to SNF pending medical stability  Signature: Annett Fabian, MD  Internal Medicine Resident, PGY-1 [facility]  Pager: [phone] 10:37 AM, 07/19/2023   Please contact the on call pager after 5 pm and on weekends at [phone].

## 2023-07-19 NOTE — Progress Notes (Signed)
   NAME:  [name], MRN:  [id number], DOB:  10-22-1956, LOS: 19 ADMISSION DATE:  06/29/2023, CONSULTATION DATE:  07/06/2023 REFERRING MD:  Dr. Toula Moos - IMTS, CHIEF COMPLAINT:  Pleural effusion    History of Present Illness:  [name] is a 67 y.o. male with a PMH significant for HTN, HLD, diabetes and failure to thrive who presented to the ED 06/29/2023 for request of placement assistance. During admission patient has been diagnosed with aspiration PNA with osteomyelitis of right greater trochanter and severe deconditioning.   AM 7/15 patient was complaining of worsening dyspnea with elevated D-dimer therefore CTA chest was obtained that again showed unchanged loculated left pleural effusion. PCCM consulted for assistance in management and possible need for chest tube.  Pertinent  Medical History   HTN, HLD, diabetes and failure to thrive   Significant Hospital Events: Including procedures, antibiotic start and stop dates in addition to other pertinent events   7/8 presented for assistance in placement  7/15 PCCM consulted, CTA chest repeated with  moderate left loculated pleural effusion  07/06/2023 left chest tube placed cyto and usual pleural labs sent -- exudative.  07/09/2023 left chest tube clamped and repeat cytology sent. 7/22 400 cc last 18 hours 7/26 >400 cc last 24 hr. EN started via cortrak. Cyto sent again 7/27 about 400cc the last 24 hr   Interim History / Subjective:   Denies chest pain or dyspnea Afebrile On 3 L nasal cannula  Objective   Blood pressure 113/72, pulse 82, temperature 97.6 F (36.4 C), temperature source Oral, resp. rate 19, height 5\' 10"  (1.778 m), weight 89 kg, SpO2 96%.        Intake/Output Summary (Last 24 hours) at 07/19/2023 1317 Last data filed at 07/19/2023 1100 Gross per 24 hour  Intake 1773.5 ml  Output 725 ml  Net 1048.5 ml   Filed Weights   06/30/23 0502 07/18/23 0351 07/19/23 0500  Weight: 82.6 kg 87.1 kg 89 kg     Examination: General: chronically ill debilitated, cachectic appearing M NAD  Neuro: Awake, interactive, paraplegic HEENT: pink mm Pulm: No chest wall crepitus, decreased breath sounds on left, no accessory muscle use CV: S1-S2 regular Abd: round  Labs 7/27 no leukocytosis, stable anemia Chest x-ray showed small left pneumothorax, improved 7/28, no effusion  Resolved Hospital Problem list     Assessment & Plan:   Transudative left pleural effusion, lymphocytic, doubt parapneumonic  -chest tube placed 7/15  Cyto 7/15, 7/18 with no malignant cells. It looks like primary has sent repeat 7/26  P: -Effusion is resolved on chest x-ray, some drainage persists but transudative now and will DC pigtail, pneumothorax is minimal -cyto was sent again 7/26 per primary, follow    Acute respiratory failure with hypoxia Bilateral PNA, aspiration  -unasyn, zyvox, fluc per ID   -NPO, EN via cortrak   Best Practice (right click and "Reselect all SmartList Selections" daily)  Per primary   Signature:   Cyril Mourning MD. Tonny Bollman. [facility] Pager : [phone]  If no response to pager , please call [phone] until 7 pm After 7:00 pm call Elink  [phone]    07/19/2023, 1:17 PM

## 2023-07-20 LAB — GLUCOSE, CAPILLARY
Glucose-Capillary: 194 mg/dL — ABNORMAL HIGH (ref 70–99)
Glucose-Capillary: 216 mg/dL — ABNORMAL HIGH (ref 70–99)
Glucose-Capillary: 227 mg/dL — ABNORMAL HIGH (ref 70–99)
Glucose-Capillary: 207 mg/dL — ABNORMAL HIGH (ref 70–99)
Glucose-Capillary: 163 mg/dL — ABNORMAL HIGH (ref 70–99)
Glucose-Capillary: 184 mg/dL — ABNORMAL HIGH (ref 70–99)

## 2023-07-20 MED ORDER — BANATROL TF EN LIQD
60.0000 mL | Freq: Three times a day (TID) | ENTERAL | Status: DC
  Administered 2023-07-20 – 2023-07-27 (×21): 60 mL
  Filled 2023-07-20 (×21): qty 60

## 2023-07-20 NOTE — Progress Notes (Signed)
   07/20/23 1139  Spiritual Encounters  Type of Visit Initial  Care provided to: Patient  Referral source Nurse (RN/NT/LPN)  Reason for visit Routine spiritual support  OnCall Visit No  Spiritual Framework  Presenting Themes Values and beliefs;Significant life change  Values/beliefs patient is [facility] and asked for prayer for healing  Community/Connection Family  Patient Stress Factors Lack of caregivers;Health changes  Family Stress Factors Health changes  Interventions  Spiritual Care Interventions Made Established relationship of care and support;Compassionate presence;Reflective listening;Explored values/beliefs/practices/strengths;Prayer;Encouragement  Intervention Outcomes  Outcomes Connection to spiritual care;Awareness of health;Awareness of support;Reduced isolation  Spiritual Care Plan  Spiritual Care Issues Still Outstanding Chaplain will continue to follow  Follow up plan  Chaplain Jorden Mahl and Gasper Lloyd will follow.   Chaplain received a referral from medical staff. Patient had been hospitalized for 20 days. Patient has a lack of caregivers at home and prefers to be cared for in the hospital. Patient's main concern was pain. When asked about his emotional state patient expressed his pain "gets to him." He agreed that it gets him down. Patient had the room very dark and when asked about it he stated he was trying to get some rest. Patient asked chaplain to pray for him. He specifically asked for prayer for healing. Patient was encourage when asked if he would like another visit. Chaplain will continue to follow and will also refer care to the other chaplain assigned to 6N. Chaplain will also check in with palliative chaplain  to coordinate follow up visits.   Arlyce Dice, Chaplain Resident [phone]

## 2023-07-20 NOTE — Progress Notes (Addendum)
Subjective:   Summary: [name] is a 67 y.o. year old male with history significant for severe deconditioning/functional paraplegia and malnutrition, currently admitted on the IMTS HD#20 for osteomyelitis of the right greater trochanter and aspiration pneumonia.  Overnight Events: None   Saw at the bedside this morning. Reports continued pain of his right hip. No new shortness of breath. Continued discomfort with swallowing. Still on 3L [place].   Objective:  Vital signs in last 24 hours: Vitals:   07/19/23 1517 07/19/23 2021 07/20/23 0452 07/20/23 0804  BP: 127/60 (!) 143/62 (!) 141/66 (!) 153/85  Pulse: 85 86 85 89  Resp: 15 20 20 17   Temp: 97.8 F (36.6 C) 98.1 F (36.7 C) (!) 97.5 F (36.4 C) 97.8 F (36.6 C)  TempSrc: Oral Oral Oral Oral  SpO2: 100% 98% 96% 95%  Weight:      Height:       Supplemental O2: Nasal Cannula SpO2: 95 % O2 Flow Rate (L/min): 3 L/min   Physical Exam:  Constitutional: chronically sick-appearing, laying in bed, in no acute distress Cardiovascular: RRR, no murmurs, rubs or gallops Pulmonary/Chest: Normal respiratory rate, no accessory muscle use. CTA bilaterally but exam limited by patient immobility Abdominal: soft, non-tender, non-distended Skin: warm and dry Mouth: Oral thrush present  Filed Weights   06/30/23 0502 07/18/23 0351 07/19/23 0500  Weight: 82.6 kg 87.1 kg 89 kg     Intake/Output Summary (Last 24 hours) at 07/20/2023 1210 Last data filed at 07/20/2023 0649 Gross per 24 hour  Intake --  Output 500 ml  Net -500 ml   Net IO Since Admission: 733.35 mL [07/20/23 1210]  Pertinent Labs:    Latest Ref Rng & Units 07/20/2023    1:52 AM 07/18/2023    4:39 AM 07/17/2023    4:28 AM  CBC  WBC 4.0 - 10.5 K/uL 9.7  8.0  12.5   Hemoglobin 13.0 - 17.0 g/dL 7.8  7.9  8.1   Hematocrit 39.0 - 52.0 % 25.3  25.0  25.9   Platelets 150 - 400 K/uL 668  623  592        Latest Ref Rng & Units 07/20/2023    1:52 AM  07/17/2023    4:28 AM 07/15/2023    5:36 PM  CMP  Glucose 70 - 99 mg/dL 962  952    BUN 8 - 23 mg/dL 19  25    Creatinine 8.41 - 1.24 mg/dL 3.24  4.01    Sodium 027 - 145 mmol/L 136  135    Potassium 3.5 - 5.1 mmol/L 3.8  3.6  3.7   Chloride 98 - 111 mmol/L 104  103    CO2 22 - 32 mmol/L 27  24    Calcium 8.9 - 10.3 mg/dL 7.9  8.0    Total Protein 6.5 - 8.1 g/dL 4.9     Total Bilirubin 0.3 - 1.2 mg/dL 0.4     Alkaline Phos 38 - 126 U/L 71     AST 15 - 41 U/L 31     ALT 0 - 44 U/L 23       Assessment/Plan:   Principal Problem:   Infected decubitus ulcer Active Problems:   Diabetes mellitus (HCC)   Protein-calorie malnutrition, severe   Osteomyelitis of right femur (HCC)   Goals of care, counseling/discussion   Osteomyelitis (HCC)   Aspiration pneumonia of left  lower lobe (HCC)   Parapneumonic effusion   Need for management of chest tube   Pleural effusion   Pneumonia of right lung due to infectious organism   [facility])   Patient Summary: [name] is a 67 y.o. with a pertinent PMH of functional paraplegia, who presented with infected decubitus ulcer/osteomyelitis and admitted for osteomyelitis and aspiration pneumonia.    #Aspiration Pneumonia #Loculated left pleural effusion s/p chest tube #Worsening pneumonia of right lung He is breathing comfortably on 3L [place]. Chest tube has been removed by PCCM, will reengage infectious disease about possibly narrowing antibiotics -Continue Unsayn 3g IV, Linezolid 600mg  q12h IV until 8/9 -Repeat pleural fluid cytology still pending -PCCM following  #Oral/Esophageal Thrush #Acute on chronic dysphagia Somewhat improved oral thrush and dysphagia. Receiving tube feeds through a [facility]. Mag and Phos remain WNL. No new changes in management.  -Tube feeds; RD consulted, state pt is refusing swallowing therapy. Appreciate assistance -NPO -Medications administered IV/IM given his intolerance of oral medications -Fluconazole  200mg  IV daily, ending 8/9  #Osteomyelitis of the right greater trochanter #R hip decubitus ulcer No new changes/updates to management -Continue Unasyn 3g IV, Linezolid 600mg  q12 IV -Tylenol per tube q6h scheduled, IV dilaudid 0.5mg  q4h PRN -Frequent offloading and turning -Dressing changes 3 times daily per general surgery recommendations with dry kerlix gauze, medihoney or santyl  #Severe deconditioning and weakness #Severe malnutrition in setting of acute illness We are increasingly concerned about his overall picture and poor prognosis given his secure deconditioning, acute on chronic dysphagia, worsening infections despite antibiotic use, poor wound healing, and refusal to participate in therapies. We will continue to engage with him and his brother about goals of care discussions moving forward.    Diet: NPO, Tube feeds IVF: None,None VTE: Enoxaparin Code: DNR PT/OT recs: SNF for Subacute PT,  Family Update: brother [name], to be notified   Dispo: Anticipated discharge to Skilled nursing facility pending medical stability.   Monna Fam , MD PGY-1 Internal Medicine Resident Pager Number [phone] Please contact the on call pager after 5 pm and on weekends at [phone].   Internal Medicine Attending:  I was physically present during the critical or key portions of the resident provided service and participated in formulating the plan of care and medical decision making for the patient as documented in the resident's note.   Gust Rung, DO

## 2023-07-20 NOTE — Progress Notes (Addendum)
Nutrition Follow-up  DOCUMENTATION CODES:   Severe malnutrition in context of acute illness/injury  INTERVENTION:  Continue Pivot 1.5 @ goal rate of 70 mL/hr.  - This will provide (1680 mL) 2520 kcals, 157 gm protein, and 1262 mL free water.  - FWF was discontinued  - Add Banatrol TID.   NUTRITION DIAGNOSIS:   Severe Malnutrition related to acute illness as evidenced by severe fat depletion, severe muscle depletion, energy intake < 75% for > 7 days.  GOAL:   Patient will meet greater than or equal to 90% of their needs - Met with TF.   MONITOR:   PO intake, Supplement acceptance  REASON FOR ASSESSMENT:   Malnutrition Screening Tool    ASSESSMENT:   67 y.o. male admits related to infected decubitus ulcer. PMH includes: HTN, DM, HLD, paraplegia, severe deconditioning. Pt is currently receiving medical management related to infected decubitus ulcer and aspiration pneumonia.  Meds reviewed: Vit C, cozaar, MVI, senokot, thiamine, zinc sulfate. Labs reviewed: Creatinine low.   Pt was recommended to be NPO by SLP starting on 7/26. Cortrak tube was then placed that afternoon to provide pt with nutrition support to meet his needs. Pt is currently on 3L nasal cannula. Cortrak is in place and tube feeds are infusing as ordered. RN reports that the pt is having massive diarrhea. RD will add Banatrol TID for now and continue to closely monitor TF tolerance.   Diet Order:   Diet Order             Diet NPO time specified  Diet effective now                   EDUCATION NEEDS:   Not appropriate for education at this time  Skin:  Skin Assessment: Skin Integrity Issues: Skin Integrity Issues:: Stage IV Stage II: R buttocks; L Hip; R lateral lumbar Stage IV: L lateral heel Unstageable: R upper buttocks; R lateral foot; R lateral heel; L heel  Last BM:  7/28  Height:   Ht Readings from Last 1 Encounters:  06/30/23 5\' 10"  (1.778 m)    Weight:   Wt Readings from Last  1 Encounters:  07/19/23 89 kg    Ideal Body Weight:     BMI:  Body mass index is 28.15 kg/m.  Estimated Nutritional Needs:   Kcal:  4098-1191 kcals  Protein:  125-145 gm  Fluid:  >/= 2 L/day  Bethann Humble, RD, LDN, CNSC.

## 2023-07-20 NOTE — Progress Notes (Signed)
Physical Therapy Treatment Patient Details Name: [name] MRN: [id number] DOB: Dec 28, 1955 Today's Date: 07/20/2023   History of Present Illness 67 yo male admitted on 7/9 with decubitus ulcer; pt and brother unable to manage at home; recent admission, dc'd 7/8, returned on 7/9; Hydrotherapy initiated; 7/15 difficulty breathing with pleural effusion, L chest tube placed; with PMH of hypertension, gastric ulcers, severe deconditioning, left femoral fracture s/p IMN, previously admitted and treated for right hip osteomyelitis. Discharged with home health    PT Comments  Continuing work on functional mobility and activity tolerance;  Pt declining mobility training and /or therex; Just wanting assistance in re-positioning; Noted his bed was wet with urine upon repositioning, and opted to assist pt with cleaning up and positioning; 2 person total assist for rolling and positioning; this is notably different than original PT eval (done by this therapist) on 7/9;   Agree with re-consult of Palliative Care Team   If plan is discharge home, recommend the following: Assist for transportation;Help with stairs or ramp for entrance;Other (comment);Two people to help with walking and/or transfers;A lot of help with walking and/or transfers   Can travel by private vehicle     No  Equipment Recommendations  Other (comment) (Per accepting facility)    Recommendations for Other Services       Precautions / Restrictions Precautions Precautions: Fall Precaution Comments: fell in 03/2023 with L femur fx (missed w/c during transfer), 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) Restrictions Weight Bearing Restrictions: No     Mobility  Bed Mobility Overal bed mobility: Needs Assistance Bed Mobility: Rolling Rolling: Total assist, +2 for physical assistance, +2 for safety/equipment         General bed mobility comments: Assisted pt with hygeine when noted he was wet with urine; Pt reached for  rails with heavy cueing, and was able to pull to reposition himself a bit with encourgaement and cueing; Ultimately, he needed total assist for rolling; ended session with pt on his L side    Transfers                        Ambulation/Gait                   Stairs             Wheelchair Mobility     Tilt Bed    Modified Rankin (Stroke Patients Only)       Balance                                            Cognition Arousal/Alertness: Awake/alert Behavior During Therapy: WFL for tasks assessed/performed Overall Cognitive Status: Within Functional Limits for tasks assessed                                 General Comments: Generally uncomfortable, and asked that we hld off on mobiltiy and exercise today        Exercises      General Comments General comments (skin integrity, edema, etc.): on supplemental O2      Pertinent Vitals/Pain Pain Assessment Pain Assessment: Faces Faces Pain Scale: Hurts whole lot Pain Location: buttocks Pain Descriptors / Indicators: Discomfort Pain Intervention(s): Monitored during session, Repositioned    Home Living  Prior Function            PT Goals (current goals can now be found in the care plan section) Acute Rehab PT Goals Patient Stated Goal: get in a more comfortable position PT Goal Formulation: With patient Time For Goal Achievement: 07/14/23 Potential to Achieve Goals: Poor Progress towards PT goals: Not progressing toward goals - comment (limited participation recently)    Frequency    Min 2X/week      PT Plan Current plan remains appropriate    Co-evaluation              AM-PAC PT "6 Clicks" Mobility   Outcome Measure  Help needed turning from your back to your side while in a flat bed without using bedrails?: Total Help needed moving from lying on your back to sitting on the side of a flat bed without  using bedrails?: Total Help needed moving to and from a bed to a chair (including a wheelchair)?: Total Help needed standing up from a chair using your arms (e.g., wheelchair or bedside chair)?: Total Help needed to walk in hospital room?: Total Help needed climbing 3-5 steps with a railing? : Total 6 Click Score: 6    End of Session Equipment Utilized During Treatment: Oxygen (bed pads) Activity Tolerance: Patient limited by pain Patient left: in bed;with call bell/phone within reach;with family/visitor present Nurse Communication: Mobility status;Other (comment) (need to check Pure[place]) PT Visit Diagnosis: Muscle weakness (generalized) (M62.81);History of falling (Z91.81);Difficulty in walking, not elsewhere classified (R26.2)     Time: 8119-1478 PT Time Calculation (min) (ACUTE ONLY): 20 min  Charges:    $Therapeutic Activity: 8-22 mins PT General Charges $$ ACUTE PT VISIT: 1 Visit                     Van Clines, PT  Acute Rehabilitation Services Office [phone] Secure Chat welcomed    Levi Aland 07/20/2023, 2:54 PM

## 2023-07-20 NOTE — Progress Notes (Signed)
SLP Cancellation Note  Patient Details Name: [name] MRN: [id number] DOB: 05-15-1956   Cancelled treatment:        Attempted to see pt for swallowing therapy.  Pt refused.  He denies swallowing difficulty, states he does not need swallowing therapy.  Discussed AMN of nutrition with pt who now has cortrak, provided information that this is temporary and that we hope his swallow function will improve so that he does not required long term AMN.  He says he doesn't "want to think about all that."  Pt's prognosis for improvement in swallow function is limited by the structural components of his dysphagia, and pt is no refusing trial swallowing therapy.  Additionally, chest imaging from yesterday after pt was made NPO on 7/26 shows worsening pneumonia, so AMN may not eliminate risk of aspiration for this patient. ST continues to recommend re-consult of palliative medicine to help manage his complex issues related to swallowing and nutrition.   Kerrie Pleasure, MA, CCC-SLP Acute Rehabilitation Services Office: [phone] 07/20/2023, 12:07 PM

## 2023-07-21 DIAGNOSIS — B370 Candidal stomatitis: Secondary | ICD-10-CM

## 2023-07-21 LAB — GLUCOSE, CAPILLARY
Glucose-Capillary: 174 mg/dL — ABNORMAL HIGH (ref 70–99)
Glucose-Capillary: 182 mg/dL — ABNORMAL HIGH (ref 70–99)
Glucose-Capillary: 196 mg/dL — ABNORMAL HIGH (ref 70–99)
Glucose-Capillary: 215 mg/dL — ABNORMAL HIGH (ref 70–99)
Glucose-Capillary: 221 mg/dL — ABNORMAL HIGH (ref 70–99)
Glucose-Capillary: 244 mg/dL — ABNORMAL HIGH (ref 70–99)

## 2023-07-21 MED ORDER — ADULT MULTIVITAMIN W/MINERALS CH
1.0000 | ORAL_TABLET | Freq: Every day | ORAL | Status: DC
  Administered 2023-07-22 – 2023-07-27 (×6): 1
  Filled 2023-07-21 (×6): qty 1

## 2023-07-21 MED ORDER — LOSARTAN POTASSIUM 25 MG PO TABS
25.0000 mg | ORAL_TABLET | Freq: Every day | ORAL | Status: DC
  Administered 2023-07-22 – 2023-07-27 (×6): 25 mg
  Filled 2023-07-21 (×6): qty 1

## 2023-07-21 MED ORDER — SENNOSIDES-DOCUSATE SODIUM 8.6-50 MG PO TABS
1.0000 | ORAL_TABLET | Freq: Every day | ORAL | Status: DC
  Administered 2023-07-21 – 2023-07-22 (×2): 1
  Filled 2023-07-21 (×4): qty 1

## 2023-07-21 MED ORDER — INSULIN ASPART 100 UNIT/ML IJ SOLN: 0.0000 [IU] | Freq: Every day | INTRAMUSCULAR | Status: DC

## 2023-07-21 MED ORDER — VITAMIN C 500 MG PO TABS
500.0000 mg | ORAL_TABLET | Freq: Every day | ORAL | Status: DC
  Administered 2023-07-22 – 2023-07-27 (×6): 500 mg
  Filled 2023-07-21 (×6): qty 1

## 2023-07-21 MED ORDER — GABAPENTIN 400 MG PO CAPS
800.0000 mg | ORAL_CAPSULE | Freq: Three times a day (TID) | ORAL | Status: DC
  Administered 2023-07-21 – 2023-07-27 (×15): 800 mg
  Filled 2023-07-21 (×17): qty 2

## 2023-07-21 MED ORDER — INSULIN ASPART 100 UNIT/ML IJ SOLN
0.0000 [IU] | Freq: Three times a day (TID) | INTRAMUSCULAR | Status: DC
  Administered 2023-07-21: 3 [IU] via SUBCUTANEOUS

## 2023-07-21 NOTE — Inpatient Diabetes Management (Signed)
Inpatient Diabetes Program Recommendations  AACE/ADA: New Consensus Statement on Inpatient Glycemic Control (2015)  Target Ranges:  Prepandial:   less than 140 mg/dL      Peak postprandial:   less than 180 mg/dL (1-2 hours)      Critically ill patients:  140 - 180 mg/dL   Lab Results  Component Value Date   GLUCAP 215 (H) 07/21/2023   HGBA1C 5.7 (H) 03/27/2023    Review of Glycemic Control  Diabetes history: DM2 Outpatient Diabetes medications: None Current orders for Inpatient glycemic control: None  HgbA1C - 5.7% on 03/27/23  Inpatient Diabetes Program Recommendations:    Add Novolog 0-9 units Q4H  Thank you. Ailene Ards, RD, LDN, CDCES Inpatient Diabetes Coordinator [phone]

## 2023-07-21 NOTE — Progress Notes (Addendum)
I have seen and examined the patient. I have personally reviewed the clinical findings, laboratory findings, microbiological data and imaging studies. The assessment and treatment plan was discussed with the Nurse Practitioner [name]. I agree with her/his recommendations except following additions/corrections.  Afebrile  On [place] NPO for chronic aspiration  No new cultures   Exam: chronically ill appearing male              Oral thrush improving              CVS- RRR             Chest - Normal respiratory effort              Abdomen - soft and non tender   # Aspiration PNA w associated effusion s/p CT placement. Considered Transudative by Pulmonary and CT removed .( Pleural fluid w WBC 5,250, lymphocytic, Cx NG, glucose 134, negative malignancy cells).  # Rt femur osteomyelitis/DU # Malnutrition/Deconditioning  # OP candidiasis  Comeplete 7 days of Linezolid and Unaysn to be followed by daptomycin and ceftriaxone thereafter to complete 6 weeks course for rt femur osteomyelitis. EOT 07/31/23 as planned previously  Complete 2 weeks course of PO fluconazole.  Continue wound care, offloading and optimize nutrition  Monitor CBC and CMP on abtx  ID available as needed, please recall with questions   I have personally spent 51 minutes involved in face-to-face and non-face-to-face activities for this patient on the day of the visit. Professional time spent includes the following activities: Preparing to see the patient (review of tests), Obtaining and/or reviewing separately obtained history (admission/discharge record), Performing a medically appropriate examination and/or evaluation , Ordering medications/tests/procedures, referring and communicating with other health care professionals, Documenting clinical information in the EMR, Independently interpreting results (not separately reported), Communicating results to the patient/family/caregiver, Counseling and educating the  patient/family/caregiver and Care coordination (not separately reported).   [facility]  Date of Admission:  06/29/2023     Total days of antibiotics 34         ASSESSMENT:  [name] chest tube has been removed and is breathing improved and remains on nasal cannula. Will continue current dose of linezolid and ampicillin-sulbactam for a total of 7 days and then return to previous regimen of daptomycin and ceftriaxone. Oral candidiasis is improving and tolerating fluconazole with no adverse side effects and continues to have difficulty with swallowing with ongoing concern for aspiration. Continue fluconazole through 8/9. Receiving tube feeds. Remaining medical and supportive care per Internal Medicine.   PLAN:  Continue current dose of linezolid and ampicillin-sulbactam.  Continue fluconazole through 8/9 for oral candidiasis. Wound care and optimize nutrition as able. Remaining medical and supportive care per Internal Medicine.   I have personally spent 25 minutes involved in face-to-face and non-face-to-face activities for this patient on the day of the visit. Professional time spent includes the following activities: Preparing to see the patient (review of tests), Obtaining and/or reviewing separately obtained history (admission/discharge record), Performing a medically appropriate examination and/or evaluation , Ordering medications/tests/procedures, referring and communicating with other health care professionals, Documenting clinical information in the EMR, Independently interpreting results (not separately reported), Communicating results to the patient/family/caregiver, Counseling and educating the patient/family/caregiver and Care coordination (not separately reported).    Principal Problem:   Infected decubitus ulcer Active Problems:   Diabetes mellitus (HCC)   Protein-calorie malnutrition, severe   Osteomyelitis of right femur (HCC)   Goals of care,  counseling/discussion   Osteomyelitis (HCC)   Aspiration pneumonia of left lower lobe (HCC)   Parapneumonic effusion   Need for management of chest tube   Pleural effusion   Pneumonia of right lung due to infectious organism   [place] (HCC)    acetaminophen (TYLENOL) oral liquid 160 mg/5 mL  650 mg Per Tube Q6H   [START ON 07/22/2023] ascorbic acid  500 mg Per Tube Daily   busPIRone  5 mg Per Tube TID   Chlorhexidine Gluconate Cloth  6 each Topical Q0600   collagenase   Topical Daily   enoxaparin (LOVENOX) injection  40 mg Subcutaneous Q24H   fiber supplement (BANATROL TF)  60 mL Per Tube TID   gabapentin  800 mg Per Tube TID   lidocaine  1 patch Transdermal Q24H   lidocaine (PF)  5 mL Intradermal Once   [START ON 07/22/2023] losartan  25 mg Per Tube Daily   methocarbamol  500 mg Per Tube QID   [START ON 07/22/2023] multivitamin with minerals  1 tablet Per Tube Daily   senna-docusate  1 tablet Per Tube QHS   sodium chloride flush  10-40 mL Intracatheter Q12H   thiamine  100 mg Per Tube Daily   zinc sulfate  220 mg Oral Daily    SUBJECTIVE:  Afebrile overnight with no acute events. Having left sided hip pain.   Allergies  Allergen Reactions   Lipitor [Atorvastatin Calcium] Other (See Comments)    Myalgias      Review of Systems: Review of Systems  Constitutional:  Negative for chills, fever and weight loss.  Respiratory:  Negative for cough, shortness of breath and wheezing.   Cardiovascular:  Negative for chest pain and leg swelling.  Gastrointestinal:  Negative for abdominal pain, constipation, diarrhea, nausea and vomiting.  Musculoskeletal:        Positive for left hip pain.   Skin:  Negative for rash.      OBJECTIVE: Vitals:   07/21/23 0343 07/21/23 0423 07/21/23 0740 07/21/23 0847  BP:  (!) 155/69 (!) 156/70 (!) 145/67  Pulse:  87 88 85  Resp:  20 20 18   Temp:  97.8 F (36.6 C) 97.6 F (36.4 C) 97.9 F (36.6 C)  TempSrc:  Oral Oral Oral  SpO2:  96%  96% 96%  Weight: 89 kg     Height:       Body mass index is 28.15 kg/m.  Physical Exam Constitutional:      General: He is not in acute distress.    Appearance: He is well-developed. He is ill-appearing.  HENT:     Nose:     Comments: C[place]rak present and infusing    Mouth/Throat:     Comments: Tongue with improving candidiasis.  Cardiovascular:     Rate and Rhythm: Normal rate and regular rhythm.     Heart sounds: Normal heart sounds.  Pulmonary:     Effort: Pulmonary effort is normal.     Breath sounds: Normal breath sounds.  Skin:    General: Skin is warm and dry.  Neurological:     Mental Status: He is alert and oriented to person, place, and time.  Psychiatric:        Behavior: Behavior normal.        Thought Content: Thought content normal.        Judgment: Judgment normal.     Lab Results Lab Results  Component Value Date   WBC 11.5 (H) 07/21/2023   HGB 8.0 (L)  07/21/2023   HCT 25.1 (L) 07/21/2023   MCV 88.1 07/21/2023   PLT 666 (H) 07/21/2023    Lab Results  Component Value Date   CREATININE 0.45 (L) 07/21/2023   BUN 17 07/21/2023   NA 135 07/21/2023   K 3.8 07/21/2023   CL 100 07/21/2023   CO2 29 07/21/2023    Lab Results  Component Value Date   ALT 23 07/20/2023   AST 31 07/20/2023   ALKPHOS 71 07/20/2023   BILITOT 0.4 07/20/2023     Microbiology: No results found for this or any previous visit (from the past 240 hour(s)).   Walter Eke, NP [facility]  07/21/2023  12:13 PM
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
## 2023-07-22 ENCOUNTER — Inpatient Hospital Stay (HOSPITAL_COMMUNITY): Payer: 59

## 2023-07-22 LAB — GLUCOSE, CAPILLARY
Glucose-Capillary: 218 mg/dL — ABNORMAL HIGH (ref 70–99)
Glucose-Capillary: 212 mg/dL — ABNORMAL HIGH (ref 70–99)
Glucose-Capillary: 160 mg/dL — ABNORMAL HIGH (ref 70–99)
Glucose-Capillary: 147 mg/dL — ABNORMAL HIGH (ref 70–99)
Glucose-Capillary: 140 mg/dL — ABNORMAL HIGH (ref 70–99)

## 2023-07-22 MED ORDER — INSULIN GLARGINE-YFGN 100 UNIT/ML ~~LOC~~ SOLN
10.0000 [IU] | Freq: Every day | SUBCUTANEOUS | Status: DC
  Administered 2023-07-22 – 2023-07-23 (×2): 10 [IU] via SUBCUTANEOUS
  Filled 2023-07-22 (×2): qty 0.1

## 2023-07-22 MED ORDER — INSULIN ASPART 100 UNIT/ML IJ SOLN
0.0000 [IU] | Freq: Three times a day (TID) | INTRAMUSCULAR | Status: DC
  Administered 2023-07-22: 2 [IU] via SUBCUTANEOUS
  Administered 2023-07-22: 5 [IU] via SUBCUTANEOUS
  Administered 2023-07-22 – 2023-07-24 (×3): 3 [IU] via SUBCUTANEOUS
  Administered 2023-07-26: 5 [IU] via SUBCUTANEOUS
  Administered 2023-07-27 (×3): 3 [IU] via SUBCUTANEOUS
  Administered 2023-07-28: 2 [IU] via SUBCUTANEOUS

## 2023-07-22 MED ORDER — INSULIN ASPART 100 UNIT/ML IJ SOLN: 0.0000 [IU] | Freq: Every day | INTRAMUSCULAR | Status: DC

## 2023-07-22 NOTE — Plan of Care (Signed)
Patient is [name]. Able to call for needs independently. Tube feedings on hold. Patient worked with SLP today and diet has been advanced to full liquid. Patient is tolerating diet. Pain controlled with PRN medication. Multiple wounds throughout the body. Wound care done as ordered. VSS. Continues on O2@3L .   Problem: Education: Goal: Knowledge of General Education information will improve Description: Including pain rating scale, medication(s)/side effects and non-pharmacologic comfort measures Outcome: Progressing   Problem: Health Behavior/Discharge Planning: Goal: Ability to manage health-related needs will improve Outcome: Progressing   Problem: Clinical Measurements: Goal: Ability to maintain clinical measurements within normal limits will improve Outcome: Progressing Goal: Will remain free from infection Outcome: Progressing Goal: Diagnostic test results will improve Outcome: Progressing Goal: Respiratory complications will improve Outcome: Progressing Goal: Cardiovascular complication will be avoided Outcome: Progressing   Problem: Activity: Goal: Risk for activity intolerance will decrease Outcome: Progressing   Problem: Nutrition: Goal: Adequate nutrition will be maintained Outcome: Progressing   Problem: Coping: Goal: Level of anxiety will decrease Outcome: Progressing   Problem: Elimination: Goal: Will not experience complications related to bowel motility Outcome: Progressing Goal: Will not experience complications related to urinary retention Outcome: Progressing   Problem: Pain Managment: Goal: General experience of comfort will improve Outcome: Progressing   Problem: Safety: Goal: Ability to remain free from injury will improve Outcome: Progressing   Problem: Skin Integrity: Goal: Risk for impaired skin integrity will decrease Outcome: Progressing   Problem: Education: Goal: Ability to describe self-care measures that may prevent or decrease  complications (Diabetes Survival Skills Education) will improve Outcome: Progressing Goal: Individualized Educational Video(s) Outcome: Progressing   Problem: Coping: Goal: Ability to adjust to condition or change in health will improve Outcome: Progressing   Problem: Fluid Volume: Goal: Ability to maintain a balanced intake and output will improve Outcome: Progressing   Problem: Health Behavior/Discharge Planning: Goal: Ability to identify and utilize available resources and services will improve Outcome: Progressing Goal: Ability to manage health-related needs will improve Outcome: Progressing   Problem: Metabolic: Goal: Ability to maintain appropriate glucose levels will improve Outcome: Progressing   Problem: Nutritional: Goal: Maintenance of adequate nutrition will improve Outcome: Progressing Goal: Progress toward achieving an optimal weight will improve Outcome: Progressing   Problem: Skin Integrity: Goal: Risk for impaired skin integrity will decrease Outcome: Progressing   Problem: Tissue Perfusion: Goal: Adequacy of tissue perfusion will improve Outcome: Progressing

## 2023-07-22 NOTE — TOC Progression Note (Signed)
Transition of Care [facility]) - Progression Note    Patient Details  Name: [name] MRN: [id number] Date of Birth: 10/17/1956  [facility]) CM/SW Contact  Erin Sons, [place] Phone Number: 07/22/2023, 9:54 AM  Clinical Narrative:     TOC will continue to follow patient for discharge needs. Current disposition is to SNF for short term rehab pending medical stability.    Expected Discharge Plan: Skilled Nursing Facility Barriers to Discharge: Continued Medical Work up                                Social Determinants of Health (SDOH) Interventions SDOH Screenings   Food Insecurity: No Food Insecurity (06/30/2023)  Recent Concern: Food Insecurity - Food Insecurity Present (06/24/2023)  Housing: Low Risk  (06/30/2023)  Transportation Needs: No Transportation Needs (06/30/2023)  Utilities: Not At Risk (06/30/2023)  Tobacco Use: High Risk (06/29/2023)    Readmission Risk Interventions    06/22/2023    4:22 PM 05/04/2023    1:50 PM  Readmission Risk Prevention Plan  Transportation Screening Complete Complete  PCP or Specialist Appt within 5-7 Days  Complete  PCP or Specialist Appt within 3-5 Days Complete   Home Care Screening  Complete  Medication Review (RN CM)  Referral to Pharmacy  HRI or Home Care Consult Complete   Social Work Consult for Recovery Care Planning/Counseling Complete   Palliative Care Screening Complete   Medication Review [employer]) Complete

## 2023-07-22 NOTE — Procedures (Signed)
Modified Barium Swallow Study  Patient Details  Name: [name] MRN: [id number] Date of Birth: 11-15-1956  Today's Date: 07/22/2023  Modified Barium Swallow completed.  Full report located under Chart Review in the Imaging Section.  History of Present Illness Pt is a 67 yo male presenting 7/8 with osteomyelitis of the right greater trochanter and aspiration pneumonia. Clinical SLP eval this admission was suggestive of functional swallowing (similar to presentation in 2021, at which time pt was also admitted with PNA). Instrumental testing was recommending given concern for silent aspiration but orders were d/c 7/13 when decision was made to pursue comfort measures. Pt rescinded comfort measures 7/14 and on 7/25 SLP was reconsulted due to concern for new symptoms of dysphagia/odynophagia (tx initiated for thrush). PMH includes: severe deconditioning/functional paraplegia and malnutrition, cervical fusion. MBS completed on 07/17/23 recommending continue NPO. Cortrak placed for nutrition.   Clinical Impression Patient presents with improved swallow function as compared to results of modified barium swallow study completed on 07/17/23. Of note, during previous MBS, patient did not have Cortrak feeding tube in place but during today's MBS, he did. SLP assessed his swallow function via the following consistencies of barium: thin, honey thick, necar thick puree, mechanical soft. Epiglottis was very difficult to visualize on MBS and appeared to be shortened, and stub-like in shape. Swallow was initiated at level of pyriform sinus with all consistencies. Mild amount of pharyngeal residuals remained in vallecular sinus, pyriform sinus and aryepiglottic folds, which cleared to trace with subsequent swallows. (performed independently by patient). Two instances of trace, flash penetration of thin liquids occured (PAS 2) above vocal cords and exiting fully. No other incidents of penetration or aspiration occured  during any phase of the swallow. No significant amount of residuals observed below PES. SLP recommending initiate PO diet of full liquids (thin consistency) Factors that may increase risk of adverse event in presence of aspiration [name] [name]): Poor general health and/or compromised immunity;Dependence for feeding and/or oral hygiene;Frail or deconditioned;Limited mobility;Weak cough  Swallow Evaluation Recommendations Recommendations: PO diet PO Diet Recommendation: Full liquid diet;Thin liquids (Level 0) Liquid Administration via: Cup;Straw Medication Administration: Crushed with puree Supervision: Full supervision/cueing for swallowing strategies;Full assist for feeding Swallowing strategies  : Minimize environmental distractions;Slow rate;Small bites/sips Postural changes: Position pt fully upright for meals Oral care recommendations: Oral care BID (2x/day);Staff/trained caregiver to provide oral care     Angela Nevin, MA, CCC-SLP Speech Therapy

## 2023-07-22 NOTE — Progress Notes (Addendum)
Subjective:   Summary: [name] is a 67 y.o. year old male with history significant for severe deconditioning/functional paraplegia and malnutrition, currently admitted on the IMTS HD#22 for osteomyelitis of the right greater trochanter and aspiration pneumonia.  Overnight Events: None  Saw today at bedside. When asked how he was doing, patient replied "I'm here." Discussed SLP coming today and addressed concerns. Patient is motivated to resume oral intake and felt he can handle swallow therapy with that goal in mind. No new pain or difficulty with breathing.    Objective:  Vital signs in last 24 hours: Vitals:   07/21/23 1616 07/21/23 2044 07/22/23 0412 07/22/23 0447  BP: (!) 132/58 (!) 150/70 (!) 141/64   Pulse: 84 88 84   Resp: 17 20 20    Temp: 98.1 F (36.7 C) 97.6 F (36.4 C) 97.7 F (36.5 C)   TempSrc: Oral Oral Oral   SpO2: 98% 100% 97%   Weight:    89 kg  Height:       Supplemental O2: Nasal Cannula SpO2: 97 % O2 Flow Rate (L/min): 3 L/min   Physical Exam:  Constitutional: chronically sick-appearing, laying in bed, in no acute distress Cardiovascular: RRR, no murmurs, rubs or gallops Pulmonary/Chest: Normal respiratory rate, no accessory muscle use. CTA bilaterally but exam limited by patient immobility Abdominal: soft, non-tender, non-distended Skin: warm and dry Mouth: Oral thrush present  Filed Weights   07/19/23 0500 07/21/23 0343 07/22/23 0447  Weight: 89 kg 89 kg 89 kg     Intake/Output Summary (Last 24 hours) at 07/22/2023 0745 Last data filed at 07/22/2023 0706 Gross per 24 hour  Intake 1629.7 ml  Output 1250 ml  Net 379.7 ml   Net IO Since Admission: 1,253.05 mL [07/22/23 0745]  Pertinent Labs:    Latest Ref Rng & Units 07/22/2023    2:50 AM 07/21/2023    3:04 AM 07/20/2023    1:52 AM  CBC  WBC 4.0 - 10.5 K/uL 10.2  11.5  9.7   Hemoglobin 13.0 - 17.0 g/dL 7.4  8.0  7.8   Hematocrit 39.0 - 52.0 % 24.0  25.1  25.3    Platelets 150 - 400 K/uL 601  666  668        Latest Ref Rng & Units 07/22/2023    2:50 AM 07/21/2023    3:04 AM 07/20/2023    1:52 AM  CMP  Glucose 70 - 99 mg/dL 161  096  045   BUN 8 - 23 mg/dL 19  17  19    Creatinine 0.61 - 1.24 mg/dL 4.09  8.11  9.14   Sodium 135 - 145 mmol/L 130  135  136   Potassium 3.5 - 5.1 mmol/L 4.1  3.8  3.8   Chloride 98 - 111 mmol/L 96  100  104   CO2 22 - 32 mmol/L 27  29  27    Calcium 8.9 - 10.3 mg/dL 8.0  8.2  7.9   Total Protein 6.5 - 8.1 g/dL   4.9   Total Bilirubin 0.3 - 1.2 mg/dL   0.4   Alkaline Phos 38 - 126 U/L   71   AST 15 - 41 U/L   31   ALT 0 - 44 U/L   23     Assessment/Plan:   Principal Problem:   Infected decubitus ulcer Active Problems:   Diabetes mellitus (HCC)  Protein-calorie malnutrition, severe   Osteomyelitis of right femur (HCC)   Goals of care, counseling/discussion   Osteomyelitis (HCC)   Aspiration pneumonia of left lower lobe (HCC)   Parapneumonic effusion   Need for management of chest tube   Pleural effusion   Pneumonia of right lung due to infectious organism   Empyema (HCC)   Oropharyngeal candidiasis   Patient Summary: [name] is a 66 y.o. with a pertinent PMH of functional paraplegia, who presented with infected decubitus ulcer/osteomyelitis and admitted for osteomyelitis and aspiration pneumonia.    #Aspiration Pneumonia #Loculated left pleural effusion s/p chest tube #Worsening pneumonia of right lung He is breathing comfortably on 3L [place]. Chest tube has been removed by PCCM. ID has been consulted for ongoing antibiotic requirements. SLP following with goal to achieve oral intake and reduce risk for silent aspiration.  -Last day of Unsayn 3g IV, Linezolid 600mg  q12h -Per ID, begin daptomycin and and ceftriaxone tomorrow to complete 6 week abx course -Repeat pleural fluid cytology still pending -Repeat CXR today as patient has been on NPO holiday -PCCM following  #Oral/Esophageal  Thrush #Acute on chronic dysphagia Somewhat improved oral thrush and dysphagia. Receiving tube feeds through a [facility]. Mag and Phos remain WNL. No new changes in management.  -Tube feeds -NPO -SLP will attempt MBS today, patient refused last attempt. Appreciate assistance -Medications administered IV/IM given his intolerance of oral medications -Fluconazole 200mg  IV daily, ending 8/9  #Osteomyelitis of the right greater trochanter #R hip decubitus ulcer Chronic R hip decubitus ulcer with new osteomyelitis of the right greater trochanter. On week 2 of 6 week abx course -Last day Unasyn 3g IV, Linezolid 600mg  q12 IV -Per ID start daptomycin and ceftriaxone tomorrow -Tylenol per tube q6h scheduled, IV dilaudid 0.5mg  q4h PRN -Frequent offloading and turning -Dressing changes 3 times daily per general surgery recommendations with dry kerlix gauze, medihoney or santyl  #Severe deconditioning and weakness #Severe malnutrition in setting of acute illness We are increasingly concerned about his overall picture and poor prognosis given his secure deconditioning, acute on chronic dysphagia, worsening infections despite antibiotic use, poor wound healing, and refusal to participate in therapies. We will continue to engage with him and his brother about goals of care discussions moving forward.   #Type 2 Diabetes Mellitus with Hyperglycemia - SSI- M -Add Semglee 10 units daily  Diet: NPO, Tube feeds IVF: None,None VTE: Enoxaparin Code: DNR PT/OT recs: SNF for Subacute PT,  Family Update: brother [name], to be notified   Dispo: Anticipated discharge to Skilled nursing facility pending medical stability.   Monna Fam , MD PGY-1 Internal Medicine Resident Pager Number [phone]    Monna Fam, MD

## 2023-07-22 NOTE — Progress Notes (Signed)
Physical Therapy Treatment Patient Details Name: [name] MRN: [id number] DOB: Dec 29, 1955 Today's Date: 07/22/2023   History of Present Illness 67 yo male admitted on 7/9 with decubitus ulcer; pt and brother unable to manage at home; recent admission, dc'd 7/8, returned on 7/9; Hydrotherapy initiated; 7/15 difficulty breathing with pleural effusion, L chest tube placed; with PMH of hypertension, gastric ulcers, severe deconditioning, left femoral fracture s/p IMN, previously admitted and treated for right hip osteomyelitis. Discharged with home health    PT Comments  Continuing work on functional mobility and activity tolerance;  Pt declining work on sitting EOB today, and needed max encouragement to allow for getting the bed to a near seated position to help with gently increasing activity tolerance/upright tolerance -- initial steps towards return to his independent functional level prior to his fall and femur fracture in April of this year; Reported some dizziness as he approached near upright sitting (with full back support in the bed); BPs as follows:  Initial, with HOB slightly elevated: 133/70, MAP 89, HR 86  Bed in close to upright seated position Initial                 139/63, MAP 84, HR 79 After 3 minutes  137/57, MAP 80, HR 79  Noteworthy that MAP drifted down somewhat with incr time in upright position, but did not observe long enough to draw any solid conclusions about pt's BP response to upright positioning;   Performed session on room air with monitor of O2 sats; Pt's O2 sat stayed at or above 91% for entire session on room air; discussed with RN and restarted supplemetnal O2 at 2 L; notified Dr. Carlynn Purl via secure chat;  Pt with difficulty participating with PT sessions; Because his SCI contributes to all aspects of his care, I'm wondering if consulting PM&R Team for suggestions for pain management would be worth consideration; Discussed with Dr. Carlynn Purl;   Pt's brother  plans to bring in pt's wheelchair; once we have an adequate pressure redistribution cushion, we can consider using the [facility] for initail OOB to chair efforts; Will also consider asking Mobility Team to try a daily program of getting pt to more upright sitting using the bed to chair functionality     If plan is discharge home, recommend the following: Assist for transportation;Help with stairs or ramp for entrance;Other (comment);Two people to help with walking and/or transfers;A lot of help with walking and/or transfers   Can travel by private vehicle     No  Equipment Recommendations  Other (comment) (May need a higher profile pressure redistribution cushion)    Recommendations for Other Services       Precautions / Restrictions Precautions Precautions: Fall Precaution Comments: fell in 03/2023 with L femur fx (missed w/c during transfer), 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) Restrictions Weight Bearing Restrictions: No     Mobility  Bed Mobility Overal bed mobility: Needs Assistance             General bed mobility comments: Used bed to chair position, as well as forward tipping (reverse trendelenburg positioning) to get pt into a more upright seated position; pt declining to get up to EOB    Transfers                   General transfer comment: Total assist for transfers since April;  Must also consider having an appropriate seating surface for pressure redistribution as we work towards pt spending more time  OOB    Ambulation/Gait                   Stairs             Wheelchair Mobility     Tilt Bed    Modified Rankin (Stroke Patients Only)       Balance                                            Cognition Arousal/Alertness: Awake/alert Behavior During Therapy: WFL for tasks assessed/performed Overall Cognitive Status: Within Functional Limits for tasks assessed                                           Exercises Other Exercises Other Exercises: Pt declined UE therex    General Comments General comments (skin integrity, edema, etc.): Performed session on room air with monitor of O2 sats; Pt's O2 sat stayed at or above 91% for entire session on room air; discussed with RN and restarted supplemetnal O2 at 2 L; notified dr. Carlynn Purl via secure chat      Pertinent Vitals/Pain Pain Assessment Pain Assessment: Faces Faces Pain Scale: Hurts even more Pain Location: buttocks, back, LEs Pain Descriptors / Indicators: Discomfort Pain Intervention(s): Monitored during session    Home Living                          Prior Function            PT Goals (current goals can now be found in the care plan section) Acute Rehab PT Goals Patient Stated Goal: Hopes teh swallow eval goes well PT Goal Formulation: With patient Time For Goal Achievement: 08/04/23 Potential to Achieve Goals: Poor Progress towards PT goals: Progressing toward goals;Goals downgraded-see care plan (very slowly)    Frequency    Min 1X/week      PT Plan Current plan remains appropriate    Co-evaluation              AM-PAC PT "6 Clicks" Mobility   Outcome Measure  Help needed turning from your back to your side while in a flat bed without using bedrails?: Total Help needed moving from lying on your back to sitting on the side of a flat bed without using bedrails?: Total Help needed moving to and from a bed to a chair (including a wheelchair)?: Total Help needed standing up from a chair using your arms (e.g., wheelchair or bedside chair)?: Total Help needed to walk in hospital room?: Total Help needed climbing 3-5 steps with a railing? : Total 6 Click Score: 6    End of Session Equipment Utilized During Treatment: Oxygen (adn bed position functions) Activity Tolerance: Patient limited by pain Patient left: in bed;with call bell/phone within reach Nurse Communication: Mobility  status;Other (comment) (O2 sats on room air) PT Visit Diagnosis: Muscle weakness (generalized) (M62.81);History of falling (Z91.81);Difficulty in walking, not elsewhere classified (R26.2)     Time: 1610-9604 PT Time Calculation (min) (ACUTE ONLY): 30 min  Charges:    $Therapeutic Activity: 23-37 mins PT General Charges $$ ACUTE PT VISIT: 1 Visit                     [facility]  [name], PT  Acute Rehabilitation Services Office [phone] Secure Chat welcomed    [name] 07/22/2023, 2:03 PM

## 2023-07-23 DIAGNOSIS — E1165 Type 2 diabetes mellitus with hyperglycemia: Secondary | ICD-10-CM

## 2023-07-23 DIAGNOSIS — L8994 Pressure ulcer of unspecified site, stage 4: Secondary | ICD-10-CM

## 2023-07-23 LAB — GLUCOSE, CAPILLARY
Glucose-Capillary: 100 mg/dL — ABNORMAL HIGH (ref 70–99)
Glucose-Capillary: 103 mg/dL — ABNORMAL HIGH (ref 70–99)
Glucose-Capillary: 122 mg/dL — ABNORMAL HIGH (ref 70–99)
Glucose-Capillary: 158 mg/dL — ABNORMAL HIGH (ref 70–99)
Glucose-Capillary: 176 mg/dL — ABNORMAL HIGH (ref 70–99)
Glucose-Capillary: 190 mg/dL — ABNORMAL HIGH (ref 70–99)
Glucose-Capillary: 84 mg/dL (ref 70–99)

## 2023-07-23 LAB — GLUCOSE, RANDOM: Glucose, Bld: 179 mg/dL — ABNORMAL HIGH (ref 70–99)

## 2023-07-23 MED ORDER — HYDROMORPHONE HCL 2 MG PO TABS
1.0000 mg | ORAL_TABLET | ORAL | Status: DC | PRN
  Administered 2023-07-23 – 2023-07-27 (×5): 1 mg via ORAL
  Filled 2023-07-23 (×6): qty 1

## 2023-07-23 MED ORDER — JUVEN PO PACK
1.0000 | PACK | Freq: Two times a day (BID) | ORAL | Status: DC
  Administered 2023-07-23 – 2023-07-27 (×8): 1
  Filled 2023-07-23 (×8): qty 1

## 2023-07-23 MED ORDER — ENSURE ENLIVE PO LIQD
237.0000 mL | Freq: Two times a day (BID) | ORAL | Status: DC
  Administered 2023-07-24 – 2023-07-27 (×6): 237 mL via ORAL

## 2023-07-23 MED ORDER — SODIUM CHLORIDE 0.9 % IV SOLN
2.0000 g | INTRAVENOUS | Status: DC
  Administered 2023-07-23 – 2023-07-28 (×6): 2 g via INTRAVENOUS
  Filled 2023-07-23 (×6): qty 20

## 2023-07-23 MED ORDER — DAPTOMYCIN 500 MG IV SOLR
6.0000 mg/kg | Freq: Every day | INTRAVENOUS | Status: DC
Start: 2023-07-23 — End: 2023-07-23

## 2023-07-23 MED ORDER — HYDROMORPHONE HCL 2 MG PO TABS
2.0000 mg | ORAL_TABLET | Freq: Four times a day (QID) | ORAL | Status: DC
  Administered 2023-07-23 – 2023-07-28 (×21): 2 mg via ORAL
  Filled 2023-07-23 (×22): qty 1

## 2023-07-23 MED ORDER — SODIUM CHLORIDE 0.9 % IV SOLN: 1.0000 g | INTRAVENOUS | Status: DC

## 2023-07-23 MED ORDER — SODIUM CHLORIDE 0.9 % IV SOLN
8.0000 mg/kg | Freq: Every day | INTRAVENOUS | Status: DC
  Administered 2023-07-23 – 2023-07-28 (×6): 700 mg via INTRAVENOUS
  Filled 2023-07-23 (×6): qty 14

## 2023-07-23 MED ORDER — PIVOT 1.5 CAL PO LIQD
1000.0000 mL | ORAL | Status: DC
  Administered 2023-07-23 – 2023-07-26 (×3): 1000 mL
  Filled 2023-07-23 (×6): qty 1000

## 2023-07-23 NOTE — Plan of Care (Signed)
Patient is [name]. Able to call for needs appropriately. Using O2 @3L  via [place]. Tube feedings were resumed this shift. Wound care x2 this shift as ordered. Third dressing change will be done tonight. Tube feeding initiated as ordered. Patient is still eating by mouth, however does not like the food and has poor appetite. VSS. Pain well managed with medication regimen.   Problem: Education: Goal: Knowledge of General Education information will improve Description: Including pain rating scale, medication(s)/side effects and non-pharmacologic comfort measures Outcome: Progressing   Problem: Health Behavior/Discharge Planning: Goal: Ability to manage health-related needs will improve Outcome: Progressing   Problem: Clinical Measurements: Goal: Ability to maintain clinical measurements within normal limits will improve Outcome: Progressing Goal: Will remain free from infection Outcome: Progressing Goal: Diagnostic test results will improve Outcome: Progressing Goal: Respiratory complications will improve Outcome: Progressing Goal: Cardiovascular complication will be avoided Outcome: Progressing   Problem: Activity: Goal: Risk for activity intolerance will decrease Outcome: Progressing   Problem: Nutrition: Goal: Adequate nutrition will be maintained Outcome: Progressing   Problem: Coping: Goal: Level of anxiety will decrease Outcome: Progressing   Problem: Elimination: Goal: Will not experience complications related to bowel motility Outcome: Progressing Goal: Will not experience complications related to urinary retention Outcome: Progressing   Problem: Pain Managment: Goal: General experience of comfort will improve Outcome: Progressing   Problem: Safety: Goal: Ability to remain free from injury will improve Outcome: Progressing   Problem: Skin Integrity: Goal: Risk for impaired skin integrity will decrease Outcome: Progressing   Problem: Education: Goal: Ability to  describe self-care measures that may prevent or decrease complications (Diabetes Survival Skills Education) will improve Outcome: Progressing Goal: Individualized Educational Video(s) Outcome: Progressing   Problem: Coping: Goal: Ability to adjust to condition or change in health will improve Outcome: Progressing   Problem: Fluid Volume: Goal: Ability to maintain a balanced intake and output will improve Outcome: Progressing   Problem: Health Behavior/Discharge Planning: Goal: Ability to identify and utilize available resources and services will improve Outcome: Progressing Goal: Ability to manage health-related needs will improve Outcome: Progressing   Problem: Metabolic: Goal: Ability to maintain appropriate glucose levels will improve Outcome: Progressing   Problem: Nutritional: Goal: Maintenance of adequate nutrition will improve Outcome: Progressing Goal: Progress toward achieving an optimal weight will improve Outcome: Progressing   Problem: Skin Integrity: Goal: Risk for impaired skin integrity will decrease Outcome: Progressing   Problem: Tissue Perfusion: Goal: Adequacy of tissue perfusion will improve Outcome: Progressing

## 2023-07-23 NOTE — Plan of Care (Signed)
  Problem: Clinical Measurements: Goal: Diagnostic test results will improve Outcome: Progressing   

## 2023-07-23 NOTE — Progress Notes (Signed)
Nutrition Follow-up  DOCUMENTATION CODES:   Severe malnutrition in context of acute illness/injury  INTERVENTION:  Ensure Enlive po BID, each supplement provides 350 kcal and 20 grams of protein. Juven BID, each packet provides 95 calories, 2.5 grams of protein (collagen), and 9.8 grams of carbohydrate (3 grams sugar); also contains 7 grams of L-arginine and L-glutamine, 300 mg vitamin C, 15 mg vitamin E, 1.2 mcg vitamin B-12, 9.5 mg zinc, 200 mg calcium, and 1.5 g  Calcium Beta-hydroxy-Beta-methylbutyrate to support wound healing Multivitamin w/ minerals daily Magic cup TID with meals, each supplement provides 290 kcal and 9 grams of protein Tube Feeds via Cortrak: Pivot 1.5 at 60 mL/hr (1440 mL per day) Regimen provides 2160 kcal, 135 gm protein, and 1080 mL free water daily. Recommend continuing TF until pt abel to consistently eat >60% of estimated needs Banatrol TF - TID   NUTRITION DIAGNOSIS:   Severe Malnutrition related to acute illness as evidenced by severe fat depletion, severe muscle depletion, energy intake < 75% for > 7 days. - Remains applicable  GOAL:   Patient will meet greater than or equal to 90% of their needs - Ongoing   MONITOR:   PO intake, Supplement acceptance  REASON FOR ASSESSMENT:   Malnutrition Screening Tool    ASSESSMENT:   67 y.o. male admits related to infected decubitus ulcer. PMH includes: HTN, DM, HLD, paraplegia, severe deconditioning. Pt is currently receiving medical management related to infected decubitus ulcer and aspiration pneumonia.  7/08 - Admitted  7/15 - L chest tube placed 7/23 - Calorie count concluded 7/26 - MBS; NPO; Cortrak placed; TF initiated 7/28 - chest tube removed  7/31 - MBS; diet advanced to full liquids   Pt laying in bed. Ready to have Cortrak removed, discussed leaving to allow for additional nutrition to help heal wounds. RD observed ate Magic Cup at bedside, pt reports that he likes those and Ensure's.    No TF running at time of RD visit. Spoke with RN, RN reports that they were held for [facility] yesterday and never restarted.  Discussed with MD recommendations of continuing feeds until pt able to consistently eat >60% of his needs. MD agreeable.   Meal Intake 7/18-7/23: 0-100% x 8 meals (average 44%)  Medications reviewed and include: Vitamin C, NovoLog SSI, Semglee, MVI, Senokot-S, Thiamine, IV antibiotics, IV Diflucan Labs reviewed: Sodium 132, Potassium 4.0 CBG: 84-160 x 24 hrs  UOP: 1800 mL x 24 hrs  Diet Order:   Diet Order             Diet full liquid Room service appropriate? Yes; Fluid consistency: Thin  Diet effective now                   EDUCATION NEEDS:   Not appropriate for education at this time  Skin:  Skin Assessment: Skin Integrity Issues: Skin Integrity Issues:: Stage IV Stage II: R buttocks; L Hip; R lateral lumbar Stage IV: L lateral heel Unstageable: R upper buttocks; R lateral foot; R lateral heel; L heel  Last BM:  8/1 - Type 6  Height:   Ht Readings from Last 1 Encounters:  06/30/23 5\' 10"  (1.778 m)    Weight:   Wt Readings from Last 1 Encounters:  07/22/23 89 kg   BMI:  Body mass index is 28.15 kg/m.  Estimated Nutritional Needs:  Kcal:  2200-2400 Protein:  120-140 Fluid:  >/= 2 L/day   Kirby Crigler RD, LDN Clinical Dietitian See [facility] for contact  information.

## 2023-07-23 NOTE — Progress Notes (Signed)
Subjective:   Summary: [name] is a 67 y.o. year old male with history significant for severe deconditioning/functional paraplegia and malnutrition, currently admitted on the IMTS HD#23 for osteomyelitis of the right greater trochanter and aspiration pneumonia.  Overnight Events: None  Saw today at bedside. Patient was pleased to be able to consume liquids, dislikes still having his CorTrack in place. Discussed his disposition including our recommendations for stay in a SNF after discharge. Denies any new pain or shortness of breath.    Objective:  Vital signs in last 24 hours: Vitals:   07/22/23 1534 07/22/23 2154 07/23/23 0527 07/23/23 0738  BP: (!) 129/59 (!) 143/76 (!) 126/56 133/69  Pulse: 92 88 87 85  Resp: 17 18 18 14   Temp: 97.6 F (36.4 C) 98.2 F (36.8 C) (!) 97.4 F (36.3 C) 97.7 F (36.5 C)  TempSrc: Oral Oral Oral Oral  SpO2: 99% 100% 95% 100%  Weight:      Height:       Supplemental O2: Nasal Cannula SpO2: 100 % O2 Flow Rate (L/min): 3 L/min   Physical Exam:  Constitutional: chronically sick-appearing, laying in bed, in no acute distress Cardiovascular: RRR, no murmurs, rubs or gallops Pulmonary/Chest: Normal respiratory rate, no accessory muscle use. CTA bilaterally but exam limited by patient immobility Abdominal: soft, non-tender, non-distended Skin: warm and dry Mouth: Oral thrush present  Filed Weights   07/19/23 0500 07/21/23 0343 07/22/23 0447  Weight: 89 kg 89 kg 89 kg     Intake/Output Summary (Last 24 hours) at 07/23/2023 1048 Last data filed at 07/23/2023 0500 Gross per 24 hour  Intake 2981.17 ml  Output 1800 ml  Net 1181.17 ml   Net IO Since Admission: 2,434.22 mL [07/23/23 1048]  Pertinent Labs:    Latest Ref Rng & Units 07/23/2023    5:03 AM 07/22/2023    2:50 AM 07/21/2023    3:04 AM  CBC  WBC 4.0 - 10.5 K/uL 7.8  10.2  11.5   Hemoglobin 13.0 - 17.0 g/dL 7.5  7.4  8.0   Hematocrit 39.0 - 52.0 % 24.6   24.0  25.1   Platelets 150 - 400 K/uL 608  601  666        Latest Ref Rng & Units 07/23/2023    5:03 AM 07/22/2023    2:50 AM 07/21/2023    3:04 AM  CMP  Glucose 70 - 99 mg/dL 97  098  119   BUN 8 - 23 mg/dL 17  19  17    Creatinine 0.61 - 1.24 mg/dL 1.47  8.29  5.62   Sodium 135 - 145 mmol/L 132  130  135   Potassium 3.5 - 5.1 mmol/L 4.0  4.1  3.8   Chloride 98 - 111 mmol/L 97  96  100   CO2 22 - 32 mmol/L 29  27  29    Calcium 8.9 - 10.3 mg/dL 8.0  8.0  8.2     Assessment/Plan:   Principal Problem:   Infected decubitus ulcer Active Problems:   Type 2 diabetes mellitus with hyperglycemia (HCC)   Protein-calorie malnutrition, severe   Osteomyelitis of right femur (HCC)   Goals of care, counseling/discussion   Osteomyelitis (HCC)   Aspiration pneumonia of left lower lobe (HCC)   Parapneumonic effusion   Need for management of chest tube   Pleural effusion   Pneumonia of right lung due  to infectious organism   Empyema (HCC)   Oropharyngeal candidiasis   Patient Summary: [name] is a 67 y.o. with a pertinent PMH of functional paraplegia, who presented with infected decubitus ulcer/osteomyelitis and admitted for osteomyelitis and aspiration pneumonia.    #Aspiration Pneumonia #Loculated left pleural effusion s/p chest tube #Worsening pneumonia of right lung He is breathing comfortably on 3L [place]. Chest tube has been removed by PCCM. ID has been consulted for ongoing antibiotic requirements. SLP following with goal to achieve oral intake and reduce risk for silent aspiration.  -Antibiotics to treat CAP completed 7/31, Daptomycin and ceftriaxone continued for osteomyelitis -Repeat CXR 7/31 shows significant improvement in bilateral airspace opacities -Repeat pleural fluid cytology still pending -PCCM following  #Oral/Esophageal Thrush #Acute on chronic dysphagia Somewhat improved oral thrush and dysphagia. Receiving tube feeds through a CorTrack. Mag and Phos remain  WNL. No new changes in management.  -Swallow study 7/31 shows improvement from previous, SLP recommends full liquid diet -Per dietician, will keep CorTrack in place with continued tube feeds until patient can consistently consume 60% of calories PO -Fluconazole 200mg  IV daily, ending 8/9  #Osteomyelitis of the right greater trochanter #R hip decubitus ulcer Chronic R hip decubitus ulcer with new osteomyelitis of the right greater trochanter. On week 2 of 6 week abx course -Daptomycin 8mg /kg and Ceftriaxone 2g started 8/1 per ID to complete 6 week abx course, ending Sep 5 -Transitioned to oral pain medication, dilaudid 2mg  scheduled q6, PRN dilaudid 1mg  q4 -Frequent offloading and turning -Dressing changes 3 times daily per general surgery recommendations with dry kerlix gauze, medihoney or santyl  #Severe deconditioning and weakness #Severe malnutrition in setting of acute illness We are increasingly concerned about his overall picture and poor prognosis given his severe deconditioning, acute on chronic dysphagia, continued infections despite antibiotic use, poor wound healing, and refusal to participate in therapies. We will continue to engage with him and his brother about goals of care discussions moving forward.   #Type 2 Diabetes Mellitus with Hyperglycemia - SSI- M -Additional Semglee discontinued for low BG readings. Will CTM  Diet: Fluids, Tube feeds IVF: None,None VTE: Enoxaparin Code: DNR PT/OT recs: SNF for Subacute PT,  Family Update: brother [name], to be notified   Dispo: Anticipated discharge to Skilled nursing facility pending medical stability.   Monna Fam , MD PGY-1 Internal Medicine Resident Pager Number [phone]    Monna Fam, MD

## 2023-07-23 NOTE — Progress Notes (Signed)
Speech Language Pathology Treatment: Dysphagia  Patient Details Name: [name] MRN: [id number] DOB: December 30, 1955 Today's Date: 07/23/2023 Time: 1601-0932 SLP Time Calculation (min) (ACUTE ONLY): 10 min  Assessment / Plan / Recommendation Clinical Impression  Patient seen by SLP for skilled treatment focused on dysphagia goals. He was awake, bed reclined at approximately 45 degrees. When SLP started to raise [facility], patient said, "its high enough." When SLP asked about his PO intake he mentioned not liking the potato soup. SLP  had read in chart that patient liked the Ensure shakes but when asked about this and if he had had any today he said, "nobody brought me any". He was receptive to having a strawberry Ensure which he drank via straw sips. Swallow initiation appeared timely and patient drank rapidly. No overt s/s aspiration during or after PO intake. SLP recommending continue with full liquids diet but will plan to trial upgrade solids when appropriate.    HPI HPI: Pt is a 67 yo male presenting 7/8 with osteomyelitis of the right greater trochanter and aspiration pneumonia. Clinical SLP eval this admission was suggestive of functional swallowing (similar to presentation in 2021, at which time pt was also admitted with PNA). Instrumental testing was recommending given concern for silent aspiration but orders were d/c 7/13 when decision was made to pursue comfort measures. Pt rescinded comfort measures 7/14 and on 7/25 SLP was reconsulted due to concern for new symptoms of dysphagia/odynophagia (tx initiated for thrush). PMH includes: severe deconditioning/functional paraplegia and malnutrition, cervical fusion. MBS completed on 07/17/23 recommending continue NPO. Cortrak placed for nutrition.      SLP Plan  Continue with current plan of care      Recommendations for follow up therapy are one component of a multi-disciplinary discharge planning process, led by the attending physician.   Recommendations may be updated based on patient status, additional functional criteria and insurance authorization.    Recommendations  Diet recommendations: Thin liquid;Other(comment) (full liquids) Liquids provided via: Cup;Straw Medication Administration: Other (Comment) (as tolerated) Supervision: Patient able to self feed;Staff to assist with self feeding Compensations: Slow rate;Small sips/bites Postural Changes and/or Swallow Maneuvers: Seated upright 90 degrees                  Oral care BID   Frequent or constant Supervision/Assistance Dysphagia, pharyngoesophageal phase (R13.14)     Continue with current plan of care    Angela Nevin, MA, CCC-SLP Speech Therapy

## 2023-07-24 ENCOUNTER — Inpatient Hospital Stay (HOSPITAL_COMMUNITY): Payer: 59

## 2023-07-24 ENCOUNTER — Inpatient Hospital Stay: Payer: 59 | Admitting: Internal Medicine

## 2023-07-24 ENCOUNTER — Other Ambulatory Visit: Payer: Self-pay

## 2023-07-24 LAB — GLUCOSE, CAPILLARY
Glucose-Capillary: 161 mg/dL — ABNORMAL HIGH (ref 70–99)
Glucose-Capillary: 187 mg/dL — ABNORMAL HIGH (ref 70–99)
Glucose-Capillary: 136 mg/dL — ABNORMAL HIGH (ref 70–99)
Glucose-Capillary: 101 mg/dL — ABNORMAL HIGH (ref 70–99)

## 2023-07-24 LAB — CK: Total CK: 42 U/L — ABNORMAL LOW (ref 49–397)

## 2023-07-24 LAB — PROTIME-INR
INR: 1.2 (ref 0.8–1.2)
Prothrombin Time: 15.4 seconds — ABNORMAL HIGH (ref 11.4–15.2)

## 2023-07-24 MED ORDER — ORAL CARE MOUTH RINSE: 15.0000 mL | OROMUCOSAL | Status: DC | PRN

## 2023-07-24 MED ORDER — INSULIN GLARGINE-YFGN 100 UNIT/ML ~~LOC~~ SOLN
5.0000 [IU] | Freq: Every day | SUBCUTANEOUS | Status: DC
  Administered 2023-07-24: 5 [IU] via SUBCUTANEOUS
  Filled 2023-07-24 (×3): qty 0.05

## 2023-07-24 MED ORDER — IPRATROPIUM-ALBUTEROL 0.5-2.5 (3) MG/3ML IN SOLN
3.0000 mL | Freq: Once | RESPIRATORY_TRACT | Status: AC
  Administered 2023-07-24: 3 mL via RESPIRATORY_TRACT
  Filled 2023-07-24: qty 3

## 2023-07-24 MED ORDER — ORAL CARE MOUTH RINSE
15.0000 mL | OROMUCOSAL | Status: DC
  Administered 2023-07-24 – 2023-07-27 (×8): 15 mL via OROMUCOSAL

## 2023-07-24 NOTE — Progress Notes (Signed)
   07/24/23 0557  Provider Notification  Provider Name/Title Monna Fam  Date Provider Notified 07/24/23  Time Provider Notified [phone]  Method of Notification Call;Page  Notification Reason Critical Result (Liver enzymes elevated. albumin & hgb decreased. no hx of anemia on file. platelets also elevated.)  Provider response No new orders (will notify dayshift)  Date of Provider Response 07/24/23  Time of Provider Response [phone]

## 2023-07-24 NOTE — Progress Notes (Signed)
Subjective:   Summary: [name] is a 67 y.o. year old male with history significant for severe deconditioning/functional paraplegia and malnutrition, currently admitted on the IMTS HD#24 for osteomyelitis of the right greater trochanter and aspiration pneumonia.  Overnight Events: Patient complained of dyspnea. RT saw, turned up [place] to 6L. Night team saw and examined, reported stable vitals, no respiratory distress, and unremarkable exam.   Saw today at bedside. Patient reports a period of dyspnea, denies any vomiting or regurgitation. Denies any current shortness of breath. Endorses some slight abdominal pain, negative Murphy sign. O2 turned down to 5L while in the room with maintenance of saturations.    Objective:  Vital signs in last 24 hours: Vitals:   07/24/23 0336 07/24/23 0422 07/24/23 0605 07/24/23 0929  BP:    (!) 114/51  Pulse: (!) 103 100  97  Resp: (!) 28 (!) 25  16  Temp: 98 F (36.7 C)   97.7 F (36.5 C)  TempSrc:    Oral  SpO2: 100% 95% 96% 100%  Weight:      Height:       Supplemental O2: Nasal Cannula SpO2: 100 % O2 Flow Rate (L/min): 6 L/min   Physical Exam:  Constitutional: chronically sick-appearing, laying in bed, in no acute distress Cardiovascular: RRR, no murmurs, rubs or gallops Pulmonary/Chest: Normal respiratory rate, no accessory muscle use. CTA bilaterally but exam limited by patient immobility Abdominal: soft, non-tender, non-distended Skin: warm and dry Mouth: Oral thrush present  Filed Weights   07/19/23 0500 07/21/23 0343 07/22/23 0447  Weight: 89 kg 89 kg 89 kg     Intake/Output Summary (Last 24 hours) at 07/24/2023 1329 Last data filed at 07/24/2023 0645 Gross per 24 hour  Intake 100 ml  Output 1575 ml  Net -1475 ml   Net IO Since Admission: 982.47 mL [07/24/23 1329]  Pertinent Labs:    Latest Ref Rng & Units 07/24/2023   12:29 AM 07/23/2023    5:03 AM 07/22/2023    2:50 AM  CBC  WBC 4.0 - 10.5 K/uL 9.4   7.8  10.2   Hemoglobin 13.0 - 17.0 g/dL 7.3  7.5  7.4   Hematocrit 39.0 - 52.0 % 23.6  24.6  24.0   Platelets 150 - 400 K/uL 603  608  601        Latest Ref Rng & Units 07/24/2023   12:29 AM 07/23/2023    4:00 PM 07/23/2023    5:03 AM  CMP  Glucose 70 - 99 mg/dL 213  086  97   BUN 8 - 23 mg/dL 22   17   Creatinine 5.78 - 1.24 mg/dL 4.69   6.29   Sodium 528 - 145 mmol/L 135   132   Potassium 3.5 - 5.1 mmol/L 3.9   4.0   Chloride 98 - 111 mmol/L 98   97   CO2 22 - 32 mmol/L 32   29   Calcium 8.9 - 10.3 mg/dL 7.8   8.0   Total Protein 6.5 - 8.1 g/dL 5.2     Total Bilirubin 0.3 - 1.2 mg/dL 0.5     Alkaline Phos 38 - 126 U/L 94     AST 15 - 41 U/L 129     ALT 0 - 44 U/L 144       Assessment/Plan:   Principal Problem:   Infected decubitus ulcer Active  Problems:   Type 2 diabetes mellitus with hyperglycemia (HCC)   Protein-calorie malnutrition, severe   Osteomyelitis of right femur (HCC)   Goals of care, counseling/discussion   Osteomyelitis (HCC)   Aspiration pneumonia of left lower lobe (HCC)   Parapneumonic effusion   Need for management of chest tube   Pleural effusion   Pneumonia of right lung due to infectious organism   Empyema (HCC)   Oropharyngeal candidiasis   Patient Summary: [name] is a 67 y.o. with a pertinent PMH of functional paraplegia, who presented with infected decubitus ulcer/osteomyelitis and admitted for osteomyelitis and aspiration pneumonia.    #Aspiration Pneumonia #Loculated left pleural effusion s/p chest tube #Worsening pneumonia of right lung He is breathing comfortably on 5L [place]. Chest tube has been removed by PCCM. ID has been consulted for ongoing antibiotic requirements. SLP following with goal to achieve oral intake and reduce risk for silent aspiration.  -Antibiotics to treat CAP completed 7/31, Daptomycin and ceftriaxone continued for osteomyelitis -Repeat CXR 7/31 shows significant improvement in bilateral airspace  opacities -Repeat pleural fluid cytology still pending -PCCM following  #Oral/Esophageal Thrush #Acute on chronic dysphagia Somewhat improved oral thrush and dysphagia. Receiving tube feeds through a CorTrack. Mag and Phos remain WNL. No new changes in management.  -Swallow study 7/31 shows improvement from previous, SLP recommends full liquid diet -Per dietician, will keep CorTrack in place with continued tube feeds until patient can consistently consume 60% of calories PO -Fluconazole 200mg  IV daily, ending 8/9  #Osteomyelitis of the right greater trochanter #R hip decubitus ulcer Chronic R hip decubitus ulcer with new osteomyelitis of the right greater trochanter. On week 2 of 6 week abx course -Daptomycin 8mg /kg and Ceftriaxone 2g started 8/1 per ID to complete 6 week abx course, ending Sep 5 -Transitioned to oral pain medication, dilaudid 2mg  scheduled q6, PRN dilaudid 1mg  q4 -Frequent offloading and turning -Dressing changes 3 times daily per general surgery recommendations with dry kerlix gauze, medihoney or santyl  #Elevation of ALT, AST Labs 8/2 show modest increase of baseline ALT and AST to 130s. CT Abdomen from 6/27 shows multiple subcentimeter low-density lesions, favored to be small cysts or hemangiomas. No new abdominal pain, no new hepatotoxic mediations  -PT/INR at baseline -RUQ ultrasound redemonstrates cyst-like lesions with unremarkable gallbladder and normal liver parenchyma -Will continue to monitor with CMP -Check CK level  #Severe deconditioning and weakness #Severe malnutrition in setting of acute illness We are increasingly concerned about his overall picture and poor prognosis given his severe deconditioning, acute on chronic dysphagia, continued infections despite antibiotic use, poor wound healing, and refusal to participate in therapies. We will continue to engage with him and his brother about goals of care discussions moving forward.   #Type 2 Diabetes  Mellitus with Hyperglycemia - SSI- M -Semglee 5  Diet: Fluids, Tube feeds IVF: None,None VTE: Enoxaparin Code: DNR PT/OT recs: SNF for Subacute PT,  Family Update: brother [name], to be notified   Dispo: Anticipated discharge to Skilled nursing facility pending medical stability.   Monna Fam , MD PGY-1 Internal Medicine Resident Pager Number [phone]    Monna Fam, MD

## 2023-07-24 NOTE — Progress Notes (Signed)
Visited patient at bedside after page. Pt complaining of dyspnea. It awoke him from sleep around 0330. He also is reporting lightheadedness. States this happened one week ago (during hospitalization) and that nothing helped it, though it went away on its own. He is saturating well, 100%. Respiratory therapy came by an hour ago and he refused duonebs, as they did not help him last time. They did turn O2 to 6L [place]. He states that helped somewhat. On exam, lungs are clear, no respiratory distress. He does report anxiety which started after dyspnea. All in all, vitals are reassuring as he remained well saturated even before increasing O2. After discussion, he is agreeable to giving duo nebs a try. PLAN: duonebs treatment

## 2023-07-24 NOTE — Progress Notes (Signed)
NIF -25 w/good pt effort BS clear & diminished in LLL RR 28 O2 Sats 100% on 6L [place]

## 2023-07-24 NOTE — Progress Notes (Signed)
Pt c/o of restless leg bilaterally and states the muscle relaxer does not help as much and would like to try ropinirole.

## 2023-07-25 DIAGNOSIS — L089 Local infection of the skin and subcutaneous tissue, unspecified: Secondary | ICD-10-CM | POA: Diagnosis not present

## 2023-07-25 DIAGNOSIS — L8994 Pressure ulcer of unspecified site, stage 4: Secondary | ICD-10-CM | POA: Diagnosis not present

## 2023-07-25 DIAGNOSIS — J690 Pneumonitis due to inhalation of food and vomit: Secondary | ICD-10-CM | POA: Diagnosis not present

## 2023-07-25 LAB — GLUCOSE, CAPILLARY
Glucose-Capillary: 108 mg/dL — ABNORMAL HIGH (ref 70–99)
Glucose-Capillary: 114 mg/dL — ABNORMAL HIGH (ref 70–99)
Glucose-Capillary: 125 mg/dL — ABNORMAL HIGH (ref 70–99)
Glucose-Capillary: 165 mg/dL — ABNORMAL HIGH (ref 70–99)
Glucose-Capillary: 89 mg/dL (ref 70–99)

## 2023-07-25 NOTE — Progress Notes (Signed)
Calorie for day 1 on door.  Patient also had a bag of snacks brought from home. I seen popcorn, snickers, reese cup wrappers on his bed. Told him the risks of choking/aspirating. Patient stated he had no problem eating it. I moved bag away from patient until he can be reevaluated.

## 2023-07-25 NOTE — Plan of Care (Signed)

## 2023-07-25 NOTE — Progress Notes (Signed)
Brief Nutrition Note  Received page via on-call pager. Per internal medicine MD, major concern at this time is patient's nutrition.  Patient on a full liquid diet and ordered Ensure, Magic Cup, and Juven . No meal intakes documented since 8/1, which was 50% of dinner. Patient also with Cortrak and receiving Pivot 1.5 at 25mL/hr.   Patient being followed by RD, last note 8/1 which indicates plan to continue TF until patient able to consistently eat >60% of estimated needs. Per discussion with MD, will plan for formal calorie count to determine PO intake and ability to stop TF.   Plans/Interventions: - 48 hour calorie count to start today, 8/3 and run through 8/5.  - Please document % intake of all foods, drinks, and nutrition supplements patient consumes on meal tickets and place in envelope on patient's door.  - If patient skips/refuses meals please document 0% for that meal.  - Discussed with RN.  - Full Liquid diet. - Ensure Enlive po BID, each supplement provides 350 kcal and 20 grams of protein. - Juven BID, each packet provides 95 calories, 2.5 grams of protein (collagen), and 9.8 grams of carbohydrate (3 grams sugar). - Magic cup TID with meals, each supplement provides 290 kcal and 9 grams of protein - Tube Feeds via Cortrak: Pivot 1.5 at 60 mL/hr (1440 mL per day) Regimen provides 2160 kcal, 135 gm protein, and 1080 mL free water daily. Continuing TF until pt abe to consistently eat >60% of estimated needs - Multivitamin w/ minerals daily - Banatrol TF - TID    Shelle Iron RD, LDN For contact information, refer to [facility].

## 2023-07-25 NOTE — Progress Notes (Signed)
Blood glucose 89. Holding all insulin this morning. Notified MD

## 2023-07-25 NOTE — Progress Notes (Signed)
Pt refused all morning medications, bath, dressing changes, and mobility throughout night. Educated pt on the importance of information stated above and encouraged pt the benefit of this and pt stated "please just leave me alone". Came back at later times throughout night and was still given same response. Offered refreshments to encourage patient and pt still refused.

## 2023-07-25 NOTE — Progress Notes (Signed)
Messaged attending about tube feedings. According to the [facility] there was no tube feedings since 07/23/23. Seen where TF was still placed in [facility] and orders. Verifying with MD.

## 2023-07-25 NOTE — Progress Notes (Signed)
Patient received a bath with the help of the CNA. Changed bandages and bath was completed.

## 2023-07-25 NOTE — Progress Notes (Signed)
Subjective:   Summary: [name] is a 67 y.o. year old male with history significant for severe deconditioning/functional paraplegia and malnutrition, currently admitted on the IMTS HD#25 for osteomyelitis of the right greater trochanter and aspiration pneumonia.  Overnight Events:  Pt seen at bedside this AM. States he is dong well, and feels like his breathing is improving. He states he has been trying to consume as many nutrients as possible via ensure.    Objective:  Vital signs in last 24 hours: Vitals:   07/24/23 1634 07/24/23 2352 07/25/23 0325 07/25/23 0727  BP: (!) 126/57 (!) 142/57 137/61   Pulse: 86 93 91   Resp: 17 19 19    Temp: (!) 97.4 F (36.3 C) 99.1 F (37.3 C) 98.4 F (36.9 C)   TempSrc: Oral Oral Oral   SpO2: 96% 97% 99%   Weight:    91 kg  Height:       Supplemental O2: Nasal Cannula SpO2: 99 % O2 Flow Rate (L/min): 4 L/min   Physical Exam:  Constitutional: chronically ill-appearing, laying in bed, in no acute distress Cardiovascular: RRR, no murmurs, rubs or gallops Pulmonary/Chest: Normal respiratory rate, no accessory muscle use. CTA bilaterally but exam limited by patient immobility Abdominal: soft, non-tender, non-distended Skin: warm and dry   Filed Weights   07/21/23 0343 07/22/23 0447 07/25/23 0727  Weight: 89 kg 89 kg 91 kg     Intake/Output Summary (Last 24 hours) at 07/25/2023 0943 Last data filed at 07/24/2023 2353 Gross per 24 hour  Intake --  Output 1850 ml  Net -1850 ml   Net IO Since Admission: -867.53 mL [07/25/23 0943]  Pertinent Labs:    Latest Ref Rng & Units 07/25/2023    4:29 AM 07/24/2023   12:29 AM 07/23/2023    5:03 AM  CBC  WBC 4.0 - 10.5 K/uL 7.6  9.4  7.8   Hemoglobin 13.0 - 17.0 g/dL 7.4  7.3  7.5   Hematocrit 39.0 - 52.0 % 23.9  23.6  24.6   Platelets 150 - 400 K/uL 529  603  608        Latest Ref Rng & Units 07/25/2023    4:29 AM 07/24/2023   12:29 AM 07/23/2023    4:00 PM  CMP   Glucose 70 - 99 mg/dL 829  562  130   BUN 8 - 23 mg/dL 14  22    Creatinine 8.65 - 1.24 mg/dL 7.84  6.96    Sodium 295 - 145 mmol/L 133  135    Potassium 3.5 - 5.1 mmol/L 4.2  3.9    Chloride 98 - 111 mmol/L 98  98    CO2 22 - 32 mmol/L 30  32    Calcium 8.9 - 10.3 mg/dL 8.1  7.8    Total Protein 6.5 - 8.1 g/dL 5.6  5.2    Total Bilirubin 0.3 - 1.2 mg/dL 0.4  0.5    Alkaline Phos 38 - 126 U/L 85  94    AST 15 - 41 U/L 78  129    ALT 0 - 44 U/L 111  144      Assessment/Plan:   Principal Problem:   Infected decubitus ulcer Active Problems:   Type 2 diabetes mellitus with hyperglycemia (HCC)   Protein-calorie malnutrition, severe   Osteomyelitis of right femur (HCC)   Goals of care, counseling/discussion   Osteomyelitis (  HCC)   Aspiration pneumonia of left lower lobe (HCC)   Parapneumonic effusion   Need for management of chest tube   Pleural effusion   Pneumonia of right lung due to infectious organism   Empyema (HCC)   Oropharyngeal candidiasis   Patient Summary: [name] is a 67 y.o. with a pertinent PMH of functional paraplegia, who presented with infected decubitus ulcer/osteomyelitis and admitted for osteomyelitis and aspiration pneumonia.    #Aspiration Pneumonia #Loculated left pleural effusion s/p chest tube #Worsening pneumonia of right lung He is breathing comfortably on 4L [place]. Chest tube has been removed by PCCM. ID has been consulted for ongoing antibiotic requirements. SLP following with goal to achieve oral intake and reduce risk for silent aspiration.  -Antibiotics to treat CAP completed 7/31, Daptomycin and ceftriaxone continued for osteomyelitis until 8/9 per ID -Repeat CXR 7/31 shows significant improvement in bilateral airspace opacities -Repeat pleural fluid cytology still pending -PCCM following  #Oral/Esophageal Thrush #Acute on chronic dysphagia Somewhat improved oral thrush and dysphagia. Receiving tube feeds through a CorTrack. Mag and  Phos remain WNL. No new changes in management.  -Swallow study 7/31 shows improvement from previous, SLP recommends full liquid diet -Per dietician, will keep CorTrack in place with continued tube feeds until patient can consistently consume 60% of calories PO, currently around 50% -Fluconazole 200mg  IV daily, ending 8/9  #Osteomyelitis of the right greater trochanter #R hip decubitus ulcer Chronic R hip decubitus ulcer with new osteomyelitis of the right greater trochanter. On week 2 of 6 week abx course -Daptomycin 8mg /kg and Ceftriaxone 2g started 8/1 per ID to complete 6 week abx course, ending Sep 5 -Transitioned to oral pain medication, dilaudid 2mg  scheduled q6, PRN dilaudid 1mg  q4 -Frequent offloading and turning -Dressing changes 3 times daily per general surgery recommendations with dry kerlix gauze, medihoney or santyl  #Elevation of ALT, AST Resolving, could have been secondary to antibiotic switch. ALT and AST both returning to normal limits, will continue to monitor. CK within normal limits.    #Severe deconditioning and weakness #Severe malnutrition in setting of acute illness We are increasingly concerned about his overall picture and poor prognosis given his severe deconditioning, acute on chronic dysphagia, continued infections despite antibiotic use, poor wound healing, and refusal to participate in therapies. We will continue to engage with him and his brother about goals of care discussions moving forward. Will also reach to social work to determine best disposition plan.   #Type 2 Diabetes Mellitus with Hyperglycemia - SSI- M -Semglee 5  Diet: Fluids, Tube feeds IVF: None,None VTE: Enoxaparin Code: DNR PT/OT recs: SNF for Subacute PT,  Family Update: brother [name], to be notified   Dispo: Anticipated discharge to Skilled nursing facility pending medical stability.   Olegario Messier, MD PGY-2 Internal Medicine Resident Pager Number [phone]    Olegario Messier, MD

## 2023-07-26 DIAGNOSIS — J690 Pneumonitis due to inhalation of food and vomit: Secondary | ICD-10-CM | POA: Diagnosis not present

## 2023-07-26 DIAGNOSIS — L8994 Pressure ulcer of unspecified site, stage 4: Secondary | ICD-10-CM | POA: Diagnosis not present

## 2023-07-26 DIAGNOSIS — L089 Local infection of the skin and subcutaneous tissue, unspecified: Secondary | ICD-10-CM | POA: Diagnosis not present

## 2023-07-26 LAB — GLUCOSE, CAPILLARY
Glucose-Capillary: 110 mg/dL — ABNORMAL HIGH (ref 70–99)
Glucose-Capillary: 144 mg/dL — ABNORMAL HIGH (ref 70–99)
Glucose-Capillary: 147 mg/dL — ABNORMAL HIGH (ref 70–99)
Glucose-Capillary: 159 mg/dL — ABNORMAL HIGH (ref 70–99)
Glucose-Capillary: 220 mg/dL — ABNORMAL HIGH (ref 70–99)
Glucose-Capillary: 226 mg/dL — ABNORMAL HIGH (ref 70–99)
Glucose-Capillary: 252 mg/dL — ABNORMAL HIGH (ref 70–99)

## 2023-07-26 MED ORDER — INSULIN GLARGINE-YFGN 100 UNIT/ML ~~LOC~~ SOLN
10.0000 [IU] | Freq: Every day | SUBCUTANEOUS | Status: DC
  Administered 2023-07-26 – 2023-07-27 (×2): 10 [IU] via SUBCUTANEOUS
  Filled 2023-07-26 (×3): qty 0.1

## 2023-07-26 NOTE — Plan of Care (Signed)

## 2023-07-26 NOTE — Progress Notes (Incomplete)
Patient found with snickers at bedside. I educated patient on risks of choking. He states "I dont care give me my damn snickers."

## 2023-07-26 NOTE — Progress Notes (Signed)
Patient found with snickers and reese's cup at bedside. I educated patient on risks of choking and removed candy from bedside table. He states "I don't give a damn, get the fuck out."

## 2023-07-26 NOTE — Progress Notes (Signed)
Patients wound care done. Bath and sheets done. Call light in reach.

## 2023-07-26 NOTE — Progress Notes (Addendum)
Subjective:   Summary: [name] is a 67 y.o. year old male with history significant for severe deconditioning/functional paraplegia and malnutrition, currently admitted on the IMTS HD#26 for osteomyelitis of the right greater trochanter and aspiration pneumonia.  Overnight Events: None  Pt seen at bedside this AM. States he is dong well. Notes some mild shortness of breath. Denies any spitting up or choking. States he has been eating and wants the tube out of his nose. Motivated to get discharged from hospital. Encouraged continued oral intake. Pain is stable.    Objective:  Vital signs in last 24 hours: Vitals:   07/25/23 2006 07/26/23 0105 07/26/23 0516 07/26/23 0802  BP: (!) 146/67  (!) 150/55 (!) 152/64  Pulse: 97  (!) 111 (!) 104  Resp: 20  19 18   Temp: 98.5 F (36.9 C)  98.1 F (36.7 C) 97.9 F (36.6 C)  TempSrc: Oral  Oral Oral  SpO2: 100% 100% 100% 97%  Weight:      Height:       Supplemental O2: Nasal Cannula SpO2: 97 % O2 Flow Rate (L/min): 4 L/min   Physical Exam:  Constitutional: chronically ill-appearing, laying in bed, in no acute distress Cardiovascular: RRR, no murmurs, rubs or gallops Pulmonary/Chest: Normal respiratory rate, no accessory muscle use. CTA bilaterally but exam limited by patient immobility Abdominal: soft, non-tender, non-distended Skin: warm and dry   Filed Weights   07/21/23 0343 07/22/23 0447 07/25/23 0727  Weight: 89 kg 89 kg 91 kg     Intake/Output Summary (Last 24 hours) at 07/26/2023 0911 Last data filed at 07/26/2023 0058 Gross per 24 hour  Intake 75 ml  Output 825 ml  Net -750 ml   Net IO Since Admission: -1,617.53 mL [07/26/23 0911]  Pertinent Labs:    Latest Ref Rng & Units 07/25/2023    4:29 AM 07/24/2023   12:29 AM 07/23/2023    5:03 AM  CBC  WBC 4.0 - 10.5 K/uL 7.6  9.4  7.8   Hemoglobin 13.0 - 17.0 g/dL 7.4  7.3  7.5   Hematocrit 39.0 - 52.0 % 23.9  23.6  24.6   Platelets 150 - 400 K/uL  529  603  608        Latest Ref Rng & Units 07/25/2023    4:29 AM 07/24/2023   12:29 AM 07/23/2023    4:00 PM  CMP  Glucose 70 - 99 mg/dL 161  096  045   BUN 8 - 23 mg/dL 14  22    Creatinine 4.09 - 1.24 mg/dL 8.11  9.14    Sodium 782 - 145 mmol/L 133  135    Potassium 3.5 - 5.1 mmol/L 4.2  3.9    Chloride 98 - 111 mmol/L 98  98    CO2 22 - 32 mmol/L 30  32    Calcium 8.9 - 10.3 mg/dL 8.1  7.8    Total Protein 6.5 - 8.1 g/dL 5.6  5.2    Total Bilirubin 0.3 - 1.2 mg/dL 0.4  0.5    Alkaline Phos 38 - 126 U/L 85  94    AST 15 - 41 U/L 78  129    ALT 0 - 44 U/L 111  144      Assessment/Plan:   Principal Problem:   Infected decubitus ulcer Active Problems:   Type 2 diabetes mellitus with hyperglycemia (HCC)  Protein-calorie malnutrition, severe   Osteomyelitis of right femur (HCC)   Goals of care, counseling/discussion   Osteomyelitis (HCC)   Aspiration pneumonia of left lower lobe (HCC)   Parapneumonic effusion   Need for management of chest tube   Pleural effusion   Pneumonia of right lung due to infectious organism   Empyema (HCC)   Oropharyngeal candidiasis   Patient Summary: [name] is a 67 y.o. with a pertinent PMH of functional paraplegia, who presented with infected decubitus ulcer/osteomyelitis and admitted for osteomyelitis and aspiration pneumonia.    #Aspiration Pneumonia #Loculated left pleural effusion s/p chest tube #Worsening pneumonia of right lung He is breathing comfortably on 4L [place]. Chest tube has been removed by PCCM. ID has been consulted for ongoing antibiotic requirements. SLP following with goal to achieve oral intake and reduce risk for silent aspiration.  -Antibiotics to treat CAP completed 7/31, Daptomycin and ceftriaxone continued for osteomyelitis until 8/9 per ID -Repeat CXR 7/31 shows significant improvement in bilateral airspace opacities -Repeat pleural fluid cytology still pending -PCCM following  #Oral/Esophageal Thrush #Acute  on chronic dysphagia Somewhat improved oral thrush and dysphagia. Receiving tube feeds through a CorTrack. Mag and Phos remain WNL. No new changes in management.  -Swallow study 7/31 shows improvement from previous, SLP recommends full liquid diet -Per dietician, will keep CorTrack in place with continued tube feeds until patient can consistently consume 60% of calories PO, currently around 50% -Fluconazole 200mg  IV daily, ending 8/9  #Osteomyelitis of the right greater trochanter #R hip decubitus ulcer Chronic R hip decubitus ulcer with new osteomyelitis of the right greater trochanter. On week 2 of 6 week abx course -Daptomycin 8mg /kg and Ceftriaxone 2g started 8/1 per ID to complete 6 week abx course, ending Sep 5 -Transitioned to oral pain medication, dilaudid 2mg  scheduled q6, PRN dilaudid 1mg  q4 -Frequent offloading and turning -Dressing changes 3 times daily per general surgery recommendations with dry kerlix gauze, medihoney or santyl  #Elevation of ALT, AST Resolving, could have been secondary to antibiotic switch. ALT and AST both returning to normal limits, will continue to monitor. CK within normal limits.   #Severe deconditioning and weakness #Severe malnutrition in setting of acute illness We are increasingly concerned about his overall picture and poor prognosis given his severe deconditioning, acute on chronic dysphagia, continued infections despite antibiotic use, poor wound healing, and refusal to participate in therapies. We will continue to engage with him and his brother about goals of care discussions moving forward. Will also reach to social work to determine best disposition plan.   #Type 2 Diabetes Mellitus with Hyperglycemia - SSI- M -Semglee 10  Diet: Fluids, Tube feeds IVF: None,None VTE: Enoxaparin Code: DNR PT/OT recs: SNF for Subacute PT,  Family Update: brother [name], to be notified   Dispo: Anticipated discharge to Skilled nursing facility pending  medical stability.   Monna Fam, MD PGY-1 Internal Medicine Resident Pager Number [phone]   Monna Fam, MD

## 2023-07-27 LAB — GLUCOSE, CAPILLARY
Glucose-Capillary: 152 mg/dL — ABNORMAL HIGH (ref 70–99)
Glucose-Capillary: 169 mg/dL — ABNORMAL HIGH (ref 70–99)
Glucose-Capillary: 178 mg/dL — ABNORMAL HIGH (ref 70–99)
Glucose-Capillary: 159 mg/dL — ABNORMAL HIGH (ref 70–99)
Glucose-Capillary: 124 mg/dL — ABNORMAL HIGH (ref 70–99)

## 2023-07-27 LAB — BPAM RBC
Blood Product Expiration Date: 202408102359
ISSUE DATE / TIME: 202408051300
Unit Type and Rh: 7300

## 2023-07-27 LAB — TYPE AND SCREEN
ABO/RH(D): B POS
Antibody Screen: NEGATIVE
Unit division: 0

## 2023-07-27 LAB — HEMOGLOBIN AND HEMATOCRIT, BLOOD
HCT: 26.7 % — ABNORMAL LOW (ref 39.0–52.0)
Hemoglobin: 8.3 g/dL — ABNORMAL LOW (ref 13.0–17.0)

## 2023-07-27 LAB — PREPARE RBC (CROSSMATCH)

## 2023-07-27 MED ORDER — THIAMINE MONONITRATE 100 MG PO TABS
100.0000 mg | ORAL_TABLET | Freq: Every day | ORAL | Status: DC
  Administered 2023-07-28: 100 mg via ORAL
  Filled 2023-07-27: qty 1

## 2023-07-27 MED ORDER — METHOCARBAMOL 500 MG PO TABS
500.0000 mg | ORAL_TABLET | Freq: Four times a day (QID) | ORAL | Status: DC
  Administered 2023-07-27 – 2023-07-28 (×5): 500 mg via ORAL
  Filled 2023-07-27 (×5): qty 1

## 2023-07-27 MED ORDER — BANATROL TF EN LIQD: 60.0000 mL | Freq: Three times a day (TID) | ENTERAL | Status: DC

## 2023-07-27 MED ORDER — SENNOSIDES-DOCUSATE SODIUM 8.6-50 MG PO TABS: 1.0000 | ORAL_TABLET | Freq: Every day | ORAL | Status: DC

## 2023-07-27 MED ORDER — LOSARTAN POTASSIUM 25 MG PO TABS
25.0000 mg | ORAL_TABLET | Freq: Every day | ORAL | Status: DC
  Administered 2023-07-28: 25 mg via ORAL
  Filled 2023-07-27: qty 1

## 2023-07-27 MED ORDER — BUSPIRONE HCL 5 MG PO TABS
5.0000 mg | ORAL_TABLET | Freq: Three times a day (TID) | ORAL | Status: DC
  Administered 2023-07-27 – 2023-07-28 (×3): 5 mg via ORAL
  Filled 2023-07-27 (×3): qty 1

## 2023-07-27 MED ORDER — SODIUM CHLORIDE 0.9% IV SOLUTION: Freq: Once | INTRAVENOUS | Status: AC

## 2023-07-27 MED ORDER — HYDROMORPHONE HCL 1 MG/ML IJ SOLN
0.5000 mg | Freq: Once | INTRAMUSCULAR | Status: AC
  Administered 2023-07-27: 0.5 mg via INTRAVENOUS
  Filled 2023-07-27: qty 0.5

## 2023-07-27 MED ORDER — GABAPENTIN 400 MG PO CAPS
800.0000 mg | ORAL_CAPSULE | Freq: Three times a day (TID) | ORAL | Status: DC
  Administered 2023-07-27 – 2023-07-28 (×3): 800 mg via ORAL
  Filled 2023-07-27 (×3): qty 2

## 2023-07-27 MED ORDER — PANTOPRAZOLE SODIUM 40 MG IV SOLR
40.0000 mg | Freq: Two times a day (BID) | INTRAVENOUS | Status: DC
  Administered 2023-07-27 – 2023-07-28 (×3): 40 mg via INTRAVENOUS
  Filled 2023-07-27 (×3): qty 10

## 2023-07-27 MED ORDER — HYDROXYZINE HCL 25 MG PO TABS
25.0000 mg | ORAL_TABLET | Freq: Three times a day (TID) | ORAL | Status: DC | PRN
  Administered 2023-07-27: 25 mg via ORAL
  Filled 2023-07-27: qty 1

## 2023-07-27 MED ORDER — SENNOSIDES-DOCUSATE SODIUM 8.6-50 MG PO TABS
2.0000 | ORAL_TABLET | Freq: Every day | ORAL | Status: DC
  Administered 2023-07-27: 2 via ORAL
  Filled 2023-07-27: qty 2

## 2023-07-27 MED ORDER — JUVEN PO PACK
1.0000 | PACK | Freq: Two times a day (BID) | ORAL | Status: DC
  Administered 2023-07-28 (×2): 1 via ORAL
  Filled 2023-07-27 (×2): qty 1

## 2023-07-27 MED ORDER — VITAMIN C 500 MG PO TABS
500.0000 mg | ORAL_TABLET | Freq: Every day | ORAL | Status: DC
  Administered 2023-07-28: 500 mg via ORAL
  Filled 2023-07-27: qty 1

## 2023-07-27 MED ORDER — ENSURE ENLIVE PO LIQD
237.0000 mL | Freq: Three times a day (TID) | ORAL | Status: DC
  Administered 2023-07-27 – 2023-07-28 (×4): 237 mL via ORAL

## 2023-07-27 MED ORDER — ACETAMINOPHEN 160 MG/5ML PO SOLN
650.0000 mg | Freq: Four times a day (QID) | ORAL | Status: DC
  Filled 2023-07-27: qty 20.3

## 2023-07-27 MED ORDER — ADULT MULTIVITAMIN W/MINERALS CH
1.0000 | ORAL_TABLET | Freq: Every day | ORAL | Status: DC
  Administered 2023-07-28: 1 via ORAL
  Filled 2023-07-27: qty 1

## 2023-07-27 NOTE — Progress Notes (Signed)
Subjective:   Summary: [name] is a 67 y.o. year old male with history significant for severe deconditioning/functional paraplegia and malnutrition, currently admitted on the IMTS HD#27 for osteomyelitis of the right greater trochanter and aspiration pneumonia.  Overnight Events: None  Pt seen at bedside this AM. Hgb level this morning was 6.7, patient received 1 unit pRBC. Patient was upset today. Stated his legs were uncomfortable, he dislikes having his CorTrack, and wants to be out of the hospital. Nursing note from last night states that patient was verbally aggressive toward nursing for removing food from patient's bedside. Concerns were addressed.    Objective:  Vital signs in last 24 hours: Vitals:   07/26/23 2053 07/27/23 0411 07/27/23 0707 07/27/23 0737  BP: (!) 154/62 (!) 122/51  126/61  Pulse: 93 (!) 101  92  Resp: 19 19  17   Temp: 97.6 F (36.4 C) 98.2 F (36.8 C)  97.7 F (36.5 C)  TempSrc: Oral Oral  Oral  SpO2: 100% 98%  97%  Weight:   91 kg   Height:       Supplemental O2: Nasal Cannula SpO2: 97 % O2 Flow Rate (L/min): 4 L/min   Physical Exam:  Constitutional: chronically ill-appearing, laying in bed, in no acute distress Cardiovascular: RRR, no murmurs, rubs or gallops Pulmonary/Chest: Normal respiratory rate, no accessory muscle use. CTA bilaterally but exam limited by patient immobility Abdominal: soft, non-tender, non-distended Skin: warm and dry   Filed Weights   07/22/23 0447 07/25/23 0727 07/27/23 0707  Weight: 89 kg 91 kg 91 kg     Intake/Output Summary (Last 24 hours) at 07/27/2023 1029 Last data filed at 07/27/2023 0434 Gross per 24 hour  Intake --  Output 1250 ml  Net -1250 ml   Net IO Since Admission: -2,867.53 mL [07/27/23 1029]  Pertinent Labs:    Latest Ref Rng & Units 07/27/2023    5:30 AM 07/25/2023    4:29 AM 07/24/2023   12:29 AM  CBC  WBC 4.0 - 10.5 K/uL 8.1  7.6  9.4   Hemoglobin 13.0 - 17.0 g/dL  6.7  7.4  7.3   Hematocrit 39.0 - 52.0 % 22.1  23.9  23.6   Platelets 150 - 400 K/uL 457  529  603        Latest Ref Rng & Units 07/27/2023    5:30 AM 07/25/2023    4:29 AM 07/24/2023   12:29 AM  CMP  Glucose 70 - 99 mg/dL 540  981  191   BUN 8 - 23 mg/dL 19  14  22    Creatinine 0.61 - 1.24 mg/dL 4.78  2.95  6.21   Sodium 135 - 145 mmol/L 134  133  135   Potassium 3.5 - 5.1 mmol/L 4.0  4.2  3.9   Chloride 98 - 111 mmol/L 99  98  98   CO2 22 - 32 mmol/L 28  30  32   Calcium 8.9 - 10.3 mg/dL 7.9  8.1  7.8   Total Protein 6.5 - 8.1 g/dL 5.4  5.6  5.2   Total Bilirubin 0.3 - 1.2 mg/dL 0.2  0.4  0.5   Alkaline Phos 38 - 126 U/L 66  85  94   AST 15 - 41 U/L 69  78  129   ALT 0 - 44 U/L 83  111  144     Assessment/Plan:  Principal Problem:   Infected decubitus ulcer Active Problems:   Type 2 diabetes mellitus with hyperglycemia (HCC)   Protein-calorie malnutrition, severe   Osteomyelitis of right femur (HCC)   Goals of care, counseling/discussion   Osteomyelitis (HCC)   Aspiration pneumonia of left lower lobe (HCC)   Parapneumonic effusion   Need for management of chest tube   Pleural effusion   Pneumonia of right lung due to infectious organism   Empyema (HCC)   Oropharyngeal candidiasis   Patient Summary: [name] is a 67 y.o. with a pertinent PMH of functional paraplegia, who presented with infected decubitus ulcer/osteomyelitis and admitted for osteomyelitis and aspiration pneumonia.    #Aspiration Pneumonia #Loculated left pleural effusion s/p chest tube He is breathing comfortably on 4L [place]. Chest tube has been removed by PCCM. ID has been consulted for ongoing antibiotic requirements. SLP following with goal to achieve oral intake and reduce risk for silent aspiration.  -Antibiotics to treat CAP completed 7/31, Daptomycin and ceftriaxone continued for osteomyelitis until 9/5 per ID -Repeat CXR 7/31 shows significant improvement in bilateral airspace  opacities -Repeat pleural fluid cytology still pending -PCCM following  #Oral/Esophageal Thrush #Acute on chronic dysphagia Improving oral thrush and dysphagia. Patient received tube feeds through CorTrack until d/ced on 8/5. Mag and Phos remain WNL. No new changes in management.  -Swallow study 7/31 shows improvement from previous, SLP recommends regular diet -CorTrack removed 8/5. Patient can get sufficient calories from PO intake -Fluconazole 200mg  IV daily, ending 8/9  #Osteomyelitis of the right greater trochanter #R hip decubitus ulcer Chronic R hip decubitus ulcer with new osteomyelitis of the right greater trochanter. On week 2 of 6 week abx course -Daptomycin 8mg /kg and Ceftriaxone 2g started 8/1 per ID to complete 6 week abx course, ending 9/5 -Transitioned to oral pain medication, dilaudid 2mg  scheduled q6, PRN dilaudid 1mg  q4 -Frequent offloading and turning -Dressing changes 3 times daily per general surgery recommendations with dry kerlix gauze, medihoney or santyl  #Elevation of ALT, AST Resolving, could have been secondary to antibiotic switch. ALT and AST both returning to normal limits, will continue to monitor. CK within normal limits.   #Severe deconditioning and weakness #Severe malnutrition in setting of acute illness We are increasingly concerned about his overall picture and poor prognosis given his severe deconditioning, acute on chronic dysphagia, continued infections despite antibiotic use, poor wound healing, and refusal to participate in therapies. We will continue to engage with him and his brother about goals of care discussions moving forward. Will also reach to social work to determine best disposition plan.   #Type 2 Diabetes Mellitus with Hyperglycemia - SSI- M -Semglee 10  Diet: Fluids, Tube feeds IVF: None,None VTE: Enoxaparin Code: DNR PT/OT recs: SNF for Subacute PT,  Family Update: brother [name], to be notified   Dispo: Anticipated  discharge to Skilled nursing facility pending medical stability.   Monna Fam, MD PGY-1 Internal Medicine Resident Pager Number [phone]   Monna Fam, MD

## 2023-07-27 NOTE — Progress Notes (Signed)
Consent is signed for blood transfusion. On the chart.

## 2023-07-27 NOTE — Progress Notes (Signed)
Patient states that his pain is not being well managed. Also, he was having anxiety. Messaged MD

## 2023-07-27 NOTE — Progress Notes (Signed)
Patient received bath and wound care has been completed.

## 2023-07-27 NOTE — Progress Notes (Signed)
Dressing change done

## 2023-07-27 NOTE — TOC Progression Note (Addendum)
Transition of Care [facility]) - Progression Note    Patient Details  Name: KAIRI OCASIO MRN: [id number] Date of Birth: October 10, 1956  [facility]) CM/SW Contact  Delilah Shan, LCSWA Phone Number: 07/27/2023, 3:53 PM  Clinical Narrative:     CSW started insurance authorization for patient Auth ID# [id number]. Insurance authorization currently pending for SNF. CSW will continue to follow.  Expected Discharge Plan: Skilled Nursing Facility Barriers to Discharge: Continued Medical Work up  Expected Discharge Plan and Services                                               Social Determinants of Health (SDOH) Interventions SDOH Screenings   Food Insecurity: No Food Insecurity (06/30/2023)  Recent Concern: Food Insecurity - Food Insecurity Present (06/24/2023)  Housing: Low Risk  (06/30/2023)  Transportation Needs: No Transportation Needs (06/30/2023)  Utilities: Not At Risk (06/30/2023)  Tobacco Use: High Risk (06/29/2023)    Readmission Risk Interventions    06/22/2023    4:22 PM 05/04/2023    1:50 PM  Readmission Risk Prevention Plan  Transportation Screening Complete Complete  PCP or Specialist Appt within 5-7 Days  Complete  PCP or Specialist Appt within 3-5 Days Complete   Home Care Screening  Complete  Medication Review (RN CM)  Referral to Pharmacy  HRI or Home Care Consult Complete   Social Work Consult for Recovery Care Planning/Counseling Complete   Palliative Care Screening Complete   Medication Review [employer]) Complete

## 2023-07-27 NOTE — Progress Notes (Signed)
Physical Therapy Treatment Patient Details Name: BERTIS DIEGO MRN: [id number] DOB: [birth year] Today's Date: 07/27/2023   History of Present Illness 67 yo male admitted on 7/9 with decubitus ulcer; pt and brother unable to manage at home; recent admission, dc'd 7/8, returned on 7/9; Hydrotherapy initiated; 7/15 difficulty breathing with pleural effusion, L chest tube placed; with PMH of hypertension, gastric ulcers, severe deconditioning, left femoral fracture s/p IMN, previously admitted and treated for right hip osteomyelitis. Discharged with home health    PT Comments  Continuing work on functional mobility and activity tolerance;  Session focused on sitting balance/tolerance, and increasing use of UEs for repositioning and functional activities; BPs stayed stable as pt spent time in upright sitting; worked on pulling to unsupported sitting with bed in chair position and using UEs for pushing, pulling; setup pt to eat lunch sitting up; Slow, but notable progress with acute therapies; twice during session, pt mentioned wanting to get back to using his wheelchair to get around; emphasized to pt that the way to do that is to work on sitting up, sitting balance, and strengthening; Anticipate he may need a higher profile pressure redistribution cushion for his wheelchair; Patient will benefit from continued inpatient follow up therapy, <3 hours/day    If plan is discharge home, recommend the following: Assist for transportation;Help with stairs or ramp for entrance;Other (comment);Two people to help with walking and/or transfers;A lot of help with walking and/or transfers   Can travel by private vehicle     No  Equipment Recommendations  Other (comment) (May need a higher profile pressure redistribution cushion)    Recommendations for Other Services       Precautions / Restrictions Precautions Precautions: Fall Precaution Comments: fell in 03/2023 with L femur fx (missed w/c during transfer),  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) Restrictions Weight Bearing Restrictions: No     Mobility  Bed Mobility Overal bed mobility: Needs Assistance             General bed mobility comments: Used bed to chair position, as well as forward tipping (reverse trendelenburg positioning) to get pt into a more upright seated position; pt declining to get up to EOB    Transfers                   General transfer comment: Total assist for transfers since April;  Must also consider having an appropriate seating surface for pressure redistribution as we work towards pt spending more time OOB    Ambulation/Gait                   Stairs             Wheelchair Mobility     Tilt Bed    Modified Rankin (Stroke Patients Only)       Balance Overall balance assessment: Needs assistance Sitting-balance support: Bilateral upper extremity supported, Feet supported Sitting balance-Leahy Scale: Poor Sitting balance - Comments: Used bil bedrails to pull from supported sitting to unsupprted sitting with bed in chair  position; needed Mod assist to get to fully upright sitting and min to mod assist to help maintain unsupported sitting; notably less dizzy an ddistressed with upright and unsupported sitting than previous sessions                                    Cognition Arousal/Alertness: Awake/alert Behavior During Therapy: [facility]  for tasks assessed/performed Overall Cognitive Status: Within Functional Limits for tasks assessed                                          Exercises      General Comments General comments (skin integrity, edema, etc.): Session conducted on room air and O2 sats decr to 89% (observed lowest); re-started supplemental O2 at 2 L/min and sats maintained at 95%; RN notified      Pertinent Vitals/Pain Pain Assessment Pain Assessment: Faces Faces Pain Scale: Hurts little more Pain Location: legs Pain  Descriptors / Indicators: Discomfort Pain Intervention(s): Premedicated before session    Home Living                          Prior Function            PT Goals (current goals can now be found in the care plan section) Acute Rehab PT Goals Patient Stated Goal: wants to be out of the hospital PT Goal Formulation: With patient Time For Goal Achievement: 08/04/23 Potential to Achieve Goals: Poor Progress towards PT goals: Progressing toward goals (slowly)    Frequency    Min 1X/week      PT Plan Current plan remains appropriate    Co-evaluation              AM-PAC PT "6 Clicks" Mobility   Outcome Measure  Help needed turning from your back to your side while in a flat bed without using bedrails?: A Lot Help needed moving from lying on your back to sitting on the side of a flat bed without using bedrails?: Total Help needed moving to and from a bed to a chair (including a wheelchair)?: Total Help needed standing up from a chair using your arms (e.g., wheelchair or bedside chair)?: Total Help needed to walk in hospital room?: Total Help needed climbing 3-5 steps with a railing? : Total 6 Click Score: 7    End of Session Equipment Utilized During Treatment: Oxygen (adn bed position functions) Activity Tolerance: Patient limited by pain Patient left: in bed;with call bell/phone within reach (Patient eating lunch with bed in semi-chair position) Nurse Communication: Mobility status;Other (comment) (O2 sats on room air) PT Visit Diagnosis: Muscle weakness (generalized) (M62.81);History of falling (Z91.81);Difficulty in walking, not elsewhere classified (R26.2)     Time: 2130-8657 PT Time Calculation (min) (ACUTE ONLY): 21 min  Charges:    $Therapeutic Activity: 8-22 mins PT General Charges $$ ACUTE PT VISIT: 1 Visit                     Van Clines, PT  Acute Rehabilitation Services Office [phone] Secure Chat welcomed'   Levi Aland 07/27/2023, 3:01 PM

## 2023-07-27 NOTE — Plan of Care (Signed)
  Problem: Nutrition: Goal: Adequate nutrition will be maintained Outcome: Progressing   Problem: Pain Managment: Goal: General experience of comfort will improve Outcome: Progressing   Problem: Safety: Goal: Ability to remain free from injury will improve Outcome: Progressing   Problem: Skin Integrity: Goal: Risk for impaired skin integrity will decrease Outcome: Progressing   Problem: Education: Goal: Knowledge of General Education information will improve Description: Including pain rating scale, medication(s)/side effects and non-pharmacologic comfort measures Outcome: Progressing

## 2023-07-27 NOTE — Progress Notes (Signed)
Speech Language Pathology Treatment: Dysphagia  Patient Details Name: [name] MRN: [id number] DOB: 24-Oct-1956 Today's Date: 07/27/2023 Time: 4098-1191 SLP Time Calculation (min) (ACUTE ONLY): 8 min  Assessment / Plan / Recommendation Clinical Impression  Pt reclined in bed eating magic cup when therapist arrived and refused to be repositioned stating his legs hurt despite education. He stated "this tube IS coming out of my nose today". Session was limited and needed encouragement to eat graham cracker and drink water. MD had progressed texture to soft and stated SLP could advance if pt able. He masticated cracker timely x 2 trials without evidence of pocketing. There were no signs of aspiration with thin liquids via straw. Suspect pt will be able to eat enough to maintain nutrition although this is dietitian and MD scope. MD arrived as therapist completing session and updated with recommendations that therapist will upgrade to regular texture, continue thin liquids and consider removal of Cortrak. Hopefully his intake will improve now he is on regular texture. Encourage pt to sit up during meals. No further ST needed at this time.    HPI HPI: Pt is a 67 yo male presenting 7/8 with osteomyelitis of the right greater trochanter and aspiration pneumonia. Clinical SLP eval this admission was suggestive of functional swallowing (similar to presentation in 2021, at which time pt was also admitted with PNA). Instrumental testing was recommending given concern for silent aspiration but orders were d/c 7/13 when decision was made to pursue comfort measures. Pt rescinded comfort measures 7/14 and on 7/25 SLP was reconsulted due to concern for new symptoms of dysphagia/odynophagia (tx initiated for thrush). PMH includes: severe deconditioning/functional paraplegia and malnutrition, cervical fusion. MBS completed on 07/17/23 recommending continue NPO. Cortrak placed for nutrition.      SLP Plan  All goals  met;Discharge SLP treatment due to (comment)      Recommendations for follow up therapy are one component of a multi-disciplinary discharge planning process, led by the attending physician.  Recommendations may be updated based on patient status, additional functional criteria and insurance authorization.    Recommendations  Diet recommendations: Regular;Thin liquid Liquids provided via: Cup;Straw Medication Administration:  (as tolerated) Supervision: Patient able to self feed;Intermittent supervision to cue for compensatory strategies Compensations: Slow rate;Small sips/bites Postural Changes and/or Swallow Maneuvers: Seated upright 90 degrees                  Oral care BID   Frequent or constant Supervision/Assistance Dysphagia, pharyngoesophageal phase (R13.14)     All goals met;Discharge SLP treatment due to (comment)     [name]  07/27/2023, 9:30 AM

## 2023-07-27 NOTE — Plan of Care (Signed)

## 2023-07-27 NOTE — Progress Notes (Addendum)
Nutrition Follow-up  DOCUMENTATION CODES:   Severe malnutrition in context of acute illness/injury  INTERVENTION:  Increase Ensure Enlive po TID, each supplement provides 350 kcal and 20 grams of protein. Continue Juven BID, each packet provides 95 calories, 2.5 grams of protein (collagen), and 9.8 grams of carbohydrate (3 grams sugar); also contains 7 grams of L-arginine and L-glutamine, 300 mg vitamin C, 15 mg vitamin E, 1.2 mcg vitamin B-12, 9.5 mg zinc, 200 mg calcium, and 1.5 g  Calcium Beta-hydroxy-Beta-methylbutyrate to support wound healing Multivitamin w/ minerals daily Continue Magic cup TID with meals, each supplement provides 290 kcal and 9 grams of protein Discontinue Banatrol TF - TID  Discontinue Calorie Count  NUTRITION DIAGNOSIS:   Severe Malnutrition related to acute illness as evidenced by severe fat depletion, severe muscle depletion, energy intake < 75% for > 7 days. - Remains applicable  GOAL:   Patient will meet greater than or equal to 90% of their needs - Ongoing   MONITOR:   PO intake, Supplement acceptance  REASON FOR ASSESSMENT:   Malnutrition Screening Tool    ASSESSMENT:   67 y.o. male admits related to infected decubitus ulcer. PMH includes: HTN, DM, HLD, paraplegia, severe deconditioning. Pt is currently receiving medical management related to infected decubitus ulcer and aspiration pneumonia.  7/08 - Admitted  7/15 - L chest tube placed 7/23 - Calorie count concluded 7/26 - MBS; NPO; Cortrak placed; TF initiated 7/28 - chest tube removed  7/31 - MBS; diet advanced to full liquids  8/03 - Calorie Count started 8/05 - diet advanced to regular; Cortrak removed  Pt laying in bed, reports that he is not doing good today because his legs. Said he just ate not too long ago. Glad the Cortrak tube is removed. Encouraged pt to aim for 3 Ensure's per day, pt reports that he is already doing that. Reports that he was having a lot of bowel movements  while on TF; explained that should improve with tube removed.   RD collected calorie count information. Limited information available. Cortrak feeding tube was removed, will discontinue calorie count at this time.   Meal Intake 7/18-7/23: 0-100% x 8 meals (average 44%)  Medications reviewed and include: Vitamin C, NovoLog SSI, Semglee, MVI, Protonix, Senokot-S, Thiamine, IV antibiotics, IV Diflucan Labs reviewed: Sodium 134, Potassium 4.0 CBG: 110-178 x 24 hrs  UOP: 1250 mL x 24 hrs  Diet Order:   Diet Order             Diet regular Room service appropriate? No; Fluid consistency: Thin  Diet effective now                   EDUCATION NEEDS:   Not appropriate for education at this time  Skin:  Skin Assessment: Skin Integrity Issues: Skin Integrity Issues:: Stage IV Stage II: R buttocks; L Hip; R lateral lumbar Stage IV: L lateral heel Unstageable: R upper buttocks; R lateral foot; R lateral heel; L heel  Last BM:  8/5  Height:   Ht Readings from Last 1 Encounters:  06/30/23 5\' 10"  (1.778 m)    Weight:   Wt Readings from Last 1 Encounters:  07/27/23 91 kg   BMI:  Body mass index is 28.79 kg/m.  Estimated Nutritional Needs:  Kcal:  2200-2400 Protein:  120-140 Fluid:  >/= 2 L/day   Kirby Crigler RD, LDN Clinical Dietitian See [facility] for contact information.

## 2023-07-28 ENCOUNTER — Emergency Department (HOSPITAL_COMMUNITY)
Admission: EM | Admit: 2023-07-28 | Discharge: 2023-07-29 | Disposition: A | Discharge status: Home / Self Care | Payer: 59 | Attending: Emergency Medicine | Admitting: Emergency Medicine

## 2023-07-28 ENCOUNTER — Encounter (HOSPITAL_COMMUNITY): Payer: Self-pay

## 2023-07-28 ENCOUNTER — Other Ambulatory Visit: Payer: Self-pay

## 2023-07-28 DIAGNOSIS — G8929 Other chronic pain: Secondary | ICD-10-CM | POA: Insufficient documentation

## 2023-07-28 LAB — GLUCOSE, CAPILLARY
Glucose-Capillary: 107 mg/dL — ABNORMAL HIGH (ref 70–99)
Glucose-Capillary: 137 mg/dL — ABNORMAL HIGH (ref 70–99)
Glucose-Capillary: 146 mg/dL — ABNORMAL HIGH (ref 70–99)
Glucose-Capillary: 75 mg/dL (ref 70–99)

## 2023-07-28 LAB — CK: Total CK: 323 U/L (ref 49–397)

## 2023-07-28 MED ORDER — HEPARIN SOD (PORK) LOCK FLUSH 100 UNIT/ML IV SOLN
250.0000 [IU] | INTRAVENOUS | Status: AC | PRN
  Administered 2023-07-28: 250 [IU]

## 2023-07-28 MED ORDER — OXYCODONE HCL 5 MG PO TABS: 5.0000 mg | ORAL_TABLET | Freq: Two times a day (BID) | ORAL | 0 refills | Status: DC

## 2023-07-28 MED ORDER — SODIUM CHLORIDE 0.9 % IV SOLN: 8.0000 mg/kg | Freq: Every day | INTRAVENOUS | Status: AC

## 2023-07-28 MED ORDER — HYDROMORPHONE HCL 2 MG PO TABS: 2.0000 mg | ORAL_TABLET | Freq: Four times a day (QID) | ORAL | 0 refills | Status: DC

## 2023-07-28 MED ORDER — ACETAMINOPHEN 160 MG/5ML PO SOLN: 650.0000 mg | Freq: Four times a day (QID) | ORAL | Status: DC

## 2023-07-28 MED ORDER — HYDROMORPHONE HCL 2 MG PO TABS: 1.0000 mg | ORAL_TABLET | ORAL | 0 refills | Status: DC | PRN

## 2023-07-28 MED ORDER — HYDROMORPHONE HCL 2 MG PO TABS: 2.0000 mg | ORAL_TABLET | Freq: Four times a day (QID) | ORAL | Status: DC

## 2023-07-28 MED ORDER — HYDROMORPHONE HCL 2 MG PO TABS
2.0000 mg | ORAL_TABLET | Freq: Once | ORAL | Status: AC
  Administered 2023-07-28: 2 mg via ORAL
  Filled 2023-07-28: qty 1

## 2023-07-28 MED ORDER — HYDROXYZINE HCL 25 MG PO TABS: 25.0000 mg | ORAL_TABLET | Freq: Three times a day (TID) | ORAL | Status: DC | PRN

## 2023-07-28 MED ORDER — LORAZEPAM 0.5 MG PO TABS: 0.5000 mg | ORAL_TABLET | Freq: Three times a day (TID) | ORAL | 1 refills | Status: DC | PRN

## 2023-07-28 MED ORDER — INSULIN GLARGINE-YFGN 100 UNIT/ML ~~LOC~~ SOLN: 5.0000 [IU] | Freq: Every day | SUBCUTANEOUS | Status: DC

## 2023-07-28 MED ORDER — INSULIN ASPART 100 UNIT/ML IJ SOLN: 0.0000 [IU] | Freq: Every day | INTRAMUSCULAR | Status: DC

## 2023-07-28 MED ORDER — LIDOCAINE 5 % EX PTCH: 1.0000 | MEDICATED_PATCH | CUTANEOUS | Status: DC

## 2023-07-28 MED ORDER — COLLAGENASE 250 UNIT/GM EX OINT: TOPICAL_OINTMENT | Freq: Every day | CUTANEOUS | Status: DC

## 2023-07-28 MED ORDER — VITAMIN B-1 100 MG PO TABS: 100.0000 mg | ORAL_TABLET | Freq: Every day | ORAL | Status: DC

## 2023-07-28 MED ORDER — GABAPENTIN 300 MG PO CAPS
800.0000 mg | ORAL_CAPSULE | Freq: Once | ORAL | Status: AC
  Administered 2023-07-28: 800 mg via ORAL
  Filled 2023-07-28: qty 2

## 2023-07-28 MED ORDER — SODIUM CHLORIDE 0.9 % IV SOLN: 2.0000 g | INTRAVENOUS | Status: AC

## 2023-07-28 MED ORDER — IPRATROPIUM BROMIDE 0.02 % IN SOLN: 0.5000 mg | RESPIRATORY_TRACT | 0 refills | Status: DC | PRN

## 2023-07-28 MED ORDER — ORAL CARE MOUTH RINSE: 15.0000 mL | Freq: Every day | OROMUCOSAL | Status: DC

## 2023-07-28 MED ORDER — HYDROMORPHONE HCL 2 MG PO TABS: 1.0000 mg | ORAL_TABLET | ORAL | Status: DC | PRN

## 2023-07-28 MED ORDER — INSULIN GLARGINE-YFGN 100 UNIT/ML ~~LOC~~ SOLN
5.0000 [IU] | Freq: Every day | SUBCUTANEOUS | Status: DC
  Administered 2023-07-28: 5 [IU] via SUBCUTANEOUS
  Filled 2023-07-28: qty 0.05

## 2023-07-28 MED ORDER — INSULIN ASPART 100 UNIT/ML IJ SOLN: 0.0000 [IU] | Freq: Three times a day (TID) | INTRAMUSCULAR | Status: DC

## 2023-07-28 MED ORDER — BUSPIRONE HCL 5 MG PO TABS: 5.0000 mg | ORAL_TABLET | Freq: Three times a day (TID) | ORAL | Status: DC

## 2023-07-28 MED ORDER — FLUCONAZOLE IN SODIUM CHLORIDE 200-0.9 MG/100ML-% IV SOLN: 200.0000 mg | INTRAVENOUS | Status: AC

## 2023-07-28 MED ORDER — HYDROMORPHONE HCL 2 MG PO TABS
1.0000 mg | ORAL_TABLET | ORAL | Status: DC | PRN
  Administered 2023-07-28 (×2): 1 mg via ORAL
  Filled 2023-07-28 (×2): qty 1

## 2023-07-28 MED ORDER — METHOCARBAMOL 500 MG PO TABS: 500.0000 mg | ORAL_TABLET | Freq: Three times a day (TID) | ORAL | Status: AC

## 2023-07-28 NOTE — Plan of Care (Signed)

## 2023-07-28 NOTE — ED Provider Notes (Signed)
[place] [facility]ERGENCY DEPARTMENT AT [facility] Provider Note   CSN: [id number] Arrival date & time: 07/28/23  2219     History  Chief Complaint  Patient presents with   Pain    Pt brought in by EMS from [facility] wanting dilaudid for pain control    [name] is a 67 y.o. male with past medical history noted for recent month-long hospital admission for aspiration pneumonia who presents with concern for pain.  Patient was just discharged this morning.  He is still having some IV antibiotics and currently staying in skilled nursing facility for rehab after being discharged.  Patient was supposed to have a prescription for Dilaudid.  On chart review it seems the prescription was printed and patient was not able to pick it up.  HPI     Home Medications Prior to Admission medications   Medication Sig Start Date End Date Taking? Authorizing Provider  acetaminophen (TYLENOL) 160 MG/5ML solution Take 20.3 mLs (650 mg total) by mouth every 6 (six) hours. 07/28/23   Monna Fam, MD  busPIRone (BUSPAR) 5 MG tablet Take 1 tablet (5 mg total) by mouth 3 (three) times daily. 07/28/23   Monna Fam, MD  cefTRIAXone 2 g in sodium chloride 0.9 % 100 mL Inject 2 g into the vein daily for 2 days. 07/29/23 07/31/23  Monna Fam, MD  collagenase Melburn Popper) 250 UNIT/GM ointment Apply topically daily. 07/29/23   Monna Fam, MD  DAPTOmycin 700 mg in sodium chloride 0.9 % 50 mL Inject 700 mg into the vein daily for 2 days. 07/29/23 07/31/23  Monna Fam, MD  feeding supplement (ENSURE ENLIVE / ENSURE PLUS) LIQD Take 237 mLs by mouth 3 (three) times daily between meals. 06/29/23   Annett Fabian, MD  fluconazole (DIFLUCAN) 200-0.9 MG/100ML-% IVPB Inject 100 mLs (200 mg total) into the vein daily for 3 days. 07/28/23 07/31/23  Monna Fam, MD  gabapentin (NEURONTIN) 800 MG tablet Take 800 mg by mouth 3 (three) times daily.    [provider]  HYDROmorphone (DILAUDID) 2 MG tablet Take 1 tablet (2  mg total) by mouth every 6 (six) hours. 07/28/23   Meital Riehl H, PA-C  hydrOXYzine (ATARAX) 25 MG tablet Take 1 tablet (25 mg total) by mouth 3 (three) times daily as needed for anxiety. 07/28/23   Monna Fam, MD  insulin aspart (NOVOLOG) 100 UNIT/ML injection Inject 0-15 Units into the skin 3 (three) times daily with meals. 07/28/23   Monna Fam, MD  insulin aspart (NOVOLOG) 100 UNIT/ML injection Inject 0-5 Units into the skin at bedtime. 07/28/23   Monna Fam, MD  insulin glargine-yfgn [facility]) 100 UNIT/ML injection Inject 0.05 mLs (5 Units total) into the skin daily. 07/29/23   Monna Fam, MD  ipratropium (ATROVENT) 0.02 % nebulizer solution Take 2.5 mLs (0.5 mg total) by nebulization every 4 (four) hours as needed for wheezing or shortness of breath. 07/28/23   Monna Fam, MD  lidocaine (LIDODERM) 5 % Place 1 patch onto the skin daily. Remove & Discard patch within 12 hours or as directed by MD 07/28/23   Monna Fam, MD  LORazepam (ATIVAN) 0.5 MG tablet Take 1 tablet (0.5 mg total) by mouth every 8 (eight) hours as needed (nerve provlem). 07/28/23   Monna Fam, MD  losartan (COZAAR) 25 MG tablet Take 25 mg by mouth daily.    [provider]  methocarbamol (ROBAXIN) 500 MG tablet Take 1 tablet (500 mg total) by mouth 3 (three) times daily  for 7 days. 07/28/23 08/04/23  Monna Fam, MD  mirtazapine (REMERON SOL-TAB) 15 MG disintegrating tablet Take 1 tablet (15 mg total) by mouth at bedtime. 04/01/23 03/31/24  Morene Crocker, MD  Mouthwashes (MOUTH RINSE) LIQD solution 15 mLs by Mouth Rinse route daily. 07/28/23   Monna Fam, MD  Multiple Vitamin (MULTIVITAMIN WITH MINERALS) TABS tablet Take 1 tablet by mouth daily. 06/29/23   Annett Fabian, MD  nutrition supplement, JUVEN, (JUVEN) PACK Take 1 packet by mouth 2 (two) times daily between meals. 06/29/23 07/29/23  Annett Fabian, MD  oxyCODONE (OXY IR/ROXICODONE) 5 MG immediate release tablet Take 1 tablet (5 mg total) by mouth 2  (two) times daily. 07/28/23   Monna Fam, MD  pantoprazole (PROTONIX) 40 MG tablet Take 1 tablet (40 mg total) by mouth 2 (two) times daily before a meal. Patient not taking: Reported on 05/09/2023 05/04/23 06/03/23  Willette Cluster, MD  senna-docusate (SENOKOT-S) 8.6-50 MG tablet Take 1 tablet by mouth at bedtime. 05/08/23   Willette Cluster, MD  thiamine (VITAMIN B-1) 100 MG tablet Take 1 tablet (100 mg total) by mouth daily. 07/29/23   Monna Fam, MD      Allergies    Lipitor [atorvastatin calcium]    Review of Systems   Review of Systems  All other systems reviewed and are negative.   Physical Exam Updated Vital Signs BP (!) 147/99 (BP Location: Right Arm)   Pulse (!) 109   Temp 98.1 F (36.7 C) (Oral)   Resp 18   Ht 5\' 10"  (1.778 m)   Wt 86 kg   SpO2 98%   BMI 27.20 kg/m  Physical Exam Vitals and nursing note reviewed.  Constitutional:      General: He is not in acute distress.    Appearance: Normal appearance. He is ill-appearing.     Comments: Chronically ill-appearing, nontoxic, nonseptic appearing  HENT:     Head: Normocephalic and atraumatic.  Eyes:     General:        Right eye: No discharge.        Left eye: No discharge.  Cardiovascular:     Rate and Rhythm: Normal rate and regular rhythm.     Heart sounds: No murmur heard.    No friction rub. No gallop.  Pulmonary:     Effort: Pulmonary effort is normal.     Breath sounds: Normal breath sounds.     Comments: On oxygen which is current baseline, no acute respiratory distress Abdominal:     General: Bowel sounds are normal.     Palpations: Abdomen is soft.  Skin:    General: Skin is warm and dry.     Capillary Refill: Capillary refill takes less than 2 seconds.  Neurological:     Mental Status: He is alert and oriented to person, place, and time.  Psychiatric:        Mood and Affect: Mood normal.        Behavior: Behavior normal.     ED Results / Procedures / Treatments   Labs (all labs ordered are  listed, but only abnormal results are displayed) Labs Reviewed - No data to display  EKG None  Radiology No results found.  Procedures Procedures    Medications Ordered in ED Medications  HYDROmorphone (DILAUDID) tablet 2 mg (2 mg Oral Given 07/28/23 2257)  gabapentin (NEURONTIN) capsule 800 mg (800 mg Oral Given 07/28/23 2306)    ED Course/ Medical Decision Making/ A&P  Medical Decision Making  This patient was seen and admitted for aspiration pneumonia, discharged with pain control this morning.  He reports he was unable to pick up his pain medication.  I do not think that he needs a reevaluation for his known ongoing aspiration pneumonia, discharge instructions for his IV antibiotics are accurate.  However I will fill his narcotic medication to his preferred pharmacy and administer 1 dose in the emergency department tonight as well as with gabapentin which is his home pain medication.  He does not have any additional complaints, does not feel short of breath at this time.  His vital signs are stable other than a mild tachycardia on arrival of 109 which was improved to 88 on my reevaluation. Final Clinical Impression(s) / ED Diagnoses Final diagnoses:  Other chronic pain    Rx / DC Orders ED Discharge Orders          Ordered    HYDROmorphone (DILAUDID) 2 MG tablet  Every 6 hours        07/28/23 2303              Olene Floss, PA-C 07/28/23 2352    Charlynne Pander, MD 07/31/23 1451

## 2023-07-28 NOTE — Progress Notes (Signed)
Received a call from [facility], report given to [facility], all questions addressed

## 2023-07-28 NOTE — ED Notes (Signed)
Patient discharge to SNF they were unable to get his dilaudid  they offered him 2 tabs percocet he refused called 911 And demanded to be brought back to ER for dilaudid for chronic pain control

## 2023-07-28 NOTE — TOC Progression Note (Addendum)
Transition of Care [facility]) - Progression Note    Patient Details  Name: [name] MRN: [id number] Date of Birth: 1956-02-28  Transition of Care [facility]) CM/SW Contact  Lorri Frederick, LCSW Phone Number: 07/28/2023, 9:38 AM  Clinical Narrative:   SNF auth remains pending at this time.   1145: Auth approved in [place]: [id number], [id number], 3 days: 8/6-8/8.    CSW confirmed with Janie/[facility] that she can receive pt today.    MD informed.   1345: CSW spoke with brother by phone, somewhat difficult to understand.  Informed him of DC to [facility] today.  He asked about pt wheelchair and then said he is at the hospital and will take the wheelchair home himself.     Expected Discharge Plan: Skilled Nursing Facility Barriers to Discharge: Continued Medical Work up  Expected Discharge Plan and Services                                               Social Determinants of Health (SDOH) Interventions SDOH Screenings   Food Insecurity: No Food Insecurity (06/30/2023)  Recent Concern: Food Insecurity - Food Insecurity Present (06/24/2023)  Housing: Low Risk  (06/30/2023)  Transportation Needs: No Transportation Needs (06/30/2023)  Utilities: Not At Risk (06/30/2023)  Tobacco Use: High Risk (06/29/2023)    Readmission Risk Interventions    06/22/2023    4:22 PM 05/04/2023    1:50 PM  Readmission Risk Prevention Plan  Transportation Screening Complete Complete  PCP or Specialist Appt within 5-7 Days  Complete  PCP or Specialist Appt within 3-5 Days Complete   Home Care Screening  Complete  Medication Review (RN CM)  Referral to Pharmacy  HRI or Home Care Consult Complete   Social Work Consult for Recovery Care Planning/Counseling Complete   Palliative Care Screening Complete   Medication Review [employer]) Complete

## 2023-07-28 NOTE — Progress Notes (Signed)
Attempted to call report, notified that I will receive a call back. Ptar here to pick up pt

## 2023-07-28 NOTE — Discharge Summary (Addendum)
Name: [name] MRN: [id number] DOB: 12/07/1956 67 y.o. PCP: Pcp, No  Date of Admission: 06/29/2023  9:16 PM Date of Discharge: 07/28/23 Attending Physician: Dr. Cleda Daub  Discharge Diagnosis: Principal Problem:   Infected decubitus ulcer Active Problems:   Type 2 diabetes mellitus with hyperglycemia (HCC)   Protein-calorie malnutrition, severe   Osteomyelitis of right femur (HCC)   Goals of care, counseling/discussion   Osteomyelitis (HCC)   Aspiration pneumonia of left lower lobe (HCC)   Parapneumonic effusion   Need for management of chest tube   Pleural effusion   Pneumonia of right lung due to infectious organism   Empyema (HCC)   Oropharyngeal candidiasis    Discharge Medications: Allergies as of 07/28/2023       Reactions   Lipitor [atorvastatin Calcium] Other (See Comments)   Myalgias         Medication List     STOP taking these medications    cefTRIAXone IVPB Commonly known as: ROCEPHIN   daptomycin IVPB Commonly known as: CUBICIN       TAKE these medications    acetaminophen 160 MG/5ML solution Commonly known as: TYLENOL Take 20.3 mLs (650 mg total) by mouth every 6 (six) hours.   busPIRone 5 MG tablet Commonly known as: BUSPAR Take 1 tablet (5 mg total) by mouth 3 (three) times daily.   cefTRIAXone 2 g in sodium chloride 0.9 % 100 mL Inject 2 g into the vein daily for 2 days. Start taking on: July 29, 2023   collagenase 250 UNIT/GM ointment Commonly known as: SANTYL Apply topically daily. Start taking on: July 29, 2023   DAPTOmycin 700 mg in sodium chloride 0.9 % 50 mL Inject 700 mg into the vein daily for 2 days. Start taking on: July 29, 2023   fluconazole 200-0.9 MG/100ML-% IVPB Commonly known as: DIFLUCAN Inject 100 mLs (200 mg total) into the vein daily for 3 days.   gabapentin 800 MG tablet Commonly known as: NEURONTIN Take 800 mg by mouth 3 (three) times daily.   HYDROmorphone 2 MG tablet Commonly known as:  DILAUDID Take 1 tablet (2 mg total) by mouth every 6 (six) hours.   HYDROmorphone 2 MG tablet Commonly known as: DILAUDID Take 0.5 tablets (1 mg total) by mouth every 3 (three) hours as needed for severe pain.   hydrOXYzine 25 MG tablet Commonly known as: ATARAX Take 1 tablet (25 mg total) by mouth 3 (three) times daily as needed for anxiety.   insulin aspart 100 UNIT/ML injection Commonly known as: novoLOG Inject 0-15 Units into the skin 3 (three) times daily with meals.   insulin aspart 100 UNIT/ML injection Commonly known as: novoLOG Inject 0-5 Units into the skin at bedtime.   insulin glargine-yfgn 100 UNIT/ML injection Commonly known as: SEMGLEE Inject 0.05 mLs (5 Units total) into the skin daily. Start taking on: July 29, 2023   ipratropium 0.02 % nebulizer solution Commonly known as: ATROVENT Take 2.5 mLs (0.5 mg total) by nebulization every 4 (four) hours as needed for wheezing or shortness of breath.   lidocaine 5 % Commonly known as: LIDODERM Place 1 patch onto the skin daily. Remove & Discard patch within 12 hours or as directed by MD   LORazepam 0.5 MG tablet Commonly known as: Ativan Take 1 tablet (0.5 mg total) by mouth every 8 (eight) hours as needed (nerve provlem).   losartan 25 MG tablet Commonly known as: COZAAR Take 25 mg by mouth daily.   methocarbamol 500  MG tablet Commonly known as: ROBAXIN Take 1 tablet (500 mg total) by mouth 3 (three) times daily for 7 days.   mirtazapine 15 MG disintegrating tablet Commonly known as: REMERON SOL-TAB Take 1 tablet (15 mg total) by mouth at bedtime.   mouth rinse Liqd solution 15 mLs by Mouth Rinse route daily.   multivitamin with minerals Tabs tablet Take 1 tablet by mouth daily.   nutrition supplement (JUVEN) Pack Take 1 packet by mouth 2 (two) times daily between meals.   feeding supplement Liqd Take 237 mLs by mouth 3 (three) times daily between meals.   oxyCODONE 5 MG immediate release  tablet Commonly known as: Oxy IR/ROXICODONE Take 1 tablet (5 mg total) by mouth 2 (two) times daily.   pantoprazole 40 MG tablet Commonly known as: PROTONIX Take 1 tablet (40 mg total) by mouth 2 (two) times daily before a meal.   senna-docusate 8.6-50 MG tablet Commonly known as: Senokot-S Take 1 tablet by mouth at bedtime.   thiamine 100 MG tablet Commonly known as: Vitamin B-1 Take 1 tablet (100 mg total) by mouth daily. 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   07/28/23 1328            Disposition and follow-up:   WalterWalter Kane was discharged from [facility] in Stable condition.  At the hospital follow up visit please address:  1.  Follow-up:  a. Aspiration pneumonia: ensure no shortness of breath/chest pain, no choking or regurgitation of food  b. Osteomyelitis/chronic decubitus ulcer of right hip: check chronic wound to see if it is continuing to heal, no worsening of pain or signs of further infection  c. Oral/esophageal thrush, dysphagia: ensure no trouble swallowing, no current thrush  d. Poor nutrition, chronic deconditioning: ensure patient is able to eat enough PO and does not need tube feeds or other supplementation  E. Hyperglycemia: check BG  F. Hypertension: check BP, has been taking antihypertensives  G. Anxiety: mood assessment  H. Transaminitis: small elevation in AST/ALT, suspect due to abx  2.  Labs / imaging needed at time of follow-up: none  3.  Pending labs/ test needing follow-up: none  4.  Medication  Changes  Started: Dilaudid 2mg  q6, Dilaudid 1mg  q4 PRN, Hydroxyzine 25mg  TID PRN  Abx -  Ceftriaxone 2g IV, Daptomycin 700mg  IV, fluconazole 200mg  IV  End Date for all: 07/31/23  Follow-up Appointments:  Contact information for after-discharge care     Destination     [facility] Preferred SNF .   Service: Skilled Nursing Contact information: [address] [place] [address] [phone]                     Hospital Course by problem list:    Aspiration pneumonia He experienced new respiratory distress on 7/10 with tachycardia and tachypnea.  CTA was obtained for concerns of pulmonary embolism,  but instead demonstrated a multilobar pneumonia predominantly in the left lower lobe. This was new from a prior CT a few days earlier. Given his generalized weakness and bedbound status, we felt it was most likely aspiration pneumonia. He was already on daptomycin and ceftriaxone for his osteomyelitis; we added Flagyl for anaerobic coverage.  He experienced some respiratory distress and required up to 6 L nasal cannula.  Since the chest CT showed evidence of a loculated parapneumonic effusion, a chest tube was placed on 7/15 for drainage by pulmonology. It drained ~2L in total over a week. On 7/19 he spiked a fever after refusing a few doses of tylenol. We held the tylenol, concerned it was masking an underlying fever, and transitioned him to vancomyin/cefepime. His fever resolved, but his pneumonia continued to worsen on the right, so ID was reconsulted. They switched him to Linezolid and unasyn for better pulmonary and MRSA coverage.  He was then switched back to Daptomycin and Ceftriaxone, and repeat chest x-ray showed interval improvement on respiratory status.   Osteomyelitis of the right greater trochanter secondary to an infected decubitus ulcer of the right lateral hip He was initially admitted on 6/27 for infected ulcer of the right  hip with purulent discharge.  CRP was 16.6, ESR was 70.  MRI demonstrated a large decubitus ulcer posterolateral to the right greater trochanter with cortical destruction consistent with osteomyelitis.  Infectious diseases was consulted and the patient began a 6-week course of daptomycin and ceftriaxone, which ends on 8/9. A PICC line was placed in his left arm for IV antibiotic administration. The ulcer was debrided by general surgery.  He received daily hydrotherapy and dressing changes during his hospitalization.  Blood cultures remained negative and he has remained afebrile. His leukocytosis quickly resolved with antibiotics.  His wound healing is complicated by malnutrition and immobility, as he has been bedbound for several months and has developed severe pain and contractures in his lower extremities, as well as generalized weakness and muscle atrophy.  He has been getting weekly CK labs to monitor for myositis in the setting of daptomycin use.  We have also been monitoring weekly CBC, CMP, and ESR/CRP. He is now on Ceftriaxone and daptomycin to finish on 8/9 per infectious disease.   Muscle spasms Sarcopenia, bilateral LE weakness and contractures Poor nutritional status Patient has been bedbound since April secondary to severe contractures and pain in his lower extremities.  His immobility began following a femoral fracture, which was repaired by IM in on 4/5.  He has developed significant sarcopenia due to this immobility and is unable to care for himself on his own.  He also has muscle spasms which are being treated with Robaxin 750 mg 3 times daily.  He has not been able to do much physical therapy during his hospital stay due to his pain, contractures, and weakness; PT recommended home PT if the patient is able, as it will greatly improve his condition.  He was previously discharged home by his request, and home health was set up to help care for him.,  He says he was in too much pain and his  brother was unable to assist him like he needed to be assisted, so he returned to the hospital for readmission on 7/8.  He has been on nutrition supplementation throughout his hospitalization to help with wound healing; he has also received frequent turning and offloading.  His pain regimen includes Tylenol, gabapentin 800 mg 3 times daily, and Dilaudid PO 2mg  Q6H  PRN, dilaudid 1mg  Q3H PRN.  Elevated ALT, AST Recent uptake since switch back to Daptomycin and Ceftriaxone. Currently asymptomatic, RUQ ultrasound obtained did not show any concern. Trending downwards, would recommend follow up in the outpatient setting. CK within normal limits as well.    Oral/Esophageal thrush Newly worsening dysphagia/odynophagia Silent aspiration He had a modified barium swallow, which was concerning for aspiration with possible acute on chronic dysphagia in the setting of infection and deconditioning.  SLP recommended making him NPO given his ongoing pneumonia and reengaging palliative care for discussion of goals of care. He had a CorTrak placed and is receiving all nutrition by tube. He is NPO and all medications are IV/IM/per tube. On 07/27/23, his cortrak was removed and he was tolerating PO intake well. SLP evaluated him and believe he is improving as well.  He is on IV Fluconazole for his oral thrush, set to finish on 8/9.   Chronic Normocytic anemia History of gastric and duodenal ulcers His baseline hemoglobin appears to be around 9-10.  It has remained stable throughout admission.  No recent GI bleeding has been noted.  Gastric ulcers and duodenal ulcers were noted on a prior EGD in April; he may need outpatient follow-up with GI.  He is taking Protonix 40 mg twice daily.  Hyperglycemia Pt carries diagnosis of Type 2 Diabetes Mellitus, however last A1c three months ago was 4.7. His glucose was controlled with a sliding scale of insulin, as well as semglee 10U while he was on tube feeds. Of note, there were times  where his glucose level would fluctuate to 70-80 range, therefore recommend continued sliding scale with semglee of 10U.   Hypertension  Blood pressures have been slightly elevated during this admission.  He is taking losartan 25 mg daily at home, continued on admission.   Anxiety Pt has a history of anxiety which worsened during his hospitalization.  He is especially anxious about the possibility of dying.  We started him on Buspar 5 mg TID, and hydroxyzine PRN.   Discharge Subjective: Saw patient at bedside this morning. We updated [name] on the plan for a possible discharge today. He was complaining of some leg discomfort and we told him of slight changes in his medications to increase his PRN dilaudid. He denies any new abdominal pain, shortness of breath, chest pain.    Discharge Exam:   BP (!) 134/56 (BP Location: Right Arm)   Pulse 92   Temp 98.3 F (36.8 C) (Oral)   Resp 16   Ht 5\' 10"  (1.778 m)   Wt 85.3 kg   SpO2 98%   BMI 26.98 kg/m  Constitutional: chronically ill-appearing,  in no acute distress HENT: normocephalic atraumatic, mucous membranes moist Eyes: conjunctiva non-erythematous Neck: supple Cardiovascular: regular rate and rhythm, no m/r/g Pulmonary/Chest: normal work of breathing on room air, lungs clear to auscultation bilaterally Abdominal: soft, non-tender, non-distended MSK: normal bulk and tone Neurological: alert & oriented x 3, 5/5 strength in bilateral upper and lower extremities, normal gait Skin: warm and dry   Pertinent Labs, Studies, and Procedures:     Latest Ref Rng & Units 07/28/2023    3:09 AM 07/27/2023    4:37 PM 07/27/2023    5:30 AM  CBC  WBC 4.0 - 10.5 K/uL 11.1   8.1   Hemoglobin 13.0 - 17.0 g/dL 8.1  8.3  6.7   Hematocrit 39.0 - 52.0 % 25.7  26.7  22.1   Platelets 150 - 400 K/uL 419  457        Latest Ref Rng & Units 07/28/2023    3:09 AM 07/27/2023    5:30 AM 07/25/2023    4:29 AM  CMP  Glucose 70 - 99 mg/dL 82  016  010    BUN 8 - 23 mg/dL 19  19  14    Creatinine 0.61 - 1.24 mg/dL 9.32  3.55  7.32   Sodium 135 - 145 mmol/L 135  134  133   Potassium 3.5 - 5.1 mmol/L 4.5  4.0  4.2   Chloride 98 - 111 mmol/L 98  99  98   CO2 22 - 32 mmol/L 28  28  30    Calcium 8.9 - 10.3 mg/dL 8.1  7.9  8.1   Total Protein 6.5 - 8.1 g/dL 5.5  5.4  5.6   Total Bilirubin 0.3 - 1.2 mg/dL 0.8  0.2  0.4   Alkaline Phos 38 - 126 U/L 71  66  85   AST 15 - 41 U/L 130  69  78   ALT 0 - 44 U/L 128  83  111     No results found.   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   Increase activity slowly   Complete by: As directed        Signed: Monna Fam, MD PGY-1 07/28/2023, 2:00 PM   Pager: [phone]

## 2023-07-28 NOTE — TOC Transition Note (Signed)
Transition of Care [facility]) - CM/SW Discharge Note   Patient Details  Name: [name] MRN: [id number] Date of Birth: 1956-11-11  [facility] CM/SW Contact:  Lorri Frederick, LCSW Phone Number: 07/28/2023, 1:56 PM   Clinical Narrative:   Pt discharging to [facility], room 3214.  RN call report to [phone].    Final next level of care: Skilled Nursing Facility Barriers to Discharge: Barriers Resolved   Patient Goals and CMS Choice      Discharge Placement                Patient chooses bed at: [facility] Patient to be transferred to facility by: [facility] Name of family member notified: brother [name] Patient and family notified of of transfer: 07/28/23  Discharge Plan and Services Additional resources added to the After Visit Summary for                                       Social Determinants of Health (SDOH) Interventions SDOH Screenings   Food Insecurity: No Food Insecurity (06/30/2023)  Recent Concern: Food Insecurity - Food Insecurity Present (06/24/2023)  Housing: Low Risk  (06/30/2023)  Transportation Needs: No Transportation Needs (06/30/2023)  Utilities: Not At Risk (06/30/2023)  Tobacco Use: High Risk (06/29/2023)     Readmission Risk Interventions    06/22/2023    4:22 PM 05/04/2023    1:50 PM  Readmission Risk Prevention Plan  Transportation Screening Complete Complete  PCP or Specialist Appt within 5-7 Days  Complete  PCP or Specialist Appt within 3-5 Days Complete   Home Care Screening  Complete  Medication Review (RN CM)  Referral to Pharmacy  HRI or Home Care Consult Complete   Social Work Consult for Recovery Care Planning/Counseling Complete   Palliative Care Screening Complete   Medication Review [facility]) Complete

## 2023-07-28 NOTE — Discharge Instructions (Addendum)
You were hospitalized for osteomyelitis and aspiration pneumonia. You were treated with IV antibiotics and tube feeds to treat your infections and to ensure you were receiving appropriate nutrition. Your infections have improved, and you are now able to sustain yourself on a regular diet. At this time, I feel that you are medically stable to be discharged to a skilled nursing facility. Thank you for allowing [place] to be part of your care.   We arranged for you to follow up at: [facility] on Aug 21 at 3:15   Please note these changes made to your medications:   *Please continue taking:  Tylenol solution Buspar 5mg  tablet three times daily Collagenase ointment Dilaudid 2mg  every three hours as needed Diluadid 1mg  every 6 hours Hydroxyzine 25mg  three times per day as needed Sliding scale insulin Semglee 5 units daily Atrovent nebulizer every 4 hours as needed Lidoderm patches Thiamine 100mg  tablet Gabapentin 800mg  three times daily Ativan .5mg  tablet every 8 hours as needed Losartan 25mg   Robaxin 500mg  three times daily as needed Mirtazapine 15mg  Juven nutritional supplement Ensure feeding supplement Oxycodone 5mg  two times daily Protonix 40mg  two times daily Senokot tablet   Please make sure to return to the hospital if you have worsening shortness of breath or fever  Please call our clinic if you have any questions or concerns, we may be able to help and keep you from a long and expensive emergency room wait. Our clinic and after hours phone number is [phone], the best time to call is Monday through Friday 9 am to 4 pm but there is always someone available 24/7 if you have an emergency. If you need medication refills please notify your pharmacy one week in advance and they will send [place] a request.

## 2023-07-28 NOTE — ED Triage Notes (Signed)
Patient arrived via ems from [facility] where he was discharged today. Patient reports they were not able to get his Dilaudid so he is back for Dilaudid.

## 2023-07-28 NOTE — ED Notes (Signed)
Writer attempted to call report to [facility]. Writer forwarded to give report, no answer. Writer left VM asking for call back.

## 2023-07-28 NOTE — Discharge Instructions (Addendum)
Please refer to patient's discharge instructions from earlier this morning, he is supposed to be getting ceftriaxone 2 g daily until 8/9, daptomycin 700 mg daily until 8/9 --his pain medications were sent to his pharmacy, [facility] on [address]. he has 2 mg Dilaudid that he can take up to every 6 hours for severe pain, he can take 1/2 of this dose for moderate pain.

## 2023-07-29 ENCOUNTER — Inpatient Hospital Stay: Payer: 59 | Admitting: Internal Medicine

## 2023-08-11 ENCOUNTER — Inpatient Hospital Stay (HOSPITAL_COMMUNITY): Payer: 59

## 2023-08-11 ENCOUNTER — Emergency Department (HOSPITAL_COMMUNITY): Payer: 59

## 2023-08-11 ENCOUNTER — Observation Stay (HOSPITAL_COMMUNITY)
Admission: EM | Admit: 2023-08-11 | Discharge: 2023-08-12 | Disposition: A | Discharge status: Hospice | Payer: 59 | Attending: Infectious Diseases | Admitting: Infectious Diseases

## 2023-08-11 ENCOUNTER — Other Ambulatory Visit: Payer: Self-pay

## 2023-08-11 DIAGNOSIS — Z7189 Other specified counseling: Secondary | ICD-10-CM | POA: Diagnosis not present

## 2023-08-11 DIAGNOSIS — R1310 Dysphagia, unspecified: Secondary | ICD-10-CM

## 2023-08-11 DIAGNOSIS — Z66 Do not resuscitate: Secondary | ICD-10-CM

## 2023-08-11 DIAGNOSIS — R0602 Shortness of breath: Secondary | ICD-10-CM | POA: Diagnosis present

## 2023-08-11 DIAGNOSIS — U071 COVID-19: Secondary | ICD-10-CM | POA: Diagnosis not present

## 2023-08-11 DIAGNOSIS — Z515 Encounter for palliative care: Secondary | ICD-10-CM | POA: Diagnosis not present

## 2023-08-11 DIAGNOSIS — A419 Sepsis, unspecified organism: Secondary | ICD-10-CM | POA: Insufficient documentation

## 2023-08-11 DIAGNOSIS — I10 Essential (primary) hypertension: Secondary | ICD-10-CM | POA: Insufficient documentation

## 2023-08-11 DIAGNOSIS — J189 Pneumonia, unspecified organism: Secondary | ICD-10-CM | POA: Diagnosis not present

## 2023-08-11 DIAGNOSIS — E43 Unspecified severe protein-calorie malnutrition: Secondary | ICD-10-CM | POA: Diagnosis not present

## 2023-08-11 DIAGNOSIS — J9312 Secondary spontaneous pneumothorax: Secondary | ICD-10-CM | POA: Diagnosis not present

## 2023-08-11 DIAGNOSIS — J449 Chronic obstructive pulmonary disease, unspecified: Secondary | ICD-10-CM | POA: Diagnosis not present

## 2023-08-11 DIAGNOSIS — M869 Osteomyelitis, unspecified: Secondary | ICD-10-CM | POA: Insufficient documentation

## 2023-08-11 DIAGNOSIS — J690 Pneumonitis due to inhalation of food and vomit: Secondary | ICD-10-CM | POA: Diagnosis present

## 2023-08-11 DIAGNOSIS — R451 Restlessness and agitation: Secondary | ICD-10-CM

## 2023-08-11 DIAGNOSIS — Z794 Long term (current) use of insulin: Secondary | ICD-10-CM | POA: Diagnosis not present

## 2023-08-11 DIAGNOSIS — J9601 Acute respiratory failure with hypoxia: Secondary | ICD-10-CM | POA: Diagnosis not present

## 2023-08-11 DIAGNOSIS — L8990 Pressure ulcer of unspecified site, unspecified stage: Secondary | ICD-10-CM | POA: Insufficient documentation

## 2023-08-11 DIAGNOSIS — E1159 Type 2 diabetes mellitus with other circulatory complications: Secondary | ICD-10-CM

## 2023-08-11 DIAGNOSIS — J9311 Primary spontaneous pneumothorax: Principal | ICD-10-CM

## 2023-08-11 DIAGNOSIS — E119 Type 2 diabetes mellitus without complications: Secondary | ICD-10-CM

## 2023-08-11 DIAGNOSIS — D500 Iron deficiency anemia secondary to blood loss (chronic): Secondary | ICD-10-CM | POA: Insufficient documentation

## 2023-08-11 LAB — CBG MONITORING, ED
Glucose-Capillary: 78 mg/dL (ref 70–99)
Glucose-Capillary: 63 mg/dL — ABNORMAL LOW (ref 70–99)
Glucose-Capillary: 50 mg/dL — ABNORMAL LOW (ref 70–99)
Glucose-Capillary: 71 mg/dL (ref 70–99)
Glucose-Capillary: 111 mg/dL — ABNORMAL HIGH (ref 70–99)
Glucose-Capillary: 122 mg/dL — ABNORMAL HIGH (ref 70–99)
Glucose-Capillary: 93 mg/dL (ref 70–99)

## 2023-08-11 LAB — URINALYSIS, W/ REFLEX TO CULTURE (INFECTION SUSPECTED)
Glucose, UA: NEGATIVE mg/dL
Hgb urine dipstick: NEGATIVE
Ketones, ur: 5 mg/dL — AB
Leukocytes,Ua: NEGATIVE
Nitrite: NEGATIVE
Protein, ur: 30 mg/dL — AB
Specific Gravity, Urine: 1.026 (ref 1.005–1.030)
pH: 5 (ref 5.0–8.0)

## 2023-08-11 LAB — BASIC METABOLIC PANEL
Anion gap: 9 (ref 5–15)
BUN: 33 mg/dL — ABNORMAL HIGH (ref 8–23)
CO2: 24 mmol/L (ref 22–32)
Calcium: 7.6 mg/dL — ABNORMAL LOW (ref 8.9–10.3)
Chloride: 105 mmol/L (ref 98–111)
Creatinine, Ser: 0.84 mg/dL (ref 0.61–1.24)
GFR, Estimated: >60 mL/min (ref >60)
Glucose, Bld: 84 mg/dL (ref 70–99)
Potassium: 4.5 mmol/L (ref 3.5–5.1)
Sodium: 138 mmol/L (ref 135–145)

## 2023-08-11 LAB — CBC WITH DIFFERENTIAL/PLATELET
Abs Immature Granulocytes: 0.12 10*3/uL — ABNORMAL HIGH (ref 0.00–0.07)
Basophils Absolute: 0.1 10*3/uL (ref 0.0–0.1)
Basophils Relative: 1 %
Eosinophils Absolute: 0 10*3/uL (ref 0.0–0.5)
Eosinophils Relative: 0 %
HCT: 28 % — ABNORMAL LOW (ref 39.0–52.0)
Hemoglobin: 8.4 g/dL — ABNORMAL LOW (ref 13.0–17.0)
Immature Granulocytes: 1 %
Lymphocytes Relative: 4 %
Lymphs Abs: 0.6 10*3/uL — ABNORMAL LOW (ref 0.7–4.0)
MCH: 26.8 pg (ref 26.0–34.0)
MCHC: 30 g/dL (ref 30.0–36.0)
MCV: 89.5 fL (ref 80.0–100.0)
Monocytes Absolute: 0.7 10*3/uL (ref 0.1–1.0)
Monocytes Relative: 4 %
Neutro Abs: 15.9 10*3/uL — ABNORMAL HIGH (ref 1.7–7.7)
Neutrophils Relative %: 90 %
Platelets: 461 10*3/uL — ABNORMAL HIGH (ref 150–400)
RBC: 3.13 MIL/uL — ABNORMAL LOW (ref 4.22–5.81)
RDW: 17.8 % — ABNORMAL HIGH (ref 11.5–15.5)
WBC: 17.4 10*3/uL — ABNORMAL HIGH (ref 4.0–10.5)
nRBC: 0 % (ref 0.0–0.2)

## 2023-08-11 LAB — COMPREHENSIVE METABOLIC PANEL
ALT: 29 U/L (ref 0–44)
AST: 35 U/L (ref 15–41)
Albumin: 1.8 g/dL — ABNORMAL LOW (ref 3.5–5.0)
Alkaline Phosphatase: 64 U/L (ref 38–126)
Anion gap: 13 (ref 5–15)
BUN: 38 mg/dL — ABNORMAL HIGH (ref 8–23)
CO2: 24 mmol/L (ref 22–32)
Calcium: 8.4 mg/dL — ABNORMAL LOW (ref 8.9–10.3)
Chloride: 102 mmol/L (ref 98–111)
Creatinine, Ser: 1.07 mg/dL (ref 0.61–1.24)
GFR, Estimated: >60 mL/min (ref >60)
Glucose, Bld: 97 mg/dL (ref 70–99)
Potassium: 4.8 mmol/L (ref 3.5–5.1)
Sodium: 139 mmol/L (ref 135–145)
Total Bilirubin: 0.6 mg/dL (ref 0.3–1.2)
Total Protein: 6.2 g/dL — ABNORMAL LOW (ref 6.5–8.1)

## 2023-08-11 LAB — I-STAT VENOUS BLOOD GAS, ED
Acid-Base Excess: 2 mmol/L (ref 0.0–2.0)
Bicarbonate: 27 mmol/L (ref 20.0–28.0)
Calcium, Ion: 1.13 mmol/L — ABNORMAL LOW (ref 1.15–1.40)
HCT: 27 % — ABNORMAL LOW (ref 39.0–52.0)
Hemoglobin: 9.2 g/dL — ABNORMAL LOW (ref 13.0–17.0)
O2 Saturation: 42 %
Potassium: 4.9 mmol/L (ref 3.5–5.1)
Sodium: 139 mmol/L (ref 135–145)
TCO2: 28 mmol/L (ref 22–32)
pCO2, Ven: 44.7 mmHg (ref 44–60)
pH, Ven: 7.389 (ref 7.25–7.43)
pO2, Ven: 24 mmHg — CL (ref 32–45)

## 2023-08-11 LAB — I-STAT CHEM 8, ED
BUN: 39 mg/dL — ABNORMAL HIGH (ref 8–23)
Calcium, Ion: 1.11 mmol/L — ABNORMAL LOW (ref 1.15–1.40)
Chloride: 104 mmol/L (ref 98–111)
Creatinine, Ser: 0.9 mg/dL (ref 0.61–1.24)
Glucose, Bld: 94 mg/dL (ref 70–99)
HCT: 29 % — ABNORMAL LOW (ref 39.0–52.0)
Hemoglobin: 9.9 g/dL — ABNORMAL LOW (ref 13.0–17.0)
Potassium: 5 mmol/L (ref 3.5–5.1)
Sodium: 139 mmol/L (ref 135–145)
TCO2: 26 mmol/L (ref 22–32)

## 2023-08-11 LAB — BRAIN NATRIURETIC PEPTIDE: B Natriuretic Peptide: 40.4 pg/mL (ref 0.0–100.0)

## 2023-08-11 LAB — CBC
HCT: 25.1 % — ABNORMAL LOW (ref 39.0–52.0)
Hemoglobin: 7.5 g/dL — ABNORMAL LOW (ref 13.0–17.0)
MCH: 26.6 pg (ref 26.0–34.0)
MCHC: 29.9 g/dL — ABNORMAL LOW (ref 30.0–36.0)
MCV: 89 fL (ref 80.0–100.0)
Platelets: 410 10*3/uL — ABNORMAL HIGH (ref 150–400)
RBC: 2.82 MIL/uL — ABNORMAL LOW (ref 4.22–5.81)
RDW: 17.7 % — ABNORMAL HIGH (ref 11.5–15.5)
WBC: 17 10*3/uL — ABNORMAL HIGH (ref 4.0–10.5)
nRBC: 0 % (ref 0.0–0.2)

## 2023-08-11 LAB — GLUCOSE, CAPILLARY: Glucose-Capillary: 96 mg/dL (ref 70–99)

## 2023-08-11 LAB — APTT: aPTT: 39 seconds — ABNORMAL HIGH (ref 24–36)

## 2023-08-11 LAB — I-STAT CG4 LACTIC ACID, ED
Lactic Acid, Venous: 1.2 mmol/L (ref 0.5–1.9)
Lactic Acid, Venous: 1.6 mmol/L (ref 0.5–1.9)

## 2023-08-11 LAB — RESP PANEL BY RT-PCR (RSV, FLU A&B, COVID)  RVPGX2
Influenza A by PCR: NEGATIVE
Influenza B by PCR: NEGATIVE
Resp Syncytial Virus by PCR: NEGATIVE
SARS Coronavirus 2 by RT PCR: POSITIVE — AB

## 2023-08-11 LAB — TROPONIN I (HIGH SENSITIVITY)
Troponin I (High Sensitivity): 13 ng/L (ref <18)
Troponin I (High Sensitivity): 12 ng/L (ref <18)

## 2023-08-11 LAB — PROTIME-INR
INR: 1.2 (ref 0.8–1.2)
Prothrombin Time: 15.1 seconds (ref 11.4–15.2)

## 2023-08-11 LAB — MRSA NEXT GEN BY PCR, NASAL: MRSA by PCR Next Gen: NOT DETECTED

## 2023-08-11 MED ORDER — INSULIN ASPART 100 UNIT/ML IJ SOLN: 0.0000 [IU] | Freq: Three times a day (TID) | INTRAMUSCULAR | Status: DC

## 2023-08-11 MED ORDER — LACTATED RINGERS IV BOLUS (SEPSIS)
1000.0000 mL | Freq: Once | INTRAVENOUS | Status: AC
  Administered 2023-08-11: 1000 mL via INTRAVENOUS

## 2023-08-11 MED ORDER — HYDROMORPHONE HCL 1 MG/ML IJ SOLN
1.0000 mg | INTRAMUSCULAR | Status: DC | PRN
  Administered 2023-08-11: 1 mg via INTRAVENOUS
  Filled 2023-08-11: qty 1

## 2023-08-11 MED ORDER — GABAPENTIN 400 MG PO CAPS: 800.0000 mg | ORAL_CAPSULE | Freq: Three times a day (TID) | ORAL | Status: DC

## 2023-08-11 MED ORDER — INSULIN GLARGINE-YFGN 100 UNIT/ML ~~LOC~~ SOLN
5.0000 [IU] | Freq: Every day | SUBCUTANEOUS | Status: DC
  Filled 2023-08-11 (×2): qty 0.05

## 2023-08-11 MED ORDER — LORAZEPAM 1 MG PO TABS: 0.5000 mg | ORAL_TABLET | Freq: Three times a day (TID) | ORAL | Status: DC | PRN

## 2023-08-11 MED ORDER — BUSPIRONE HCL 5 MG PO TABS
5.0000 mg | ORAL_TABLET | Freq: Three times a day (TID) | ORAL | Status: DC
  Administered 2023-08-11 (×3): 5 mg via ORAL
  Filled 2023-08-11 (×4): qty 1

## 2023-08-11 MED ORDER — THIAMINE MONONITRATE 100 MG PO TABS: 100.0000 mg | ORAL_TABLET | Freq: Every day | ORAL | Status: DC

## 2023-08-11 MED ORDER — ACETAMINOPHEN 650 MG RE SUPP: 650.0000 mg | Freq: Four times a day (QID) | RECTAL | Status: DC | PRN

## 2023-08-11 MED ORDER — ORAL CARE MOUTH RINSE
15.0000 mL | Freq: Every day | OROMUCOSAL | Status: DC
  Administered 2023-08-11 – 2023-08-12 (×2): 15 mL via OROMUCOSAL

## 2023-08-11 MED ORDER — GUAIFENESIN 100 MG/5ML PO LIQD
5.0000 mL | ORAL | Status: DC | PRN
  Administered 2023-08-11: 5 mL via ORAL
  Filled 2023-08-11: qty 10

## 2023-08-11 MED ORDER — HYDROXYZINE HCL 25 MG PO TABS: 25.0000 mg | ORAL_TABLET | Freq: Three times a day (TID) | ORAL | Status: DC | PRN

## 2023-08-11 MED ORDER — INSULIN ASPART 100 UNIT/ML IJ SOLN: 0.0000 [IU] | Freq: Every day | INTRAMUSCULAR | Status: DC

## 2023-08-11 MED ORDER — SODIUM CHLORIDE 0.9 % IV SOLN
200.0000 mg | Freq: Once | INTRAVENOUS | Status: AC
  Administered 2023-08-11: 200 mg via INTRAVENOUS
  Filled 2023-08-11: qty 40

## 2023-08-11 MED ORDER — LACTATED RINGERS IV BOLUS
1000.0000 mL | Freq: Once | INTRAVENOUS | Status: AC
  Administered 2023-08-11: 1000 mL via INTRAVENOUS

## 2023-08-11 MED ORDER — MIRTAZAPINE 15 MG PO TBDP: 15.0000 mg | ORAL_TABLET | Freq: Every day | ORAL | Status: DC

## 2023-08-11 MED ORDER — ENOXAPARIN SODIUM 40 MG/0.4ML IJ SOSY
40.0000 mg | PREFILLED_SYRINGE | INTRAMUSCULAR | Status: DC
  Administered 2023-08-11: 40 mg via SUBCUTANEOUS
  Filled 2023-08-11 (×2): qty 0.4

## 2023-08-11 MED ORDER — PIPERACILLIN-TAZOBACTAM 3.375 G IVPB 30 MIN
3.3750 g | Freq: Three times a day (TID) | INTRAVENOUS | Status: DC
  Administered 2023-08-11 – 2023-08-12 (×4): 3.375 g via INTRAVENOUS
  Filled 2023-08-11 (×5): qty 50

## 2023-08-11 MED ORDER — OXYCODONE HCL 5 MG PO TABS: 5.0000 mg | ORAL_TABLET | Freq: Two times a day (BID) | ORAL | Status: DC

## 2023-08-11 MED ORDER — SODIUM CHLORIDE 0.9 % IV SOLN: 2.0000 g | Freq: Three times a day (TID) | INTRAVENOUS | Status: DC

## 2023-08-11 MED ORDER — IPRATROPIUM-ALBUTEROL 0.5-2.5 (3) MG/3ML IN SOLN
3.0000 mL | Freq: Four times a day (QID) | RESPIRATORY_TRACT | Status: DC
  Administered 2023-08-11 (×3): 3 mL via RESPIRATORY_TRACT
  Filled 2023-08-11 (×4): qty 3

## 2023-08-11 MED ORDER — VANCOMYCIN HCL IN DEXTROSE 1-5 GM/200ML-% IV SOLN: 1000.0000 mg | Freq: Once | INTRAVENOUS | Status: DC

## 2023-08-11 MED ORDER — HYDROMORPHONE HCL 1 MG/ML IJ SOLN
0.5000 mg | Freq: Four times a day (QID) | INTRAMUSCULAR | Status: DC | PRN
  Administered 2023-08-11 (×2): 0.5 mg via INTRAVENOUS
  Filled 2023-08-11 (×2): qty 1

## 2023-08-11 MED ORDER — DEXTROSE 10 % IV SOLN: INTRAVENOUS | Status: DC

## 2023-08-11 MED ORDER — ACETAMINOPHEN 160 MG/5ML PO SOLN: 650.0000 mg | Freq: Four times a day (QID) | ORAL | Status: DC

## 2023-08-11 MED ORDER — LACTATED RINGERS IV SOLN: INTRAVENOUS | Status: DC

## 2023-08-11 MED ORDER — SODIUM CHLORIDE 0.9 % IV SOLN
2.0000 g | Freq: Once | INTRAVENOUS | Status: AC
  Administered 2023-08-11: 2 g via INTRAVENOUS
  Filled 2023-08-11: qty 12.5

## 2023-08-11 MED ORDER — VANCOMYCIN HCL 2000 MG/400ML IV SOLN
2000.0000 mg | Freq: Once | INTRAVENOUS | Status: AC
  Administered 2023-08-11: 2000 mg via INTRAVENOUS
  Filled 2023-08-11: qty 400

## 2023-08-11 MED ORDER — LOSARTAN POTASSIUM 50 MG PO TABS: 25.0000 mg | ORAL_TABLET | Freq: Every day | ORAL | Status: DC

## 2023-08-11 MED ORDER — ACETAMINOPHEN 325 MG PO TABS: 650.0000 mg | ORAL_TABLET | Freq: Four times a day (QID) | ORAL | Status: DC | PRN

## 2023-08-11 MED ORDER — SENNOSIDES-DOCUSATE SODIUM 8.6-50 MG PO TABS: 1.0000 | ORAL_TABLET | Freq: Every day | ORAL | Status: DC

## 2023-08-11 MED ORDER — ADULT MULTIVITAMIN W/MINERALS CH: 1.0000 | ORAL_TABLET | Freq: Every day | ORAL | Status: DC

## 2023-08-11 MED ORDER — SODIUM CHLORIDE 0.9 % IV SOLN
100.0000 mg | Freq: Every day | INTRAVENOUS | Status: DC
  Administered 2023-08-12: 100 mg via INTRAVENOUS
  Filled 2023-08-11: qty 20

## 2023-08-11 MED ORDER — IPRATROPIUM BROMIDE 0.02 % IN SOLN: 0.5000 mg | RESPIRATORY_TRACT | Status: DC | PRN

## 2023-08-11 MED ORDER — SENNOSIDES-DOCUSATE SODIUM 8.6-50 MG PO TABS: 1.0000 | ORAL_TABLET | Freq: Every evening | ORAL | Status: DC | PRN

## 2023-08-11 MED ORDER — VANCOMYCIN HCL IN DEXTROSE 1-5 GM/200ML-% IV SOLN: 1000.0000 mg | Freq: Two times a day (BID) | INTRAVENOUS | Status: DC

## 2023-08-11 MED ORDER — COLLAGENASE 250 UNIT/GM EX OINT
TOPICAL_OINTMENT | Freq: Every day | CUTANEOUS | Status: DC
  Filled 2023-08-11: qty 30

## 2023-08-11 NOTE — Procedures (Signed)
Thoracentesis  Procedure Note  DEMBA HULLENDER  [id number]  12-05-1956  Date:08/11/23  Time:2:57 AM   Provider Performing:Rossy Virag R Quin Mathenia   Procedure: Thoracentesis with imaging guidance (40981)  Indication(s) Moderate pneumothorax  Consent Risks of the procedure as well as the alternatives and risks of each were explained to the patient and/or caregiver.  Verbal consent was obtained and the presence of RN.  Anesthesia Topical only with 1% lidocaine    Time Out Verified patient identification, verified procedure, site/side was marked, verified correct patient position, special equipment/implants available, medications/allergies/relevant history reviewed, required imaging and test results available.   Sterile Technique Maximal sterile technique including full sterile barrier drape, hand hygiene, sterile gown, sterile gloves, mask, hair covering, sterile ultrasound probe cover (if used).  Procedure Description Ultrasound was used to identify appropriate pleural anatomy for placement and overlying skin marked.  Lack of pleural slide and barcode sign and M-mode.  Area of drainage cleaned and draped in sterile fashion. Lidocaine was used to anesthetize the skin and subcutaneous tissue.  Several hundred cc of estimated air was evacuated from the pleural space.  Small amount of pleural fluid was obtained and no further evacuation could be achieved.  The catheter was removed with as the patient exhaled.  Band-Aids were placed over the incision.  Complications/Tolerance None; patient tolerated the procedure well. Chest X-ray is ordered to confirm no post-procedural complication.   EBL Minimal   Specimen(s) None

## 2023-08-11 NOTE — Sepsis Progress Note (Signed)
Elink following for sepsis protocol. 

## 2023-08-11 NOTE — Progress Notes (Signed)
Subjective:   Summary: [name] is a 67 y.o. year old male currently admitted on the [facility] HD#0 for Covid, aspiration pneumonia.  Overnight Events: none  Saw patient at bedside this am. Patient endorses continued shortness of breath, pain in his back and legs. We discussed his current situation and continued episodes of aspiration and the possibility for a PEG tube placement. Patient stated he would need to think about it and discuss with his brother.   Objective:  Vital signs in last 24 hours: Vitals:   08/11/23 0909 08/11/23 1045 08/11/23 1100 08/11/23 1115  BP: (!) 111/58 111/70 119/89 (!) 111/59  Pulse: (!) 114 (!) 110 (!) 111 (!) 103  Resp: (!) 24 (!) 21 (!) 29 (!) 21  Temp:      TempSrc:      SpO2: 96% 100% 100% 100%  Weight:      Height:       Supplemental O2: Nasal Cannula SpO2: 100 % O2 Flow Rate (L/min): 4 L/min   Physical Exam:  Constitutional: well-appearing, in no acute distress Cardiovascular: RRR, no murmurs, rubs or gallops Pulmonary/Chest: normal work of breathing on room air, lungs clear to auscultation bilaterally Abdominal: soft, non-tender, non-distended Skin: warm and dry Extremities: upper/lower extremity pulses 2+, no lower extremity edema present  Filed Weights   08/11/23 0104  Weight: 86 kg     Intake/Output Summary (Last 24 hours) at 08/11/2023 1125 Last data filed at 08/11/2023 0959 Gross per 24 hour  Intake 3265.13 ml  Output --  Net 3265.13 ml   Net IO Since Admission: 3,265.13 mL [08/11/23 1125]  Pertinent Labs:    Latest Ref Rng & Units 08/11/2023    6:31 AM 08/11/2023    3:14 AM 08/11/2023    3:13 AM  CBC  WBC 4.0 - 10.5 K/uL 17.0     Hemoglobin 13.0 - 17.0 g/dL 7.5  9.9  9.2   Hematocrit 39.0 - 52.0 % 25.1  29.0  27.0   Platelets 150 - 400 K/uL 410          Latest Ref Rng & Units 08/11/2023    6:31 AM 08/11/2023    3:14 AM 08/11/2023    3:13 AM  CMP  Glucose 70 - 99 mg/dL 84  94    BUN 8 - 23  mg/dL 33  39    Creatinine 8.46 - 1.24 mg/dL 9.62  9.52    Sodium 841 - 145 mmol/L 138  139  139   Potassium 3.5 - 5.1 mmol/L 4.5  5.0  4.9   Chloride 98 - 111 mmol/L 105  104    CO2 22 - 32 mmol/L 24     Calcium 8.9 - 10.3 mg/dL 7.6       Imaging: DG CHEST PORT 1 VIEW  Result Date: 08/11/2023 CLINICAL DATA:  Pneumothorax follow-up EXAM: PORTABLE CHEST 1 VIEW COMPARISON:  Prior chest x-ray obtained earlier today at 2:52 a.m. FINDINGS: Slight interval progression of right apical pneumothorax with 2.8 cm displacement compared to 1.6 cm earlier this morning. Increasing pulmonary vascular congestion and interstitial prominence suggest developing pulmonary edema. Cardiomegaly. Increasing bibasilar airspace opacities likely reflect atelectasis. IMPRESSION: 1. Slight interval increase in right apical pneumothorax compared to earlier this morning. 2. Developing pulmonary edema and bibasilar atelectasis. Electronically Signed   By: Malachy Moan M.D.   On: 08/11/2023 09:28   DG Chest Portable 1  View  Result Date: 08/11/2023 CLINICAL DATA:  Follow-up pneumothorax. Shortness of breath. Dr. Attempted to aspirate pneumothorax. EXAM: PORTABLE CHEST 1 VIEW COMPARISON:  Radiograph 08/11/2023 at 1:11 a.m. FINDINGS: Slightly decreased size of the small to moderate right apical and lateral pneumothorax. Stable enlargement of the cardiomediastinal silhouette. Persistent infiltrates in the right upper and lower lung. No pleural effusion. Chronic interstitial coarsening. No displaced rib fractures. IMPRESSION: Slight decrease in size of the right small-moderate pneumothorax. Electronically Signed   By: Minerva Fester M.D.   On: 08/11/2023 03:19   DG Chest Port 1 View  Addendum Date: 08/11/2023   ADDENDUM REPORT: 08/11/2023 01:45 ADDENDUM: These results were called by telephone at the time of interpretation on 08/11/2023 at 1:45 am to provider Glynn Octave , who verbally acknowledged these results. l  Electronically Signed   By: Helyn Numbers M.D.   On: 08/11/2023 01:45   Result Date: 08/11/2023 CLINICAL DATA:  Dyspnea, sepsis EXAM: PORTABLE CHEST 1 VIEW COMPARISON:  07/24/2023 FINDINGS: The lungs are hyperinflated in keeping with changes of underlying COPD. Moderate right pneumothorax has developed. No radiographic evidence of tension. Extensive interstitial infiltrate within the right upper lobe appears improved in the interval since prior examination. Patchy infiltrate within the right mid lung zone right lung base persist. No pleural effusion. No pneumothorax on the left. Cardiac size is within normal limits. IMPRESSION: 1. Moderate right pneumothorax. No radiographic evidence of tension physiology. 2. COPD. 3. Improving right upper lobe infiltrate. Persistent patchy pneumonic infiltrate within the right mid and lower lung zone. Electronically Signed: By: Helyn Numbers M.D. On: 08/11/2023 01:37     Assessment/Plan:   Principal Problem:   Acute hypoxic respiratory failure (HCC) Active Problems:   Severe protein-calorie malnutrition (HCC)   Secondary spontaneous pneumothorax   COVID   Diabetes mellitus (HCC)   Patient Summary: [name] is a 67 y.o. with a pertinent PMH of T2DM, HTN, Afib, paraplegia, severe deconditioning/malnutrition who presented with shortness of breath and admitted for Covid, aspiration pneumonia, pneumothorax.   #Sepsis 2/2 aspiration pneumonia #Covid #Pneumothorax #Acute on chronic hypoxic respiratory failure Patient presenting with shortness of breath, Covid positive. Patient has been using 2L supplemental oxygen after last hospital discharge for aspiration pneumonia, now requiring 4L. Leukocytosis to 17.4 with neutrophil predominance. CXR showed a moderate right apical pneumothorax and patchy infiltrate within the right mid and lower lung zone. Suspect pneumothorax is 2/2 coughing from Covid and pneumonia. PCCM consulted and performed needle decompression  with moderate improvement of the pneumothorax. Cefepime started for aspiration pneumonia, transitioned to Zosyn for better anaerobic coverage.  -serial CXR to ensure resolution of pneumothorax -Zosyn for aspiration pneumonia -Remdesivir for Covid -IV fluids per sepsis protocol -NPO currently, SLP recommending MBS today  #Decubitus ulcer Pt is bedbound and has poor nutritional intake. Ulcer does not appear infected.  -wound consult ordered -sacral foam, q2hr turns -IV dilaudid 0.5mg  q6hrs  #Failure to thrive Patient has had multiple ED visits and hospitalizations this year with recurrent episodes of aspiration of pneumonia. He did transition to comfort care at one point but rescinded the orders. May need PEG tube placement and chronic NPO status -Palliative team consulted  #T2DM -On SSI -Receiving dextrose for hypoglycemia while NPO  #HTN -currently holding home antihypertensives. Can use IV prn meds  #Anxiety -chronic. Home buspar, hydroxyzine held now.   #Normocytic anemia -chronic. CBC at baseline around 8.4. Will recommend oral iron at discharge  #Osteomyelitis -s/p 6 weeks IV abx   Diet:  NPO pending SLP eval IVF: none VTE: Lovenox Code: DNR  Dispo: Anticipated discharge to Skilled nursing facility pending medical stability  Monna Fam, MD PGY-1 Internal Medicine Resident Pager Number [phone] Please contact the on call pager after 5 pm and on weekends at [phone].

## 2023-08-11 NOTE — Procedures (Addendum)
Modified Barium Swallow Study  Patient Details  Name: [name] MRN: [id number] Date of Birth: [birth year]  Today's Date: 08/11/2023  HPI/PMH: HPI: Pt is a 67 year old chronically ill male with Pmhx of HTN, DMII, paraplegia of the lower extremities, protein calorie malnutrition c/b by sarcopenia, Afib, aspiration pneumonia who presented with worsening shortness of breath. He was noted to be COVID positive at the SNF. CXR done showed pt with a pneumothorax and a right middle/lower lobe infiltrate. MBS 07/22/23, mild pharyngeal residual, two instances of flash, trace penetration, no aspiration; full liquids recommended. F/u SLP session recommended regular texture/thin liquids.  Patient discharged to SNF on 07/28/23, but readmitted to hospital 08/11/23 with worsening SOB, Covid+.   Clinical Impression: Clinical Impression: Patient's dysphagia appears worsened as compared to most recent MBS on 07/22/23, however it appears similar to MBS completed on 07/17/23. SLP suspects that patient's baseline dysphagia becomes exacerbated when his health deteriorates/declines and currently he has Covid-19 infection. During today's MBS, positioning of patient in chair in radiology suite was challenging and head was tilted back during PO intake. SLP does suspect this had impact on his function as patient with observed cervical osteophytes on previous MBS which were preventing epiglottic inversion. PO's of puree, thin and nectar thick liquids were administered. With all boluses, majority of barium entered into pyriform sinus but with very little PES opening. This led to moderate-maximal amount of PO residuals as well as sensed penetration and aspiration of all consistencies. Cued dry swallows helped to clear some of residuals but reduced overall only to moderate amount. Cued throat clear was effective to transit penetrate out of laryngeal vestibule, however repeated penetration and aspiration occured from residuals which  remained in pyriform sinus. SLP recommending continue NPO status at this time and will follow patient for PO trials and patient/family education.  Factors that may increase risk of adverse event in presence of aspiration [name] & [name]): Factors that may increase risk of adverse event in presence of aspiration [name] & [name]): Poor general health and/or compromised immunity; Dependence for feeding and/or oral hygiene; Frail or deconditioned; Limited mobility; Weak cough   Recommendations/Plan: Swallowing Evaluation Recommendations Swallowing Evaluation Recommendations Recommendations: NPO Medication Administration: Via alternative means Oral care recommendations: Staff/trained caregiver to provide oral care; Oral care QID (4x/day) Recommended consults: Consider Palliative care    Treatment Plan Treatment Plan Treatment recommendations: Therapy as outlined in treatment plan below Follow-up recommendations: Skilled nursing-short term rehab (<3 hours/day) Functional status assessment: Patient has had a recent decline in their functional status and/or demonstrates limited ability to make significant improvements in function in a reasonable and predictable amount of time. Treatment frequency: Min 2x/week Treatment duration: 2 weeks Interventions: Aspiration precaution training; Oropharyngeal exercises; Patient/family education; Trials of upgraded texture/liquids; Respiratory muscle strength training; Diet toleration management by SLP     Recommendations Recommendations for follow up therapy are one component of a multi-disciplinary discharge planning process, led by the attending physician.  Recommendations may be updated based on patient status, additional functional criteria and insurance authorization.  Assessment: Orofacial Exam: Orofacial Exam Oral Cavity: Oral Hygiene: WFL Oral Cavity - Dentition: Adequate natural dentition Orofacial Anatomy: WFL Oral  Motor/Sensory Function: Generalized oral weakness    Anatomy:  Anatomy: Presence of cervical hardware; Suspected cervical osteophytes   Boluses Administered: Boluses Administered Boluses Administered: Thin liquids (Level 0); Mildly thick liquids (Level 2, nectar thick); Moderately thick liquids (Level 3, honey thick); Puree; Solid     Oral Impairment Domain:  Oral Impairment Domain Lip Closure: No labial escape Tongue control during bolus hold: Cohesive bolus between tongue to palatal seal Bolus transport/lingual motion: Brisk tongue motion Oral residue: Trace residue lining oral structures Location of oral residue : Tongue Initiation of pharyngeal swallow : Pyriform sinuses     Pharyngeal Impairment Domain: Pharyngeal Impairment Domain Laryngeal elevation: Complete superior movement of thyroid cartilage with complete approximation of arytenoids to epiglottic petiole Anterior hyoid excursion: Complete anterior movement Epiglottic movement: Partial inversion Laryngeal vestibule closure: Incomplete, narrow column air/contrast in laryngeal vestibule Pharyngeal stripping wave : Present - diminished Pharyngeal contraction (A/P view only): N/A Pharyngoesophageal segment opening: Minimal distention/minimal duration, marked obstruction of flow Tongue base retraction: Narrow column of contrast or air between tongue base and PPW Pharyngeal residue: Collection of residue within or on pharyngeal structures Location of pharyngeal residue: Valleculae; Pyriform sinuses; Aryepiglottic folds     Esophageal Impairment Domain: Esophageal Impairment Domain Esophageal clearance upright position: Complete clearance, esophageal coating    Pill: No data recorded  Penetration/Aspiration Scale Score: Penetration/Aspiration Scale Score 7.  Material enters airway, passes BELOW cords and not ejected out despite cough attempt by patient: Puree; Mildly thick liquids (Level 2, nectar thick); Thin  liquids (Level 0)    Compensatory Strategies: Compensatory Strategies Compensatory strategies: No       General Information: Caregiver present: No   Diet Prior to this Study: NPO    Temperature : Normal    Respiratory Status: WFL    Supplemental O2: Nasal cannula    History of Recent Intubation: No   Behavior/Cognition: Alert; Cooperative; Pleasant mood  Self-Feeding Abilities: Dependent for feeding  Baseline vocal quality/speech: Normal  Volitional Cough: Able to elicit  Volitional Swallow: Able to elicit  Exam Limitations: Poor positioning   Goal Planning: Prognosis for improved oropharyngeal function: Fair  Barriers to Reach Goals: Time post onset; Severity of deficits  No data recorded No data recorded Consulted and agree with results and recommendations: Patient   Pain: Pain Assessment Pain Assessment: Faces Faces Pain Scale: 2 Pain Location: legs Pain Descriptors / Indicators: Discomfort Pain Intervention(s): Limited activity within patient's tolerance    End of Session: Start Time:SLP Start Time (ACUTE ONLY): 1520  Stop Time: SLP Stop Time (ACUTE ONLY): 1540  Time Calculation:SLP Time Calculation (min) (ACUTE ONLY): 20 min  Charges: SLP Evaluations $ SLP Speech Visit: 1 Visit  SLP Evaluations $BSS Swallow: 1 Procedure $MBS Swallow: 1 Procedure   SLP visit diagnosis: SLP Visit Diagnosis: Dysphagia, pharyngeal phase (R13.13)    Past Medical History:  Past Medical History:  Diagnosis Date   Cellulitis 07/17/2020   LEFT HIP   Diabetes mellitus    Dyspnea    Hyperlipidemia    Hypertension    Past Surgical History:  Past Surgical History:  Procedure Laterality Date   BIOPSY  05/01/2023   Procedure: BIOPSY;  Surgeon: Beverley Fiedler, MD;  Location: [facility] ENDOSCOPY;  Service: Gastroenterology;;   ESOPHAGOGASTRODUODENOSCOPY (EGD) WITH PROPOFOL N/A 05/01/2023   Procedure: ESOPHAGOGASTRODUODENOSCOPY (EGD) WITH PROPOFOL;  Surgeon:  Beverley Fiedler, MD;  Location: [facility] ENDOSCOPY;  Service: Gastroenterology;  Laterality: N/A;   FEMUR IM NAIL Left 03/27/2023   Procedure: INTRAMEDULLARY (IM) RETROGRADE FEMORAL NAILING;  Surgeon: Myrene Galas, MD;  Location: [facility] OR;  Service: Orthopedics;  Laterality: Left;   SPINE SURGERY      Angela Nevin, MA, CCC-SLP Speech Therapy

## 2023-08-11 NOTE — Progress Notes (Signed)
ED Pharmacy Antibiotic Sign Off An antibiotic consult was received from an ED provider for cefepime and vancomycin per pharmacy dosing for sepsis. A chart review was completed to assess appropriateness.   The following one time order(s) were placed:   Cefepime 2g IV x1 per MD Vancomycin 2g IV x1  Further antibiotic and/or antibiotic pharmacy consults should be ordered by the admitting provider if indicated.   Thank you for allowing pharmacy to be a part of this patient's care.   Arabella Merles, [facility]  Clinical Pharmacist 08/11/23 12:53 AM

## 2023-08-11 NOTE — Progress Notes (Signed)
Pharmacy Antibiotic Note  [name] is a 67 y.o. male admitted on 08/11/2023 with pneumonia.  Pharmacy has been consulted for vancomycin and cefepime dosing.  Noted that SCr has doubled from baseline ~0.5.  Plan: Vancomycin 2000mg  x1 then 1000mg  IV Q12H. Goal AUC 400-550.  Expected AUC 430. Cefepime 2g IV Q8H.  Height: 5\' 10"  (177.8 cm) Weight: 86 kg (189 lb 9.5 oz) IBW/kg (Calculated) : 73  Temp (24hrs), Avg:100.2 F (37.9 C), Min:100.2 F (37.9 C), Max:100.2 F (37.9 C)  Recent Labs  Lab 08/11/23 0258 08/11/23 0314 08/11/23 0506  WBC 17.4*  --   --   CREATININE 1.07 0.90  --   LATICACIDVEN  --  1.2 1.6    Estimated Creatinine Clearance: 84.5 mL/min (by C-G formula based on SCr of 0.9 mg/dL).    Allergies  Allergen Reactions   Lipitor [Atorvastatin Calcium] Other (See Comments)    Myalgias     Thank you for allowing pharmacy to be a part of this patient's care.  Vernard Gambles, PharmD, BCPS  08/11/2023 5:13 AM

## 2023-08-11 NOTE — ED Provider Notes (Signed)
[place] EMERGENCY DEPARTMENT AT [facility] Provider Note   CSN: [id number] Arrival date & time: 08/11/23  0040     History  No chief complaint on file.   [name] is a 67 y.o. male.  Patient with a history of diabetes, COPD, hypertension, pressure ulcer to right hip.  Recent admission for aspiration pneumonia as well as wound to right hip.  Sent from facility with respiratory distress, shortness of breath and concern for pneumonia and pneumothorax on chest x-ray.  He is also reportedly COVID-positive today.  Unclear while chest x-ray was done today.  Patient does not know why he is here.  States his breathing is at baseline.  He is on 4 L of oxygen but EMS states his sats were in the 70s and they placed on a nonrebreather.  He denies any chest pain, abdominal pain, nausea, vomiting, cough or fever.  No leg pain or leg swelling.  X-ray at his facility apparently showed a 15% apical pneumothorax on the right.  The history is provided by the patient and the EMS personnel. The history is limited by the condition of the patient.       Home Medications Prior to Admission medications   Medication Sig Start Date End Date Taking? Authorizing Provider  acetaminophen (TYLENOL) 160 MG/5ML solution Take 20.3 mLs (650 mg total) by mouth every 6 (six) hours. 07/28/23   Monna Fam, MD  busPIRone (BUSPAR) 5 MG tablet Take 1 tablet (5 mg total) by mouth 3 (three) times daily. 07/28/23   Monna Fam, MD  collagenase Melburn Popper) 250 UNIT/GM ointment Apply topically daily. 07/29/23   Monna Fam, MD  feeding supplement (ENSURE ENLIVE / ENSURE PLUS) LIQD Take 237 mLs by mouth 3 (three) times daily between meals. 06/29/23   Annett Fabian, MD  gabapentin (NEURONTIN) 800 MG tablet Take 800 mg by mouth 3 (three) times daily.    [provider]  HYDROmorphone (DILAUDID) 2 MG tablet Take 1 tablet (2 mg total) by mouth every 6 (six) hours. 07/28/23   Prosperi, Christian H, PA-C   hydrOXYzine (ATARAX) 25 MG tablet Take 1 tablet (25 mg total) by mouth 3 (three) times daily as needed for anxiety. 07/28/23   Monna Fam, MD  insulin aspart (NOVOLOG) 100 UNIT/ML injection Inject 0-15 Units into the skin 3 (three) times daily with meals. 07/28/23   Monna Fam, MD  insulin aspart (NOVOLOG) 100 UNIT/ML injection Inject 0-5 Units into the skin at bedtime. 07/28/23   Monna Fam, MD  insulin glargine-yfgn [facility]) 100 UNIT/ML injection Inject 0.05 mLs (5 Units total) into the skin daily. 07/29/23   Monna Fam, MD  ipratropium (ATROVENT) 0.02 % nebulizer solution Take 2.5 mLs (0.5 mg total) by nebulization every 4 (four) hours as needed for wheezing or shortness of breath. 07/28/23   Monna Fam, MD  lidocaine (LIDODERM) 5 % Place 1 patch onto the skin daily. Remove & Discard patch within 12 hours or as directed by MD 07/28/23   Monna Fam, MD  LORazepam (ATIVAN) 0.5 MG tablet Take 1 tablet (0.5 mg total) by mouth every 8 (eight) hours as needed (nerve provlem). 07/28/23   Monna Fam, MD  losartan (COZAAR) 25 MG tablet Take 25 mg by mouth daily.    [provider]  mirtazapine (REMERON SOL-TAB) 15 MG disintegrating tablet Take 1 tablet (15 mg total) by mouth at bedtime. 04/01/23 03/31/24  Morene Crocker, MD  Mouthwashes (MOUTH RINSE) LIQD solution 15 mLs by Mouth Rinse route daily.  07/28/23   Monna Fam, MD  Multiple Vitamin (MULTIVITAMIN WITH MINERALS) TABS tablet Take 1 tablet by mouth daily. 06/29/23   Annett Fabian, MD  oxyCODONE (OXY IR/ROXICODONE) 5 MG immediate release tablet Take 1 tablet (5 mg total) by mouth 2 (two) times daily. 07/28/23   Monna Fam, MD  pantoprazole (PROTONIX) 40 MG tablet Take 1 tablet (40 mg total) by mouth 2 (two) times daily before a meal. Patient not taking: Reported on 05/09/2023 05/04/23 06/03/23  Willette Cluster, MD  senna-docusate (SENOKOT-S) 8.6-50 MG tablet Take 1 tablet by mouth at bedtime. 05/08/23   Willette Cluster, MD  thiamine (VITAMIN  B-1) 100 MG tablet Take 1 tablet (100 mg total) by mouth daily. 07/29/23   Monna Fam, MD      Allergies    Lipitor [atorvastatin calcium]    Review of Systems   Review of Systems  Constitutional:  Negative for activity change, appetite change and fever.  HENT:  Negative for congestion and rhinorrhea.   Respiratory:  Positive for cough and shortness of breath.   Cardiovascular:  Negative for chest pain.  Gastrointestinal:  Negative for abdominal pain, nausea and vomiting.  Genitourinary:  Negative for dysuria and hematuria.  Musculoskeletal:  Negative for arthralgias and myalgias.  Skin:  Negative for rash.  Neurological:  Negative for dizziness, weakness and headaches.   all other systems are negative except as noted in the HPI and PMH.    Physical Exam Updated Vital Signs BP (!) 127/57   Pulse 71   Temp 98.1 F (36.7 C) (Oral)   Resp (!) 24   Ht 5\' 10"  (1.778 m)   Wt 86 kg   SpO2 99%   BMI 27.20 kg/m  Physical Exam Vitals and nursing note reviewed.  Constitutional:      General: He is not in acute distress.    Appearance: He is well-developed. He is ill-appearing.     Comments: Chronically ill-appearing, mild tachypnea  HENT:     Head: Normocephalic and atraumatic.     Mouth/Throat:     Pharynx: No oropharyngeal exudate.  Eyes:     Conjunctiva/sclera: Conjunctivae normal.     Pupils: Pupils are equal, round, and reactive to light.  Neck:     Comments: No meningismus. Cardiovascular:     Rate and Rhythm: Normal rate and regular rhythm.     Heart sounds: Normal heart sounds. No murmur heard. Pulmonary:     Effort: Pulmonary effort is normal. No respiratory distress.     Breath sounds: Rhonchi present.     Comments: Diminished breath sounds on the right, rhonchi and Abdominal:     Palpations: Abdomen is soft.     Tenderness: There is no abdominal tenderness. There is no guarding or rebound.  Musculoskeletal:        General: No tenderness. Normal range of  motion.     Cervical back: Normal range of motion and neck supple.     Comments: Large decubitus ulcer to right hip and ischial area. Tunneling with packing in place  Skin:    General: Skin is warm.  Neurological:     Mental Status: He is alert and oriented to person, place, and time.     Cranial Nerves: No cranial nerve deficit.     Motor: No abnormal muscle tone.     Coordination: Coordination normal.     Comments:  5/5 strength throughout. CN 2-12 intact.Equal grip strength.   Psychiatric:        Behavior:  Behavior normal.     ED Results / Procedures / Treatments   Labs (all labs ordered are listed, but only abnormal results are displayed) Labs Reviewed  RESP PANEL BY RT-PCR (RSV, FLU A&B, COVID)  RVPGX2 - Abnormal; Notable for the following components:      Result Value   SARS Coronavirus 2 by RT PCR POSITIVE (*)    All other components within normal limits  COMPREHENSIVE METABOLIC PANEL - Abnormal; Notable for the following components:   BUN 38 (*)    Calcium 8.4 (*)    Total Protein 6.2 (*)    Albumin 1.8 (*)    All other components within normal limits  CBC WITH DIFFERENTIAL/PLATELET - Abnormal; Notable for the following components:   WBC 17.4 (*)    RBC 3.13 (*)    Hemoglobin 8.4 (*)    HCT 28.0 (*)    RDW 17.8 (*)    Platelets 461 (*)    Neutro Abs 15.9 (*)    Lymphs Abs 0.6 (*)    Abs Immature Granulocytes 0.12 (*)    All other components within normal limits  APTT - Abnormal; Notable for the following components:   aPTT 39 (*)    All other components within normal limits  URINALYSIS, W/ REFLEX TO CULTURE (INFECTION SUSPECTED) - Abnormal; Notable for the following components:   Color, Urine AMBER (*)    APPearance CLOUDY (*)    Bilirubin Urine SMALL (*)    Ketones, ur 5 (*)    Protein, ur 30 (*)    Bacteria, UA RARE (*)    All other components within normal limits  CBC - Abnormal; Notable for the following components:   WBC 17.0 (*)    RBC 2.82 (*)     Hemoglobin 7.5 (*)    HCT 25.1 (*)    MCHC 29.9 (*)    RDW 17.7 (*)    Platelets 410 (*)    All other components within normal limits  BASIC METABOLIC PANEL - Abnormal; Notable for the following components:   BUN 33 (*)    Calcium 7.6 (*)    All other components within normal limits  I-STAT CHEM 8, ED - Abnormal; Notable for the following components:   BUN 39 (*)    Calcium, Ion 1.11 (*)    Hemoglobin 9.9 (*)    HCT 29.0 (*)    All other components within normal limits  I-STAT VENOUS BLOOD GAS, ED - Abnormal; Notable for the following components:   pO2, Ven 24 (*)    Calcium, Ion 1.13 (*)    HCT 27.0 (*)    Hemoglobin 9.2 (*)    All other components within normal limits  CBG MONITORING, ED - Abnormal; Notable for the following components:   Glucose-Capillary 63 (*)    All other components within normal limits  MRSA NEXT GEN BY PCR, NASAL  CULTURE, BLOOD (ROUTINE X 2)  CULTURE, BLOOD (ROUTINE X 2)  PROTIME-INR  BRAIN NATRIURETIC PEPTIDE  I-STAT CG4 LACTIC ACID, ED  CBG MONITORING, ED  I-STAT CG4 LACTIC ACID, ED  TROPONIN I (HIGH SENSITIVITY)  TROPONIN I (HIGH SENSITIVITY)    EKG EKG Interpretation Date/Time:  Tuesday August 11 2023 00:47:09 EDT Ventricular Rate:  127 PR Interval:  170 QRS Duration:  70 QT Interval:  276 QTC Calculation: 402 R Axis:   101  Text Interpretation: Sinus tachycardia Abnormal lateral Q waves Anterior infarct, old Rate faster Confirmed by Glynn Octave [phone]) on 08/11/2023 1:15:22 AM  Radiology DG Chest Portable 1 View  Result Date: 08/11/2023 CLINICAL DATA:  Follow-up pneumothorax. Shortness of breath. Dr. Attempted to aspirate pneumothorax. EXAM: PORTABLE CHEST 1 VIEW COMPARISON:  Radiograph 08/11/2023 at 1:11 a.m. FINDINGS: Slightly decreased size of the small to moderate right apical and lateral pneumothorax. Stable enlargement of the cardiomediastinal silhouette. Persistent infiltrates in the right upper and lower lung. No pleural  effusion. Chronic interstitial coarsening. No displaced rib fractures. IMPRESSION: Slight decrease in size of the right small-moderate pneumothorax. Electronically Signed   By: Minerva Fester M.D.   On: 08/11/2023 03:19   DG Chest Port 1 View  Addendum Date: 08/11/2023   ADDENDUM REPORT: 08/11/2023 01:45 ADDENDUM: These results were called by telephone at the time of interpretation on 08/11/2023 at 1:45 am to provider Glynn Octave , who verbally acknowledged these results. l Electronically Signed   By: Helyn Numbers M.D.   On: 08/11/2023 01:45   Result Date: 08/11/2023 CLINICAL DATA:  Dyspnea, sepsis EXAM: PORTABLE CHEST 1 VIEW COMPARISON:  07/24/2023 FINDINGS: The lungs are hyperinflated in keeping with changes of underlying COPD. Moderate right pneumothorax has developed. No radiographic evidence of tension. Extensive interstitial infiltrate within the right upper lobe appears improved in the interval since prior examination. Patchy infiltrate within the right mid lung zone right lung base persist. No pleural effusion. No pneumothorax on the left. Cardiac size is within normal limits. IMPRESSION: 1. Moderate right pneumothorax. No radiographic evidence of tension physiology. 2. COPD. 3. Improving right upper lobe infiltrate. Persistent patchy pneumonic infiltrate within the right mid and lower lung zone. Electronically Signed: By: Helyn Numbers M.D. On: 08/11/2023 01:37    Procedures .Critical Care  Performed by: Glynn Octave, MD Authorized by: Glynn Octave, MD   Critical care provider statement:    Critical care time (minutes):  45   Critical care time was exclusive of:  Separately billable procedures and treating other patients   Critical care was necessary to treat or prevent imminent or life-threatening deterioration of the following conditions:  Sepsis and respiratory failure   Critical care was time spent personally by me on the following activities:  Development of treatment  plan with patient or surrogate, discussions with consultants, evaluation of patient's response to treatment, examination of patient, ordering and review of laboratory studies, ordering and review of radiographic studies, ordering and performing treatments and interventions, pulse oximetry, re-evaluation of patient's condition, review of old charts, blood draw for specimens and obtaining history from patient or surrogate   I assumed direction of critical care for this patient from another provider in my specialty: no     Care discussed with: admitting provider       Medications Ordered in ED Medications  lactated ringers infusion (has no administration in time range)  lactated ringers bolus 1,000 mL (has no administration in time range)  vancomycin (VANCOCIN) IVPB 1000 mg/200 mL premix (has no administration in time range)  ceFEPIme (MAXIPIME) 2 g in sodium chloride 0.9 % 100 mL IVPB (has no administration in time range)    ED Course/ Medical Decision Making/ A&P                                 Medical Decision Making Amount and/or Complexity of Data Reviewed Independent Historian: EMS Labs: ordered. Decision-making details documented in ED Course. Radiology: ordered and independent interpretation performed. Decision-making details documented in ED Course. ECG/medicine tests: ordered and independent interpretation performed.  Decision-making details documented in ED Course.  Risk Prescription drug management. Decision regarding hospitalization.   Patient from facility with pneumonia and possible pneumothorax.  He was weaned from nonrebreather to 4 L of oxygen saturating 100%.  Code sepsis activated.  Patient started on broad-spectrum antibiotics and IV fluids after cultures obtained  X-ray does confirm approximately 20% right-sided pneumothorax.  No tension component.  Does have residual airspace disease as well.  Discussed with Dr. Judeth Horn of critical care.  He will evaluate  patient.  He attempted bedside aspiration of pneumothorax.  He agrees note chest tube necessary at this time.  Patient weaned to 4 L nasal cannula oxygen saturating 100%.  Lactate is normal.  Leukocytosis of 17.  Stable anemia. ABG without significant CO2 retention.   HR Improving with fluids. BP stable.  Work of breathing stable.   Continue IVF and IV antibiotics with concern for sepsis from aspiration pneumonia. Also SOB likely due to COVID and pneumothorax.   Admission d/w internal medicine residents.         Final Clinical Impression(s) / ED Diagnoses Final diagnoses:  Primary spontaneous pneumothorax  Sepsis with acute hypoxic respiratory failure without septic shock, due to unspecified organism [facility])    Rx / DC Orders ED Discharge Orders     None         Jaimon Bugaj, Jeannett Senior, MD 08/11/23 [id number]

## 2023-08-11 NOTE — Consult Note (Signed)
NAME:  [name], MRN:  [id number], DOB:  [birth year], LOS: 0 ADMISSION DATE:  08/11/2023, CONSULTATION DATE:  08/11/23  REFERRING MD:  ED , CHIEF COMPLAINT:  cough   History of Present Illness:  67 year old man chronic approximate respiratory failure recent admission required chest tube on the left presents from skilled nursing facility with reported hypoxemia although quickly weaned to baseline 4 L nasal cannula as well as pneumothorax whom we are consulted for evaluation of pneumothorax.  Diagnosed with COVID today.  Been coughing more.  URI symptoms runny nose nasal congestion.  No chest pain.  No worsening shortness of breath etc.  Chest x-ray obtained presumably because of COVID.  Demonstrated pneumothorax.  Repeated chest x-ray here in the ED reveals moderate pneumothorax.  Pertinent  Medical History  Chronic lung disease chronic hypoxemic respiratory failure  Significant Hospital Events: Including procedures, antibiotic start and stop dates in addition to other pertinent events   8/20 presents from facility with reported hypoxemia  Interim History / Subjective:    Objective   Blood pressure 131/76, pulse (!) 130, temperature 100.2 F (37.9 C), temperature source Rectal, resp. rate (!) 32, height 5\' 10"  (1.778 m), weight 86 kg, SpO2 100%.       No intake or output data in the 24 hours ending 08/11/23 0304 Filed Weights   08/11/23 0104  Weight: 86 kg    Examination: General: Lying in bed chronically ill-appearing HENT: EOMI, moist mucous membranes Lungs: Diminished on the right otherwise clear normal work of breathing on baseline 4 L nasal cannula Cardiovascular: Tachycardic, regular rhythm Abdomen: Nondistended, bowel sounds present Neuro: Lying in bed alert and oriented diffusely weak  Resolved Hospital Problem list     Assessment & Plan:  Secondary spontaneous pneumothorax: Recent coughing.  Reportedly COVID-positive.  Parenchymal lung disease scarring and  emphysema. -- Status post therapeutic aspiration with mild improvement in size of pneumothorax -- Repeat chest x-ray in the morning  Chronic hypoxemic respite failure: The send parenchymal lung disease described above. --Supplemental oxygen, goal O2 sat 88% or greater  Best Practice (right click and "Reselect all SmartList Selections" daily)   Per primary  Labs   CBC: No results for input(s): "WBC", "NEUTROABS", "HGB", "HCT", "MCV", "PLT" in the last 168 hours.  Basic Metabolic Panel: No results for input(s): "NA", "K", "CL", "CO2", "GLUCOSE", "BUN", "CREATININE", "CALCIUM", "MG", "PHOS" in the last 168 hours. GFR: Estimated Creatinine Clearance: 95.1 mL/min (A) (by C-G formula based on SCr of 0.58 mg/dL (L)). No results for input(s): "PROCALCITON", "WBC", "LATICACIDVEN" in the last 168 hours.  Liver Function Tests: No results for input(s): "AST", "ALT", "ALKPHOS", "BILITOT", "PROT", "ALBUMIN" in the last 168 hours. No results for input(s): "LIPASE", "AMYLASE" in the last 168 hours. No results for input(s): "AMMONIA" in the last 168 hours.  ABG    Component Value Date/Time   PHART 7.426 07/17/2020 0648   PCO2ART 29.1 (L) 07/17/2020 0648   PO2ART 79.0 (L) 07/17/2020 0648   HCO3 28.3 (H) 05/05/2023 1751   TCO2 17 (L) 06/18/2023 2027   ACIDBASEDEF 2.0 09/01/2022 2218   O2SAT 66 05/05/2023 1751     Coagulation Profile: No results for input(s): "INR", "PROTIME" in the last 168 hours.  Cardiac Enzymes: No results for input(s): "CKTOTAL", "CKMB", "CKMBINDEX", "TROPONINI" in the last 168 hours.  HbA1C: Hgb A1c MFr Bld  Date/Time Value Ref Range Status  03/27/2023 08:46 PM 5.7 (H) 4.8 - 5.6 % Final    Comment:    (NOTE)  Pre diabetes:          5.7%-6.4%  Diabetes:              >6.4%  Glycemic control for   <7.0% adults with diabetes   07/15/2020 03:00 PM 6.9 (H) 4.8 - 5.6 % Final    Comment:    (NOTE) Pre diabetes:          5.7%-6.4%  Diabetes:               >6.4%  Glycemic control for   <7.0% adults with diabetes     CBG: Recent Labs  Lab 08/11/23 0102  GLUCAP 78    Review of Systems:   Denies chest pain or shortness of breath.  Comprehensive review of systems otherwise negative.  Past Medical History:  He,  has a past medical history of Cellulitis (07/17/2020), Diabetes mellitus, Dyspnea, Hyperlipidemia, and Hypertension.   Surgical History:   Past Surgical History:  Procedure Laterality Date   BIOPSY  05/01/2023   Procedure: BIOPSY;  Surgeon: Beverley Fiedler, MD;  Location: [facility] ENDOSCOPY;  Service: Gastroenterology;;   ESOPHAGOGASTRODUODENOSCOPY (EGD) WITH PROPOFOL N/A 05/01/2023   Procedure: ESOPHAGOGASTRODUODENOSCOPY (EGD) WITH PROPOFOL;  Surgeon: Beverley Fiedler, MD;  Location: [facility] ENDOSCOPY;  Service: Gastroenterology;  Laterality: N/A;   FEMUR IM NAIL Left 03/27/2023   Procedure: INTRAMEDULLARY (IM) RETROGRADE FEMORAL NAILING;  Surgeon: Myrene Galas, MD;  Location: [facility] OR;  Service: Orthopedics;  Laterality: Left;   SPINE SURGERY       Social History:   reports that he has been smoking cigarettes. He has a 20 pack-year smoking history. He has never used smokeless tobacco. He reports that he does not drink alcohol and does not use drugs.   Family History:  His family history is not on file.   Allergies Allergies  Allergen Reactions   Lipitor [Atorvastatin Calcium] Other (See Comments)    Myalgias      Home Medications  Prior to Admission medications   Medication Sig Start Date End Date Taking? Authorizing Provider  acetaminophen (TYLENOL) 160 MG/5ML solution Take 20.3 mLs (650 mg total) by mouth every 6 (six) hours. 07/28/23   Monna Fam, MD  busPIRone (BUSPAR) 5 MG tablet Take 1 tablet (5 mg total) by mouth 3 (three) times daily. 07/28/23   Monna Fam, MD  collagenase Melburn Popper) 250 UNIT/GM ointment Apply topically daily. 07/29/23   Monna Fam, MD  feeding supplement (ENSURE ENLIVE / ENSURE PLUS) LIQD Take 237 mLs by  mouth 3 (three) times daily between meals. 06/29/23   Annett Fabian, MD  gabapentin (NEURONTIN) 800 MG tablet Take 800 mg by mouth 3 (three) times daily.    [provider]  HYDROmorphone (DILAUDID) 2 MG tablet Take 1 tablet (2 mg total) by mouth every 6 (six) hours. 07/28/23   Prosperi, Christian H, PA-C  hydrOXYzine (ATARAX) 25 MG tablet Take 1 tablet (25 mg total) by mouth 3 (three) times daily as needed for anxiety. 07/28/23   Monna Fam, MD  insulin aspart (NOVOLOG) 100 UNIT/ML injection Inject 0-15 Units into the skin 3 (three) times daily with meals. 07/28/23   Monna Fam, MD  insulin aspart (NOVOLOG) 100 UNIT/ML injection Inject 0-5 Units into the skin at bedtime. 07/28/23   Monna Fam, MD  insulin glargine-yfgn [facility]) 100 UNIT/ML injection Inject 0.05 mLs (5 Units total) into the skin daily. 07/29/23   Monna Fam, MD  ipratropium (ATROVENT) 0.02 % nebulizer solution Take 2.5 mLs (0.5 mg total) by nebulization every  4 (four) hours as needed for wheezing or shortness of breath. 07/28/23   Monna Fam, MD  lidocaine (LIDODERM) 5 % Place 1 patch onto the skin daily. Remove & Discard patch within 12 hours or as directed by MD 07/28/23   Monna Fam, MD  LORazepam (ATIVAN) 0.5 MG tablet Take 1 tablet (0.5 mg total) by mouth every 8 (eight) hours as needed (nerve provlem). 07/28/23   Monna Fam, MD  losartan (COZAAR) 25 MG tablet Take 25 mg by mouth daily.    [provider]  mirtazapine (REMERON SOL-TAB) 15 MG disintegrating tablet Take 1 tablet (15 mg total) by mouth at bedtime. 04/01/23 03/31/24  Morene Crocker, MD  Mouthwashes (MOUTH RINSE) LIQD solution 15 mLs by Mouth Rinse route daily. 07/28/23   Monna Fam, MD  Multiple Vitamin (MULTIVITAMIN WITH MINERALS) TABS tablet Take 1 tablet by mouth daily. 06/29/23   Annett Fabian, MD  oxyCODONE (OXY IR/ROXICODONE) 5 MG immediate release tablet Take 1 tablet (5 mg total) by mouth 2 (two) times daily. 07/28/23   Monna Fam, MD   pantoprazole (PROTONIX) 40 MG tablet Take 1 tablet (40 mg total) by mouth 2 (two) times daily before a meal. Patient not taking: Reported on 05/09/2023 05/04/23 06/03/23  Willette Cluster, MD  senna-docusate (SENOKOT-S) 8.6-50 MG tablet Take 1 tablet by mouth at bedtime. 05/08/23   Willette Cluster, MD  thiamine (VITAMIN B-1) 100 MG tablet Take 1 tablet (100 mg total) by mouth daily. 07/29/23   Monna Fam, MD     Critical care time: n/a

## 2023-08-11 NOTE — ED Notes (Signed)
Sacral wound repacked.

## 2023-08-11 NOTE — Progress Notes (Signed)
Pharmacy Antibiotic Note  [name] is a 67 y.o. male admitted on 08/11/2023 with  aspiration pneumonia .  Pharmacy has been consulted for Zosyn dosing.  Plan: Zosyn 3.375g IV q8h (4 hour infusion).  Height: 5\' 10"  (177.8 cm) Weight: 86 kg (189 lb 9.5 oz) IBW/kg (Calculated) : 73  Temp (24hrs), Avg:99.2 F (37.3 C), Min:98.1 F (36.7 C), Max:100.2 F (37.9 C)  Recent Labs  Lab 08/11/23 0258 08/11/23 0314 08/11/23 0506 08/11/23 0631  WBC 17.4*  --   --  17.0*  CREATININE 1.07 0.90  --  0.84  LATICACIDVEN  --  1.2 1.6  --     Estimated Creatinine Clearance: 90.5 mL/min (by C-G formula based on SCr of 0.84 mg/dL).    Allergies  Allergen Reactions   Lipitor [Atorvastatin Calcium] Other (See Comments)    Myalgias     Antimicrobials this admission: Remdesivir 8/20 >> 8/23 Vancomycin 8/20 >> 8/20  Microbiology results: 8/20 @ 0304 BCx: In process  Patient's vancomycin discontinued as per discussion with rounding team, with Dr. Hatcher/ID/IM Faculty Attending Physician. He wishes to continue the remdesivir for a total of three days and discontinue the cefepime and start zosyn for better aspiration  pneumonia coverage.    Thank you for allowing pharmacy to be a part of this patient's care.  Elicia Lamp, PharmD, CPP 08/11/2023 11:38 AM

## 2023-08-11 NOTE — ED Notes (Signed)
MD made aware of BG of 63. Awaiting for orders at this time. Pt a&ox4.

## 2023-08-11 NOTE — Evaluation (Signed)
Clinical/Bedside Swallow Evaluation Patient Details  Name: RANFERI SALMERON MRN: [id number] Date of Birth: [birth year]  Today's Date: 08/11/2023 Time: SLP Start Time (ACUTE ONLY): 0850 SLP Stop Time (ACUTE ONLY): 0900 SLP Time Calculation (min) (ACUTE ONLY): 10 min  Past Medical History:  Past Medical History:  Diagnosis Date   Cellulitis 07/17/2020   LEFT HIP   Diabetes mellitus    Dyspnea    Hyperlipidemia    Hypertension    Past Surgical History:  Past Surgical History:  Procedure Laterality Date   BIOPSY  05/01/2023   Procedure: BIOPSY;  Surgeon: Beverley Fiedler, MD;  Location: [facility] ENDOSCOPY;  Service: Gastroenterology;;   ESOPHAGOGASTRODUODENOSCOPY (EGD) WITH PROPOFOL N/A 05/01/2023   Procedure: ESOPHAGOGASTRODUODENOSCOPY (EGD) WITH PROPOFOL;  Surgeon: Beverley Fiedler, MD;  Location: [facility] ENDOSCOPY;  Service: Gastroenterology;  Laterality: N/A;   FEMUR IM NAIL Left 03/27/2023   Procedure: INTRAMEDULLARY (IM) RETROGRADE FEMORAL NAILING;  Surgeon: Myrene Galas, MD;  Location: [facility] OR;  Service: Orthopedics;  Laterality: Left;   SPINE SURGERY     HPI:  Pt is a 67 year old chronically ill male with Pmhx of HTN, DMII, paraplegia of the lower extremities, protein calorie malnutrition c/b by sarcopenia, Afib, aspiration pneumonia who presented with worsening shortness of breath. He was noted to be COVID positive at the SNF. CXR done showed pt with a pneumothorax and a right middle/lower lobe infiltrate. MBS 07/22/23, mild pharyngeal residual, two instances of flash, trace penetration, no aspiration; full liquids recommended. F/u SLP session recommended regular texture/thin liquids.    Assessment / Plan / Recommendation  Clinical Impression  Pt has Covid dx and is coughing prior to po a wet congested cough. He also is coughing immediatley and delayed with thin, puree and nectar thick liquids making it difficult to discern etiology of cough. He states a pharyngeal globus sensation following  applesauce. He has a history of dysphagia and currently has pna. SLP recommends an instrumental assessment scheduled for 3:30 today. He may have meds crushed in applesauce until MBS but otherwise recommend continue NPO. SLP Visit Diagnosis: Dysphagia, unspecified (R13.10)    Aspiration Risk  Moderate aspiration risk    Diet Recommendation NPO except meds (crush pills)    Medication Administration: Crushed with puree    Other  Recommendations      Recommendations for follow up therapy are one component of a multi-disciplinary discharge planning process, led by the attending physician.  Recommendations may be updated based on patient status, additional functional criteria and insurance authorization.  Follow up Recommendations        Assistance Recommended at Discharge    Functional Status Assessment    Frequency and Duration            Prognosis        Swallow Study   General Date of Onset: 08/11/23 HPI: Pt is a 67 year old chronically ill male with Pmhx of HTN, DMII, paraplegia of the lower extremities, protein calorie malnutrition c/b by sarcopenia, Afib, aspiration pneumonia who presented with worsening shortness of breath. He was noted to be COVID positive at the SNF. CXR done showed pt with a pneumothorax and a right middle/lower lobe infiltrate. MBS 07/22/23, mild pharyngeal residual, two instances of flash, trace penetration, no aspiration; full liquids recommended. F/u SLP session recommended regular texture/thin liquids. Type of Study: Bedside Swallow Evaluation Previous Swallow Assessment:  (see HPI) Diet Prior to this Study: NPO Temperature Spikes Noted: No Respiratory Status: Nasal cannula History of Recent Intubation: No  Behavior/Cognition: Alert;Cooperative;Pleasant mood Oral Cavity Assessment: Dry Oral Care Completed by SLP: No Oral Cavity - Dentition: Adequate natural dentition Vision: Functional for self-feeding Self-Feeding Abilities: Able to feed  self Patient Positioning: Upright in bed Baseline Vocal Quality: Normal Volitional Cough: Strong;Congested Volitional Swallow: Able to elicit    Oral/Motor/Sensory Function Overall Oral Motor/Sensory Function: Within functional limits   Ice Chips Ice chips: Not tested   Thin Liquid Thin Liquid: Impaired Presentation: Cup;Straw Pharyngeal  Phase Impairments: Cough - Immediate;Cough - Delayed    Nectar Thick Nectar Thick Liquid: Impaired Presentation: Cup Pharyngeal Phase Impairments: Cough - Immediate;Cough - Delayed   Honey Thick Honey Thick Liquid: Not tested   Puree Puree: Impaired Pharyngeal Phase Impairments: Cough - Delayed   Solid     Solid: Within functional limits      Royce Macadamia 08/11/2023,9:16 AM

## 2023-08-11 NOTE — ED Triage Notes (Signed)
Chief Complaint  Patient presents with   Shortness of Breath   Pt presents to ED 20 via EMS from [facility] with above.  Per report, pt had appointment today and tested positive for Covid.  Pt also found to have pneumonia and pneumothorax.  Per EMS, pt was in the 70s on room air and improved to mid 90's with non-rebreather.  Pt has large sacral wound.

## 2023-08-11 NOTE — ED Notes (Signed)
Delay in cultures and ordered medications due to difficult IV access.

## 2023-08-11 NOTE — Consult Note (Signed)
Palliative Medicine Inpatient Consult Note  Consulting Provider:  Gwenevere Abbot, MD   Reason for consult:   Palliative Care Consult Services Palliative Medicine Consult  Reason for Consult? GOC, significantly ill male presenting with repeated aspiration pneumonia.   08/11/2023  HPI:  Per intake H&P --> [name] is a 67 y.o. with a pertinent PMH of T2DM, HTN, Afib, paraplegia, severe deconditioning/malnutrition who presented with shortness of breath and admitted for Covid, aspiration pneumonia, pneumothorax. The Palliative care team has been asked to get involved for additional goals of care conversations.   Clinical Assessment/Goals of Care:  *Please note that this is a verbal dictation therefore any spelling or grammatical errors are due to the "[employer] One" system interpretation.  I have reviewed medical records including EPIC notes, labs and imaging, received report from bedside RN, assessed the patient who is lying in bed alert to self and place.    I met with [name] and his brother, [name] this afternoon to further discuss diagnosis prognosis, GOC, EOL wishes, disposition and options.   I introduced Palliative Medicine as specialized medical care for people living with serious illness. It focuses on providing relief from the symptoms and stress of a serious illness. The goal is to improve quality of life for both the patient and the family.  Medical History Review and Understanding:  [name]'s past medical history of T2DM, HTN, Afib, paraplegia, and malnutrition was held.   Social History:  [name], [name]. He has never been married and has no children. He use to lay concrete. He is a man of the [place] faith.   Functional and Nutritional State:  Preceding hospitalization [name] was in the SNF setting dependent for care. He has been able to eat.  Advance Directives:  A detailed discussion was had today regarding advanced directives.   Patients brother, [name] helps with decision making.   Code Status:  Concepts specific to code status, artifical feeding and hydration, continued IV antibiotics and rehospitalization was had.  The difference between a aggressive medical intervention path  and a palliative comfort care path for this patient at this time was had.   Continue DNAR/DNI.  Provided  "Hard Choices for [employer]" booklet.   Discussion:  Discussed concerns in the setting of Yovanny's ongoing aspirational risk. We reviewed that aspiration can be complicated as we get older and often time difficult in terms of correcting the mechanical issue's associated with it. I shared more often than not patients have continued aspirational events leading to cyclic hospitalizations - a pattern it appears we are clearly in. We reviewed that a PEG tube is a consideration if the goals remain towards aggressive modalities though this will not repair the underlying issues at hand.  [name] shares that he does not desire a PEG tube. He wants to eat and drink despite the risk. I shared it's necessary to think about this as again, we can't fix his ongoing aspirational risk and he will get another pneumonia sooner than later.  I additionally discussed [name]'s significant physical decline and his pressure injury. I shared that once your integumentary system is effected in circumstances such as his we can expect more pressure wounds or worsening of the present one.   We reviewed the idea of hospice care, I shared openly and honestly that this would be a reasonable consideration at this time. [name] expresses needing time to think about it.    Discussed the importance of continued conversation with family and  their  medical providers regarding overall plan of care and treatment options, ensuring decisions are within the context of the patients values and GOCs.  Decision Maker: [name], [name] (Brother): [phone] ([place])   SUMMARY OF  RECOMMENDATIONS   DNAR/DNI, patient does not desire a PEG tube  I spoke to Adiel about his present health state and the concern moving forward about recurrent aspirational events  Discussed the differences between present level of care and comfort care  Saben would like some time to think about his options  Plan for ongoing supportive care  Code Status/Advance Care Planning: DNAR/DNI  Palliative Prophylaxis:  Aspiration, Bowel Regimen, Delirium Protocol, Frequent Pain Assessment, Oral Care, Palliative Wound Care, and Turn Reposition  Additional Recommendations (Limitations, Scope, Preferences): Continue current care  Psycho-social/Spiritual:  Desire for further Chaplaincy support: [facility] Additional Recommendations: Education on aspiration pneumonia   Prognosis: Very poor in the setting of underlying chronic disease burden and aspiration risk  Discharge Planning: Unclear at this time.   Vitals:   08/11/23 1143 08/11/23 1212  BP: 104/61 120/63  Pulse: (!) 110 98  Resp: 20 (!) 22  Temp: 97.8 F (36.6 C) 97.6 F (36.4 C)  SpO2: 99% 100%    Intake/Output Summary (Last 24 hours) at 08/11/2023 1302 Last data filed at 08/11/2023 0981 Gross per 24 hour  Intake 3265.13 ml  Output --  Net 3265.13 ml   Last Weight  Most recent update: 08/11/2023  1:04 AM    Weight  86 kg (189 lb 9.5 oz)             Gen:  Frail elderly AA M in NAD HEENT: moist mucous membranes CV: Irregular rate and regular rhythm  PULM: On 4LPM [place], breathing is even and nonlabored  ABD: soft/nontender  EXT: Muscle wasting Neuro: Alert and oriented x2  PPS: 30%   This conversation/these recommendations were discussed with patient primary care team, Dr. Ninetta Lights  Billing based on MDM: High ______________________________________________________ Lamarr Lulas [facility] Palliative Medicine Team Team Cell Phone: [phone] Please utilize secure chat with additional questions, if  there is no response within 30 minutes please call the above phone number  Palliative Medicine Team providers are available by phone from 7am to 7pm daily and can be reached through the team cell phone.  Should this patient require assistance outside of these hours, please call the patient's attending physician.

## 2023-08-11 NOTE — ED Notes (Signed)
ED TO INPATIENT HANDOFF REPORT  ED Nurse Name and Phone #: Nehemiah Settle 24  S Name/Age/Gender [name] 67 y.o. male Room/Bed: 005C/005C  Code Status   Code Status: DNR  Home/SNF/Other Skilled nursing facility Patient oriented to: self and place Is this baseline? Yes   Triage Complete: Triage complete  Chief Complaint Acute hypoxic respiratory failure (HCC) [J96.01]  Triage Note Chief Complaint  Patient presents with   Shortness of Breath   Pt presents to ED 20 via EMS from [facility] with above.  Per report, pt had appointment today and tested positive for Covid.  Pt also found to have pneumonia and pneumothorax.  Per EMS, pt was in the 70s on room air and improved to mid 90's with non-rebreather.  Pt has large sacral wound.   Allergies Allergies  Allergen Reactions   Lipitor [Atorvastatin Calcium] Other (See Comments)    Myalgias     Level of Care/Admitting Diagnosis ED Disposition     ED Disposition  Admit   Condition  --   Comment  Hospital Area: [facility] [[id number]]  Level of Care: Progressive [102]  Admit to Progressive based on following criteria: MULTISYSTEM THREATS such as stable sepsis, metabolic/electrolyte imbalance with or without encephalopathy that is responding to early treatment.  May admit patient to [facility] or [facility] if equivalent level of care is available:: No  Covid Evaluation: Confirmed COVID Positive  Diagnosis: Acute hypoxic respiratory failure [facility]) [[id number]]  Admitting Physician: Ginnie Smart [[id number]]  Attending Physician: Ninetta Lights, JEFFREY C [[id number]]  Certification:: I certify this patient will need inpatient services for at least 2 midnights  Expected Medical Readiness: 08/14/2023          B Medical/Surgery History Past Medical History:  Diagnosis Date   Cellulitis 07/17/2020   LEFT HIP   Diabetes mellitus    Dyspnea    Hyperlipidemia    Hypertension    Past Surgical History:   Procedure Laterality Date   BIOPSY  05/01/2023   Procedure: BIOPSY;  Surgeon: Beverley Fiedler, MD;  Location: [facility] ENDOSCOPY;  Service: Gastroenterology;;   ESOPHAGOGASTRODUODENOSCOPY (EGD) WITH PROPOFOL N/A 05/01/2023   Procedure: ESOPHAGOGASTRODUODENOSCOPY (EGD) WITH PROPOFOL;  Surgeon: Beverley Fiedler, MD;  Location: [facility] ENDOSCOPY;  Service: Gastroenterology;  Laterality: N/A;   FEMUR IM NAIL Left 03/27/2023   Procedure: INTRAMEDULLARY (IM) RETROGRADE FEMORAL NAILING;  Surgeon: Myrene Galas, MD;  Location: [facility] OR;  Service: Orthopedics;  Laterality: Left;   SPINE SURGERY       A IV Location/Drains/Wounds Patient Lines/Drains/Airways Status     Active Line/Drains/Airways     Name Placement date Placement time Site Days   Peripheral IV 08/11/23 20 G 1.88" Right Antecubital 08/11/23  0245  Antecubital  less than 1   Peripheral IV 08/11/23 22 G 1.75" Left;Anterior Forearm 08/11/23  0929  Forearm  less than 1            Intake/Output Last 24 hours  Intake/Output Summary (Last 24 hours) at 08/11/2023 1810 Last data filed at 08/11/2023 1504 Gross per 24 hour  Intake 3315.13 ml  Output --  Net 3315.13 ml    Labs/Imaging Results for orders placed or performed during the hospital encounter of 08/11/23 (from the past 48 hour(s))  Resp panel by RT-PCR (RSV, Flu A&B, Covid) Anterior Nasal Swab     Status: Abnormal   Collection Time: 08/11/23 12:50 AM   Specimen: Anterior Nasal Swab  Result Value Ref Range   SARS Coronavirus  2 by RT PCR POSITIVE (A) NEGATIVE   Influenza A by PCR NEGATIVE NEGATIVE   Influenza B by PCR NEGATIVE NEGATIVE    Comment: (NOTE) The Xpert Xpress SARS-CoV-2/FLU/RSV plus assay is intended as an aid in the diagnosis of influenza from Nasopharyngeal swab specimens and should not be used as a sole basis for treatment. Nasal washings and aspirates are unacceptable for Xpert Xpress SARS-CoV-2/FLU/RSV testing.  Fact Sheet for  Patients: BloggerCourse.com  Fact Sheet for Healthcare Providers: SeriousBroker.it  This test is not yet approved or cleared by the [place] FDA and has been authorized for detection and/or diagnosis of SARS-CoV-2 by FDA under an Emergency Use Authorization (EUA). This EUA will remain in effect (meaning this test can be used) for the duration of the COVID-19 declaration under Section 564(b)(1) of the Act, 21 U.S.C. section 360bbb-3(b)(1), unless the authorization is terminated or revoked.     Resp Syncytial Virus by PCR NEGATIVE NEGATIVE    Comment: (NOTE) Fact Sheet for Patients: BloggerCourse.com  Fact Sheet for Healthcare Providers: SeriousBroker.it  This test is not yet approved or cleared by the [place] FDA and has been authorized for detection and/or diagnosis of SARS-CoV-2 by FDA under an Emergency Use Authorization (EUA). This EUA will remain in effect (meaning this test can be used) for the duration of the COVID-19 declaration under Section 564(b)(1) of the Act, 21 U.S.C. section 360bbb-3(b)(1), unless the authorization is terminated or revoked.  Performed at [facility], [address], [place], [place] [address]   CBG monitoring, ED     Status: None   Collection Time: 08/11/23  1:02 AM  Result Value Ref Range   Glucose-Capillary 78 70 - 99 mg/dL    Comment: Glucose reference range applies only to samples taken after fasting for at least 8 hours.  Urinalysis, w/ Reflex to Culture (Infection Suspected) -Urine, Clean Catch     Status: Abnormal   Collection Time: 08/11/23  1:32 AM  Result Value Ref Range   Specimen Source URINE, CLEAN CATCH    Color, Urine AMBER (A) YELLOW    Comment: BIOCHEMICALS MAY BE AFFECTED BY COLOR   APPearance CLOUDY (A) CLEAR   Specific Gravity, Urine 1.026 1.005 - 1.030   pH 5.0 5.0 - 8.0   Glucose, UA NEGATIVE NEGATIVE  mg/dL   Hgb urine dipstick NEGATIVE NEGATIVE   Bilirubin Urine SMALL (A) NEGATIVE   Ketones, ur 5 (A) NEGATIVE mg/dL   Protein, ur 30 (A) NEGATIVE mg/dL   Nitrite NEGATIVE NEGATIVE   Leukocytes,Ua NEGATIVE NEGATIVE   RBC / HPF 0-5 0 - 5 RBC/hpf   WBC, UA 0-5 0 - 5 WBC/hpf    Comment:        Reflex urine culture not performed if WBC <=10, OR if Squamous epithelial cells >5. If Squamous epithelial cells >5 suggest recollection.    Bacteria, UA RARE (A) NONE SEEN   Squamous Epithelial / HPF 6-10 0 - 5 /HPF   Mucus PRESENT    Hyaline Casts, UA PRESENT    Granular Casts, UA PRESENT    Amorphous Crystal PRESENT     Comment: Performed at [facility], [address], [place], [place] [address]  Comprehensive metabolic panel     Status: Abnormal   Collection Time: 08/11/23  2:58 AM  Result Value Ref Range   Sodium 139 135 - 145 mmol/L   Potassium 4.8 3.5 - 5.1 mmol/L   Chloride 102 98 - 111 mmol/L  CO2 24 22 - 32 mmol/L   Glucose, Bld 97 70 - 99 mg/dL    Comment: Glucose reference range applies only to samples taken after fasting for at least 8 hours.   BUN 38 (H) 8 - 23 mg/dL   Creatinine, Ser 1.61 0.61 - 1.24 mg/dL   Calcium 8.4 (L) 8.9 - 10.3 mg/dL   Total Protein 6.2 (L) 6.5 - 8.1 g/dL   Albumin 1.8 (L) 3.5 - 5.0 g/dL   AST 35 15 - 41 U/L   ALT 29 0 - 44 U/L   Alkaline Phosphatase 64 38 - 126 U/L   Total Bilirubin 0.6 0.3 - 1.2 mg/dL   GFR, Estimated >09 >60 mL/min    Comment: (NOTE) Calculated using the CKD-EPI Creatinine Equation (2021)    Anion gap 13 5 - 15    Comment: Performed at [facility], [address] [place], [place] [address]  CBC with Differential     Status: Abnormal   Collection Time: 08/11/23  2:58 AM  Result Value Ref Range   WBC 17.4 (H) 4.0 - 10.5 K/uL   RBC 3.13 (L) 4.22 - 5.81 MIL/uL   Hemoglobin 8.4 (L) 13.0 - 17.0 g/dL   HCT 81.1 (L) 91.4 - 78.2 %   MCV 89.5 80.0 - 100.0 fL   MCH 26.8 26.0 - 34.0 pg   MCHC 30.0 30.0 - 36.0  g/dL   RDW 95.6 (H) 21.3 - 08.6 %   Platelets 461 (H) 150 - 400 K/uL   nRBC 0.0 0.0 - 0.2 %   Neutrophils Relative % 90 %   Neutro Abs 15.9 (H) 1.7 - 7.7 K/uL   Lymphocytes Relative 4 %   Lymphs Abs 0.6 (L) 0.7 - 4.0 K/uL   Monocytes Relative 4 %   Monocytes Absolute 0.7 0.1 - 1.0 K/uL   Eosinophils Relative 0 %   Eosinophils Absolute 0.0 0.0 - 0.5 K/uL   Basophils Relative 1 %   Basophils Absolute 0.1 0.0 - 0.1 K/uL   Immature Granulocytes 1 %   Abs Immature Granulocytes 0.12 (H) 0.00 - 0.07 K/uL    Comment: Performed at [facility], [address], [place], [place] [address]  Protime-INR     Status: None   Collection Time: 08/11/23  2:58 AM  Result Value Ref Range   Prothrombin Time 15.1 11.4 - 15.2 seconds   INR 1.2 0.8 - 1.2    Comment: (NOTE) INR goal varies based on device and disease states. Performed at [facility], [address] [place], [place] [address]   APTT     Status: Abnormal   Collection Time: 08/11/23  2:58 AM  Result Value Ref Range   aPTT 39 (H) 24 - 36 seconds    Comment:        IF BASELINE aPTT IS ELEVATED, SUGGEST PATIENT RISK ASSESSMENT BE USED TO DETERMINE APPROPRIATE ANTICOAGULANT THERAPY. Performed at [facility] Lab, [address] [place], [place] [address]   Brain natriuretic peptide     Status: None   Collection Time: 08/11/23  2:58 AM  Result Value Ref Range   B Natriuretic Peptide 40.4 0.0 - 100.0 pg/mL    Comment: Performed at [facility], [address], [place], [place] [address]  Troponin I (High Sensitivity)     Status: None   Collection Time: 08/11/23  2:58 AM  Result Value Ref Range   Troponin I (High Sensitivity) 13 <18 ng/L  Comment: (NOTE) Elevated high sensitivity troponin I (hsTnI) values and significant  changes across serial measurements may suggest ACS but many other  chronic and acute conditions are known to elevate hsTnI results.  Refer to the "Links" section for chest pain algorithms and  additional  guidance. Performed at [facility], [address] [place], [place] [address]   I-Stat venous blood gas, ED ([facility])     Status: Abnormal   Collection Time: 08/11/23  3:13 AM  Result Value Ref Range   pH, Ven 7.389 7.25 - 7.43   pCO2, Ven 44.7 44 - 60 mmHg   pO2, Ven 24 (LL) 32 - 45 mmHg   Bicarbonate 27.0 20.0 - 28.0 mmol/L   TCO2 28 22 - 32 mmol/L   O2 Saturation 42 %   Acid-Base Excess 2.0 0.0 - 2.0 mmol/L   Sodium 139 135 - 145 mmol/L   Potassium 4.9 3.5 - 5.1 mmol/L   Calcium, Ion 1.13 (L) 1.15 - 1.40 mmol/L   HCT 27.0 (L) 39.0 - 52.0 %   Hemoglobin 9.2 (L) 13.0 - 17.0 g/dL   Sample type VENOUS    Comment NOTIFIED PHYSICIAN   I-Stat Lactic Acid, ED     Status: None   Collection Time: 08/11/23  3:14 AM  Result Value Ref Range   Lactic Acid, Venous 1.2 0.5 - 1.9 mmol/L  I-Stat Chem 8, ED     Status: Abnormal   Collection Time: 08/11/23  3:14 AM  Result Value Ref Range   Sodium 139 135 - 145 mmol/L   Potassium 5.0 3.5 - 5.1 mmol/L   Chloride 104 98 - 111 mmol/L   BUN 39 (H) 8 - 23 mg/dL   Creatinine, Ser 9.14 0.61 - 1.24 mg/dL   Glucose, Bld 94 70 - 99 mg/dL    Comment: Glucose reference range applies only to samples taken after fasting for at least 8 hours.   Calcium, Ion 1.11 (L) 1.15 - 1.40 mmol/L   TCO2 26 22 - 32 mmol/L   Hemoglobin 9.9 (L) 13.0 - 17.0 g/dL   HCT 78.2 (L) 95.6 - 21.3 %  MRSA Next Gen by PCR, Nasal     Status: None   Collection Time: 08/11/23  4:45 AM   Specimen: Nasal Mucosa; Nasal Swab  Result Value Ref Range   MRSA by PCR Next Gen NOT DETECTED NOT DETECTED    Comment: (NOTE) The GeneXpert MRSA Assay (FDA approved for NASAL specimens only), is one component of a comprehensive MRSA colonization surveillance program. It is not intended to diagnose MRSA infection nor to guide or monitor treatment for MRSA infections. Test performance is not FDA approved in patients less than 59 years old. Performed at [facility]  Lab, [address], [place], [place] [address]   Troponin I (High Sensitivity)     Status: None   Collection Time: 08/11/23  4:45 AM  Result Value Ref Range   Troponin I (High Sensitivity) 12 <18 ng/L    Comment: (NOTE) Elevated high sensitivity troponin I (hsTnI) values and significant  changes across serial measurements may suggest ACS but many other  chronic and acute conditions are known to elevate hsTnI results.  Refer to the "Links" section for chest pain algorithms and additional  guidance. Performed at [facility], [address] [place], [place] [address]   I-Stat Lactic Acid, ED     Status: None   Collection Time: 08/11/23  5:06 AM  Result Value Ref  Range   Lactic Acid, Venous 1.6 0.5 - 1.9 mmol/L  CBC     Status: Abnormal   Collection Time: 08/11/23  6:31 AM  Result Value Ref Range   WBC 17.0 (H) 4.0 - 10.5 K/uL   RBC 2.82 (L) 4.22 - 5.81 MIL/uL   Hemoglobin 7.5 (L) 13.0 - 17.0 g/dL   HCT 09.8 (L) 11.9 - 14.7 %   MCV 89.0 80.0 - 100.0 fL   MCH 26.6 26.0 - 34.0 pg   MCHC 29.9 (L) 30.0 - 36.0 g/dL   RDW 82.9 (H) 56.2 - 13.0 %   Platelets 410 (H) 150 - 400 K/uL   nRBC 0.0 0.0 - 0.2 %    Comment: Performed at [facility] Lab, [address] [place], [place] [address]  Basic metabolic panel     Status: Abnormal   Collection Time: 08/11/23  6:31 AM  Result Value Ref Range   Sodium 138 135 - 145 mmol/L   Potassium 4.5 3.5 - 5.1 mmol/L   Chloride 105 98 - 111 mmol/L   CO2 24 22 - 32 mmol/L   Glucose, Bld 84 70 - 99 mg/dL    Comment: Glucose reference range applies only to samples taken after fasting for at least 8 hours.   BUN 33 (H) 8 - 23 mg/dL   Creatinine, Ser 4.69 0.61 - 1.24 mg/dL   Calcium 7.6 (L) 8.9 - 10.3 mg/dL   GFR, Estimated >62 >95 mL/min    Comment: (NOTE) Calculated using the CKD-EPI Creatinine Equation (2021)    Anion gap 9 5 - 15    Comment: Performed at [facility], [address] [place], [place] [address]  CBG monitoring, ED      Status: Abnormal   Collection Time: 08/11/23  7:46 AM  Result Value Ref Range   Glucose-Capillary 63 (L) 70 - 99 mg/dL    Comment: Glucose reference range applies only to samples taken after fasting for at least 8 hours.   Comment 1 Notify RN   CBG monitoring, ED     Status: Abnormal   Collection Time: 08/11/23  9:02 AM  Result Value Ref Range   Glucose-Capillary 50 (L) 70 - 99 mg/dL    Comment: Glucose reference range applies only to samples taken after fasting for at least 8 hours.  CBG monitoring, ED     Status: None   Collection Time: 08/11/23  9:51 AM  Result Value Ref Range   Glucose-Capillary 71 70 - 99 mg/dL    Comment: Glucose reference range applies only to samples taken after fasting for at least 8 hours.  CBG monitoring, ED     Status: Abnormal   Collection Time: 08/11/23 11:39 AM  Result Value Ref Range   Glucose-Capillary 111 (H) 70 - 99 mg/dL    Comment: Glucose reference range applies only to samples taken after fasting for at least 8 hours.  CBG monitoring, ED     Status: Abnormal   Collection Time: 08/11/23 12:09 PM  Result Value Ref Range   Glucose-Capillary 122 (H) 70 - 99 mg/dL    Comment: Glucose reference range applies only to samples taken after fasting for at least 8 hours.  CBG monitoring, ED     Status: None   Collection Time: 08/11/23  5:42 PM  Result Value Ref Range   Glucose-Capillary 93 70 - 99 mg/dL    Comment: Glucose reference range applies only to samples taken after fasting for at least 8  hours.   DG Swallowing Func-Speech Pathology  Result Date: 08/11/2023 Blaine Hamper     08/11/2023  5:49 PM Modified Barium Swallow Study Patient Details Name: [name] MRN: [id number] Date of Birth: [birth year] Today's Date: 08/11/2023 HPI/PMH: HPI: Pt is a 67 year old chronically ill male with Pmhx of HTN, DMII, paraplegia of the lower extremities, protein calorie malnutrition c/b by sarcopenia, Afib, aspiration pneumonia who presented with  worsening shortness of breath. He was noted to be COVID positive at the SNF. CXR done showed pt with a pneumothorax and a right middle/lower lobe infiltrate. MBS 07/22/23, mild pharyngeal residual, two instances of flash, trace penetration, no aspiration; full liquids recommended. F/u SLP session recommended regular texture/thin liquids.  Patient discharged to SNF on 07/28/23, but readmitted to hospital 08/11/23 with worsening SOB, Covid+. Clinical Impression: Clinical Impression: Patient's dysphagia appears worsened as compared to most recent MBS on 07/22/23, however it appears similar to MBS completed on 07/17/23. SLP suspects that patient's baseline dysphagia becomes exacerbated when his health deteriorates/declines and currently he has Covid-19 infection. During today's MBS, positioning of patient in chair in radiology suite was challenging and head was tilted back during PO intake. SLP does suspect this had impact on his function as patient with observed cervical osteophytes on previous MBS which were preventing epiglottic inversion. PO's of puree, thin and nectar thick liquids were administered. With all boluses, majority of barium entered into pyriform sinus but with very little PES opening. This led to moderate-maximal amount of PO residuals as well as sensed penetration and aspiration of all consistencies. Cued dry swallows helped to clear some of residuals but reduced overall only to moderate amount. Cued throat clear was effective to transit penetrate out of laryngeal vestibule, however repeated penetration and aspiration occured from residuals which remained in pyriform sinus. SLP recommending continue NPO status at this time and will follow patient for PO trials and patient/family education. Factors that may increase risk of adverse event in presence of aspiration [name] & [name]): Factors that may increase risk of adverse event in presence of aspiration [name] & [name]): Poor general health  and/or compromised immunity; Dependence for feeding and/or oral hygiene; Frail or deconditioned; Limited mobility; Weak cough Recommendations/Plan: Swallowing Evaluation Recommendations Swallowing Evaluation Recommendations Recommendations: NPO Medication Administration: Via alternative means Oral care recommendations: Staff/trained caregiver to provide oral care; Oral care QID (4x/day) Recommended consults: Consider Palliative care Treatment Plan Treatment Plan Treatment recommendations: Therapy as outlined in treatment plan below Follow-up recommendations: Skilled nursing-short term rehab (<3 hours/day) Functional status assessment: Patient has had a recent decline in their functional status and/or demonstrates limited ability to make significant improvements in function in a reasonable and predictable amount of time. Treatment frequency: Min 2x/week Treatment duration: 2 weeks Interventions: Aspiration precaution training; Oropharyngeal exercises; Patient/family education; Trials of upgraded texture/liquids; Respiratory muscle strength training; Diet toleration management by SLP Recommendations Recommendations for follow up therapy are one component of a multi-disciplinary discharge planning process, led by the attending physician.  Recommendations may be updated based on patient status, additional functional criteria and insurance authorization. Assessment: Orofacial Exam: Orofacial Exam Oral Cavity: Oral Hygiene: WFL Oral Cavity - Dentition: Adequate natural dentition Orofacial Anatomy: WFL Oral Motor/Sensory Function: Generalized oral weakness Anatomy: Anatomy: Presence of cervical hardware; Suspected cervical osteophytes Boluses Administered: Boluses Administered Boluses Administered: Thin liquids (Level 0); Mildly thick liquids (Level 2, nectar thick); Moderately thick liquids (Level 3, honey thick); Puree; Solid  Oral Impairment Domain: Oral Impairment Domain  Lip Closure: No labial escape Tongue control  during bolus hold: Cohesive bolus between tongue to palatal seal Bolus transport/lingual motion: Brisk tongue motion Oral residue: Trace residue lining oral structures Location of oral residue : Tongue Initiation of pharyngeal swallow : Pyriform sinuses  Pharyngeal Impairment Domain: Pharyngeal Impairment Domain Laryngeal elevation: Complete superior movement of thyroid cartilage with complete approximation of arytenoids to epiglottic petiole Anterior hyoid excursion: Complete anterior movement Epiglottic movement: Partial inversion Laryngeal vestibule closure: Incomplete, narrow column air/contrast in laryngeal vestibule Pharyngeal stripping wave : Present - diminished Pharyngeal contraction (A/P view only): N/A Pharyngoesophageal segment opening: Minimal distention/minimal duration, marked obstruction of flow Tongue base retraction: Narrow column of contrast or air between tongue base and PPW Pharyngeal residue: Collection of residue within or on pharyngeal structures Location of pharyngeal residue: Valleculae; Pyriform sinuses; Aryepiglottic folds  Esophageal Impairment Domain: Esophageal Impairment Domain Esophageal clearance upright position: Complete clearance, esophageal coating Pill: No data recorded Penetration/Aspiration Scale Score: Penetration/Aspiration Scale Score 7.  Material enters airway, passes BELOW cords and not ejected out despite cough attempt by patient: Puree; Mildly thick liquids (Level 2, nectar thick); Thin liquids (Level 0) Compensatory Strategies: Compensatory Strategies Compensatory strategies: No   General Information: Caregiver present: No  Diet Prior to this Study: NPO   Temperature : Normal   Respiratory Status: WFL   Supplemental O2: Nasal cannula   History of Recent Intubation: No  Behavior/Cognition: Alert; Cooperative; Pleasant mood Self-Feeding Abilities: Dependent for feeding Baseline vocal quality/speech: Normal Volitional Cough: Able to elicit Volitional Swallow: Able to  elicit Exam Limitations: Poor positioning Goal Planning: Prognosis for improved oropharyngeal function: Fair Barriers to Reach Goals: Time post onset; Severity of deficits No data recorded No data recorded Consulted and agree with results and recommendations: Patient Pain: Pain Assessment Pain Assessment: Faces Faces Pain Scale: 2 Pain Location: legs Pain Descriptors / Indicators: Discomfort Pain Intervention(s): Limited activity within patient's tolerance End of Session: Start Time:SLP Start Time (ACUTE ONLY): 1520 Stop Time: SLP Stop Time (ACUTE ONLY): 1540 Time Calculation:SLP Time Calculation (min) (ACUTE ONLY): 20 min Charges: SLP Evaluations $ SLP Speech Visit: 1 Visit SLP Evaluations $BSS Swallow: 1 Procedure $MBS Swallow: 1 Procedure SLP visit diagnosis: SLP Visit Diagnosis: Dysphagia, pharyngeal phase (R13.13) Past Medical History: Past Medical History: Diagnosis Date  Cellulitis 07/17/2020  LEFT HIP  Diabetes mellitus   Dyspnea   Hyperlipidemia   Hypertension  Past Surgical History: Past Surgical History: Procedure Laterality Date  BIOPSY  05/01/2023  Procedure: BIOPSY;  Surgeon: Beverley Fiedler, MD;  Location: [facility] ENDOSCOPY;  Service: Gastroenterology;;  ESOPHAGOGASTRODUODENOSCOPY (EGD) WITH PROPOFOL N/A 05/01/2023  Procedure: ESOPHAGOGASTRODUODENOSCOPY (EGD) WITH PROPOFOL;  Surgeon: Beverley Fiedler, MD;  Location[facility] ENDOSCOPY;  Service: Gastroenterology;  Laterality: N/A;  FEMUR IM NAIL Left 03/27/2023  Procedure: INTRAMEDULLARY (IM) RETROGRADE FEMORAL NAILING;  Surgeon: Myrene Galas, MD;  Location: [facility] OR;  Service: Orthopedics;  Laterality: Left;  SPINE SURGERY   Angela Nevin, MA, CCC-SLP Speech Therapy   DG CHEST PORT 1 VIEW  Result Date: 08/11/2023 CLINICAL DATA:  Pneumothorax follow-up EXAM: PORTABLE CHEST 1 VIEW COMPARISON:  Prior chest x-ray obtained earlier today at 2:52 a.m. FINDINGS: Slight interval progression of right apical pneumothorax with 2.8 cm displacement compared to 1.6 cm earlier  this morning. Increasing pulmonary vascular congestion and interstitial prominence suggest developing pulmonary edema. Cardiomegaly. Increasing bibasilar airspace opacities likely reflect atelectasis. IMPRESSION: 1. Slight interval increase in right apical pneumothorax compared to earlier this morning. 2. Developing pulmonary edema and bibasilar  atelectasis. Electronically Signed   By: Malachy Moan M.D.   On: 08/11/2023 09:28   DG Chest Portable 1 View  Result Date: 08/11/2023 CLINICAL DATA:  Follow-up pneumothorax. Shortness of breath. Dr. Attempted to aspirate pneumothorax. EXAM: PORTABLE CHEST 1 VIEW COMPARISON:  Radiograph 08/11/2023 at 1:11 a.m. FINDINGS: Slightly decreased size of the small to moderate right apical and lateral pneumothorax. Stable enlargement of the cardiomediastinal silhouette. Persistent infiltrates in the right upper and lower lung. No pleural effusion. Chronic interstitial coarsening. No displaced rib fractures. IMPRESSION: Slight decrease in size of the right small-moderate pneumothorax. Electronically Signed   By: Minerva Fester M.D.   On: 08/11/2023 03:19   DG Chest Port 1 View  Addendum Date: 08/11/2023   ADDENDUM REPORT: 08/11/2023 01:45 ADDENDUM: These results were called by telephone at the time of interpretation on 08/11/2023 at 1:45 am to provider Glynn Octave , who verbally acknowledged these results. l Electronically Signed   By: Helyn Numbers M.D.   On: 08/11/2023 01:45   Result Date: 08/11/2023 CLINICAL DATA:  Dyspnea, sepsis EXAM: PORTABLE CHEST 1 VIEW COMPARISON:  07/24/2023 FINDINGS: The lungs are hyperinflated in keeping with changes of underlying COPD. Moderate right pneumothorax has developed. No radiographic evidence of tension. Extensive interstitial infiltrate within the right upper lobe appears improved in the interval since prior examination. Patchy infiltrate within the right mid lung zone right lung base persist. No pleural effusion. No  pneumothorax on the left. Cardiac size is within normal limits. IMPRESSION: 1. Moderate right pneumothorax. No radiographic evidence of tension physiology. 2. COPD. 3. Improving right upper lobe infiltrate. Persistent patchy pneumonic infiltrate within the right mid and lower lung zone. Electronically Signed: By: Helyn Numbers M.D. On: 08/11/2023 01:37    Pending Labs Unresulted Labs (From admission, onward)     Start     Ordered   08/12/23 0500  CBC  Tomorrow morning,   R        08/11/23 1149   08/12/23 0500  Comprehensive metabolic panel  Tomorrow morning,   R        08/11/23 1149   08/11/23 0050  Blood Culture (routine x 2)  (Septic presentation on arrival (screening labs, nursing and treatment orders for obvious sepsis))  BLOOD CULTURE X 2,   STAT      08/11/23 0050            Vitals/Pain Today's Vitals   08/11/23 1458 08/11/23 1458 08/11/23 1500 08/11/23 1624  BP:   102/69   Pulse:      Resp:   20   Temp:    97.8 F (36.6 C)  TempSrc:      SpO2:      Weight:      Height:      PainSc: 6  5       Isolation Precautions Airborne and Contact precautions  Medications Medications  enoxaparin (LOVENOX) injection 40 mg (40 mg Subcutaneous Given 08/11/23 0957)  acetaminophen (TYLENOL) tablet 650 mg (has no administration in time range)    Or  acetaminophen (TYLENOL) suppository 650 mg (has no administration in time range)  ipratropium-albuterol (DUONEB) 0.5-2.5 (3) MG/3ML nebulizer solution 3 mL (3 mLs Nebulization Given 08/11/23 1401)  busPIRone (BUSPAR) tablet 5 mg (5 mg Oral Given 08/11/23 1736)  collagenase (SANTYL) ointment ( Topical Canceled Entry 08/11/23 0959)  insulin glargine-yfgn (SEMGLEE) injection 5 Units (0 Units Subcutaneous Hold 08/11/23 0932)  Oral care mouth rinse (15 mLs Mouth Rinse Given 08/11/23 0933)  insulin  aspart (novoLOG) injection 0-15 Units (0 Units Subcutaneous Hold 08/11/23 1744)  insulin aspart (novoLOG) injection 0-5 Units (has no administration  in time range)  remdesivir 200 mg in sodium chloride 0.9% 250 mL IVPB (0 mg Intravenous Stopped 08/11/23 0835)    Followed by  remdesivir 100 mg in sodium chloride 0.9 % 100 mL IVPB (has no administration in time range)  piperacillin-tazobactam (ZOSYN) IVPB 3.375 g (0 g Intravenous Stopped 08/11/23 1504)  dextrose 10 % infusion ( Intravenous New Bag/Given 08/11/23 0904)  guaiFENesin (ROBITUSSIN) 100 MG/5ML liquid 5 mL (5 mLs Oral Given 08/11/23 1736)  HYDROmorphone (DILAUDID) injection 1 mg (has no administration in time range)  lactated ringers bolus 1,000 mL (0 mLs Intravenous Stopped 08/11/23 0444)  ceFEPIme (MAXIPIME) 2 g in sodium chloride 0.9 % 100 mL IVPB (0 g Intravenous Stopped 08/11/23 0444)  vancomycin (VANCOREADY) IVPB 2000 mg/400 mL (0 mg Intravenous Stopped 08/11/23 0724)  lactated ringers bolus 1,000 mL (0 mLs Intravenous Stopped 08/11/23 1610)    Mobility non-ambulatory     Focused Assessments    R Recommendations: See Admitting Provider Note  Report given to:   Additional Notes:  covid+ 4l [facility]

## 2023-08-11 NOTE — Evaluation (Signed)
Physical Therapy One time Evaluation Patient Details Name: [name] MRN: [id number] DOB: 25-Apr-1956 Today's Date: 08/11/2023  History of Present Illness  [name] is a 67 y.o.  who presented with shortness of breath and admitted for Covid, aspiration pneumonia, pneumothorax. Large sacral wound and more than one wound on buttocks. PMH of T2DM, HTN, Afib, paraplegia, severe deconditioning/malnutrition  Clinical Impression  Pt admitted with above diagnosis. Per chart and pt, he has been bedbound since April since he broke his hip.  He has chronic wounds and cannot assist in mobility much at all.  Pt is not appropriate for PT at this time and will not follow pt.  Nursing can use mechanical lift if they determine to get pt OOB.  Will sign off.     If plan is discharge home, recommend the following: Assist for transportation;Help with stairs or ramp for entrance;Two people to help with walking and/or transfers;A lot of help with walking and/or transfers;Two people to help with bathing/dressing/bathroom;Assistance with cooking/housework   Can travel by private vehicle   No    Equipment Recommendations None recommended by PT  Recommendations for Other Services       Functional Status Assessment Patient has not had a recent decline in their functional status     Precautions / Restrictions Precautions Precautions: Fall Precaution Comments: fell in 03/2023 with L femur fx (missed w/c during transfer), 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  PT asssisted NT with cleaning pt as he had dirty linens under him and was soiled. Had to call nurse in to redress  wounds as well.    Transfers                        Ambulation/Gait                  Stairs            Wheelchair Mobility     Tilt Bed    Modified Rankin (Stroke Patients Only)       Balance                                             Pertinent Vitals/Pain Pain Assessment Pain Assessment: Faces Faces Pain Scale: Hurts little more Pain Location: legs Pain Descriptors / Indicators: Discomfort Pain Intervention(s): Limited activity within patient's tolerance, Monitored during session, Repositioned    Home Living Family/patient expects to be discharged to:: Skilled nursing facility                        Prior Function Prior Level of Function : Needs assist             Mobility Comments: non-ambulatory and w/c bound at baseline. Prior to femur fx, pt reports that he was able to complete scoot/squat pivot transfers to w/c/BSC without physical assist. He fell and missed the wheelchair 03/2023 sustaining a left femur fracture and has not been able to tolerate getting out of bed d/t  severe pain.  Bedbound since pt pt ADLs Comments: Reports that prior to his falling and sustaining the left femur fx, pt was able to complete all bathing, dressing and functional transfers himself. Since April, he has only been spongebathing. He has required assist for all BADL tasks. Wears adult briefs for incontinence.     Extremity/Trunk Assessment   Upper Extremity Assessment RUE Deficits / Details: A/ROM WFL. Limited er. MMT: shoulder flexion/abduction/IR/er: 4/5, elbow flexion: 5/5, elbow extension: 4/5. weak gross grasp although functional as he was able to hold onto bed rails to assist with mobility. RUE Coordination: decreased fine motor LUE Deficits / Details: A/ROM WFL. Limited er. MMT: shoulder flexion/abduction/IR/er: 4/5, elbow flexion: 5/5, elbow extension: 4/5. weak gross grasp although functional as he was able to hold onto bed  rails to assist with mobility. LUE Coordination: decreased fine motor    Lower Extremity Assessment RLE Deficits / Details: Able to extend at hip/knee to -20 extension (20 flexion); +spasms refuses for PT to move LE very much LLE Deficits / Details: Able to extend at hip/knee to -20 extension (20 flexion); +spasms refuses for PT to move LE very much       Communication   Communication Communication: No apparent difficulties  Cognition   Behavior During Therapy: [facility] for tasks assessed/performed Overall Cognitive Status: Within Functional Limits for tasks assessed                                 General Comments: Generally uncomfortable and cursing at PT when PT asked him to move his UEs        General Comments General comments (skin integrity, edema, etc.): VSS    Exercises     Assessment/Plan    PT Assessment Patient does not need any further PT services  PT Problem List Decreased strength;Decreased range of motion;Decreased activity tolerance;Decreased mobility;Impaired sensation;Impaired tone;Pain;Decreased skin integrity       PT Treatment Interventions Functional mobility training;Therapeutic activities;Therapeutic exercise;Patient/family education    PT Goals (Current goals can be found in the Care Plan section)  Acute Rehab PT Goals Patient Stated Goal: to return to SNF PT Goal Formulation: All assessment and education complete, DC therapy    Frequency       Co-evaluation               AM-PAC PT "6 Clicks" Mobility  Outcome Measure Help needed turning from your back to your side while in a flat bed without using bedrails?: Total Help needed moving from lying on your back to sitting on the side of a flat bed without using bedrails?: Total Help needed moving to and from a bed to a chair (including a wheelchair)?: Total Help needed standing up from a chair using your arms (e.g., wheelchair or bedside chair)?: Total Help needed to walk in  hospital room?: Total Help needed climbing 3-5 steps with a railing? : Total 6 Click Score: 6    End of Session   Activity Tolerance: Patient limited by pain Patient left: with call bell/phone within reach (on stretcher) Nurse Communication: Mobility status;Need for lift equipment PT Visit Diagnosis: Muscle weakness (generalized) (M62.81);History of falling (Z91.81);Difficulty in walking, not elsewhere classified (R26.2)    Time: 2841-3244 PT Time Calculation (min) (ACUTE ONLY): 28 min   Charges:   PT Evaluation $PT Eval Low Complexity: 1 Low PT Treatments $Therapeutic Activity: 8-22 mins PT General Charges $$ ACUTE PT VISIT: 1 Visit  [facility] M,PT Acute Rehab Services [phone]   Bevelyn Buckles 08/11/2023, 2:17 PM

## 2023-08-11 NOTE — H&P (Addendum)
Date: 08/11/2023               Patient Name:  [name] MRN: [id number]  DOB: 11-09-1956 Age / Sex: 67 y.o., male   PCP: Pcp, No         Medical Service: Internal Medicine Teaching Service         Attending Physician: Dr. Ninetta Lights, Lacretia Leigh, MD       *Please do not leave messages for physician in this sticky note. Please page appropriate physician as below.*   Weekday Hours (7AM-5PM):   First Contact: Monna Fam, MD        Pager: BP [phone]        Second Contact: Olegario Messier, MD    Pager: [phone]    ** If no return call within 15 minutes (after trying both pagers listed above), please call after hours pagers.   After 5 pm or weekends:  1st Contact: Pager: [phone]  2nd Contact: Pager: [phone]   Chief Complaint: Shortness of breath  History of Present Illness:  Pt is a 67 year old chronically ill male with Pmhx of HTN, DMII, paraplegia of the lower extremities, protein calorie malnutrition c/b by sarcopenia, Afib, aspiration pneumonia who presented with worsening shortness of breath. Pt with multiple ED visits and multiple hospital admissions including 2 recent ones on 06/28 for hip osteomyelitis and required re-admission on 06/29/23 for osteomyelitis for continued inpatient antibiotic therapy. His hospital course was complicated by aspiration pneumonia. Pt was discharged after one month hospitalization.   He presents today with worsening shortness of breath. He endorsed coughing and shortness of breath. He was noted to be COVID positive at the SNF. Denies other concerns except cough with sputum production. States has nausea but it is secondary to the food at the SNF. States his symptoms have been present for 2-3 days.  In the ED, CXR done showed pt with a pneumothorax and a right middle/lower lobe infiltrate. PCCM was consulted and performed needle decompression. They recommended holding off on chest tube placement and following interval CXR to  ensure resolution. IMTS was consulted for admission.   Review of Systems negative unless stated in the HPI.  Past Medical History: Past Medical History:  Diagnosis Date   Cellulitis 07/17/2020   LEFT HIP   Diabetes mellitus    Dyspnea    Hyperlipidemia    Hypertension     Meds: Current Outpatient Medications  Medication Instructions   acetaminophen (TYLENOL) 650 mg, Oral, Every 6 hours   busPIRone (BUSPAR) 5 mg, Oral, 3 times daily   collagenase (SANTYL) 250 UNIT/GM ointment Topical, Daily   feeding supplement (ENSURE ENLIVE / ENSURE PLUS) LIQD 237 mLs, Oral, 3 times daily between meals   gabapentin (NEURONTIN) 800 mg, Oral, 3 times daily   HYDROmorphone (DILAUDID) 2 mg, Oral, Every 6 hours   hydrOXYzine (ATARAX) 25 mg, Oral, 3 times daily PRN   insulin aspart (NOVOLOG) 0-15 Units, Subcutaneous, 3 times daily with meals   insulin aspart (NOVOLOG) 0-5 Units, Subcutaneous, Daily at bedtime   insulin glargine-yfgn (SEMGLEE) 5 Units, Subcutaneous, Daily   ipratropium (ATROVENT) 0.5 mg, Nebulization, Every 4 hours PRN   lidocaine (LIDODERM) 5 % 1 patch, Transdermal, Every 24 hours, Remove & Discard patch within 12 hours or as directed by MD   LORazepam (ATIVAN) 0.5 mg, Oral, Every 8 hours PRN   losartan (COZAAR) 25 mg, Oral, Daily   mirtazapine (REMERON SOL-TAB) 15 mg, Oral, Daily at bedtime  Mouthwashes (MOUTH RINSE) LIQD solution 15 mLs, Mouth Rinse, Daily   Multiple Vitamin (MULTIVITAMIN WITH MINERALS) TABS tablet 1 tablet, Oral, Daily   oxyCODONE (OXY IR/ROXICODONE) 5 mg, Oral, 2 times daily   pantoprazole (PROTONIX) 40 mg, Oral, 2 times daily before meals   senna-docusate (SENOKOT-S) 8.6-50 MG tablet 1 tablet, Oral, Daily at bedtime   thiamine (VITAMIN B-1) 100 mg, Oral, Daily   Allergies: Allergies as of 08/11/2023 - Review Complete 08/11/2023  Allergen Reaction Noted   Lipitor [atorvastatin calcium] Other (See Comments) 01/28/2012   Past Surgical History: Past  Surgical History:  Procedure Laterality Date   BIOPSY  05/01/2023   Procedure: BIOPSY;  Surgeon: Beverley Fiedler, MD;  Location: [facility] ENDOSCOPY;  Service: Gastroenterology;;   ESOPHAGOGASTRODUODENOSCOPY (EGD) WITH PROPOFOL N/A 05/01/2023   Procedure: ESOPHAGOGASTRODUODENOSCOPY (EGD) WITH PROPOFOL;  Surgeon: Beverley Fiedler, MD;  Location: [facility] ENDOSCOPY;  Service: Gastroenterology;  Laterality: N/A;   FEMUR IM NAIL Left 03/27/2023   Procedure: INTRAMEDULLARY (IM) RETROGRADE FEMORAL NAILING;  Surgeon: Myrene Galas, MD;  Location: [facility] OR;  Service: Orthopedics;  Laterality: Left;   SPINE SURGERY      Social History:  Coming with [facility] Tobacco- 3 cigarettes per day EtOH- not currently using alcohol.  Illicit drug use- denies use.  IADLs/ADLs- can person independently at baseline   Physical Exam: Blood pressure 131/76, pulse (!) 106, temperature 100.2 F (37.9 C), temperature source Rectal, resp. rate 11, height 5\' 10"  (1.778 m), weight 86 kg, SpO2 100%. General: chronically ill appearing male in NAD HENT: [place] in place, MMM Lungs: Decreased breath sounds in right lung Cardiovascular: sinus tachyardia, good radial pulse Abdomen:No TTP, normal bowel sounds MSK: decreased muscle tone Skin: sacral ulcer present (POA), skin breakdown on upper back Neuro: alert and oriented x4  Psych: normal mood  Diagnostics:     Latest Ref Rng & Units 08/11/2023    3:14 AM 08/11/2023    3:13 AM 08/11/2023    2:58 AM  CBC  WBC 4.0 - 10.5 K/uL   17.4   Hemoglobin 13.0 - 17.0 g/dL 9.9  9.2  8.4   Hematocrit 39.0 - 52.0 % 29.0  27.0  28.0   Platelets 150 - 400 K/uL   461        Latest Ref Rng & Units 08/11/2023    3:14 AM 08/11/2023    3:13 AM 08/11/2023    2:58 AM  CMP  Glucose 70 - 99 mg/dL 94   97   BUN 8 - 23 mg/dL 39   38   Creatinine 1.61 - 1.24 mg/dL 0.96   0.45   Sodium 409 - 145 mmol/L 139  139  139   Potassium 3.5 - 5.1 mmol/L 5.0  4.9  4.8   Chloride 98 - 111 mmol/L 104   102   CO2 22 -  32 mmol/L   24   Calcium 8.9 - 10.3 mg/dL   8.4   Total Protein 6.5 - 8.1 g/dL   6.2   Total Bilirubin 0.3 - 1.2 mg/dL   0.6   Alkaline Phos 38 - 126 U/L   64   AST 15 - 41 U/L   35   ALT 0 - 44 U/L   29     DG Chest Portable 1 View  Result Date: 08/11/2023 CLINICAL DATA:  Follow-up pneumothorax. Shortness of breath. Dr. Attempted to aspirate pneumothorax. EXAM: PORTABLE CHEST 1 VIEW COMPARISON:  Radiograph 08/11/2023 at 1:11 a.m.  IMPRESSION: Slight decrease in  size of the right small-moderate pneumothorax. Electronically Signed   By: Minerva Fester M.D.   On: 08/11/2023 03:19   DG Chest Port 1 View  Addendum Date: 08/11/2023   ADDENDUM REPORT: 08/11/2023 01:45 ADDENDUM: These results were called by telephone at the time of interpretation on 08/11/2023 at 1:45 am to provider Glynn Octave , who verbally acknowledged these results. l Electronically Signed   By: Helyn Numbers M.D.   On: 08/11/2023 01:45   Result Date: 08/11/2023 CLINICAL DATA:  Dyspnea, sepsis EXAM: PORTABLE CHEST 1 VIEW COMPARISON:  07/24/2023  IMPRESSION: 1. Moderate right pneumothorax. No radiographic evidence of tension physiology. 2. COPD. 3. Improving right upper lobe infiltrate. Persistent patchy pneumonic infiltrate within the right mid and lower lung zone. Electronically Signed: By: Helyn Numbers M.D. On: 08/11/2023 01:37     EKG: personally reviewed my interpretation is sinus tachycardia. Poor study due to artifact  CXR: personally reviewed my interpretation is infiltrate in the middle/right lower lobe. Significant pneumothorax in the right upper lobe. Repeat imaging shows interval decrease in pneumothorax status post thoracentesis decompression. The decrease is mild.    Assessment & Plan by Problem:  Present on Admission:  Acute hypoxic respiratory failure (HCC)   Sepsis secondary to aspiration pneumonia Acute hypoxic respiratory failure Patient with acute on subacute hypoxic respiratory failure.   Patient does not have supplemental oxygen use at baseline but in his last admission he developed aspiration pneumonia and was discharged with 2 L supplemental oxygen. This has increased to 4 L currently.  His acute hypoxic respiratory failure is multifactorial in the setting of COVID infection, pneumothorax, and aspiration pneumonia.  Suspect patient is aspirating frequently and SLP recommended core track placement.  This was initially held as patient pursue comfort measures but rescinded comfort measures and core track was placed given high aspiration risk.  This was removed prior to discharge as patient was tolerating oral intake.  Suspect patient's pneumothorax is secondary to coughing from COVID and aspiration pneumonia.  He has shown some improvement with needle decompression and plan is for serial monitoring of his chest x-rays to ensure resolution.  Patient CBC shows leukocytosis with neutrophil predominance and patient is report of chills and subjective fevers  so will treat patient's aspiration pneumonia and COVID infection.  Blood cultures obtained.  Cefepime and vancomycin started.  Pt s/p LR bolus and now on infusion per sepsis protocol. Overall prognosis is poor (see below)  Goals of Care: Patient with multiple ED visits and hospitalizations in 2024 and overall very poor quality of life.  He did transition to comfort care but rescinded the comfort care orders.  Given continued aspiration and overall poor status, we discussed this with the patient again. But if pt continues full measures, he may need PEG tube and chronic NPO status to minimize aspiration. Palliative care consult placed.   Osteomyelitis: S/p 6 weeks of IV antibiotics.   Decubitus Ulcer Pt is bed bound and has poor nutritional intake. Ulcer does not look infected currently. Will place wound care consult for this. Sacral foam and q2hr turns ordered. For his pain, I ordered IV dilaudid 0.5 mg q6hrs. Holding his oral meds until pt is  seen by SLP.   Failure to thrive: Sarcopenia: Chronic problem for him. Will keep NPO for now and consult SLP.    T2DM:  Chronic. SSI here   Hypertension  Chronic. Hold home medication. Can use IV prn meds if hypertensive.    Anxiety Chronic. Home meds buspar, hydroxyzine  held for now.   Normocytic anemia Chroinc. CBC at basline around 8.4. Tsat 16 %. Will recommend oral iron at discharge.   DVT prophx: Lovenox Diet: NPO Bowel: PRN Code: DNR with limited interventions  Prior to Admission Living Arrangement: SNF Anticipated Discharge Location: TBD Barriers to Discharge: Medical Workup  Dispo: Admit patient to Inpatient with expected length of stay greater than 2 midnights.  Gwenevere Abbot, MD [name]. [facility] Internal Medicine Residency, PGY-3 Pager: [phone]

## 2023-08-11 NOTE — ED Notes (Addendum)
Provider at bedside attempting to aspirate pneumothorax.  This RN will attempt to obtain ultrasound IV once procedure complete.

## 2023-08-12 ENCOUNTER — Inpatient Hospital Stay (HOSPITAL_COMMUNITY): Payer: 59

## 2023-08-12 ENCOUNTER — Encounter: Payer: 59 | Admitting: Student

## 2023-08-12 DIAGNOSIS — Z7189 Other specified counseling: Secondary | ICD-10-CM | POA: Diagnosis not present

## 2023-08-12 DIAGNOSIS — L8990 Pressure ulcer of unspecified site, unspecified stage: Secondary | ICD-10-CM | POA: Insufficient documentation

## 2023-08-12 DIAGNOSIS — J690 Pneumonitis due to inhalation of food and vomit: Secondary | ICD-10-CM | POA: Diagnosis not present

## 2023-08-12 DIAGNOSIS — J9601 Acute respiratory failure with hypoxia: Secondary | ICD-10-CM | POA: Diagnosis not present

## 2023-08-12 DIAGNOSIS — I10 Essential (primary) hypertension: Secondary | ICD-10-CM | POA: Diagnosis not present

## 2023-08-12 DIAGNOSIS — Z515 Encounter for palliative care: Secondary | ICD-10-CM | POA: Diagnosis not present

## 2023-08-12 DIAGNOSIS — U071 COVID-19: Secondary | ICD-10-CM | POA: Diagnosis not present

## 2023-08-12 DIAGNOSIS — J9311 Primary spontaneous pneumothorax: Secondary | ICD-10-CM | POA: Diagnosis not present

## 2023-08-12 DIAGNOSIS — R451 Restlessness and agitation: Secondary | ICD-10-CM

## 2023-08-12 DIAGNOSIS — Z794 Long term (current) use of insulin: Secondary | ICD-10-CM | POA: Diagnosis not present

## 2023-08-12 LAB — CBC
HCT: 25.7 % — ABNORMAL LOW (ref 39.0–52.0)
Hemoglobin: 7.8 g/dL — ABNORMAL LOW (ref 13.0–17.0)
MCH: 27.1 pg (ref 26.0–34.0)
MCHC: 30.4 g/dL (ref 30.0–36.0)
MCV: 89.2 fL (ref 80.0–100.0)
Platelets: 418 10*3/uL — ABNORMAL HIGH (ref 150–400)
RBC: 2.88 MIL/uL — ABNORMAL LOW (ref 4.22–5.81)
RDW: 17.5 % — ABNORMAL HIGH (ref 11.5–15.5)
WBC: 11.7 10*3/uL — ABNORMAL HIGH (ref 4.0–10.5)
nRBC: 0 % (ref 0.0–0.2)

## 2023-08-12 LAB — COMPREHENSIVE METABOLIC PANEL
ALT: 23 U/L (ref 0–44)
AST: 28 U/L (ref 15–41)
Albumin: 1.6 g/dL — ABNORMAL LOW (ref 3.5–5.0)
Alkaline Phosphatase: 55 U/L (ref 38–126)
Anion gap: 11 (ref 5–15)
BUN: 23 mg/dL (ref 8–23)
CO2: 26 mmol/L (ref 22–32)
Calcium: 8.1 mg/dL — ABNORMAL LOW (ref 8.9–10.3)
Chloride: 103 mmol/L (ref 98–111)
Creatinine, Ser: 1.01 mg/dL (ref 0.61–1.24)
GFR, Estimated: >60 mL/min (ref >60)
Glucose, Bld: 92 mg/dL (ref 70–99)
Potassium: 3.8 mmol/L (ref 3.5–5.1)
Sodium: 140 mmol/L (ref 135–145)
Total Bilirubin: 0.8 mg/dL (ref 0.3–1.2)
Total Protein: 5.9 g/dL — ABNORMAL LOW (ref 6.5–8.1)

## 2023-08-12 LAB — GLUCOSE, CAPILLARY
Glucose-Capillary: 70 mg/dL (ref 70–99)
Glucose-Capillary: 90 mg/dL (ref 70–99)
Glucose-Capillary: 99 mg/dL (ref 70–99)

## 2023-08-12 MED ORDER — HYDROMORPHONE HCL 1 MG/ML IJ SOLN: 0.5000 mg | INTRAMUSCULAR | Status: DC | PRN

## 2023-08-12 MED ORDER — POLYVINYL ALCOHOL 1.4 % OP SOLN: 1.0000 [drp] | Freq: Four times a day (QID) | OPHTHALMIC | Status: DC | PRN

## 2023-08-12 MED ORDER — GLYCOPYRROLATE 1 MG PO TABS: 1.0000 mg | ORAL_TABLET | ORAL | Status: DC | PRN

## 2023-08-12 MED ORDER — LORAZEPAM 2 MG/ML IJ SOLN
0.5000 mg | INTRAMUSCULAR | Status: DC
  Administered 2023-08-12 (×3): 0.5 mg via INTRAVENOUS
  Filled 2023-08-12 (×3): qty 1

## 2023-08-12 MED ORDER — HYDROMORPHONE HCL 1 MG/ML IJ SOLN: 1.0000 mg | INTRAMUSCULAR | Status: AC | PRN

## 2023-08-12 MED ORDER — POLYVINYL ALCOHOL 1.4 % OP SOLN: 1.0000 [drp] | Freq: Four times a day (QID) | OPHTHALMIC | Status: AC | PRN

## 2023-08-12 MED ORDER — HYDROMORPHONE HCL 1 MG/ML IJ SOLN: 0.5000 mg | INTRAMUSCULAR | Status: AC | PRN

## 2023-08-12 MED ORDER — GLYCOPYRROLATE 0.2 MG/ML IJ SOLN: 0.2000 mg | INTRAMUSCULAR | Status: AC | PRN

## 2023-08-12 MED ORDER — ACETAMINOPHEN 325 MG PO TABS: 650.0000 mg | ORAL_TABLET | Freq: Four times a day (QID) | ORAL | Status: AC | PRN

## 2023-08-12 MED ORDER — GLYCOPYRROLATE 0.2 MG/ML IJ SOLN: 0.2000 mg | INTRAMUSCULAR | Status: DC | PRN

## 2023-08-12 MED ORDER — LORAZEPAM 2 MG/ML IJ SOLN: 0.5000 mg | INTRAMUSCULAR | Status: DC | PRN

## 2023-08-12 MED ORDER — LORAZEPAM 2 MG/ML IJ SOLN: 0.5000 mg | INTRAMUSCULAR | Status: AC | PRN

## 2023-08-12 MED ORDER — ONDANSETRON HCL 4 MG/2ML IJ SOLN: 4.0000 mg | Freq: Four times a day (QID) | INTRAMUSCULAR | Status: DC | PRN

## 2023-08-12 MED ORDER — ONDANSETRON HCL 4 MG/2ML IJ SOLN: 4.0000 mg | Freq: Four times a day (QID) | INTRAMUSCULAR | Status: AC | PRN

## 2023-08-12 MED ORDER — BIOTENE DRY MOUTH MT LIQD: 15.0000 mL | OROMUCOSAL | Status: DC | PRN

## 2023-08-12 MED ORDER — HYDROMORPHONE HCL 1 MG/ML IJ SOLN: 1.0000 mg | INTRAMUSCULAR | Status: AC

## 2023-08-12 MED ORDER — ONDANSETRON 4 MG PO TBDP: 4.0000 mg | ORAL_TABLET | Freq: Four times a day (QID) | ORAL | Status: DC | PRN

## 2023-08-12 MED ORDER — DEXTROSE 50 % IV SOLN
1.0000 | Freq: Once | INTRAVENOUS | Status: DC
  Filled 2023-08-12: qty 50

## 2023-08-12 MED ORDER — COLLAGENASE 250 UNIT/GM EX OINT: TOPICAL_OINTMENT | Freq: Every day | CUTANEOUS | Status: AC

## 2023-08-12 MED ORDER — GUAIFENESIN 100 MG/5ML PO LIQD: 5.0000 mL | ORAL | Status: AC | PRN

## 2023-08-12 MED ORDER — ONDANSETRON 4 MG PO TBDP: 4.0000 mg | ORAL_TABLET | Freq: Four times a day (QID) | ORAL | Status: AC | PRN

## 2023-08-12 MED ORDER — LORAZEPAM 2 MG/ML IJ SOLN: 0.5000 mg | INTRAMUSCULAR | Status: AC

## 2023-08-12 MED ORDER — BIOTENE DRY MOUTH MT LIQD: 15.0000 mL | OROMUCOSAL | Status: AC | PRN

## 2023-08-12 MED ORDER — HYDROMORPHONE HCL 1 MG/ML IJ SOLN
1.0000 mg | INTRAMUSCULAR | Status: DC
  Administered 2023-08-12 (×3): 1 mg via INTRAVENOUS
  Filled 2023-08-12 (×3): qty 1

## 2023-08-12 NOTE — Progress Notes (Signed)
Palliative Medicine Inpatient Follow Up Note HPI: [name] is a 67 y.o. with a pertinent PMH of T2DM, HTN, Afib, paraplegia, severe deconditioning/malnutrition who presented with shortness of breath and admitted for Covid, aspiration pneumonia, pneumothorax. The Palliative care team has been asked to get involved for additional goals of care conversations.   Today's Discussion 08/12/2023  *Please note that this is a verbal dictation therefore any spelling or grammatical errors are due to the "Dragon Medical One" system interpretation.  Chart reviewed inclusive of vital signs, progress notes, laboratory results, and diagnostic images.   Per note review and nursing endorsement, Deland has been disoriented throughout the morning. Has made threatening comments.  Upon assessment, Luisenrique does not appear aware of place or situation. He shares that he has chest pain asking for some relief from this. Further inquiry as to how far we should go from the perspective of medical workup was sought with this patient though he was unable to provide meaningful participation.   Informed patients brother, [name] of his present state. Booby and I discussed the option of continues aggressive medical care or alternatively allowing for comfort. [name] had already spoken to the primary medical service and determined that he has seen Treylen "suffer" enough and would like to allow him to be comfortable.  We talked about transition to comfort measures in house and what that would entail inclusive of medications to control pain, dyspnea, agitation, nausea, itching, and hiccups.   We discussed stopping all uneccessary measures such as cardiac monitoring, blood draws, needle sticks, and frequent vital signs.   Created space and opportunity for [name] to explore thoughts feelings and fears regarding Zenk current medical situation, he is aware that his brother has limited time on earth. He understands ongoing medical  efforts will possibly cause greater harm and burden to the patient. He has elected to pursue full comfort emphasis of care with transition to an inpatient hospice unit - preference [facility]   Questions and concerns addressed/Palliative Support Provided.   Objective Assessment: Vital Signs Vitals:   08/12/23 0449 08/12/23 0809  BP: 117/60 128/64  Pulse: 85 89  Resp: (!) 23 19  Temp: (!) 97.5 F (36.4 C)   SpO2: 100% 100%    Intake/Output Summary (Last 24 hours) at 08/12/2023 1141 Last data filed at 08/12/2023 0608 Gross per 24 hour  Intake 50 ml  Output 400 ml  Net -350 ml   Last Weight  Most recent update: 08/12/2023  4:54 AM    Weight  75.3 kg (166 lb 0.1 oz)            Gen:  Frail elderly AA M in NAD HEENT: Dry mucous membranes CV: regular rate and irregular rhythm  PULM: On 5LPM , tachypnea ABD: soft/nontender  EXT: Muscle wasting Neuro: Disoriented  SUMMARY OF RECOMMENDATIONS   DNAR/DNI  Comfort focused care  Have ordered dilaudid and ativan ATC for dyspnea and agitation  Additional comfort medications per MAR  Appreciate TOC reaching out to [facility] for IP Hospice  Ongoing PMT support  Billing based on MDM: High _____________________________________________________________________________________ Lamarr Lulas Brady Palliative Medicine Team Team Cell Phone: [phone] Please utilize secure chat with additional questions, if there is no response within 30 minutes please call the above phone number  Palliative Medicine Team providers are available by phone from 7am to 7pm daily and can be reached through the team cell phone.  Should this patient require assistance outside of these hours, please call the  patient's attending physician.

## 2023-08-12 NOTE — Evaluation (Signed)
Occupational Therapy Evaluation and Discharge Patient Details Name: [name] MRN: [id number] DOB: 01/14/1956 Today's Date: 08/12/2023   History of Present Illness [name] is a 67 y.o.  who presented with shortness of breath and admitted for Covid, aspiration pneumonia, pneumothorax. Large sacral wound and more than one wound on buttocks. PMH of T2DM, HTN, Afib, paraplegia, severe deconditioning/malnutrition   Clinical Impression   Per chart review, pt presents from SNF and has been bedbound since April 2024 and has required assistance with all ADLs and IADLs. Pt presents with confusion, agitation, and anxiety this day with pt unable to provide history beyond previously living with brother, unable to follow 1-step commands, and disoriented to place, situation, and time. Pt currently requiring Total assist of +1 to +2 for ADLs and for functional transfers with a mechanical lift. Due to current cognitive deficits, pt is not appropriate for acute skilled OT services at this time. Post acute discharge, OT recommends return to SNF with with no OT follow up indicated at this time. If pt cognitive status changes, acute OT is happy to reassess pt. OT signing off at this time.       If plan is discharge home, recommend the following: Two people to help with walking and/or transfers;Two people to help with bathing/dressing/bathroom;Assistance with cooking/housework;Assistance with feeding;Direct supervision/assist for medications management;Direct supervision/assist for financial management;Assist for transportation;Help with stairs or ramp for entrance;Supervision due to cognitive status    Functional Status Assessment  Patient has had a recent decline in their functional status and/or demonstrates limited ability to make significant improvements in function in a reasonable and predictable amount of time  Equipment Recommendations  Other (comment) (Defer to next level of care)     Recommendations for Other Services       Precautions / Restrictions Precautions Precautions: Fall Restrictions Weight Bearing Restrictions: No      Mobility Bed Mobility Overal bed mobility: Needs Assistance Bed Mobility: Rolling Rolling: Total assist, +2 for physical assistance, +2 for safety/equipment              Transfers Overall transfer level: Needs assistance                 General transfer comment: Total assist for transfers since April;  Must also consider having an appropriate seating surface for pressure redistribution as we work towards pt spending more time OOB      Balance       Sitting balance - Comments: Transferring to sit EOB deferred this session secondary to pt unable to follow 1-step commands this session and pt presenting with agitaiton and anxiety.                                   ADL either performed or assessed with clinical judgement   ADL Overall ADL's : Needs assistance/impaired                                       General ADL Comments: Pt currently requiring Total assist +1 to +2 for all ADLs with pt unable to follow 1-step commands to assist with funcitonal tasks at this time.     Vision Patient Visual Report: Other (comment) (Pt unable to report)       Perception         Praxis  Pertinent Vitals/Pain Pain Assessment Pain Assessment: Faces Faces Pain Scale: Hurts a little bit Pain Location: B feet Pain Descriptors / Indicators: Discomfort, Grimacing Pain Intervention(s): Monitored during session, Repositioned, Limited activity within patient's tolerance     Extremity/Trunk Assessment Upper Extremity Assessment Upper Extremity Assessment: Difficult to assess due to impaired cognition (Pt unable to follow 1-step commands for ROM or MMT. B UE ROM appears at or near [facility] with pt spontaniously moving through full ROM. Decreased strength observed Bilaterally as pt attempted but  was unable to pull himself up in bed with rails.)   Lower Extremity Assessment Lower Extremity Assessment: Defer to PT evaluation       Communication Communication Communication: Difficulty communicating thoughts/reduced clarity of speech;Difficulty following commands/understanding Following commands:  (unable to follow 1 step commands this session) Cueing Techniques: Verbal cues;Gestural cues;Tactile cues;Visual cues   Cognition Arousal: Alert Behavior During Therapy: Agitated, Anxious, Restless, Impulsive Overall Cognitive Status: Impaired/Different from baseline Area of Impairment: Orientation, Attention, Memory, Following commands, Safety/judgement, Awareness, Problem solving                 Orientation Level: Disoriented to, Place, Time, Situation Current Attention Level: Focused Memory: Decreased short-term memory Following Commands:  (unable to follow 1 step commands this session) Safety/Judgement: Decreased awareness of safety, Decreased awareness of deficits Awareness: Intellectual Problem Solving: Slow processing, Decreased initiation, Difficulty sequencing, Requires verbal cues, Requires tactile cues General Comments: Pt confused and unable to follow 1 step directions this session. Pt perseverating on needing to call his brother, fire dempartment, or police to give him a ride. Pt disoriented to place, situation, and time, stating he is outside, needs to get home, and that it is night time (pt seen in hospital room from 10:03 to 10:20 am with shades open and letting light in).     General Comments       Exercises     Shoulder Instructions      Home Living Family/patient expects to be discharged to:: Skilled nursing facility                                 Additional Comments: Pt presenting from SNF. Per chart review, prior to SNF, pt lived with his brother.      Prior Functioning/Environment Prior Level of Function : Needs assist              Mobility Comments: Per chart review, pt non-ambulatory and w/c bound at baseline. Prior to femur fxin April 2024, pt was able to complete scoot/squat pivot transfers to w/c/BSC without physical assist. ADLs Comments: Per chart review, pt was requiring assistance for all ADLs at SNF, was only spongebathing, and was using briefs due to incontinence. Per chart review, prior left femur fx in April 2024, pt was able to complete all ADLs Independently.        OT Problem List:        OT Treatment/Interventions:      OT Goals(Current goals can be found in the care plan section) Acute Rehab OT Goals Patient Stated Goal: Pt stating he wants to call his brother, fire department, or police to give hime a ride. OT Goal Formulation: Patient unable to participate in goal setting  OT Frequency:      Co-evaluation              AM-PAC OT "6 Clicks" Daily Activity     Outcome Measure Help from another  person eating meals?: Total (NPO) Help from another person taking care of personal grooming?: Total Help from another person toileting, which includes using toliet, bedpan, or urinal?: Total Help from another person bathing (including washing, rinsing, drying)?: Total Help from another person to put on and taking off regular upper body clothing?: Total Help from another person to put on and taking off regular lower body clothing?: Total 6 Click Score: 6   End of Session Nurse Communication: Mobility status;Other (comment) (Pt unable to participate in OT session and not appropriate for skilled OT services at this time.)  Activity Tolerance: Treatment limited secondary to agitation (Patient limited by current cognitive status) Patient left: in bed;with call bell/phone within reach;with bed alarm set  OT Visit Diagnosis: Other symptoms and signs involving cognitive function                Time: 1003-1020 OT Time Calculation (min): 17 min Charges:  OT General Charges $OT Visit: 1 Visit OT  Evaluation $OT Eval Low Complexity: 1 Low  Avary Pitsenbarger "Orson Eva., OTR/L, MA Acute Rehab [phone]   Lendon Colonel 08/12/2023, 11:50 AM

## 2023-08-12 NOTE — Care Management CC44 (Signed)
Condition Code 44 Documentation Completed  Patient Details  Name: [name] MRN: [id number] Date of Birth: 05-27-1956   Condition Code 44 given:  Yes Patient signature on Condition Code 44 notice:  Yes Documentation of 2 MD's agreement:  Yes Code 44 added to claim:  Yes    Leone Haven, RN 08/12/2023, 4:34 PM

## 2023-08-12 NOTE — Progress Notes (Cosign Needed Addendum)
NAME:  [name], MRN:  [id number], DOB:  12/17/1956, LOS: 1 ADMISSION DATE:  08/11/2023, CONSULTATION DATE:  08/12/23  REFERRING MD:  ED , CHIEF COMPLAINT:  cough   History of Present Illness:  67 year old man chronic approximate respiratory failure recent admission required chest tube on the left presents from skilled nursing facility with reported hypoxemia although quickly weaned to baseline 4 L nasal cannula as well as pneumothorax whom we are consulted for evaluation of pneumothorax.  Diagnosed with COVID today.  Been coughing more.  URI symptoms runny nose nasal congestion.  No chest pain.  No worsening shortness of breath etc.  Chest x-ray obtained presumably because of COVID.  Demonstrated pneumothorax.  Repeated chest x-ray here in the ED reveals moderate pneumothorax.  Pertinent  Medical History  Chronic lung disease chronic hypoxemic respiratory failure  Significant Hospital Events: Including procedures, antibiotic start and stop dates in addition to other pertinent events   8/20 presents from facility with reported hypoxemia  Interim History / Subjective:  Chest x-ray demonstrates pneumothorax.  Adequate saturations on room air  Objective   Blood pressure 128/64, pulse 89, temperature (!) 97.5 F (36.4 C), temperature source Oral, resp. rate 19, height 5\' 10"  (1.778 m), weight 75.3 kg, SpO2 100%.        Intake/Output Summary (Last 24 hours) at 08/12/2023 1107 Last data filed at 08/12/2023 1191 Gross per 24 hour  Intake 50 ml  Output 400 ml  Net -350 ml   Filed Weights   08/11/23 0104 08/12/23 0449  Weight: 86 kg 75.3 kg    Examination: Frail cachectic male laying in bed rhonchorous cough No JVD is appreciated Coarse rhonchi bilaterally congested cough Heart sounds are regular Abdomen is scaphoid decreased bowel sounds Extremities cool   Resolved Hospital Problem list     Assessment & Plan:  Secondary spontaneous pneumothorax: Recent coughing.   Reportedly COVID-positive.  Parenchymal lung disease scarring and emphysema. Status post attempted aspiration on 08/11/2023 Chest x-ray 08/12/2023 shows right pneumothorax still in place. Questionable anticoagulation chest tube placement Placing high-flow oxygen  Just changed to comfort care PCCM will sign off   Chronic hypoxemic respite failure: The send parenchymal lung disease described above.  Couple with COVID infection Continue oxygen  Best Practice (right click and "Reselect all SmartList Selections" daily)   Per primary  Labs   CBC: Recent Labs  Lab 08/11/23 0258 08/11/23 0313 08/11/23 0314 08/11/23 0631 08/12/23 0408  WBC 17.4*  --   --  17.0* 11.7*  NEUTROABS 15.9*  --   --   --   --   HGB 8.4* 9.2* 9.9* 7.5* 7.8*  HCT 28.0* 27.0* 29.0* 25.1* 25.7*  MCV 89.5  --   --  89.0 89.2  PLT 461*  --   --  410* 418*    Basic Metabolic Panel: Recent Labs  Lab 08/11/23 0258 08/11/23 0313 08/11/23 0314 08/11/23 0631 08/12/23 0408  NA 139 139 139 138 140  K 4.8 4.9 5.0 4.5 3.8  CL 102  --  104 105 103  CO2 24  --   --  24 26  GLUCOSE 97  --  94 84 92  BUN 38*  --  39* 33* 23  CREATININE 1.07  --  0.90 0.84 1.01  CALCIUM 8.4*  --   --  7.6* 8.1*   GFR: Estimated Creatinine Clearance: 75.3 mL/min (by C-G formula based on SCr of 1.01 mg/dL). Recent Labs  Lab 08/11/23 0258 08/11/23 0314 08/11/23  9811 08/11/23 0631 08/12/23 0408  WBC 17.4*  --   --  17.0* 11.7*  LATICACIDVEN  --  1.2 1.6  --   --     Liver Function Tests: Recent Labs  Lab 08/11/23 0258 08/12/23 0408  AST 35 28  ALT 29 23  ALKPHOS 64 55  BILITOT 0.6 0.8  PROT 6.2* 5.9*  ALBUMIN 1.8* 1.6*   No results for input(s): "LIPASE", "AMYLASE" in the last 168 hours. No results for input(s): "AMMONIA" in the last 168 hours.  ABG    Component Value Date/Time   PHART 7.426 07/17/2020 0648   PCO2ART 29.1 (L) 07/17/2020 0648   PO2ART 79.0 (L) 07/17/2020 0648   HCO3 27.0 08/11/2023 0313    TCO2 26 08/11/2023 0314   ACIDBASEDEF 2.0 09/01/2022 2218   O2SAT 42 08/11/2023 0313     Coagulation Profile: Recent Labs  Lab 08/11/23 0258  INR 1.2    Cardiac Enzymes: No results for input(s): "CKTOTAL", "CKMB", "CKMBINDEX", "TROPONINI" in the last 168 hours.  HbA1C: Hgb A1c MFr Bld  Date/Time Value Ref Range Status  03/27/2023 08:46 PM 5.7 (H) 4.8 - 5.6 % Final    Comment:    (NOTE) Pre diabetes:          5.7%-6.4%  Diabetes:              >6.4%  Glycemic control for   <7.0% adults with diabetes   07/15/2020 03:00 PM 6.9 (H) 4.8 - 5.6 % Final    Comment:    (NOTE) Pre diabetes:          5.7%-6.4%  Diabetes:              >6.4%  Glycemic control for   <7.0% adults with diabetes     CBG: Recent Labs  Lab 08/11/23 1209 08/11/23 1742 08/11/23 2200 08/12/23 0608 08/12/23 0806  GLUCAP 122* 93 96 70 90      Critical care time: n/a    Brett Canales Stryker Veasey ACNP Acute Care Nurse Practitioner [facility] Pulmonary/Critical Care Please consult Amion 08/12/2023, 11:09 AM

## 2023-08-12 NOTE — Care Management Obs Status (Signed)
MEDICARE OBSERVATION STATUS NOTIFICATION   Patient Details  Name: ENDRE FRESCH MRN: [id number] Date of Birth: [birth year]   Medicare Observation Status Notification Given:  Yes    Leone Haven, RN 08/12/2023, 4:34 PM

## 2023-08-12 NOTE — Plan of Care (Signed)
Pt trained and retrained regarding medical equipment. Pt appears to have cognitive impairment as he does not appear to understand education

## 2023-08-12 NOTE — Progress Notes (Signed)
Notified Brett Canales (pulmonary), Monna Fam, and Dr. Maryfrances Bunnell that patient is complaining of severe chest pain per Palliative RN. Orders received for comfort care.

## 2023-08-12 NOTE — Hospital Course (Signed)
#  Sepsis 2/2 aspiration pneumonia #Covid #Pneumothorax #Acute on chronic hypoxic respiratory failure Patient presenting with shortness of breath, Covid positive. Patient has been using 2L supplemental oxygen after last hospital discharge for aspiration pneumonia, now requiring 4L. Leukocytosis to 17.4 with neutrophil predominance. CXR showed a moderate right apical pneumothorax and patchy infiltrate within the right mid and lower lung zone. Suspect pneumothorax is 2/2 coughing from Covid and pneumonia. PCCM consulted and performed needle decompression with moderate improvement of the pneumothorax. Cefepime started for aspiration pneumonia, transitioned to Zosyn for better anaerobic coverage. SLP saw and recommended patient remain NPO given very high aspiration risk.   #Failure to thrive Patient has had multiple ED visits and hospitalizations this year with recurrent episodes of aspiration pneumonia. Patient has a history of functional paraplegia and failure to thrive due to poor nutritional status, limited mobility, chronic nonhealing infected wounds, and other medical comorbidities. He did transition to comfort care at one point but rescinded the orders. Patient was seen by palliative who discussed options such as PEG tube placement, which patient did not want. While seeing the patient today he was extremely confused and disoriented to self and place. Spoke with surrogate decision maker [name] who agreed with decision for hospice care.    #Decubitus ulcer Pt is bedbound and has poor nutritional intake. Ulcer does not appear infected.   #T2DM: Sliding scale insulin  #HTN: home antihypertensives held   #Anxiety: home buspar   #Normocytic anemia: CBC at baseline   #Osteomyelitis: s/p 6 weeks IV abx

## 2023-08-12 NOTE — Plan of Care (Signed)
  Problem: Education: Goal: Knowledge of risk factors and measures for prevention of condition will improve Outcome: Not Progressing   Problem: Coping: Goal: Psychosocial and spiritual needs will be supported Outcome: Not Progressing   Problem: Respiratory: Goal: Will maintain a patent airway Outcome: Not Progressing

## 2023-08-12 NOTE — Progress Notes (Signed)
Attempted to take vital signs. Patient combative, stating repetitively "I will knock the F**k our of you" and trying to hit RN. Attempted to calm patient and assess needs, pain, etc, but patient continues to use profanity and threaten RN.

## 2023-08-12 NOTE — TOC Transition Note (Signed)
Transition of Care [facility]) - CM/SW Discharge Note   Patient Details  Name: [name] MRN: [id number] Date of Birth: Mar 28, 1956  [facility] of [facility]) CM/SW Contact:  Leander Rams, LCSW Phone Number: 08/12/2023, 4:15 PM   Clinical Narrative:    Patient will DC to: [facility] Anticipated DC date: 08/12/2023 Family notified:Bobby  Transport by: Sharin Mons   Per MD patient ready for DC to [facility]. RN, patient, patient's family, and facility notified of DC. Discharge Summary and FL2 sent to facility. RN to call report prior to discharge [phone].. DC packet on chart. Ambulance transport requested for patient.   CSW will sign off for now as social work intervention is no longer needed. Please consult [place] again if new needs arise.    Final next level of care: Hospice Medical Facility Barriers to Discharge: No Barriers Identified   Patient Goals and CMS Choice      Discharge Placement                Patient chooses bed at:  [facility]) Patient to be transferred to facility by: [facility] Name of family member notified: Bobby Patient and family notified of of transfer: 08/12/23  Discharge Plan and Services Additional resources added to the After Visit Summary for                                       Social Determinants of Health (SDOH) Interventions SDOH Screenings   Food Insecurity: No Food Insecurity (06/30/2023)  Recent Concern: Food Insecurity - Food Insecurity Present (06/24/2023)  Housing: Low Risk  (06/30/2023)  Transportation Needs: No Transportation Needs (06/30/2023)  Utilities: Not At Risk (06/30/2023)  Tobacco Use: High Risk (07/28/2023)     Readmission Risk Interventions    06/22/2023    4:22 PM 05/04/2023    1:50 PM  Readmission Risk Prevention Plan  Transportation Screening Complete Complete  PCP or Specialist Appt within 5-7 Days  Complete  PCP or Specialist Appt within 3-5 Days Complete   Home Care Screening  Complete  Medication  Review (RN CM)  Referral to Pharmacy  HRI or Home Care Consult Complete   Social Work Consult for Recovery Care Planning/Counseling Complete   Palliative Care Screening Complete   Medication Review Oceanographer) Complete      Oletta Lamas, MSW, LCSWA, LCASA Transitions of Care  Clinical Social Worker I

## 2023-08-12 NOTE — Progress Notes (Addendum)
Report called to [facility], Charity fundraiser at [facility]. All questions answered. Per LCSW [name]), transport scheduled for 6pm. Dawn, RN requests that PIV be left in at discharge.

## 2023-08-12 NOTE — Progress Notes (Signed)
Subjective:   Summary: [name] is a 67 y.o. year old male currently admitted on the [facility] for Covid, aspiration pneumonia.  Overnight Events: none  Saw patient at bedside this am. Patient was extremely disoriented, repeated asking for an ambulance. Repeatedly grabbed at team's head and equipment and refused to let go. Conversation limited by patient's altered mental status.    Objective:  Vital signs in last 24 hours: Vitals:   08/11/23 2206 08/12/23 0031 08/12/23 0449 08/12/23 0809  BP: 128/82 (!) 102/52 117/60 128/64  Pulse:  96 85 89  Resp: 18 18 (!) 23 19  Temp:  97.9 F (36.6 C) (!) 97.5 F (36.4 C)   TempSrc:  Oral Oral   SpO2:  100% 100% 100%  Weight:   75.3 kg   Height:   5\' 10"  (1.778 m)    Supplemental O2: Nasal Cannula SpO2: 100 % O2 Flow Rate (L/min): 5 L/min   Physical Exam:  Constitutional: chronically ill appearing, in no acute distress Cardiovascular: RRR, no murmurs, rubs or gallops Pulmonary/Chest: normal work of breathing on room air, lungs clear to auscultation bilaterally Abdominal: soft, non-tender, non-distended Skin: warm and dry Extremities: upper/lower extremity pulses 2+, no lower extremity edema present  Filed Weights   08/11/23 0104 08/12/23 0449  Weight: 86 kg 75.3 kg     Intake/Output Summary (Last 24 hours) at 08/12/2023 1010 Last data filed at 08/12/2023 0608 Gross per 24 hour  Intake 50 ml  Output 400 ml  Net -350 ml   Net IO Since Admission: 2,915.13 mL [08/12/23 1010]  Pertinent Labs:    Latest Ref Rng & Units 08/12/2023    4:08 AM 08/11/2023    6:31 AM 08/11/2023    3:14 AM  CBC  WBC 4.0 - 10.5 K/uL 11.7  17.0    Hemoglobin 13.0 - 17.0 g/dL 7.8  7.5  9.9   Hematocrit 39.0 - 52.0 % 25.7  25.1  29.0   Platelets 150 - 400 K/uL 418  410         Latest Ref Rng & Units 08/12/2023    4:08 AM 08/11/2023    6:31 AM 08/11/2023    3:14 AM  CMP  Glucose 70 - 99 mg/dL 92  84  94   BUN 8 - 23 mg/dL  23  33  39   Creatinine 0.61 - 1.24 mg/dL 4.09  8.11  9.14   Sodium 135 - 145 mmol/L 140  138  139   Potassium 3.5 - 5.1 mmol/L 3.8  4.5  5.0   Chloride 98 - 111 mmol/L 103  105  104   CO2 22 - 32 mmol/L 26  24    Calcium 8.9 - 10.3 mg/dL 8.1  7.6    Total Protein 6.5 - 8.1 g/dL 5.9     Total Bilirubin 0.3 - 1.2 mg/dL 0.8     Alkaline Phos 38 - 126 U/L 55     AST 15 - 41 U/L 28     ALT 0 - 44 U/L 23       Imaging: DG Swallowing Func-Speech Pathology  Result Date: 08/11/2023 Blaine Hamper     08/11/2023  5:49 PM Modified Barium Swallow Study Patient Details Name: [name] MRN: [id number] Date of Birth: 01/12/1956 Today's Date: 08/11/2023 HPI/PMH: HPI: Pt is a 67 year old chronically ill male with Pmhx of  HTN, DMII, paraplegia of the lower extremities, protein calorie malnutrition c/b by sarcopenia, Afib, aspiration pneumonia who presented with worsening shortness of breath. He was noted to be COVID positive at the SNF. CXR done showed pt with a pneumothorax and a right middle/lower lobe infiltrate. MBS 07/22/23, mild pharyngeal residual, two instances of flash, trace penetration, no aspiration; full liquids recommended. F/u SLP session recommended regular texture/thin liquids.  Patient discharged to SNF on 07/28/23, but readmitted to hospital 08/11/23 with worsening SOB, Covid+. Clinical Impression: Clinical Impression: Patient's dysphagia appears worsened as compared to most recent MBS on 07/22/23, however it appears similar to MBS completed on 07/17/23. SLP suspects that patient's baseline dysphagia becomes exacerbated when his health deteriorates/declines and currently he has Covid-19 infection. During today's MBS, positioning of patient in chair in radiology suite was challenging and head was tilted back during PO intake. SLP does suspect this had impact on his function as patient with observed cervical osteophytes on previous MBS which were preventing epiglottic inversion. PO's of puree,  thin and nectar thick liquids were administered. With all boluses, majority of barium entered into pyriform sinus but with very little PES opening. This led to moderate-maximal amount of PO residuals as well as sensed penetration and aspiration of all consistencies. Cued dry swallows helped to clear some of residuals but reduced overall only to moderate amount. Cued throat clear was effective to transit penetrate out of laryngeal vestibule, however repeated penetration and aspiration occured from residuals which remained in pyriform sinus. SLP recommending continue NPO status at this time and will follow patient for PO trials and patient/family education. Factors that may increase risk of adverse event in presence of aspiration [name] & [name]): Factors that may increase risk of adverse event in presence of aspiration [name] & [name]): Poor general health and/or compromised immunity; Dependence for feeding and/or oral hygiene; Frail or deconditioned; Limited mobility; Weak cough Recommendations/Plan: Swallowing Evaluation Recommendations Swallowing Evaluation Recommendations Recommendations: NPO Medication Administration: Via alternative means Oral care recommendations: Staff/trained caregiver to provide oral care; Oral care QID (4x/day) Recommended consults: Consider Palliative care Treatment Plan Treatment Plan Treatment recommendations: Therapy as outlined in treatment plan below Follow-up recommendations: Skilled nursing-short term rehab (<3 hours/day) Functional status assessment: Patient has had a recent decline in their functional status and/or demonstrates limited ability to make significant improvements in function in a reasonable and predictable amount of time. Treatment frequency: Min 2x/week Treatment duration: 2 weeks Interventions: Aspiration precaution training; Oropharyngeal exercises; Patient/family education; Trials of upgraded texture/liquids; Respiratory muscle strength training;  Diet toleration management by SLP Recommendations Recommendations for follow up therapy are one component of a multi-disciplinary discharge planning process, led by the attending physician.  Recommendations may be updated based on patient status, additional functional criteria and insurance authorization. Assessment: Orofacial Exam: Orofacial Exam Oral Cavity: Oral Hygiene: WFL Oral Cavity - Dentition: Adequate natural dentition Orofacial Anatomy: WFL Oral Motor/Sensory Function: Generalized oral weakness Anatomy: Anatomy: Presence of cervical hardware; Suspected cervical osteophytes Boluses Administered: Boluses Administered Boluses Administered: Thin liquids (Level 0); Mildly thick liquids (Level 2, nectar thick); Moderately thick liquids (Level 3, honey thick); Puree; Solid  Oral Impairment Domain: Oral Impairment Domain Lip Closure: No labial escape Tongue control during bolus hold: Cohesive bolus between tongue to palatal seal Bolus transport/lingual motion: Brisk tongue motion Oral residue: Trace residue lining oral structures Location of oral residue : Tongue Initiation of pharyngeal swallow : Pyriform sinuses  Pharyngeal Impairment Domain: Pharyngeal Impairment Domain Laryngeal elevation: Complete superior movement of  thyroid cartilage with complete approximation of arytenoids to epiglottic petiole Anterior hyoid excursion: Complete anterior movement Epiglottic movement: Partial inversion Laryngeal vestibule closure: Incomplete, narrow column air/contrast in laryngeal vestibule Pharyngeal stripping wave : Present - diminished Pharyngeal contraction (A/P view only): N/A Pharyngoesophageal segment opening: Minimal distention/minimal duration, marked obstruction of flow Tongue base retraction: Narrow column of contrast or air between tongue base and PPW Pharyngeal residue: Collection of residue within or on pharyngeal structures Location of pharyngeal residue: Valleculae; Pyriform sinuses; Aryepiglottic folds   Esophageal Impairment Domain: Esophageal Impairment Domain Esophageal clearance upright position: Complete clearance, esophageal coating Pill: No data recorded Penetration/Aspiration Scale Score: Penetration/Aspiration Scale Score 7.  Material enters airway, passes BELOW cords and not ejected out despite cough attempt by patient: Puree; Mildly thick liquids (Level 2, nectar thick); Thin liquids (Level 0) Compensatory Strategies: Compensatory Strategies Compensatory strategies: No   General Information: Caregiver present: No  Diet Prior to this Study: NPO   Temperature : Normal   Respiratory Status: WFL   Supplemental O2: Nasal cannula   History of Recent Intubation: No  Behavior/Cognition: Alert; Cooperative; Pleasant mood Self-Feeding Abilities: Dependent for feeding Baseline vocal quality/speech: Normal Volitional Cough: Able to elicit Volitional Swallow: Able to elicit Exam Limitations: Poor positioning Goal Planning: Prognosis for improved oropharyngeal function: Fair Barriers to Reach Goals: Time post onset; Severity of deficits No data recorded No data recorded Consulted and agree with results and recommendations: Patient Pain: Pain Assessment Pain Assessment: Faces Faces Pain Scale: 2 Pain Location: legs Pain Descriptors / Indicators: Discomfort Pain Intervention(s): Limited activity within patient's tolerance End of Session: Start Time:SLP Start Time (ACUTE ONLY): 1520 Stop Time: SLP Stop Time (ACUTE ONLY): 1540 Time Calculation:SLP Time Calculation (min) (ACUTE ONLY): 20 min Charges: SLP Evaluations $ SLP Speech Visit: 1 Visit SLP Evaluations $BSS Swallow: 1 Procedure $MBS Swallow: 1 Procedure SLP visit diagnosis: SLP Visit Diagnosis: Dysphagia, pharyngeal phase (R13.13) Past Medical History: Past Medical History: Diagnosis Date  Cellulitis 07/17/2020  LEFT HIP  Diabetes mellitus   Dyspnea   Hyperlipidemia   Hypertension  Past Surgical History: Past Surgical History: Procedure Laterality Date  BIOPSY   05/01/2023  Procedure: BIOPSY;  Surgeon: Beverley Fiedler, MD;  Location: [facility] ENDOSCOPY;  Service: Gastroenterology;;  ESOPHAGOGASTRODUODENOSCOPY (EGD) WITH PROPOFOL N/A 05/01/2023  Procedure: ESOPHAGOGASTRODUODENOSCOPY (EGD) WITH PROPOFOL;  Surgeon: Beverley Fiedler, MD;  Location: [facility] ENDOSCOPY;  Service: Gastroenterology;  Laterality: N/A;  FEMUR IM NAIL Left 03/27/2023  Procedure: INTRAMEDULLARY (IM) RETROGRADE FEMORAL NAILING;  Surgeon: Myrene Galas, MD;  Location: [facility] OR;  Service: Orthopedics;  Laterality: Left;  SPINE SURGERY   Angela Nevin, MA, CCC-SLP Speech Therapy     Assessment/Plan:   Principal Problem:   Acute hypoxic respiratory failure (HCC) Active Problems:   Severe protein-calorie malnutrition (HCC)   Aspiration pneumonia (HCC)   Secondary spontaneous pneumothorax   COVID   Diabetes mellitus (HCC)   Primary spontaneous pneumothorax   Agitation   Pressure injury of skin   Patient Summary: [name] is a 67 y.o. with a pertinent PMH of T2DM, HTN, Afib, paraplegia, severe deconditioning/malnutrition who presented with shortness of breath and admitted for Covid, aspiration pneumonia, pneumothorax.   #Sepsis 2/2 aspiration pneumonia #Covid #Pneumothorax #Acute on chronic hypoxic respiratory failure Patient presenting with shortness of breath, Covid positive. Patient has been using 2L supplemental oxygen after last hospital discharge for aspiration pneumonia, now requiring 4L. Leukocytosis to 17.4 with neutrophil predominance. CXR showed a moderate right apical pneumothorax and patchy infiltrate within  the right mid and lower lung zone. Suspect pneumothorax is 2/2 coughing from Covid and pneumonia. PCCM consulted and performed needle decompression with moderate improvement of the pneumothorax. Cefepime started for aspiration pneumonia, transitioned to Zosyn for better anaerobic coverage.  -serial CXR to ensure resolution of pneumothorax -Zosyn for aspiration  pneumonia -Remdesivir for Covid -IV fluids per sepsis protocol -SLP recommending patient remain NPO after swallow study  #Failure to thrive Patient has had multiple ED visits and hospitalizations this year with recurrent episodes of aspiration of pneumonia. He did transition to comfort care at one point but rescinded the orders. May need PEG tube placement and chronic NPO status -Seen by palliative. Recommending hospice care. Patient does not want PEG tube despite extremely high aspiration risk -Will speak with patient's brother [name] about plan going forward -IV D50 infusion increased to 137mL/hr  #Decubitus ulcer Pt is bedbound and has poor nutritional intake. Ulcer does not appear infected.  -wound consult ordered -sacral foam, q2hr turns -IV dilaudid 0.5mg  q6hrs  #T2DM -On SSI  #HTN -currently holding home antihypertensives. Can use IV prn meds  #Anxiety -chronic. Home buspar, hydroxyzine held now.   #Normocytic anemia -chronic. CBC at baseline around 8.4. Will recommend oral iron at discharge  #Osteomyelitis -s/p 6 weeks IV abx   Diet: NPO pending SLP eval IVF: none VTE: Lovenox Code: DNR  Dispo: Anticipated discharge to Skilled nursing facility pending medical stability  Monna Fam, MD PGY-1 Internal Medicine Resident Pager Number [phone] Please contact the on call pager after 5 pm and on weekends at [phone].

## 2023-08-12 NOTE — Discharge Summary (Addendum)
Name: [name] MRN: [id number] DOB: 1956-05-16 67 y.o. PCP: Pcp, No  Date of Admission: 08/11/2023 12:40 AM Date of Discharge: 08/12/23 Attending Physician: Dr. Ninetta Lights  Discharge Diagnosis: Principal Problem:   Acute hypoxic respiratory failure (HCC) Active Problems:   Severe protein-calorie malnutrition (HCC)   Aspiration pneumonia (HCC)   Secondary spontaneous pneumothorax   COVID   Diabetes mellitus (HCC)   Primary spontaneous pneumothorax   Agitation   Pressure injury of skin    Discharge Medications: Allergies as of 08/12/2023       Reactions   Lipitor [atorvastatin Calcium] Other (See Comments)   Myalgias         Medication List     STOP taking these medications    acetaminophen 160 MG/5ML solution Commonly known as: TYLENOL Replaced by: acetaminophen 325 MG tablet   busPIRone 5 MG tablet Commonly known as: BUSPAR   doxycycline 100 MG tablet Commonly known as: VIBRA-TABS   feeding supplement Liqd   gabapentin 800 MG tablet Commonly known as: NEURONTIN   gentamicin ointment 0.1 % Commonly known as: GARAMYCIN   HYDROmorphone 2 MG tablet Commonly known as: DILAUDID Replaced by: HYDROmorphone 1 MG/ML injection   hydrOXYzine 25 MG tablet Commonly known as: ATARAX   insulin aspart 100 UNIT/ML injection Commonly known as: novoLOG   insulin glargine-yfgn 100 UNIT/ML injection Commonly known as: SEMGLEE   ipratropium 0.02 % nebulizer solution Commonly known as: ATROVENT   lidocaine 5 % Commonly known as: LIDODERM   LORazepam 0.5 MG tablet Commonly known as: Ativan Replaced by: LORazepam 2 MG/ML injection   losartan 25 MG tablet Commonly known as: COZAAR   metroNIDAZOLE 500 MG tablet Commonly known as: FLAGYL   mirtazapine 15 MG disintegrating tablet Commonly known as: REMERON SOL-TAB   mouth rinse Liqd solution Replaced by: antiseptic oral rinse Liqd   multivitamin with minerals Tabs tablet   oxyCODONE 5 MG immediate  release tablet Commonly known as: Oxy IR/ROXICODONE   pantoprazole 40 MG tablet Commonly known as: PROTONIX   senna-docusate 8.6-50 MG tablet Commonly known as: Senokot-S   thiamine 100 MG tablet Commonly known as: Vitamin B-1   VITAMIN C PO       TAKE these medications    acetaminophen 325 MG tablet Commonly known as: TYLENOL Take 2 tablets (650 mg total) by mouth every 6 (six) hours as needed for mild pain (or Fever >/= 101). Replaces: acetaminophen 160 MG/5ML solution   antiseptic oral rinse Liqd Apply 15 mLs topically as needed for dry mouth. Replaces: mouth rinse Liqd solution   collagenase 250 UNIT/GM ointment Commonly known as: SANTYL Apply topically daily. Start taking on: August 13, 2023   glycopyrrolate 0.2 MG/ML injection Commonly known as: ROBINUL Inject 1 mL (0.2 mg total) into the vein every 4 (four) hours as needed (excessive secretions).   glycopyrrolate 0.2 MG/ML injection Commonly known as: ROBINUL Inject 1 mL (0.2 mg total) into the skin every 4 (four) hours as needed (excessive secretions).   guaiFENesin 100 MG/5ML liquid Commonly known as: ROBITUSSIN Take 5 mLs by mouth every 4 (four) hours as needed for cough or to loosen phlegm.   HYDROmorphone 1 MG/ML injection Commonly known as: DILAUDID Inject 0.5-1 mLs (0.5-1 mg total) into the vein every hour as needed (Dyspnea/Pain). Replaces: HYDROmorphone 2 MG tablet   HYDROmorphone 1 MG/ML injection Commonly known as: DILAUDID Inject 1 mL (1 mg total) into the vein every 4 (four) hours as needed for moderate pain or severe pain.  HYDROmorphone 1 MG/ML injection Commonly known as: DILAUDID Inject 1 mL (1 mg total) into the vein every 4 (four) hours.   LORazepam 2 MG/ML injection Commonly known as: ATIVAN Inject 0.25 mLs (0.5 mg total) into the vein every 4 (four) hours. Replaces: LORazepam 0.5 MG tablet   LORazepam 2 MG/ML injection Commonly known as: ATIVAN Inject 0.25-0.5 mLs (0.5-1 mg  total) into the vein every hour as needed for anxiety, seizure or sedation.   ondansetron 4 MG disintegrating tablet Commonly known as: ZOFRAN-ODT Take 1 tablet (4 mg total) by mouth every 6 (six) hours as needed for nausea.   ondansetron 4 MG/2ML Soln injection Commonly known as: ZOFRAN Inject 2 mLs (4 mg total) into the vein every 6 (six) hours as needed for nausea.   polyvinyl alcohol 1.4 % ophthalmic solution Commonly known as: LIQUIFILM TEARS Place 1 drop into both eyes 4 (four) times daily as needed for dry eyes.        Disposition and follow-up:   [name] was discharged from [facility] to [facility] hospice care in Serious condition.    Hospital Course by problem list:  #Sepsis 2/2 aspiration pneumonia #Covid #Pneumothorax #Acute on chronic hypoxic respiratory failure Patient presenting with shortness of breath, Covid positive. Patient has been using 2L supplemental oxygen after last hospital discharge for aspiration pneumonia, now requiring 4L. Leukocytosis to 17.4 with neutrophil predominance. CXR showed a moderate right apical pneumothorax and patchy infiltrate within the right mid and lower lung zone. Suspect pneumothorax is 2/2 coughing from Covid and pneumonia. PCCM consulted and performed needle decompression with moderate improvement of the pneumothorax. Cefepime started for aspiration pneumonia, transitioned to Zosyn for better anaerobic coverage. SLP saw and recommended patient remain NPO given very high aspiration risk.   #Failure to thrive Patient has had multiple ED visits and hospitalizations this year with recurrent episodes of aspiration pneumonia. Patient has a history of functional paraplegia and failure to thrive due to poor nutritional status, limited mobility, chronic nonhealing infected wounds, and other medical comorbidities. He did transition to comfort care at one point but rescinded the orders. Patient was seen by palliative who  discussed options such as PEG tube placement, which patient did not want. While seeing the patient today he was extremely confused and disoriented to self and place. Spoke with surrogate decision maker [name] who agreed with decision for hospice care.    #Decubitus ulcer Pt is bedbound and has poor nutritional intake. Ulcer does not appear infected.   #T2DM: Sliding scale insulin  #HTN: home antihypertensives held   #Anxiety: home buspar   #Normocytic anemia: CBC at baseline   #Osteomyelitis: s/p 6 weeks IV abx   Discharge Subjective: Saw patient at bedside this am. Patient was extremely disoriented, repeated asking for an ambulance. Repeatedly grabbed at team's head and equipment and refused to let go. Conversation limited by patient's altered mental status.    Discharge Exam:   BP (!) 155/71 (BP Location: Right Arm)   Pulse 90   Temp (!) 97.5 F (36.4 C) (Oral)   Resp 20   Ht 5\' 10"  (1.778 m)   Wt 75.3 kg   SpO2 94%   BMI 23.82 kg/m  Constitutional: ill-appearing, altered mental status HENT: normocephalic atraumatic, mucous membranes moist Eyes: conjunctiva non-erythematous Neck: supple Cardiovascular: regular rate and rhythm, no m/r/g Pulmonary/Chest: normal work of breathing on room air, lungs clear to auscultation bilaterally Abdominal: soft, non-tender, non-distended MSK: normal bulk and tone Neurological: alert &  oriented x 3, 5/5 strength in bilateral upper and lower extremities, normal gait Skin: warm and dry  Pertinent Labs, Studies, and Procedures:     Latest Ref Rng & Units 08/12/2023    4:08 AM 08/11/2023    6:31 AM 08/11/2023    3:14 AM  CBC  WBC 4.0 - 10.5 K/uL 11.7  17.0    Hemoglobin 13.0 - 17.0 g/dL 7.8  7.5  9.9   Hematocrit 39.0 - 52.0 % 25.7  25.1  29.0   Platelets 150 - 400 K/uL 418  410         Latest Ref Rng & Units 08/12/2023    4:08 AM 08/11/2023    6:31 AM 08/11/2023    3:14 AM  CMP  Glucose 70 - 99 mg/dL 92  84  94   BUN 8 - 23 mg/dL 23   33  39   Creatinine 0.61 - 1.24 mg/dL 1.61  0.96  0.45   Sodium 135 - 145 mmol/L 140  138  139   Potassium 3.5 - 5.1 mmol/L 3.8  4.5  5.0   Chloride 98 - 111 mmol/L 103  105  104   CO2 22 - 32 mmol/L 26  24    Calcium 8.9 - 10.3 mg/dL 8.1  7.6    Total Protein 6.5 - 8.1 g/dL 5.9     Total Bilirubin 0.3 - 1.2 mg/dL 0.8     Alkaline Phos 38 - 126 U/L 55     AST 15 - 41 U/L 28     ALT 0 - 44 U/L 23       DG CHEST PORT 1 VIEW  Result Date: 08/12/2023 CLINICAL DATA:  Pneumothorax.  Shortness of breath. EXAM: PORTABLE CHEST 1 VIEW COMPARISON:  August 11, 2023. FINDINGS: Stable cardiomegaly. Right apical pneumothorax is noted which is slightly increased compared to prior exam. Stable bibasilar opacities concerning for edema or atelectasis. Bony thorax is unremarkable. IMPRESSION: Mildly increased right apical pneumothorax is noted compared to prior exam. Stable bibasilar opacities as noted above. Electronically Signed   By: Lupita Raider M.D.   On: 08/12/2023 12:14   DG Swallowing Func-Speech Pathology  Result Date: 08/11/2023 Blaine Hamper     08/11/2023  5:49 PM Modified Barium Swallow Study Patient Details Name: [name] MRN: [id number] Date of Birth: 17-Nov-1956 Today's Date: 08/11/2023 HPI/PMH: HPI: Pt is a 67 year old chronically ill male with Pmhx of HTN, DMII, paraplegia of the lower extremities, protein calorie malnutrition c/b by sarcopenia, Afib, aspiration pneumonia who presented with worsening shortness of breath. He was noted to be COVID positive at the SNF. CXR done showed pt with a pneumothorax and a right middle/lower lobe infiltrate. MBS 07/22/23, mild pharyngeal residual, two instances of flash, trace penetration, no aspiration; full liquids recommended. F/u SLP session recommended regular texture/thin liquids.  Patient discharged to SNF on 07/28/23, but readmitted to hospital 08/11/23 with worsening SOB, Covid+. Clinical Impression: Clinical Impression: Patient's  dysphagia appears worsened as compared to most recent MBS on 07/22/23, however it appears similar to MBS completed on 07/17/23. SLP suspects that patient's baseline dysphagia becomes exacerbated when his health deteriorates/declines and currently he has Covid-19 infection. During today's MBS, positioning of patient in chair in radiology suite was challenging and head was tilted back during PO intake. SLP does suspect this had impact on his function as patient with observed cervical osteophytes on previous MBS which were preventing epiglottic inversion. PO's of puree, thin  and nectar thick liquids were administered. With all boluses, majority of barium entered into pyriform sinus but with very little PES opening. This led to moderate-maximal amount of PO residuals as well as sensed penetration and aspiration of all consistencies. Cued dry swallows helped to clear some of residuals but reduced overall only to moderate amount. Cued throat clear was effective to transit penetrate out of laryngeal vestibule, however repeated penetration and aspiration occured from residuals which remained in pyriform sinus. SLP recommending continue NPO status at this time and will follow patient for PO trials and patient/family education. Factors that may increase risk of adverse event in presence of aspiration [name] & [name]): Factors that may increase risk of adverse event in presence of aspiration [name] & [name]): Poor general health and/or compromised immunity; Dependence for feeding and/or oral hygiene; Frail or deconditioned; Limited mobility; Weak cough Recommendations/Plan: Swallowing Evaluation Recommendations Swallowing Evaluation Recommendations Recommendations: NPO Medication Administration: Via alternative means Oral care recommendations: Staff/trained caregiver to provide oral care; Oral care QID (4x/day) Recommended consults: Consider Palliative care Treatment Plan Treatment Plan Treatment recommendations:  Therapy as outlined in treatment plan below Follow-up recommendations: Skilled nursing-short term rehab (<3 hours/day) Functional status assessment: Patient has had a recent decline in their functional status and/or demonstrates limited ability to make significant improvements in function in a reasonable and predictable amount of time. Treatment frequency: Min 2x/week Treatment duration: 2 weeks Interventions: Aspiration precaution training; Oropharyngeal exercises; Patient/family education; Trials of upgraded texture/liquids; Respiratory muscle strength training; Diet toleration management by SLP Recommendations Recommendations for follow up therapy are one component of a multi-disciplinary discharge planning process, led by the attending physician.  Recommendations may be updated based on patient status, additional functional criteria and insurance authorization. Assessment: Orofacial Exam: Orofacial Exam Oral Cavity: Oral Hygiene: WFL Oral Cavity - Dentition: Adequate natural dentition Orofacial Anatomy: WFL Oral Motor/Sensory Function: Generalized oral weakness Anatomy: Anatomy: Presence of cervical hardware; Suspected cervical osteophytes Boluses Administered: Boluses Administered Boluses Administered: Thin liquids (Level 0); Mildly thick liquids (Level 2, nectar thick); Moderately thick liquids (Level 3, honey thick); Puree; Solid  Oral Impairment Domain: Oral Impairment Domain Lip Closure: No labial escape Tongue control during bolus hold: Cohesive bolus between tongue to palatal seal Bolus transport/lingual motion: Brisk tongue motion Oral residue: Trace residue lining oral structures Location of oral residue : Tongue Initiation of pharyngeal swallow : Pyriform sinuses  Pharyngeal Impairment Domain: Pharyngeal Impairment Domain Laryngeal elevation: Complete superior movement of thyroid cartilage with complete approximation of arytenoids to epiglottic petiole Anterior hyoid excursion: Complete anterior  movement Epiglottic movement: Partial inversion Laryngeal vestibule closure: Incomplete, narrow column air/contrast in laryngeal vestibule Pharyngeal stripping wave : Present - diminished Pharyngeal contraction (A/P view only): N/A Pharyngoesophageal segment opening: Minimal distention/minimal duration, marked obstruction of flow Tongue base retraction: Narrow column of contrast or air between tongue base and PPW Pharyngeal residue: Collection of residue within or on pharyngeal structures Location of pharyngeal residue: Valleculae; Pyriform sinuses; Aryepiglottic folds  Esophageal Impairment Domain: Esophageal Impairment Domain Esophageal clearance upright position: Complete clearance, esophageal coating Pill: No data recorded Penetration/Aspiration Scale Score: Penetration/Aspiration Scale Score 7.  Material enters airway, passes BELOW cords and not ejected out despite cough attempt by patient: Puree; Mildly thick liquids (Level 2, nectar thick); Thin liquids (Level 0) Compensatory Strategies: Compensatory Strategies Compensatory strategies: No   General Information: Caregiver present: No  Diet Prior to this Study: NPO   Temperature : Normal   Respiratory Status: WFL   Supplemental O2: Nasal  cannula   History of Recent Intubation: No  Behavior/Cognition: Alert; Cooperative; Pleasant mood Self-Feeding Abilities: Dependent for feeding Baseline vocal quality/speech: Normal Volitional Cough: Able to elicit Volitional Swallow: Able to elicit Exam Limitations: Poor positioning Goal Planning: Prognosis for improved oropharyngeal function: Fair Barriers to Reach Goals: Time post onset; Severity of deficits No data recorded No data recorded Consulted and agree with results and recommendations: Patient Pain: Pain Assessment Pain Assessment: Faces Faces Pain Scale: 2 Pain Location: legs Pain Descriptors / Indicators: Discomfort Pain Intervention(s): Limited activity within patient's tolerance End of Session: Start Time:SLP  Start Time (ACUTE ONLY): 1520 Stop Time: SLP Stop Time (ACUTE ONLY): 1540 Time Calculation:SLP Time Calculation (min) (ACUTE ONLY): 20 min Charges: SLP Evaluations $ SLP Speech Visit: 1 Visit SLP Evaluations $BSS Swallow: 1 Procedure $MBS Swallow: 1 Procedure SLP visit diagnosis: SLP Visit Diagnosis: Dysphagia, pharyngeal phase (R13.13) Past Medical History: Past Medical History: Diagnosis Date  Cellulitis 07/17/2020  LEFT HIP  Diabetes mellitus   Dyspnea   Hyperlipidemia   Hypertension  Past Surgical History: Past Surgical History: Procedure Laterality Date  BIOPSY  05/01/2023  Procedure: BIOPSY;  Surgeon: Beverley Fiedler, MD;  Location: [facility] ENDOSCOPY;  Service: Gastroenterology;;  ESOPHAGOGASTRODUODENOSCOPY (EGD) WITH PROPOFOL N/A 05/01/2023  Procedure: ESOPHAGOGASTRODUODENOSCOPY (EGD) WITH PROPOFOL;  Surgeon: Beverley Fiedler, MD;  Location: [facility] ENDOSCOPY;  Service: Gastroenterology;  Laterality: N/A;  FEMUR IM NAIL Left 03/27/2023  Procedure: INTRAMEDULLARY (IM) RETROGRADE FEMORAL NAILING;  Surgeon: Myrene Galas, MD;  Location: [facility] OR;  Service: Orthopedics;  Laterality: Left;  SPINE SURGERY   Angela Nevin, MA, CCC-SLP Speech Therapy   DG CHEST PORT 1 VIEW  Result Date: 08/11/2023 CLINICAL DATA:  Pneumothorax follow-up EXAM: PORTABLE CHEST 1 VIEW COMPARISON:  Prior chest x-ray obtained earlier today at 2:52 a.m. FINDINGS: Slight interval progression of right apical pneumothorax with 2.8 cm displacement compared to 1.6 cm earlier this morning. Increasing pulmonary vascular congestion and interstitial prominence suggest developing pulmonary edema. Cardiomegaly. Increasing bibasilar airspace opacities likely reflect atelectasis. IMPRESSION: 1. Slight interval increase in right apical pneumothorax compared to earlier this morning. 2. Developing pulmonary edema and bibasilar atelectasis. Electronically Signed   By: Malachy Moan M.D.   On: 08/11/2023 09:28   DG Chest Portable 1 View  Result Date:  08/11/2023 CLINICAL DATA:  Follow-up pneumothorax. Shortness of breath. Dr. Attempted to aspirate pneumothorax. EXAM: PORTABLE CHEST 1 VIEW COMPARISON:  Radiograph 08/11/2023 at 1:11 a.m. FINDINGS: Slightly decreased size of the small to moderate right apical and lateral pneumothorax. Stable enlargement of the cardiomediastinal silhouette. Persistent infiltrates in the right upper and lower lung. No pleural effusion. Chronic interstitial coarsening. No displaced rib fractures. IMPRESSION: Slight decrease in size of the right small-moderate pneumothorax. Electronically Signed   By: Minerva Fester M.D.   On: 08/11/2023 03:19   DG Chest Port 1 View  Addendum Date: 08/11/2023   ADDENDUM REPORT: 08/11/2023 01:45 ADDENDUM: These results were called by telephone at the time of interpretation on 08/11/2023 at 1:45 am to provider Glynn Octave , who verbally acknowledged these results. l Electronically Signed   By: Helyn Numbers M.D.   On: 08/11/2023 01:45   Result Date: 08/11/2023 CLINICAL DATA:  Dyspnea, sepsis EXAM: PORTABLE CHEST 1 VIEW COMPARISON:  07/24/2023 FINDINGS: The lungs are hyperinflated in keeping with changes of underlying COPD. Moderate right pneumothorax has developed. No radiographic evidence of tension. Extensive interstitial infiltrate within the right upper lobe appears improved in the interval since prior examination. Patchy infiltrate within the  right mid lung zone right lung base persist. No pleural effusion. No pneumothorax on the left. Cardiac size is within normal limits. IMPRESSION: 1. Moderate right pneumothorax. No radiographic evidence of tension physiology. 2. COPD. 3. Improving right upper lobe infiltrate. Persistent patchy pneumonic infiltrate within the right mid and lower lung zone. Electronically Signed: By: Helyn Numbers M.D. On: 08/11/2023 01:37     Signed: Modena Slater, DO PGY-2 08/12/2023, 3:41 PM   Pager: [phone]

## 2023-08-12 NOTE — Progress Notes (Signed)
RT attempted to give pt morning breathing tx. Pt became combative ripping off breathing tx mask along with this RTs pulse ox on RTs shirt.

## 2023-08-12 NOTE — Discharge Instructions (Addendum)
Mr Carraher:   Thank you for allowing [place] to be part of your care. I wish you and your family the very best.   Thank you, Dr. Allena Katz

## 2023-08-16 LAB — CULTURE, BLOOD (ROUTINE X 2)
Culture: NO GROWTH
Culture: NO GROWTH
Special Requests: ADEQUATE

## 2023-08-27 ENCOUNTER — Other Ambulatory Visit: Payer: Self-pay
